# Patient Record
Sex: Female | Born: 1950
Health system: Southern US, Community
[De-identification: ages and names within clinical notes are randomized; demographics above are authoritative.]

## PROBLEM LIST (undated history)

## (undated) DIAGNOSIS — E785 Hyperlipidemia, unspecified: Secondary | ICD-10-CM

## (undated) DIAGNOSIS — Z860100 Personal history of colon polyps, unspecified: Secondary | ICD-10-CM

## (undated) DIAGNOSIS — K56609 Unspecified intestinal obstruction, unspecified as to partial versus complete obstruction: Secondary | ICD-10-CM

## (undated) DIAGNOSIS — Z95810 Presence of automatic (implantable) cardiac defibrillator: Secondary | ICD-10-CM

## (undated) DIAGNOSIS — R0602 Shortness of breath: Secondary | ICD-10-CM

## (undated) DIAGNOSIS — I480 Paroxysmal atrial fibrillation: Secondary | ICD-10-CM

## (undated) DIAGNOSIS — G709 Myoneural disorder, unspecified: Secondary | ICD-10-CM

## (undated) DIAGNOSIS — I10 Essential (primary) hypertension: Secondary | ICD-10-CM

## (undated) DIAGNOSIS — I2510 Atherosclerotic heart disease of native coronary artery without angina pectoris: Secondary | ICD-10-CM

## (undated) DIAGNOSIS — I447 Left bundle-branch block, unspecified: Secondary | ICD-10-CM

## (undated) DIAGNOSIS — I255 Ischemic cardiomyopathy: Secondary | ICD-10-CM

## (undated) DIAGNOSIS — C50912 Malignant neoplasm of unspecified site of left female breast: Secondary | ICD-10-CM

## (undated) DIAGNOSIS — E119 Type 2 diabetes mellitus without complications: Secondary | ICD-10-CM

## (undated) HISTORY — DX: Personal history of colon polyps, unspecified: Z86.0100

## (undated) HISTORY — PX: HERNIA REPAIR: SHX51

## (undated) HISTORY — DX: Type 2 diabetes mellitus without complications: E11.9

## (undated) HISTORY — DX: Personal history of colonic polyps: Z86.010

## (undated) HISTORY — DX: Hyperlipidemia, unspecified: E78.5

## (undated) HISTORY — DX: Myoneural disorder, unspecified: G70.9

## (undated) HISTORY — PX: TENDON REPAIR: SHX5111

## (undated) HISTORY — DX: Essential (primary) hypertension: I10

## (undated) HISTORY — PX: CARDIAC CATHETERIZATION: SHX172

---

## 1972-03-11 HISTORY — PX: CHOLECYSTECTOMY OPEN: SUR202

## 1972-03-11 HISTORY — PX: APPENDECTOMY: SHX54

## 1981-03-11 HISTORY — PX: DILATION AND CURETTAGE OF UTERUS: SHX78

## 1985-03-11 HISTORY — PX: TUBAL LIGATION: SHX77

## 1998-03-11 HISTORY — PX: ABDOMINAL HYSTERECTOMY: SHX81

## 1998-03-11 HISTORY — PX: INCONTINENCE SURGERY: SHX676

## 2000-03-11 DIAGNOSIS — C50912 Malignant neoplasm of unspecified site of left female breast: Secondary | ICD-10-CM

## 2000-03-11 HISTORY — PX: MASTECTOMY: SHX3

## 2000-03-11 HISTORY — DX: Malignant neoplasm of unspecified site of left female breast: C50.912

## 2000-03-11 HISTORY — PX: BREAST BIOPSY: SHX20

## 2000-03-11 HISTORY — PX: MASTECTOMY MODIFIED RADICAL W/ AXILLARY LYMPH NODES W/ OR W/O PECTORALIS MINOR: SUR850

## 2008-08-19 LAB — HM DIABETES FOOT EXAM: HM Diabetic Foot Exam: NORMAL

## 2009-09-19 ENCOUNTER — Ambulatory Visit: Payer: Self-pay | Admitting: Family Medicine

## 2009-09-19 DIAGNOSIS — I10 Essential (primary) hypertension: Secondary | ICD-10-CM | POA: Insufficient documentation

## 2009-09-19 DIAGNOSIS — E785 Hyperlipidemia, unspecified: Secondary | ICD-10-CM | POA: Insufficient documentation

## 2009-09-19 DIAGNOSIS — R51 Headache: Secondary | ICD-10-CM | POA: Insufficient documentation

## 2009-09-19 DIAGNOSIS — Z853 Personal history of malignant neoplasm of breast: Secondary | ICD-10-CM

## 2009-09-19 DIAGNOSIS — I252 Old myocardial infarction: Secondary | ICD-10-CM | POA: Insufficient documentation

## 2009-09-19 DIAGNOSIS — E119 Type 2 diabetes mellitus without complications: Secondary | ICD-10-CM | POA: Insufficient documentation

## 2009-09-19 DIAGNOSIS — R519 Headache, unspecified: Secondary | ICD-10-CM | POA: Insufficient documentation

## 2009-09-19 DIAGNOSIS — J309 Allergic rhinitis, unspecified: Secondary | ICD-10-CM | POA: Insufficient documentation

## 2009-09-19 HISTORY — DX: Personal history of malignant neoplasm of breast: Z85.3

## 2009-09-19 HISTORY — DX: Old myocardial infarction: I25.2

## 2009-09-19 LAB — CONVERTED CEMR LAB
Albumin: 4.1 g/dL (ref 3.5–5.2)
BUN: 10 mg/dL (ref 6–23)
CO2: 32 meq/L (ref 19–32)
Calcium: 10.2 mg/dL (ref 8.4–10.5)
Cholesterol: 231 mg/dL — ABNORMAL HIGH (ref 0–200)
Creatinine, Ser: 0.3 mg/dL — ABNORMAL LOW (ref 0.4–1.2)
Creatinine,U: 27.1 mg/dL
Eosinophils Relative: 0.6 % (ref 0.0–5.0)
GFR calc non Af Amer: 210.52 mL/min (ref >60)
Glucose, Bld: 233 mg/dL — ABNORMAL HIGH (ref 70–99)
HCT: 39.5 % (ref 36.0–46.0)
Hemoglobin: 13.7 g/dL (ref 12.0–15.0)
Hgb A1c MFr Bld: 11.2 % — ABNORMAL HIGH (ref 4.6–6.5)
Lymphs Abs: 2.4 10*3/uL (ref 0.7–4.0)
MCV: 91.2 fL (ref 78.0–100.0)
Microalb, Ur: 1.2 mg/dL (ref 0.0–1.9)
Monocytes Absolute: 0.4 10*3/uL (ref 0.1–1.0)
Monocytes Relative: 4.4 % (ref 3.0–12.0)
Neutro Abs: 6.1 10*3/uL (ref 1.4–7.7)
Platelets: 204 10*3/uL (ref 150.0–400.0)
TSH: 0.67 microintl units/mL (ref 0.35–5.50)
Total CHOL/HDL Ratio: 5
Total Protein: 7.1 g/dL (ref 6.0–8.3)
Triglycerides: 372 mg/dL — ABNORMAL HIGH (ref 0.0–149.0)
WBC: 8.9 10*3/uL (ref 4.5–10.5)

## 2009-09-22 ENCOUNTER — Ambulatory Visit: Payer: Self-pay | Admitting: Family Medicine

## 2009-09-26 ENCOUNTER — Telehealth: Payer: Self-pay | Admitting: Family Medicine

## 2009-10-11 ENCOUNTER — Ambulatory Visit: Payer: Self-pay | Admitting: Family Medicine

## 2009-10-23 ENCOUNTER — Telehealth: Payer: Self-pay | Admitting: Family Medicine

## 2010-04-10 NOTE — Progress Notes (Signed)
Summary: test strips  Phone Note Call from Patient   Summary of Call: patient is calling for a refill of her glucometer test strips. Initial call taken by: Kern Reap CMA (AAMA),  October 23, 2009 11:45 AM    New/Updated Medications: ONETOUCH ULTRA TEST  STRP (GLUCOSE BLOOD) use once daily for glucose control Prescriptions: ONETOUCH ULTRA TEST  STRP (GLUCOSE BLOOD) use once daily for glucose control  #100 x 3   Entered by:   Kern Reap CMA (AAMA)   Authorized by:   Roderick Pee MD   Signed by:   Kern Reap CMA (AAMA) on 10/23/2009   Method used:   [facility]        [facility] [address] [phone]* (retail)       [address]       [place], [place]  [address]       Ph: [phone] or [phone]       Fax: [phone]   RxID:   [id number]

## 2010-04-10 NOTE — Assessment & Plan Note (Signed)
Summary: NEW TO EST//CCM   Vital Signs:  Patient profile:   60 year old female Menstrual status:  hysterectomy Height:      63 inches Weight:      150 pounds BMI:     26.67 Temp:     99.4 degrees F oral BP sitting:   140 / 90 Cuff size:   large  Vitals Entered By: Kern Reap CMA Duncan Dull) (September 19, 2009 2:09 PM) CC: new to establish Is Patient Diabetic? Yes Pain Assessment Patient in pain? no      Comments blood pressure should be taken in leg because of bilateral mascetomy     Menstrual Status hysterectomy   CC:  new to establish.  History of Present Illness: [name] is a 60 year old, married female, smoker, who has underlying coronary disease, MI, 2009 hypertension, diabetes, hyperlipidemia, who comes in today as a new patient for evaluation.  She had a heart attack in 2009 was hospitalized in [place].  She does not recall the details of her treatment, nor her anatomy.  She continues to smoke 10 cigarettes a day, and explained to her she won't reach 61 as she doesn't quit.  We will start her on Entex program today.  Diabetes is treated with glyburide 4 mg b.i.d., metformin 1000 mg b.i.d.  She also takes isosorbide 30 mg q.a.m., will start him 50 mg b.i.d. by systolic 5 mg daily 81-mg baby aspirin, simvastatin, unknown dose, nitroglycerin as needed.  She, states she's not had to use any night.  Her glycerin pain.  She's also had bilateral mastectomies for breast cancer 2002, tubal ligation, ligament surgery in her right leg, TAH and BSO, and a bladder tuck, and appendectomy.  She also has trouble with severe vaginal dryness.  Her blood sugars.  She states averages about 160 to 170 is never been normal.  Her A1c 7.5, plus  Preventive Screening-Counseling & Management  Alcohol-Tobacco     Smoking Status: current     Packs/Day: 0.5  Caffeine-Diet-Exercise     Does Patient Exercise: yes  Hep-HIV-STD-Contraception     Dental Visit-last 6 months no      Drug Use:  no.     Allergies (verified): No Known Drug Allergies  Past History:  Past medical, surgical, family and social histories (including risk factors) reviewed, and no changes noted (except as noted below).  Past Medical History: Allergic rhinitis Breast cancer, hx of Diabetes mellitus, type II Headache Hyperlipidemia Hypertension Myocardial infarction, hx of  Past Surgical History: Mastectomy - bilateral Tubal ligation bladder surgery right leg surgery Appendectomy Cholecystectomy Hysterectomy  Family History: Reviewed history and no changes required. Father: unkown Mother: deceased - kidney failure, heart disease Siblings: hx of - arthritis, colon cancer, ovarian cancer, high cholesterol, HTN, kidney disease, DM  Social History: Reviewed history and no changes required. Occupation: Dispensing optician. Married Current Smoker Alcohol use-no Drug use-no Regular exercise-yes Smoking Status:  current Does Patient Exercise:  yes Drug Use:  no Dental Care w/in 6 mos.:  no Packs/Day:  0.5  Review of Systems      See HPI  Physical Exam  General:  Well-developed,well-nourished,in no acute distress; alert,appropriate and cooperative throughout examination Head:  Normocephalic and atraumatic without obvious abnormalities. No apparent alopecia or balding. Eyes:  No corneal or conjunctival inflammation noted. EOMI. Perrla. Funduscopic exam benign, without hemorrhages, exudates or papilledema. Vision grossly normal. Ears:  External ear exam shows no significant lesions or deformities.  Otoscopic examination reveals clear canals, tympanic membranes are  intact bilaterally without bulging, retraction, inflammation or discharge. Hearing is grossly normal bilaterally. Nose:  External nasal examination shows no deformity or inflammation. Nasal mucosa are pink and moist without lesions or exudates. Mouth:  Oral mucosa and oropharynx without lesions or exudates.  Teeth in good  repair. Neck:  No deformities, masses, or tenderness noted. Chest Wall:  No deformities, masses, or tenderness noted. Breasts:   reconstruction with implants and a tram flap also Lungs:  Normal respiratory effort, chest expands symmetrically. Lungs are clear to auscultation, no crackles or wheezes. Heart:  Normal rate and regular rhythm. S1 and S2 normal without gallop, murmur, click, rub or other extra sounds. Abdomen:  Bowel sounds positive,abdomen soft and non-tender without masses, organomegaly or hernias noted. Rectal:  No external abnormalities noted. Normal sphincter tone. No rectal masses or tenderness. Genitalia:  Pelvic Exam:        External: normal female genitalia without lesions or masses        Vagina: normal without lesions or masses        Cervix: normal without lesions or masses        Adnexa: normal bimanual exam without masses or fullness.........Marland Kitchenextreme vaginal dryness        Uterus: normal by palpation        Pap smear: not performed Msk:  No deformity or scoliosis noted of thoracic or lumbar spine.   Pulses:  R and L carotid,radial,femoral,dorsalis pedis and posterior tibial pulses are full and equal bilaterally Extremities:  No clubbing, cyanosis, edema, or deformity noted with normal full range of motion of all joints.   Neurologic:  No cranial nerve deficits noted. Station and gait are normal. Plantar reflexes are down-going bilaterally. DTRs are symmetrical throughout. Sensory, motor and coordinative functions appear intact. Skin:  Intact without suspicious lesions or rashes Cervical Nodes:  No lymphadenopathy noted Axillary Nodes:  No palpable lymphadenopathy Inguinal Nodes:  No significant adenopathy Psych:  Cognition and judgment appear intact. Alert and cooperative with normal attention span and concentration. No apparent delusions, illusions, hallucinations  Diabetes Management Exam:    Foot Exam (with socks and/or shoes not present):        Sensory-Pinprick/Light touch:          Left medial foot (L-4): normal          Left dorsal foot (L-5): normal          Left lateral foot (S-1): normal          Right medial foot (L-4): normal          Right dorsal foot (L-5): normal          Right lateral foot (S-1): normal       Sensory-Monofilament:          Left foot: normal          Right foot: normal       Inspection:          Left foot: normal          Right foot: normal       Nails:          Left foot: normal          Right foot: normal    Eye Exam:       Eye Exam done elsewhere          Date: 08/19/2008          Results: normal          Done by:  opth   Problems:  Medical Problems Added: 1)  Dx of Tobacco Use  (ICD-305.1) 2)  Dx of Myocardial Infarction, Hx of  (ICD-412) 3)  Dx of Hypertension  (ICD-401.9) 4)  Dx of Hyperlipidemia  (ICD-272.4) 5)  Dx of Headache  (ICD-784.0) 6)  Dx of Diabetes Mellitus, Type II  (ICD-250.00) 7)  Dx of Breast Cancer, Hx of  (ICD-V10.3) 8)  Dx of Allergic Rhinitis  (ICD-477.9)  Impression & Recommendations:  Problem # 1:  TOBACCO USE (ICD-305.1) Assessment New  Orders: Venipuncture (20254) TLB-Lipid Panel (80061-LIPID) TLB-CBC Platelet - w/Differential (85025-CBCD) TLB-BMP (Basic Metabolic Panel-BMET) (80048-METABOL) TLB-Hepatic/Liver Function Pnl (80076-HEPATIC) TLB-TSH (Thyroid Stimulating Hormone) (84443-TSH) TLB-A1C / Hgb A1C (Glycohemoglobin) (83036-A1C) TLB-Microalbumin/Creat Ratio, Urine (82043-MALB) Prescription Created Electronically ([phone])  Her updated medication list for this problem includes:    Chantix Continuing Month Pak 1 Mg Tabs (Varenicline tartrate) ..... Uad  Problem # 2:  MYOCARDIAL INFARCTION, HX OF (ICD-412) Assessment: Unchanged  The following medications were removed from the medication list:    Bystolic 5 Mg Tabs (Nebivolol hcl) .Marland Kitchen... Take one tab by mouth once daily Her updated medication list for this problem includes:    Isosorbide  Mononitrate Cr 30 Mg Xr24h-tab (Isosorbide mononitrate) .Marland Kitchen... Take one tab by mouth in the morning    Losartan Potassium 50 Mg Tabs (Losartan potassium) .Marland Kitchen... Take one tab by mouth two times a day    Aspirin 81 Mg Tbec (Aspirin) .Marland Kitchen... Take one tab by mouth once daily    Nitroglycerin Cr 2.5 Mg Cr-caps (Nitroglycerin) .Marland Kitchen... As needed    Atenolol 25 Mg Tabs (Atenolol) .Marland Kitchen... Take 1 tablet by mouth every morning  Orders: Venipuncture (37628) TLB-Lipid Panel (80061-LIPID) TLB-CBC Platelet - w/Differential (85025-CBCD) TLB-BMP (Basic Metabolic Panel-BMET) (80048-METABOL) TLB-Hepatic/Liver Function Pnl (80076-HEPATIC) TLB-TSH (Thyroid Stimulating Hormone) (84443-TSH) TLB-A1C / Hgb A1C (Glycohemoglobin) (83036-A1C) TLB-Microalbumin/Creat Ratio, Urine (82043-MALB) Prescription Created Electronically (B1517) EKG w/ Interpretation (93000)  Problem # 3:  HYPERTENSION (ICD-401.9) Assessment: Improved  The following medications were removed from the medication list:    Bystolic 5 Mg Tabs (Nebivolol hcl) .Marland Kitchen... Take one tab by mouth once daily Her updated medication list for this problem includes:    Losartan Potassium 50 Mg Tabs (Losartan potassium) .Marland Kitchen... Take one tab by mouth two times a day    Atenolol 25 Mg Tabs (Atenolol) .Marland Kitchen... Take 1 tablet by mouth every morning  Orders: Venipuncture (61607) TLB-Lipid Panel (80061-LIPID) TLB-CBC Platelet - w/Differential (85025-CBCD) TLB-BMP (Basic Metabolic Panel-BMET) (80048-METABOL) TLB-Hepatic/Liver Function Pnl (80076-HEPATIC) TLB-TSH (Thyroid Stimulating Hormone) (84443-TSH) TLB-A1C / Hgb A1C (Glycohemoglobin) (83036-A1C) TLB-Microalbumin/Creat Ratio, Urine (82043-MALB) Prescription Created Electronically (P7106) EKG w/ Interpretation (93000)  Problem # 4:  HYPERLIPIDEMIA (ICD-272.4) Assessment: Improved  The following medications were removed from the medication list:    Simvastatin 40 Mg Tabs (Simvastatin) .Marland Kitchen... Take one tab by mouth once  daily Her updated medication list for this problem includes:    Simvastatin 20 Mg Tabs (Simvastatin) .Marland Kitchen... Take one tab by mouth at bedtime  Orders: Venipuncture (26948) TLB-Lipid Panel (80061-LIPID) TLB-CBC Platelet - w/Differential (85025-CBCD) TLB-BMP (Basic Metabolic Panel-BMET) (80048-METABOL) TLB-Hepatic/Liver Function Pnl (80076-HEPATIC) TLB-TSH (Thyroid Stimulating Hormone) (84443-TSH) TLB-A1C / Hgb A1C (Glycohemoglobin) (83036-A1C) TLB-Microalbumin/Creat Ratio, Urine (82043-MALB) Prescription Created Electronically [phone]) EKG w/ Interpretation (93000)  Problem # 5:  DIABETES MELLITUS, TYPE II (ICD-250.00) Assessment: New  Her updated medication list for this problem includes:    Glimepiride 4 Mg Tabs (Glimepiride) .Marland Kitchen... Take one tab by mouth two times a day    Glucophage 1000  Mg Tabs (Metformin hcl) .Marland Kitchen... Take one tab by mouth two times a day    Losartan Potassium 50 Mg Tabs (Losartan potassium) .Marland Kitchen... Take one tab by mouth two times a day    Aspirin 81 Mg Tbec (Aspirin) .Marland Kitchen... Take one tab by mouth once daily  Orders: Venipuncture (45409) TLB-Lipid Panel (80061-LIPID) TLB-CBC Platelet - w/Differential (85025-CBCD) TLB-BMP (Basic Metabolic Panel-BMET) (80048-METABOL) TLB-Hepatic/Liver Function Pnl (80076-HEPATIC) TLB-TSH (Thyroid Stimulating Hormone) (84443-TSH) TLB-A1C / Hgb A1C (Glycohemoglobin) (83036-A1C) TLB-Microalbumin/Creat Ratio, Urine (82043-MALB) Prescription Created Electronically ([phone])  Complete Medication List: 1)  Glimepiride 4 Mg Tabs (Glimepiride) .... Take one tab by mouth two times a day 2)  Glucophage 1000 Mg Tabs (Metformin hcl) .... Take one tab by mouth two times a day 3)  Isosorbide Mononitrate Cr 30 Mg Xr24h-tab (Isosorbide mononitrate) .... Take one tab by mouth in the morning 4)  Losartan Potassium 50 Mg Tabs (Losartan potassium) .... Take one tab by mouth two times a day 5)  Aspirin 81 Mg Tbec (Aspirin) .... Take one tab by mouth once  daily 6)  Nitroglycerin Cr 2.5 Mg Cr-caps (Nitroglycerin) .... As needed 7)  Simvastatin 20 Mg Tabs (Simvastatin) .... Take one tab by mouth at bedtime 8)  Atenolol 25 Mg Tabs (Atenolol) .... Take 1 tablet by mouth every morning 9)  Chantix Continuing Month Pak 1 Mg Tabs (Varenicline tartrate) .... Uad 10)  Premarin 0.625 Mg/gm Crea (Estrogens, conjugated) .... Apply 2 x week w/o applicator  Other Orders: Tdap => 15yrs IM (47829) Admin 1st Vaccine (56213)  Patient Instructions: 1)  began the chantix  by taking a half a tablet daily, and tapering the cigarettes by two per week.  Follow-up in 4 weeks 2)  Schedule your mammogram. 3)  Schedule a colonoscopy/sigmoidoscopy to help detect colon cancer. 4)  Take calcium +Vitamin D daily. 5)  Take an Aspirin every day. 6)  Check your blood sugars q.a.m.. If your readings are usually above : or below 70 you should contact our office. 7)  It is important that your Diabetic A1c level is checked every 3 months. 8)  See your eye doctor yearly to check for diabetic eye damage. 9)  Check your feet each night for sore areas, calluses or signs of infection. 10)  Check your Blood Pressure regularly. If it is above: you should make an appointment. 11)  and Dr. Vonna Kotyk is an excellent ophthalmologist, Dr. Alvester Morin is an excellent dentist Prescriptions: PREMARIN 0.625 MG/GM CREA (ESTROGENS, CONJUGATED) apply 2 x week w/o applicator  #3 tubes x 5   Entered and Authorized by:   Roderick Pee MD   Signed by:   Roderick Pee MD on 09/19/2009   Method used:   [facility]        [facility] [address] [phone]* (retail)       [address]       [place], [place]  [address]       Ph: [phone] or [phone]       Fax: [phone]   RxID:   [id number] CHANTIX CONTINUING MONTH PAK 1 MG TABS (VARENICLINE TARTRATE) UAD  #1 x 2   Entered and Authorized by:   Roderick Pee MD   Signed by:   Roderick Pee MD on 09/19/2009   Method  used:   Electronically to        [facility] [address] [phone]* (retail)  [address]       [place], [place]  [address]       Ph: [phone] or [phone]       Fax: [phone]   RxID:   [id number] ATENOLOL 25 MG TABS (ATENOLOL) Take 1 tablet by mouth every morning  #100 x 3   Entered and Authorized by:   Roderick Pee MD   Signed by:   Roderick Pee MD on 09/19/2009   Method used:   Electronically to        [facility] [address] [phone]* (retail)       [address]       [place], [place]  [address]       Ph: [phone] or [phone]       Fax: [phone]   RxID:   [id number] SIMVASTATIN 20 MG TABS (SIMVASTATIN) take one tab by mouth at bedtime  #100 x 3   Entered and Authorized by:   Roderick Pee MD   Signed by:   Roderick Pee MD on 09/19/2009   Method used:   Electronically to        [facility] [address] [phone]* (retail)       [address]       [place], [place]  [address]       Ph: [phone] or [phone]       Fax: [phone]   RxID:   [id number] NITROGLYCERIN CR 2.5 MG CR-CAPS (NITROGLYCERIN) as needed  #25 x 1   Entered and Authorized by:   Roderick Pee MD   Signed by:   Roderick Pee MD on 09/19/2009   Method used:   Electronically to        [facility] [address] [phone]* (retail)       [address]       [place], [place]  [address]       Ph: [phone] or [phone]       Fax: [phone]   RxID:   [id number] WPYKDXIP POTASSIUM 50 MG TABS (LOSARTAN POTASSIUM) take one tab by mouth two times a day  #200 x 3   Entered and Authorized by:   Roderick Pee MD   Signed by:   Roderick Pee MD on 09/19/2009   Method used:   Electronically to        [facility] [address] [phone]* (retail)       [address]       [place], [place]  [address]       Ph: [phone] or [phone]       Fax: [phone]    RxID:   [id number] ISOSORBIDE MONONITRATE CR 30 MG XR24H-TAB (ISOSORBIDE MONONITRATE) take one tab by mouth in the morning  #100 x 3   Entered and Authorized by:   Roderick Pee MD   Signed by:   Roderick Pee MD on 09/19/2009   Method used:   Electronically to        [facility] [address] [phone]* (retail)       [address]       [place], [place]  [address]       Ph: [phone] or  [phone]       Fax: [phone]   RxID:   [id number] GLUCOPHAGE 1000 MG TABS (METFORMIN HCL) take one tab by mouth two times a day  #200 x 3   Entered and Authorized by:   Roderick Pee MD   Signed by:   Roderick Pee MD on 09/19/2009   Method used:   Electronically to        [facility] [address] [phone]* (retail)       [address]       [place], [place]  [address]       Ph: [phone] or [phone]       Fax: [phone]   RxID:   [id number] GLIMEPIRIDE 4 MG TABS (GLIMEPIRIDE) take one tab by mouth two times a day  #200 x 3   Entered and Authorized by:   Roderick Pee MD   Signed by:   Roderick Pee MD on 09/19/2009   Method used:   Electronically to        [facility] [address] [phone]* (retail)       [address]       [place], [place]  [address]       Ph: [phone] or [phone]       Fax: [phone]   RxID:   [id number]    Immunizations Administered:  Tetanus Vaccine:    Vaccine Type: Tdap    Site: right deltoid    Mfr: GlaxoSmithKline    Dose: 0.5 ml    Route: IM    Given by: Kern Reap CMA (AAMA)    Exp. Date: 03/30/2011    Lot #: [id number]    Physician counseled: yes

## 2010-04-10 NOTE — Progress Notes (Signed)
Summary: rx concern  Phone Note Call from Patient Call back at Home Phone [phone]   Caller: Patient Summary of Call: patient is calling to find out if she is supposed to continue the metformin as well as the new insuline?  Follow-up for Phone Call        yes............they were in different ways that they work together to get your blood sugar down Follow-up by: Roderick Pee MD,  September 26, 2009 2:17 PM  Additional Follow-up for Phone Call Additional follow up Details #1::        left message on machine for patient  Additional Follow-up by: Kern Reap CMA Duncan Dull),  September 26, 2009 4:46 PM

## 2010-04-10 NOTE — Assessment & Plan Note (Signed)
Summary: FUP PER DR TODD//CCM   Vital Signs:  Patient profile:   60 year old female Menstrual status:  hysterectomy BP sitting:   140 / 90  (left arm) Cuff size:   regular  Vitals Entered By: Kern Reap CMA Duncan Dull) (September 22, 2009 9:02 AM) CC: follow-up visit   CC:  follow-up visit.  History of Present Illness: [name] is a 60 year old, married female, diabetic, on max oral medications, and his hemoglobin A1c is now 11.2, who comes in today for induction of insulin therapy.  Also since her last office visit she stop smoking completely!!!!!!!!!!!!!!!!!.  We reviewed the methodology of injecting insulin.  she gave  herself  10 units of Novolin 70/30 subcutaneous  Preventive Screening-Counseling & Management  Alcohol-Tobacco     Smoking Status: quit     Year Quit: 2011  Allergies: No Known Drug Allergies  Social History: Reviewed history from 09/19/2009 and no changes required. Occupation: Dispensing optician. Married Alcohol use-no Drug use-no Regular exercise-yes Former Smoker Smoking Status:  quit  Review of Systems      See HPI  Physical Exam  General:  Well-developed,well-nourished,in no acute distress; alert,appropriate and cooperative throughout examination   Problems:  Medical Problems Added: 1)  Dx of Diabetes Mellitus Type 1-uncontrolled  (ICD-250.03)  Impression & Recommendations:  Problem # 1:  TOBACCO USE (ICD-305.1) Assessment Improved  Her updated medication list for this problem includes:    Chantix Continuing Month Pak 1 Mg Tabs (Varenicline tartrate) ..... Uad  Problem # 2:  DIABETES MELLITUS TYPE 1-UNCONTROLLED (ICD-250.03) Assessment: New  Her updated medication list for this problem includes:    Glimepiride 4 Mg Tabs (Glimepiride) .Marland Kitchen... Take one tab by mouth two times a day    Glucophage 1000 Mg Tabs (Metformin hcl) .Marland Kitchen... Take one tab by mouth two times a day    Losartan Potassium 50 Mg Tabs (Losartan potassium) .Marland Kitchen... Take one  tab by mouth two times a day    Aspirin 81 Mg Tbec (Aspirin) .Marland Kitchen... Take one tab by mouth once daily  Complete Medication List: 1)  Glimepiride 4 Mg Tabs (Glimepiride) .... Take one tab by mouth two times a day 2)  Glucophage 1000 Mg Tabs (Metformin hcl) .... Take one tab by mouth two times a day 3)  Isosorbide Mononitrate Cr 30 Mg Xr24h-tab (Isosorbide mononitrate) .... Take one tab by mouth in the morning 4)  Losartan Potassium 50 Mg Tabs (Losartan potassium) .... Take one tab by mouth two times a day 5)  Aspirin 81 Mg Tbec (Aspirin) .... Take one tab by mouth once daily 6)  Nitroglycerin Cr 2.5 Mg Cr-caps (Nitroglycerin) .... As needed 7)  Simvastatin 20 Mg Tabs (Simvastatin) .... Take one tab by mouth at bedtime 8)  Atenolol 25 Mg Tabs (Atenolol) .... Take 1 tablet by mouth every morning 9)  Chantix Continuing Month Pak 1 Mg Tabs (Varenicline tartrate) .... Uad 10)  Premarin 0.625 Mg/gm Crea (Estrogens, conjugated) .... Apply 2 x week w/o applicator 11)  Onetouch Ultra System W/device Kit (Blood glucose monitoring suppl)  Patient Instructions: 1)  begin 10 units of NovoLog 70 -- 30 at bedtime.  Check a fasting blood sugar every morning.  Return the first week in August for follow-up.  When u  returned to bring a record of all your blood sugar readings

## 2010-04-10 NOTE — Assessment & Plan Note (Signed)
Summary: 1 month rov.njr/PT RESCD //CCM   Vital Signs:  Patient profile:   60 year old female Menstrual status:  hysterectomy Weight:      150 pounds Temp:     98.7 degrees F oral BP sitting:   120 / 80 Cuff size:   regular  Vitals Entered By: Kern Reap CMA Duncan Dull) (October 11, 2009 8:21 AM) CC: follow-up visit Is Patient Diabetic? Yes   CC:  follow-up visit.  History of Present Illness: [name] is a 60 year old, married female, ex-smoker x 1 month, who comes in today for follow-up of diabetes, type I.  She is evolved into a type I diabetic.  She is currently taking NovoLog 70 -- 30 dose 10 units nightly.  We started this 3 weeks ago.  Her blood sugar has come down, but not normalize.  Lowest blood sugar 126.  She is monitoring her diet carefully.  She is walking on a daily basis.  BP 120/80.  Again, she never got the chantix filled...  She  quit smoking cold [place]  Allergies: No Known Drug Allergies  Social History: Reviewed history from 09/22/2009 and no changes required. Occupation: Dispensing optician. Married Alcohol use-no Drug use-no Regular exercise-yes Former Smoker  Review of Systems      See HPI  Physical Exam  General:  Well-developed,well-nourished,in no acute distress; alert,appropriate and cooperative throughout examination   Impression & Recommendations:  Problem # 1:  DIABETES MELLITUS TYPE 1-UNCONTROLLED (ICD-250.03) Assessment Improved  Her updated medication list for this problem includes:    Glimepiride 4 Mg Tabs (Glimepiride) .Marland Kitchen... Take one tab by mouth two times a day    Glucophage 1000 Mg Tabs (Metformin hcl) .Marland Kitchen... Take one tab by mouth two times a day    Losartan Potassium 50 Mg Tabs (Losartan potassium) .Marland Kitchen... Take one tab by mouth two times a day    Aspirin 81 Mg Tbec (Aspirin) .Marland Kitchen... Take one tab by mouth once daily    Novolog Mix 70/30 Flexpen 70-30 % Susp (Insulin aspart prot & aspart) .Marland KitchenMarland KitchenMarland KitchenMarland Kitchen 15 u at  bedtime  Orders: Prescription Created Electronically [phone])  Complete Medication List: 1)  Glimepiride 4 Mg Tabs (Glimepiride) .... Take one tab by mouth two times a day 2)  Glucophage 1000 Mg Tabs (Metformin hcl) .... Take one tab by mouth two times a day 3)  Isosorbide Mononitrate Cr 30 Mg Xr24h-tab (Isosorbide mononitrate) .... Take one tab by mouth in the morning 4)  Losartan Potassium 50 Mg Tabs (Losartan potassium) .... Take one tab by mouth two times a day 5)  Aspirin 81 Mg Tbec (Aspirin) .... Take one tab by mouth once daily 6)  Nitroglycerin Cr 2.5 Mg Cr-caps (Nitroglycerin) .... As needed 7)  Simvastatin 20 Mg Tabs (Simvastatin) .... Take one tab by mouth at bedtime 8)  Atenolol 25 Mg Tabs (Atenolol) .... Take 1 tablet by mouth every morning 9)  Chantix Continuing Month Pak 1 Mg Tabs (Varenicline tartrate) .... Uad 10)  Premarin 0.625 Mg/gm Crea (Estrogens, conjugated) .... Apply 2 x week w/o applicator 11)  Onetouch Ultra System W/device Kit (Blood glucose monitoring suppl) 12)  Novolog Mix 70/30 Flexpen 70-30 % Susp (Insulin aspart prot & aspart) .Marland Kitchen.. 15 u at bedtime  Patient Instructions: 1)  increase the insulin to 15 units nightly if after two weeks y blood sugar drops to 100 continue that dose.  If not increased to 20 units nightly.  In other words, increase by 5 units every two weeks until your  fasting blood sugar in the morning drops to 100.  Return in one month for follow up. 2)  Also monitor your blood pressure at home with a digital blood pressure cuff.  Check your blood pressure Monday, Wednesday, Friday Prescriptions: NOVOLOG MIX 70/30 FLEXPEN 70-30 % SUSP (INSULIN ASPART PROT & ASPART) 15 u at bedtime  #2 x 6   Entered and Authorized by:   Roderick Pee MD   Signed by:   Roderick Pee MD on 10/11/2009   Method used:   Electronically to        [facility] [address] [phone]* (retail)       [address]       [place], [place]  [address]        Ph: [phone] or [phone]       Fax: [phone]   RxID:   [id number]

## 2010-04-16 LAB — HM DIABETES EYE EXAM

## 2010-04-19 ENCOUNTER — Encounter: Payer: Self-pay | Admitting: Family Medicine

## 2010-07-16 ENCOUNTER — Ambulatory Visit: Payer: Self-pay | Admitting: Family Medicine

## 2010-10-23 ENCOUNTER — Other Ambulatory Visit: Payer: Self-pay | Admitting: Family Medicine

## 2010-11-23 ENCOUNTER — Other Ambulatory Visit: Payer: Self-pay | Admitting: Family Medicine

## 2010-11-29 ENCOUNTER — Encounter: Payer: Self-pay | Admitting: Family Medicine

## 2010-12-19 ENCOUNTER — Encounter: Payer: Self-pay | Admitting: Family Medicine

## 2010-12-19 ENCOUNTER — Ambulatory Visit (INDEPENDENT_AMBULATORY_CARE_PROVIDER_SITE_OTHER): Payer: PRIVATE HEALTH INSURANCE | Admitting: Family Medicine

## 2010-12-19 DIAGNOSIS — I259 Chronic ischemic heart disease, unspecified: Secondary | ICD-10-CM

## 2010-12-19 DIAGNOSIS — Z23 Encounter for immunization: Secondary | ICD-10-CM

## 2010-12-19 DIAGNOSIS — E785 Hyperlipidemia, unspecified: Secondary | ICD-10-CM

## 2010-12-19 DIAGNOSIS — Z0000 Encounter for general adult medical examination without abnormal findings: Secondary | ICD-10-CM

## 2010-12-19 DIAGNOSIS — Z853 Personal history of malignant neoplasm of breast: Secondary | ICD-10-CM

## 2010-12-19 DIAGNOSIS — I2510 Atherosclerotic heart disease of native coronary artery without angina pectoris: Secondary | ICD-10-CM

## 2010-12-19 DIAGNOSIS — E1065 Type 1 diabetes mellitus with hyperglycemia: Secondary | ICD-10-CM

## 2010-12-19 DIAGNOSIS — I10 Essential (primary) hypertension: Secondary | ICD-10-CM

## 2010-12-19 DIAGNOSIS — IMO0002 Reserved for concepts with insufficient information to code with codable children: Secondary | ICD-10-CM

## 2010-12-19 LAB — CBC WITH DIFFERENTIAL/PLATELET
Basophils Absolute: 0 10*3/uL (ref 0.0–0.1)
HCT: 41.3 % (ref 36.0–46.0)
Lymphs Abs: 2.2 10*3/uL (ref 0.7–4.0)
Monocytes Absolute: 0.4 10*3/uL (ref 0.1–1.0)
Monocytes Relative: 3.8 % (ref 3.0–12.0)
Neutrophils Relative %: 72.3 % (ref 43.0–77.0)
Platelets: 232 10*3/uL (ref 150.0–400.0)
RDW: 12.9 % (ref 11.5–14.6)

## 2010-12-19 LAB — LIPID PANEL
Cholesterol: 198 mg/dL (ref 0–200)
Triglycerides: 136 mg/dL (ref 0.0–149.0)
VLDL: 27.2 mg/dL (ref 0.0–40.0)

## 2010-12-19 LAB — HEPATIC FUNCTION PANEL
ALT: 28 U/L (ref 0–35)
AST: 22 U/L (ref 0–37)
Albumin: 4.4 g/dL (ref 3.5–5.2)
Alkaline Phosphatase: 66 U/L (ref 39–117)
Bilirubin, Direct: 0 mg/dL (ref 0.0–0.3)
Total Protein: 7.6 g/dL (ref 6.0–8.3)

## 2010-12-19 LAB — BASIC METABOLIC PANEL
BUN: 7 mg/dL (ref 6–23)
Creatinine, Ser: 0.5 mg/dL (ref 0.4–1.2)
GFR: 134.31 mL/min (ref >60.00)
Glucose, Bld: 208 mg/dL — ABNORMAL HIGH (ref 70–99)
Potassium: 4.8 mEq/L (ref 3.5–5.1)

## 2010-12-19 LAB — POCT URINALYSIS DIPSTICK
Bilirubin, UA: NEGATIVE
Blood, UA: NEGATIVE
Spec Grav, UA: NEGATIVE
pH, UA: 6

## 2010-12-19 MED ORDER — METFORMIN HCL 1000 MG PO TABS: 1000.0000 mg | ORAL_TABLET | Freq: Two times a day (BID) | ORAL | Status: DC

## 2010-12-19 MED ORDER — ATENOLOL 25 MG PO TABS: 25.0000 mg | ORAL_TABLET | Freq: Every day | ORAL | Status: DC

## 2010-12-19 MED ORDER — LOSARTAN POTASSIUM 50 MG PO TABS: ORAL_TABLET | ORAL | Status: DC

## 2010-12-19 MED ORDER — NITROGLYCERIN ER 2.5 MG PO CPCR: 2.5000 mg | ORAL_CAPSULE | ORAL | Status: DC | PRN

## 2010-12-19 MED ORDER — INSULIN ASPART PROT & ASPART (70-30 MIX) 100 UNIT/ML ~~LOC~~ SUSP: 20.0000 [IU] | Freq: Every day | SUBCUTANEOUS | Status: DC

## 2010-12-19 MED ORDER — ESTROGENS, CONJUGATED 0.625 MG/GM VA CREA: 0.5000 g | TOPICAL_CREAM | Freq: Every day | VAGINAL | Status: DC

## 2010-12-19 MED ORDER — SIMVASTATIN 20 MG PO TABS: 20.0000 mg | ORAL_TABLET | Freq: Every day | ORAL | Status: DC

## 2010-12-19 MED ORDER — ISOSORBIDE MONONITRATE ER 30 MG PO TB24: 30.0000 mg | ORAL_TABLET | Freq: Every day | ORAL | Status: DC

## 2010-12-19 NOTE — Patient Instructions (Signed)
Stop the Amaryl.  Increase your insulin to 20 units daily, and continue the metformin 1000 mg b.i.d.  Check a fasting blood sugar daily.  Return in two weeks for follow-up.  Continue your other medications.  Call Dr. Gweneth Dimitri to set up an eye exam.  Call to get set up for your mammogram.  Remember to do a thorough breast exam monthly.  Use small amounts of the Premarin vaginal cream twice weekly for 3 months, then once weekly.  We will also do to set up for screening colonoscopy.  Call and get her cardiac records from [place].

## 2010-12-19 NOTE — Progress Notes (Signed)
Subjective:    Patient ID: [name], female    DOB: 1951-02-23, 60 y.o.   MRN: [id number]  HPIconselo is a 60 year old, married female Nonsmoker........ She smells of smoke and states that her husband smokes....... Who comes in today for physical examination because of a history of hypertension, diabetes, type I uncontrolled, hyperlipidemia, history of coronary artery disease, history of bilateral breast cancer, and numerous other issues.  For hypertension.  She takes Tenormin 25 mg daily, losartan 50 mg daily, BP 122/80.  Her diabetes.  She takes metformin 1000 mg b.i.d., Amaryl 4 mg daily, however, she's noticed the past couple months.  Her blood sugars up to 190 range.  Labs pending. She also takes NovoLog 70 3010 units nightly  About 8 years ago in [place].  She had a heart attack.  She states she has an angiogram, but does not recall the details.  Cardiovascular-wise, she is asymptomatic.  I explained to her that she is at high risk to have recurrent coronary disease.  Because of her diabetes and exposure to secondary smoke.  I asked her to get her medical records from [place], ASAP.  For hyperlipidemia.  She takes Zocor 20 nightly along with an aspirin tablet.  Check lipid panel today.  She also takes Imdur 30 mg daily.  In 2002.  She had a bilateral mastectomy subsequent tram flap.  It get infected.  She had bilateral implants.  Last mammogram two years ago.  Encouraged in her mammography.  She's not had a recent eye exam referred to Dr. Vonna Kotyk.  She is regular dental care, does not check her breasts on a monthly basis.  Encouraged her to do so and again mammography.  Tetanus 2011, seasonal flu shot today, she's never had a colonoscopy.  Will set her up for a screening colonoscopy.  This winter.      Review of Systems  Constitutional: Negative.   HENT: Negative.   Eyes: Negative.   Respiratory: Negative.   Cardiovascular: Negative.   Gastrointestinal: Negative.     Genitourinary: Negative.   Musculoskeletal: Negative.   Neurological: Negative.   Hematological: Negative.   Psychiatric/Behavioral: Negative.        Objective:   Physical Exam  Constitutional: She appears well-developed and well-nourished.  HENT:  Head: Normocephalic and atraumatic.  Right Ear: External ear normal.  Left Ear: External ear normal.  Nose: Nose normal.  Mouth/Throat: Oropharynx is clear and moist.  Eyes: EOM are normal. Pupils are equal, round, and reactive to light.  Neck: Normal range of motion. Neck supple. No thyromegaly present.  Cardiovascular: Normal rate, regular rhythm, normal heart sounds and intact distal pulses.  Exam reveals no gallop and no friction rub.   No murmur heard. Pulmonary/Chest: Effort normal and breath sounds normal.  Abdominal: Soft. Bowel sounds are normal. She exhibits no distension and no mass. There is no tenderness. There is no rebound.       Scars on the abdomen from previous tram flap and right flank  Genitourinary: Vagina normal. Guaiac negative stool. No vaginal discharge found.       Bilateral breast exam shows the implants are stable.  No palpable masses.  The vagina is extremely dry  Musculoskeletal: Normal range of motion.  Lymphadenopathy:    She has no cervical adenopathy.  Neurological: She is alert. She has normal reflexes. No cranial nerve deficit. She exhibits normal muscle tone. Coordination normal.  Skin: Skin is warm and dry.  Psychiatric: She has a normal mood and affect.  Her behavior is normal. Judgment and thought content normal.          Assessment & Plan:  Healthy female.  Diabetes type I, not at goal.  Plan DC Amaryl,,,,,,,,, increase insulin by 5 units every two weeks until fasting sugar drops to 100.  Hypertension.  Continue Tenormin and Cozaar.  Hyperlipidemia.  Continue Zocor and aspirin.  History of coronary disease.  Continue Imdur 30 mg daily get records from previous MI.  Status post  breast cancer with implants.  Annual mammography.  Vaginal dryness, term and vaginal cream twice weekly.  Schedule colonoscopy and she is to call Dr. Earlene Plater began exam because of her diabetes

## 2011-01-03 ENCOUNTER — Ambulatory Visit (INDEPENDENT_AMBULATORY_CARE_PROVIDER_SITE_OTHER): Payer: PRIVATE HEALTH INSURANCE | Admitting: Family Medicine

## 2011-01-03 ENCOUNTER — Encounter: Payer: Self-pay | Admitting: Gastroenterology

## 2011-01-03 ENCOUNTER — Encounter: Payer: Self-pay | Admitting: Family Medicine

## 2011-01-03 DIAGNOSIS — E1065 Type 1 diabetes mellitus with hyperglycemia: Secondary | ICD-10-CM

## 2011-01-03 DIAGNOSIS — IMO0002 Reserved for concepts with insufficient information to code with codable children: Secondary | ICD-10-CM

## 2011-01-03 NOTE — Patient Instructions (Signed)
Increase your insulin by 5 units every week and to your blood sugar drops hundred.  Return in two weeks for follow-up with a record of all your daily.  Blood sugar checks.  Remember at to continue to follow her diet and to walk 30 minutes daily.  Consult with Judithe Modest

## 2011-01-03 NOTE — Progress Notes (Signed)
  Subjective:    Patient ID: [name], female    DOB: 12/01/1950, 60 y.o.   MRN: [id number]  HPI C. Is a delightful, 60 year old female, married, nonsmoker, who comes in today for follow-up of diabetes, type I.  She was seen two weeks ago for general checkup.  At that time.  She states her blood sugars were in the 150 range however.  A1c is 9.4%.  We talked about the importance of diet, exercise, and we will have to increase her insulin.  By 5 units every week until sugar drops, 200  She also has a number of symptoms related to anxiety and family problems.  Referred to Judithe Modest   Review of Systems    General and metabolic review of systems otherwise negative Objective:   Physical Exam  A well-developed, well-nourished, female, in no acute distress      Assessment & Plan:  Diabetes type 2, not at goal.  Plan increase insulin by 5 units every week until blood sugar dropped to 100 follow-up in two weeks

## 2011-01-17 ENCOUNTER — Ambulatory Visit: Payer: PRIVATE HEALTH INSURANCE | Admitting: Family Medicine

## 2011-01-21 ENCOUNTER — Ambulatory Visit (AMBULATORY_SURGERY_CENTER): Payer: PRIVATE HEALTH INSURANCE | Admitting: *Deleted

## 2011-01-21 VITALS — Ht 64.5 in | Wt 159.0 lb

## 2011-01-21 DIAGNOSIS — Z1211 Encounter for screening for malignant neoplasm of colon: Secondary | ICD-10-CM

## 2011-01-21 MED ORDER — PEG-KCL-NACL-NASULF-NA ASC-C 100 G PO SOLR: ORAL | Status: DC

## 2011-01-22 ENCOUNTER — Ambulatory Visit (INDEPENDENT_AMBULATORY_CARE_PROVIDER_SITE_OTHER): Payer: PRIVATE HEALTH INSURANCE | Admitting: Family Medicine

## 2011-01-22 ENCOUNTER — Encounter: Payer: Self-pay | Admitting: Family Medicine

## 2011-01-22 VITALS — BP 130/80 | Temp 99.7°F | Wt 154.0 lb

## 2011-01-22 DIAGNOSIS — A630 Anogenital (venereal) warts: Secondary | ICD-10-CM

## 2011-01-22 HISTORY — DX: Anogenital (venereal) warts: A63.0

## 2011-01-22 NOTE — Progress Notes (Signed)
  Subjective:    Patient ID: [name], female    DOB: 23-Apr-1950, 60 y.o.   MRN: [id number]  HPI C. Is a 60 year old female type I diabetic who comes in today for treatment of anogenital warts.  She noticed a couple weeks ago.  Some lumps around the rectum and looked with a mirror and thought she might have skin tags.  However, on physical examination, there warts.  They were treated with TCA.  She tolerated the procedure.  No complications  Review of Systems    General and dermatologic review of systems otherwise negative Objective:   Physical Exam  Well-developed well-nourished, female, in no acute distress.  Examination of the rectum shows multiple warts      Assessment & Plan:  Anogenital warts treated with TCA.  Return in two weeks p.r.n.

## 2011-01-22 NOTE — Patient Instructions (Signed)
Return in two weeks for follow-up, sooner if any problem

## 2011-01-24 ENCOUNTER — Telehealth: Payer: Self-pay | Admitting: Gastroenterology

## 2011-01-24 NOTE — Telephone Encounter (Signed)
I called [name] and verified that her Rx for Movi Prep had gone through electronically on Monday 01/21/11.  Message left on pts. Phone.  Wyona Almas

## 2011-02-04 ENCOUNTER — Ambulatory Visit (AMBULATORY_SURGERY_CENTER): Payer: PRIVATE HEALTH INSURANCE | Admitting: Gastroenterology

## 2011-02-04 ENCOUNTER — Other Ambulatory Visit: Payer: Self-pay | Admitting: Gastroenterology

## 2011-02-04 ENCOUNTER — Encounter: Payer: Self-pay | Admitting: Gastroenterology

## 2011-02-04 VITALS — BP 134/69 | HR 77 | Temp 97.0°F | Resp 20 | Ht 64.0 in | Wt 159.0 lb

## 2011-02-04 DIAGNOSIS — D126 Benign neoplasm of colon, unspecified: Secondary | ICD-10-CM

## 2011-02-04 DIAGNOSIS — K5730 Diverticulosis of large intestine without perforation or abscess without bleeding: Secondary | ICD-10-CM

## 2011-02-04 DIAGNOSIS — Z1211 Encounter for screening for malignant neoplasm of colon: Secondary | ICD-10-CM

## 2011-02-04 DIAGNOSIS — Z800 Family history of malignant neoplasm of digestive organs: Secondary | ICD-10-CM

## 2011-02-04 DIAGNOSIS — K648 Other hemorrhoids: Secondary | ICD-10-CM

## 2011-02-04 MED ORDER — SODIUM CHLORIDE 0.9 % IV SOLN: 500.0000 mL | INTRAVENOUS | Status: DC

## 2011-02-04 NOTE — Progress Notes (Signed)
Patient did not experience any of the following events: a burn prior to discharge; a fall within the facility; wrong site/side/patient/procedure/implant event; or a hospital transfer or hospital admission upon discharge from the facility. (G8907) Patient did not have preoperative order for IV antibiotic SSI prophylaxis. (G8918)  

## 2011-02-04 NOTE — Progress Notes (Signed)
BP's running 70's/50's on withdrawal of scope, all other vss, pt arousable with stimulation. Readjusted cuff that is on right ankle

## 2011-02-04 NOTE — Patient Instructions (Addendum)
Diverticulosis Diverticulosis is a common condition that develops when small pouches (diverticula) form in the wall of the colon. The risk of diverticulosis increases with age. It happens more often in people who eat a low-fiber diet. Most individuals with diverticulosis have no symptoms. Those individuals with symptoms usually experience abdominal pain, constipation, or loose stools (diarrhea). HOME CARE INSTRUCTIONS   Increase the amount of fiber in your diet as directed by your caregiver or dietician. This may reduce symptoms of diverticulosis.   Your caregiver may recommend taking a dietary fiber supplement.   Drink at least 6 to 8 glasses of water each day to prevent constipation.   Try not to strain when you have a bowel movement.   Your caregiver may recommend avoiding nuts and seeds to prevent complications, although this is still an uncertain benefit.   Only take over-the-counter or prescription medicines for pain, discomfort, or fever as directed by your caregiver.  FOODS WITH HIGH FIBER CONTENT INCLUDE:  Fruits. Apple, peach, pear, tangerine, raisins, prunes.   Vegetables. Brussels sprouts, asparagus, broccoli, cabbage, carrot, cauliflower, romaine lettuce, spinach, summer squash, tomato, winter squash, zucchini.   Starchy Vegetables. Baked beans, kidney beans, lima beans, split peas, lentils, potatoes (with skin).   Grains. Whole wheat bread, brown rice, bran flake cereal, plain oatmeal, white rice, shredded wheat, bran muffins.  SEEK IMMEDIATE MEDICAL CARE IF:   You develop increasing pain or severe bloating.   You have an oral temperature above 102 F (38.9 C), not controlled by medicine.   You develop vomiting or bowel movements that are bloody or black.  Document Released: 11/23/2003 Document Revised: 11/07/2010 Document Reviewed: 07/26/2009 [employer] Patient Information 2012 [employer]  .Colon Polyps A polyp is extra tissue that grows inside your body. Colon  polyps grow in the large intestine. The large intestine, also called the colon, is part of your digestive system. It is a long, hollow tube at the end of your digestive tract where your body makes and stores stool. Most polyps are not dangerous. They are benign. This means they are not cancerous. But over time, some types of polyps can turn into cancer. Polyps that are smaller than a pea are usually not harmful. But larger polyps could someday become or may already be cancerous. To be safe, doctors remove all polyps and test them.  WHO GETS POLYPS? Anyone can get polyps, but certain people are more likely than others. You may have a greater chance of getting polyps if:  You are over 50.   You have had polyps before.   Someone in your family has had polyps.   Someone in your family has had cancer of the large intestine.   Find out if someone in your family has had polyps. You may also be more likely to get polyps if you:   Eat a lot of fatty foods.   Smoke.   Drink alcohol.   Do not exercise.   Eat too much.  SYMPTOMS  Most small polyps do not cause symptoms. People often do not know they have one until their caregiver finds it during a regular checkup or while testing them for something else. Some people do have symptoms like these:  Bleeding from the anus. You might notice blood on your underwear or on toilet paper after you have had a bowel movement.   Constipation or diarrhea that lasts more than a week.   Blood in the stool. Blood can make stool look black or it can show up   as red streaks in the stool.  If you have any of these symptoms, see your caregiver. HOW DOES THE DOCTOR TEST FOR POLYPS? The doctor can use four tests to check for polyps:  Digital rectal exam. The caregiver wears gloves and checks your rectum (the last part of the large intestine) to see if it feels normal. This test would find polyps only in the rectum. Your caregiver may need to do one of the other  tests listed below to find polyps higher up in the intestine.   Barium enema. The caregiver puts a liquid called barium into your rectum before taking x-rays of your large intestine. Barium makes your intestine look white in the pictures. Polyps are dark, so they are easy to see.   Sigmoidoscopy. With this test, the caregiver can see inside your large intestine. A thin flexible tube is placed into your rectum. The device is called a sigmoidoscope, which has a light and a tiny video camera in it. The caregiver uses the sigmoidoscope to look at the last third of your large intestine.   Colonoscopy. This test is like sigmoidoscopy, but the caregiver looks at all of the large intestine. It usually requires sedation. This is the most common method for finding and removing polyps.  TREATMENT   The caregiver will remove the polyp during sigmoidoscopy or colonoscopy. The polyp is then tested for cancer.   If you have had polyps, your caregiver may want you to get tested regularly in the future.  PREVENTION  There is not one sure way to prevent polyps. You might be able to lower your risk of getting them if you:  Eat more fruits and vegetables and less fatty food.   Do not smoke.   Avoid alcohol.   Exercise every day.   Lose weight if you are overweight.   Eating more calcium and folate can also lower your risk of getting polyps. Some foods that are rich in calcium are milk, cheese, and broccoli. Some foods that are rich in folate are chickpeas, kidney beans, and spinach.   Aspirin might help prevent polyps. Studies are under way.  Document Released: 11/22/2003 Document Revised: 11/07/2010 Document Reviewed: 04/29/2007 [facility] Patient Information 2012 [place], [place].  SEE BLUE AND GREEN SHEETS FOR ADDITIONAL D/C INSTRUCTIONS

## 2011-02-05 ENCOUNTER — Telehealth: Payer: Self-pay | Admitting: *Deleted

## 2011-02-05 NOTE — Telephone Encounter (Signed)
No answer, message left

## 2011-02-12 ENCOUNTER — Telehealth: Payer: Self-pay | Admitting: Family Medicine

## 2011-02-12 NOTE — Telephone Encounter (Signed)
Pt has questions about insulin injections. Pt requesting you contact her

## 2011-02-13 NOTE — Telephone Encounter (Signed)
Left message on machine returning patient's call 

## 2011-02-14 ENCOUNTER — Telehealth: Payer: Self-pay | Admitting: Family Medicine

## 2011-02-14 NOTE — Telephone Encounter (Signed)
Pt called and is req to get samples of insulin aspart protamine-insulin aspart (NOVOLOG 70/30) (70-30) 100 UNIT/ML injection flexpen? Pt says that she has insurance is expensive. If there aren't any samples, that's ok. Pt would like to remain on this insulin, because it works great.

## 2011-02-15 NOTE — Telephone Encounter (Signed)
Left message on machine for patient

## 2011-02-21 ENCOUNTER — Encounter: Payer: Self-pay | Admitting: Family Medicine

## 2011-02-21 ENCOUNTER — Ambulatory Visit (INDEPENDENT_AMBULATORY_CARE_PROVIDER_SITE_OTHER): Payer: PRIVATE HEALTH INSURANCE | Admitting: Family Medicine

## 2011-02-21 DIAGNOSIS — A630 Anogenital (venereal) warts: Secondary | ICD-10-CM

## 2011-02-21 NOTE — Progress Notes (Signed)
  Subjective:    Patient ID: [name], female    DOB: 01-12-1951, 60 y.o.   MRN: [id number]  HPI C. Is a 60 year old female, who comes in today for treatment of anal warts.  We first treated here about 59 month ago she noticed about a 50% diminishing of the lesions.  She smells of smoke, although she states she does not smoke.  She states her husband.  Smokes two packs of cigarettes a day.  He has decided he would like to return in start the chantix program   Review of Systems    General and dermatologic review of systems otherwise negative Objective:   Physical Exam Well-developed well-nourished, female, in no acute distress.  Examination of rectum shows multiple small warts there about 50% small.  There were a month ago.  They were painted with 80% TCA.  Examination the vagina shows no other lesions in the vaginal area       Assessment & Plan:  In a warts.  Plan return p.r.n.

## 2011-02-21 NOTE — Patient Instructions (Signed)
Return in one month if the lesions have not completely resolved

## 2011-03-19 ENCOUNTER — Telehealth: Payer: Self-pay | Admitting: Family Medicine

## 2011-03-19 NOTE — Telephone Encounter (Signed)
Pt req to get samples of Insulin 70-30 for pick up tomorrow. Pls call when ready for pick up.

## 2011-03-20 NOTE — Telephone Encounter (Signed)
Spoke with patient.

## 2011-03-22 ENCOUNTER — Telehealth: Payer: Self-pay | Admitting: Family Medicine

## 2011-03-22 NOTE — Telephone Encounter (Signed)
Pt would like you to call her back about the Rx she picked up yesterday - her insulin.

## 2011-03-22 NOTE — Telephone Encounter (Signed)
Spoke with patient.

## 2011-04-10 ENCOUNTER — Other Ambulatory Visit: Payer: Self-pay | Admitting: Family Medicine

## 2011-04-10 NOTE — Telephone Encounter (Signed)
Samples ready for pick up

## 2011-04-10 NOTE — Telephone Encounter (Signed)
Pt needs samples of novolog 70/30 °

## 2011-04-26 ENCOUNTER — Other Ambulatory Visit: Payer: Self-pay | Admitting: Family Medicine

## 2011-04-26 NOTE — Telephone Encounter (Signed)
Pt would like samples of novolog 70/30

## 2011-05-01 NOTE — Telephone Encounter (Signed)
Left message on machine.

## 2011-06-05 ENCOUNTER — Telehealth: Payer: Self-pay | Admitting: Family Medicine

## 2011-06-05 NOTE — Telephone Encounter (Signed)
Patient called requesting samples of novolog 70/30 flex pen. Please assist.

## 2011-06-05 NOTE — Telephone Encounter (Signed)
Samples ready for pick up

## 2011-08-06 ENCOUNTER — Other Ambulatory Visit: Payer: Self-pay | Admitting: *Deleted

## 2011-08-06 DIAGNOSIS — I2510 Atherosclerotic heart disease of native coronary artery without angina pectoris: Secondary | ICD-10-CM

## 2011-08-06 MED ORDER — ISOSORBIDE MONONITRATE ER 30 MG PO TB24: 30.0000 mg | ORAL_TABLET | Freq: Every day | ORAL | Status: DC

## 2011-09-10 ENCOUNTER — Other Ambulatory Visit: Payer: Self-pay | Admitting: *Deleted

## 2011-09-10 MED ORDER — GLIMEPIRIDE 4 MG PO TABS: 4.0000 mg | ORAL_TABLET | Freq: Two times a day (BID) | ORAL | Status: DC

## 2011-10-18 ENCOUNTER — Other Ambulatory Visit: Payer: Self-pay | Admitting: Family Medicine

## 2011-10-24 ENCOUNTER — Ambulatory Visit: Payer: PRIVATE HEALTH INSURANCE | Admitting: Family Medicine

## 2011-12-17 ENCOUNTER — Other Ambulatory Visit: Payer: Self-pay | Admitting: Family Medicine

## 2012-02-10 ENCOUNTER — Other Ambulatory Visit: Payer: Self-pay | Admitting: Family Medicine

## 2012-04-06 ENCOUNTER — Other Ambulatory Visit: Payer: Self-pay | Admitting: Family Medicine

## 2012-04-10 ENCOUNTER — Other Ambulatory Visit: Payer: Self-pay | Admitting: Family Medicine

## 2012-05-17 ENCOUNTER — Other Ambulatory Visit: Payer: Self-pay | Admitting: Family Medicine

## 2012-05-27 ENCOUNTER — Encounter: Payer: PRIVATE HEALTH INSURANCE | Admitting: Family Medicine

## 2012-07-17 ENCOUNTER — Other Ambulatory Visit: Payer: Self-pay

## 2012-07-17 ENCOUNTER — Inpatient Hospital Stay (HOSPITAL_COMMUNITY)
Admission: EM | Admit: 2012-07-17 | Discharge: 2012-07-21 | DRG: 125 | Disposition: A | Discharge status: Home / Self Care | Payer: BC Managed Care – PPO | Attending: Cardiology | Admitting: Cardiology

## 2012-07-17 ENCOUNTER — Encounter (HOSPITAL_COMMUNITY): Payer: Self-pay | Admitting: Adult Health

## 2012-07-17 ENCOUNTER — Emergency Department (HOSPITAL_COMMUNITY): Payer: BC Managed Care – PPO

## 2012-07-17 DIAGNOSIS — I252 Old myocardial infarction: Secondary | ICD-10-CM

## 2012-07-17 DIAGNOSIS — Z87891 Personal history of nicotine dependence: Secondary | ICD-10-CM

## 2012-07-17 DIAGNOSIS — Z79899 Other long term (current) drug therapy: Secondary | ICD-10-CM

## 2012-07-17 DIAGNOSIS — I4891 Unspecified atrial fibrillation: Principal | ICD-10-CM | POA: Diagnosis present

## 2012-07-17 DIAGNOSIS — I10 Essential (primary) hypertension: Secondary | ICD-10-CM | POA: Diagnosis present

## 2012-07-17 DIAGNOSIS — I480 Paroxysmal atrial fibrillation: Secondary | ICD-10-CM

## 2012-07-17 DIAGNOSIS — Z794 Long term (current) use of insulin: Secondary | ICD-10-CM

## 2012-07-17 DIAGNOSIS — I255 Ischemic cardiomyopathy: Secondary | ICD-10-CM | POA: Diagnosis present

## 2012-07-17 DIAGNOSIS — E785 Hyperlipidemia, unspecified: Secondary | ICD-10-CM | POA: Diagnosis present

## 2012-07-17 DIAGNOSIS — J309 Allergic rhinitis, unspecified: Secondary | ICD-10-CM | POA: Diagnosis present

## 2012-07-17 DIAGNOSIS — I447 Left bundle-branch block, unspecified: Secondary | ICD-10-CM | POA: Diagnosis present

## 2012-07-17 DIAGNOSIS — Z859 Personal history of malignant neoplasm, unspecified: Secondary | ICD-10-CM

## 2012-07-17 DIAGNOSIS — R9431 Abnormal electrocardiogram [ECG] [EKG]: Secondary | ICD-10-CM | POA: Diagnosis present

## 2012-07-17 DIAGNOSIS — I2510 Atherosclerotic heart disease of native coronary artery without angina pectoris: Secondary | ICD-10-CM | POA: Diagnosis present

## 2012-07-17 DIAGNOSIS — Z7982 Long term (current) use of aspirin: Secondary | ICD-10-CM

## 2012-07-17 DIAGNOSIS — E119 Type 2 diabetes mellitus without complications: Secondary | ICD-10-CM | POA: Diagnosis present

## 2012-07-17 DIAGNOSIS — R079 Chest pain, unspecified: Secondary | ICD-10-CM | POA: Diagnosis present

## 2012-07-17 HISTORY — DX: Ischemic cardiomyopathy: I25.5

## 2012-07-17 HISTORY — DX: Paroxysmal atrial fibrillation: I48.0

## 2012-07-17 HISTORY — DX: Atherosclerotic heart disease of native coronary artery without angina pectoris: I25.10

## 2012-07-17 HISTORY — DX: Left bundle-branch block, unspecified: I44.7

## 2012-07-17 LAB — BASIC METABOLIC PANEL
BUN: 13 mg/dL (ref 6–23)
Calcium: 9.4 mg/dL (ref 8.4–10.5)
Creatinine, Ser: 0.58 mg/dL (ref 0.50–1.10)
GFR calc non Af Amer: >90 mL/min (ref >90)
Glucose, Bld: 390 mg/dL — ABNORMAL HIGH (ref 70–99)

## 2012-07-17 LAB — CBC
HCT: 40.5 % (ref 36.0–46.0)
Hemoglobin: 14.1 g/dL (ref 12.0–15.0)
MCH: 30.7 pg (ref 26.0–34.0)
MCHC: 34.8 g/dL (ref 30.0–36.0)

## 2012-07-17 LAB — POCT I-STAT TROPONIN I: Troponin i, poc: 0 ng/mL (ref 0.00–0.08)

## 2012-07-17 MED ORDER — NITROGLYCERIN 0.4 MG SL SUBL
0.4000 mg | SUBLINGUAL_TABLET | SUBLINGUAL | Status: DC | PRN
  Administered 2012-07-19: 0.4 mg via SUBLINGUAL

## 2012-07-17 MED ORDER — AMIODARONE IV BOLUS ONLY 150 MG/100ML
150.0000 mg | Freq: Once | INTRAVENOUS | Status: AC
  Administered 2012-07-17: 150 mg via INTRAVENOUS

## 2012-07-17 MED ORDER — ASPIRIN 81 MG PO CHEW
162.0000 mg | CHEWABLE_TABLET | Freq: Once | ORAL | Status: AC
  Administered 2012-07-17: 162 mg via ORAL
  Filled 2012-07-17: qty 2

## 2012-07-17 MED ORDER — SIMVASTATIN 20 MG PO TABS
20.0000 mg | ORAL_TABLET | Freq: Every day | ORAL | Status: DC
  Administered 2012-07-18 – 2012-07-20 (×3): 20 mg via ORAL
  Filled 2012-07-17 (×5): qty 1

## 2012-07-17 MED ORDER — ONDANSETRON HCL 4 MG/2ML IJ SOLN: 4.0000 mg | Freq: Four times a day (QID) | INTRAMUSCULAR | Status: DC | PRN

## 2012-07-17 MED ORDER — ATENOLOL 25 MG PO TABS
25.0000 mg | ORAL_TABLET | Freq: Every day | ORAL | Status: DC
  Administered 2012-07-18 – 2012-07-19 (×2): 25 mg via ORAL
  Filled 2012-07-17 (×3): qty 1

## 2012-07-17 MED ORDER — AMIODARONE HCL IN DEXTROSE 360-4.14 MG/200ML-% IV SOLN
INTRAVENOUS | Status: AC
  Filled 2012-07-17: qty 200

## 2012-07-17 MED ORDER — HEPARIN BOLUS VIA INFUSION
4000.0000 [IU] | Freq: Once | INTRAVENOUS | Status: AC
  Administered 2012-07-17: 4000 [IU] via INTRAVENOUS

## 2012-07-17 MED ORDER — HEPARIN (PORCINE) IN NACL 100-0.45 UNIT/ML-% IJ SOLN
1350.0000 [IU]/h | INTRAMUSCULAR | Status: DC
  Administered 2012-07-17 – 2012-07-18 (×2): 1000 [IU]/h via INTRAVENOUS
  Filled 2012-07-17 (×4): qty 250

## 2012-07-17 MED ORDER — GLIMEPIRIDE 4 MG PO TABS
4.0000 mg | ORAL_TABLET | Freq: Two times a day (BID) | ORAL | Status: DC
  Administered 2012-07-18 – 2012-07-21 (×7): 4 mg via ORAL
  Filled 2012-07-17 (×9): qty 1

## 2012-07-17 MED ORDER — ASPIRIN EC 81 MG PO TBEC
162.0000 mg | DELAYED_RELEASE_TABLET | Freq: Every day | ORAL | Status: DC
  Administered 2012-07-18 – 2012-07-19 (×2): 162 mg via ORAL
  Filled 2012-07-17 (×4): qty 2

## 2012-07-17 MED ORDER — SODIUM CHLORIDE 0.9 % IV BOLUS (SEPSIS)
1000.0000 mL | Freq: Once | INTRAVENOUS | Status: AC
  Administered 2012-07-17: 1000 mL via INTRAVENOUS

## 2012-07-17 MED ORDER — LOSARTAN POTASSIUM 50 MG PO TABS
50.0000 mg | ORAL_TABLET | Freq: Two times a day (BID) | ORAL | Status: DC
  Administered 2012-07-18 – 2012-07-21 (×7): 50 mg via ORAL
  Filled 2012-07-17 (×9): qty 1

## 2012-07-17 MED ORDER — SODIUM CHLORIDE 0.9 % IJ SOLN
3.0000 mL | Freq: Two times a day (BID) | INTRAMUSCULAR | Status: DC
  Administered 2012-07-19 – 2012-07-20 (×2): 3 mL via INTRAVENOUS

## 2012-07-17 MED ORDER — AMIODARONE HCL IN DEXTROSE 360-4.14 MG/200ML-% IV SOLN
INTRAVENOUS | Status: AC
  Administered 2012-07-18: 200 mL
  Filled 2012-07-17: qty 200

## 2012-07-17 MED ORDER — SODIUM CHLORIDE 0.9 % IV SOLN: 250.0000 mL | INTRAVENOUS | Status: DC | PRN

## 2012-07-17 MED ORDER — DEXTROSE 5 % IV SOLN
60.0000 mg/h | Freq: Once | INTRAVENOUS | Status: DC
  Filled 2012-07-17: qty 9

## 2012-07-17 MED ORDER — METFORMIN HCL 500 MG PO TABS
1000.0000 mg | ORAL_TABLET | Freq: Two times a day (BID) | ORAL | Status: DC
  Administered 2012-07-18: 1000 mg via ORAL
  Filled 2012-07-17 (×3): qty 2

## 2012-07-17 MED ORDER — ACETAMINOPHEN 325 MG PO TABS: 650.0000 mg | ORAL_TABLET | ORAL | Status: DC | PRN

## 2012-07-17 MED ORDER — ISOSORBIDE MONONITRATE ER 30 MG PO TB24
30.0000 mg | ORAL_TABLET | Freq: Every day | ORAL | Status: DC
  Administered 2012-07-18 – 2012-07-21 (×4): 30 mg via ORAL
  Filled 2012-07-17 (×4): qty 1

## 2012-07-17 MED ORDER — SODIUM CHLORIDE 0.9 % IJ SOLN: 3.0000 mL | INTRAMUSCULAR | Status: DC | PRN

## 2012-07-17 NOTE — ED Notes (Signed)
Pt st's she was just sitting at rest when she started feeling pressure in her chest, st's she thought it was the food she ate for dinner.  Pt st's pressure got worse then she felt her heart racing.  Pt denies any shortness of breath or diaphoresis.  Pt placed on defib pads for precaution.  Husband at bedside.

## 2012-07-17 NOTE — Progress Notes (Signed)
ANTICOAGULATION CONSULT NOTE - Initial Consult  Pharmacy Consult for heparin Indication: atrial fibrillation  No Known Allergies  Patient Measurements: Weight: 156 lb 8.4 oz (71 kg) Heparin Dosing Weight: 68kg  Vital Signs: BP: 102/51 mmHg (05/09 2050) Pulse Rate: 170 (05/09 2050)  Labs: No results found for this basename: HGB, HCT, PLT, APTT, LABPROT, INR, HEPARINUNFRC, CREATININE, CKTOTAL, CKMB, TROPONINI,  in the last 72 hours  The CrCl is unknown because both a height and weight (above a minimum accepted value) are required for this calculation.   Medical History: Past Medical History  Diagnosis Date  . Diabetes mellitus type II   . Hyperlipidemia   . Hypertension   . Allergic rhinitis   . Breast cancer 2002    left  . Cataract   . Myocardial infarction 2004    [place].  No PCI.  Medical management.     Medications:  See medication history  Assessment: 62 year old female presents to [facility] with chest pain found to be in afib. Orders to start IV heparin and amiodarone. Patient has no history of afib and was not taking any blood thinners except aspirin prior to admission. Baseline coags in process  Goal of Therapy:  Heparin level 0.3-0.7 units/ml Monitor platelets by anticoagulation protocol: Yes   Plan:  Give 4000 units bolus x 1 Start heparin infusion at 1000 units/hr Check anti-Xa level in 6 hours and daily while on heparin Continue to monitor H&H and platelets  Sheppard Coil PharmD., BCPS Clinical Pharmacist Pager [phone] 07/17/2012 9:32 PM

## 2012-07-17 NOTE — ED Notes (Signed)
Presents with chest pressure that began after eating chinese food radiating to both arms associated with SOB, lightheadedness. Denies nausea. HR 170s.

## 2012-07-17 NOTE — H&P (Signed)
CARDIOLOGY ADMISSION NOTE  Patient ID: [name] MRN: [id number] DOB/AGE: 1950/05/09 62 y.o.  Admit date: 07/17/2012 Primary Physician   Dr. Tawanna Cooler Primary Cardiologist   None Chief Complaint    Chest pressure  HPI: The patient presented with chest pressure.  This started at about 7 PM after she ate some [place] food. She noticed her heart racing. She noticed some heaviness in her chest. She said it felt like a an elephant. She took 2 sprays of old nitroglycerin without significant improvement. Her husband drove her to the emergency room. By the time she got here she was not having chest discomfort. However, she was noted to be in a rapid rate with wide complex tachycardia irregular. It looked like atrial fibrillation with an underlying bundle branch block versus a benign fashion. I was called urgently to the emergency room. He treated her with IV amiodarone. He subsequently converted to sinus rhythm. She was hemodynamically stable throughout. Labs are pending.  Followup EKG demonstrated sinus rhythm. She does have some diffuse ST segment depression with QT prolongation. She now has narrow complex. There is LVH by voltage criteria.  Of note she reports that prior to presentation she was doing well. She was not having any palpitations. She was able to work in the yard even today without bringing on any chest pressure, neck or arm discomfort. She hasn't been having any shortness of breath, PND or orthopnea. She's had no fevers cough or chills.   Past Medical History  Diagnosis Date  . Diabetes mellitus type II   . Hyperlipidemia   . Hypertension   . Allergic rhinitis   . Breast cancer 2002    left  . Cataract   . Myocardial infarction 2004    [place].  No PCI.  Medical management.     Past Surgical History  Procedure Laterality Date  . Mastectomy  2002    bilateral  . Tubal ligation  1987  . Bladder surgery  2000  . Right leg surgery  2001    torn ligaments and tendons x4  surgeries  . Appendectomy  1974  . Cholecystectomy  1974  . Abdominal hysterectomy  2000    No Known Allergies No current facility-administered medications on file prior to encounter.   Current Outpatient Prescriptions on File Prior to Encounter  Medication Sig Dispense Refill  . aspirin 81 MG tablet Take 81 mg by mouth daily.        Marland Kitchen atenolol (TENORMIN) 25 MG tablet TAKE ONE TABLET BY MOUTH DAILY  90 tablet  0  . Blood Glucose Monitoring Suppl (ONE TOUCH ULTRA SYSTEM KIT) W/DEVICE KIT 1 kit by Does not apply route once.        Marland Kitchen glimepiride (AMARYL) 4 MG tablet TAKE ONE TABLET BY MOUTH TWICE DAILY  60 tablet  1  . Glucose Blood (ONETOUCH ULTRA BLUE VI) by In Vitro route daily.        . isosorbide mononitrate (IMDUR) 30 MG 24 hr tablet Take 1 tablet (30 mg total) by mouth daily.  100 tablet  2  . losartan (COZAAR) 50 MG tablet TAKE ONE TABLET BY MOUTH TWICE DAILY  60 tablet  7  . metFORMIN (GLUCOPHAGE) 1000 MG tablet TAKE ONE TABLET BY MOUTH TWICE DAILY WITH MEALS - NEED OFFICE VISIT  60 tablet  3  . nitroGLYCERIN 2.5 MG CR capsule Take 1 capsule (2.5 mg total) by mouth as needed.  25 capsule  1  . simvastatin (ZOCOR) 20 MG tablet  TAKE ONE TABLET BY MOUTH AT BEDTIME  90 tablet  0   History   Social History  . Marital Status: Married    Spouse Name: N/A    Number of Children: 3  . Years of Education: N/A   Occupational History  . Not on file.   Social History Main Topics  . Smoking status: Former Smoker -- 1.00 packs/day for 10 years    Types: Cigarettes  . Smokeless tobacco: Never Used     Comment: Quit in January  . Alcohol Use: No  . Drug Use: No  . Sexually Active: Not on file   Other Topics Concern  . Not on file   Social History Narrative   Lives with husband and mother in law.      Family History  Problem Relation Age of Onset  . Kidney failure Mother   . Heart disease Mother     Died mid 65s complications of diabetes  . Arthritis Other   . Cancer Other      colon, ovarian  . Diabetes Other   . Hyperlipidemia Other   . Kidney failure Other   . Colon cancer Sister     ROS:  As stated in the HPI and negative for all other systems.  Physical Exam: Blood pressure 102/51, pulse 170, resp. rate 16, weight 150 lb (68.04 kg), SpO2 98.00%.  GENERAL:  Well appearing HEENT:  Pupils equal round and reactive, fundi not visualized, oral mucosa unremarkable NECK:  No jugular venous distention, waveform within normal limits, carotid upstroke brisk and symmetric, no bruits, no thyromegaly LYMPHATICS:  No cervical, inguinal adenopathy LUNGS:  Clear to auscultation bilaterally BACK:  No CVA tenderness CHEST:  Unremarkable, multiple surgical scars, bilateral mastectomy HEART:  PMI not displaced or sustained,S1 and S2 within normal limits, no S3,  no clicks, no rubs, no murmurs, irregular ABD:  Flat, positive bowel sounds normal in frequency in pitch, no bruits, no rebound, no guarding, no midline pulsatile mass, no hepatomegaly, no splenomegaly EXT:  2 plus pulses throughout, no edema, no cyanosis no clubbing SKIN:  No rashes no nodules NEURO:  Cranial nerves II through XII grossly intact, motor grossly intact throughout PSYCH:  Cognitively intact, oriented to person place and time  Labs:  Pending   Radiology:  Pending  EKG:  Atrial fibrillation with rapid ventricular rate, wide complex, axis WNL.  07/17/2012  ASSESSMENT AND PLAN:    ATRIAL FIBRILLATION:  I will continue IV amiodarone tonight. She'll be heparinized. I will continue low dose beta blocker.  I will check labs to include TSH.  CHEST PAIN: I will cycle cardiac enzymes. These are mildly abnormal given the absence of ongoing symptoms I would suggest noninvasive imaging. Any significant trend would require catheterization.   DIABETES:  Continue previous therapy. Checking hemoglobin A1c.  SignedRollene Rotunda 07/17/2012, 9:27 PM

## 2012-07-17 NOTE — ED Provider Notes (Addendum)
History     CSN: [id number]  Arrival date & time 07/17/12  2046   First MD Initiated Contact with Patient 07/17/12 2054      Chief Complaint  Patient presents with  . Chest Pain    (Consider location/radiation/quality/duration/timing/severity/associated sxs/prior treatment) HPI Comments: Pt with hx of DM, CAD, s/p MI (2005), HTN who comes in with cc of chest pain. Pt states that she was eating chinese food, and suddenly developed chest pressure, some dib and felt like her heart was beating fast. No n/v/f/c. No new cough, no infection, no PE, DVT hx.  Pt noted to be having a heart rate in the 180s, and irregular. No hx of afib history. No dizziness, lightheadedness.  Patient is a 62 y.o. female presenting with chest pain. The history is provided by the patient.  Chest Pain Associated symptoms: shortness of breath   Associated symptoms: no abdominal pain, no headache, no nausea and not vomiting     Past Medical History  Diagnosis Date  . Diabetes mellitus type II   . Headache   . Hyperlipidemia   . Hypertension   . Allergic rhinitis   . Breast cancer 2002    left  . Cataract   . Myocardial infarction 2004    Past Surgical History  Procedure Laterality Date  . Mastectomy  2002    bilateral  . Tubal ligation  1987  . Bladder surgery  2000  . Right leg surgery  2001    torn ligaments and tendons x4 surgeries  . Appendectomy  1974  . Cholecystectomy  1974  . Abdominal hysterectomy  2000    Family History  Problem Relation Age of Onset  . Kidney failure Mother   . Heart disease Mother   . Arthritis Other   . Cancer Other     colon, ovarian  . Diabetes Other   . Hyperlipidemia Other   . Kidney failure Other   . Colon cancer Sister     History  Substance Use Topics  . Smoking status: Current Some Day Smoker    Types: Cigarettes  . Smokeless tobacco: Never Used  . Alcohol Use: No    OB History   Grav Para Term Preterm Abortions TAB SAB Ect Mult Living                  Review of Systems  Constitutional: Negative for activity change.  HENT: Negative for facial swelling and neck pain.   Respiratory: Positive for shortness of breath. Negative for wheezing.   Cardiovascular: Positive for chest pain.  Gastrointestinal: Negative for nausea, vomiting, abdominal pain, diarrhea, constipation, blood in stool and abdominal distention.  Genitourinary: Negative for dysuria, hematuria and difficulty urinating.  Skin: Negative for color change.  Neurological: Negative for speech difficulty and headaches.  Hematological: Does not bruise/bleed easily.  Psychiatric/Behavioral: Negative for confusion.    Allergies  Review of patient's allergies indicates no known allergies.  Home Medications   Current Outpatient Rx  Name  Route  Sig  Dispense  Refill  . aspirin 81 MG tablet   Oral   Take 81 mg by mouth daily.           Marland Kitchen atenolol (TENORMIN) 25 MG tablet      TAKE ONE TABLET BY MOUTH DAILY   90 tablet   0     Office visit for more refills   . Blood Glucose Monitoring Suppl (ONE TOUCH ULTRA SYSTEM KIT) W/DEVICE KIT   Does not apply  1 kit by Does not apply route once.           Marland Kitchen glimepiride (AMARYL) 4 MG tablet      TAKE ONE TABLET BY MOUTH TWICE DAILY   60 tablet   1     CYCLE FILL MEDICATION. Authorization is required f ...   . Glucose Blood (ONETOUCH ULTRA BLUE VI)   In Vitro   by In Vitro route daily.           . insulin aspart protamine-insulin aspart (NOVOLOG 70/30) (70-30) 100 UNIT/ML injection   Subcutaneous   Inject 20 Units into the skin daily with supper. 15 units at bedtime.   10 mL   11   . isosorbide mononitrate (IMDUR) 30 MG 24 hr tablet   Oral   Take 1 tablet (30 mg total) by mouth daily.   100 tablet   2   . losartan (COZAAR) 50 MG tablet      TAKE ONE TABLET BY MOUTH TWICE DAILY   60 tablet   7     CYCLE FILL MEDICATION. Authorization is required f ...   . metFORMIN (GLUCOPHAGE) 1000 MG  tablet      TAKE ONE TABLET BY MOUTH TWICE DAILY WITH MEALS - NEED OFFICE VISIT   60 tablet   3     PT HAS APPOINTMENT IN MARCH, PLEASE SEND [place] REFILL .Marland Kitchen.   . nitroGLYCERIN 2.5 MG CR capsule   Oral   Take 1 capsule (2.5 mg total) by mouth as needed.   25 capsule   1   . simvastatin (ZOCOR) 20 MG tablet      TAKE ONE TABLET BY MOUTH AT BEDTIME   90 tablet   0     Office visit for more refills     BP 102/51  Pulse 170  Resp 16  SpO2 98%  Physical Exam  Nursing note and vitals reviewed. Constitutional: She is oriented to person, place, and time. She appears well-developed and well-nourished.  HENT:  Head: Normocephalic and atraumatic.  Eyes: EOM are normal. Pupils are equal, round, and reactive to light.  Neck: Neck supple. No JVD present.  Cardiovascular: Normal heart sounds.   No murmur heard. Irregularly irregular  Pulmonary/Chest: Effort normal. No respiratory distress.  Abdominal: Soft. She exhibits no distension. There is no tenderness. There is no rebound and no guarding.  Neurological: She is alert and oriented to person, place, and time.  Skin: Skin is warm and dry.    ED Course  Procedures (including critical care time)  Labs Reviewed  CBC  BASIC METABOLIC PANEL  URINALYSIS, ROUTINE W REFLEX MICROSCOPIC  MAGNESIUM   No results found.   No diagnosis found.    MDM   Date: 07/17/2012  Rate: 178  Rhythm: atrial fibrillation  QRS Axis: normal  Intervals: normal  ST/T Wave abnormalities: nonspecific ST/T changes  Conduction Disutrbances:none  Narrative Interpretation:   Old EKG Reviewed: none available  Pt comes in with cc of chest pressure. Hx of CAD, DM, HTN. Noted to be in afib with RVR.  Pt has a wide complex rhythm. There are some ST changes as well, elevation in the anterior leads with depression in the inferior leads. Pt is hemodynamically stable otherwise and there is no AMS, diaphoresis.  Because of the CAD hx, the chest pressure  and suttle EKG changes - we called Cardiology service immediately.  Amiodarone was started  - and patient went into sinus rhythm. ABCD2 score is >  2 - so heparin started as well.  Cardiology to admit the patient.  CRITICAL CARE Performed by: Derwood Kaplan   Total critical care time: 30 minutes  Critical care time was exclusive of separately billable procedures and treating other patients.  Critical care was necessary to treat or prevent imminent or life-threatening deterioration.  Critical care was time spent personally by me on the following activities: development of treatment plan with patient and/or surrogate as well as nursing, discussions with consultants, evaluation of patient's response to treatment, examination of patient, obtaining history from patient or surrogate, ordering and performing treatments and interventions, ordering and review of laboratory studies, ordering and review of radiographic studies, pulse oximetry and re-evaluation of patient's condition.     Derwood Kaplan, MD 07/17/12 2136  11:15 PM Pt has converted to sinus rhythm. Amiodarone gtt initiated per Cards.  Derwood Kaplan, MD 07/17/12 2316

## 2012-07-18 ENCOUNTER — Other Ambulatory Visit: Payer: Self-pay

## 2012-07-18 LAB — CBC WITH DIFFERENTIAL/PLATELET
Basophils Absolute: 0 10*3/uL (ref 0.0–0.1)
Basophils Relative: 0 % (ref 0–1)
Eosinophils Absolute: 0.1 10*3/uL (ref 0.0–0.7)
Eosinophils Relative: 1 % (ref 0–5)
Lymphocytes Relative: 43 % (ref 12–46)
MCH: 31.3 pg (ref 26.0–34.0)
MCHC: 35.7 g/dL (ref 30.0–36.0)
MCV: 87.7 fL (ref 78.0–100.0)
Platelets: 192 10*3/uL (ref 150–400)
RDW: 12.9 % (ref 11.5–15.5)
WBC: 7.8 10*3/uL (ref 4.0–10.5)

## 2012-07-18 LAB — TROPONIN I
Troponin I: <0.3 ng/mL (ref <0.30)
Troponin I: <0.3 ng/mL (ref <0.30)

## 2012-07-18 LAB — COMPREHENSIVE METABOLIC PANEL
Alkaline Phosphatase: 51 U/L (ref 39–117)
BUN: 13 mg/dL (ref 6–23)
GFR calc Af Amer: >90 mL/min (ref >90)
GFR calc non Af Amer: >90 mL/min (ref >90)
Glucose, Bld: 328 mg/dL — ABNORMAL HIGH (ref 70–99)
Potassium: 3.2 mEq/L — ABNORMAL LOW (ref 3.5–5.1)
Total Protein: 6.1 g/dL (ref 6.0–8.3)

## 2012-07-18 LAB — BASIC METABOLIC PANEL
BUN: 14 mg/dL (ref 6–23)
CO2: 22 mEq/L (ref 19–32)
Chloride: 101 mEq/L (ref 96–112)
Glucose, Bld: 258 mg/dL — ABNORMAL HIGH (ref 70–99)
Potassium: 3.3 mEq/L — ABNORMAL LOW (ref 3.5–5.1)
Sodium: 136 mEq/L (ref 135–145)

## 2012-07-18 LAB — GLUCOSE, CAPILLARY
Glucose-Capillary: 226 mg/dL — ABNORMAL HIGH (ref 70–99)
Glucose-Capillary: 204 mg/dL — ABNORMAL HIGH (ref 70–99)
Glucose-Capillary: 221 mg/dL — ABNORMAL HIGH (ref 70–99)

## 2012-07-18 LAB — CBC
HCT: 34.5 % — ABNORMAL LOW (ref 36.0–46.0)
Hemoglobin: 11.7 g/dL — ABNORMAL LOW (ref 12.0–15.0)
MCH: 30.2 pg (ref 26.0–34.0)
MCHC: 33.9 g/dL (ref 30.0–36.0)
RBC: 3.87 MIL/uL (ref 3.87–5.11)

## 2012-07-18 LAB — MAGNESIUM: Magnesium: 1.3 mg/dL — ABNORMAL LOW (ref 1.5–2.5)

## 2012-07-18 LAB — LIPID PANEL: Cholesterol: 144 mg/dL (ref 0–200)

## 2012-07-18 MED ORDER — ASPIRIN 81 MG PO CHEW
324.0000 mg | CHEWABLE_TABLET | ORAL | Status: DC
  Filled 2012-07-18: qty 4

## 2012-07-18 MED ORDER — AMIODARONE HCL 200 MG PO TABS
400.0000 mg | ORAL_TABLET | Freq: Two times a day (BID) | ORAL | Status: DC
  Administered 2012-07-18 – 2012-07-20 (×6): 400 mg via ORAL
  Filled 2012-07-18 (×8): qty 2

## 2012-07-18 MED ORDER — SODIUM CHLORIDE 0.9 % IV SOLN: 250.0000 mL | INTRAVENOUS | Status: DC | PRN

## 2012-07-18 MED ORDER — POTASSIUM CHLORIDE CRYS ER 20 MEQ PO TBCR
20.0000 meq | EXTENDED_RELEASE_TABLET | Freq: Two times a day (BID) | ORAL | Status: DC
  Administered 2012-07-18 – 2012-07-21 (×7): 20 meq via ORAL
  Filled 2012-07-18 (×8): qty 1

## 2012-07-18 MED ORDER — DIAZEPAM 5 MG PO TABS
5.0000 mg | ORAL_TABLET | ORAL | Status: AC
  Administered 2012-07-20: 5 mg via ORAL
  Filled 2012-07-18: qty 1

## 2012-07-18 MED ORDER — SODIUM CHLORIDE 0.9 % IJ SOLN
3.0000 mL | Freq: Two times a day (BID) | INTRAMUSCULAR | Status: DC
  Administered 2012-07-19: 3 mL via INTRAVENOUS

## 2012-07-18 MED ORDER — SODIUM CHLORIDE 0.9 % IJ SOLN: 3.0000 mL | INTRAMUSCULAR | Status: DC | PRN

## 2012-07-18 MED ORDER — AMIODARONE HCL IN DEXTROSE 360-4.14 MG/200ML-% IV SOLN
60.0000 mg/h | INTRAVENOUS | Status: AC
  Administered 2012-07-18: 60 mg/h via INTRAVENOUS
  Filled 2012-07-18 (×2): qty 200

## 2012-07-18 NOTE — Progress Notes (Signed)
Subjective:  No chest pain but feels weak. EKG shows anterior T wave inversions after she broke from her atrial fib with LBBB pattern. Cardiac enzymes are negative so far. States that when she had her previous MI in [place] she did not have any chest pain.  Objective:  Vital Signs in the last 24 hours: Temp:  [97.4 F (36.3 C)-98.8 F (37.1 C)] 98.8 F (37.1 C) (05/10 0608) Pulse Rate:  [79-171] 79 (05/10 0608) Resp:  [15-23] 20 (05/10 0608) BP: (102-136)/(51-79) 125/70 mmHg (05/10 0608) SpO2:  [98 %-100 %] 99 % (05/10 0608) Weight:  [150 lb (68.04 kg)-155 lb (70.308 kg)] 155 lb (70.308 kg) (05/09 2318)  Intake/Output from previous day:   Intake/Output from this shift: Total I/O In: 240 [P.O.:240] Out: -   . aspirin EC  162 mg Oral Daily  . atenolol  25 mg Oral Daily  . glimepiride  4 mg Oral BID WC  . isosorbide mononitrate  30 mg Oral Daily  . losartan  50 mg Oral BID  . metFORMIN  1,000 mg Oral BID WC  . potassium chloride  20 mEq Oral BID  . simvastatin  20 mg Oral QHS  . sodium chloride  3 mL Intravenous Q12H   . heparin 1,000 Units/hr (07/17/12 2130)    Physical Exam: The patient appears to be in no distress.  Head and neck exam reveals that the pupils are equal and reactive.  The extraocular movements are full.  There is no scleral icterus.  Mouth and pharynx are benign.  No lymphadenopathy.  No carotid bruits.  The jugular venous pressure is normal.  Thyroid is not enlarged or tender.  Chest is clear to percussion and auscultation.  No rales or rhonchi.  Expansion of the chest is symmetrical.  Heart reveals no abnormal lift or heave.  First and second heart sounds are normal.  There is no murmur gallop rub or click.  The abdomen is soft and nontender.  Bowel sounds are normoactive.  There is no hepatosplenomegaly or mass.  There are no abdominal bruits.  Extremities reveal no phlebitis or edema.  Pedal pulses are good.  There is no cyanosis or  clubbing.  Neurologic exam is normal strength and no lateralizing weakness.  No sensory deficits.  Integument reveals no rash  Lab Results:  Recent Labs  07/18/12 0114 07/18/12 0500  WBC 7.8 7.9  HGB 12.2 11.7*  PLT 192 198    Recent Labs  07/18/12 0114 07/18/12 0500  NA 137 136  K 3.2* 3.3*  CL 102 101  CO2 24 22  GLUCOSE 328* 258*  BUN 13 14  CREATININE 0.53 0.46*    Recent Labs  07/18/12 0107 07/18/12 0414  TROPONINI <0.30 <0.30   Hepatic Function Panel  Recent Labs  07/18/12 0114  PROT 6.1  ALBUMIN 3.4*  AST 13  ALT 19  ALKPHOS 51  BILITOT 0.3    Recent Labs  07/18/12 0500  CHOL 144   No results found for this basename: PROTIME,  in the last 72 hours  Imaging: Dg Chest Port 1 View  07/17/2012  *RADIOLOGY REPORT*  Clinical Data: Chest pain, atrial fibrillation, history hypertension, diabetes, breast cancer  PORTABLE CHEST - 1 VIEW  Comparison: Portable exam 2125 hours without priors for comparison.  Findings: Normal heart size and pulmonary vascularity. Tortuous aorta. External pacing lead and EKG leads noted. Lungs grossly clear. No pleural effusion or pneumothorax. Surgical clips in the axillae bilaterally. Bones demineralized.  IMPRESSION:  No acute abnormalities.   Original Report Authenticated By: Ulyses Southward, M.D.     Cardiac Studies: Telemetry shows NSR Assessment/Plan:  1.  Paroxysmal atrial fibrillation.      Plan: continue IV amiodarone loading.       2D echo. 2.  Ischemic heart disease.  Remote MI cathed but did not require intervention in 2004.  3.  Diabetes.       Hold metformin for cath Plan: I think she should have cardiac cath to evaluate further. Will add to board for Monday.  LOS: 1 day    Cassell Clement 07/18/2012, 10:27 AM

## 2012-07-18 NOTE — Progress Notes (Signed)
ANTICOAGULATION CONSULT NOTE - Follow Up Consult  Pharmacy Consult for heparin Indication: atrial fibrillation  No Known Allergies  Patient Measurements: Height: 5' 4.5" (163.8 cm) Weight: 155 lb (70.308 kg) IBW/kg (Calculated) : 55.85 Heparin Dosing Weight: 68 kg  Vital Signs: Temp: 98.8 F (37.1 C) (05/10 0608) Temp src: Oral (05/10 0608) BP: 125/70 mmHg (05/10 0608) Pulse Rate: 79 (05/10 0608)  Labs:  Recent Labs  07/17/12 2052 07/18/12 0107 07/18/12 0114 07/18/12 0500 07/18/12 0510  HGB 14.1  --  12.2 11.7*  --   HCT 40.5  --  34.2* 34.5*  --   PLT 289  --  192 198  --   HEPARINUNFRC  --   --   --   --  0.53  CREATININE 0.58  --  0.53 0.46*  --   TROPONINI  --  <0.30  --   --   --     Estimated Creatinine Clearance: 72.8 ml/min (by C-G formula based on Cr of 0.46).  Assessment: Patient is a 62 y.o F on heparin for afib.  CE enzymes negative.  CBC stable.  Heparin level is therapeutic at 0.53.  Goal of Therapy:  Heparin level 0.3-0.7 units/ml Monitor platelets by anticoagulation protocol: Yes   Plan:  1) continue current heparin rate  Dalary Hollar P 07/18/2012,7:17 AM

## 2012-07-18 NOTE — Progress Notes (Signed)
Utilization Review Completed.   Willena Jeancharles, RN, BSN Nurse Case Manager  [phone]  

## 2012-07-19 ENCOUNTER — Encounter (HOSPITAL_COMMUNITY): Payer: Self-pay | Admitting: *Deleted

## 2012-07-19 DIAGNOSIS — I517 Cardiomegaly: Secondary | ICD-10-CM

## 2012-07-19 LAB — BASIC METABOLIC PANEL
CO2: 21 mEq/L (ref 19–32)
Calcium: 9.3 mg/dL (ref 8.4–10.5)
Chloride: 101 mEq/L (ref 96–112)
Sodium: 136 mEq/L (ref 135–145)

## 2012-07-19 LAB — HEPARIN LEVEL (UNFRACTIONATED): Heparin Unfractionated: 0.17 IU/mL — ABNORMAL LOW (ref 0.30–0.70)

## 2012-07-19 LAB — URINALYSIS, ROUTINE W REFLEX MICROSCOPIC
Bilirubin Urine: NEGATIVE
Hgb urine dipstick: NEGATIVE
Protein, ur: NEGATIVE mg/dL
Urobilinogen, UA: 0.2 mg/dL (ref 0.0–1.0)

## 2012-07-19 LAB — URINE MICROSCOPIC-ADD ON

## 2012-07-19 LAB — CBC
Platelets: 210 10*3/uL (ref 150–400)
RBC: 4.05 MIL/uL (ref 3.87–5.11)
WBC: 6.9 10*3/uL (ref 4.0–10.5)

## 2012-07-19 LAB — PROTIME-INR: Prothrombin Time: 14.2 seconds (ref 11.6–15.2)

## 2012-07-19 MED ORDER — SODIUM CHLORIDE 0.9 % IV SOLN
INTRAVENOUS | Status: DC
  Administered 2012-07-20: 04:00:00 via INTRAVENOUS

## 2012-07-19 MED ORDER — SODIUM CHLORIDE 0.9 % IJ SOLN
3.0000 mL | Freq: Two times a day (BID) | INTRAMUSCULAR | Status: DC
  Administered 2012-07-19 (×2): 3 mL via INTRAVENOUS

## 2012-07-19 MED ORDER — NITROGLYCERIN 0.4 MG SL SUBL
SUBLINGUAL_TABLET | SUBLINGUAL | Status: AC
  Administered 2012-07-19: 15:00:00
  Filled 2012-07-19: qty 25

## 2012-07-19 MED ORDER — SODIUM CHLORIDE 0.9 % IJ SOLN: 3.0000 mL | INTRAMUSCULAR | Status: DC | PRN

## 2012-07-19 MED ORDER — SODIUM CHLORIDE 0.9 % IV SOLN: 250.0000 mL | INTRAVENOUS | Status: DC | PRN

## 2012-07-19 MED ORDER — ASPIRIN 81 MG PO CHEW
324.0000 mg | CHEWABLE_TABLET | ORAL | Status: AC
Start: 2012-07-20 — End: 2012-07-20
  Administered 2012-07-20: 324 mg via ORAL

## 2012-07-19 NOTE — Progress Notes (Signed)
ANTICOAGULATION CONSULT NOTE - Follow Up Consult  Pharmacy Consult for heparin Indication: atrial fibrillation  No Known Allergies  Patient Measurements: Height: 5' 4.5" (163.8 cm) Weight: 155 lb (70.308 kg) IBW/kg (Calculated) : 55.85 Heparin Dosing Weight: 68 kg  Vital Signs: Temp: 98.6 F (37 C) (05/11 0556) Temp src: Oral (05/11 0556) BP: 146/85 mmHg (05/11 0556) Pulse Rate: 72 (05/11 0556)  Labs:  Recent Labs  07/17/12 2052 07/18/12 0107 07/18/12 0114 07/18/12 0414 07/18/12 0500 07/18/12 0510 07/18/12 1137 07/19/12 0520  HGB 14.1  --  12.2  --  11.7*  --   --  12.0  HCT 40.5  --  34.2*  --  34.5*  --   --  35.4*  PLT 289  --  192  --  198  --   --  210  LABPROT  --   --   --   --   --   --   --  14.2  INR  --   --   --   --   --   --   --  1.11  HEPARINUNFRC  --   --   --   --   --  0.53  --  0.22*  CREATININE 0.58  --  0.53  --  0.46*  --   --   --   TROPONINI  --  <0.30  --  <0.30  --   --  <0.30  --     Estimated Creatinine Clearance: 72.8 ml/min (by C-G formula based on Cr of 0.46).  Assessment: Patient is a 62 y.o F on heparin for afib/CP with plan for cardiac cath procedure on 5/12.  Heparin level is subtherapeutic at 0.22.  No issues with IV line and no bleeding noted per RN.  Goal of Therapy:  Heparin level 0.3-0.7 units/ml Monitor platelets by anticoagulation protocol: Yes   Plan:  1) increase heparin drip to 1150 units/hr 2) check 6 hour heparin level  Rogene Meth P 07/19/2012,7:27 AM

## 2012-07-19 NOTE — Progress Notes (Signed)
Pt had called and c/o of chest pressure at 8/10. EKG done, Ntg SL given , BP 151/82, HR 93 nd O2 saturation at 93 %. Aditional Ntg SL given  On score of 5/10 chest pressure. Third Ntg SL given with Chest pressure at 3/10/ BP at 1221 72, HR = 71. Resulting pressure scale at 0/10. Will update [place] on episode. Thanks  Ancil Linsey RN

## 2012-07-19 NOTE — Progress Notes (Addendum)
 Patient: [name] Date of Encounter: 07/19/2012, 9:43 AM Admit date: 07/17/2012     Subjective  No further CP, since admission   Objective   Telemetry: NSR Physical Exam: Filed Vitals:   07/19/12 0928  BP: 139/70  Pulse: 94  Temp:   Resp:    General: Well developed, well nourished, in no acute distress. Head: Normocephalic, atraumatic, sclera non-icteric, no xanthomas, nares are without discharge.  Neck: Negative for carotid bruits. JVD not elevated. Lungs: Clear bilaterally to auscultation without wheezes, rales, or rhonchi. Breathing is unlabored. Heart: RRR S1 S2 without murmurs, rubs, or gallops.  Abdomen: Soft, non-tender, non-distended with normoactive bowel sounds. No hepatomegaly. No rebound/guarding. No obvious abdominal masses. Msk:  Strength and tone appear normal for age. Extremities: No peripheral edema.   Neuro: Alert and oriented X 3. Moves all extremities spontaneously. Psych:  Responds to questions appropriately with a normal affect.    Intake/Output Summary (Last 24 hours) at 07/19/12 0943 Last data filed at 07/18/12 1500  Gross per 24 hour  Intake    240 ml  Output      0 ml  Net    240 ml    Inpatient Medications:  . amiodarone  400 mg Oral BID  . [START ON 07/20/2012] aspirin  324 mg Oral Pre-Cath  . aspirin EC  162 mg Oral Daily  . atenolol  25 mg Oral Daily  . [START ON 07/20/2012] diazepam  5 mg Oral On Call  . glimepiride  4 mg Oral BID WC  . isosorbide mononitrate  30 mg Oral Daily  . losartan  50 mg Oral BID  . potassium chloride  20 mEq Oral BID  . simvastatin  20 mg Oral QHS  . sodium chloride  3 mL Intravenous Q12H  . sodium chloride  3 mL Intravenous Q12H    Labs:  Recent Labs  07/17/12 2105 07/18/12 0114 07/18/12 0500 07/19/12 0520  NA  --  137 136 136  K  --  3.2* 3.3* 3.9  CL  --  102 101 101  CO2  --  24 22 21  GLUCOSE  --  328* 258* 242*  BUN  --  13 14 8  CREATININE  --  0.53 0.46* 0.42*  CALCIUM  --  8.5 8.2*  9.3  MG 1.5 1.3*  --   --     Recent Labs  07/18/12 0114  AST 13  ALT 19  ALKPHOS 51  BILITOT 0.3  PROT 6.1  ALBUMIN 3.4*   No results found for this basename: LIPASE, AMYLASE,  in the last 72 hours  Recent Labs  07/18/12 0114 07/18/12 0500 07/19/12 0520  WBC 7.8 7.9 6.9  NEUTROABS 4.0  --   --   HGB 12.2 11.7* 12.0  HCT 34.2* 34.5* 35.4*  MCV 87.7 89.1 87.4  PLT 192 198 210    Recent Labs  07/18/12 0107 07/18/12 0414 07/18/12 1137  TROPONINI <0.30 <0.30 <0.30   No components found with this basename: POCBNP,  No results found for this basename: DDIMER,  in the last 72 hours  Recent Labs  07/18/12 0114  HGBA1C 9.2*    Recent Labs  07/18/12 0500  CHOL 144  HDL 39*  LDLCALC UNABLE TO CALCULATE IF TRIGLYCERIDE OVER 400 mg/dL  TRIG 530*  CHOLHDL 3.7    Recent Labs  07/18/12 0114  TSH 2.447   No results found for this basename: VITAMINB12, FOLATE, FERRITIN, TIBC, IRON, RETICCTPCT,  in the last   72 hours  Radiology/Studies:  Dg Chest Port 1 View  07/17/2012  *RADIOLOGY REPORT*  Clinical Data: Chest pain, atrial fibrillation, history hypertension, diabetes, breast cancer  PORTABLE CHEST - 1 VIEW  Comparison: Portable exam 2125 hours without priors for comparison.  Findings: Normal heart size and pulmonary vascularity. Tortuous aorta. External pacing lead and EKG leads noted. Lungs grossly clear. No pleural effusion or pneumothorax. Surgical clips in the axillae bilaterally. Bones demineralized.  IMPRESSION: No acute abnormalities.   Original Report Authenticated By: Mark Boles, M.D.      Assessment and Plan  1 UAP  - NL Tns 2 CAD  - h/o remote MI/cath in 2004, but no PCI 3 PAF  - maintaining NSR on Amio 3 DM 4 Tobacco  - DC'd 03/2012  PLAN: Proceed with cardiac cath in AM. F/u echo from this AM.   Signed, SERPE, EUGENE PA-C  Agree with above. Exam reveals very soft systolic murmur at aortic area. 2 D echo just completed, results pending. No  further chest discomfort. Get followup EKG today to follow abnormal anterior T wave changes. Plan cath in am.  

## 2012-07-19 NOTE — Progress Notes (Signed)
  Echocardiogram 2D Echocardiogram has been performed.  Tayte Mcwherter 07/19/2012, 10:10 AM

## 2012-07-19 NOTE — Progress Notes (Signed)
ANTICOAGULATION CONSULT NOTE - Follow Up Consult  Pharmacy Consult for heparin Indication: atrial fibrillation  No Known Allergies  Patient Measurements: Height: 5' 4.5" (163.8 cm) Weight: 155 lb (70.308 kg) IBW/kg (Calculated) : 55.85 Heparin Dosing Weight: 68 kg  Vital Signs: Temp: 98.2 F (36.8 C) (05/11 1316) Temp src: Oral (05/11 1316) BP: 137/81 mmHg (05/11 1316) Pulse Rate: 76 (05/11 1316)  Labs:  Recent Labs  07/18/12 0107 07/18/12 0114 07/18/12 0414 07/18/12 0500 07/18/12 0510 07/18/12 1137 07/19/12 0520 07/19/12 1500  HGB  --  12.2  --  11.7*  --   --  12.0  --   HCT  --  34.2*  --  34.5*  --   --  35.4*  --   PLT  --  192  --  198  --   --  210  --   LABPROT  --   --   --   --   --   --  14.2  --   INR  --   --   --   --   --   --  1.11  --   HEPARINUNFRC  --   --   --   --  0.53  --  0.22* 0.17*  CREATININE  --  0.53  --  0.46*  --   --  0.42*  --   TROPONINI <0.30  --  <0.30  --   --  <0.30  --   --     Estimated Creatinine Clearance: 72.8 ml/min (by C-G formula based on Cr of 0.42).  Assessment: Patient is a 62 y/o female patient on heparin for afib/CP with plan for cardiac cath procedure on 5/12.  Heparin level remains subtherapeutic, will increase rate further.  Goal of Therapy:  Heparin level 0.3-0.7 units/ml Monitor platelets by anticoagulation protocol: Yes   Plan:  Increase gtt to 1350 units/hr and check 6 hour heparin level.  Verlene Mayer, PharmD, BCPS Pager [phone] 07/19/2012,4:26 PM

## 2012-07-20 ENCOUNTER — Encounter (HOSPITAL_COMMUNITY)
Admission: EM | Disposition: A | Discharge status: Home / Self Care | Payer: Self-pay | Source: Home / Self Care | Attending: Cardiology

## 2012-07-20 DIAGNOSIS — I2510 Atherosclerotic heart disease of native coronary artery without angina pectoris: Secondary | ICD-10-CM

## 2012-07-20 HISTORY — PX: LEFT HEART CATHETERIZATION WITH CORONARY ANGIOGRAM: SHX5451

## 2012-07-20 LAB — HEPARIN LEVEL (UNFRACTIONATED): Heparin Unfractionated: 0.3 IU/mL (ref 0.30–0.70)

## 2012-07-20 LAB — CBC
HCT: 37.4 % (ref 36.0–46.0)
Hemoglobin: 12.9 g/dL (ref 12.0–15.0)
RBC: 4.27 MIL/uL (ref 3.87–5.11)
WBC: 9.8 10*3/uL (ref 4.0–10.5)

## 2012-07-20 LAB — BASIC METABOLIC PANEL
BUN: 10 mg/dL (ref 6–23)
CO2: 24 mEq/L (ref 19–32)
Chloride: 101 mEq/L (ref 96–112)
Glucose, Bld: 252 mg/dL — ABNORMAL HIGH (ref 70–99)
Potassium: 4.1 mEq/L (ref 3.5–5.1)

## 2012-07-20 LAB — GLUCOSE, CAPILLARY
Glucose-Capillary: 219 mg/dL — ABNORMAL HIGH (ref 70–99)
Glucose-Capillary: 253 mg/dL — ABNORMAL HIGH (ref 70–99)

## 2012-07-20 SURGERY — LEFT HEART CATHETERIZATION WITH CORONARY ANGIOGRAM
Anesthesia: LOCAL

## 2012-07-20 MED ORDER — CARVEDILOL 25 MG PO TABS
25.0000 mg | ORAL_TABLET | Freq: Two times a day (BID) | ORAL | Status: DC
  Administered 2012-07-20 – 2012-07-21 (×2): 25 mg via ORAL
  Filled 2012-07-20 (×4): qty 1

## 2012-07-20 MED ORDER — HEPARIN SODIUM (PORCINE) 1000 UNIT/ML IJ SOLN
INTRAMUSCULAR | Status: AC
  Filled 2012-07-20: qty 1

## 2012-07-20 MED ORDER — VERAPAMIL HCL 2.5 MG/ML IV SOLN
INTRAVENOUS | Status: AC
  Filled 2012-07-20: qty 2

## 2012-07-20 MED ORDER — MIDAZOLAM HCL 2 MG/2ML IJ SOLN
INTRAMUSCULAR | Status: AC
  Filled 2012-07-20: qty 2

## 2012-07-20 MED ORDER — FENTANYL CITRATE 0.05 MG/ML IJ SOLN
INTRAMUSCULAR | Status: AC
  Filled 2012-07-20: qty 2

## 2012-07-20 MED ORDER — LIDOCAINE HCL (PF) 1 % IJ SOLN
INTRAMUSCULAR | Status: AC
  Filled 2012-07-20: qty 30

## 2012-07-20 MED ORDER — HEPARIN (PORCINE) IN NACL 2-0.9 UNIT/ML-% IJ SOLN
INTRAMUSCULAR | Status: AC
  Filled 2012-07-20: qty 1000

## 2012-07-20 MED ORDER — SODIUM CHLORIDE 0.9 % IV SOLN: 1.0000 mL/kg/h | INTRAVENOUS | Status: AC

## 2012-07-20 NOTE — Interval H&P Note (Signed)
History and Physical Interval Note:  07/20/2012 7:31 AM  [name]  has presented today for surgery, with the diagnosis of cp  The various methods of treatment have been discussed with the patient and family. After consideration of risks, benefits and other options for treatment, the patient has consented to  Procedure(s): LEFT HEART CATHETERIZATION WITH CORONARY ANGIOGRAM (N/A) as a surgical intervention .  The patient's history has been reviewed, patient examined, no change in status, stable for surgery.  I have reviewed the patient's chart and labs.  Questions were answered to the patient's satisfaction.     Theron Arista [facility] 07/20/2012 7:32 AM

## 2012-07-20 NOTE — Progress Notes (Signed)
ANTICOAGULATION CONSULT NOTE - Follow Up Consult  Pharmacy Consult for heparin Indication: atrial fibrillation  No Known Allergies  Patient Measurements: Height: 5' 4.5" (163.8 cm) Weight: 155 lb (70.308 kg) IBW/kg (Calculated) : 55.85 Heparin Dosing Weight: 68 kg  Vital Signs: Temp: 99.4 F (37.4 C) (05/11 2115) Temp src: Oral (05/11 2115) BP: 144/82 mmHg (05/11 2115) Pulse Rate: 70 (05/11 2115)  Labs:  Recent Labs  07/18/12 0107 07/18/12 0114 07/18/12 0414 07/18/12 0500  07/18/12 1137 07/19/12 0520 07/19/12 1500 07/20/12 0045  HGB  --  12.2  --  11.7*  --   --  12.0  --   --   HCT  --  34.2*  --  34.5*  --   --  35.4*  --   --   PLT  --  192  --  198  --   --  210  --   --   LABPROT  --   --   --   --   --   --  14.2  --   --   INR  --   --   --   --   --   --  1.11  --   --   HEPARINUNFRC  --   --   --   --   < >  --  0.22* 0.17* 0.33  CREATININE  --  0.53  --  0.46*  --   --  0.42*  --   --   TROPONINI <0.30  --  <0.30  --   --  <0.30  --   --   --   < > = values in this interval not displayed.  Estimated Creatinine Clearance: 72.8 ml/min (by C-G formula based on Cr of 0.42).  Assessment: Patient is a 62 y/o female patient on heparin for afib/CP with plan for cardiac cath procedure on 5/12 AM.  Heparin level (0.33) therapeutic.  Goal of Therapy:  Heparin level 0.3-0.7 units/ml Monitor platelets by anticoagulation protocol: Yes   Plan:  Continue heparin at 1350 units/hr F/u plans after cath  Bayard Hugger, PharmD, BCPS  Clinical Pharmacist  Pager: [phone]   07/20/2012,2:20 AM

## 2012-07-20 NOTE — CV Procedure (Signed)
   Cardiac Catheterization Procedure Note  Name: [name] MRN: [id number] DOB: 07/03/1950  Procedure: Left Heart Cath, Selective Coronary Angiography, LV angiography  Indication: 62 year old and an and and and a female with history of coronary disease, diabetes, hyperlipidemia, and tobacco abuse presents with symptoms of acute chest pressure associated with atrial fibrillation with a rapid ventricular response. She had a new left bundle branch block.   Procedural Details: The right wrist was prepped, draped, and anesthetized with 1% lidocaine. Using the modified Seldinger technique, a 5 French sheath was introduced into the right radial artery. 3 mg of verapamil was administered through the sheath, weight-based unfractionated heparin was administered intravenously. Standard Judkins catheters were used for selective coronary angiography and left ventriculography. Catheter exchanges were performed over an exchange length guidewire. There were no immediate procedural complications. A TR band was used for radial hemostasis at the completion of the procedure.  The patient was transferred to the post catheterization recovery area for further monitoring.  Procedural Findings: Hemodynamics: AO 115/58 with a mean of 81 mmHg LV 117/18 mmHg  Coronary angiography: Coronary dominance: right  Left mainstem: There is diffuse narrowing of the left main to 30-40%.  Left anterior descending (LAD): The left anterior descending artery is moderately calcified throughout the proximal to mid vessel. There is diffuse disease throughout the proximal to mid segment. The proximal vessel there is a to 70% stenosis. In the mid vessel there is diffuse 70% disease with one focal area of 80%. The first diagonal is a moderate-sized vessel with an 80-90% stenosis proximally.  Left circumflex (LCx): The left circumflex gives rise to 2 marginal vessels before terminating in a third marginal branch. The first marginal  vessel is very small in caliber with diffuse disease up to 90%. The second marginal branch is a large branch which has an 80-90% stenosis proximally. This is followed by diffuse 50% disease in the mid vessel and a 70-80% stenosis distally after the bifurcation.  Right coronary artery (RCA): The right coronary is a dominant vessel. It is mildly calcified proximally. There are diffuse irregularities in the vessel up to 20-30%.  Left ventriculography: Left ventricular systolic function is abnormal, LVEF is estimated at 40%, there is global hypokinesis, there is no significant mitral regurgitation   Final Conclusions:   1. Diffuse 2 vessel obstructive coronary artery disease 2. Moderate left ventricular dysfunction.  Recommendations: We will optimize her medical therapy. Continue amiodarone for treatment of her atrial fibrillation. Given her high [place] score would recommend anticoagulation. Recommend ischemia/viability assessment as an outpatient. If this demonstrates significant ischemia/viability would recommend coronary bypass surgery.  Theron Arista [facility] 07/20/2012, 8:07 AM

## 2012-07-20 NOTE — H&P (View-Only) (Signed)
Patient: [name] Date of Encounter: 07/19/2012, 9:43 AM Admit date: 07/17/2012     Subjective  No further CP, since admission   Objective   Telemetry: NSR Physical Exam: Filed Vitals:   07/19/12 0928  BP: 139/70  Pulse: 94  Temp:   Resp:    General: Well developed, well nourished, in no acute distress. Head: Normocephalic, atraumatic, sclera non-icteric, no xanthomas, nares are without discharge.  Neck: Negative for carotid bruits. JVD not elevated. Lungs: Clear bilaterally to auscultation without wheezes, rales, or rhonchi. Breathing is unlabored. Heart: RRR S1 S2 without murmurs, rubs, or gallops.  Abdomen: Soft, non-tender, non-distended with normoactive bowel sounds. No hepatomegaly. No rebound/guarding. No obvious abdominal masses. Msk:  Strength and tone appear normal for age. Extremities: No peripheral edema.   Neuro: Alert and oriented X 3. Moves all extremities spontaneously. Psych:  Responds to questions appropriately with a normal affect.    Intake/Output Summary (Last 24 hours) at 07/19/12 0943 Last data filed at 07/18/12 1500  Gross per 24 hour  Intake    240 ml  Output      0 ml  Net    240 ml    Inpatient Medications:  . amiodarone  400 mg Oral BID  . [START ON 07/20/2012] aspirin  324 mg Oral Pre-Cath  . aspirin EC  162 mg Oral Daily  . atenolol  25 mg Oral Daily  . [START ON 07/20/2012] diazepam  5 mg Oral On Call  . glimepiride  4 mg Oral BID WC  . isosorbide mononitrate  30 mg Oral Daily  . losartan  50 mg Oral BID  . potassium chloride  20 mEq Oral BID  . simvastatin  20 mg Oral QHS  . sodium chloride  3 mL Intravenous Q12H  . sodium chloride  3 mL Intravenous Q12H    Labs:  Recent Labs  07/17/12 2105 07/18/12 0114 07/18/12 0500 07/19/12 0520  NA  --  137 136 136  K  --  3.2* 3.3* 3.9  CL  --  102 101 101  CO2  --  24 22 21   GLUCOSE  --  328* 258* 242*  BUN  --  13 14 8   CREATININE  --  0.53 0.46* 0.42*  CALCIUM  --  8.5 8.2*  9.3  MG 1.5 1.3*  --   --     Recent Labs  07/18/12 0114  AST 13  ALT 19  ALKPHOS 51  BILITOT 0.3  PROT 6.1  ALBUMIN 3.4*   No results found for this basename: LIPASE, AMYLASE,  in the last 72 hours  Recent Labs  07/18/12 0114 07/18/12 0500 07/19/12 0520  WBC 7.8 7.9 6.9  NEUTROABS 4.0  --   --   HGB 12.2 11.7* 12.0  HCT 34.2* 34.5* 35.4*  MCV 87.7 89.1 87.4  PLT 192 198 210    Recent Labs  07/18/12 0107 07/18/12 0414 07/18/12 1137  TROPONINI <0.30 <0.30 <0.30   No components found with this basename: POCBNP,  No results found for this basename: DDIMER,  in the last 72 hours  Recent Labs  07/18/12 0114  HGBA1C 9.2*    Recent Labs  07/18/12 0500  CHOL 144  HDL 39*  LDLCALC UNABLE TO CALCULATE IF TRIGLYCERIDE OVER 400 mg/dL  TRIG 161*  CHOLHDL 3.7    Recent Labs  07/18/12 0114  TSH 2.447   No results found for this basename: VITAMINB12, FOLATE, FERRITIN, TIBC, IRON, RETICCTPCT,  in the last  72 hours  Radiology/Studies:  Dg Chest Port 1 View  07/17/2012  *RADIOLOGY REPORT*  Clinical Data: Chest pain, atrial fibrillation, history hypertension, diabetes, breast cancer  PORTABLE CHEST - 1 VIEW  Comparison: Portable exam 2125 hours without priors for comparison.  Findings: Normal heart size and pulmonary vascularity. Tortuous aorta. External pacing lead and EKG leads noted. Lungs grossly clear. No pleural effusion or pneumothorax. Surgical clips in the axillae bilaterally. Bones demineralized.  IMPRESSION: No acute abnormalities.   Original Report Authenticated By: Ulyses Southward, M.D.      Assessment and Plan  1 UAP  - NL Tns 2 CAD  - h/o remote MI/cath in 2004, but no PCI 3 PAF  - maintaining NSR on Amio 3 DM 4 Tobacco  - DC'd 03/2012  PLAN: Proceed with cardiac cath in AM. F/u echo from this AM.   Signed, SERPE, EUGENE PA-C  Agree with above. Exam reveals very soft systolic murmur at aortic area. 2 D echo just completed, results pending. No  further chest discomfort. Get followup EKG today to follow abnormal anterior T wave changes. Plan cath in am.

## 2012-07-21 ENCOUNTER — Other Ambulatory Visit: Payer: Self-pay | Admitting: Nurse Practitioner

## 2012-07-21 ENCOUNTER — Encounter (HOSPITAL_COMMUNITY): Payer: Self-pay | Admitting: Nurse Practitioner

## 2012-07-21 DIAGNOSIS — I255 Ischemic cardiomyopathy: Secondary | ICD-10-CM | POA: Diagnosis present

## 2012-07-21 DIAGNOSIS — I2510 Atherosclerotic heart disease of native coronary artery without angina pectoris: Secondary | ICD-10-CM | POA: Diagnosis present

## 2012-07-21 DIAGNOSIS — I480 Paroxysmal atrial fibrillation: Secondary | ICD-10-CM

## 2012-07-21 LAB — GLUCOSE, CAPILLARY: Glucose-Capillary: 261 mg/dL — ABNORMAL HIGH (ref 70–99)

## 2012-07-21 LAB — CBC
Hemoglobin: 13 g/dL (ref 12.0–15.0)
MCV: 88.6 fL (ref 78.0–100.0)
Platelets: 192 10*3/uL (ref 150–400)
RBC: 4.3 MIL/uL (ref 3.87–5.11)
WBC: 6.1 10*3/uL (ref 4.0–10.5)

## 2012-07-21 LAB — BASIC METABOLIC PANEL
CO2: 28 mEq/L (ref 19–32)
Chloride: 99 mEq/L (ref 96–112)
Glucose, Bld: 210 mg/dL — ABNORMAL HIGH (ref 70–99)
Sodium: 136 mEq/L (ref 135–145)

## 2012-07-21 LAB — URINE CULTURE: Colony Count: >=100000

## 2012-07-21 MED ORDER — ASPIRIN 81 MG PO TABS: 81.0000 mg | ORAL_TABLET | Freq: Every day | ORAL | Status: DC

## 2012-07-21 MED ORDER — NITROGLYCERIN 0.4 MG SL SUBL: 0.4000 mg | SUBLINGUAL_TABLET | SUBLINGUAL | Status: DC | PRN

## 2012-07-21 MED ORDER — POTASSIUM CHLORIDE CRYS ER 20 MEQ PO TBCR: 20.0000 meq | EXTENDED_RELEASE_TABLET | Freq: Every day | ORAL | Status: DC

## 2012-07-21 MED ORDER — METFORMIN HCL 1000 MG PO TABS: 1000.0000 mg | ORAL_TABLET | Freq: Two times a day (BID) | ORAL | Status: DC

## 2012-07-21 MED ORDER — AMIODARONE HCL 200 MG PO TABS
200.0000 mg | ORAL_TABLET | Freq: Two times a day (BID) | ORAL | Status: DC
  Administered 2012-07-21: 200 mg via ORAL
  Filled 2012-07-21 (×2): qty 1

## 2012-07-21 MED ORDER — AMIODARONE HCL 200 MG PO TABS: 200.0000 mg | ORAL_TABLET | Freq: Two times a day (BID) | ORAL | Status: DC

## 2012-07-21 MED ORDER — RIVAROXABAN 20 MG PO TABS: 20.0000 mg | ORAL_TABLET | Freq: Every day | ORAL | Status: DC

## 2012-07-21 MED ORDER — RIVAROXABAN 20 MG PO TABS
20.0000 mg | ORAL_TABLET | ORAL | Status: AC
  Administered 2012-07-21: 20 mg via ORAL
  Filled 2012-07-21: qty 1

## 2012-07-21 MED ORDER — CARVEDILOL 25 MG PO TABS: 25.0000 mg | ORAL_TABLET | Freq: Two times a day (BID) | ORAL | Status: DC

## 2012-07-21 MED ORDER — ASPIRIN EC 81 MG PO TBEC
81.0000 mg | DELAYED_RELEASE_TABLET | Freq: Every day | ORAL | Status: DC
  Administered 2012-07-21: 81 mg via ORAL
  Filled 2012-07-21: qty 1

## 2012-07-21 MED ORDER — RIVAROXABAN 20 MG PO TABS
20.0000 mg | ORAL_TABLET | Freq: Every day | ORAL | Status: DC
  Filled 2012-07-21: qty 1

## 2012-07-21 NOTE — Progress Notes (Signed)
Dc instructions given to patient at this time re: f/u appts, diet, activity, meds, s/s of problems, radial site care, mychart, and community resources.  IV and tele dc'd.

## 2012-07-21 NOTE — Progress Notes (Signed)
Subjective:  Patient underwent cardiac catheterization yesterday.  She was found to have diffuse 2 vessel coronary artery disease.  She did not require percutaneous intervention.  We will plan to do outpatient Myoview stress test to evaluate degree of reversible ischemia/viability.  She has paroxysmal atrial fibrillation and will be on Xarelto long-term. Objective:  Vital Signs in the last 24 hours: Temp:  [98.5 F (36.9 C)-98.9 F (37.2 C)] 98.9 F (37.2 C) (05/12 2022) Pulse Rate:  [70-74] 70 (05/12 2022) Resp:  [16-20] 18 (05/12 2022) BP: (122-158)/(63-83) 122/63 mmHg (05/12 2022) SpO2:  [99 %-100 %] 99 % (05/12 2022)  Intake/Output from previous day: 05/12 0701 - 05/13 0700 In: -  Out: 600 [Urine:600] Intake/Output from this shift:    . amiodarone  400 mg Oral BID  . aspirin EC  162 mg Oral Daily  . carvedilol  25 mg Oral BID WC  . glimepiride  4 mg Oral BID WC  . isosorbide mononitrate  30 mg Oral Daily  . losartan  50 mg Oral BID  . potassium chloride  20 mEq Oral BID  . simvastatin  20 mg Oral QHS  . sodium chloride  3 mL Intravenous Q12H      Physical Exam: The patient appears to be in no distress.  Head and neck exam reveals that the pupils are equal and reactive.  The extraocular movements are full.  There is no scleral icterus.  Mouth and pharynx are benign.  No lymphadenopathy.  No carotid bruits.  The jugular venous pressure is normal.  Thyroid is not enlarged or tender.  Chest is clear to percussion and auscultation.  No rales or rhonchi.  Expansion of the chest is symmetrical.  Heart reveals no abnormal lift or heave.  First and second heart sounds are normal.  There is no murmur gallop rub or click.  The abdomen is soft and nontender.  Bowel sounds are normoactive.  There is no hepatosplenomegaly or mass.  There are no abdominal bruits.  Extremities reveal no phlebitis or edema.  Pedal pulses are good.  There is no cyanosis or clubbing.  Right radial  pulse is normal post catheter  Neurologic exam is normal strength and no lateralizing weakness.  No sensory deficits.  Integument reveals no rash  Lab Results:  Recent Labs  07/20/12 0455 07/21/12 0430  WBC 9.8 6.1  HGB 12.9 13.0  PLT 203 192    Recent Labs  07/20/12 0455 07/21/12 0430  NA 135 136  K 4.1 4.4  CL 101 99  CO2 24 28  GLUCOSE 252* 210*  BUN 10 10  CREATININE 0.49* 0.60    Recent Labs  07/18/12 1137  TROPONINI <0.30   Hepatic Function Panel No results found for this basename: PROT, ALBUMIN, AST, ALT, ALKPHOS, BILITOT, BILIDIR, IBILI,  in the last 72 hours No results found for this basename: CHOL,  in the last 72 hours No results found for this basename: PROTIME,  in the last 72 hours  Imaging: No results found.  Cardiac Studies: Telemetry shows NSR and left bundle branch block pattern. Assessment/Plan:  1.  Paroxysmal atrial fibrillation.      Plan: Continue oral amiodarone.  Begin Xarelto 2.  Ischemic heart disease.  Remote MI cathed but did not require intervention in 2004.  Now has diffuse three-vessel coronary disease.  May require eventual CABG depending on results an outpatient Myoview. 3.  Diabetes.       Hold metformin for cath.  Renal function  normal post catheter.  Plan to restart her metformin tomorrow Plan: Home today.  LOS: 4 days    Cassell Clement 07/21/2012, 7:37 AM

## 2012-07-21 NOTE — Progress Notes (Signed)
Inpatient Diabetes Program Recommendations  AACE/ADA: New Consensus Statement on Inpatient Glycemic Control (2013)  Target Ranges:  Prepandial:   less than 140 mg/dL      Peak postprandial:   less than 180 mg/dL (1-2 hours)      Critically ill patients:  140 - 180 mg/dL   Results for KARRAH, MANGINI (MRN [id number]) as of 07/21/2012 10:42  Ref. Range 07/18/2012 01:14 07/18/2012 05:00 07/19/2012 05:20 07/20/2012 04:55 07/21/2012 04:30  Hemoglobin A1C Latest Range: <5.7 % 9.2 (H)      Glucose Latest Range: 70-99 mg/dL 119 (H) 147 (H) 829 (H) 252 (H) 210 (H)     Note: Note that patient is to be discharged today.  Patient takes oral agents for diabetes management as an outpatient. A1C on 07/18/12 was 9.2% which is indicative that diabetes control is not optimal.  Patient needs to follow up with PCP to discuss plan to improve glycemic control.     Thanks, Orlando Penner, RN, BSN, CCRN Diabetes Coordinator Inpatient Diabetes Program [phone]

## 2012-07-21 NOTE — Discharge Summary (Signed)
Patient ID: [name],  MRN: [id number], DOB/AGE: 1950/12/14 62 y.o.  Admit date: 07/17/2012 Discharge date: 07/21/2012  Primary Care Provider: TODD,JEFFREY ALLEN Primary Cardiologist: J. Hochrein, MD  Discharge Diagnoses Principal Problem:   PAF (paroxysmal atrial fibrillation)  **Amiodarone and Xarelto initiated this admission.  Active Problems:   CAD (coronary artery disease)  **Mulivessel diffuse CAD.  **Outpatient Cardiolite pending to determine ischemic burden of CAD.   DIABETES MELLITUS TYPE 1-UNCONTROLLED   Ischemic cardiomyopathy  **EF 35% by echo this admission.   HYPERLIPIDEMIA/HYPERTRIGLYCERIDEMIA   HYPERTENSION   Intermittent LBBB (noted while in Afib)  Allergies No Known Allergies  Procedures  2D Echocardiogram 5.11.2014  Study Conclusions  - Left ventricle: The cavity size was normal. There was mild   concentric hypertrophy. Systolic function was moderately   reduced. The estimated ejection fraction was 35%. Severe   hypokinesis of the inferoseptal myocardium. - Ventricular septum: Abnormal but not frankly paradoxical   septal motion. - Aortic valve: Mildly calcified annulus. Trileaflet. - Left atrium: The atrium was mildly dilated. - Atrial septum: No defect or patent foramen ovale was   identified. - Pulmonary arteries: PA peak pressure: 59mm Hg (S). _____________  Cardiac Catheterization 5.12.2014  Procedural Findings: Hemodynamics: AO 115/58 with a mean of 81 mmHg LV 117/18 mmHg  Coronary angiography: Coronary dominance: right  Left mainstem: There is diffuse narrowing of the left main to 30-40%. Left anterior descending (LAD): The left anterior descending artery is moderately calcified throughout the proximal to mid vessel. There is diffuse disease throughout the proximal to mid segment. The proximal vessel there is a to 70% stenosis. In the mid vessel there is diffuse 70% disease with one focal area of 80%. The first diagonal is a  moderate-sized vessel with an 80-90% stenosis proximally. Left circumflex (LCx): The left circumflex gives rise to 2 marginal vessels before terminating in a third marginal branch. The first marginal vessel is very small in caliber with diffuse disease up to 90%. The second marginal branch is a large branch which has an 80-90% stenosis proximally. This is followed by diffuse 50% disease in the mid vessel and a 70-80% stenosis distally after the bifurcation. Right coronary artery (RCA): The right coronary is a dominant vessel. It is mildly calcified proximally. There are diffuse irregularities in the vessel up to 20-30%. Left ventriculography: Left ventricular systolic function is abnormal, LVEF is estimated at 40%, there is global hypokinesis, there is no significant mitral regurgitation   Final Conclusions:   1. Diffuse 2 vessel obstructive coronary artery disease 2. Moderate left ventricular dysfunction.  Recommendations: We will optimize her medical therapy. Continue amiodarone for treatment of her atrial fibrillation. Given her high [place] score would recommend anticoagulation. Recommend ischemia/viability assessment as an outpatient. If this demonstrates significant ischemia/viability would recommend coronary bypass surgery. _____________  History of Present Illness  62 y/o female with prior history of MI in [place] in 2004, which was apparently medically managed.  She was in her usual state of health until the evening of 07/17/2012, when after eating [place] food, she developed tachypalpitations and chest heaviness.  She used 2 sublingual nitroglycerin sprays without significant improvement and her husband subsequently drove her to the [facility] ED for further evaluation.  There, she was found to be in a wide complex, irregular tachycardia, consistent with Atrial Fibrillation and a LBBB.  Cardiology was called for consultation and she was loaded with IV Amiodarone with conversion to sinus rhythm and  resolution of LBBB and chest pain.  ECG  did show anterior TWI after conversion to sinus rhythm.  She was admitted for further evaluation.  Hospital Course  Patient ruled out for MI.  She had no further afib or chest pain.  Amiodarone was converted from IV to oral and decision was made to pursue diagnostic catheterization.  2D echocardiogram was carried out on 5/11, revealing an EF of 35% with severe inferoseptal hypokinesis.  On 5/12, she underwent diagnostic catheterization revealing moderate to severe diffuse multivessel coronary artery disease.  As above, medical therapy has been recommended and we have arranged for follow-up lexiscan Cardiolite next week, to determine the ischemic significance of her CAD, at which point she will be considered for percutaneous intervention vs. Thoracic Surgery evaluation.  In light of her CHA2DS2VASc score of 5, we have initiated Xarelto therapy upon discharge.  She is being discharged home today in good condition.   Discharge Vitals Blood pressure 122/63, pulse 70, temperature 98.9 F (37.2 C), temperature source Oral, resp. rate 18, height 5' 4.5" (1.638 m), weight 155 lb (70.308 kg), SpO2 99.00%.  Filed Weights   07/17/12 2100 07/17/12 2318  Weight: 150 lb (68.04 kg) 155 lb (70.308 kg)   Labs  CBC  Recent Labs  07/20/12 0455 07/21/12 0430  WBC 9.8 6.1  HGB 12.9 13.0  HCT 37.4 38.1  MCV 87.6 88.6  PLT 203 192   Basic Metabolic Panel  Recent Labs  07/20/12 0455 07/21/12 0430  NA 135 136  K 4.1 4.4  CL 101 99  CO2 24 28  GLUCOSE 252* 210*  BUN 10 10  CREATININE 0.49* 0.60  CALCIUM 9.6 9.9   Liver Function Tests Lab Results  Component Value Date   ALT 19 07/18/2012   AST 13 07/18/2012   ALKPHOS 51 07/18/2012   BILITOT 0.3 07/18/2012   Cardiac Enzymes  Recent Labs  07/18/12 1137  TROPONINI <0.30   Hemoglobin A1C Lab Results  Component Value Date   HGBA1C 9.2* 07/18/2012   Fasting Lipid Panel Lab Results  Component Value  Date   CHOL 144 07/18/2012   HDL 39* 07/18/2012   LDLCALC UNABLE TO CALCULATE IF TRIGLYCERIDE OVER 400 mg/dL 1/61/0960   TRIG 454* 0/98/1191   CHOLHDL 3.7 07/18/2012    Thyroid Function Tests Lab Results  Component Value Date   TSH 2.447 07/18/2012     Disposition  Pt is being discharged home today in good condition.  Follow-up Plans & Appointments  Follow-up Information   Follow up with Tereso Newcomer, PA-C On 08/18/2012. (2:00 PM - Dr. Jenene Slicker PA)    Contact information:   [address] [place] [address] [phone]       Follow up with Selena Batten On 07/27/2012. (9:30 AM - Pharmacologic Stress Test.  Nothing to eat or drink after midnight.  No [place]aff[place] on 5/18.)    Contact information:   [address] [place] [address] [phone]      Follow up with TODD,JEFFREY Freida Busman, MD. (as scheduled.)    Contact information:   [address] Christena Flake [facility] [place] [address] [phone]      Discharge Medications    Medication List    STOP taking these medications       atenolol 25 MG tablet  Commonly known as:  TENORMIN      TAKE these medications       amiodarone 200 MG tablet  Commonly known as:  PACERONE  Take 1 tablet (200 mg total) by mouth 2 (  two) times daily.     aspirin 81 MG tablet  Take 1 tablet (81 mg total) by mouth daily.     carvedilol 25 MG tablet  Commonly known as:  COREG  Take 1 tablet (25 mg total) by mouth 2 (two) times daily with a meal.     glimepiride 4 MG tablet  Commonly known as:  AMARYL  Take 4 mg by mouth 2 (two) times daily.     isosorbide mononitrate 30 MG 24 hr tablet  Commonly known as:  IMDUR  Take 1 tablet (30 mg total) by mouth daily.     losartan 50 MG tablet  Commonly known as:  COZAAR  Take 50 mg by mouth 2 (two) times daily.     metFORMIN 1000 MG tablet - To be resumed 5/14.  Commonly known as:  GLUCOPHAGE  Take 1 tablet (1,000 mg total) by mouth 2 (two) times daily  with a meal.     nitroGLYCERIN 0.4 MG SL tablet  Commonly known as:  NITROSTAT  Place 1 tablet (0.4 mg total) under the tongue every 5 (five) minutes as needed for chest pain.     ONE TOUCH ULTRA SYSTEM KIT W/DEVICE Kit  1 kit by Does not apply route once.     ONETOUCH ULTRA BLUE VI  by In Vitro route daily.     potassium chloride SA 20 MEQ tablet  Commonly known as:  K-DUR,KLOR-CON  Take 1 tablet (20 mEq total) by mouth daily.     Rivaroxaban 20 MG Tabs  Commonly known as:  XARELTO  Take 1 tablet (20 mg total) by mouth daily with supper.  Start taking on:  07/22/2012     simvastatin 20 MG tablet  Commonly known as:  ZOCOR  Take 20 mg by mouth at bedtime.       Outstanding Labs/Studies  [employer] pending next week.  Duration of Discharge Encounter   Greater than 30 minutes including physician time.  Signed, Nicolasa Ducking NP 07/21/2012, 9:37 AM

## 2012-07-22 ENCOUNTER — Encounter: Payer: PRIVATE HEALTH INSURANCE | Admitting: Family Medicine

## 2012-07-22 ENCOUNTER — Other Ambulatory Visit: Payer: Self-pay | Admitting: Family Medicine

## 2012-07-22 MED ORDER — SULFAMETHOXAZOLE-TRIMETHOPRIM 800-160 MG PO TABS
1.0000 | ORAL_TABLET | Freq: Two times a day (BID) | ORAL | Status: DC
Start: 2012-07-22 — End: 2012-08-31

## 2012-07-23 ENCOUNTER — Telehealth: Payer: Self-pay | Admitting: *Deleted

## 2012-07-23 NOTE — Telephone Encounter (Signed)
TCM. Pt Left a message to call back.

## 2012-07-24 NOTE — Telephone Encounter (Signed)
LMTCB

## 2012-07-24 NOTE — Telephone Encounter (Signed)
Patient contacted regarding discharge from hospital on 07/21/12.  Patient understands to follow up with provider Tereso Newcomer, PA-C on 6/10  Patient understands discharge instructions? yes  Patient understands medications and regiment? yes Patient understands to bring all medications to this visit? yes  Patient states she is not sleeping well.  I advised patient that she may still be recovering from altered sleep cycle while in the hospital.  Patient informed to call the provider on call over the weekend if she is not feeling better over the weekend.  Caryn Bee from [facility] will reschedule patient's f/u appointment. Patient denies any additional complaints at this time.

## 2012-07-27 ENCOUNTER — Ambulatory Visit (HOSPITAL_COMMUNITY): Payer: BC Managed Care – PPO | Attending: Cardiology | Admitting: Radiology

## 2012-07-27 ENCOUNTER — Encounter: Payer: Self-pay | Admitting: Physician Assistant

## 2012-07-27 ENCOUNTER — Ambulatory Visit (INDEPENDENT_AMBULATORY_CARE_PROVIDER_SITE_OTHER): Payer: BC Managed Care – PPO | Admitting: Physician Assistant

## 2012-07-27 VITALS — BP 122/68 | HR 69 | Ht 64.5 in | Wt 155.8 lb

## 2012-07-27 VITALS — BP 124/69 | HR 67 | Ht 64.5 in | Wt 154.0 lb

## 2012-07-27 DIAGNOSIS — R110 Nausea: Secondary | ICD-10-CM | POA: Insufficient documentation

## 2012-07-27 DIAGNOSIS — E109 Type 1 diabetes mellitus without complications: Secondary | ICD-10-CM | POA: Insufficient documentation

## 2012-07-27 DIAGNOSIS — Z794 Long term (current) use of insulin: Secondary | ICD-10-CM | POA: Insufficient documentation

## 2012-07-27 DIAGNOSIS — R000 Tachycardia, unspecified: Secondary | ICD-10-CM | POA: Insufficient documentation

## 2012-07-27 DIAGNOSIS — I10 Essential (primary) hypertension: Secondary | ICD-10-CM

## 2012-07-27 DIAGNOSIS — E785 Hyperlipidemia, unspecified: Secondary | ICD-10-CM

## 2012-07-27 DIAGNOSIS — I252 Old myocardial infarction: Secondary | ICD-10-CM | POA: Insufficient documentation

## 2012-07-27 DIAGNOSIS — R0602 Shortness of breath: Secondary | ICD-10-CM

## 2012-07-27 DIAGNOSIS — E1065 Type 1 diabetes mellitus with hyperglycemia: Secondary | ICD-10-CM

## 2012-07-27 DIAGNOSIS — R079 Chest pain, unspecified: Secondary | ICD-10-CM

## 2012-07-27 DIAGNOSIS — I2510 Atherosclerotic heart disease of native coronary artery without angina pectoris: Secondary | ICD-10-CM

## 2012-07-27 DIAGNOSIS — I4891 Unspecified atrial fibrillation: Secondary | ICD-10-CM

## 2012-07-27 DIAGNOSIS — IMO0002 Reserved for concepts with insufficient information to code with codable children: Secondary | ICD-10-CM

## 2012-07-27 DIAGNOSIS — I480 Paroxysmal atrial fibrillation: Secondary | ICD-10-CM

## 2012-07-27 DIAGNOSIS — R002 Palpitations: Secondary | ICD-10-CM | POA: Insufficient documentation

## 2012-07-27 DIAGNOSIS — I2589 Other forms of chronic ischemic heart disease: Secondary | ICD-10-CM

## 2012-07-27 DIAGNOSIS — R0609 Other forms of dyspnea: Secondary | ICD-10-CM | POA: Insufficient documentation

## 2012-07-27 DIAGNOSIS — I259 Chronic ischemic heart disease, unspecified: Secondary | ICD-10-CM

## 2012-07-27 DIAGNOSIS — R0789 Other chest pain: Secondary | ICD-10-CM | POA: Insufficient documentation

## 2012-07-27 DIAGNOSIS — R0989 Other specified symptoms and signs involving the circulatory and respiratory systems: Secondary | ICD-10-CM | POA: Insufficient documentation

## 2012-07-27 DIAGNOSIS — Z8249 Family history of ischemic heart disease and other diseases of the circulatory system: Secondary | ICD-10-CM | POA: Insufficient documentation

## 2012-07-27 DIAGNOSIS — Z87891 Personal history of nicotine dependence: Secondary | ICD-10-CM | POA: Insufficient documentation

## 2012-07-27 DIAGNOSIS — I447 Left bundle-branch block, unspecified: Secondary | ICD-10-CM | POA: Insufficient documentation

## 2012-07-27 MED ORDER — TECHNETIUM TC 99M SESTAMIBI GENERIC - CARDIOLITE
11.0000 | Freq: Once | INTRAVENOUS | Status: AC | PRN
  Administered 2012-07-27: 11 via INTRAVENOUS

## 2012-07-27 MED ORDER — ADENOSINE (DIAGNOSTIC) 3 MG/ML IV SOLN
0.5600 mg/kg | Freq: Once | INTRAVENOUS | Status: AC
  Administered 2012-07-27: 39 mg via INTRAVENOUS

## 2012-07-27 MED ORDER — ISOSORBIDE MONONITRATE ER 60 MG PO TB24: 60.0000 mg | ORAL_TABLET | Freq: Every day | ORAL | Status: DC

## 2012-07-27 MED ORDER — TECHNETIUM TC 99M SESTAMIBI GENERIC - CARDIOLITE
33.0000 | Freq: Once | INTRAVENOUS | Status: AC | PRN
  Administered 2012-07-27: 33 via INTRAVENOUS

## 2012-07-27 MED ORDER — ATORVASTATIN CALCIUM 80 MG PO TABS: 80.0000 mg | ORAL_TABLET | Freq: Every day | ORAL | Status: DC

## 2012-07-27 MED ORDER — AMIODARONE HCL 200 MG PO TABS: 200.0000 mg | ORAL_TABLET | Freq: Every day | ORAL | Status: DC

## 2012-07-27 NOTE — Progress Notes (Signed)
[facility] SITE 3 NUCLEAR MED [address] [place], [place] [address] [phone]    Cardiology Nuclear Med Study  [name] is a 62 y.o. female     MRN : [id number]     DOB: [birth year]  Procedure Date: 07/27/2012  Nuclear Med Background Indication for Stress Test:  Evaluation for Ischemic Burden of LAD and LCX;  Post Hospital on 07/17/12 with CP and New Atrial Fibrillation/RVR and Intermittent LBBB History:  '04 MI; 07/19/12 Echo:EF=35%, severe infero-septal HK; 07/20/12 Cath:multivessel CAD, EF=40%; h/o Chemo Cardiac Risk Factors: Family History - CAD, History of Smoking, Hypertension, IDDM Type 1 and Lipids  Symptoms:  Chest Pressure.  (last episode of chest discomfort was the Friday after discharge), DOE, Fatigue, Nausea, Palpitations and Rapid HR   Nuclear Pre-Procedure Caffeine/Decaff Intake:  None NPO After: 8:30pm   Lungs:  Clear. O2 Sat: 100% on room air. IV 0.9% NS with Angio Cath:  22g  IV Site: R Antecubital  IV Started by:  Milana Na, EMT-P  Chest Size (in):  40 Cup Size: B  Height: 5' 4.5" (1.638 m)  Weight:  154 lb (69.854 kg)  BMI:  Body mass index is 26.04 kg/(m^2). Tech Comments:  No Diabetic Rx this am    Nuclear Med Study 1 or 2 day study: 1 day  Stress Test Type:  Adenosine  Reading MD: Willa Rough, MD  Order Authorizing Provider:  Rollene Rotunda MD  Resting Radionuclide: Technetium 49m Sestamibi  Resting Radionuclide Dose: 11.0 mCi   Stress Radionuclide:  Technetium 24m Sestamibi  Stress Radionuclide Dose: 33.0 mCi           Stress Protocol Rest HR: 67 Stress HR: 76  Rest BP: 124/69 Stress BP: 130/63  Exercise Time (min): n/a METS: n/a   Predicted Max HR: 160 bpm % Max HR: 47.5 bpm Rate Pressure Product: 9880   Dose of Adenosine (mg):  39.2 Dose of Lexiscan: n/a mg  Dose of Atropine (mg): n/a Dose of Dobutamine: n/a mcg/kg/min (at max HR)  Stress Test Technologist: Smiley Houseman, CMA-N  Nuclear Technologist:  Domenic Polite, CNMT     Rest Procedure:  Myocardial perfusion imaging was performed at rest 45 minutes following the intravenous administration of Technetium 37m Sestamibi.  Rest ECG: normal sinus rhythm with left bundle branch block  Stress Procedure:  The patient received IV adenosine at 140 mcg/kg/min for 4 minutes.  She did c/o chest pressure with infusion.  Technetium 58m Sestamibi was injected at the 2 minute mark and quantitative spect images were obtained after a 45 minute delay.  Stress ECG: No significant change from baseline ECG  QPS Raw Data Images:  Patient motion noted; appropriate software correction applied. Stress Images:  There is a small area of mild decreased uptake affecting the apical anterior segment and the apical cap Rest Images:  There is very slight decreased activity in a very small area affecting the apical anterior segment and the apical cap Subtraction (SDS):  There is very slight reversibility in the areas of the apical anterior segment and the apical cap Transient Ischemic Dilatation (Normal <1.22):  1.05 Lung/Heart Ratio (Normal <0.45):  0.25  Quantitative Gated Spect Images QGS EDV:  109 ml QGS ESV:  52 ml  Impression Exercise Capacity:  Adenosine study with no exercise. BP Response:  Normal blood pressure response. Clinical Symptoms:  chest pressure ECG Impression:  No significant ST segment change suggestive of ischemia. Comparison with Prior Nuclear Study: No images to compare  Overall Impression:  It is known that this patient has an intermittent left bundle branch block. Today the patient had sinus rhythm and left bundle branch block. The images show a small area of decreased uptake in the apical anterior segment and apical cap. There is question that there could be slight ischemia in this area. There are wall motion abnormalities that would go along with the patient's left bundle branch block. It is possible that all of the findings are related to the  left bundle-branch block. I cannot absolutely rule out the possibility of slight ischemia in the apical anterior segment.  LV Ejection Fraction: 52%.  LV Wall Motion:  There is dyssynergy of the septum it extends to the apex.  Willa Rough, MD

## 2012-07-27 NOTE — Patient Instructions (Addendum)
Your physician has recommended you make the following change in your medication:  1) Increase Imdur to ( 60 mg ) daily 2) Discontinue Zocor 3) Start Lipitor ( 80 mg ) daily, sent in to your pharmacy 4) Decrease Amiodarone to one tablet ( 200 mg ) daily starting Saturday 5/24   Your physician recommends that you return for a FASTING lipid profile/lfts on June 27 walk in from 8 to 5  Your physician recommends that you keep your  follow-up appointment with Tereso Newcomer, PA-C and Dr. Antoine Poche will be in the office on Tuesday, 6/10 at 3:20

## 2012-07-27 NOTE — Progress Notes (Signed)
[address] [place], [place]  [address] Phone: [phone] Fax:  [phone]  Date:  07/27/2012   ID:  Alexei Ey, DOB 1950/06/29, MRN [id number]  PCP:  Evette Georges, MD  Primary Cardiologist:  Dr. Rollene Rotunda     History of Present Illness: [name] is a 62 y.o. female who returns for f/u after a recent admission to the hospital 5/9-5/13 with AFib with RVR.  She has a prior hx of CAD, s/p MI in [place] in 2004 apparently tx medically, DM1, HTN, HL.  She presented to the ED with palps and CP.  She was noted to have a WCT c/w AFib in setting of LBBB.  She converted to NSR on IV amiodarone.  MI was r/o.  Echo 07/19/12: Mild LVH, EF 35%, inferoseptal HK, mild LAE, PASP 59.  LHC 07/20/12: LM 30-40%, pLAD 70%, mLAD 70%/80%, pD1 80-90%, small OM1 90%, large OM2 proximal 80-90%, mid OM2 50%, distal OM2 70-80%, dominant RCA 20-30%, EF 40%.  Medical therapy was recommended. She was placed on anticoagulation with Xarelto (CHADS2-VASc=5).  She was to have an outpatient nuclear study to assess for viability/ischemic burden to determine whether or not she would need bypass surgery. Her Myoview is scheduled for later today.  Since d/c, she has had one episode of CP described as heaviness.  She notes some palpitations associated.  She took NTG x 2 with relief.  She notes hx of exertional CP since prior to admission.  She likely describes CCS Class III angina.  She notes assoc dyspnea with her CP.  No orthopnea, PND, edema.  No syncope.    Labs (5/14):  K 4.4, Cr. 0.60, ALT 19, TC 144, TG 530, HDL 39, LDL n/c, Hgb 13, TSH 2.447  Wt Readings from Last 3 Encounters:  07/27/12 155 lb 12.8 oz (70.67 kg)  07/17/12 155 lb (70.308 kg)  07/17/12 155 lb (70.308 kg)     Past Medical History  Diagnosis Date  . Diabetes mellitus type II   . Hyperlipidemia   . Hypertension   . Allergic rhinitis   . Breast cancer 2002    left  . Cataract   . CAD (coronary artery disease)    a. 2004: s/p MI in [place]. No PCI->Medical RX;  b. 07/2012 Cath: LM 30-40, LAD 70p, 70/39m, D1 80-90p, OM1 small 90p, OM2 large 80-90p, 83m, 70-80d, RCA 20-30 diff, EF 40%, glob HK.  . Ischemic cardiomyopathy     a. 07/2012 Echo: EF 35%, Sev inferoseptal HK, mildly dil LA, Peak PASP .  Marland Kitchen PAF (paroxysmal atrial fibrillation)     a. 07/2012: Amio and xarelto initiated.  Marland Kitchen LBBB (left bundle branch block)     a. intermittent - present during rapid afib 07/2012.    Current Outpatient Prescriptions  Medication Sig Dispense Refill  . amiodarone (PACERONE) 200 MG tablet Take 1 tablet (200 mg total) by mouth 2 (two) times daily.  60 tablet  3  . aspirin 81 MG tablet Take 1 tablet (81 mg total) by mouth daily.  30 tablet    . Blood Glucose Monitoring Suppl (ONE TOUCH ULTRA SYSTEM KIT) W/DEVICE KIT 1 kit by Does not apply route once.        . carvedilol (COREG) 25 MG tablet Take 1 tablet (25 mg total) by mouth 2 (two) times daily with a meal.  60 tablet  6  . glimepiride (AMARYL) 4 MG tablet Take 4 mg by mouth 2 (two) times daily.      Marland Kitchen  Glucose Blood (ONETOUCH ULTRA BLUE VI) by In Vitro route daily.        . isosorbide mononitrate (IMDUR) 30 MG 24 hr tablet Take 1 tablet (30 mg total) by mouth daily.  100 tablet  2  . losartan (COZAAR) 50 MG tablet Take 50 mg by mouth 2 (two) times daily.      . metFORMIN (GLUCOPHAGE) 1000 MG tablet Take 1 tablet (1,000 mg total) by mouth 2 (two) times daily with a meal.      . nitroGLYCERIN (NITROSTAT) 0.4 MG SL tablet Place 1 tablet (0.4 mg total) under the tongue every 5 (five) minutes as needed for chest pain.  25 tablet  3  . potassium chloride SA (K-DUR,KLOR-CON) 20 MEQ tablet Take 1 tablet (20 mEq total) by mouth daily.  30 tablet  6  . Rivaroxaban (XARELTO) 20 MG TABS Take 1 tablet (20 mg total) by mouth daily with supper.  30 tablet  6  . simvastatin (ZOCOR) 20 MG tablet Take 20 mg by mouth at bedtime.      . sulfamethoxazole-trimethoprim (BACTRIM DS,SEPTRA  DS) 800-160 MG per tablet Take 1 tablet by mouth 2 (two) times daily.  20 tablet  0   No current facility-administered medications for this visit.    Allergies:   No Known Allergies  Social History:  The patient  reports that she quit smoking about 4 months ago. Her smoking use included Cigarettes. She has a 10 pack-year smoking history. She has never used smokeless tobacco. She reports that she does not drink alcohol or use illicit drugs.   ROS:  Please see the history of present illness.   She notes occasional nausea especially in the AM.   All other systems reviewed and negative.   PHYSICAL EXAM: VS:  BP 122/68  Pulse 69  Ht 5' 4.5" (1.638 m)  Wt 155 lb 12.8 oz (70.67 kg)  BMI 26.34 kg/m2 Well nourished, well developed, in no acute distress HEENT: normal Neck: no JVD Cardiac:  normal S1, S2; RRR; no murmur Lungs:  clear to auscultation bilaterally, no wheezing, rhonchi or rales Abd: soft, nontender, no hepatomegaly Ext: no edema; right wrist without hematoma or bruit  Skin: warm and dry Neuro:  CNs 2-12 intact, no focal abnormalities noted  EKG:  NSR, HR 69, LBBB    ASSESSMENT AND PLAN:  1. CAD:  She is scheduled for nuclear stress test today to assess for viability and ischemic burden.  She has had symptoms before hospitalization and after that sound c/w CCS Class III angina.  I will adjust her Imdur to 60 mg QD.  Continue ASA, statin.  Will await stress test results.  She may need referral to [facility] for possible CABG.  I reviewed this with the patient and her husband. 2. Ischemic Cardiomyopathy:  Volume stable.  Continue ARB, beta blocker.   3. Atrial Fibrillation:  Maintaining NSR.  Suspect her nausea is related to amiodarone.  TSH and LFTs ok in the hospital.  She can reduce her Amiodarone to 200 mg QD after total of 2 weeks on this dose.  She should remain on anticoagulation with Xarelto.  4. Hypertension:  Controlled.  5. Hyperlipidemia:  With need to take amiodarone, she  should only be on 10 mg of simvastatin.  She would benefit from high dose statin.  D/c simvastatin.  Start Lipitor 80 mg QD.  6. Disposition:  F/u with me or Dr. Rollene Rotunda in 2 weeks.   Signed, Tereso Newcomer, PA-C  8:33 AM 07/27/2012

## 2012-07-28 ENCOUNTER — Telehealth: Payer: Self-pay | Admitting: Physician Assistant

## 2012-07-28 NOTE — Telephone Encounter (Signed)
New problem    Calling for results from yesterday

## 2012-07-28 NOTE — Telephone Encounter (Signed)
Spoke with patient regarding stress test results.  I informed patient that the study has not been read yet and that we will call her as soon as the results are available.

## 2012-07-29 NOTE — Telephone Encounter (Signed)
Follow up   Calling about previous message. Pt want to know if her results has been read.

## 2012-07-30 NOTE — Telephone Encounter (Signed)
Dr Antoine Poche has reviewed the stress test and pt will be notified that he wants to have her seen in the office and for now continue medical management.

## 2012-07-30 NOTE — Telephone Encounter (Signed)
Pt is upset because she has not been able to get the stress test results. Pt is aware that the stress test results were not routed to the appropriate MD for interpretation. Nuclear medicine is aware and will sent stress test results  to Dr. Antoine Poche . MD's nurse will call pt with results as soon as possible.

## 2012-07-30 NOTE — Telephone Encounter (Signed)
New Problem:    Patient called in wanting to know what the status of her test results were.  Please call back either before 10:30am or after 12:00pm.

## 2012-08-11 ENCOUNTER — Ambulatory Visit (INDEPENDENT_AMBULATORY_CARE_PROVIDER_SITE_OTHER): Payer: BC Managed Care – PPO | Admitting: Cardiovascular Disease

## 2012-08-11 ENCOUNTER — Encounter: Payer: Self-pay | Admitting: Cardiovascular Disease

## 2012-08-11 VITALS — BP 138/86 | HR 70 | Resp 16 | Ht 64.5 in | Wt 159.2 lb

## 2012-08-11 DIAGNOSIS — E1065 Type 1 diabetes mellitus with hyperglycemia: Secondary | ICD-10-CM

## 2012-08-11 DIAGNOSIS — R0989 Other specified symptoms and signs involving the circulatory and respiratory systems: Secondary | ICD-10-CM

## 2012-08-11 DIAGNOSIS — I259 Chronic ischemic heart disease, unspecified: Secondary | ICD-10-CM

## 2012-08-11 DIAGNOSIS — I480 Paroxysmal atrial fibrillation: Secondary | ICD-10-CM

## 2012-08-11 DIAGNOSIS — I4891 Unspecified atrial fibrillation: Secondary | ICD-10-CM

## 2012-08-11 DIAGNOSIS — E785 Hyperlipidemia, unspecified: Secondary | ICD-10-CM

## 2012-08-11 DIAGNOSIS — IMO0002 Reserved for concepts with insufficient information to code with codable children: Secondary | ICD-10-CM

## 2012-08-11 DIAGNOSIS — I2510 Atherosclerotic heart disease of native coronary artery without angina pectoris: Secondary | ICD-10-CM

## 2012-08-11 HISTORY — DX: Other specified symptoms and signs involving the circulatory and respiratory systems: R09.89

## 2012-08-11 MED ORDER — ISOSORBIDE MONONITRATE ER 60 MG PO TB24: 60.0000 mg | ORAL_TABLET | Freq: Two times a day (BID) | ORAL | Status: DC

## 2012-08-11 NOTE — Assessment & Plan Note (Signed)
Schedule duplex ultrasonography prior to her CV surgery evaluation appointment

## 2012-08-11 NOTE — Progress Notes (Signed)
Patient ID: [name], female   DOB: 09/30/1950, 62 y.o.   MRN: [id number]  Reason for office visit Establish new cardiology followup Coronary artery disease  As is my first encounter with Mrs. Callanan is a patient, although I haven't met her before; she is a total of one of our other patients and has accompanied her mother-in-law to office visits.  Mrs. Woolston has recently undergone extensive evaluation for coronary problems outside our practice. She felt that her care was not delivered in a prompt fashion so she is seeking to reestablish care in our office.  Mrs. Cosner has diabetes mellitus type 2, hyperlipidemia and hypertension and had a myocardial infarction in [place] roughly 10 years ago. This was apparently treated with conservative means.   She was recently admitted to [facility] with atrial fibrillation and rapid ventricular response. She has a new onset left bundle branch block and there was initial concern about ventricular tachycardia. She converted to normal sinus rhythm with treatment with amiodarone. There was no evidence of an acute ischemic injury by biochemical markers.  She subsequently undergone 3 different cardiac imaging studies. Her echocardiogram described a left ventricular ejection fraction of 35% with inferoseptal hypokinesia and moderate pulmonary hypertension with an estimated PA pressure of 59 mm Hg. The left atrium was described as mildly enlarged and there was mild left ventricular hypertrophy. She subsequently underwent cardiac catheterization. Her left ventricular ejection fraction was estimated to be around 40%. She had multiple coronary lesions most significantly sequential 70-80% stenoses in the proximal LAD artery, 80-90% stenosis in the proximal first diagonal, a 90% stenosis in the major (second) oblique marginal, but only a 20% stenosis in the dominant right coronary artery. I think there was uncertainty about the viability of the areas of dysfunctional  myocardium so she was scheduled for a nuclear study. This was performed as an outpatient. They showed only a very small area of reduced perfusion (mostly fixed, possibly mild ischemia) in the apex. The ejection fraction was estimated to be much better at 52%.  She has not had any clinical recurrence of atrial fibrillation. She is currently receiving treatment with amiodarone but at a lower dose (the initial loading dose caused nausea). She is on anticoagulation therapy with rock cement. She has not had any bleeding problems. She has no history of stroke or tremors ischemic attack or other embolic events.  She has NYHA functional class II. She only expresses symptoms of shortness of breath and very mild chest tightness when she hurries. This most commonly occurs early in the morning when she is trying to get her whole household ready for the day. She has taken nitroglycerin sublingually on 2 occasions since her hospital discharge. It is hard to describe her angina. At times it seems that she has retrosternal mild chest tightness (chest tightness as the dominant complaint at the time of her infarction 2004), but other times she describes a tight sensation in her lower jaw bilaterally.  She continues to work full-time and does not feel in any way restricted by her symptoms.  Does note that she has undergone bilateral mastectomy and left axillary dissection but has never received chest radiation therapy. She did receive chemotherapy for breast cancer about 10 years ago.  No Known Allergies  Current Outpatient Prescriptions  Medication Sig Dispense Refill  . amiodarone (PACERONE) 200 MG tablet Take 1 tablet (200 mg total) by mouth daily.  60 tablet  3  . aspirin 81 MG tablet Take 1 tablet (81 mg  total) by mouth daily.  30 tablet    . atorvastatin (LIPITOR) 80 MG tablet Take 1 tablet (80 mg total) by mouth daily.  90 tablet  3  . Blood Glucose Monitoring Suppl (ONE TOUCH ULTRA SYSTEM KIT) W/DEVICE KIT 1  kit by Does not apply route once.        . carvedilol (COREG) 25 MG tablet Take 1 tablet (25 mg total) by mouth 2 (two) times daily with a meal.  60 tablet  6  . glimepiride (AMARYL) 4 MG tablet Take 4 mg by mouth 2 (two) times daily.      . Glucose Blood (ONETOUCH ULTRA BLUE VI) by In Vitro route daily.        . isosorbide mononitrate (IMDUR) 60 MG 24 hr tablet Take 1 tablet (60 mg total) by mouth 2 (two) times daily.  100 tablet  2  . losartan (COZAAR) 50 MG tablet Take 50 mg by mouth 2 (two) times daily.      . metFORMIN (GLUCOPHAGE) 1000 MG tablet Take 1 tablet (1,000 mg total) by mouth 2 (two) times daily with a meal.      . nitroGLYCERIN (NITROSTAT) 0.4 MG SL tablet Place 1 tablet (0.4 mg total) under the tongue every 5 (five) minutes as needed for chest pain.  25 tablet  3  . Rivaroxaban (XARELTO) 20 MG TABS Take 1 tablet (20 mg total) by mouth daily with supper.  30 tablet  6  . sulfamethoxazole-trimethoprim (BACTRIM DS,SEPTRA DS) 800-160 MG per tablet Take 1 tablet by mouth 2 (two) times daily.  20 tablet  0   No current facility-administered medications for this visit.    Past Medical History  Diagnosis Date  . Diabetes mellitus type II   . Hyperlipidemia   . Hypertension   . Allergic rhinitis   . Breast cancer 2002    left  . Cataract   . CAD (coronary artery disease)     a. 2004: s/p MI in [place]. No PCI->Medical RX;  b. 07/2012 Cath: LM 30-40, LAD 70p, 70/46m, D1 80-90p, OM1 small 90p, OM2 large 80-90p, 36m, 70-80d, RCA 20-30 diff, EF 40%, glob HK.  . Ischemic cardiomyopathy     a. 07/2012 Echo: EF 35%, Sev inferoseptal HK, mildly dil LA, Peak PASP .  Marland Kitchen PAF (paroxysmal atrial fibrillation)     a. 07/2012: Amio and xarelto initiated.  Marland Kitchen LBBB (left bundle branch block)     a. intermittent - present during rapid afib 07/2012.    Past Surgical History  Procedure Laterality Date  . Mastectomy  2002    bilateral  . Tubal ligation  1987  . Bladder surgery  2000  .  Right leg surgery  2001    torn ligaments and tendons x4 surgeries  . Appendectomy  1974  . Cholecystectomy  1974  . Abdominal hysterectomy  2000    Family History  Problem Relation Age of Onset  . Kidney failure Mother   . Heart disease Mother     Died mid 61s complications of diabetes  . Arthritis Other   . Cancer Other     colon, ovarian  . Diabetes Other   . Hyperlipidemia Other   . Kidney failure Other   . Colon cancer Sister     History   Social History  . Marital Status: Married    Spouse Name: N/A    Number of Children: 3  . Years of Education: N/A   Occupational History  . Not  on file.   Social History Main Topics  . Smoking status: Former Smoker -- 1.00 packs/day for 10 years    Types: Cigarettes    Quit date: 03/11/2012  . Smokeless tobacco: Never Used     Comment: Quit in January  . Alcohol Use: No  . Drug Use: No  . Sexually Active: Not on file   Other Topics Concern  . Not on file   Social History Narrative   Lives with husband and mother in law.      Review of systems: The patient specifically denies any chest pain at rest, dyspnea at rest, orthopnea, paroxysmal nocturnal dyspnea, syncope, palpitations, focal neurological deficits, intermittent claudication, lower extremity edema, unexplained weight gain, cough, hemoptysis or wheezing. Review of Systems  Constitutional: Negative for fever, chills, weight loss and diaphoresis.  HENT: Negative for nosebleeds, congestion and sore throat.   Eyes: Negative for blurred vision.  Respiratory: Negative for cough, hemoptysis and wheezing.   Gastrointestinal: Negative for heartburn, nausea, vomiting, abdominal pain, diarrhea, constipation, blood in stool and melena.  Genitourinary: Negative for dysuria, urgency, frequency and hematuria.  Musculoskeletal: Positive for joint pain.  Skin: Negative for itching and rash.  Neurological: Positive for headaches. Negative for dizziness, sensory change, speech  change, focal weakness, seizures and loss of consciousness.  Endo/Heme/Allergies: Negative for environmental allergies and polydipsia. Does not bruise/bleed easily.  Psychiatric/Behavioral: Negative for depression and memory loss. The patient is not nervous/anxious and does not have insomnia.     PHYSICAL EXAM BP 138/86  Pulse 70  Resp 16  Ht 5' 4.5" (1.638 m)  Wt 159 lb 3.2 oz (72.213 kg)  BMI 26.91 kg/m2  General: Alert, oriented x3, no distress Head: no evidence of trauma, PERRL, EOMI, no exophtalmos or lid lag, no myxedema, no xanthelasma; normal ears, nose and oropharynx Neck: normal jugular venous pulsations and no hepatojugular reflux; brisk carotid pulses without delay but there is a fairly loud right carotid bruit Chest: clear to auscultation, no signs of consolidation by percussion or palpation, normal fremitus, symmetrical and full respiratory excursions Cardiovascular: normal position and quality of the apical impulse, regular rhythm, normal first set and endoscopy split second heart sound, grade 1-2/6 early peaking systolic ejection murmur, no rubs or gallops Abdomen: no tenderness or distention, no masses by palpation, no abnormal pulsatility or arterial bruits, normal bowel sounds, no hepatosplenomegaly Extremities: no clubbing, cyanosis or edema; 2+ radial, ulnar and brachial pulses bilaterally; 2+ right femoral, posterior tibial and dorsalis pedis pulses; 2+ left femoral, posterior tibial and dorsalis pedis pulses; no subclavian or femoral bruits Neurological: grossly nonfocal Normal mood and affect  EKG: NSR, LBBB  Lipid Panel     Component Value Date/Time   CHOL 144 07/18/2012 0500   TRIG 530* 07/18/2012 0500   HDL 39* 07/18/2012 0500   CHOLHDL 3.7 07/18/2012 0500   VLDL UNABLE TO CALCULATE IF TRIGLYCERIDE OVER 400 mg/dL 9/56/2130 8657   LDLCALC UNABLE TO CALCULATE IF TRIGLYCERIDE OVER 400 mg/dL 8/46/9629 5284    BMET    Component Value Date/Time   NA 136  07/21/2012 0430   K 4.4 07/21/2012 0430   CL 99 07/21/2012 0430   CO2 28 07/21/2012 0430   GLUCOSE 210* 07/21/2012 0430   BUN 10 07/21/2012 0430   CREATININE 0.60 07/21/2012 0430   CALCIUM 9.9 07/21/2012 0430   GFRNONAA >90 07/21/2012 0430   GFRAA >90 07/21/2012 0430     ASSESSMENT AND PLAN  CAD (coronary artery disease) I think there is  substantial evidence that Mrs. Maclaren would benefit from multivessel coronary bypass surgery (at least LIMA to LAD, SVG to diagonal, SVG to OM 2). She has diabetes mellitus, multivessel coronary disease and depressed left ventricle systolic function. There is wide variation estimation of LVEF by various studies, but this could simply mean that there is a lot of ischemic myocardium in jeopardy especially in the territory of the LAD artery. I do not think she will be well served by percutaneous means do to the multifocal nature of her disease and the presence of diabetes mellitus. I also think that she would not do as well with medical therapy alone.  I referred her to cardiovascular surgery for evaluation. Until then she should undergo carotid duplex ultrasonography to evaluate the right carotid bruit.  Consideration should be given to performing a surgical/radiofrequency ablation for atrial fibrillation at the time of bypass surgery.  PAF (paroxysmal atrial fibrillation) I would recommend that she remain on the amiodarone for the time being, at least until full recovery from coronary bypass surgery. There is likely to be substantial risk of A. fib recurrence with open heart surgery. On the other hand, I am not sure that this is a appropriate drug for a young woman for long-term prevention of atrial fibrillation. Depending on the frequency of AF recurrence might consider no antiarrhythmic therapy at all versus a lower risk drug such as sotalol or Tikosyn. Multaq is relatively contra indicated.  HYPERLIPIDEMIA The major abnormalities are moderate to severe  hypertriglyceridemia and low HDL cholesterol, both of which should improve with better glycemic control. Weight loss and physical exercise also be recommended, especially after we resolve the problems of coronary insufficiency.  DIABETES MELLITUS TYPE 1-UNCONTROLLED Target a globin A1c of less than 7%.  Right carotid bruit Schedule duplex ultrasonography prior to her CV surgery evaluation appointment     Avneet Ashmore  Thurmon Fair, MD, [facility] [phone] office [phone] pager

## 2012-08-11 NOTE — Assessment & Plan Note (Signed)
Target a globin A1c of less than 7%.

## 2012-08-11 NOTE — Assessment & Plan Note (Signed)
I think there is substantial evidence that Mrs. Mikelson would benefit from multivessel coronary bypass surgery (at least LIMA to LAD, SVG to diagonal, SVG to OM 2). She has diabetes mellitus, multivessel coronary disease and depressed left ventricle systolic function. There is wide variation estimation of LVEF by various studies, but this could simply mean that there is a lot of ischemic myocardium in jeopardy especially in the territory of the LAD artery. I do not think she will be well served by percutaneous means do to the multifocal nature of her disease and the presence of diabetes mellitus. I also think that she would not do as well with medical therapy alone.  I referred her to cardiovascular surgery for evaluation. Until then she should undergo carotid duplex ultrasonography to evaluate the right carotid bruit.  Consideration should be given to performing a surgical/radiofrequency ablation for atrial fibrillation at the time of bypass surgery.

## 2012-08-11 NOTE — Patient Instructions (Addendum)
NEW MED INSTRUCTIONS:Take Isosorbide 60 mg twice daily, and stop potassium. Your physician has requested that you have a carotid duplex. This test is an ultrasound of the carotid arteries in your neck. It looks at blood flow through these arteries that supply the brain with blood. Allow one hour for this exam. There are no restrictions or special instructions. You have been referred to: [facility]. Your physician has requested that you regularly monitor and record your blood pressure readings at home. Please use the same machine at the same time of day to check your readings and record them to bring to your follow-up visit.Your physician recommends that you schedule a follow-up appointment in: 6 weeks.

## 2012-08-11 NOTE — Assessment & Plan Note (Signed)
The major abnormalities are moderate to severe hypertriglyceridemia and low HDL cholesterol, both of which should improve with better glycemic control. Weight loss and physical exercise also be recommended, especially after we resolve the problems of coronary insufficiency.

## 2012-08-11 NOTE — Assessment & Plan Note (Signed)
I would recommend that she remain on the amiodarone for the time being, at least until full recovery from coronary bypass surgery. There is likely to be substantial risk of A. fib recurrence with open heart surgery. On the other hand, I am not sure that this is a appropriate drug for a young woman for long-term prevention of atrial fibrillation. Depending on the frequency of AF recurrence might consider no antiarrhythmic therapy at all versus a lower risk drug such as sotalol or Tikosyn. Multaq is relatively contra indicated.

## 2012-08-12 ENCOUNTER — Ambulatory Visit: Payer: BC Managed Care – PPO | Admitting: Physician Assistant

## 2012-08-18 ENCOUNTER — Encounter: Payer: PRIVATE HEALTH INSURANCE | Admitting: Physician Assistant

## 2012-08-18 ENCOUNTER — Ambulatory Visit: Payer: BC Managed Care – PPO | Admitting: Physician Assistant

## 2012-08-27 ENCOUNTER — Ambulatory Visit (HOSPITAL_COMMUNITY)
Admission: RE | Admit: 2012-08-27 | Discharge: 2012-08-27 | Disposition: A | Discharge status: Home / Self Care | Payer: BC Managed Care – PPO | Source: Ambulatory Visit | Attending: Cardiovascular Disease | Admitting: Cardiovascular Disease

## 2012-08-27 DIAGNOSIS — R0989 Other specified symptoms and signs involving the circulatory and respiratory systems: Secondary | ICD-10-CM | POA: Insufficient documentation

## 2012-08-27 NOTE — Progress Notes (Signed)
Carotid Duplex Completed. Aigner Horseman, RDMS, RVT  

## 2012-08-31 ENCOUNTER — Ambulatory Visit (INDEPENDENT_AMBULATORY_CARE_PROVIDER_SITE_OTHER): Payer: BC Managed Care – PPO | Admitting: Physician Assistant

## 2012-08-31 ENCOUNTER — Encounter: Payer: Self-pay | Admitting: Physician Assistant

## 2012-08-31 VITALS — BP 158/100 | HR 72 | Ht 64.5 in | Wt 158.0 lb

## 2012-08-31 DIAGNOSIS — I2510 Atherosclerotic heart disease of native coronary artery without angina pectoris: Secondary | ICD-10-CM

## 2012-08-31 DIAGNOSIS — I200 Unstable angina: Secondary | ICD-10-CM | POA: Insufficient documentation

## 2012-08-31 DIAGNOSIS — R079 Chest pain, unspecified: Secondary | ICD-10-CM

## 2012-08-31 DIAGNOSIS — I259 Chronic ischemic heart disease, unspecified: Secondary | ICD-10-CM

## 2012-08-31 MED ORDER — ISOSORBIDE MONONITRATE ER 60 MG PO TB24: 90.0000 mg | ORAL_TABLET | Freq: Two times a day (BID) | ORAL | Status: DC

## 2012-08-31 NOTE — Assessment & Plan Note (Signed)
Patient is reporting a worsening fatigue and chest pressure which is cleaning up her cats litter box. Speech she been using sublingual nitroglycerin to help the pain.  She does have a carotid Dopplers completed and are waiting on the cardiologist who read the study.  We will go head and pushed to have her seen by CTS for possible coronary bypass grafting. Also increase her Imdur to 90 mg twice daily.

## 2012-08-31 NOTE — Patient Instructions (Addendum)
Will call and schedule consult with cardiothoracic surgeon.

## 2012-08-31 NOTE — Progress Notes (Signed)
Date:  08/31/2012   ID:  [name], DOB [birth year], MRN [id number]  PCP:  Evette Georges, MD  Primary Cardiologist:  Croitor[facility]    History of Present Illness: [name] is a 62 y.o. female with a history of type 2 diabetes mellitus, hyperlipidemia, hypertension, coronary artery disease with myocardial infarction in [place] approximately 10 years ago, breast cancer, left bundle branch block, paroxysmal atrial fibrillation-on amiodarone, ischemic cardiomyopathy ejection fraction 35% severe inferior septal hypokinesis mildly dilated left atrium peak PA pressure 59 mmHg.  She had cardiac catheterization 07/17/2012 by Dr. Peter [place]. This revealed diffuse two-vessel coronary disease(She had multiple coronary lesions most significantly sequential 70-80% stenoses in the proximal LAD artery, 80-90% stenosis in the proximal first diagonal, a 90% stenosis in the major (second) oblique marginal, but only a 20% stenosis in the dominant right coronary artery).  She then underwent perfusion imaging on 07/27/2012 which revealed a possibility of slight ischemia in the apical anterior segment.  Patient recently underwent carotid Dopplers, which still need to be read by the cardiologist, in anticipation of consult with Cardiothoracic Surgery.   She is noting worsening anginal symptoms and mild orthopnea.  She states she feels a fatigue and chest pressure when just cleaning her cat's litter box. The patient currently denies nausea, vomiting, fever, shortness of breath, dizziness, PND, cough, congestion, abdominal pain, hematochezia, melena, lower extremity edema.   Wt Readings from Last 3 Encounters:  08/31/12 158 lb (71.668 kg)  08/11/12 159 lb 3.2 oz (72.213 kg)  07/27/12 154 lb (69.854 kg)     Past Medical History  Diagnosis Date  . Diabetes mellitus type II   . Hyperlipidemia   . Hypertension   . Allergic rhinitis   . Breast cancer 2002    left  . Cataract   . CAD (coronary artery  disease)     a. 2004: s/p MI in [place]. No PCI->Medical RX;  b. 07/2012 Cath: LM 30-40, LAD 70p, 70/85m, D1 80-90p, OM1 small 90p, OM2 large 80-90p, 87m, 70-80d, RCA 20-30 diff, EF 40%, glob HK.  . Ischemic cardiomyopathy     a. 07/2012 Echo: EF 35%, Sev inferoseptal HK, mildly dil LA, Peak PASP .  Marland Kitchen PAF (paroxysmal atrial fibrillation)     a. 07/2012: Amio and xarelto initiated.  Marland Kitchen LBBB (left bundle branch block)     a. intermittent - present during rapid afib 07/2012.    Current Outpatient Prescriptions  Medication Sig Dispense Refill  . amiodarone (PACERONE) 200 MG tablet Take 1 tablet (200 mg total) by mouth daily.  60 tablet  3  . aspirin 81 MG tablet Take 1 tablet (81 mg total) by mouth daily.  30 tablet    . atorvastatin (LIPITOR) 80 MG tablet Take 1 tablet (80 mg total) by mouth daily.  90 tablet  3  . Blood Glucose Monitoring Suppl (ONE TOUCH ULTRA SYSTEM KIT) W/DEVICE KIT 1 kit by Does not apply route once.        . carvedilol (COREG) 25 MG tablet Take 1 tablet (25 mg total) by mouth 2 (two) times daily with a meal.  60 tablet  6  . glimepiride (AMARYL) 4 MG tablet Take 4 mg by mouth 2 (two) times daily.      . Glucose Blood (ONETOUCH ULTRA BLUE VI) by In Vitro route daily.        . isosorbide mononitrate (IMDUR) 60 MG 24 hr tablet Take 1.5 tablets (90 mg total) by mouth 2 (two) times daily.  100 tablet  2  . losartan (COZAAR) 50 MG tablet Take 50 mg by mouth 2 (two) times daily.      . metFORMIN (GLUCOPHAGE) 1000 MG tablet Take 1 tablet (1,000 mg total) by mouth 2 (two) times daily with a meal.      . nitroGLYCERIN (NITROSTAT) 0.4 MG SL tablet Place 1 tablet (0.4 mg total) under the tongue every 5 (five) minutes as needed for chest pain.  25 tablet  3  . Rivaroxaban (XARELTO) 20 MG TABS Take 1 tablet (20 mg total) by mouth daily with supper.  30 tablet  6   No current facility-administered medications for this visit.    Allergies:   No Known Allergies  Social History:  The  patient  reports that she quit smoking about 5 months ago. Her smoking use included Cigarettes. She has a 10 pack-year smoking history. She has never used smokeless tobacco. She reports that she does not drink alcohol or use illicit drugs.   Family History  Problem Relation Age of Onset  . Kidney failure Mother   . Heart disease Mother     Died mid 69s complications of diabetes  . Arthritis Other   . Cancer Other     colon, ovarian  . Diabetes Other   . Hyperlipidemia Other   . Kidney failure Other   . Colon cancer Sister      ROS:  Please see the history of present illness.  All other systems reviewed and negative.   PHYSICAL EXAM: VS:  BP 158/100  Pulse 72  Ht 5' 4.5" (1.638 m)  Wt 158 lb (71.668 kg)  BMI 26.71 kg/m2 Well nourished, well developed, in no acute distress HEENT: Pupils are equal round react to light accommodation extraocular movements are intact.  Neck: no JVDNo cervical lymphadenopathy. Cardiac: Regular rate and rhythm without murmurs rubs or gallops. Lungs:  Mild basilar rales. No wheeze or rhonchi Abd: soft, nontender, positive bowel sounds all quadrants, no hepatosplenomegaly Ext: no lower extremity edema.  2+ radial and dorsalis pedis pulses. Skin: warm and dry Neuro:  Grossly normal, strength 5 out of 5 equal in upper and lower extremity  EKG:  A left bundle branch block 72 beats per minute, QTC 503 ms   ASSESSMENT AND PLAN:  Problem List Items Addressed This Visit   Unstable angina     Patient is reporting a worsening fatigue and chest pressure which is cleaning up her cats litter box. Speech she been using sublingual nitroglycerin to help the pain.  She does have a carotid Dopplers completed and are waiting on the cardiologist who read the study.  We will go head and pushed to have her seen by CTS for possible coronary bypass grafting. Also increase her Imdur to 90 mg twice daily.    Relevant Medications      isosorbide mononitrate (IMDUR) 24 hr  tablet    Other Visit Diagnoses   Chest pain    -  Primary    Relevant Orders       EKG 12-Lead       Ambulatory referral to Cardiothoracic Surgery    Coronary disease        Relevant Medications       isosorbide mononitrate (IMDUR) 24 hr tablet

## 2012-09-03 ENCOUNTER — Institutional Professional Consult (permissible substitution) (INDEPENDENT_AMBULATORY_CARE_PROVIDER_SITE_OTHER): Payer: BC Managed Care – PPO | Admitting: Cardiothoracic Surgery

## 2012-09-03 ENCOUNTER — Encounter: Payer: Self-pay | Admitting: Cardiothoracic Surgery

## 2012-09-03 VITALS — BP 126/72 | HR 73 | Resp 16 | Ht 64.0 in | Wt 159.0 lb

## 2012-09-03 DIAGNOSIS — I2510 Atherosclerotic heart disease of native coronary artery without angina pectoris: Secondary | ICD-10-CM

## 2012-09-04 ENCOUNTER — Other Ambulatory Visit: Payer: BC Managed Care – PPO

## 2012-09-08 ENCOUNTER — Encounter: Payer: Self-pay | Admitting: Cardiothoracic Surgery

## 2012-09-08 NOTE — Progress Notes (Signed)
[address]       [place] [address]             [phone]                    Karuna Balducci [facility] Medical Record #[id number] Date of Birth: [birth year]  Referring: Thurmon Fair, MD Primary Care: Evette Georges, MD  Chief Complaint:    Chief Complaint  Patient presents with  . Chest Pain    eval for possible CABG...CARDIAC CATH 07/22/12.Marland KitchenMarland KitchenNUCLEAR STRESS TEST 07/27/17  . Coronary Artery Disease    History of Present Illness:      Mrs. Sensabaugh has recently undergone extensive evaluation for coronary problems by 2 different cardiology practices. Mrs. Mothershead has diabetes mellitus type 2, hyperlipidemia and hypertension and had evaluation in [place] roughly 10 years ago, it is unclear if this was a myocardial infarction or arrhythmia. This was apparently treated with conservative means. She was recently admitted to [facility] with atrial fibrillation and rapid ventricular response. She has a new onset left bundle branch block and there was initial concern about ventricular tachycardia. She converted to normal sinus rhythm with treatment with amiodarone. There was no evidence of an acute ischemic injury by biochemical markers.  She subsequently undergone 3 different cardiac imaging studies. Her echocardiogram described a left ventricular ejection fraction of 35% with inferoseptal hypokinesia and moderate pulmonary hypertension with an estimated PA pressure of 59 mm Hg. The left atrium was described as mildly enlarged and there was mild left ventricular hypertrophy. She subsequently underwent cardiac catheterization by Dr. Peter [place], maximizing her medical treatment and stress testing was recommended.  They showed only a very small area of reduced perfusion (mostly fixed, possibly mild ischemia) in the apex. The ejection fraction was estimated to be much better at 52%.  She has not had any clinical recurrence of atrial fibrillation. She is currently receiving  treatment with amiodarone but at a lower dose (the initial loading dose caused nausea). She has not had any bleeding problems. She has no history of stroke or tremors ischemic attack or other embolic events. She has NYHA functional class II. She only expresses symptoms of shortness of breath and very mild chest tightness when she hurries.  She has taken nitroglycerin sublingually since her hospital discharge.  Current Activity/ Functional Status:  Patient is independent with mobility/ambulation, transfers, ADL's, IADL's.  Zubrod Score: At the time of surgery this patient's most appropriate activity status/level should be described as: []  Normal activity, no symptoms [x]  Symptoms, fully ambulatory []  Symptoms, in bed less than or equal to 50% of the time []  Symptoms, in bed greater than 50% of the time but less than 100% []  Bedridden []  Moribund   Past Medical History  Diagnosis Date  . Diabetes mellitus type II   . Hyperlipidemia   . Hypertension   . Allergic rhinitis   . Cataract   . CAD (coronary artery disease)     a. 2004: s/p MI in [place]. No PCI->Medical RX;  b. 07/2012 Cath: LM 30-40, LAD 70p, 70/49m, D1 80-90p, OM1 small 90p, OM2 large 80-90p, 55m, 70-80d, RCA 20-30 diff, EF 40%, glob HK.  . Ischemic cardiomyopathy     a. 07/2012 Echo: EF 35%, Sev inferoseptal HK, mildly dil LA, Peak PASP .  Marland Kitchen PAF (paroxysmal atrial fibrillation)     a. 07/2012: Amio and xarelto initiated.  Marland Kitchen LBBB (left bundle branch block)     a.  intermittent - present during rapid afib 07/2012.  . Breast cancer 2002    Patient reports left breast cancer diagnosis in 2002 treated with bilateral mastectomy positive lymph nodes with left axillary dissection followed by chemotherapy of unknown type  . Neuromuscular disorder     Patient reports chronic numbness in the right foot related to previous surgery on the right leg and "nerve damage"    Past Surgical History  Procedure Laterality Date  .  Mastectomy  2002    bilateral  . Tubal ligation  1987  . Bladder surgery  2000  . Right leg surgery  2001    torn ligaments and tendons x4 surgeries  . Appendectomy  1974  . Cholecystectomy  1974  . Abdominal hysterectomy  2000  . Breast surgery      Bilateral mastectomy for left breast cancer unknown stage but patient reports positive nodes was treated with chemotherapy  . Abdominal hysterectomy      Family History  Problem Relation Age of Onset  . Kidney failure Mother   . Heart disease Mother     Died 61 complications of diabetes  . Arthritis Other   . Cancer Other     colon, ovarian  . Diabetes Other   . Hyperlipidemia Other   . Kidney failure Other   . Colon cancer Sister     History   Social History  . Marital Status: Married    Spouse Name: N/A    Number of Children: 3  . Years of Education: N/A   Occupational History  . Office work    Social History Main Topics  . Smoking status: Former Smoker -- 1.00 packs/day for 10 years    Types: Cigarettes    Quit date: 03/11/2012  . Smokeless tobacco: Never Used     Comment: Quit in January  . Alcohol Use: No  . Drug Use: No  . Sexually Active: Not on file   Other Topics Concern  . Not on file   Social History Narrative   Lives with husband and mother in law.      History  Smoking status  . Former Smoker -- 1.00 packs/day for 10 years  . Types: Cigarettes  . Quit date: 03/11/2012  Smokeless tobacco  . Never Used    Comment: Quit in January    History  Alcohol Use No     No Known Allergies  Current Outpatient Prescriptions  Medication Sig Dispense Refill  . amiodarone (PACERONE) 200 MG tablet Take 1 tablet (200 mg total) by mouth daily.  60 tablet  3  . aspirin 81 MG tablet Take 1 tablet (81 mg total) by mouth daily.  30 tablet    . atorvastatin (LIPITOR) 80 MG tablet Take 1 tablet (80 mg total) by mouth daily.  90 tablet  3  . Blood Glucose Monitoring Suppl (ONE TOUCH ULTRA SYSTEM KIT)  W/DEVICE KIT 1 kit by Does not apply route once.        . carvedilol (COREG) 25 MG tablet Take 1 tablet (25 mg total) by mouth 2 (two) times daily with a meal.  60 tablet  6  . glimepiride (AMARYL) 4 MG tablet Take 4 mg by mouth 2 (two) times daily.      . Glucose Blood (ONETOUCH ULTRA BLUE VI) by In Vitro route daily.        . isosorbide mononitrate (IMDUR) 60 MG 24 hr tablet Take 1.5 tablets (90 mg total) by mouth 2 (two) times  daily.  100 tablet  2  . losartan (COZAAR) 50 MG tablet Take 50 mg by mouth 2 (two) times daily.      . metFORMIN (GLUCOPHAGE) 1000 MG tablet Take 1 tablet (1,000 mg total) by mouth 2 (two) times daily with a meal.      . nitroGLYCERIN (NITROSTAT) 0.4 MG SL tablet Place 1 tablet (0.4 mg total) under the tongue every 5 (five) minutes as needed for chest pain.  25 tablet  3  . Rivaroxaban (XARELTO) 20 MG TABS Take 1 tablet (20 mg total) by mouth daily with supper.  30 tablet  6   No current facility-administered medications for this visit.       Review of Systems:     Cardiac Review of Systems: Y or N  Chest Pain Cove.Etienne    ]  Resting SOB [  y ] Exertional SOB  [  y]  Orthopnea Cove.Etienne  ]   Pedal Edema Cove.Etienne   ]    Palpitations [ y ] Syncope  [ n ]   Presyncope [ y  ]  General Review of Systems: [Y] = yes [  ]=no Constitional: recent weight change [  ]; anorexia [  ]; fatigue [ y ]; nausea [  ]; night sweats [  ]; fever [n  ]; or chills [ n ];                                                                                                                                          Dental: poor dentition[  ]; Last Dentist visit:   Eye : blurred vision [  ]; diplopia [   ]; vision changes [  ];  Amaurosis fugax[  ]; Resp: cough [ n ];  wheezing[n  ];  hemoptysis[ n ]; shortness of breath[y  ]; paroxysmal nocturnal dyspnea[ y ]; dyspnea on exertion[ y ]; or orthopnea[  ];  GI:  gallstones[  ], vomiting[  ];  dysphagia[  ]; melena[  ];  hematochezia [  ]; heartburn[  ];   Hx of   Colonoscopy[  ]; GU: kidney stones [  ]; hematuria[n  ];   dysuria [  ];  nocturia[  ];  history of     obstruction [  ]; urinary frequency [  ]             Skin: rash, swelling[  ];, hair loss[  ];  peripheral edema[  ];  or itching[  ]; Musculosketetal: myalgias[  ];  joint swelling[  ];  joint erythema[  ];  joint pain[  ];  back pain[  ];  Heme/Lymph: bruising[  ];  bleeding[  ];  anemia[  ];  Neuro: TIA[ n ];  headaches[n  ];  stroke[  ];  vertigo[  ];  seizures[  ];   paresthesias[  ];  difficulty walking[ chronic rt  leg numbness ];  Psych:depression[  ]; anxiety[  ];  Endocrine: diabetes[y  ];  thyroid dysfunction[ n ];  Immunizations: Flu [ y ]; Pneumococcal[ y ];  Other:  Physical Exam: BP 126/72  Pulse 73  Resp 16  Ht 5\' 4"  (1.626 m)  Wt 159 lb (72.122 kg)  BMI 27.28 kg/m2  SpO2 97%  General appearance: alert, cooperative, appears older than stated age and no distress Neurologic: intact Heart: regular rate and rhythm, S1, S2 normal, no murmur, click, rub or gallop and normal apical impulse Lungs: clear to auscultation bilaterally Abdomen: soft, non-tender; bowel sounds normal; no masses,  no organomegaly Extremities: extremities normal, atraumatic, no cyanosis or edema and Homans sign is negative, no sign of DVT Patient has no cervical or supraclavicular adenopathy There is a faint right carotid bruit   Diagnostic Studies & Laboratory data:     Recent Radiology Findings:  07/27/2012: nuclear stress test: Impression  Exercise Capacity: Adenosine study with no exercise.  BP Response: Normal blood pressure response.  Clinical Symptoms: chest pressure  ECG Impression: No significant ST segment change suggestive of ischemia.  Comparison with Prior Nuclear Study: No images to compare  Overall Impression: It is known that this patient has an intermittent left bundle branch block. Today the patient had sinus rhythm and left bundle branch block. The images show a small area  of decreased uptake in the apical anterior segment and apical cap. There is question that there could be slight ischemia in this area. There are wall motion abnormalities that would go along with the patient's left bundle branch block. It is possible that all of the findings are related to the left bundle-branch block. I cannot absolutely rule out the possibility of slight ischemia in the apical anterior segment.  LV Ejection Fraction: 52%. LV Wall Motion: There is dyssynergy of the septum it extends to the apex.  Willa Rough, MD     Recent Lab Findings: Lab Results  Component Value Date   WBC 6.1 07/21/2012   HGB 13.0 07/21/2012   HCT 38.1 07/21/2012   PLT 192 07/21/2012   GLUCOSE 210* 07/21/2012   CHOL 144 07/18/2012   TRIG 530* 07/18/2012   HDL 39* 07/18/2012   LDLDIRECT 134.3 09/19/2009   LDLCALC UNABLE TO CALCULATE IF TRIGLYCERIDE OVER 400 mg/dL 4/54/0981   ALT 19 1/91/4782   AST 13 07/18/2012   NA 136 07/21/2012   K 4.4 07/21/2012   CL 99 07/21/2012   CREATININE 0.60 07/21/2012   BUN 10 07/21/2012   CO2 28 07/21/2012   TSH 2.447 07/18/2012   INR 1.11 07/19/2012   HGBA1C 9.2* 07/18/2012   Cardiac Cath: Cardiac Catheterization Procedure Note  Name: Alyx Mcguirk  MRN: [id number]  DOB: Jul 09, 1950  Procedure: Left Heart Cath, Selective Coronary Angiography, LV angiography  Indication: 62 year old and an and and and a female with history of coronary disease, diabetes, hyperlipidemia, and tobacco abuse presents with symptoms of acute chest pressure associated with atrial fibrillation with a rapid ventricular response. She had a new left bundle branch block.  Procedural Details: The right wrist was prepped, draped, and anesthetized with 1% lidocaine. Using the modified Seldinger technique, a 5 French sheath was introduced into the right radial artery. 3 mg of verapamil was administered through the sheath, weight-based unfractionated heparin was administered intravenously. Standard Judkins catheters  were used for selective coronary angiography and left ventriculography. Catheter exchanges were performed over an exchange length guidewire. There were no immediate procedural complications. A TR  band was used for radial hemostasis at the completion of the procedure. The patient was transferred to the post catheterization recovery area for further monitoring.  Procedural Findings:  Hemodynamics:  AO 115/58 with a mean of 81 mmHg  LV 117/18 mmHg  Coronary angiography:  Coronary dominance: right  Left mainstem: There is diffuse narrowing of the left main to 30-40%.  Left anterior descending (LAD): The left anterior descending artery is moderately calcified throughout the proximal to mid vessel. There is diffuse disease throughout the proximal to mid segment. The proximal vessel there is a to 70% stenosis. In the mid vessel there is diffuse 70% disease with one focal area of 80%. The first diagonal is a moderate-sized vessel with an 80-90% stenosis proximally.  Left circumflex (LCx): The left circumflex gives rise to 2 marginal vessels before terminating in a third marginal branch. The first marginal vessel is very small in caliber with diffuse disease up to 90%. The second marginal branch is a large branch which has an 80-90% stenosis proximally. This is followed by diffuse 50% disease in the mid vessel and a 70-80% stenosis distally after the bifurcation.  Right coronary artery (RCA): The right coronary is a dominant vessel. It is mildly calcified proximally. There are diffuse irregularities in the vessel up to 20-30%.  Left ventriculography: Left ventricular systolic function is abnormal, LVEF is estimated at 40%, there is global hypokinesis, there is no significant mitral regurgitation  Final Conclusions:  1. Diffuse 2 vessel obstructive coronary artery disease  2. Moderate left ventricular dysfunction.  Recommendations: We will optimize her medical therapy. Continue amiodarone for treatment of her  atrial fibrillation. Given her high [place] score would recommend anticoagulation. Recommend ischemia/viability assessment as an outpatient. If this demonstrates significant ischemia/viability would recommend coronary bypass surgery.  Theron Arista [facility]  07/20/2012, 8:07 AM          Assessment / Plan:   Diffuse coronary occlusive disease with LV dysfunction History of previous breast cancer  node positive treated with chemotherapy of unknown type possibly Adriamycin, no radiation Poorly controlled DM Paroxysmal atrial fibrillationwith LBBB  The patient was seen in the office today her symptoms reviewed and findings discussed with. She will obtain her medical records from [place] in regard to her previous exposure to possible cardiac toxic medications. Since her stress test, symptoms, and degree of coronary occlusive disease are somewhat distant discongruent, as are the recommendations from the various cardiologist she is seen. I will have the cardiologist involved again review her films, and see her back in the office early next week.     Delight Ovens MD      [address] [place] [place] [address] Office [phone]   Beeper [phone]  09/08/2012 10:44 AM

## 2012-09-10 ENCOUNTER — Encounter: Payer: BC Managed Care – PPO | Admitting: Cardiothoracic Surgery

## 2012-09-11 ENCOUNTER — Other Ambulatory Visit: Payer: Self-pay | Admitting: Family Medicine

## 2012-09-21 ENCOUNTER — Telehealth: Payer: Self-pay | Admitting: *Deleted

## 2012-09-21 NOTE — Telephone Encounter (Signed)
Message copied by Tobin Chad on Mon Sep 21, 2012  8:53 AM ------      Message from: Thurmon Fair      Created: Sun Sep 20, 2012  6:53 PM       No serious carotid obstruction, no need for reevaluation ------

## 2012-09-21 NOTE — Telephone Encounter (Signed)
Spoke to patient . Result given. Voiced understanding 

## 2012-09-21 NOTE — Telephone Encounter (Signed)
Message copied by Tobin Chad on Mon Sep 21, 2012  8:56 AM ------      Message from: Thurmon Fair      Created: Sun Sep 20, 2012  6:53 PM       No serious carotid obstruction, no need for reevaluation ------

## 2012-09-22 ENCOUNTER — Encounter: Payer: Self-pay | Admitting: Cardiothoracic Surgery

## 2012-09-22 ENCOUNTER — Ambulatory Visit (INDEPENDENT_AMBULATORY_CARE_PROVIDER_SITE_OTHER): Payer: BC Managed Care – PPO | Admitting: Cardiothoracic Surgery

## 2012-09-22 VITALS — BP 128/73 | HR 72 | Resp 16 | Ht 64.0 in | Wt 159.0 lb

## 2012-09-22 DIAGNOSIS — I2510 Atherosclerotic heart disease of native coronary artery without angina pectoris: Secondary | ICD-10-CM

## 2012-09-22 DIAGNOSIS — I2589 Other forms of chronic ischemic heart disease: Secondary | ICD-10-CM

## 2012-09-22 DIAGNOSIS — I219 Acute myocardial infarction, unspecified: Secondary | ICD-10-CM

## 2012-09-22 DIAGNOSIS — I255 Ischemic cardiomyopathy: Secondary | ICD-10-CM

## 2012-09-22 NOTE — Patient Instructions (Signed)
Stop Xarelto Thursday 7/17 Stop Glucophage Sunday 7/20 Stop Cozaar Saturday 7/19         [address]       [place] [address]             [phone]       Coronary Artery Bypass Grafting  Care After  Refer to this sheet in the next few weeks. These instructions provide you with information on caring for yourself after your procedure. Your caregiver may also give you more specific instructions. Your treatment has been planned according to current medical practices, but problems sometimes occur. Call your caregiver if you have any problems or questions after your procedure.  Recovery from open heart surgery will be different for everyone. Some people feel well after 3 or 4 weeks, while for others it takes longer. After heart surgery, it may be normal to:  Not have an appetite, feel nauseated by the smell of food, or only want to eat a small amount.   Be constipated because of changes in your diet, activity, and medicines. Eat foods high in fiber. Add fresh fruits and vegetables to your diet. Stool softeners may be helpful.   Feel sad or unhappy. You may be frustrated or cranky. You may have good days and bad days. Do not give up. Talk to your caregiver if you do not feel better.   Feel weakness and fatigue. You many need physical therapy or cardiac rehabilitation to get your strength back.   Develop an irregular heartbeat called atrial fibrillation. Symptoms of atrial fibrillation are a fast, irregular heartbeat or feelings of fluttery heartbeats, shortness of breath, low blood pressure, and dizziness. If these symptoms develop, see your caregiver right away.  MEDICATION  Have a list of all the medicines you will be taking when you leave the hospital. For every medicine, know the following:   Name.   Exact dose.   Time of day to be taken.   How often it should be taken.   Why you are taking it.   Ask which medicines should or should not be taken  together. If you take more than one heart medicine, ask if it is okay to take them together. Some heart medicines should not be taken at the same time because they may lower your blood pressure too much.   Narcotic pain medicine can cause constipation. Eat fresh fruits and vegetables. Add fiber to your diet. Stool softener medicine may help relieve constipation.   Keep a copy of your medicines with you at all times.   Do not add or stop taking any medicine until you check with your caregiver.   Medicines can have side effects. Call your caregiver who prescribed the medicine if you:   Start throwing up, have diarrhea, or have stomach pain.   Feel dizzy or lightheaded when you stand up.   Feel your heart is skipping beats or is beating too fast or too slow.   Develop a rash.   Notice unusual bruising or bleeding.  HOME CARE INSTRUCTIONS  After heart surgery, it is important to learn how to take your pulse. Have your caregiver show you how to take your pulse.   Use your incentive spirometer. Ask your caregiver how long after surgery you need to use it.  Care of your chest incision  Tell your caregiver right away if you notice clicking in your chest (sternum).   Support your chest with a pillow or your arms when you take  deep breaths and cough.   Follow your caregiver's instructions about when you can bathe or swim.   Protect your incision from sunlight during the first year to keep the scar from getting dark.   Tell your caregiver if you notice:   Increased tenderness of your incision.   Increased redness or swelling around your incision.   Drainage or pus from your incision.  Care of your leg incision(s)  Avoid crossing your legs.   Avoid sitting for long periods of time. Change positions every half hour.   Elevate your leg(s) when you are sitting.   Check your leg(s) daily for swelling. Check the incisions for redness or drainage.   Diet is very important to heart  health.   Eat plenty of fresh fruits and vegetables. Meats should be lean cut. Avoid canned, processed, and fried foods.   Talk to a dietician. They can teach you how to make healthy food and drink choices.  Weight  Weigh yourself every day. This is important because it helps to know if you are retaining fluid that may make your heart and lungs work harder.   Use the same scale each time.   Weigh yourself every morning at the same time. You should do this after you go to the bathroom, but before you eat breakfast.   Your weight will be more accurate if you do not wear any clothes.   Record your weight.   Tell your caregiver if you have gained 2 pounds or more overnight.  Activity Stop any activity at once if you have chest pain, shortness of breath, irregular heartbeats, or dizziness. Get help right away if you have any of these symptoms.  Bathing.  Avoid soaking in a bath or hot tub until your incisions are healed.   Rest. You need a balance of rest and activity.   Exercise. Exercise per your caregiver's advice. You may need physical therapy or cardiac rehabilitation to help strengthen your muscles and build your endurance.   Climbing stairs. Unless your caregiver tells you not to climb stairs, go up stairs slowly and rest if you tire. Do not pull yourself up by the handrail.   Driving a car. Follow your caregiver's advice on when you may drive. You may ride as a passenger at any time. When traveling for long periods of time in a car, get out of the car and walk around for a few minutes every 2 hours.   Lifting. Avoid lifting, pushing, or pulling anything heavier than 10 pounds for 6 weeks after surgery or as told by your caregiver.   Returning to work. Check with your caregiver. People heal at different rates. Most people will be able to go back to work 6 to 12 weeks after surgery.   Sexual activity. You may resume sexual relations as told by your caregiver.  SEEK MEDICAL CARE  IF:  Any of your incisions are red, painful, or have any type of drainage coming from them.   You have an oral temperature above 101.5 F .   You have ankle or leg swelling.   You have pain in your legs.   You have weight gain of 2 or more pounds a day.   You feel dizzy or lightheaded when you stand up.  SEEK IMMEDIATE MEDICAL CARE IF:  You have angina or chest pain that goes to your jaw or arms. Call your local emergency services right away.   You have shortness of breath at rest or with  activity.   You have a fast or irregular heartbeat (arrhythmia).   There is a "clicking" in your sternum when you move.   You have numbness or weakness in your arms or legs.  MAKE SURE YOU:  Understand these instructions.   Will watch your condition.   Will get help right away if you are not doing well or get worse.

## 2012-09-22 NOTE — Progress Notes (Addendum)
[address]       [place] [address]             [phone]                    Joella Saefong [facility] Medical Record #[id number] Date of Birth: 11/10/1950  Referring: Thurmon Fair, MD Primary Care: Evette Georges, MD  Chief Complaint:    Chief Complaint  Patient presents with  . Follow-up    to further discuss CAD    History of Present Illness:      Mrs. Henkes has recently undergone extensive evaluation for coronary problems by 2 different cardiology practices. Mrs. Savary has diabetes mellitus type 2, hyperlipidemia and hypertension and had evaluation in [place] roughly 10 years ago, it is unclear if this was a myocardial infarction or arrhythmia. This was apparently treated with conservative means. She was recently admitted to [facility] with atrial fibrillation and rapid ventricular response. She has a new onset left bundle branch block and there was initial concern about ventricular tachycardia. She converted to normal sinus rhythm with treatment with amiodarone. There was no evidence of an acute ischemic injury by biochemical markers.  She subsequently undergone 3 different cardiac imaging studies. Her echocardiogram described a left ventricular ejection fraction of 35% with inferoseptal hypokinesia and moderate pulmonary hypertension with an estimated PA pressure of 59 mm Hg. The left atrium was described as mildly enlarged and there was mild left ventricular hypertrophy. She subsequently underwent cardiac catheterization by Dr. Peter Swaziland, maximizing her medical treatment and stress testing was recommended.  They showed only a very small area of reduced perfusion (mostly fixed, possibly mild ischemia) in the apex. The ejection fraction was estimated to be much better at 52%.  She has not had any clinical recurrence of atrial fibrillation. She is currently receiving treatment with amiodarone but at a lower dose (the initial loading dose caused  nausea). She has not had any bleeding problems. She has no history of stroke or tremors ischemic attack or other embolic events. She has NYHA functional class II. She only expresses symptoms of shortness of breath and very mild chest tightness when she hurries.  She has taken nitroglycerin sublingually since her hospital discharge.  Since last seen the patient has had at least one episode of classic anginal symptoms with relief after 3 nitroglycerin.    Current Activity/ Functional Status:  Patient is independent with mobility/ambulation, transfers, ADL's, IADL's.  Zubrod Score: At the time of surgery this patient's most appropriate activity status/level should be described as: []  Normal activity, no symptoms [x]  Symptoms, fully ambulatory []  Symptoms, in bed less than or equal to 50% of the time []  Symptoms, in bed greater than 50% of the time but less than 100% []  Bedridden []  Moribund   Past Medical History  Diagnosis Date  . Diabetes mellitus type II   . Hyperlipidemia   . Hypertension   . Allergic rhinitis   . Cataract   . CAD (coronary artery disease)     a. 2004: s/p MI in [place]. No PCI->Medical RX;  b. 07/2012 Cath: LM 30-40, LAD 70p, 70/67m, D1 80-90p, OM1 small 90p, OM2 large 80-90p, 1m, 70-80d, RCA 20-30 diff, EF 40%, glob HK.  . Ischemic cardiomyopathy     a. 07/2012 Echo: EF 35%, Sev inferoseptal HK, mildly dil LA, Peak PASP .  Marland Kitchen PAF (paroxysmal atrial fibrillation)     a. 07/2012: Amio and xarelto initiated.  Marland Kitchen  LBBB (left bundle branch block)     a. intermittent - present during rapid afib 07/2012.  . Breast cancer 2002    Patient reports left breast cancer diagnosis in 2002 treated with bilateral mastectomy positive lymph nodes with left axillary dissection followed by chemotherapy with Taxotere and Adriamycin  . Neuromuscular disorder     Patient reports chronic numbness in the right foot related to previous surgery on the right leg and "nerve damage"     Past Surgical History  Procedure Laterality Date  . Mastectomy  2002    bilateral  . Tubal ligation  1987  . Bladder surgery  2000  . Right leg surgery  2001    torn ligaments and tendons x4 surgeries  . Appendectomy  1974  . Cholecystectomy  1974  . Abdominal hysterectomy  2000  . Breast surgery      Bilateral mastectomy for left breast cancer unknown stage but patient reports positive nodes was treated with chemotherapy  . Abdominal hysterectomy      Family History  Problem Relation Age of Onset  . Kidney failure Mother   . Heart disease Mother     Died mid 56s complications of diabetes  . Arthritis Other   . Cancer Other     colon, ovarian  . Diabetes Other   . Hyperlipidemia Other   . Kidney failure Other   . Colon cancer Sister     History   Social History  . Marital Status: Married    Spouse Name: N/A    Number of Children: 3  . Years of Education: N/A   Occupational History  . Office work    Social History Main Topics  . Smoking status: Former Smoker -- 1.00 packs/day for 10 years    Types: Cigarettes    Quit date: 03/11/2012  . Smokeless tobacco: Never Used     Comment: Quit in January  . Alcohol Use: No  . Drug Use: No  . Sexually Active: Not on file   Other Topics Concern  . Not on file   Social History Narrative   Lives with husband and mother in law.      History  Smoking status  . Former Smoker -- 1.00 packs/day for 10 years  . Types: Cigarettes  . Quit date: 03/11/2012  Smokeless tobacco  . Never Used    Comment: Quit in January    History  Alcohol Use No     No Known Allergies  Current Outpatient Prescriptions  Medication Sig Dispense Refill  . amiodarone (PACERONE) 200 MG tablet Take 1 tablet (200 mg total) by mouth daily.  60 tablet  3  . aspirin 81 MG tablet Take 1 tablet (81 mg total) by mouth daily.  30 tablet    . atorvastatin (LIPITOR) 80 MG tablet Take 1 tablet (80 mg total) by mouth daily.  90 tablet  3  .  carvedilol (COREG) 25 MG tablet Take 1 tablet (25 mg total) by mouth 2 (two) times daily with a meal.  60 tablet  6  . isosorbide mononitrate (IMDUR) 60 MG 24 hr tablet Take 1.5 tablets (90 mg total) by mouth 2 (two) times daily.  100 tablet  2  . losartan (COZAAR) 50 MG tablet Take 50 mg by mouth 2 (two) times daily.      . metFORMIN (GLUCOPHAGE) 1000 MG tablet TAKE ONE TABLET BY MOUTH TWICE DAILY WITH MEALS  60 tablet  0  . nitroGLYCERIN (NITROSTAT) 0.4 MG SL tablet  Place 1 tablet (0.4 mg total) under the tongue every 5 (five) minutes as needed for chest pain.  25 tablet  3  . Rivaroxaban (XARELTO) 20 MG TABS Take 1 tablet (20 mg total) by mouth daily with supper.  30 tablet  6  . Blood Glucose Monitoring Suppl (ONE TOUCH ULTRA SYSTEM KIT) W/DEVICE KIT 1 kit by Does not apply route once.        Marland Kitchen glimepiride (AMARYL) 4 MG tablet TAKE 1 TABLET (4 MG TOTAL) BY MOUTH 2 (TWO) TIMES DAILY.  60 tablet  0  . Glucose Blood (ONETOUCH ULTRA BLUE VI) by In Vitro route daily.         No current facility-administered medications for this visit.       Review of Systems:     Cardiac Review of Systems: Y or N  Chest Pain Cove.Etienne    ]  Resting SOB [  y ] Exertional SOB  [  y]  Orthopnea Cove.Etienne  ]   Pedal Edema Cove.Etienne   ]    Palpitations [ y ] Syncope  [ n ]   Presyncope [ y  ]  General Review of Systems: [Y] = yes [  ]=no Constitional: recent weight change [  ]; anorexia [  ]; fatigue [ y ]; nausea [  ]; night sweats [  ]; fever [n  ]; or chills [ n ];                                                                                                                                          Dental: poor dentition[  ]; Last Dentist visit:   Eye : blurred vision [  ]; diplopia [   ]; vision changes [  ];  Amaurosis fugax[  ]; Resp: cough [ n ];  wheezing[n  ];  hemoptysis[ n ]; shortness of breath[y  ]; paroxysmal nocturnal dyspnea[ y ]; dyspnea on exertion[ y ]; or orthopnea[  ];  GI:  gallstones[  ], vomiting[  ];   dysphagia[  ]; melena[  ];  hematochezia [  ]; heartburn[  ];   Hx of  Colonoscopy[  ]; GU: kidney stones [  ]; hematuria[n  ];   dysuria [  ];  nocturia[  ];  history of     obstruction [  ]; urinary frequency [  ]             Skin: rash, swelling[  ];, hair loss[  ];  peripheral edema[  ];  or itching[  ]; Musculosketetal: myalgias[  ];  joint swelling[  ];  joint erythema[  ];  joint pain[  ];  back pain[  ];  Heme/Lymph: bruising[  ];  bleeding[  ];  anemia[  ];  Neuro: TIA[ n ];  headaches[n  ];  stroke[  ];  vertigo[  ];  seizures[  ];  paresthesias[  ];  difficulty walking[ chronic rt leg numbness ];  Psych:depression[  ]; anxiety[  ];  Endocrine: diabetes[y  ];  thyroid dysfunction[ n ];  Immunizations: Flu [ y ]; Pneumococcal[ y ];  Other:  Physical Exam: BP 128/73  Pulse 72  Resp 16  Ht 5\' 4"  (1.626 m)  Wt 159 lb (72.122 kg)  BMI 27.28 kg/m2  SpO2 98%  General appearance: alert, cooperative, appears older than stated age and no distress Neurologic: intact Heart: regular rate and rhythm, S1, S2 normal, no murmur, click, rub or gallop and normal apical impulse Lungs: clear to auscultation bilaterally/bilaterial breast implants Abdomen: soft, non-tender; bowel sounds normal; no masses,  no organomegaly Extremities: extremities normal, atraumatic, no cyanosis or edema and Homans sign is negative, no sign of DVT Patient has no cervical or supraclavicular adenopathy There is a faint right carotid bruit   Diagnostic Studies & Laboratory data:     Recent Radiology Findings:  07/27/2012: nuclear stress test: Impression  Exercise Capacity: Adenosine study with no exercise.  BP Response: Normal blood pressure response.  Clinical Symptoms: chest pressure  ECG Impression: No significant ST segment change suggestive of ischemia.  Comparison with Prior Nuclear Study: No images to compare  Overall Impression: It is known that this patient has an intermittent left bundle branch  block. Today the patient had sinus rhythm and left bundle branch block. The images show a small area of decreased uptake in the apical anterior segment and apical cap. There is question that there could be slight ischemia in this area. There are wall motion abnormalities that would go along with the patient's left bundle branch block. It is possible that all of the findings are related to the left bundle-branch block. I cannot absolutely rule out the possibility of slight ischemia in the apical anterior segment.  LV Ejection Fraction: 52%. LV Wall Motion: There is dyssynergy of the septum it extends to the apex.  Willa Rough, MD     Recent Lab Findings: Lab Results  Component Value Date   WBC 6.1 07/21/2012   HGB 13.0 07/21/2012   HCT 38.1 07/21/2012   PLT 192 07/21/2012   GLUCOSE 210* 07/21/2012   CHOL 144 07/18/2012   TRIG 530* 07/18/2012   HDL 39* 07/18/2012   LDLDIRECT 134.3 09/19/2009   LDLCALC UNABLE TO CALCULATE IF TRIGLYCERIDE OVER 400 mg/dL 4/40/1027   ALT 19 2/53/6644   AST 13 07/18/2012   NA 136 07/21/2012   K 4.4 07/21/2012   CL 99 07/21/2012   CREATININE 0.60 07/21/2012   BUN 10 07/21/2012   CO2 28 07/21/2012   TSH 2.447 07/18/2012   INR 1.11 07/19/2012   HGBA1C 9.2* 07/18/2012   Cardiac Cath: Cardiac Catheterization Procedure Note  Name: Akiah Bauch  MRN: [id number]  DOB: [birth year]  Procedure: Left Heart Cath, Selective Coronary Angiography, LV angiography  Indication: 62 year old and an and and and a female with history of coronary disease, diabetes, hyperlipidemia, and tobacco abuse presents with symptoms of acute chest pressure associated with atrial fibrillation with a rapid ventricular response. She had a new left bundle branch block.  Procedural Details: The right wrist was prepped, draped, and anesthetized with 1% lidocaine. Using the modified Seldinger technique, a 5 French sheath was introduced into the right radial artery. 3 mg of verapamil was administered through the  sheath, weight-based unfractionated heparin was administered intravenously. Standard Judkins catheters were used for selective coronary angiography and left ventriculography. Catheter exchanges were performed over an exchange  length guidewire. There were no immediate procedural complications. A TR band was used for radial hemostasis at the completion of the procedure. The patient was transferred to the post catheterization recovery area for further monitoring.  Procedural Findings:  Hemodynamics:  AO 115/58 with a mean of 81 mmHg  LV 117/18 mmHg  Coronary angiography:  Coronary dominance: right  Left mainstem: There is diffuse narrowing of the left main to 30-40%.  Left anterior descending (LAD): The left anterior descending artery is moderately calcified throughout the proximal to mid vessel. There is diffuse disease throughout the proximal to mid segment. The proximal vessel there is a to 70% stenosis. In the mid vessel there is diffuse 70% disease with one focal area of 80%. The first diagonal is a moderate-sized vessel with an 80-90% stenosis proximally.  Left circumflex (LCx): The left circumflex gives rise to 2 marginal vessels before terminating in a third marginal branch. The first marginal vessel is very small in caliber with diffuse disease up to 90%. The second marginal branch is a large branch which has an 80-90% stenosis proximally. This is followed by diffuse 50% disease in the mid vessel and a 70-80% stenosis distally after the bifurcation.  Right coronary artery (RCA): The right coronary is a dominant vessel. It is mildly calcified proximally. There are diffuse irregularities in the vessel up to 20-30%.  Left ventriculography: Left ventricular systolic function is abnormal, LVEF is estimated at 40%, there is global hypokinesis, there is no significant mitral regurgitation  Final Conclusions:  1. Diffuse 2 vessel obstructive coronary artery disease  2. Moderate left ventricular  dysfunction.  Recommendations: We will optimize her medical therapy. Continue amiodarone for treatment of her atrial fibrillation. Given her high [place] score would recommend anticoagulation. Recommend ischemia/viability assessment as an outpatient. If this demonstrates significant ischemia/viability would recommend coronary bypass surgery.  Theron Arista [facility]  07/20/2012, 8:07 AM          Assessment / Plan:   Diffuse coronary occlusive disease with LV dysfunction with ongoing angina History of previous breast cancer  node positive treated with  Adriamycin, no radiation, poss complicating factor in cardiomyopathy Poorly controlled DM Paroxysmal atrial fibrillationwith LBBB Bilateral Breast implants Chronic nerve damage to rt leg, will try to get vein from left leg  The patient was seen in the office today her symptoms reviewed and findings discussed with. She  obtained her medical records from [place] in regard to her previous exposure to possible cardiac toxic medications. Since she was last seen she is continued to have episodic angina. I've reviewed her films with Dr. Swaziland and Dr. Rennis Golden, in addition obtained the previous cath films from 2009 which shows a steady progression of diffuse three-vessel coronary artery disease. With her ongoing angina I recommended proceeding with coronary artery bypass grafting and MAZE . The risks and options are discussed with her in detail. She will stop her anticoagulation preoperatively and hold the Glucophage and Cozaar immediately preop. We tentatively plan to proceed with surgery July 21.   Delight Ovens MD      [address] [place] [place] [address] Office [phone]   Beeper [phone]  09/22/2012 6:00 PM

## 2012-09-23 ENCOUNTER — Encounter: Payer: Self-pay | Admitting: Cardiovascular Disease

## 2012-09-23 ENCOUNTER — Other Ambulatory Visit: Payer: Self-pay | Admitting: *Deleted

## 2012-09-23 ENCOUNTER — Telehealth: Payer: Self-pay | Admitting: Cardiovascular Disease

## 2012-09-23 ENCOUNTER — Ambulatory Visit (INDEPENDENT_AMBULATORY_CARE_PROVIDER_SITE_OTHER): Payer: BC Managed Care – PPO | Admitting: Cardiovascular Disease

## 2012-09-23 ENCOUNTER — Encounter (HOSPITAL_COMMUNITY): Payer: Self-pay | Admitting: Pharmacy Technician

## 2012-09-23 VITALS — BP 132/80 | HR 71 | Resp 16 | Ht 64.5 in | Wt 159.2 lb

## 2012-09-23 DIAGNOSIS — I2510 Atherosclerotic heart disease of native coronary artery without angina pectoris: Secondary | ICD-10-CM

## 2012-09-23 DIAGNOSIS — I255 Ischemic cardiomyopathy: Secondary | ICD-10-CM

## 2012-09-23 DIAGNOSIS — I4891 Unspecified atrial fibrillation: Secondary | ICD-10-CM

## 2012-09-23 DIAGNOSIS — I480 Paroxysmal atrial fibrillation: Secondary | ICD-10-CM

## 2012-09-23 DIAGNOSIS — I2589 Other forms of chronic ischemic heart disease: Secondary | ICD-10-CM

## 2012-09-23 DIAGNOSIS — R079 Chest pain, unspecified: Secondary | ICD-10-CM

## 2012-09-23 NOTE — Progress Notes (Signed)
Patient ID: [name], female   DOB: [birth year], 62 y.o.   MRN: [id number]     Reason for office visit CAD, PAF, LV dysfunction   [name] returns in followup, having just seen Dr. Tyrone Sage to discuss CABG surgery. Initially minimally symptomatic last weekend she developed some shortness of breath and chest discomfort. She has not had any palpitations to suggest recurrence of atrial fibrillation. After a discussion with Dr. Tyrone Sage she has decided to proceed with bypass surgery. Today she feels well. She continues to have mild exertional dyspnea NYHA class II.    No Known Allergies  Current Outpatient Prescriptions  Medication Sig Dispense Refill  . amiodarone (PACERONE) 200 MG tablet Take 1 tablet (200 mg total) by mouth daily.  60 tablet  3  . aspirin 81 MG tablet Take 1 tablet (81 mg total) by mouth daily.  30 tablet    . atorvastatin (LIPITOR) 80 MG tablet Take 1 tablet (80 mg total) by mouth daily.  90 tablet  3  . Blood Glucose Monitoring Suppl (ONE TOUCH ULTRA SYSTEM KIT) W/DEVICE KIT 1 kit by Does not apply route once.        . carvedilol (COREG) 25 MG tablet Take 1 tablet (25 mg total) by mouth 2 (two) times daily with a meal.  60 tablet  6  . Glucose Blood (ONETOUCH ULTRA BLUE VI) by In Vitro route daily.        . isosorbide mononitrate (IMDUR) 60 MG 24 hr tablet Take 60 mg by mouth 2 (two) times daily.      Marland Kitchen losartan (COZAAR) 50 MG tablet Take 50 mg by mouth 2 (two) times daily.      . nitroGLYCERIN (NITROSTAT) 0.4 MG SL tablet Place 1 tablet (0.4 mg total) under the tongue every 5 (five) minutes as needed for chest pain.  25 tablet  3  . Rivaroxaban (XARELTO) 20 MG TABS Take 1 tablet (20 mg total) by mouth daily with supper.  30 tablet  6  . glimepiride (AMARYL) 4 MG tablet Take 4 mg by mouth 2 (two) times daily.      . metFORMIN (GLUCOPHAGE) 1000 MG tablet Take 1,000 mg by mouth 2 (two) times daily with a meal.       No current facility-administered medications for  this visit.    Past Medical History  Diagnosis Date  . Diabetes mellitus type II   . Hyperlipidemia   . Hypertension   . Allergic rhinitis   . Cataract   . CAD (coronary artery disease)     a. 2004: s/p MI in [place]. No PCI->Medical RX;  b. 07/2012 Cath: LM 30-40, LAD 70p, 70/59m, D1 80-90p, OM1 small 90p, OM2 large 80-90p, 72m, 70-80d, RCA 20-30 diff, EF 40%, glob HK.  . Ischemic cardiomyopathy     a. 07/2012 Echo: EF 35%, Sev inferoseptal HK, mildly dil LA, Peak PASP .  Marland Kitchen PAF (paroxysmal atrial fibrillation)     a. 07/2012: Amio and xarelto initiated.  Marland Kitchen LBBB (left bundle branch block)     a. intermittent - present during rapid afib 07/2012.  . Breast cancer 2002    Patient reports left breast cancer diagnosis in 2002 treated with bilateral mastectomy positive lymph nodes with left axillary dissection followed by chemotherapy of unknown type  . Neuromuscular disorder     Patient reports chronic numbness in the right foot related to previous surgery on the right leg and "nerve damage"    Past Surgical History  Procedure  Laterality Date  . Mastectomy  2002    bilateral  . Tubal ligation  1987  . Bladder surgery  2000  . Right leg surgery  2001    torn ligaments and tendons x4 surgeries  . Appendectomy  1974  . Cholecystectomy  1974  . Abdominal hysterectomy  2000  . Breast surgery      Bilateral mastectomy for left breast cancer unknown stage but patient reports positive nodes was treated with chemotherapy  . Abdominal hysterectomy      Family History  Problem Relation Age of Onset  . Kidney failure Mother   . Heart disease Mother     Died mid 92s complications of diabetes  . Arthritis Other   . Cancer Other     colon, ovarian  . Diabetes Other   . Hyperlipidemia Other   . Kidney failure Other   . Colon cancer Sister     History   Social History  . Marital Status: Married    Spouse Name: N/A    Number of Children: 3  . Years of Education: N/A    Occupational History  . Office work    Social History Main Topics  . Smoking status: Former Smoker -- 1.00 packs/day for 10 years    Types: Cigarettes    Quit date: 03/11/2012  . Smokeless tobacco: Never Used     Comment: Quit in January  . Alcohol Use: No  . Drug Use: No  . Sexually Active: Not on file   Other Topics Concern  . Not on file   Social History Narrative   Lives with husband and mother in law.      Review of systems: The patient specifically denies any  orthopnea, paroxysmal nocturnal dyspnea, syncope, palpitations, focal neurological deficits, intermittent claudication, lower extremity edema, unexplained weight gain, cough, hemoptysis or wheezing.  The patient also denies abdominal pain, nausea, vomiting, dysphagia, diarrhea, constipation, polyuria, polydipsia, dysuria, hematuria, frequency, urgency, abnormal bleeding or bruising, fever, chills, unexpected weight changes, mood swings, change in skin or hair texture, change in voice quality, auditory or visual problems, allergic reactions or rashes, new musculoskeletal complaints other than usual "aches and pains".   PHYSICAL EXAM BP 132/80  Pulse 71  Resp 16  Ht 5' 4.5" (1.638 m)  Wt 159 lb 3.2 oz (72.213 kg)  BMI 26.91 kg/m2  General: Alert, oriented x3, no distress  Head: no evidence of trauma, PERRL, EOMI, no exophtalmos or lid lag, no myxedema, no xanthelasma; normal ears, nose and oropharynx  Neck: normal jugular venous pulsations and no hepatojugular reflux; brisk carotid pulses without delay but there is a fairly loud right carotid bruit  Chest: clear to auscultation, no signs of consolidation by percussion or palpation, normal fremitus, symmetrical and full respiratory excursions  Cardiovascular: normal position and quality of the apical impulse, regular rhythm, normal first set and endoscopy split second heart sound, grade 1-2/6 early peaking systolic ejection murmur, no rubs or gallops  Abdomen: no  tenderness or distention, no masses by palpation, no abnormal pulsatility or arterial bruits, normal bowel sounds, no hepatosplenomegaly  Extremities: no clubbing, cyanosis or edema; 2+ radial, ulnar and brachial pulses bilaterally; 2+ right femoral, posterior tibial and dorsalis pedis pulses; 2+ left femoral, posterior tibial and dorsalis pedis pulses; no subclavian or femoral bruits  Neurological: grossly nonfocal  Normal mood and affect   EKG: NSR, LBBB   Lipid Panel     Component Value Date/Time   CHOL 144 07/18/2012 0500  TRIG 530* 07/18/2012 0500   HDL 39* 07/18/2012 0500   CHOLHDL 3.7 07/18/2012 0500   VLDL UNABLE TO CALCULATE IF TRIGLYCERIDE OVER 400 mg/dL 06/17/8117 1478   LDLCALC UNABLE TO CALCULATE IF TRIGLYCERIDE OVER 400 mg/dL 2/95/6213 0865    BMET    Component Value Date/Time   NA 136 07/21/2012 0430   K 4.4 07/21/2012 0430   CL 99 07/21/2012 0430   CO2 28 07/21/2012 0430   GLUCOSE 210* 07/21/2012 0430   BUN 10 07/21/2012 0430   CREATININE 0.60 07/21/2012 0430   CALCIUM 9.9 07/21/2012 0430   GFRNONAA >90 07/21/2012 0430   GFRAA >90 07/21/2012 0430     ASSESSMENT AND PLAN CAD (coronary artery disease) I agree with Dr. Dennie Maizes assessment that should be best served by surgical revascularization in the setting of multivessel CAD, diabetes mellitus and depressed left ventricular ejection fraction. Currently she does not have severe congestive heart failure but she does have depressed left ventricular systolic function and left bundle branch block. I think it might be worthwhile implanting epicardial left ventricular lead at the time of bypass surgery in anticipation of potential future need for cardiac resynchronization therapy.  PAF (paroxysmal atrial fibrillation) Consider modified Maze procedure at the time of her bypass. We'll discuss with Dr. Tyrone Sage. Should continue on amiodarone throughout the entire perioperative period.  Ischemic cardiomyopathy Today appears  clinically euvolemic< NYHA class II.   Orders Placed This Encounter  Procedures  . EKG 12-Lead   Meds ordered this encounter  Medications  . isosorbide mononitrate (IMDUR) 60 MG 24 hr tablet    Sig: Take 60 mg by mouth 2 (two) times daily.    Junious Silk, MD, [facility] [phone] office [phone] pager

## 2012-09-23 NOTE — Patient Instructions (Addendum)
Your physician recommends that you schedule a follow-up appointment in: 5 weeks.  Continue current medications.

## 2012-09-23 NOTE — Telephone Encounter (Signed)
Please call-concerning test she is suppose to have!

## 2012-09-23 NOTE — Assessment & Plan Note (Signed)
Consider modified Maze procedure at the time of her bypass. We'll discuss with Dr. Tyrone Sage. Should continue on amiodarone throughout the entire perioperative period.

## 2012-09-23 NOTE — Assessment & Plan Note (Signed)
Today appears clinically euvolemic< NYHA class II.

## 2012-09-23 NOTE — Assessment & Plan Note (Signed)
I agree with Dr. Dennie Maizes assessment that should be best served by surgical revascularization in the setting of multivessel CAD, diabetes mellitus and depressed left ventricular ejection fraction. Currently she does not have severe congestive heart failure but she does have depressed left ventricular systolic function and left bundle branch block. I think it might be worthwhile implanting epicardial left ventricular lead at the time of bypass surgery in anticipation of potential future need for cardiac resynchronization therapy.

## 2012-09-25 ENCOUNTER — Other Ambulatory Visit (HOSPITAL_COMMUNITY): Payer: BC Managed Care – PPO

## 2012-09-25 ENCOUNTER — Ambulatory Visit (HOSPITAL_COMMUNITY)
Admission: RE | Admit: 2012-09-25 | Discharge: 2012-09-25 | Disposition: A | Discharge status: Home / Self Care | Payer: BC Managed Care – PPO | Source: Ambulatory Visit | Attending: Cardiothoracic Surgery | Admitting: Cardiothoracic Surgery

## 2012-09-25 ENCOUNTER — Encounter (HOSPITAL_COMMUNITY)
Admission: RE | Admit: 2012-09-25 | Discharge: 2012-09-25 | Disposition: A | Discharge status: Home / Self Care | Payer: BC Managed Care – PPO | Source: Ambulatory Visit | Attending: Cardiothoracic Surgery | Admitting: Cardiothoracic Surgery

## 2012-09-25 ENCOUNTER — Encounter (HOSPITAL_COMMUNITY): Payer: Self-pay

## 2012-09-25 VITALS — BP 152/76 | HR 74 | Temp 98.0°F | Resp 18 | Ht 64.5 in | Wt 159.4 lb

## 2012-09-25 DIAGNOSIS — E119 Type 2 diabetes mellitus without complications: Secondary | ICD-10-CM | POA: Insufficient documentation

## 2012-09-25 DIAGNOSIS — Z01818 Encounter for other preprocedural examination: Secondary | ICD-10-CM | POA: Insufficient documentation

## 2012-09-25 DIAGNOSIS — I10 Essential (primary) hypertension: Secondary | ICD-10-CM | POA: Insufficient documentation

## 2012-09-25 DIAGNOSIS — I2510 Atherosclerotic heart disease of native coronary artery without angina pectoris: Secondary | ICD-10-CM

## 2012-09-25 DIAGNOSIS — Z01812 Encounter for preprocedural laboratory examination: Secondary | ICD-10-CM | POA: Insufficient documentation

## 2012-09-25 LAB — URINE MICROSCOPIC-ADD ON

## 2012-09-25 LAB — CBC
HCT: 33.9 % — ABNORMAL LOW (ref 36.0–46.0)
Hemoglobin: 11.8 g/dL — ABNORMAL LOW (ref 12.0–15.0)
MCH: 31.2 pg (ref 26.0–34.0)
MCHC: 34.8 g/dL (ref 30.0–36.0)
MCV: 89.7 fL (ref 78.0–100.0)
Platelets: 230 10*3/uL (ref 150–400)
RBC: 3.78 MIL/uL — ABNORMAL LOW (ref 3.87–5.11)
RDW: 12.6 % (ref 11.5–15.5)
WBC: 7.7 10*3/uL (ref 4.0–10.5)

## 2012-09-25 LAB — COMPREHENSIVE METABOLIC PANEL
ALT: 22 U/L (ref 0–35)
AST: 14 U/L (ref 0–37)
Albumin: 3.7 g/dL (ref 3.5–5.2)
Alkaline Phosphatase: 60 U/L (ref 39–117)
BUN: 15 mg/dL (ref 6–23)
CO2: 23 mEq/L (ref 19–32)
Calcium: 9.5 mg/dL (ref 8.4–10.5)
Chloride: 100 mEq/L (ref 96–112)
Creatinine, Ser: 0.76 mg/dL (ref 0.50–1.10)
GFR calc Af Amer: >90 mL/min (ref >90)
GFR calc non Af Amer: 90 mL/min — ABNORMAL LOW (ref >90)
Glucose, Bld: 294 mg/dL — ABNORMAL HIGH (ref 70–99)
Potassium: 4.2 mEq/L (ref 3.5–5.1)
Sodium: 135 mEq/L (ref 135–145)
Total Bilirubin: 0.6 mg/dL (ref 0.3–1.2)
Total Protein: 7.1 g/dL (ref 6.0–8.3)

## 2012-09-25 LAB — URINALYSIS, ROUTINE W REFLEX MICROSCOPIC
Bilirubin Urine: NEGATIVE
Glucose, UA: >1000 mg/dL — AB
Hgb urine dipstick: NEGATIVE
Ketones, ur: NEGATIVE mg/dL
Leukocytes, UA: NEGATIVE
Nitrite: NEGATIVE
Protein, ur: NEGATIVE mg/dL
Specific Gravity, Urine: 1.013 (ref 1.005–1.030)
Urobilinogen, UA: 0.2 mg/dL (ref 0.0–1.0)
pH: 6.5 (ref 5.0–8.0)

## 2012-09-25 LAB — APTT: aPTT: 31 seconds (ref 24–37)

## 2012-09-25 LAB — BLOOD GAS, ARTERIAL
Acid-Base Excess: 0.9 mmol/L (ref 0.0–2.0)
Bicarbonate: 24.7 mEq/L — ABNORMAL HIGH (ref 20.0–24.0)
Drawn by: 344381
O2 Saturation: 97.7 %
Patient temperature: 98.6
TCO2: 25.8 mmol/L (ref 0–100)
pCO2 arterial: 37.2 mmHg (ref 35.0–45.0)
pH, Arterial: 7.437 (ref 7.350–7.450)
pO2, Arterial: 94.9 mmHg (ref 80.0–100.0)

## 2012-09-25 LAB — SURGICAL PCR SCREEN
MRSA, PCR: NEGATIVE
Staphylococcus aureus: NEGATIVE

## 2012-09-25 LAB — ABO/RH: ABO/RH(D): A POS

## 2012-09-25 LAB — PROTIME-INR
INR: 1.13 (ref 0.00–1.49)
Prothrombin Time: 14.3 seconds (ref 11.6–15.2)

## 2012-09-25 LAB — HEMOGLOBIN A1C
Hgb A1c MFr Bld: 9.1 % — ABNORMAL HIGH (ref <5.7)
Mean Plasma Glucose: 214 mg/dL — ABNORMAL HIGH (ref <117)

## 2012-09-25 MED ORDER — ALBUTEROL SULFATE (5 MG/ML) 0.5% IN NEBU
2.5000 mg | INHALATION_SOLUTION | Freq: Once | RESPIRATORY_TRACT | Status: AC
  Administered 2012-09-25: 2.5 mg via RESPIRATORY_TRACT

## 2012-09-25 NOTE — Pre-Procedure Instructions (Signed)
[name]  09/25/2012   Your procedure is scheduled on:  September 28, 2012 at 7:30 AM  Report to [facility] at 5:30 AM.  Call this number if you have problems the morning of surgery: [phone]   Remember: Stop ALL NSAIDS (Ibuprofen, Motrin, Advil, Naprosyn, Naproxen, Aleve)   Do not eat food or drink liquids after midnight.   Take these medicines the morning of surgery with A SIP OF WATER: carvedilol (COREG), amiodarone (PACERONE), isosorbide                        mononitrate (IMDUR)              Do not wear jewelry, make-up or nail polish.  Do not wear lotions, powders, or perfumes. You may wear deodorant.  Do not shave 48 hours prior to surgery.   Do not bring valuables to the hospital.  [facility] is not responsible for any belongings or valuables.  Contacts, dentures or bridgework may not be worn into surgery.  Leave suitcase in the car. After surgery it may be brought to your room.  For patients admitted to the hospital, checkout time is 11:00 AM the day of  discharge.    Special Instructions: Shower using CHG 2 nights before surgery and the night before surgery.  If you shower the day of surgery use CHG.  Use special wash - you have one bottle of CHG for all showers.  You should use approximately 1/3 of the bottle for each shower.   Please read over the following fact sheets that you were given: Pain Booklet, Coughing and Deep Breathing, Blood Transfusion Information, Open Heart Packet, MRSA Information and Surgical Site Infection Prevention

## 2012-09-25 NOTE — Progress Notes (Signed)
Pt glucose 294 and urine glucose >1000 , Dr Tyrone Sage office made aware.

## 2012-09-25 NOTE — Progress Notes (Addendum)
VASCULAR LAB PRELIMINARY  PRELIMINARY  PRELIMINARY  PRELIMINARY  Pre-op Cardiac Surgery  Carotid Findings:  Bilateral:  Less than 39% ICA stenosis.  Vertebral artery flow is antegrade.  Performed on 08-27-12 at [facility].    Upper Extremity Right Left  Brachial Pressures 147 triphasic triphasic  Radial Waveforms triphasic triphasic  Ulnar Waveforms triphasic triphasic  Palmar Arch (Allen's Test) WNL WNL   Findings:  Doppler waveforms remain normal with ulnar and radial compressions bilaterally.    Lower  Extremity Right Left  Dorsalis Pedis    Anterior Tibial 145 triphasic 167 triphasic  Posterior Tibial 164 monophasic 159 triphasic  Ankle/Brachial Indices >1.0 >1.0    Findings:  ABI is within normal limits bilaterally.   Morrison Mcbryar, RVT 09/25/2012, 3:51 PM

## 2012-09-27 MED ORDER — DOPAMINE-DEXTROSE 3.2-5 MG/ML-% IV SOLN
2.0000 ug/kg/min | INTRAVENOUS | Status: AC
  Administered 2012-09-28: 3 ug/kg/min via INTRAVENOUS
  Filled 2012-09-27: qty 250

## 2012-09-27 MED ORDER — SODIUM CHLORIDE 0.9 % IV SOLN
INTRAVENOUS | Status: AC
  Administered 2012-09-28: 7.8 [IU]/h via INTRAVENOUS
  Filled 2012-09-27: qty 1

## 2012-09-27 MED ORDER — DEXMEDETOMIDINE HCL IN NACL 400 MCG/100ML IV SOLN
0.1000 ug/kg/h | INTRAVENOUS | Status: AC
  Administered 2012-09-28: 0.2 ug/kg/h via INTRAVENOUS
  Filled 2012-09-27: qty 100

## 2012-09-27 MED ORDER — NITROGLYCERIN IN D5W 200-5 MCG/ML-% IV SOLN
2.0000 ug/min | INTRAVENOUS | Status: DC
  Filled 2012-09-27: qty 250

## 2012-09-27 MED ORDER — EPINEPHRINE HCL 1 MG/ML IJ SOLN
0.5000 ug/min | INTRAMUSCULAR | Status: DC
  Filled 2012-09-27: qty 4

## 2012-09-27 MED ORDER — DEXTROSE 5 % IV SOLN
750.0000 mg | INTRAVENOUS | Status: DC
  Filled 2012-09-27: qty 750

## 2012-09-27 MED ORDER — DEXTROSE 5 % IV SOLN
1.5000 g | INTRAVENOUS | Status: AC
  Administered 2012-09-28: 1.5 g via INTRAVENOUS
  Administered 2012-09-28: .75 g via INTRAVENOUS
  Filled 2012-09-27: qty 1.5

## 2012-09-27 MED ORDER — PHENYLEPHRINE HCL 10 MG/ML IJ SOLN
30.0000 ug/min | INTRAVENOUS | Status: AC
  Administered 2012-09-28: 15 ug/min via INTRAVENOUS
  Filled 2012-09-27: qty 2

## 2012-09-27 MED ORDER — VANCOMYCIN HCL 10 G IV SOLR
1250.0000 mg | INTRAVENOUS | Status: AC
  Administered 2012-09-28: 1250 mg via INTRAVENOUS
  Filled 2012-09-27: qty 1250

## 2012-09-27 MED ORDER — MAGNESIUM SULFATE 50 % IJ SOLN
40.0000 meq | INTRAMUSCULAR | Status: DC
  Filled 2012-09-27: qty 10

## 2012-09-27 MED ORDER — HEPARIN SODIUM (PORCINE) 1000 UNIT/ML IJ SOLN
INTRAMUSCULAR | Status: DC
  Filled 2012-09-27: qty 30

## 2012-09-27 MED ORDER — POTASSIUM CHLORIDE 2 MEQ/ML IV SOLN
80.0000 meq | INTRAVENOUS | Status: DC
  Filled 2012-09-27: qty 40

## 2012-09-27 MED ORDER — PLASMA-LYTE 148 IV SOLN
INTRAVENOUS | Status: AC
  Administered 2012-09-28: 10:00:00
  Filled 2012-09-27: qty 2.5

## 2012-09-27 MED ORDER — SODIUM CHLORIDE 0.9 % IV SOLN
INTRAVENOUS | Status: AC
  Administered 2012-09-28: 69.8 mL/h via INTRAVENOUS
  Filled 2012-09-27: qty 40

## 2012-09-28 ENCOUNTER — Inpatient Hospital Stay (HOSPITAL_COMMUNITY)
Admission: RE | Admit: 2012-09-28 | Discharge: 2012-10-03 | DRG: 108 | Disposition: A | Discharge status: Home / Self Care | Payer: BC Managed Care – PPO | Source: Ambulatory Visit | Attending: Cardiothoracic Surgery | Admitting: Cardiothoracic Surgery

## 2012-09-28 ENCOUNTER — Encounter (HOSPITAL_COMMUNITY)
Admission: RE | Disposition: A | Discharge status: Home / Self Care | Payer: Self-pay | Source: Ambulatory Visit | Attending: Cardiothoracic Surgery

## 2012-09-28 ENCOUNTER — Inpatient Hospital Stay (HOSPITAL_COMMUNITY): Payer: BC Managed Care – PPO | Admitting: Certified Registered Nurse Anesthetist

## 2012-09-28 ENCOUNTER — Encounter (HOSPITAL_COMMUNITY): Payer: Self-pay | Admitting: *Deleted

## 2012-09-28 ENCOUNTER — Inpatient Hospital Stay (HOSPITAL_COMMUNITY): Payer: BC Managed Care – PPO

## 2012-09-28 ENCOUNTER — Encounter (HOSPITAL_COMMUNITY): Payer: Self-pay | Admitting: Certified Registered Nurse Anesthetist

## 2012-09-28 DIAGNOSIS — Z794 Long term (current) use of insulin: Secondary | ICD-10-CM

## 2012-09-28 DIAGNOSIS — I255 Ischemic cardiomyopathy: Secondary | ICD-10-CM | POA: Diagnosis present

## 2012-09-28 DIAGNOSIS — Z7901 Long term (current) use of anticoagulants: Secondary | ICD-10-CM

## 2012-09-28 DIAGNOSIS — I959 Hypotension, unspecified: Secondary | ICD-10-CM | POA: Diagnosis not present

## 2012-09-28 DIAGNOSIS — Z853 Personal history of malignant neoplasm of breast: Secondary | ICD-10-CM

## 2012-09-28 DIAGNOSIS — I2589 Other forms of chronic ischemic heart disease: Secondary | ICD-10-CM | POA: Diagnosis present

## 2012-09-28 DIAGNOSIS — I252 Old myocardial infarction: Secondary | ICD-10-CM

## 2012-09-28 DIAGNOSIS — I2510 Atherosclerotic heart disease of native coronary artery without angina pectoris: Principal | ICD-10-CM | POA: Diagnosis present

## 2012-09-28 DIAGNOSIS — I519 Heart disease, unspecified: Secondary | ICD-10-CM | POA: Diagnosis present

## 2012-09-28 DIAGNOSIS — I4891 Unspecified atrial fibrillation: Secondary | ICD-10-CM

## 2012-09-28 DIAGNOSIS — Z951 Presence of aortocoronary bypass graft: Secondary | ICD-10-CM

## 2012-09-28 DIAGNOSIS — Z79899 Other long term (current) drug therapy: Secondary | ICD-10-CM

## 2012-09-28 DIAGNOSIS — I447 Left bundle-branch block, unspecified: Secondary | ICD-10-CM | POA: Diagnosis present

## 2012-09-28 DIAGNOSIS — Z87891 Personal history of nicotine dependence: Secondary | ICD-10-CM

## 2012-09-28 DIAGNOSIS — E785 Hyperlipidemia, unspecified: Secondary | ICD-10-CM | POA: Diagnosis present

## 2012-09-28 DIAGNOSIS — IMO0001 Reserved for inherently not codable concepts without codable children: Secondary | ICD-10-CM | POA: Diagnosis not present

## 2012-09-28 DIAGNOSIS — I200 Unstable angina: Secondary | ICD-10-CM

## 2012-09-28 DIAGNOSIS — I10 Essential (primary) hypertension: Secondary | ICD-10-CM | POA: Diagnosis present

## 2012-09-28 DIAGNOSIS — E8779 Other fluid overload: Secondary | ICD-10-CM | POA: Diagnosis not present

## 2012-09-28 DIAGNOSIS — I480 Paroxysmal atrial fibrillation: Secondary | ICD-10-CM

## 2012-09-28 DIAGNOSIS — Z9221 Personal history of antineoplastic chemotherapy: Secondary | ICD-10-CM

## 2012-09-28 HISTORY — PX: EPICARDIAL PACING LEAD PLACEMENT: SHX6274

## 2012-09-28 HISTORY — PX: MAZE: SHX5063

## 2012-09-28 HISTORY — PX: INTRAOPERATIVE TRANSESOPHAGEAL ECHOCARDIOGRAM: SHX5062

## 2012-09-28 HISTORY — PX: CORONARY ARTERY BYPASS GRAFT: SHX141

## 2012-09-28 LAB — POCT I-STAT 3, ART BLOOD GAS (G3+)
Acid-base deficit: 3 mmol/L — ABNORMAL HIGH (ref 0.0–2.0)
Acid-base deficit: 1 mmol/L (ref 0.0–2.0)
Acid-base deficit: 2 mmol/L (ref 0.0–2.0)
Acid-base deficit: 2 mmol/L (ref 0.0–2.0)
Acid-base deficit: 2 mmol/L (ref 0.0–2.0)
Acid-base deficit: 2 mmol/L (ref 0.0–2.0)
Bicarbonate: 23.8 mEq/L (ref 20.0–24.0)
Bicarbonate: 24.5 mEq/L — ABNORMAL HIGH (ref 20.0–24.0)
Bicarbonate: 25.2 mEq/L — ABNORMAL HIGH (ref 20.0–24.0)
Bicarbonate: 22 mEq/L (ref 20.0–24.0)
Bicarbonate: 23.4 mEq/L (ref 20.0–24.0)
Bicarbonate: 23.7 mEq/L (ref 20.0–24.0)
O2 Saturation: 100 %
O2 Saturation: 100 %
O2 Saturation: 91 %
O2 Saturation: 97 %
O2 Saturation: 96 %
O2 Saturation: 98 %
Patient temperature: 36.4
Patient temperature: 36.6
Patient temperature: 37.1
Patient temperature: 37.3
TCO2: 25 mmol/L (ref 0–100)
TCO2: 26 mmol/L (ref 0–100)
TCO2: 27 mmol/L (ref 0–100)
TCO2: 23 mmol/L (ref 0–100)
TCO2: 25 mmol/L (ref 0–100)
TCO2: 25 mmol/L (ref 0–100)
pCO2 arterial: 47.9 mmHg — ABNORMAL HIGH (ref 35.0–45.0)
pCO2 arterial: 44.5 mmHg (ref 35.0–45.0)
pCO2 arterial: 50.8 mmHg — ABNORMAL HIGH (ref 35.0–45.0)
pCO2 arterial: 34.2 mmHg — ABNORMAL LOW (ref 35.0–45.0)
pCO2 arterial: 43.4 mmHg (ref 35.0–45.0)
pCO2 arterial: 43.1 mmHg (ref 35.0–45.0)
pH, Arterial: 7.303 — ABNORMAL LOW (ref 7.350–7.450)
pH, Arterial: 7.349 — ABNORMAL LOW (ref 7.350–7.450)
pH, Arterial: 7.301 — ABNORMAL LOW (ref 7.350–7.450)
pH, Arterial: 7.415 (ref 7.350–7.450)
pH, Arterial: 7.341 — ABNORMAL LOW (ref 7.350–7.450)
pH, Arterial: 7.351 (ref 7.350–7.450)
pO2, Arterial: 338 mmHg — ABNORMAL HIGH (ref 80.0–100.0)
pO2, Arterial: 227 mmHg — ABNORMAL HIGH (ref 80.0–100.0)
pO2, Arterial: 65 mmHg — ABNORMAL LOW (ref 80.0–100.0)
pO2, Arterial: 86 mmHg (ref 80.0–100.0)
pO2, Arterial: 89 mmHg (ref 80.0–100.0)
pO2, Arterial: 106 mmHg — ABNORMAL HIGH (ref 80.0–100.0)

## 2012-09-28 LAB — CREATININE, SERUM
Creatinine, Ser: 0.42 mg/dL — ABNORMAL LOW (ref 0.50–1.10)
GFR calc Af Amer: >90 mL/min (ref >90)
GFR calc non Af Amer: >90 mL/min (ref >90)

## 2012-09-28 LAB — POCT I-STAT 4, (NA,K, GLUC, HGB,HCT)
Glucose, Bld: 314 mg/dL — ABNORMAL HIGH (ref 70–99)
Glucose, Bld: 231 mg/dL — ABNORMAL HIGH (ref 70–99)
Glucose, Bld: 248 mg/dL — ABNORMAL HIGH (ref 70–99)
Glucose, Bld: 211 mg/dL — ABNORMAL HIGH (ref 70–99)
Glucose, Bld: 227 mg/dL — ABNORMAL HIGH (ref 70–99)
Glucose, Bld: 238 mg/dL — ABNORMAL HIGH (ref 70–99)
Glucose, Bld: 183 mg/dL — ABNORMAL HIGH (ref 70–99)
HCT: 30 % — ABNORMAL LOW (ref 36.0–46.0)
HCT: 25 % — ABNORMAL LOW (ref 36.0–46.0)
HCT: 26 % — ABNORMAL LOW (ref 36.0–46.0)
HCT: 28 % — ABNORMAL LOW (ref 36.0–46.0)
HCT: 28 % — ABNORMAL LOW (ref 36.0–46.0)
HCT: 32 % — ABNORMAL LOW (ref 36.0–46.0)
HCT: 36 % (ref 36.0–46.0)
Hemoglobin: 10.2 g/dL — ABNORMAL LOW (ref 12.0–15.0)
Hemoglobin: 8.5 g/dL — ABNORMAL LOW (ref 12.0–15.0)
Hemoglobin: 8.8 g/dL — ABNORMAL LOW (ref 12.0–15.0)
Hemoglobin: 9.5 g/dL — ABNORMAL LOW (ref 12.0–15.0)
Hemoglobin: 9.5 g/dL — ABNORMAL LOW (ref 12.0–15.0)
Hemoglobin: 10.9 g/dL — ABNORMAL LOW (ref 12.0–15.0)
Hemoglobin: 12.2 g/dL (ref 12.0–15.0)
Potassium: 3.9 mEq/L (ref 3.5–5.1)
Potassium: 3.3 mEq/L — ABNORMAL LOW (ref 3.5–5.1)
Potassium: 3.9 mEq/L (ref 3.5–5.1)
Potassium: 3.7 mEq/L (ref 3.5–5.1)
Potassium: 3.9 mEq/L (ref 3.5–5.1)
Potassium: 3.3 mEq/L — ABNORMAL LOW (ref 3.5–5.1)
Potassium: 3 mEq/L — ABNORMAL LOW (ref 3.5–5.1)
Sodium: 139 mEq/L (ref 135–145)
Sodium: 134 mEq/L — ABNORMAL LOW (ref 135–145)
Sodium: 140 mEq/L (ref 135–145)
Sodium: 139 mEq/L (ref 135–145)
Sodium: 140 mEq/L (ref 135–145)
Sodium: 140 mEq/L (ref 135–145)
Sodium: 142 mEq/L (ref 135–145)

## 2012-09-28 LAB — CBC
HCT: 35.7 % — ABNORMAL LOW (ref 36.0–46.0)
HCT: 34.9 % — ABNORMAL LOW (ref 36.0–46.0)
Hemoglobin: 12.3 g/dL (ref 12.0–15.0)
Hemoglobin: 12.3 g/dL (ref 12.0–15.0)
MCH: 31.1 pg (ref 26.0–34.0)
MCHC: 34.5 g/dL (ref 30.0–36.0)
MCHC: 35.2 g/dL (ref 30.0–36.0)
MCV: 88.1 fL (ref 78.0–100.0)
Platelets: 211 10*3/uL (ref 150–400)
RBC: 4.02 MIL/uL (ref 3.87–5.11)
RBC: 3.96 MIL/uL (ref 3.87–5.11)
RDW: 12.8 % (ref 11.5–15.5)
WBC: 12.7 10*3/uL — ABNORMAL HIGH (ref 4.0–10.5)
WBC: 15.8 10*3/uL — ABNORMAL HIGH (ref 4.0–10.5)

## 2012-09-28 LAB — POCT I-STAT, CHEM 8
BUN: 5 mg/dL — ABNORMAL LOW (ref 6–23)
Calcium, Ion: 1.13 mmol/L (ref 1.13–1.30)
Chloride: 103 mEq/L (ref 96–112)
Creatinine, Ser: 0.6 mg/dL (ref 0.50–1.10)
Glucose, Bld: 128 mg/dL — ABNORMAL HIGH (ref 70–99)
HCT: 36 % (ref 36.0–46.0)
Hemoglobin: 12.2 g/dL (ref 12.0–15.0)
Potassium: 3.6 mEq/L (ref 3.5–5.1)
Sodium: 140 mEq/L (ref 135–145)
TCO2: 23 mmol/L (ref 0–100)

## 2012-09-28 LAB — PROTIME-INR
INR: 1.55 — ABNORMAL HIGH (ref 0.00–1.49)
Prothrombin Time: 18.2 seconds — ABNORMAL HIGH (ref 11.6–15.2)

## 2012-09-28 LAB — GLUCOSE, CAPILLARY
Glucose-Capillary: 279 mg/dL — ABNORMAL HIGH (ref 70–99)
Glucose-Capillary: 131 mg/dL — ABNORMAL HIGH (ref 70–99)
Glucose-Capillary: 111 mg/dL — ABNORMAL HIGH (ref 70–99)
Glucose-Capillary: 99 mg/dL (ref 70–99)
Glucose-Capillary: 118 mg/dL — ABNORMAL HIGH (ref 70–99)

## 2012-09-28 LAB — MAGNESIUM: Magnesium: 2.6 mg/dL — ABNORMAL HIGH (ref 1.5–2.5)

## 2012-09-28 LAB — HEMOGLOBIN AND HEMATOCRIT, BLOOD
HCT: 27 % — ABNORMAL LOW (ref 36.0–46.0)
Hemoglobin: 9.6 g/dL — ABNORMAL LOW (ref 12.0–15.0)

## 2012-09-28 LAB — APTT: aPTT: 32 seconds (ref 24–37)

## 2012-09-28 LAB — PLATELET COUNT: Platelets: 163 10*3/uL (ref 150–400)

## 2012-09-28 SURGERY — CORONARY ARTERY BYPASS GRAFTING (CABG)
Anesthesia: General | Site: Chest | Wound class: Clean

## 2012-09-28 MED ORDER — VANCOMYCIN HCL IN DEXTROSE 1-5 GM/200ML-% IV SOLN
1000.0000 mg | Freq: Once | INTRAVENOUS | Status: AC
  Administered 2012-09-28: 1000 mg via INTRAVENOUS
  Filled 2012-09-28: qty 200

## 2012-09-28 MED ORDER — LACTATED RINGERS IV SOLN: 500.0000 mL | Freq: Once | INTRAVENOUS | Status: AC | PRN

## 2012-09-28 MED ORDER — ACETAMINOPHEN 500 MG PO TABS
1000.0000 mg | ORAL_TABLET | Freq: Four times a day (QID) | ORAL | Status: DC
  Administered 2012-09-29 – 2012-09-30 (×3): 1000 mg via ORAL
  Filled 2012-09-28 (×9): qty 2

## 2012-09-28 MED ORDER — HEMOSTATIC AGENTS (NO CHARGE) OPTIME
TOPICAL | Status: DC | PRN
  Administered 2012-09-28: 1 via TOPICAL

## 2012-09-28 MED ORDER — DOPAMINE-DEXTROSE 3.2-5 MG/ML-% IV SOLN: 3.0000 ug/kg/min | INTRAVENOUS | Status: DC

## 2012-09-28 MED ORDER — OXYCODONE HCL 5 MG PO TABS
5.0000 mg | ORAL_TABLET | ORAL | Status: DC | PRN
  Administered 2012-09-29: 5 mg via ORAL
  Administered 2012-09-29: 10 mg via ORAL
  Filled 2012-09-28: qty 1
  Filled 2012-09-28: qty 2

## 2012-09-28 MED ORDER — SODIUM CHLORIDE 0.9 % IV SOLN: INTRAVENOUS | Status: DC

## 2012-09-28 MED ORDER — ACETAMINOPHEN 10 MG/ML IV SOLN
1000.0000 mg | Freq: Once | INTRAVENOUS | Status: AC
  Administered 2012-09-28: 1000 mg via INTRAVENOUS
  Filled 2012-09-28: qty 100

## 2012-09-28 MED ORDER — ATORVASTATIN CALCIUM 80 MG PO TABS
80.0000 mg | ORAL_TABLET | Freq: Every day | ORAL | Status: DC
  Administered 2012-09-28 – 2012-10-02 (×5): 80 mg via ORAL
  Filled 2012-09-28 (×6): qty 1

## 2012-09-28 MED ORDER — PROTAMINE SULFATE 10 MG/ML IV SOLN
INTRAVENOUS | Status: DC | PRN
  Administered 2012-09-28: 50 mg via INTRAVENOUS
  Administered 2012-09-28: 30 mg via INTRAVENOUS
  Administered 2012-09-28: 20 mg via INTRAVENOUS
  Administered 2012-09-28 (×4): 50 mg via INTRAVENOUS

## 2012-09-28 MED ORDER — SODIUM CHLORIDE 0.9 % IV SOLN
INTRAVENOUS | Status: DC
  Filled 2012-09-28: qty 1

## 2012-09-28 MED ORDER — SODIUM CHLORIDE 0.45 % IV SOLN: INTRAVENOUS | Status: DC

## 2012-09-28 MED ORDER — SODIUM CHLORIDE 0.9 % IJ SOLN
OROMUCOSAL | Status: DC | PRN
  Administered 2012-09-28 (×3): via TOPICAL

## 2012-09-28 MED ORDER — ACETAMINOPHEN 160 MG/5ML PO SOLN: 975.0000 mg | Freq: Four times a day (QID) | ORAL | Status: DC

## 2012-09-28 MED ORDER — SODIUM CHLORIDE 0.9 % IV SOLN: 250.0000 mL | INTRAVENOUS | Status: DC

## 2012-09-28 MED ORDER — HEPARIN SODIUM (PORCINE) 1000 UNIT/ML IJ SOLN
INTRAMUSCULAR | Status: DC | PRN
  Administered 2012-09-28: 5000 [IU] via INTRAVENOUS
  Administered 2012-09-28: 25000 [IU] via INTRAVENOUS

## 2012-09-28 MED ORDER — MIDAZOLAM HCL 2 MG/2ML IJ SOLN: 2.0000 mg | INTRAMUSCULAR | Status: DC | PRN

## 2012-09-28 MED ORDER — SODIUM CHLORIDE 0.9 % IV SOLN
5.0000 g | INTRAVENOUS | Status: AC
  Filled 2012-09-28: qty 20

## 2012-09-28 MED ORDER — LACTATED RINGERS IV SOLN
INTRAVENOUS | Status: DC | PRN
  Administered 2012-09-28: 07:00:00 via INTRAVENOUS

## 2012-09-28 MED ORDER — MORPHINE SULFATE 2 MG/ML IJ SOLN
2.0000 mg | INTRAMUSCULAR | Status: DC | PRN
  Administered 2012-09-28 – 2012-09-29 (×2): 2 mg via INTRAVENOUS
  Administered 2012-09-29: 4 mg via INTRAVENOUS
  Administered 2012-09-29: 2 mg via INTRAVENOUS
  Filled 2012-09-28 (×4): qty 1

## 2012-09-28 MED ORDER — ONDANSETRON HCL 4 MG/2ML IJ SOLN
4.0000 mg | Freq: Four times a day (QID) | INTRAMUSCULAR | Status: DC | PRN
  Administered 2012-09-28 – 2012-09-29 (×3): 4 mg via INTRAVENOUS
  Filled 2012-09-28 (×3): qty 2

## 2012-09-28 MED ORDER — LACTATED RINGERS IV SOLN
INTRAVENOUS | Status: DC | PRN
  Administered 2012-09-28 (×2): via INTRAVENOUS

## 2012-09-28 MED ORDER — PHENYLEPHRINE HCL 10 MG/ML IJ SOLN
0.0000 ug/min | INTRAVENOUS | Status: DC
  Filled 2012-09-28 (×2): qty 2

## 2012-09-28 MED ORDER — VECURONIUM BROMIDE 10 MG IV SOLR
INTRAVENOUS | Status: DC | PRN
  Administered 2012-09-28 (×4): 5 mg via INTRAVENOUS

## 2012-09-28 MED ORDER — PANTOPRAZOLE SODIUM 40 MG PO TBEC: 40.0000 mg | DELAYED_RELEASE_TABLET | Freq: Every day | ORAL | Status: DC

## 2012-09-28 MED ORDER — MORPHINE SULFATE 2 MG/ML IJ SOLN
1.0000 mg | INTRAMUSCULAR | Status: AC | PRN
  Administered 2012-09-28: 1 mg via INTRAVENOUS

## 2012-09-28 MED ORDER — DEXTROSE 5 % IV SOLN
1.5000 g | Freq: Two times a day (BID) | INTRAVENOUS | Status: DC
  Administered 2012-09-29 – 2012-09-30 (×3): 1.5 g via INTRAVENOUS
  Filled 2012-09-28 (×4): qty 1.5

## 2012-09-28 MED ORDER — FENTANYL CITRATE 0.05 MG/ML IJ SOLN
INTRAMUSCULAR | Status: DC | PRN
  Administered 2012-09-28 (×2): 250 ug via INTRAVENOUS
  Administered 2012-09-28: 100 ug via INTRAVENOUS
  Administered 2012-09-28: 250 ug via INTRAVENOUS
  Administered 2012-09-28: 100 ug via INTRAVENOUS
  Administered 2012-09-28: 250 ug via INTRAVENOUS
  Administered 2012-09-28: 50 ug via INTRAVENOUS

## 2012-09-28 MED ORDER — DEXMEDETOMIDINE HCL IN NACL 200 MCG/50ML IV SOLN: 0.1000 ug/kg/h | INTRAVENOUS | Status: DC

## 2012-09-28 MED ORDER — LACTATED RINGERS IV SOLN
INTRAVENOUS | Status: DC
  Administered 2012-09-29: 09:00:00 via INTRAVENOUS

## 2012-09-28 MED ORDER — POTASSIUM CHLORIDE 10 MEQ/50ML IV SOLN
10.0000 meq | INTRAVENOUS | Status: AC
  Administered 2012-09-28 – 2012-09-29 (×3): 10 meq via INTRAVENOUS
  Filled 2012-09-28: qty 150

## 2012-09-28 MED ORDER — METOPROLOL TARTRATE 12.5 MG HALF TABLET
12.5000 mg | ORAL_TABLET | Freq: Two times a day (BID) | ORAL | Status: DC
  Administered 2012-09-28 – 2012-09-30 (×3): 12.5 mg via ORAL
  Filled 2012-09-28 (×5): qty 1

## 2012-09-28 MED ORDER — ASPIRIN 81 MG PO CHEW: 324.0000 mg | CHEWABLE_TABLET | Freq: Every day | ORAL | Status: DC

## 2012-09-28 MED ORDER — MAGNESIUM SULFATE 40 MG/ML IJ SOLN
4.0000 g | Freq: Once | INTRAMUSCULAR | Status: AC
  Administered 2012-09-28: 4 g via INTRAVENOUS
  Filled 2012-09-28: qty 100

## 2012-09-28 MED ORDER — ALBUMIN HUMAN 5 % IV SOLN: 250.0000 mL | INTRAVENOUS | Status: AC | PRN

## 2012-09-28 MED ORDER — LIDOCAINE HCL (CARDIAC) 20 MG/ML IV SOLN
INTRAVENOUS | Status: DC | PRN
  Administered 2012-09-28 (×2): 50 mg via INTRAVENOUS

## 2012-09-28 MED ORDER — MILRINONE IN DEXTROSE 20 MG/100ML IV SOLN
INTRAVENOUS | Status: DC | PRN
  Administered 2012-09-28: .3 ug/kg/min via INTRAVENOUS

## 2012-09-28 MED ORDER — FAMOTIDINE IN NACL 20-0.9 MG/50ML-% IV SOLN
20.0000 mg | Freq: Two times a day (BID) | INTRAVENOUS | Status: AC
  Administered 2012-09-28: 20 mg via INTRAVENOUS

## 2012-09-28 MED ORDER — 0.9 % SODIUM CHLORIDE (POUR BTL) OPTIME
TOPICAL | Status: DC | PRN
  Administered 2012-09-28: 6000 mL

## 2012-09-28 MED ORDER — BISACODYL 5 MG PO TBEC
10.0000 mg | DELAYED_RELEASE_TABLET | Freq: Every day | ORAL | Status: DC
  Administered 2012-09-29: 10 mg via ORAL
  Filled 2012-09-28: qty 2

## 2012-09-28 MED ORDER — MIDAZOLAM HCL 5 MG/5ML IJ SOLN
INTRAMUSCULAR | Status: DC | PRN
  Administered 2012-09-28 (×2): 2 mg via INTRAVENOUS
  Administered 2012-09-28 (×3): 3 mg via INTRAVENOUS

## 2012-09-28 MED ORDER — LACTATED RINGERS IV SOLN
INTRAVENOUS | Status: DC | PRN
  Administered 2012-09-28 (×3): via INTRAVENOUS

## 2012-09-28 MED ORDER — SODIUM CHLORIDE 0.9 % IV SOLN
INTRAVENOUS | Status: DC | PRN
  Administered 2012-09-28 (×2): via INTRAVENOUS

## 2012-09-28 MED ORDER — CHLORHEXIDINE GLUCONATE 4 % EX LIQD: 30.0000 mL | CUTANEOUS | Status: DC

## 2012-09-28 MED ORDER — MILRINONE IN DEXTROSE 20 MG/100ML IV SOLN
0.3750 ug/kg/min | INTRAVENOUS | Status: DC
  Filled 2012-09-28: qty 100

## 2012-09-28 MED ORDER — NITROGLYCERIN IN D5W 200-5 MCG/ML-% IV SOLN
0.0000 ug/min | INTRAVENOUS | Status: DC
  Administered 2012-09-29: 80 ug/min via INTRAVENOUS
  Filled 2012-09-28: qty 250

## 2012-09-28 MED ORDER — POTASSIUM CHLORIDE 10 MEQ/50ML IV SOLN
10.0000 meq | INTRAVENOUS | Status: AC
  Administered 2012-09-28 (×3): 10 meq via INTRAVENOUS

## 2012-09-28 MED ORDER — ROCURONIUM BROMIDE 100 MG/10ML IV SOLN
INTRAVENOUS | Status: DC | PRN
  Administered 2012-09-28 (×2): 50 mg via INTRAVENOUS

## 2012-09-28 MED ORDER — METOCLOPRAMIDE HCL 5 MG/ML IJ SOLN
10.0000 mg | Freq: Four times a day (QID) | INTRAMUSCULAR | Status: AC
  Administered 2012-09-28 – 2012-09-29 (×4): 10 mg via INTRAVENOUS
  Filled 2012-09-28 (×4): qty 2

## 2012-09-28 MED ORDER — DOCUSATE SODIUM 100 MG PO CAPS
200.0000 mg | ORAL_CAPSULE | Freq: Every day | ORAL | Status: DC
  Administered 2012-09-29: 200 mg via ORAL
  Filled 2012-09-28: qty 2

## 2012-09-28 MED ORDER — INSULIN REGULAR BOLUS VIA INFUSION
0.0000 [IU] | Freq: Three times a day (TID) | INTRAVENOUS | Status: DC
  Filled 2012-09-28: qty 10

## 2012-09-28 MED ORDER — METOPROLOL TARTRATE 1 MG/ML IV SOLN
2.5000 mg | INTRAVENOUS | Status: DC | PRN
  Administered 2012-09-28: 2.5 mg via INTRAVENOUS
  Administered 2012-09-29 (×2): 5 mg via INTRAVENOUS
  Filled 2012-09-28: qty 5

## 2012-09-28 MED ORDER — SODIUM CHLORIDE 0.9 % IJ SOLN: 3.0000 mL | Freq: Two times a day (BID) | INTRAMUSCULAR | Status: DC

## 2012-09-28 MED ORDER — METOPROLOL TARTRATE 12.5 MG HALF TABLET: 12.5000 mg | ORAL_TABLET | Freq: Once | ORAL | Status: DC

## 2012-09-28 MED ORDER — ASPIRIN EC 325 MG PO TBEC
325.0000 mg | DELAYED_RELEASE_TABLET | Freq: Every day | ORAL | Status: DC
  Administered 2012-09-29: 325 mg via ORAL
  Filled 2012-09-28 (×3): qty 1

## 2012-09-28 MED ORDER — MILRINONE IN DEXTROSE 20 MG/100ML IV SOLN
0.3000 ug/kg/min | INTRAVENOUS | Status: DC
  Administered 2012-09-28: 0.3 ug/kg/min via INTRAVENOUS
  Filled 2012-09-28: qty 100

## 2012-09-28 MED ORDER — SODIUM CHLORIDE 0.9 % IJ SOLN: 3.0000 mL | INTRAMUSCULAR | Status: DC | PRN

## 2012-09-28 MED ORDER — METOPROLOL TARTRATE 25 MG/10 ML ORAL SUSPENSION
12.5000 mg | Freq: Two times a day (BID) | ORAL | Status: DC
  Filled 2012-09-28 (×5): qty 5

## 2012-09-28 MED ORDER — BISACODYL 10 MG RE SUPP: 10.0000 mg | Freq: Every day | RECTAL | Status: DC

## 2012-09-28 SURGICAL SUPPLY — 121 items
ATRICLIP EXCLUSION 35 STD HAND (Clip) ×3 IMPLANT
ATTRACTOMAT 16X20 MAGNETIC DRP (DRAPES) ×3 IMPLANT
BAG DECANTER FOR FLEXI CONT (MISCELLANEOUS) ×3 IMPLANT
BANDAGE ELASTIC 4 VELCRO ST LF (GAUZE/BANDAGES/DRESSINGS) ×3 IMPLANT
BANDAGE ELASTIC 6 VELCRO ST LF (GAUZE/BANDAGES/DRESSINGS) ×3 IMPLANT
BANDAGE GAUZE ELAST BULKY 4 IN (GAUZE/BANDAGES/DRESSINGS) ×3 IMPLANT
BENZOIN TINCTURE PRP APPL 2/3 (GAUZE/BANDAGES/DRESSINGS) ×3 IMPLANT
BLADE STERNUM SYSTEM 6 (BLADE) ×3 IMPLANT
BLADE SURG 11 STRL SS (BLADE) ×3 IMPLANT
BLADE SURG ROTATE 9660 (MISCELLANEOUS) IMPLANT
CANISTER SUCTION 2500CC (MISCELLANEOUS) ×3 IMPLANT
CANN PRFSN .5XCNCT 15X34-48 (MISCELLANEOUS) ×2
CANNULA AORTIC HI-FLOW 6.5M20F (CANNULA) ×3 IMPLANT
CANNULA PRFSN .5XCNCT 15X34-48 (MISCELLANEOUS) ×2 IMPLANT
CANNULA VEN 2 STAGE (MISCELLANEOUS) ×4 IMPLANT
CARDIOBLATE CARDIAC ABLATION (MISCELLANEOUS)
CATH CPB KIT GERHARDT (MISCELLANEOUS) ×3 IMPLANT
CATH ROBINSON RED A/P 18FR (CATHETERS) ×6 IMPLANT
CATH THORACIC 28FR (CATHETERS) ×3 IMPLANT
CATH THORACIC 36FR (CATHETERS) IMPLANT
CATH THORACIC 36FR RT ANG (CATHETERS) IMPLANT
CLAMP ISOLATOR SYNERGY LG (MISCELLANEOUS) ×3 IMPLANT
CLIP TI MEDIUM 24 (CLIP) IMPLANT
CLIP TI WIDE RED SMALL 24 (CLIP) IMPLANT
CLOTH BEACON ORANGE TIMEOUT ST (SAFETY) ×3 IMPLANT
COVER SURGICAL LIGHT HANDLE (MISCELLANEOUS) ×3 IMPLANT
CRADLE DONUT ADULT HEAD (MISCELLANEOUS) ×3 IMPLANT
DEVICE CARDIOBLATE CARDIAC ABL (MISCELLANEOUS) IMPLANT
DRAIN CHANNEL 28F RND 3/8 FF (WOUND CARE) ×3 IMPLANT
DRAPE CARDIOVASCULAR INCISE (DRAPES) ×1
DRAPE SLUSH/WARMER DISC (DRAPES) ×3 IMPLANT
DRAPE SRG 135X102X78XABS (DRAPES) ×2 IMPLANT
DRSG COVADERM 4X14 (GAUZE/BANDAGES/DRESSINGS) ×3 IMPLANT
ELECT BLADE 4.0 EZ CLEAN MEGAD (MISCELLANEOUS) ×3
ELECT CAUTERY BLADE 6.4 (BLADE) ×3 IMPLANT
ELECT REM PT RETURN 9FT ADLT (ELECTROSURGICAL) ×6
ELECTRODE BLDE 4.0 EZ CLN MEGD (MISCELLANEOUS) ×2 IMPLANT
ELECTRODE REM PT RTRN 9FT ADLT (ELECTROSURGICAL) ×4 IMPLANT
GLOVE BIO SURGEON STRL SZ 6 (GLOVE) ×18 IMPLANT
GLOVE BIO SURGEON STRL SZ 6.5 (GLOVE) ×18 IMPLANT
GLOVE BIO SURGEON STRL SZ7 (GLOVE) IMPLANT
GLOVE BIO SURGEON STRL SZ7.5 (GLOVE) IMPLANT
GLOVE BIOGEL PI IND STRL 6 (GLOVE) ×2 IMPLANT
GLOVE BIOGEL PI IND STRL 6.5 (GLOVE) ×4 IMPLANT
GLOVE BIOGEL PI IND STRL 7.0 (GLOVE) IMPLANT
GLOVE BIOGEL PI INDICATOR 6 (GLOVE) ×1
GLOVE BIOGEL PI INDICATOR 6.5 (GLOVE) ×2
GLOVE BIOGEL PI INDICATOR 7.0 (GLOVE)
GOWN STRL NON-REIN LRG LVL3 (GOWN DISPOSABLE) ×24 IMPLANT
HEMOSTAT POWDER SURGIFOAM 1G (HEMOSTASIS) ×9 IMPLANT
HEMOSTAT SURGICEL 2X14 (HEMOSTASIS) ×3 IMPLANT
INSERT FOGARTY 61MM (MISCELLANEOUS) IMPLANT
INSERT FOGARTY XLG (MISCELLANEOUS) IMPLANT
KIT BASIN OR (CUSTOM PROCEDURE TRAY) ×3 IMPLANT
KIT ROOM TURNOVER OR (KITS) ×3 IMPLANT
KIT SUCTION CATH 14FR (SUCTIONS) ×6 IMPLANT
KIT VASOVIEW W/TROCAR VH 2000 (KITS) ×3 IMPLANT
LEAD PACING EPI (Prosthesis & Implant Heart) ×3 IMPLANT
LEAD PACING MYOCARDI (MISCELLANEOUS) ×3 IMPLANT
LINE VENT (MISCELLANEOUS) ×3 IMPLANT
MARKER GRAFT CORONARY BYPASS (MISCELLANEOUS) ×9 IMPLANT
NS IRRIG 1000ML POUR BTL (IV SOLUTION) ×18 IMPLANT
PACK OPEN HEART (CUSTOM PROCEDURE TRAY) ×3 IMPLANT
PAD ARMBOARD 7.5X6 YLW CONV (MISCELLANEOUS) ×6 IMPLANT
PAD ELECT DEFIB RADIOL ZOLL (MISCELLANEOUS) ×3 IMPLANT
PENCIL BUTTON HOLSTER BLD 10FT (ELECTRODE) ×3 IMPLANT
PROBE CRYO2-ABLATION MALLABLE (MISCELLANEOUS) IMPLANT
PUNCH AORTIC ROTATE 4.0MM (MISCELLANEOUS) ×3 IMPLANT
PUNCH AORTIC ROTATE 4.5MM 8IN (MISCELLANEOUS) IMPLANT
PUNCH AORTIC ROTATE 5MM 8IN (MISCELLANEOUS) IMPLANT
SET CARDIOPLEGIA MPS 5001102 (MISCELLANEOUS) ×3 IMPLANT
SOLUTION ANTI FOG 6CC (MISCELLANEOUS) IMPLANT
SPONGE GAUZE 4X4 12PLY (GAUZE/BANDAGES/DRESSINGS) ×3 IMPLANT
SPONGE LAP 18X18 X RAY DECT (DISPOSABLE) ×6 IMPLANT
SPONGE LAP 4X18 X RAY DECT (DISPOSABLE) IMPLANT
STRIP CLOSURE SKIN 1/4X4 (GAUZE/BANDAGES/DRESSINGS) ×3 IMPLANT
SUT BONE WAX W31G (SUTURE) ×3 IMPLANT
SUT MNCRL AB 4-0 PS2 18 (SUTURE) IMPLANT
SUT PROLENE 3 0 SH DA (SUTURE) IMPLANT
SUT PROLENE 3 0 SH1 36 (SUTURE) ×3 IMPLANT
SUT PROLENE 4 0 RB 1 (SUTURE)
SUT PROLENE 4 0 SH DA (SUTURE) IMPLANT
SUT PROLENE 4 0 TF (SUTURE) ×6 IMPLANT
SUT PROLENE 4-0 RB1 .5 CRCL 36 (SUTURE) IMPLANT
SUT PROLENE 5 0 C 1 36 (SUTURE) ×3 IMPLANT
SUT PROLENE 6 0 C 1 30 (SUTURE) IMPLANT
SUT PROLENE 6 0 CC (SUTURE) ×9 IMPLANT
SUT PROLENE 7 0 BV 1 (SUTURE) IMPLANT
SUT PROLENE 7 0 BV1 MDA (SUTURE) ×3 IMPLANT
SUT PROLENE 7.0 RB 3 (SUTURE) IMPLANT
SUT PROLENE 8 0 BV175 6 (SUTURE) ×18 IMPLANT
SUT SILK  1 MH (SUTURE)
SUT SILK 1 MH (SUTURE) IMPLANT
SUT SILK 2 0 SH CR/8 (SUTURE) IMPLANT
SUT SILK 3 0 SH CR/8 (SUTURE) IMPLANT
SUT STEEL 6MS V (SUTURE) ×3 IMPLANT
SUT STEEL STERNAL CCS#1 18IN (SUTURE) IMPLANT
SUT STEEL SZ 6 DBL 3X14 BALL (SUTURE) ×3 IMPLANT
SUT VIC AB 1 CTX 18 (SUTURE) ×6 IMPLANT
SUT VIC AB 1 CTX 36 (SUTURE)
SUT VIC AB 1 CTX36XBRD ANBCTR (SUTURE) IMPLANT
SUT VIC AB 2-0 CT1 27 (SUTURE) ×1
SUT VIC AB 2-0 CT1 TAPERPNT 27 (SUTURE) ×2 IMPLANT
SUT VIC AB 2-0 CTX 27 (SUTURE) ×3 IMPLANT
SUT VIC AB 3-0 SH 27 (SUTURE) ×1
SUT VIC AB 3-0 SH 27X BRD (SUTURE) ×2 IMPLANT
SUT VIC AB 3-0 X1 27 (SUTURE) IMPLANT
SUT VICRYL 4-0 PS2 18IN ABS (SUTURE) ×3 IMPLANT
SUTURE E-PAK OPEN HEART (SUTURE) ×3 IMPLANT
SYS ATRICLIP LAA EXCLUSION 45 (CLIP) IMPLANT
SYSTEM SAHARA CHEST DRAIN ATS (WOUND CARE) ×3 IMPLANT
TAPE CLOTH SURG 4X10 WHT LF (GAUZE/BANDAGES/DRESSINGS) ×3 IMPLANT
TAPE PAPER 2X10 WHT MICROPORE (GAUZE/BANDAGES/DRESSINGS) ×3 IMPLANT
TOWEL OR 17X24 6PK STRL BLUE (TOWEL DISPOSABLE) ×6 IMPLANT
TOWEL OR 17X26 10 PK STRL BLUE (TOWEL DISPOSABLE) ×6 IMPLANT
TRAY FOLEY IC TEMP SENS 14FR (CATHETERS) ×3 IMPLANT
TUBE FEEDING 8FR 16IN STR KANG (MISCELLANEOUS) ×3 IMPLANT
TUBE SUCT INTRACARD DLP 20F (MISCELLANEOUS) ×3 IMPLANT
TUBING INSUFFLATION 10FT LAP (TUBING) ×3 IMPLANT
UNDERPAD 30X30 INCONTINENT (UNDERPADS AND DIAPERS) ×3 IMPLANT
WATER STERILE IRR 1000ML POUR (IV SOLUTION) ×6 IMPLANT

## 2012-09-28 NOTE — Anesthesia Procedure Notes (Signed)
Procedure Name: Intubation Date/Time: 09/28/2012 8:00 AM Performed by: Margaree Mackintosh Pre-anesthesia Checklist: Patient identified, Timeout performed, Emergency Drugs available, Suction available and Patient being monitored Patient Re-evaluated:Patient Re-evaluated prior to inductionOxygen Delivery Method: Circle system utilized Preoxygenation: Pre-oxygenation with 100% oxygen Intubation Type: IV induction Ventilation: Mask ventilation without difficulty and Oral airway inserted - appropriate to patient size Laryngoscope Size: Mac and 3 (Glidescope) Grade View: Grade III Tube type: Oral Tube size: 8.0 mm Number of attempts: 2 Airway Equipment and Method: Stylet and Video-laryngoscopy Placement Confirmation: ETT inserted through vocal cords under direct vision,  positive ETCO2 and breath sounds checked- equal and bilateral Secured at: 24 cm Tube secured with: Tape Dental Injury: Teeth and Oropharynx as per pre-operative assessment  Comments: DLx1 grade III view, anterior larynx esophageal intubation, tube removed mask ventilation resumed. DLx2 with glidescope Grade II view ETT passed +EtCO2 bilateral breath sounds equal

## 2012-09-28 NOTE — H&P (Signed)
[address]       [place] [address]             [phone]                    Sherre Wooton [facility] Medical Record #[id number] Date of Birth: 06-27-1950  Referring: Dr Royann Shivers Primary Care: Evette Georges, MD  Chief Complaint:    Chest pain    History of Present Illness:      [name] has recently undergone extensive evaluation for coronary problems by 2 different cardiology practices. [name] has diabetes mellitus type 2, hyperlipidemia and hypertension and had evaluation in [place] roughly 10 years ago, it is unclear if this was a myocardial infarction or arrhythmia. This was apparently treated with conservative means. She was recently admitted to [facility] with atrial fibrillation and rapid ventricular response. She has a new onset left bundle branch block and there was initial concern about ventricular tachycardia. She converted to normal sinus rhythm with treatment with amiodarone. There was no evidence of an acute ischemic injury by biochemical markers.  She subsequently undergone 3 different cardiac imaging studies. Her echocardiogram described a left ventricular ejection fraction of 35% with inferoseptal hypokinesia and moderate pulmonary hypertension with an estimated PA pressure of 59 mm Hg. The left atrium was described as mildly enlarged and there was mild left ventricular hypertrophy. She subsequently underwent cardiac catheterization by Dr. Peter Swaziland, maximizing her medical treatment and stress testing was recommended.  They showed only a very small area of reduced perfusion (mostly fixed, possibly mild ischemia) in the apex. The ejection fraction was estimated to be much better at 52%.  She has not had any clinical recurrence of atrial fibrillation. She is currently receiving treatment with amiodarone but at a lower dose (the initial loading dose caused nausea). She has not had any bleeding problems. She has no history of stroke or  tremors ischemic attack or other embolic events. She has NYHA functional class II. She only expresses symptoms of shortness of breath and very mild chest tightness when she hurries.  She has taken nitroglycerin sublingually since her hospital discharge.  Since last seen the patient has had at least one episode of classic anginal symptoms with relief after 3 nitroglycerin.    Current Activity/ Functional Status:  Patient is independent with mobility/ambulation, transfers, ADL's, IADL's.  Zubrod Score: At the time of surgery this patient's most appropriate activity status/level should be described as: []  Normal activity, no symptoms [x]  Symptoms, fully ambulatory []  Symptoms, in bed less than or equal to 50% of the time []  Symptoms, in bed greater than 50% of the time but less than 100% []  Bedridden []  Moribund   Past Medical History  Diagnosis Date  . Diabetes mellitus type II   . Hyperlipidemia   . Hypertension   . Allergic rhinitis   . Cataract   . CAD (coronary artery disease)     a. 2004: s/p MI in [place]. No PCI->Medical RX;  b. 07/2012 Cath: LM 30-40, LAD 70p, 70/34m, D1 80-90p, OM1 small 90p, OM2 large 80-90p, 28m, 70-80d, RCA 20-30 diff, EF 40%, glob HK.  . Ischemic cardiomyopathy     a. 07/2012 Echo: EF 35%, Sev inferoseptal HK, mildly dil LA, Peak PASP .  Marland Kitchen PAF (paroxysmal atrial fibrillation)     a. 07/2012: Amio and xarelto initiated.  Marland Kitchen LBBB (left bundle branch block)     a. intermittent - present  during rapid afib 07/2012.  . Breast cancer 2002    Patient reports left breast cancer diagnosis in 2002 treated with bilateral mastectomy positive lymph nodes with left axillary dissection followed by chemotherapy with Taxotere and Adriamycin  . Neuromuscular disorder     Patient reports chronic numbness in the right foot related to previous surgery on the right leg and "nerve damage"    Past Surgical History  Procedure Laterality Date  . Mastectomy  2002     bilateral  . Tubal ligation  1987  . Bladder surgery  2000  . Right leg surgery  2001    torn ligaments and tendons x4 surgeries  . Appendectomy  1974  . Cholecystectomy  1974  . Abdominal hysterectomy  2000  . Breast surgery      Bilateral mastectomy for left breast cancer unknown stage but patient reports positive nodes was treated with chemotherapy  . Abdominal hysterectomy      Family History  Problem Relation Age of Onset  . Kidney failure Mother   . Heart disease Mother     Died mid 68s complications of diabetes  . Arthritis Other   . Cancer Other     colon, ovarian  . Diabetes Other   . Hyperlipidemia Other   . Kidney failure Other   . Colon cancer Sister     History   Social History  . Marital Status: Married    Spouse Name: N/A    Number of Children: 3  . Years of Education: N/A   Occupational History  . Office work    Social History Main Topics  . Smoking status: Former Smoker -- 1.00 packs/day for 10 years    Types: Cigarettes    Quit date: 03/11/2012  . Smokeless tobacco: Never Used     Comment: Quit in January  . Alcohol Use: No  . Drug Use: No  . Sexually Active: Not on file   Other Topics Concern  . Not on file   Social History Narrative   Lives with husband and mother in law.      History  Smoking status  . Former Smoker -- 1.00 packs/day for 10 years  . Types: Cigarettes  . Quit date: 03/11/2012  Smokeless tobacco  . Never Used    Comment: Quit in January    History  Alcohol Use No     No Known Allergies  Current Facility-Administered Medications  Medication Dose Route Frequency Provider Last Rate Last Dose  . aminocaproic acid (AMICAR) 10 g in sodium chloride 0.9 % 100 mL infusion   Intravenous To OR Anabel Bene, RPH      . cefUROXime (ZINACEF) 1.5 g in dextrose 5 % 50 mL IVPB  1.5 g Intravenous To OR Anabel Bene, RPH      . cefUROXime (ZINACEF) 750 mg in dextrose 5 % 50 mL IVPB  750 mg Intravenous To OR Anabel Bene, RPH       . chlorhexidine (HIBICLENS) 4 % liquid 2 application  30 mL Topical UD Delight Ovens, MD      . dexmedetomidine (PRECEDEX) 400 MCG/100ML infusion  0.1-0.7 mcg/kg/hr Intravenous To OR Anabel Bene, RPH      . DOPamine (INTROPIN) 800 mg in dextrose 5 % 250 mL infusion  2-20 mcg/kg/min Intravenous To OR Anabel Bene, RPH      . EPINEPHrine (ADRENALIN) 4,000 mcg in dextrose 5 % 250 mL infusion  0.5-20 mcg/min Intravenous To OR Anabel Bene, RPH      .  heparin 2,500 Units, papaverine 30 mg in electrolyte-148 (PLASMALYTE-148) 500 mL irrigation   Irrigation To OR Anabel Bene, RPH      . heparin 30,000 units/NS 1000 mL solution for CELLSAVER   Other To OR Anabel Bene, [facility]      . insulin regular (NOVOLIN R,HUMULIN R) 1 Units/mL in sodium chloride 0.9 % 100 mL infusion   Intravenous To OR Anabel Bene, RPH      . magnesium sulfate (IV Push/IM) injection 40 mEq  40 mEq Other To OR Anabel Bene, RPH      . metoprolol tartrate (LOPRESSOR) tablet 12.5 mg  12.5 mg Oral Once Delight Ovens, MD      . nitroGLYCERIN 0.2 mg/mL in dextrose 5 % infusion  2-200 mcg/min Intravenous To OR Anabel Bene, RPH      . phenylephrine (NEO-SYNEPHRINE) 20,000 mcg in dextrose 5 % 250 mL infusion  30-200 mcg/min Intravenous To OR Anabel Bene, RPH      . potassium chloride injection 80 mEq  80 mEq Other To OR Anabel Bene, RPH      . vancomycin (VANCOCIN) 1,250 mg in sodium chloride 0.9 % 250 mL IVPB  1,250 mg Intravenous To OR Anabel Bene, RPH           Review of Systems:     Cardiac Review of Systems: Y or N  Chest Pain Cove.Etienne    ]  Resting SOB [  y ] Exertional SOB  [  y]  Orthopnea Cove.Etienne  ]   Pedal Edema Cove.Etienne   ]    Palpitations [ y ] Syncope  [ n ]   Presyncope [ y  ]  General Review of Systems: [Y] = yes [  ]=no Constitional: recent weight change [  ]; anorexia [  ]; fatigue [ y ]; nausea [  ]; night sweats [  ]; fever [n  ]; or chills [ n ];                                                                                                                                           Dental: poor dentition[  ]; Last Dentist visit:   Eye : blurred vision [  ]; diplopia [   ]; vision changes [  ];  Amaurosis fugax[  ]; Resp: cough [ n ];  wheezing[n  ];  hemoptysis[ n ]; shortness of breath[y  ]; paroxysmal nocturnal dyspnea[ y ]; dyspnea on exertion[ y ]; or orthopnea[  ];  GI:  gallstones[  ], vomiting[  ];  dysphagia[  ]; melena[  ];  hematochezia [  ]; heartburn[  ];   Hx of  Colonoscopy[  ]; GU: kidney stones [  ]; hematuria[n  ];   dysuria [  ];  nocturia[  ];  history of     obstruction [  ]; urinary frequency [  ]  Skin: rash, swelling[  ];, hair loss[  ];  peripheral edema[  ];  or itching[  ]; Musculosketetal: myalgias[  ];  joint swelling[  ];  joint erythema[  ];  joint pain[  ];  back pain[  ];  Heme/Lymph: bruising[  ];  bleeding[  ];  anemia[  ];  Neuro: TIA[ n ];  headaches[n  ];  stroke[  ];  vertigo[  ];  seizures[  ];   paresthesias[  ];  difficulty walking[ chronic rt leg numbness ];  Psych:depression[  ]; anxiety[  ];  Endocrine: diabetes[y  ];  thyroid dysfunction[ n ];  Immunizations: Flu [ y ]; Pneumococcal[ y ];  Other:  Physical Exam: BP 127/72  Pulse 68  Temp(Src) 98 F (36.7 C) (Oral)  Resp 18  Wt 159 lb (72.122 kg)  BMI 26.88 kg/m2  SpO2 99%  General appearance: alert, cooperative, appears older than stated age and no distress Neurologic: intact Heart: regular rate and rhythm, S1, S2 normal, no murmur, click, rub or gallop and normal apical impulse Lungs: clear to auscultation bilaterally/bilaterial breast implants Abdomen: soft, non-tender; bowel sounds normal; no masses,  no organomegaly Extremities: extremities normal, atraumatic, no cyanosis or edema and Homans sign is negative, no sign of DVT Patient has no cervical or supraclavicular adenopathy There is a faint right carotid bruit   Diagnostic Studies & Laboratory data:     Recent Radiology Findings:  07/27/2012:  nuclear stress test: Impression  Exercise Capacity: Adenosine study with no exercise.  BP Response: Normal blood pressure response.  Clinical Symptoms: chest pressure  ECG Impression: No significant ST segment change suggestive of ischemia.  Comparison with Prior Nuclear Study: No images to compare  Overall Impression: It is known that this patient has an intermittent left bundle branch block. Today the patient had sinus rhythm and left bundle branch block. The images show a small area of decreased uptake in the apical anterior segment and apical cap. There is question that there could be slight ischemia in this area. There are wall motion abnormalities that would go along with the patient's left bundle branch block. It is possible that all of the findings are related to the left bundle-branch block. I cannot absolutely rule out the possibility of slight ischemia in the apical anterior segment.  LV Ejection Fraction: 52%. LV Wall Motion: There is dyssynergy of the septum it extends to the apex.  Michelle Rough, MD     Recent Lab Findings: Lab Results  Component Value Date   WBC 7.7 09/25/2012   HGB 11.8* 09/25/2012   HCT 33.9* 09/25/2012   PLT 230 09/25/2012   GLUCOSE 294* 09/25/2012   CHOL 144 07/18/2012   TRIG 530* 07/18/2012   HDL 39* 07/18/2012   LDLDIRECT 134.3 09/19/2009   LDLCALC UNABLE TO CALCULATE IF TRIGLYCERIDE OVER 400 mg/dL 6/96/2952   ALT 22 8/41/3244   AST 14 09/25/2012   NA 135 09/25/2012   K 4.2 09/25/2012   CL 100 09/25/2012   CREATININE 0.76 09/25/2012   BUN 15 09/25/2012   CO2 23 09/25/2012   TSH 2.447 07/18/2012   INR 1.13 09/25/2012   HGBA1C 9.1* 09/25/2012   Cardiac Cath: Cardiac Catheterization Procedure Note  Name: [name]  MRN: [id number]  DOB: 11/04/1950  Procedure: Left Heart Cath, Selective Coronary Angiography, LV angiography  Indication: 62 year old and an and and and a female with history of coronary disease, diabetes, hyperlipidemia, and tobacco abuse  presents with symptoms of acute chest pressure associated  with atrial fibrillation with a rapid ventricular response. She had a new left bundle branch block.  Procedural Details: The right wrist was prepped, draped, and anesthetized with 1% lidocaine. Using the modified Seldinger technique, a 5 French sheath was introduced into the right radial artery. 3 mg of verapamil was administered through the sheath, weight-based unfractionated heparin was administered intravenously. Standard Judkins catheters were used for selective coronary angiography and left ventriculography. Catheter exchanges were performed over an exchange length guidewire. There were no immediate procedural complications. A TR band was used for radial hemostasis at the completion of the procedure. The patient was transferred to the post catheterization recovery area for further monitoring.  Procedural Findings:  Hemodynamics:  AO 115/58 with a mean of 81 mmHg  LV 117/18 mmHg  Coronary angiography:  Coronary dominance: right  Left mainstem: There is diffuse narrowing of the left main to 30-40%.  Left anterior descending (LAD): The left anterior descending artery is moderately calcified throughout the proximal to mid vessel. There is diffuse disease throughout the proximal to mid segment. The proximal vessel there is a to 70% stenosis. In the mid vessel there is diffuse 70% disease with one focal area of 80%. The first diagonal is a moderate-sized vessel with an 80-90% stenosis proximally.  Left circumflex (LCx): The left circumflex gives rise to 2 marginal vessels before terminating in a third marginal branch. The first marginal vessel is very small in caliber with diffuse disease up to 90%. The second marginal branch is a large branch which has an 80-90% stenosis proximally. This is followed by diffuse 50% disease in the mid vessel and a 70-80% stenosis distally after the bifurcation.  Right coronary artery (RCA): The right coronary is a  dominant vessel. It is mildly calcified proximally. There are diffuse irregularities in the vessel up to 20-30%.  Left ventriculography: Left ventricular systolic function is abnormal, LVEF is estimated at 40%, there is global hypokinesis, there is no significant mitral regurgitation  Final Conclusions:  1. Diffuse 2 vessel obstructive coronary artery disease  2. Moderate left ventricular dysfunction.  Recommendations: We will optimize her medical therapy. Continue amiodarone for treatment of her atrial fibrillation. Given her high [place] score would recommend anticoagulation. Recommend ischemia/viability assessment as an outpatient. If this demonstrates significant ischemia/viability would recommend coronary bypass surgery.  [name] [facility]  07/20/2012, 8:07 AM          Assessment / Plan:   Diffuse coronary occlusive disease with LV dysfunction with ongoing angina History of previous breast cancer  node positive treated with  Adriamycin, no radiation, poss complicating factor in cardiomyopathy Poorly controlled DM Paroxysmal atrial fibrillationwith LBBB Bilateral Breast implants Chronic nerve damage to rt leg, will try to get vein from left leg  The patient was seen in the office today her symptoms reviewed and findings discussed with her. She  obtained her medical records from [place] in regard to her previous exposure to possible cardiac toxic medications. Since she was last seen she is continued to have episodic angina. I've reviewed her films with Dr. Swaziland and Dr. Rennis Golden, in addition obtained the previous cath films from 2009 which shows a steady progression of diffuse three-vessel coronary artery disease. With her ongoing angina I recommended proceeding with coronary artery bypass grafting and MAZE . The risks and options are discussed with her in detail. She will stop her anticoagulation preoperatively and hold the Glucophage and Cozaar immediately preop. We tentatively plan to  proceed with surgery July 21. Case discussed with  Dr Izetta Dakin, who recommend placement of epicardial lv lead, with LBBB and deceased lv function may need biv pacing in futuer  The goals risks and alternatives of the planned surgical procedure CABG, MAZE, epicardial lead placement have been discussed with the patient in detail. The risks of the procedure including death, infection, stroke, myocardial infarction, bleeding, blood transfusion have all been discussed specifically.  I have quoted [name] a 3 % of perioperative mortality and a complication rate as high as  25 %. The patient's questions have been answered.[name] is willing  to proceed with the planned procedure.   Delight Ovens MD      [address] [place] [place] [address] Office [phone]   Beeper [phone]  09/28/2012 7:13 AM

## 2012-09-28 NOTE — Transfer of Care (Signed)
Immediate Anesthesia Transfer of Care Note  Patient: Victor Granados  Procedure(s) Performed: Procedure(s) with comments: CORONARY ARTERY BYPASS GRAFTING (CABG) (N/A) - CABG x four, using left internal mammary artery and left leg greater saphenous vein harvested endoscopically MAZE (N/A) INTRAOPERATIVE TRANSESOPHAGEAL ECHOCARDIOGRAM (N/A) EPICARDIAL PACING LEAD PLACEMENT (N/A) - LV LEAD PLACEMENT  Patient Location: ICU 2313  Anesthesia Type:General  Level of Consciousness: sedated and Patient remains intubated per anesthesia plan  Airway & Oxygen Therapy: Patient remains intubated per anesthesia plan and Patient placed on Ventilator (see vital sign flow sheet for setting)  Post-op Assessment: Post -op Vital signs reviewed and stable and report to [place], RN  Post vital signs: Reviewed and stable  Complications: No apparent anesthesia complications

## 2012-09-28 NOTE — Anesthesia Preprocedure Evaluation (Addendum)
Anesthesia Evaluation  Patient identified by MRN, date of birth, ID band Patient awake    Reviewed: Allergy & Precautions, H&P , NPO status , Patient's Chart, lab work & pertinent test results  History of Anesthesia Complications Negative for: history of anesthetic complications  Airway Mallampati: II TM Distance: >3 FB Neck ROM: Full    Dental  (+) Teeth Intact and Dental Advisory Given   Pulmonary former smoker,  breath sounds clear to auscultation        Cardiovascular hypertension, Pt. on medications + angina with exertion + CAD and + Past MI + dysrhythmias Atrial Fibrillation Rhythm:Regular     Neuro/Psych    GI/Hepatic Neg liver ROS, GERD-  Controlled,  Endo/Other  diabetes, Poorly Controlled, Type 2, Oral Hypoglycemic Agents  Renal/GU negative Renal ROS     Musculoskeletal negative musculoskeletal ROS (+)   Abdominal   Peds  Hematology   Anesthesia Other Findings   Reproductive/Obstetrics                          Anesthesia Physical Anesthesia Plan  ASA: IV  Anesthesia Plan: General   Post-op Pain Management:    Induction: Intravenous  Airway Management Planned: Oral ETT  Additional Equipment:   Intra-op Plan:   Post-operative Plan: Post-operative intubation/ventilation  Informed Consent: I have reviewed the patients History and Physical, chart, labs and discussed the procedure including the risks, benefits and alternatives for the proposed anesthesia with the patient or authorized representative who has indicated his/her understanding and acceptance.   Dental advisory given  Plan Discussed with: CRNA, Anesthesiologist and Surgeon  Anesthesia Plan Comments:        Anesthesia Quick Evaluation

## 2012-09-28 NOTE — Progress Notes (Signed)
Dr. Tyrone Sage notified via telephone of pt. ABG results and Potassium 3.0. Orders received for volumes to be set at 550. RT notified and changes made. Additional order received for ABG 1 hour after changes made - placed. No further orders received. Will continue to follow protocol, monitor, educate, assess closely. Gildardo Pounds

## 2012-09-28 NOTE — OR Nursing (Signed)
12:50pm - called vol. Desk to inform family off pump, 1st call to SICU.

## 2012-09-28 NOTE — Procedures (Signed)
**Note De-Identified [place] Obfuscation** Extubation Procedure Note  Patient Details:   Name: [name] DOB: [birth year] MRN: [id number]   Airway Documentation:  Airway 8 mm (Active)  Secured at (cm) 22 cm 09/28/2012  4:15 PM  Measured From Lips 09/28/2012  4:15 PM  Secured Location Right 09/28/2012  4:15 PM  Secured By Pink Tape 09/28/2012  4:15 PM  Site Condition Dry 09/28/2012  4:15 PM    Evaluation  O2 sats: stable throughout Complications: No apparent complications Patient did tolerate procedure well. Bilateral Breath Sounds: Clear;Diminished   Yes + leak, no stridor noted, SAT 98% on 6 LNC, VC 1.8L, NIF-40 Kemari Narez, Megan Salon 09/28/2012, 5:41 PM

## 2012-09-28 NOTE — Brief Op Note (Addendum)
09/28/2012  11:27 AM  PATIENT:  [name]  62 y.o. female  PRE-OPERATIVE DIAGNOSIS:  CAD  POST-OPERATIVE DIAGNOSIS:  CAD  PROCEDURE:    CORONARY ARTERY BYPASS GRAFTING x 4 (LIMA-LAD, SVG-OM1-OM2, SVG-Intermediate)  ENDOSCOPIC VEIN HARVEST LEFT LEG  LEFT SIDED MAZE PROCEDURE  LEFT VENTRICULAR EPICARDIAL PACING LEAD PLACEMENT  PLACEMENT OF LEFT ATRIAL APPENDAGE CLIP  SURGEON:  Delight Ovens, MD  ASSISTANT: Coral Ceo, PA-C  ANESTHESIA:   general  PATIENT CONDITION:  ICU - intubated and hemodynamically stable.  PRE-OPERATIVE WEIGHT: 72 kg

## 2012-09-28 NOTE — Telephone Encounter (Signed)
CABG done today.  Calling about CXR Dr. Tyrone Sage was requesting.

## 2012-09-28 NOTE — Progress Notes (Signed)
TCTS PM Rounds  CABG MAZE Apacing Hemodynamics stable Not bleeding Labs reviewed

## 2012-09-28 NOTE — Progress Notes (Signed)
  Echocardiogram Echocardiogram Transesophageal has been performed.  Margreta Journey 09/28/2012, 11:38 AM

## 2012-09-28 NOTE — Anesthesia Postprocedure Evaluation (Signed)
  Anesthesia Post-op Note  Patient: Yasuko Lapage  Procedure(s) Performed: Procedure(s) with comments: CORONARY ARTERY BYPASS GRAFTING (CABG) (N/A) - CABG x four, using left internal mammary artery and left leg greater saphenous vein harvested endoscopically MAZE (N/A) INTRAOPERATIVE TRANSESOPHAGEAL ECHOCARDIOGRAM (N/A) EPICARDIAL PACING LEAD PLACEMENT (N/A) - LV LEAD PLACEMENT  Patient Location: SICU  Anesthesia Type:General  Level of Consciousness: sedated and Patient remains intubated per anesthesia plan  Airway and Oxygen Therapy: Patient remains intubated per anesthesia plan and Patient placed on Ventilator (see vital sign flow sheet for setting)  Post-op Pain: none  Post-op Assessment: Post-op Vital signs reviewed and Patient's Cardiovascular Status Stable  Post-op Vital Signs: Reviewed  Complications: No apparent anesthesia complications

## 2012-09-28 NOTE — OR Nursing (Signed)
13:15pm - 2nd call to SICU - Judeth Cornfield, charge nurse

## 2012-09-28 NOTE — Preoperative (Signed)
Beta Blockers   Reason not to administer Beta Blockers:Not Applicable 

## 2012-09-29 ENCOUNTER — Encounter (HOSPITAL_COMMUNITY): Payer: Self-pay | Admitting: Cardiothoracic Surgery

## 2012-09-29 ENCOUNTER — Inpatient Hospital Stay (HOSPITAL_COMMUNITY): Payer: BC Managed Care – PPO

## 2012-09-29 DIAGNOSIS — I2589 Other forms of chronic ischemic heart disease: Secondary | ICD-10-CM

## 2012-09-29 DIAGNOSIS — I200 Unstable angina: Secondary | ICD-10-CM

## 2012-09-29 DIAGNOSIS — Z951 Presence of aortocoronary bypass graft: Secondary | ICD-10-CM

## 2012-09-29 DIAGNOSIS — I4891 Unspecified atrial fibrillation: Secondary | ICD-10-CM

## 2012-09-29 DIAGNOSIS — I2510 Atherosclerotic heart disease of native coronary artery without angina pectoris: Principal | ICD-10-CM

## 2012-09-29 LAB — CBC
HCT: 33.8 % — ABNORMAL LOW (ref 36.0–46.0)
HCT: 36.5 % (ref 36.0–46.0)
MCH: 30.7 pg (ref 26.0–34.0)
MCV: 88 fL (ref 78.0–100.0)
RBC: 3.84 MIL/uL — ABNORMAL LOW (ref 3.87–5.11)
RDW: 13.4 % (ref 11.5–15.5)
WBC: 14.9 10*3/uL — ABNORMAL HIGH (ref 4.0–10.5)
WBC: 16.4 10*3/uL — ABNORMAL HIGH (ref 4.0–10.5)

## 2012-09-29 LAB — GLUCOSE, CAPILLARY
Glucose-Capillary: 101 mg/dL — ABNORMAL HIGH (ref 70–99)
Glucose-Capillary: 102 mg/dL — ABNORMAL HIGH (ref 70–99)
Glucose-Capillary: 108 mg/dL — ABNORMAL HIGH (ref 70–99)
Glucose-Capillary: 110 mg/dL — ABNORMAL HIGH (ref 70–99)
Glucose-Capillary: 111 mg/dL — ABNORMAL HIGH (ref 70–99)
Glucose-Capillary: 111 mg/dL — ABNORMAL HIGH (ref 70–99)
Glucose-Capillary: 117 mg/dL — ABNORMAL HIGH (ref 70–99)
Glucose-Capillary: 118 mg/dL — ABNORMAL HIGH (ref 70–99)
Glucose-Capillary: 126 mg/dL — ABNORMAL HIGH (ref 70–99)
Glucose-Capillary: 129 mg/dL — ABNORMAL HIGH (ref 70–99)
Glucose-Capillary: 146 mg/dL — ABNORMAL HIGH (ref 70–99)
Glucose-Capillary: 146 mg/dL — ABNORMAL HIGH (ref 70–99)
Glucose-Capillary: 164 mg/dL — ABNORMAL HIGH (ref 70–99)
Glucose-Capillary: 89 mg/dL (ref 70–99)
Glucose-Capillary: 95 mg/dL (ref 70–99)
Glucose-Capillary: 97 mg/dL (ref 70–99)
Glucose-Capillary: 98 mg/dL (ref 70–99)
Glucose-Capillary: 98 mg/dL (ref 70–99)
Glucose-Capillary: 99 mg/dL (ref 70–99)

## 2012-09-29 LAB — POCT I-STAT, CHEM 8
BUN: 7 mg/dL (ref 6–23)
Calcium, Ion: 1.1 mmol/L — ABNORMAL LOW (ref 1.13–1.30)
Chloride: 99 mEq/L (ref 96–112)
Creatinine, Ser: 0.7 mg/dL (ref 0.50–1.10)
Glucose, Bld: 120 mg/dL — ABNORMAL HIGH (ref 70–99)
HCT: 37 % (ref 36.0–46.0)
Hemoglobin: 12.6 g/dL (ref 12.0–15.0)
Potassium: 4.3 mEq/L (ref 3.5–5.1)
Sodium: 136 mEq/L (ref 135–145)
TCO2: 24 mmol/L (ref 0–100)

## 2012-09-29 LAB — BASIC METABOLIC PANEL
BUN: 6 mg/dL (ref 6–23)
CO2: 24 mEq/L (ref 19–32)
Chloride: 104 mEq/L (ref 96–112)
Creatinine, Ser: 0.38 mg/dL — ABNORMAL LOW (ref 0.50–1.10)
Glucose, Bld: 109 mg/dL — ABNORMAL HIGH (ref 70–99)

## 2012-09-29 LAB — CREATININE, SERUM
Creatinine, Ser: 0.41 mg/dL — ABNORMAL LOW (ref 0.50–1.10)
GFR calc Af Amer: >90 mL/min (ref >90)
GFR calc non Af Amer: >90 mL/min (ref >90)

## 2012-09-29 LAB — MAGNESIUM: Magnesium: 1.9 mg/dL (ref 1.5–2.5)

## 2012-09-29 MED ORDER — INSULIN DETEMIR 100 UNIT/ML ~~LOC~~ SOLN
20.0000 [IU] | Freq: Every day | SUBCUTANEOUS | Status: DC
  Administered 2012-09-29: 20 [IU] via SUBCUTANEOUS
  Filled 2012-09-29 (×2): qty 0.2

## 2012-09-29 MED ORDER — MILRINONE IN DEXTROSE 20 MG/100ML IV SOLN
INTRAVENOUS | Status: AC
  Administered 2012-09-29: 20000 ug
  Filled 2012-09-29: qty 100

## 2012-09-29 MED ORDER — LOSARTAN POTASSIUM 50 MG PO TABS
50.0000 mg | ORAL_TABLET | Freq: Two times a day (BID) | ORAL | Status: DC
  Administered 2012-09-29 – 2012-10-01 (×5): 50 mg via ORAL
  Filled 2012-09-29 (×6): qty 1

## 2012-09-29 MED ORDER — AMIODARONE HCL 200 MG PO TABS
200.0000 mg | ORAL_TABLET | Freq: Every day | ORAL | Status: DC
  Administered 2012-09-29 – 2012-10-02 (×4): 200 mg via ORAL
  Filled 2012-09-29 (×5): qty 1

## 2012-09-29 MED ORDER — FUROSEMIDE 10 MG/ML IJ SOLN
40.0000 mg | Freq: Once | INTRAMUSCULAR | Status: AC
  Administered 2012-09-29: 40 mg via INTRAVENOUS

## 2012-09-29 MED ORDER — INSULIN ASPART 100 UNIT/ML ~~LOC~~ SOLN: 0.0000 [IU] | SUBCUTANEOUS | Status: DC

## 2012-09-29 MED FILL — Lidocaine HCl IV Inj 20 MG/ML: INTRAVENOUS | Qty: 5 | Status: AC

## 2012-09-29 MED FILL — Heparin Sodium (Porcine) Inj 1000 Unit/ML: INTRAMUSCULAR | Qty: 30 | Status: AC

## 2012-09-29 MED FILL — Magnesium Sulfate Inj 50%: INTRAMUSCULAR | Qty: 10 | Status: AC

## 2012-09-29 MED FILL — Sodium Bicarbonate IV Soln 8.4%: INTRAVENOUS | Qty: 50 | Status: AC

## 2012-09-29 MED FILL — Heparin Sodium (Porcine) Inj 1000 Unit/ML: INTRAMUSCULAR | Qty: 20 | Status: AC

## 2012-09-29 MED FILL — Potassium Chloride Inj 2 mEq/ML: INTRAVENOUS | Qty: 40 | Status: AC

## 2012-09-29 MED FILL — Electrolyte-R (PH 7.4) Solution: INTRAVENOUS | Qty: 3000 | Status: AC

## 2012-09-29 MED FILL — Mannitol IV Soln 20%: INTRAVENOUS | Qty: 500 | Status: AC

## 2012-09-29 MED FILL — Sodium Chloride IV Soln 0.9%: INTRAVENOUS | Qty: 1000 | Status: AC

## 2012-09-29 MED FILL — Sodium Chloride Irrigation Soln 0.9%: Qty: 3000 | Status: AC

## 2012-09-29 NOTE — Progress Notes (Signed)
Subjective: Some pain which improved with morphine.  Also some SOB which resolved.  Objective: Vital signs in last 24 hours: Temp:  [97.5 F (36.4 C)-99.3 F (37.4 C)] 99.1 F (37.3 C) (07/22 0700) Pulse Rate:  [72-81] 77 (07/22 0700) Resp:  [11-34] 14 (07/22 0700) BP: (90-138)/(54-82) 120/63 mmHg (07/22 0700) SpO2:  [92 %-100 %] 98 % (07/22 0700) Arterial Line BP: (107-173)/(54-81) 136/62 mmHg (07/22 0700) FiO2 (%):  [40 %-50 %] 40 % (07/21 1640)    Intake/Output from previous day: 07/21 0701 - 07/22 0700 In: 7273.5 [I.V.:5938.5; Blood:505; NG/GT:30; IV Piggyback:800] Out: 4401 [UUVOZ:3664; Emesis/NG output:1; Blood:900; Chest Tube:460] Intake/Output this shift:    Medications Current Facility-Administered Medications  Medication Dose Route Frequency Provider Last Rate Last Dose  . 0.45 % sodium chloride infusion   Intravenous Continuous Wilmon Pali, PA-C 20 mL/hr at 09/28/12 1345 20 mL/hr at 09/28/12 1345  . 0.9 %  sodium chloride infusion   Intravenous Continuous Wilmon Pali, PA-C 20 mL/hr at 09/28/12 1900    . 0.9 %  sodium chloride infusion  250 mL Intravenous Continuous Wilmon Pali, PA-C      . acetaminophen (TYLENOL) tablet 1,000 mg  1,000 mg Oral Q6H Wilmon Pali, PA-C   1,000 mg at 09/29/12 4034   Or  . acetaminophen (TYLENOL) solution 975 mg  975 mg Per Tube Q6H Gina L Collins, PA-C      . albumin human 5 % solution 250 mL  250 mL Intravenous Q15 min PRN Wilmon Pali, PA-C      . amiodarone (PACERONE) tablet 200 mg  200 mg Oral Daily Delight Ovens, MD      . aspirin EC tablet 325 mg  325 mg Oral Daily Wilmon Pali, PA-C       Or  . aspirin chewable tablet 324 mg  324 mg Per Tube Daily Gina L Collins, PA-C      . atorvastatin (LIPITOR) tablet 80 mg  80 mg Oral Daily Gina L Collins, PA-C   80 mg at 09/28/12 1800  . bisacodyl (DULCOLAX) EC tablet 10 mg  10 mg Oral Daily Wilmon Pali, PA-C       Or  . bisacodyl (DULCOLAX) suppository 10 mg  10  mg Rectal Daily Gina L Collins, PA-C      . cefUROXime (ZINACEF) 1.5 g in dextrose 5 % 50 mL IVPB  1.5 g Intravenous Q12H Gina L Collins, PA-C   1.5 g at 09/29/12 0002  . dexmedetomidine (PRECEDEX) 200 MCG/50ML infusion  0.1-0.7 mcg/kg/hr Intravenous Continuous Wilmon Pali, PA-C   0.1 mcg/kg/hr at 09/28/12 1530  . docusate sodium (COLACE) capsule 200 mg  200 mg Oral Daily Wilmon Pali, PA-C      . DOPamine (INTROPIN) 800 mg in dextrose 5 % 250 mL infusion  3 mcg/kg/min Intravenous Titrated Delight Ovens, MD   3 mcg/kg/min at 09/28/12 1615  . famotidine (PEPCID) IVPB 20 mg  20 mg Intravenous Q12H Gina L Collins, PA-C   20 mg at 09/28/12 1408  . insulin aspart (novoLOG) injection 0-24 Units  0-24 Units Subcutaneous Q4H Delight Ovens, MD      . insulin detemir (LEVEMIR) injection 20 Units  20 Units Subcutaneous Q1200 Delight Ovens, MD      . insulin regular (NOVOLIN R,HUMULIN R) 1 Units/mL in sodium chloride 0.9 % 100 mL infusion   Intravenous Continuous Wilmon Pali, PA-C 3.6 mL/hr at 09/29/12 0600    .  insulin regular bolus via infusion 0-10 Units  0-10 Units Intravenous TID WC Gina L Collins, PA-C      . lactated ringers infusion   Intravenous Continuous Wilmon Pali, PA-C 20 mL/hr at 09/28/12 1345 20 mL/hr at 09/28/12 1345  . losartan (COZAAR) tablet 50 mg  50 mg Oral BID Delight Ovens, MD      . metoCLOPramide [facility]) injection 10 mg  10 mg Intravenous Q6H Wilmon Pali, PA-C   10 mg at 09/29/12 0335  . metoprolol (LOPRESSOR) injection 2.5-5 mg  2.5-5 mg Intravenous Q2H PRN Wilmon Pali, PA-C   5 mg at 09/29/12 0516  . metoprolol tartrate (LOPRESSOR) tablet 12.5 mg  12.5 mg Oral BID Wilmon Pali, PA-C   12.5 mg at 09/28/12 1824   Or  . metoprolol tartrate (LOPRESSOR) 25 mg/10 mL oral suspension 12.5 mg  12.5 mg Per Tube BID Wilmon Pali, PA-C      . midazolam (VERSED) injection 2 mg  2 mg Intravenous Q1H PRN Wilmon Pali, PA-C      . morphine 2 MG/ML injection  2-5 mg  2-5 mg Intravenous Q1H PRN Wilmon Pali, PA-C   4 mg at 09/29/12 0738  . nitroGLYCERIN 0.2 mg/mL in dextrose 5 % infusion  0-100 mcg/min Intravenous Continuous Wilmon Pali, PA-C 18 mL/hr at 09/29/12 0245 60 mcg/min at 09/29/12 0245  . ondansetron (ZOFRAN) injection 4 mg  4 mg Intravenous Q6H PRN Wilmon Pali, PA-C   4 mg at 09/28/12 1837  . oxyCODONE (Oxy IR/ROXICODONE) immediate release tablet 5-10 mg  5-10 mg Oral Q3H PRN Wilmon Pali, PA-C      . [START ON 09/30/2012] pantoprazole (PROTONIX) EC tablet 40 mg  40 mg Oral Daily Gina L Collins, PA-C      . phenylephrine (NEO-SYNEPHRINE) 20,000 mcg in dextrose 5 % 250 mL infusion  0-100 mcg/min Intravenous Continuous Wilmon Pali, PA-C   5 mcg/min at 09/28/12 1530  . sodium chloride 0.9 % injection 3 mL  3 mL Intravenous Q12H Gina L Collins, PA-C      . sodium chloride 0.9 % injection 3 mL  3 mL Intravenous PRN Wilmon Pali, PA-C        PE: General appearance: alert, cooperative and no distress Lungs: clear to auscultation bilaterally Heart: regular rate and rhythm, S1, S2 normal, no murmur, click, rub or gallop Extremities: Trace LEE Pulses: 2+ and symmetric Neurologic: Grossly normal  Lab Results:   Recent Labs  09/28/12 1350  09/28/12 2000 09/28/12 2002 09/29/12 0435  WBC 12.7*  --  15.8*  --  14.9*  HGB 12.3  < > 12.3 12.2 11.8*  HCT 35.7*  < > 34.9* 36.0 33.8*  PLT 143*  --  211  --  180  < > = values in this interval not displayed. BMET  Recent Labs  09/28/12 1353 09/28/12 2000 09/28/12 2002 09/29/12 0435  NA 142  --  140 135  K 3.0*  --  3.6 4.3  CL  --   --  103 104  CO2  --   --   --  24  GLUCOSE 183*  --  128* 109*  BUN  --   --  5* 6  CREATININE  --  0.42* 0.60 0.38*  CALCIUM  --   --   --  7.5*   PT/INR  Recent Labs  09/28/12 1350  LABPROT 18.2*  INR 1.55*  Assessment/Plan   Principal Problem:   S/P CABG x 4: 09/28/12 (LIMA-LAD, SVG-OM1-OM2, SVG-Intermediate) Active  Problems:   DIABETES MELLITUS TYPE 1-UNCONTROLLED   Ischemic cardiomyopathy   CAD (coronary artery disease)   Hx of PAF  Plan:  POD#1.  CABG x 4-LIMA-LAD, SVG-OM1-OM2, SVG-Intermediate,  MAZE procedure.  Doing well.   BP stable.  K+ and Mag WNL.   EF 35% by echo in May.  Watch for volume overload. ASA, lipitor, cozaar, lopressor.     LOS: 1 day    HAGER, BRYAN 09/29/2012 8:03 AM  Agree with note written by Jones Skene PAC  POD # 1 CABG X 4, LA MAZE and LAA clipping. H/O PAF and Mod LV dysfunction. Looks great. In NSR. VSS. Hemodynamically stable. Labs OK. Nl progression per TCTS.   Runell Gess 09/29/2012 8:31 AM

## 2012-09-29 NOTE — Care Management Note (Unsigned)
    Page 1 of 1   10/01/2012     3:57:52 PM   CARE MANAGEMENT NOTE 10/01/2012  Patient:  [name], [name]   Account Number:  [id number]  Date Initiated:  09/29/2012  Documentation initiated by:  Avie Arenas  Subjective/Objective Assessment:   Post op CABG x4 and Maze.  Lives with husband     Action/Plan:   Anticipated DC Date:  10/05/2012   Anticipated DC Plan:  HOME W HOME HEALTH SERVICES      DC Planning Services  CM consult      Choice offered to / List presented to:             Status of service:  In process, will continue to follow Medicare Important Message given?   (If response is "NO", the following Medicare IM given date fields will be blank) Date Medicare IM given:   Date Additional Medicare IM given:    Discharge Disposition:    Per UR Regulation:  Reviewed for med. necessity/level of care/duration of stay  If discussed at Long Length of Stay Meetings, dates discussed:    Comments:  Contact:  Armon,David Spouse [phone]  10/01/12 Ellianah Cordy,RN,BSN [phone] PT STATES SHE CAN BORROW RW AT DC, IF NEEDED.  09-29-12 2pm Avie Arenas, RNBSN [phone] Patient laying in bed. Awakes easily - very pleasant.  Plan to go home with husband who will be able to be with her 24/7 on discharge and is able to care for her.  CM will continue to follow for further needs.

## 2012-09-29 NOTE — Op Note (Signed)
[name], STILLE NO.:  [id number]  MEDICAL RECORD NO.:  [id number]  LOCATION:  2313                         FACILITY:  [facility]  PHYSICIAN:  Sheliah Plane, MD    DATE OF BIRTH:  1951/01/22  DATE OF PROCEDURE:  09/28/2012 DATE OF DISCHARGE:                              OPERATIVE REPORT   PREOPERATIVE DIAGNOSES:  Coronary occlusive disease and paroxysmal atrial fibrillation.  POSTOPERATIVE DIAGNOSES:  Coronary occlusive disease and paroxysmal atrial fibrillation.  SURGICAL PROCEDURE: 1. Coronary artery bypass grafting x4 with the left internal mammary     to the left anterior descending coronary artery sequential reverse     saphenous vein graft to the first and second obtuse marginal,     reverse saphenous vein graft to the intermediate coronary artery. 2. Left-sided maze procedure, placement of 35-mm left atrial clip and     placement of permanent left ventricular pacing lead. 3. Left leg endo vein harvesting.  SURGEON:  Sheliah Plane, MD  FIRST ASSIST:  Coral Ceo, P.A.  BRIEF HISTORY:  The patient is a 62 year old female who has known coronary occlusive disease having presented with anginal pain in 2009 and underwent cardiac catheterization in [place].  At that time, she was found to have circumflex and LAD disease but was elected to be treated medically.  She presented to [facility] and rapid atrial fibrillation with chest discomfort.  Enzymes were negative.  Echocardiogram performed demonstrated 30-35% ejection fraction.  She underwent cardiac catheterization by Dr. Peter [place] and initially, the recommendation was continued medical therapy with a Cardiolite stress test.  This was performed and showed some evidence of anterior apical ischemia.  The patient was then referred for consideration of surgery by Dr. Hillary Bow because the patient continued to have episodes of angina at minimal exertion in spite of medical therapy.  Risks and options  of surgery were discussed with the patient in detail and she was willing to proceed.  DESCRIPTION OF THE PROCEDURE:  With Swan-Ganz and arterial line monitors in place, patient underwent general endotracheal anesthesia, TEE probe was placed and the findings are dictated under separate note by Dr. Sondra Come.  She did have depressed LV function, approximately 40%.  Trace mitral regurgitation.  She was in sinus rhythm.  There was no clot in the atrial appendage.  Because of chronic nerve pain in her right leg, we elected to harvest vein from the left leg endoscopically, this from the left thigh and upper calf.  The vein was of good quality.  Median sternotomy was performed.  The left internal mammary artery was dissected down as a pedicle graft.  The distal artery was divided, had good free flow.  The pericardium was opened.  The patient was systemically heparinized.  The ascending aorta was cannulated.  The right atrium was cannulated.  She was placed on cardiopulmonary bypass 2.4 liters/minute/meter squared.  The right and left pulmonary veins were encircled.  Aortic cardioplegia needle was introduced into the ascending aorta.  Aortic crossclamp was applied, 600 mL of cold blood potassium cardioplegia was administered into the aortic root with diastolic arrest of the heart.  Myocardial septal temperatures were monitored throughout the cross-clamp period.  We proceeded first with a left-sided maze procedure with the AtriCure device.  The lesions were created across the 3 lesions, at each site were created across the left atrial appendage, and the right and left pulmonary veins.  We then proceeded with coronary bypass.  The heart was elevated.  The first and second obtuse marginal were parallel to each other and were relatively small and thin walled.  The first vessel was opened and used in a diamond-type side-to-side anastomosis and a running 8-0 Prolene anastomosis was performed.  Distal  extent of the same vein was then carried to short distance to the second branch of obtuse marginal vessel which was slightly smaller, admitted a 1-mm probe distally.  Using a running 8-0 Prolene distal anastomosis was performed.  Attention was then turned to the intermediate coronary artery, which was relatively small vessel but traversed almost to the apex.  The vessel was opened and a 1-mm probe passed distally.  Using running 8-0 Prolene, distal anastomosis was performed.  Attention was then turned to the left anterior descending coronary artery, which was opened between the mid and distal third of the vessel.  There was diffuse disease throughout the LAD.  Using a running 8-0 Prolene, left internal mammary artery was anastomosed to the left anterior descending coronary artery.  With release of the bulldog on the mammary artery, there was rise in myocardial septal temperature.  Bulldog was placed back on the mammary artery.  Additional cold blood cardioplegia was administered down the vein grafts.  The heart was then elevated.  The left atrial appendage was relatively small, based a 35-mm AtriCure.  Left atrial clip was selected and placed on the base of the atrial appendage.  Two punch aortotomies were performed and each of the two vein grafts were anastomosed to the ascending aorta.  Air was evacuated from the grafts and aortic cross-clamp was removed with total cross-clamp time of 90 minutes.  The patient spontaneously converted to a sinus rhythm with a slow rate.  Atrial and ventricular pacing wires were placed, and she was atrially paced to increase rate.  A screw and lead was then placed in the left atrial and the left ventricle, serial number [id number].  The patient was then ventilated and weaned from cardiopulmonary bypass on low-dose dopamine and milrinone.  She remained hemodynamically stable, was decannulated in usual fashion.  Protamine sulfate was administered. The left  ventricular permanent pacing lead which had been placed was tested and had 13 mV R-waves, impedance was 525 ohms and voltage threshold 1.3 V.  Cap was secured on the lead and the lead was tunneled through the pericardium and into a subcutaneous pocket in the left infraclavicular area.  With the patient hemodynamically stable and the operative field hemostatic, pericardium was loosely reapproximated, left chest tube and a Blake mediastinal drain were left in place.  Sternum was closed #6 stainless steel wire.  Fascia was closed with interrupted 0 Vicryl, running 3-0 Vicryl subcutaneous tissue, 4-0 subcuticular stitch in skin edges.  Dry dressings were applied.  Sponge and needle count was reported as correct at completion of procedure.  Because of low preoperative hematocrit, the patient was given 2 units of packed red blood cells while on pump.  Crossclamp time was 90 minutes.  Total pump time 131 minutes.     Sheliah Plane, MD     EG/MEDQ  D:  09/29/2012  T:  09/29/2012  Job:  [id number]  cc:   Dr. Hillary Bow

## 2012-09-29 NOTE — Progress Notes (Signed)
Patient ID: [name], female   DOB: [birth year], 62 y.o.   MRN: [id number] EVENING ROUNDS NOTE :     [address]       [place] [address]             [phone]                 1 Day Post-Op Procedure(s) (LRB): CORONARY ARTERY BYPASS GRAFTING (CABG) (N/A) MAZE (N/A) INTRAOPERATIVE TRANSESOPHAGEAL ECHOCARDIOGRAM (N/A) EPICARDIAL PACING LEAD PLACEMENT (N/A)  Total Length of Stay:  LOS: 1 day  BP 115/63  Pulse 71  Temp(Src) 97.6 F (36.4 C) (Oral)  Resp 13  Wt 159 lb (72.122 kg)  BMI 26.88 kg/m2  SpO2 99%  .Intake/Output     07/21 0701 - 07/22 0700 07/22 0701 - 07/23 0700   I.V. (mL/kg) 6006.2 (83.3) 379.7 (5.3)   Blood 505    NG/GT 30    IV Piggyback 800 50   Total Intake(mL/kg) 7341.2 (101.8) 429.7 (6)   Urine (mL/kg/hr) 3692 (2.1) 210 (0.3)   Emesis/NG output 1 (0)    Blood 900 (0.5)    Chest Tube 460 (0.3) 20 (0)   Total Output 5053 230   Net +2288.2 +199.7        Emesis Occurrence 1 x      . sodium chloride 20 mL/hr (09/28/12 1345)  . sodium chloride 20 mL/hr at 09/28/12 1900  . sodium chloride    . dexmedetomidine Stopped (09/28/12 1600)  . DOPamine Stopped (09/28/12 1700)  . insulin (NOVOLIN-R) infusion Stopped (09/29/12 1400)  . lactated ringers 20 mL/hr at 09/29/12 0845  . nitroGLYCERIN Stopped (09/29/12 1200)  . phenylephrine (NEO-SYNEPHRINE) Adult infusion Stopped (09/28/12 1600)     Lab Results  Component Value Date   WBC 16.4* 09/29/2012   HGB 12.6 09/29/2012   HCT 37.0 09/29/2012   PLT 173 09/29/2012   GLUCOSE 120* 09/29/2012   CHOL 144 07/18/2012   TRIG 530* 07/18/2012   HDL 39* 07/18/2012   LDLDIRECT 134.3 09/19/2009   LDLCALC UNABLE TO CALCULATE IF TRIGLYCERIDE OVER 400 mg/dL 04/24/863   ALT 22 7/84/6962   AST 14 09/25/2012   NA 136 09/29/2012   K 4.3 09/29/2012   CL 99 09/29/2012   CREATININE 0.70 09/29/2012   BUN 7 09/29/2012   CO2 24 09/29/2012   TSH 2.447 07/18/2012   INR 1.55* 09/28/2012   HGBA1C 9.1* 09/25/2012   MICROALBUR 0.2 12/19/2010   Off drips Glucose now 94  In sinus Give lasix   Delight Ovens MD  Beeper [phone] Office [phone] 09/29/2012 6:19 PM

## 2012-09-29 NOTE — Progress Notes (Signed)
Patient ID: [name], female   DOB: Oct 21, 1950, 62 y.o.   MRN: [id number] [facility] DAILY ICU PROGRESS NOTE                   [address]            [name] [address]          [phone]   1 Day Post-Op Procedure(s) (LRB): CORONARY ARTERY BYPASS GRAFTING (CABG) (N/A) MAZE (N/A) INTRAOPERATIVE TRANSESOPHAGEAL ECHOCARDIOGRAM (N/A) EPICARDIAL PACING LEAD PLACEMENT (N/A)  Total Length of Stay:  LOS: 1 day   Subjective: Awake and neuro intact some nausea during night  Objective: Vital signs in last 24 hours: Temp:  [97.5 F (36.4 C)-99.3 F (37.4 C)] 99.1 F (37.3 C) (07/22 0700) Pulse Rate:  [72-81] 77 (07/22 0700) Cardiac Rhythm:  [-] Normal sinus rhythm (07/22 0400) Resp:  [11-34] 14 (07/22 0700) BP: (90-138)/(54-82) 120/63 mmHg (07/22 0700) SpO2:  [92 %-100 %] 98 % (07/22 0700) Arterial Line BP: (107-173)/(54-81) 136/62 mmHg (07/22 0700) FiO2 (%):  [40 %-50 %] 40 % (07/21 1640)  Filed Weights   09/27/12 1300  Weight: 159 lb (72.122 kg)    Weight change:    Hemodynamic parameters for last 24 hours: PAP: (28-42)/(13-28) 32/14 mmHg CO:  [3.5 L/min-4.7 L/min] 3.9 L/min CI:  [2 L/min/m2-2.6 L/min/m2] 2.2 L/min/m2  Intake/Output from previous day: 07/21 0701 - 07/22 0700 In: 7273.5 [I.V.:5938.5; Blood:505; NG/GT:30; IV Piggyback:800] Out: 9518 [ACZYS:0630; Emesis/NG output:1; Blood:900; Chest Tube:460]  Intake/Output this shift:    Current Meds: Scheduled Meds: . acetaminophen  1,000 mg Oral Q6H   Or  . acetaminophen (TYLENOL) oral liquid 160 mg/5 mL  975 mg Per Tube Q6H  . aspirin EC  325 mg Oral Daily   Or  . aspirin  324 mg Per Tube Daily  . atorvastatin  80 mg Oral Daily  . bisacodyl  10 mg Oral Daily   Or  . bisacodyl  10 mg Rectal Daily  . cefUROXime (ZINACEF)  IV  1.5 g Intravenous Q12H  . docusate sodium  200 mg Oral Daily  . famotidine (PEPCID) IV  20 mg Intravenous Q12H  . insulin regular  0-10 Units Intravenous TID WC  .  metoCLOPramide (REGLAN) injection  10 mg Intravenous Q6H  . metoprolol tartrate  12.5 mg Oral BID   Or  . metoprolol tartrate  12.5 mg Per Tube BID  . [START ON 09/30/2012] pantoprazole  40 mg Oral Daily  . sodium chloride  3 mL Intravenous Q12H   Continuous Infusions: . sodium chloride 20 mL/hr (09/28/12 1345)  . sodium chloride 20 mL/hr at 09/28/12 1900  . sodium chloride    . dexmedetomidine Stopped (09/28/12 1600)  . DOPamine Stopped (09/28/12 1700)  . insulin (NOVOLIN-R) infusion 3.6 mL/hr at 09/29/12 0600  . lactated ringers 20 mL/hr (09/28/12 1345)  . milrinone 0.3 mcg/kg/min (09/28/12 1800)  . nitroGLYCERIN 60 mcg/min (09/29/12 0245)  . phenylephrine (NEO-SYNEPHRINE) Adult infusion Stopped (09/28/12 1600)   PRN Meds:.albumin human, metoprolol, midazolam, morphine injection, ondansetron (ZOFRAN) IV, oxyCODONE, sodium chloride  General appearance: alert, cooperative and mild distress Neurologic: intact Heart: regular rate and rhythm, S1, S2 normal, no murmur, click, rub or gallop Lungs: diminished breath sounds bibasilar Abdomen: soft, non-tender; bowel sounds normal; no masses,  no organomegaly Extremities: extremities normal, atraumatic, no cyanosis or edema and Homans sign is negative, no sign of DVT Wound: sternum stable  Lab Results: CBC: Recent Labs  09/28/12 2000 09/28/12 2002  09/29/12 0435  WBC 15.8*  --  14.9*  HGB 12.3 12.2 11.8*  HCT 34.9* 36.0 33.8*  PLT 211  --  180   BMET:  Recent Labs  09/28/12 2002 09/29/12 0435  NA 140 135  K 3.6 4.3  CL 103 104  CO2  --  24  GLUCOSE 128* 109*  BUN 5* 6  CREATININE 0.60 0.38*  CALCIUM  --  7.5*    PT/INR:  Recent Labs  09/28/12 1350  LABPROT 18.2*  INR 1.55*   Radiology: Dg Chest Portable 1 View  09/28/2012   *RADIOLOGY REPORT*  Clinical Data: Status post CABG.  PORTABLE CHEST - 1 VIEW  Comparison: Chest x-ray 09/25/2012.  Findings: An endotracheal tube is in place with tip 1.6 cm above the carina.  Right internal jugular central venous Cordis through which a Swan Ganz catheter has been passed into the distal pulmonic trunk.  Mediastinal drain and left-sided chest tube in position. Status post median sternotomy for CABG and left atrial appendage ligation (an atrial appendage ligation clip is noted).  There is what appears to be an epicardial pacer lead in place, however, no pacemaker device is noted at this time.  Surgical clips project over the axillary regions bilaterally.  Lung volumes are low.  There are bibasilar opacities (left greater than right), favored to predominately reflects postoperative subsegmental atelectasis.  Cephalization of the pulmonary vasculature with slight indistinctness of interstitial markings, suggesting mild interstitial pulmonary edema.  Linear opacity in the right apex likely represents subsegmental atelectasis.  Mild enlargement of the cardiopericardial silhouette which is within normal limits in a postoperative patient. The patient is rotated to the right on today's exam, resulting in distortion of the mediastinal contours and reduced diagnostic sensitivity and specificity for mediastinal pathology.  IMPRESSION: 1.  Postoperative changes and support apparatus, as above. 2.  Low lung volumes with areas of postoperative atelectasis in the lungs bilaterally, as above. 3.  Mild interstitial pulmonary edema.   Original Report Authenticated By: [name], M.D.     Assessment/Plan: S/P Procedure(s) (LRB): CORONARY ARTERY BYPASS GRAFTING (CABG) (N/A) MAZE (N/A) INTRAOPERATIVE TRANSESOPHAGEAL ECHOCARDIOGRAM (N/A) EPICARDIAL PACING LEAD PLACEMENT (N/A) Mobilize Diuresis Diabetes control d/c tubes/lines See progression orders Expected Acute  Blood - loss Anemia    [name] 09/29/2012 7:43 AM

## 2012-09-30 ENCOUNTER — Inpatient Hospital Stay (HOSPITAL_COMMUNITY): Payer: BC Managed Care – PPO

## 2012-09-30 LAB — TYPE AND SCREEN
ABO/RH(D): A POS
Antibody Screen: NEGATIVE
Unit division: 0
Unit division: 0
Unit division: 0
Unit division: 0
Unit division: 0
Unit division: 0

## 2012-09-30 LAB — GLUCOSE, CAPILLARY
Glucose-Capillary: 106 mg/dL — ABNORMAL HIGH (ref 70–99)
Glucose-Capillary: 112 mg/dL — ABNORMAL HIGH (ref 70–99)
Glucose-Capillary: 92 mg/dL (ref 70–99)
Glucose-Capillary: 94 mg/dL (ref 70–99)
Glucose-Capillary: 93 mg/dL (ref 70–99)
Glucose-Capillary: 104 mg/dL — ABNORMAL HIGH (ref 70–99)
Glucose-Capillary: 172 mg/dL — ABNORMAL HIGH (ref 70–99)
Glucose-Capillary: 172 mg/dL — ABNORMAL HIGH (ref 70–99)
Glucose-Capillary: 166 mg/dL — ABNORMAL HIGH (ref 70–99)

## 2012-09-30 LAB — BASIC METABOLIC PANEL
BUN: 9 mg/dL (ref 6–23)
CO2: 27 mEq/L (ref 19–32)
Calcium: 7.7 mg/dL — ABNORMAL LOW (ref 8.4–10.5)
Chloride: 100 mEq/L (ref 96–112)
Creatinine, Ser: 0.44 mg/dL — ABNORMAL LOW (ref 0.50–1.10)
GFR calc Af Amer: >90 mL/min (ref >90)
GFR calc non Af Amer: >90 mL/min (ref >90)
Glucose, Bld: 98 mg/dL (ref 70–99)
Potassium: 3.9 mEq/L (ref 3.5–5.1)
Sodium: 136 mEq/L (ref 135–145)

## 2012-09-30 LAB — CBC
HCT: 36.6 % (ref 36.0–46.0)
Hemoglobin: 12.7 g/dL (ref 12.0–15.0)
MCH: 31.6 pg (ref 26.0–34.0)
MCHC: 34.7 g/dL (ref 30.0–36.0)
MCV: 91 fL (ref 78.0–100.0)
Platelets: 171 10*3/uL (ref 150–400)
RBC: 4.02 MIL/uL (ref 3.87–5.11)
RDW: 13.4 % (ref 11.5–15.5)
WBC: 16.7 10*3/uL — ABNORMAL HIGH (ref 4.0–10.5)

## 2012-09-30 MED ORDER — POTASSIUM CHLORIDE CRYS ER 20 MEQ PO TBCR
20.0000 meq | EXTENDED_RELEASE_TABLET | Freq: Every day | ORAL | Status: AC
  Administered 2012-09-30 – 2012-10-02 (×3): 20 meq via ORAL
  Filled 2012-09-30 (×3): qty 1

## 2012-09-30 MED ORDER — METFORMIN HCL 500 MG PO TABS
1000.0000 mg | ORAL_TABLET | Freq: Two times a day (BID) | ORAL | Status: DC
  Administered 2012-09-30 – 2012-10-03 (×6): 1000 mg via ORAL
  Filled 2012-09-30 (×9): qty 2

## 2012-09-30 MED ORDER — FUROSEMIDE 40 MG PO TABS
40.0000 mg | ORAL_TABLET | Freq: Every day | ORAL | Status: AC
  Administered 2012-09-30 – 2012-10-02 (×3): 40 mg via ORAL
  Filled 2012-09-30 (×4): qty 1

## 2012-09-30 MED ORDER — INSULIN DETEMIR 100 UNIT/ML ~~LOC~~ SOLN
10.0000 [IU] | Freq: Every day | SUBCUTANEOUS | Status: DC
  Administered 2012-09-30: 10 [IU] via SUBCUTANEOUS
  Filled 2012-09-30 (×2): qty 0.1

## 2012-09-30 MED ORDER — TRAMADOL HCL 50 MG PO TABS: 50.0000 mg | ORAL_TABLET | ORAL | Status: DC | PRN

## 2012-09-30 MED ORDER — BISACODYL 10 MG RE SUPP: 10.0000 mg | Freq: Every day | RECTAL | Status: DC | PRN

## 2012-09-30 MED ORDER — DOCUSATE SODIUM 100 MG PO CAPS
200.0000 mg | ORAL_CAPSULE | Freq: Every day | ORAL | Status: DC
  Administered 2012-09-30: 200 mg via ORAL
  Filled 2012-09-30 (×4): qty 2

## 2012-09-30 MED ORDER — SODIUM CHLORIDE 0.9 % IJ SOLN: 3.0000 mL | INTRAMUSCULAR | Status: DC | PRN

## 2012-09-30 MED ORDER — GLIMEPIRIDE 4 MG PO TABS
4.0000 mg | ORAL_TABLET | Freq: Two times a day (BID) | ORAL | Status: DC
  Administered 2012-09-30 – 2012-10-03 (×6): 4 mg via ORAL
  Filled 2012-09-30 (×8): qty 1

## 2012-09-30 MED ORDER — BISACODYL 5 MG PO TBEC: 10.0000 mg | DELAYED_RELEASE_TABLET | Freq: Every day | ORAL | Status: DC | PRN

## 2012-09-30 MED ORDER — ONDANSETRON HCL 4 MG/2ML IJ SOLN
INTRAMUSCULAR | Status: AC
  Administered 2012-09-30: 4 mg
  Filled 2012-09-30: qty 2

## 2012-09-30 MED ORDER — INSULIN ASPART 100 UNIT/ML ~~LOC~~ SOLN
0.0000 [IU] | Freq: Three times a day (TID) | SUBCUTANEOUS | Status: DC
  Administered 2012-09-30 (×3): 4 [IU] via SUBCUTANEOUS
  Administered 2012-10-01: 12 [IU] via SUBCUTANEOUS
  Administered 2012-10-01: 2 [IU] via SUBCUTANEOUS
  Administered 2012-10-01 – 2012-10-02 (×3): 4 [IU] via SUBCUTANEOUS
  Administered 2012-10-02: 8 [IU] via SUBCUTANEOUS
  Administered 2012-10-02 – 2012-10-03 (×3): 4 [IU] via SUBCUTANEOUS

## 2012-09-30 MED ORDER — SODIUM CHLORIDE 0.9 % IV SOLN: 250.0000 mL | INTRAVENOUS | Status: DC | PRN

## 2012-09-30 MED ORDER — MOVING RIGHT ALONG BOOK
Freq: Once | Status: AC
  Administered 2012-09-30: 11:00:00
  Filled 2012-09-30: qty 1

## 2012-09-30 MED ORDER — RIVAROXABAN 20 MG PO TABS
20.0000 mg | ORAL_TABLET | Freq: Every day | ORAL | Status: DC
  Filled 2012-09-30: qty 1

## 2012-09-30 MED ORDER — SODIUM CHLORIDE 0.9 % IJ SOLN
3.0000 mL | Freq: Two times a day (BID) | INTRAMUSCULAR | Status: DC
  Administered 2012-09-30 – 2012-10-02 (×4): 3 mL via INTRAVENOUS

## 2012-09-30 MED ORDER — ENOXAPARIN SODIUM 30 MG/0.3ML ~~LOC~~ SOLN: 30.0000 mg | SUBCUTANEOUS | Status: DC

## 2012-09-30 MED ORDER — ASPIRIN EC 81 MG PO TBEC
81.0000 mg | DELAYED_RELEASE_TABLET | Freq: Every day | ORAL | Status: DC
  Administered 2012-09-30 – 2012-10-02 (×3): 81 mg via ORAL
  Filled 2012-09-30 (×4): qty 1

## 2012-09-30 MED ORDER — PANTOPRAZOLE SODIUM 40 MG PO TBEC
40.0000 mg | DELAYED_RELEASE_TABLET | Freq: Every day | ORAL | Status: DC
  Administered 2012-10-01 – 2012-10-03 (×3): 40 mg via ORAL
  Filled 2012-09-30 (×3): qty 1

## 2012-09-30 MED ORDER — OXYCODONE HCL 5 MG PO TABS
5.0000 mg | ORAL_TABLET | ORAL | Status: DC | PRN
  Administered 2012-09-30: 10 mg via ORAL
  Administered 2012-10-01 – 2012-10-02 (×2): 5 mg via ORAL
  Filled 2012-09-30 (×2): qty 1
  Filled 2012-09-30: qty 2

## 2012-09-30 MED ORDER — CARVEDILOL 25 MG PO TABS
25.0000 mg | ORAL_TABLET | Freq: Two times a day (BID) | ORAL | Status: DC
  Administered 2012-09-30 – 2012-10-01 (×3): 25 mg via ORAL
  Filled 2012-09-30 (×7): qty 1

## 2012-09-30 NOTE — Progress Notes (Signed)
Patient ID: [name], female   DOB: 1950-04-27, 62 y.o.   MRN: [id number] [facility] DAILY ICU PROGRESS NOTE                   [address]            [name] [address]          [phone]   2 Days Post-Op Procedure(s) (LRB): CORONARY ARTERY BYPASS GRAFTING (CABG) (N/A) MAZE (N/A) INTRAOPERATIVE TRANSESOPHAGEAL ECHOCARDIOGRAM (N/A) EPICARDIAL PACING LEAD PLACEMENT (N/A)  Total Length of Stay:  LOS: 2 days   Subjective: Feels better this am, nausea improved  Objective: Vital signs in last 24 hours: Temp:  [97.6 F (36.4 C)-99.8 F (37.7 C)] 99.6 F (37.6 C) (07/23 0450) Pulse Rate:  [70-79] 79 (07/23 0700) Cardiac Rhythm:  [-] Normal sinus rhythm;Bundle branch block (07/22 2000) Resp:  [11-28] 18 (07/23 0700) BP: (95-141)/(49-71) 121/54 mmHg (07/23 0700) SpO2:  [95 %-100 %] 98 % (07/23 0700) Arterial Line BP: (95-144)/(49-72) 95/49 mmHg (07/22 1315) Weight:  [159 lb (72.122 kg)-170 lb 6.7 oz (77.3 kg)] 170 lb 6.7 oz (77.3 kg) (07/23 0500)  Filed Weights   09/27/12 1300 09/30/12 0200 09/30/12 0500  Weight: 159 lb (72.122 kg) 159 lb (72.122 kg) 170 lb 6.7 oz (77.3 kg)    Weight change:    Hemodynamic parameters for last 24 hours: PAP: (31-36)/(13-18) 32/13 mmHg CO:  [3.1 L/min-3.3 L/min] 3.1 L/min CI:  [1.8 L/min/m2-1.9 L/min/m2] 1.8 L/min/m2  Intake/Output from previous day: 07/22 0701 - 07/23 0700 In: 734.7 [I.V.:634.7; IV Piggyback:100] Out: 1585 [Urine:1475; Chest Tube:110]  Intake/Output this shift:    Current Meds: Scheduled Meds: . acetaminophen  1,000 mg Oral Q6H   Or  . acetaminophen (TYLENOL) oral liquid 160 mg/5 mL  975 mg Per Tube Q6H  . amiodarone  200 mg Oral Daily  . aspirin EC  325 mg Oral Daily   Or  . aspirin  324 mg Per Tube Daily  . atorvastatin  80 mg Oral Daily  . bisacodyl  10 mg Oral Daily   Or  . bisacodyl  10 mg Rectal Daily  . cefUROXime (ZINACEF)  IV  1.5 g Intravenous Q12H  . docusate sodium  200 mg Oral  Daily  . insulin aspart  0-24 Units Subcutaneous Q4H  . insulin detemir  20 Units Subcutaneous Q1200  . insulin regular  0-10 Units Intravenous TID WC  . losartan  50 mg Oral BID  . metoprolol tartrate  12.5 mg Oral BID   Or  . metoprolol tartrate  12.5 mg Per Tube BID  . pantoprazole  40 mg Oral Daily  . sodium chloride  3 mL Intravenous Q12H   Continuous Infusions: . sodium chloride 20 mL/hr (09/28/12 1345)  . sodium chloride 20 mL/hr at 09/28/12 1900  . sodium chloride    . dexmedetomidine Stopped (09/28/12 1600)  . DOPamine Stopped (09/28/12 1700)  . insulin (NOVOLIN-R) infusion Stopped (09/29/12 1400)  . lactated ringers 20 mL/hr at 09/29/12 0845  . nitroGLYCERIN Stopped (09/29/12 1200)  . phenylephrine (NEO-SYNEPHRINE) Adult infusion Stopped (09/28/12 1600)   PRN Meds:.metoprolol, midazolam, morphine injection, ondansetron (ZOFRAN) IV, oxyCODONE, sodium chloride  General appearance: alert, cooperative and no distress Neurologic: intact Heart: regular rate and rhythm, S1, S2 normal, no murmur, click, rub or gallop Lungs: diminished breath sounds bibasilar Abdomen: soft, non-tender; bowel sounds normal; no masses,  no organomegaly Extremities: extremities normal, atraumatic, no cyanosis or edema and Homans sign is negative,  no sign of DVT Wound: sternum stable  Lab Results: CBC: Recent Labs  09/29/12 1700 09/29/12 1706 09/30/12 0315  WBC 16.4*  --  16.7*  HGB 12.6 12.6 12.7  HCT 36.5 37.0 36.6  PLT 173  --  171   BMET:  Recent Labs  09/29/12 0435  09/29/12 1706 09/30/12 0315  NA 135  --  136 136  K 4.3  --  4.3 3.9  CL 104  --  99 100  CO2 24  --   --  27  GLUCOSE 109*  --  120* 98  BUN 6  --  7 9  CREATININE 0.38*  < > 0.70 0.44*  CALCIUM 7.5*  --   --  7.7*  < > = values in this interval not displayed.  PT/INR:  Recent Labs  09/28/12 1350  LABPROT 18.2*  INR 1.55*   Radiology: Dg Chest Port 1 View  09/30/2012   *RADIOLOGY REPORT*  Clinical  Data: Coronary artery disease.  Postop from CABG.  PORTABLE CHEST - 1 VIEW  Comparison: 09/29/2012  Findings: The Swan-Ganz catheter and left chest tube have been removed.  No pneumothorax identified.  Small bilateral pleural effusions and bibasilar atelectasis show mild worsening since prior study.  Cardiomegaly remains stable.  IMPRESSION: Mild worsening of bibasilar atelectasis and small bilateral pleural effusions.  No pneumothorax identified.   Original Report Authenticated By: Myles Rosenthal, M.D.   Dg Chest Portable 1 View In Am  09/29/2012   *RADIOLOGY REPORT*  Clinical Data: Post CABG.  PORTABLE CHEST - 1 VIEW  Comparison: 09/28/2012  Findings: Swan-Ganz catheter in the distal pulmonary artery region. Chest tubes are still present.  No evidence for a pneumothorax. Streaky densities in the lower lungs are suggestive for atelectasis.  Cannot exclude mild edema.  The heart size is stable. The endotracheal tube and nasogastric tube have been removed. Lucency in the left upper abdomen is consistent with stomach gas.  IMPRESSION: Removal of endotracheal tube and nasogastric tube.  Streaky densities in the lower lungs are most compatible with atelectasis but cannot exclude mild edema.  Negative for pneumothorax.   Original Report Authenticated By: Richarda Overlie, M.D.   Dg Chest Portable 1 View  09/28/2012   *RADIOLOGY REPORT*  Clinical Data: Status post CABG.  PORTABLE CHEST - 1 VIEW  Comparison: Chest x-ray 09/25/2012.  Findings: An endotracheal tube is in place with tip 1.6 cm above the carina. Right internal jugular central venous Cordis through which a Swan Ganz catheter has been passed into the distal pulmonic trunk.  Mediastinal drain and left-sided chest tube in position. Status post median sternotomy for CABG and left atrial appendage ligation (an atrial appendage ligation clip is noted).  There is what appears to be an epicardial pacer lead in place, however, no pacemaker device is noted at this time.   Surgical clips project over the axillary regions bilaterally.  Lung volumes are low.  There are bibasilar opacities (left greater than right), favored to predominately reflects postoperative subsegmental atelectasis.  Cephalization of the pulmonary vasculature with slight indistinctness of interstitial markings, suggesting mild interstitial pulmonary edema.  Linear opacity in the right apex likely represents subsegmental atelectasis.  Mild enlargement of the cardiopericardial silhouette which is within normal limits in a postoperative patient. The patient is rotated to the right on today's exam, resulting in distortion of the mediastinal contours and reduced diagnostic sensitivity and specificity for mediastinal pathology.  IMPRESSION: 1.  Postoperative changes and support apparatus, as above. 2.  Low lung volumes with areas of postoperative atelectasis in the lungs bilaterally, as above. 3.  Mild interstitial pulmonary edema.   Original Report Authenticated By: Trudie Reed, M.D.     Assessment/Plan: S/P Procedure(s) (LRB): CORONARY ARTERY BYPASS GRAFTING (CABG) (N/A) MAZE (N/A) INTRAOPERATIVE TRANSESOPHAGEAL ECHOCARDIOGRAM (N/A) EPICARDIAL PACING LEAD PLACEMENT (N/A) Mobilize Diuresis Diabetes control d/c tubes/lines Plan for transfer to step-down: see transfer orders     Boe Deans B 09/30/2012 8:05 AM

## 2012-09-30 NOTE — Progress Notes (Signed)
DAILY PROGRESS NOTE  Subjective:  Nauseated. Maintaining sinus rhythm after MAZE. No chest pain.  Objective:  Temp:  [97.6 F (36.4 C)-99.8 F (37.7 C)] 98.5 F (36.9 C) (07/23 0809) Pulse Rate:  [70-79] 76 (07/23 0800) Resp:  [11-28] 18 (07/23 0800) BP: (95-141)/(49-71) 122/62 mmHg (07/23 0800) SpO2:  [96 %-100 %] 99 % (07/23 0800) Arterial Line BP: (95-143)/(49-72) 95/49 mmHg (07/22 1315) Weight:  [159 lb (72.122 kg)-170 lb 6.7 oz (77.3 kg)] 170 lb 6.7 oz (77.3 kg) (07/23 0500) Weight change:   Intake/Output from previous day: 07/22 0701 - 07/23 0700 In: 754.7 [I.V.:654.7; IV Piggyback:100] Out: 1615 [Urine:1505; Chest Tube:110]  Intake/Output from this shift: Total I/O In: 20 [I.V.:20] Out: 50 [Urine:30; Chest Tube:20]  Medications: Current Facility-Administered Medications  Medication Dose Route Frequency Provider Last Rate Last Dose  . 0.9 %  sodium chloride infusion  250 mL Intravenous PRN Delight Ovens, MD      . amiodarone (PACERONE) tablet 200 mg  200 mg Oral Daily Delight Ovens, MD   200 mg at 09/29/12 1042  . aspirin EC tablet 325 mg  325 mg Oral Daily Wilmon Pali, PA-C   325 mg at 09/29/12 1043  . aspirin EC tablet 81 mg  81 mg Oral Daily Delight Ovens, MD      . atorvastatin (LIPITOR) tablet 80 mg  80 mg Oral Daily Wilmon Pali, PA-C   80 mg at 09/29/12 1043  . bisacodyl (DULCOLAX) EC tablet 10 mg  10 mg Oral Daily PRN Delight Ovens, MD       Or  . bisacodyl (DULCOLAX) suppository 10 mg  10 mg Rectal Daily PRN Delight Ovens, MD      . carvedilol (COREG) tablet 25 mg  25 mg Oral BID WC Delight Ovens, MD      . docusate sodium (COLACE) capsule 200 mg  200 mg Oral Daily Delight Ovens, MD      . furosemide (LASIX) tablet 40 mg  40 mg Oral Q breakfast Delight Ovens, MD      . glimepiride (AMARYL) tablet 4 mg  4 mg Oral BID AC Delight Ovens, MD      . insulin aspart (novoLOG) injection 0-24 Units  0-24 Units Subcutaneous  TID AC & HS Delight Ovens, MD      . insulin detemir (LEVEMIR) injection 10 Units  10 Units Subcutaneous Q1200 Delight Ovens, MD      . losartan (COZAAR) tablet 50 mg  50 mg Oral BID Delight Ovens, MD   50 mg at 09/30/12 0045  . metFORMIN (GLUCOPHAGE) tablet 1,000 mg  1,000 mg Oral BID WC Delight Ovens, MD      . moving right along book   Does not apply Once Delight Ovens, MD      . oxyCODONE (Oxy IR/ROXICODONE) immediate release tablet 5-10 mg  5-10 mg Oral Q3H PRN Delight Ovens, MD      . Melene Muller ON 10/01/2012] pantoprazole (PROTONIX) EC tablet 40 mg  40 mg Oral QAC breakfast Delight Ovens, MD      . potassium chloride SA (K-DUR,KLOR-CON) CR tablet 20 mEq  20 mEq Oral Daily Delight Ovens, MD      . Melene Muller ON 10/01/2012] Rivaroxaban (XARELTO) tablet 20 mg  20 mg Oral Q supper Delight Ovens, MD      . sodium chloride 0.9 % injection 3 mL  3 mL Intravenous  Q12H Delight Ovens, MD      . sodium chloride 0.9 % injection 3 mL  3 mL Intravenous PRN Delight Ovens, MD      . traMADol Janean Sark) tablet 50-100 mg  50-100 mg Oral Q4H PRN Delight Ovens, MD        Physical Exam: General appearance: alert and no distress Neck: no adenopathy, no carotid bruit, supple, symmetrical, trachea midline, thyroid not enlarged, symmetric, no tenderness/mass/nodules and right IJ TLCF Lungs: clear to auscultation bilaterally Heart: regular rate and rhythm, S1, S2 normal, no murmur, click, rub or gallop Abdomen: soft, non-tender; bowel sounds normal; no masses,  no organomegaly Extremities: edema trace UE/LE edmea Pulses: 2+ and symmetric Skin: pale, cool extremities Neurologic: Grossly normal  Lab Results: Results for orders placed during the hospital encounter of 09/28/12 (from the past 48 hour(s))  POCT I-STAT 4, (NA,K, GLUC, HGB,HCT)     Status: Abnormal   Collection Time    09/28/12  9:46 AM      Result Value Range   Sodium 140  135 - 145 mEq/L   Potassium 3.3 (*)  3.5 - 5.1 mEq/L   Glucose, Bld 248 (*) 70 - 99 mg/dL   HCT 09.8 (*) 11.9 - 14.7 %   Hemoglobin 8.8 (*) 12.0 - 15.0 g/dL  POCT I-STAT 4, (NA,K, GLUC, HGB,HCT)     Status: Abnormal   Collection Time    09/28/12 10:05 AM      Result Value Range   Sodium 134 (*) 135 - 145 mEq/L   Potassium 3.9  3.5 - 5.1 mEq/L   Glucose, Bld 231 (*) 70 - 99 mg/dL   HCT 82.9 (*) 56.2 - 13.0 %   Hemoglobin 8.5 (*) 12.0 - 15.0 g/dL  POCT I-STAT 3, BLOOD GAS (G3+)     Status: Abnormal   Collection Time    09/28/12 10:09 AM      Result Value Range   pH, Arterial 7.303 (*) 7.350 - 7.450   pCO2 arterial 47.9 (*) 35.0 - 45.0 mmHg   pO2, Arterial 338.0 (*) 80.0 - 100.0 mmHg   Bicarbonate 23.8  20.0 - 24.0 mEq/L   TCO2 25  0 - 100 mmol/L   O2 Saturation 100.0     Acid-base deficit 3.0 (*) 0.0 - 2.0 mmol/L   Sample type ARTERIAL    POCT I-STAT 4, (NA,K, GLUC, HGB,HCT)     Status: Abnormal   Collection Time    09/28/12 11:01 AM      Result Value Range   Sodium 139  135 - 145 mEq/L   Potassium 3.7  3.5 - 5.1 mEq/L   Glucose, Bld 211 (*) 70 - 99 mg/dL   HCT 86.5 (*) 78.4 - 69.6 %   Hemoglobin 9.5 (*) 12.0 - 15.0 g/dL  HEMOGLOBIN AND HEMATOCRIT, BLOOD     Status: Abnormal   Collection Time    09/28/12 11:16 AM      Result Value Range   Hemoglobin 9.6 (*) 12.0 - 15.0 g/dL   HCT 29.5 (*) 28.4 - 13.2 %  PLATELET COUNT     Status: None   Collection Time    09/28/12 11:16 AM      Result Value Range   Platelets 163  150 - 400 K/uL  POCT I-STAT 4, (NA,K, GLUC, HGB,HCT)     Status: Abnormal   Collection Time    09/28/12 12:01 PM      Result Value Range   Sodium 140  135 - 145 mEq/L   Potassium 3.9  3.5 - 5.1 mEq/L   Glucose, Bld 227 (*) 70 - 99 mg/dL   HCT 16.1 (*) 09.6 - 04.5 %   Hemoglobin 9.5 (*) 12.0 - 15.0 g/dL  POCT I-STAT 4, (NA,K, GLUC, HGB,HCT)     Status: Abnormal   Collection Time    09/28/12 12:36 PM      Result Value Range   Sodium 140  135 - 145 mEq/L   Potassium 3.3 (*) 3.5 - 5.1 mEq/L     Glucose, Bld 238 (*) 70 - 99 mg/dL   HCT 40.9 (*) 81.1 - 91.4 %   Hemoglobin 10.9 (*) 12.0 - 15.0 g/dL  POCT I-STAT 3, BLOOD GAS (G3+)     Status: Abnormal   Collection Time    09/28/12 12:40 PM      Result Value Range   pH, Arterial 7.349 (*) 7.350 - 7.450   pCO2 arterial 44.5  35.0 - 45.0 mmHg   pO2, Arterial 227.0 (*) 80.0 - 100.0 mmHg   Bicarbonate 24.5 (*) 20.0 - 24.0 mEq/L   TCO2 26  0 - 100 mmol/L   O2 Saturation 100.0     Acid-base deficit 1.0  0.0 - 2.0 mmol/L   Sample type ARTERIAL    CBC     Status: Abnormal   Collection Time    09/28/12  1:50 PM      Result Value Range   WBC 12.7 (*) 4.0 - 10.5 K/uL   RBC 4.02  3.87 - 5.11 MIL/uL   Hemoglobin 12.3  12.0 - 15.0 g/dL   HCT 78.2 (*) 95.6 - 21.3 %   MCV 88.8  78.0 - 100.0 fL   MCH 30.6  26.0 - 34.0 pg   MCHC 34.5  30.0 - 36.0 g/dL   RDW 08.6  57.8 - 46.9 %   Platelets 143 (*) 150 - 400 K/uL  PROTIME-INR     Status: Abnormal   Collection Time    09/28/12  1:50 PM      Result Value Range   Prothrombin Time 18.2 (*) 11.6 - 15.2 seconds   INR 1.55 (*) 0.00 - 1.49  APTT     Status: None   Collection Time    09/28/12  1:50 PM      Result Value Range   aPTT 32  24 - 37 seconds  POCT I-STAT 4, (NA,K, GLUC, HGB,HCT)     Status: Abnormal   Collection Time    09/28/12  1:53 PM      Result Value Range   Sodium 142  135 - 145 mEq/L   Potassium 3.0 (*) 3.5 - 5.1 mEq/L   Glucose, Bld 183 (*) 70 - 99 mg/dL   HCT 62.9  52.8 - 41.3 %   Hemoglobin 12.2  12.0 - 15.0 g/dL  POCT I-STAT 3, BLOOD GAS (G3+)     Status: Abnormal   Collection Time    09/28/12  1:57 PM      Result Value Range   pH, Arterial 7.301 (*) 7.350 - 7.450   pCO2 arterial 50.8 (*) 35.0 - 45.0 mmHg   pO2, Arterial 65.0 (*) 80.0 - 100.0 mmHg   Bicarbonate 25.2 (*) 20.0 - 24.0 mEq/L   TCO2 27  0 - 100 mmol/L   O2 Saturation 91.0     Acid-base deficit 2.0  0.0 - 2.0 mmol/L   Patient temperature 36.4 C     Sample type ARTERIAL  GLUCOSE, CAPILLARY      Status: Abnormal   Collection Time    09/28/12  2:57 PM      Result Value Range   Glucose-Capillary 131 (*) 70 - 99 mg/dL  POCT I-STAT 3, BLOOD GAS (G3+)     Status: Abnormal   Collection Time    09/28/12  3:46 PM      Result Value Range   pH, Arterial 7.415  7.350 - 7.450   pCO2 arterial 34.2 (*) 35.0 - 45.0 mmHg   pO2, Arterial 86.0  80.0 - 100.0 mmHg   Bicarbonate 22.0  20.0 - 24.0 mEq/L   TCO2 23  0 - 100 mmol/L   O2 Saturation 97.0     Acid-base deficit 2.0  0.0 - 2.0 mmol/L   Patient temperature 36.6 C     Sample type ARTERIAL    GLUCOSE, CAPILLARY     Status: Abnormal   Collection Time    09/28/12  3:57 PM      Result Value Range   Glucose-Capillary 111 (*) 70 - 99 mg/dL  GLUCOSE, CAPILLARY     Status: None   Collection Time    09/28/12  5:01 PM      Result Value Range   Glucose-Capillary 99  70 - 99 mg/dL  POCT I-STAT 3, BLOOD GAS (G3+)     Status: Abnormal   Collection Time    09/28/12  5:02 PM      Result Value Range   pH, Arterial 7.341 (*) 7.350 - 7.450   pCO2 arterial 43.4  35.0 - 45.0 mmHg   pO2, Arterial 89.0  80.0 - 100.0 mmHg   Bicarbonate 23.4  20.0 - 24.0 mEq/L   TCO2 25  0 - 100 mmol/L   O2 Saturation 96.0     Acid-base deficit 2.0  0.0 - 2.0 mmol/L   Patient temperature 37.1 C     Sample type ARTERIAL    GLUCOSE, CAPILLARY     Status: Abnormal   Collection Time    09/28/12  6:06 PM      Result Value Range   Glucose-Capillary 118 (*) 70 - 99 mg/dL  POCT I-STAT 3, BLOOD GAS (G3+)     Status: Abnormal   Collection Time    09/28/12  6:09 PM      Result Value Range   pH, Arterial 7.351  7.350 - 7.450   pCO2 arterial 43.1  35.0 - 45.0 mmHg   pO2, Arterial 106.0 (*) 80.0 - 100.0 mmHg   Bicarbonate 23.7  20.0 - 24.0 mEq/L   TCO2 25  0 - 100 mmol/L   O2 Saturation 98.0     Acid-base deficit 2.0  0.0 - 2.0 mmol/L   Patient temperature 37.3 C     Sample type ARTERIAL    GLUCOSE, CAPILLARY     Status: Abnormal   Collection Time    09/28/12  7:10  PM      Result Value Range   Glucose-Capillary 117 (*) 70 - 99 mg/dL  MAGNESIUM     Status: Abnormal   Collection Time    09/28/12  8:00 PM      Result Value Range   Magnesium 2.6 (*) 1.5 - 2.5 mg/dL  CREATININE, SERUM     Status: Abnormal   Collection Time    09/28/12  8:00 PM      Result Value Range   Creatinine, Ser 0.42 (*) 0.50 - 1.10 mg/dL   GFR calc non Af Amer >  90  >90 mL/min   GFR calc Af Amer >90  >90 mL/min   Comment:            The eGFR has been calculated     using the CKD EPI equation.     This calculation has not been     validated in all clinical     situations.     eGFR's persistently     <90 mL/min signify     possible Chronic Kidney Disease.  CBC     Status: Abnormal   Collection Time    09/28/12  8:00 PM      Result Value Range   WBC 15.8 (*) 4.0 - 10.5 K/uL   RBC 3.96  3.87 - 5.11 MIL/uL   Hemoglobin 12.3  12.0 - 15.0 g/dL   HCT 47.8 (*) 29.5 - 62.1 %   MCV 88.1  78.0 - 100.0 fL   MCH 31.1  26.0 - 34.0 pg   MCHC 35.2  30.0 - 36.0 g/dL   RDW 30.8  65.7 - 84.6 %   Platelets 211  150 - 400 K/uL  POCT I-STAT, CHEM 8     Status: Abnormal   Collection Time    09/28/12  8:02 PM      Result Value Range   Sodium 140  135 - 145 mEq/L   Potassium 3.6  3.5 - 5.1 mEq/L   Chloride 103  96 - 112 mEq/L   BUN 5 (*) 6 - 23 mg/dL   Creatinine, Ser 9.62  0.50 - 1.10 mg/dL   Glucose, Bld 952 (*) 70 - 99 mg/dL   Calcium, Ion 8.41  3.24 - 1.30 mmol/L   TCO2 23  0 - 100 mmol/L   Hemoglobin 12.2  12.0 - 15.0 g/dL   HCT 40.1  02.7 - 25.3 %  GLUCOSE, CAPILLARY     Status: None   Collection Time    09/28/12  8:05 PM      Result Value Range   Glucose-Capillary 89  70 - 99 mg/dL  GLUCOSE, CAPILLARY     Status: Abnormal   Collection Time    09/28/12  9:06 PM      Result Value Range   Glucose-Capillary 164 (*) 70 - 99 mg/dL  GLUCOSE, CAPILLARY     Status: Abnormal   Collection Time    09/28/12 10:05 PM      Result Value Range   Glucose-Capillary 146 (*) 70 - 99  mg/dL  GLUCOSE, CAPILLARY     Status: Abnormal   Collection Time    09/28/12 11:04 PM      Result Value Range   Glucose-Capillary 146 (*) 70 - 99 mg/dL  GLUCOSE, CAPILLARY     Status: Abnormal   Collection Time    09/28/12 11:57 PM      Result Value Range   Glucose-Capillary 129 (*) 70 - 99 mg/dL  GLUCOSE, CAPILLARY     Status: Abnormal   Collection Time    09/29/12 12:52 AM      Result Value Range   Glucose-Capillary 126 (*) 70 - 99 mg/dL  GLUCOSE, CAPILLARY     Status: Abnormal   Collection Time    09/29/12  2:03 AM      Result Value Range   Glucose-Capillary 102 (*) 70 - 99 mg/dL  GLUCOSE, CAPILLARY     Status: Abnormal   Collection Time    09/29/12  3:03 AM      Result Value Range  Glucose-Capillary 108 (*) 70 - 99 mg/dL  GLUCOSE, CAPILLARY     Status: None   Collection Time    09/29/12  4:07 AM      Result Value Range   Glucose-Capillary 99  70 - 99 mg/dL  CBC     Status: Abnormal   Collection Time    09/29/12  4:35 AM      Result Value Range   WBC 14.9 (*) 4.0 - 10.5 K/uL   RBC 3.84 (*) 3.87 - 5.11 MIL/uL   Hemoglobin 11.8 (*) 12.0 - 15.0 g/dL   HCT 40.9 (*) 81.1 - 91.4 %   MCV 88.0  78.0 - 100.0 fL   MCH 30.7  26.0 - 34.0 pg   MCHC 34.9  30.0 - 36.0 g/dL   RDW 78.2  95.6 - 21.3 %   Platelets 180  150 - 400 K/uL  BASIC METABOLIC PANEL     Status: Abnormal   Collection Time    09/29/12  4:35 AM      Result Value Range   Sodium 135  135 - 145 mEq/L   Potassium 4.3  3.5 - 5.1 mEq/L   Chloride 104  96 - 112 mEq/L   CO2 24  19 - 32 mEq/L   Glucose, Bld 109 (*) 70 - 99 mg/dL   BUN 6  6 - 23 mg/dL   Creatinine, Ser 0.86 (*) 0.50 - 1.10 mg/dL   Comment: DELTA CHECK NOTED   Calcium 7.5 (*) 8.4 - 10.5 mg/dL   GFR calc non Af Amer >90  >90 mL/min   GFR calc Af Amer >90  >90 mL/min   Comment:            The eGFR has been calculated     using the CKD EPI equation.     This calculation has not been     validated in all clinical     situations.     eGFR's  persistently     <90 mL/min signify     possible Chronic Kidney Disease.  MAGNESIUM     Status: None   Collection Time    09/29/12  4:35 AM      Result Value Range   Magnesium 1.9  1.5 - 2.5 mg/dL  GLUCOSE, CAPILLARY     Status: Abnormal   Collection Time    09/29/12  5:11 AM      Result Value Range   Glucose-Capillary 110 (*) 70 - 99 mg/dL  GLUCOSE, CAPILLARY     Status: Abnormal   Collection Time    09/29/12  6:03 AM      Result Value Range   Glucose-Capillary 111 (*) 70 - 99 mg/dL  GLUCOSE, CAPILLARY     Status: Abnormal   Collection Time    09/29/12  7:08 AM      Result Value Range   Glucose-Capillary 111 (*) 70 - 99 mg/dL  GLUCOSE, CAPILLARY     Status: Abnormal   Collection Time    09/29/12  8:23 AM      Result Value Range   Glucose-Capillary 101 (*) 70 - 99 mg/dL  GLUCOSE, CAPILLARY     Status: None   Collection Time    09/29/12  8:53 AM      Result Value Range   Glucose-Capillary 95  70 - 99 mg/dL  GLUCOSE, CAPILLARY     Status: None   Collection Time    09/29/12 10:04 AM      Result  Value Range   Glucose-Capillary 98  70 - 99 mg/dL  GLUCOSE, CAPILLARY     Status: None   Collection Time    09/29/12 11:09 AM      Result Value Range   Glucose-Capillary 97  70 - 99 mg/dL  GLUCOSE, CAPILLARY     Status: Abnormal   Collection Time    09/29/12 11:58 AM      Result Value Range   Glucose-Capillary 118 (*) 70 - 99 mg/dL  GLUCOSE, CAPILLARY     Status: Abnormal   Collection Time    09/29/12  1:09 PM      Result Value Range   Glucose-Capillary 106 (*) 70 - 99 mg/dL  GLUCOSE, CAPILLARY     Status: Abnormal   Collection Time    09/29/12  1:55 PM      Result Value Range   Glucose-Capillary 112 (*) 70 - 99 mg/dL  GLUCOSE, CAPILLARY     Status: None   Collection Time    09/29/12  2:23 PM      Result Value Range   Glucose-Capillary 92  70 - 99 mg/dL  GLUCOSE, CAPILLARY     Status: None   Collection Time    09/29/12  3:12 PM      Result Value Range    Glucose-Capillary 94  70 - 99 mg/dL  MAGNESIUM     Status: None   Collection Time    09/29/12  5:00 PM      Result Value Range   Magnesium 1.9  1.5 - 2.5 mg/dL  CBC     Status: Abnormal   Collection Time    09/29/12  5:00 PM      Result Value Range   WBC 16.4 (*) 4.0 - 10.5 K/uL   RBC 4.06  3.87 - 5.11 MIL/uL   Hemoglobin 12.6  12.0 - 15.0 g/dL   HCT 45.4  09.8 - 11.9 %   MCV 89.9  78.0 - 100.0 fL   MCH 31.0  26.0 - 34.0 pg   MCHC 34.5  30.0 - 36.0 g/dL   RDW 14.7  82.9 - 56.2 %   Platelets 173  150 - 400 K/uL  CREATININE, SERUM     Status: Abnormal   Collection Time    09/29/12  5:00 PM      Result Value Range   Creatinine, Ser 0.41 (*) 0.50 - 1.10 mg/dL   GFR calc non Af Amer >90  >90 mL/min   GFR calc Af Amer >90  >90 mL/min   Comment:            The eGFR has been calculated     using the CKD EPI equation.     This calculation has not been     validated in all clinical     situations.     eGFR's persistently     <90 mL/min signify     possible Chronic Kidney Disease.  POCT I-STAT, CHEM 8     Status: Abnormal   Collection Time    09/29/12  5:06 PM      Result Value Range   Sodium 136  135 - 145 mEq/L   Potassium 4.3  3.5 - 5.1 mEq/L   Chloride 99  96 - 112 mEq/L   BUN 7  6 - 23 mg/dL   Creatinine, Ser 1.30  0.50 - 1.10 mg/dL   Glucose, Bld 865 (*) 70 - 99 mg/dL   Calcium, Ion 7.84 (*) 1.13 -  1.30 mmol/L   TCO2 24  0 - 100 mmol/L   Hemoglobin 12.6  12.0 - 15.0 g/dL   HCT 16.1  09.6 - 04.5 %  GLUCOSE, CAPILLARY     Status: None   Collection Time    09/29/12 11:49 PM      Result Value Range   Glucose-Capillary 98  70 - 99 mg/dL   Comment 1 Documented in Chart     Comment 2 Notify RN    BASIC METABOLIC PANEL     Status: Abnormal   Collection Time    09/30/12  3:15 AM      Result Value Range   Sodium 136  135 - 145 mEq/L   Potassium 3.9  3.5 - 5.1 mEq/L   Chloride 100  96 - 112 mEq/L   CO2 27  19 - 32 mEq/L   Glucose, Bld 98  70 - 99 mg/dL   BUN 9  6 - 23  mg/dL   Creatinine, Ser 4.09 (*) 0.50 - 1.10 mg/dL   Comment: DELTA CHECK NOTED   Calcium 7.7 (*) 8.4 - 10.5 mg/dL   GFR calc non Af Amer >90  >90 mL/min   GFR calc Af Amer >90  >90 mL/min   Comment:            The eGFR has been calculated     using the CKD EPI equation.     This calculation has not been     validated in all clinical     situations.     eGFR's persistently     <90 mL/min signify     possible Chronic Kidney Disease.  CBC     Status: Abnormal   Collection Time    09/30/12  3:15 AM      Result Value Range   WBC 16.7 (*) 4.0 - 10.5 K/uL   RBC 4.02  3.87 - 5.11 MIL/uL   Hemoglobin 12.7  12.0 - 15.0 g/dL   HCT 81.1  91.4 - 78.2 %   MCV 91.0  78.0 - 100.0 fL   MCH 31.6  26.0 - 34.0 pg   MCHC 34.7  30.0 - 36.0 g/dL   RDW 95.6  21.3 - 08.6 %   Platelets 171  150 - 400 K/uL  GLUCOSE, CAPILLARY     Status: None   Collection Time    09/30/12  4:46 AM      Result Value Range   Glucose-Capillary 93  70 - 99 mg/dL   Comment 1 Documented in Chart     Comment 2 Notify RN      Imaging: Dg Chest Port 1 View  09/30/2012   *RADIOLOGY REPORT*  Clinical Data: Coronary artery disease.  Postop from CABG.  PORTABLE CHEST - 1 VIEW  Comparison: 09/29/2012  Findings: The Swan-Ganz catheter and left chest tube have been removed.  No pneumothorax identified.  Small bilateral pleural effusions and bibasilar atelectasis show mild worsening since prior study.  Cardiomegaly remains stable.  IMPRESSION: Mild worsening of bibasilar atelectasis and small bilateral pleural effusions.  No pneumothorax identified.   Original Report Authenticated By: Myles Rosenthal, M.D.   Dg Chest Portable 1 View In Am  09/29/2012   *RADIOLOGY REPORT*  Clinical Data: Post CABG.  PORTABLE CHEST - 1 VIEW  Comparison: 09/28/2012  Findings: Swan-Ganz catheter in the distal pulmonary artery region. Chest tubes are still present.  No evidence for a pneumothorax. Streaky densities in the lower lungs are suggestive for  atelectasis.  Cannot exclude mild edema.  The heart size is stable. The endotracheal tube and nasogastric tube have been removed. Lucency in the left upper abdomen is consistent with stomach gas.  IMPRESSION: Removal of endotracheal tube and nasogastric tube.  Streaky densities in the lower lungs are most compatible with atelectasis but cannot exclude mild edema.  Negative for pneumothorax.   Original Report Authenticated By: Richarda Overlie, M.D.   Dg Chest Portable 1 View  09/28/2012   *RADIOLOGY REPORT*  Clinical Data: Status post CABG.  PORTABLE CHEST - 1 VIEW  Comparison: Chest x-ray 09/25/2012.  Findings: An endotracheal tube is in place with tip 1.6 cm above the carina. Right internal jugular central venous Cordis through which a Swan Ganz catheter has been passed into the distal pulmonic trunk.  Mediastinal drain and left-sided chest tube in position. Status post median sternotomy for CABG and left atrial appendage ligation (an atrial appendage ligation clip is noted).  There is what appears to be an epicardial pacer lead in place, however, no pacemaker device is noted at this time.  Surgical clips project over the axillary regions bilaterally.  Lung volumes are low.  There are bibasilar opacities (left greater than right), favored to predominately reflects postoperative subsegmental atelectasis.  Cephalization of the pulmonary vasculature with slight indistinctness of interstitial markings, suggesting mild interstitial pulmonary edema.  Linear opacity in the right apex likely represents subsegmental atelectasis.  Mild enlargement of the cardiopericardial silhouette which is within normal limits in a postoperative patient. The patient is rotated to the right on today's exam, resulting in distortion of the mediastinal contours and reduced diagnostic sensitivity and specificity for mediastinal pathology.  IMPRESSION: 1.  Postoperative changes and support apparatus, as above. 2.  Low lung volumes with areas of  postoperative atelectasis in the lungs bilaterally, as above. 3.  Mild interstitial pulmonary edema.   Original Report Authenticated By: Trudie Reed, M.D.    Assessment:  1. Principal Problem: 2.   S/P CABG x 4: 09/28/12 (LIMA-LAD, SVG-OM1-OM2, SVG-Intermediate) 3. Active Problems: 4.   DIABETES MELLITUS TYPE 1-UNCONTROLLED 5.   Ischemic cardiomyopathy 6.   CAD (coronary artery disease) 7. Atrial fibrillation - maintaining sinus  Plan:  1. Doing well post-op. Maintaining sinus. Still with some upper and lower extremity edema. Agree with additional lasix. Looks like she is ready for floor transfer today. Nausea is improved.  Time Spent Directly with Patient:  15 minutes  Length of Stay:  LOS: 2 days   Chrystie Nose, MD, [facility] Attending Cardiologist [facility]  Gurkirat Basher C 09/30/2012, 9:18 AM

## 2012-09-30 NOTE — Progress Notes (Signed)
Report called to 2000.  Pt's VS WNL on time of transfer.  Pt ambulated part of the way to her new room and then was taken the remainder in a wheelchair.  Nurse met pt at bedside.  Family was advised of transfer and new room #.

## 2012-10-01 ENCOUNTER — Inpatient Hospital Stay (HOSPITAL_COMMUNITY): Payer: BC Managed Care – PPO

## 2012-10-01 LAB — BASIC METABOLIC PANEL
BUN: 14 mg/dL (ref 6–23)
CO2: 27 mEq/L (ref 19–32)
Calcium: 8.1 mg/dL — ABNORMAL LOW (ref 8.4–10.5)
Chloride: 97 mEq/L (ref 96–112)
Creatinine, Ser: 0.56 mg/dL (ref 0.50–1.10)
GFR calc Af Amer: >90 mL/min (ref >90)
GFR calc non Af Amer: >90 mL/min (ref >90)
Glucose, Bld: 186 mg/dL — ABNORMAL HIGH (ref 70–99)
Potassium: 4.7 mEq/L (ref 3.5–5.1)
Sodium: 131 mEq/L — ABNORMAL LOW (ref 135–145)

## 2012-10-01 LAB — CBC
HCT: 36.8 % (ref 36.0–46.0)
Hemoglobin: 12.7 g/dL (ref 12.0–15.0)
MCH: 31.4 pg (ref 26.0–34.0)
MCHC: 34.5 g/dL (ref 30.0–36.0)
MCV: 91.1 fL (ref 78.0–100.0)
Platelets: 182 10*3/uL (ref 150–400)
RBC: 4.04 MIL/uL (ref 3.87–5.11)
RDW: 13.3 % (ref 11.5–15.5)
WBC: 14.7 10*3/uL — ABNORMAL HIGH (ref 4.0–10.5)

## 2012-10-01 LAB — GLUCOSE, CAPILLARY
Glucose-Capillary: 291 mg/dL — ABNORMAL HIGH (ref 70–99)
Glucose-Capillary: 172 mg/dL — ABNORMAL HIGH (ref 70–99)
Glucose-Capillary: 187 mg/dL — ABNORMAL HIGH (ref 70–99)

## 2012-10-01 MED ORDER — RIVAROXABAN 20 MG PO TABS
20.0000 mg | ORAL_TABLET | Freq: Every day | ORAL | Status: DC
  Administered 2012-10-02: 20 mg via ORAL
  Filled 2012-10-01 (×2): qty 1

## 2012-10-01 MED ORDER — LOSARTAN POTASSIUM 25 MG PO TABS
25.0000 mg | ORAL_TABLET | Freq: Two times a day (BID) | ORAL | Status: DC
  Administered 2012-10-01 – 2012-10-02 (×3): 25 mg via ORAL
  Filled 2012-10-01 (×5): qty 1

## 2012-10-01 MED ORDER — CARVEDILOL 12.5 MG PO TABS
12.5000 mg | ORAL_TABLET | Freq: Two times a day (BID) | ORAL | Status: DC
  Administered 2012-10-01 – 2012-10-03 (×4): 12.5 mg via ORAL
  Filled 2012-10-01 (×6): qty 1

## 2012-10-01 NOTE — Progress Notes (Addendum)
[address]       [name] [address]             [phone]          3 Days Post-Op Procedure(s) (LRB): CORONARY ARTERY BYPASS GRAFTING (CABG) (N/A) MAZE (N/A) INTRAOPERATIVE TRANSESOPHAGEAL ECHOCARDIOGRAM (N/A) EPICARDIAL PACING LEAD PLACEMENT (N/A)  Subjective: Feels much better today.  Nausea significantly improved and appetite better.  No new complaints.   Objective: Vital signs in last 24 hours: Patient Vitals for the past 24 hrs:  BP Temp Temp src Pulse Resp SpO2 Weight  10/01/12 0422 121/77 mmHg 98.6 F (37 C) Oral 73 18 94 % 164 lb 4.8 oz (74.526 kg)  09/30/12 2200 114/63 mmHg - - - - - -  09/30/12 1939 96/57 mmHg 98.8 F (37.1 C) Oral 74 18 95 % -  09/30/12 1732 121/64 mmHg - - 74 - - -  09/30/12 1608 118/71 mmHg 98.6 F (37 C) Oral 72 19 97 % -  09/30/12 1400 102/57 mmHg - - 73 21 93 % -  09/30/12 1300 96/66 mmHg - - 74 14 89 % -  09/30/12 1210 - 98 F (36.7 C) Oral - - - -  09/30/12 1200 93/51 mmHg - - 77 21 91 % -  09/30/12 1100 122/62 mmHg - - 75 14 93 % -  09/30/12 1000 117/55 mmHg - - 74 14 95 % -  09/30/12 0900 127/68 mmHg - - 74 15 96 % -   Current Weight  10/01/12 164 lb 4.8 oz (74.526 kg)  PRE-OPERATIVE WEIGHT: 72 kg    Intake/Output from previous day: 07/23 0701 - 07/24 0700 In: 500 [P.O.:480; I.V.:20] Out: 1260 [Urine:1240; Chest Tube:20]  CBGs 172-166-   PHYSICAL EXAM:  Heart: RRR Lungs: Clear Wound: Clean and dry Extremities: No significant LE edema    Lab Results: CBC: Recent Labs  09/30/12 0315 10/01/12 0650  WBC 16.7* 14.7*  HGB 12.7 12.7  HCT 36.6 36.8  PLT 171 182   BMET:  Recent Labs  09/29/12 0435  09/29/12 1706 09/30/12 0315  NA 135  --  136 136  K 4.3  --  4.3 3.9  CL 104  --  99 100  CO2 24  --   --  27  GLUCOSE 109*  --  120* 98  BUN 6  --  7 9  CREATININE 0.38*  < > 0.70 0.44*  CALCIUM 7.5*  --   --  7.7*  < > = values in this interval not displayed.  PT/INR:    Recent Labs  09/28/12 1350  LABPROT 18.2*  INR 1.55*   CXR: Findings: The previously noted right IJ catheter has been removed. Epicardial pacing wire with lead projecting over the left ventricular apex, attached to any pacemaker device at this time. Status post median sternotomy for CABG. Lung volumes are normal. Bibasilar opacities favored to represent some postoperative atelectasis with overlying small bilateral pleural effusions (left greater than right), slightly improved. Mild cephalization of the pulmonary vasculature, without frank pulmonary edema. Mild enlargement of the cardiopericardial silhouette is unchanged. The patient is rotated to the right on today's exam, resulting in distortion of the mediastinal contours and reduced diagnostic sensitivity and specificity for mediastinal pathology. The surgical clips in the lateral aspects of the breast bilaterally in the lower left axilla.  IMPRESSION:  1. Support apparatus and postoperative changes, as above.  2. Slight improvement in lung volumes with increasing bibasilar  aeration, likely to reflect resolving areas of postoperative atelectasis and slight decrease in small bilateral pleural effusions.  3. Pulmonary venous congestion, without frank pulmonary edema.    Assessment/Plan: S/P Procedure(s) (LRB): CORONARY ARTERY BYPASS GRAFTING (CABG) (N/A) MAZE (N/A) INTRAOPERATIVE TRANSESOPHAGEAL ECHOCARDIOGRAM (N/A) EPICARDIAL PACING LEAD PLACEMENT (N/A) CV- maintaining SR, BPs generally stable.  Continue Amio, Coreg, Cozaar, Xarelto. DM- sugars stable on home meds.  Will d/c Levemir. Vol overload- diurese. Continue pulm toilet/IS, wean O2. CRPI, ambulation.   LOS: 3 days    COLLINS,GINA H 10/01/2012  Starting xarelto today D/c pacing wires this am I have seen and examined [name] and agree with the above assessment  and plan.  Delight Ovens MD Beeper [phone] Office [phone] 10/01/2012 8:58 AM

## 2012-10-01 NOTE — Progress Notes (Signed)
CARDIAC REHAB PHASE I   PRE:  Rate/Rhythm: 78 SR    BP: sitting 104/53    SaO2: 93-96 2L, 92 RA  MODE:  Ambulation: 350 ft   POST:  Rate/Rhythm: 78 SR    BP: sitting 80/38, 82/39, 10 min later 83/39     SaO2: 97 2L  Pt used RW and 2L O2 with supervision assist. Thankful to be out of bed. Latter part of walk pt c/o weak and tired legs and feeling fatigued in general. Rest x1. To BR for BM after walk. When pt got in recliner after walk and BR, BP low (see above). Denied dizziness with walk or presently but did st her legs were weak walking. Sts she feels good in recliner. Notified RN. Also noted HR did not seem to increase walking although did not observe entire walk. [id number]   Elissa Lovett [place] CES, ACSM 10/01/2012 11:28 AM

## 2012-10-01 NOTE — Progress Notes (Signed)
    [address]       [place] [address]             [phone]      Discharge Summary    Name: [name]  DOB: 12/28/1950 61 y.o.  MRN: [id number]    Admission Date: 09/28/2012  Discharge Date: 10/03/2012     Admitting Diagnosis:  Coronary artery disease     Discharge Diagnosis:  Coronary artery disease  Left ventricular dysfunction  Expected postoperative blood loss anemia     Past Medical History   Diagnosis  Date   .  Diabetes mellitus type II    .  Hyperlipidemia    .  Hypertension    .  Allergic rhinitis    .  Cataract    .  CAD (coronary artery disease)      a. 2004: s/p MI in [place]. No PCI->Medical RX; b. 07/2012 Cath: LM 30-40, LAD 70p, 70/80m, D1 80-90p, OM1 small 90p, OM2 large 80-90p, 50m, 70-80d, RCA 20-30 diff, EF 40%, glob HK.   .  Ischemic cardiomyopathy      a. 07/2012 Echo: EF 35%, Sev inferoseptal HK, mildly dil LA, Peak PASP 59mmHg.   .  PAF (paroxysmal atrial fibrillation)      a. 07/2012: Amio and xarelto initiated.   .  LBBB (left bundle branch block)      a. intermittent - present during rapid afib 07/2012.   .  Breast cancer  2002     Patient reports left breast cancer diagnosis in 2002 treated with bilateral mastectomy positive lymph nodes with left axillary dissection followed by chemotherapy of unknown type   .  Neuromuscular disorder      Patient reports chronic numbness in the right foot related to previous surgery on the right leg and "nerve damage"       Procedures:  CORONARY ARTERY BYPASS GRAFTING x 4 (Left internal mammary artery to left anterior descending, sequential saphenous vein graft to first and second obtuse marginals, saphenous vein graft to intermediate) ENDOSCOPIC VEIN HARVEST LEFT THIGH  LEFT SIDED MAZE PROCEDURE  PLACEMENT OF LEFT ATRIAL CLIP  LEFT VENTRICULAR EPICARDIAL PACING LEAD PLACEMENT on 09/28/2012     HPI: The patient is a 61 y.o. female with a history of coronary artery  disease, previously evaluated in [place] around 10 years ago. She was recently admitted to [facility] with atrial fibrillation and rapid ventricular response. She has a new onset left bundle branch block and there was initial concern about ventricular tachycardia. She converted to normal sinus rhythm with treatment with amiodarone. There was no evidence of an acute ischemic injury by biochemical markers.  She subsequently has undergone 3 different cardiac imaging studies. Her echocardiogram described a left ventricular ejection fraction of 35% with inferoseptal hypokinesia and moderate pulmonary hypertension with an estimated PA pressure of 59 mm Hg. The left atrium was described as mildly enlarged and there was mild left ventricular hypertrophy. She underwent cardiac catheterization by Dr. Peter Jordan, which showed diffuse 2 vessel coronary artery disease with moderate left ventricular dysfunction. Maximizing her medical treatment and stress testing was recommended.  She has not had any clinical recurrence of atrial fibrillation. She is currently receiving treatment with amiodarone but at a lower dose (the initial loading dose caused nausea). She has not had any bleeding problems. She has no history of stroke or tremors, ischemic attack or other embolic events. She has NYHA functional class II. She only expresses   symptoms of shortness of breath and very mild chest tightness when she hurries. She has taken nitroglycerin sublingually since her hospital discharge. She has been seen by cardiology in outpatient follow up, and it was felt that she would not be well served by percutaneous intervention due to her multifocal disease. She was subsequently referred to Dr. Gerhardt for cardiac surgical evaluation. Because of her progressive coronary disease and ongoing angina symptoms, it was felt that she would benefit from surgical revascularization. It was also felt that she would benefit from placement of a left  ventricular epicardial lead, in light of her left bundle branch block and decreased LV function. All risks, benefits and alternatives of surgery were explained in detail, and the patient agreed to proceed.      Hospital Course: The patient was admitted to [facility] on 09/28/2012. The patient was taken to the operating room and underwent the above procedure.    The postoperative course has been uneventful. She has remained in sinus rhythm, and has been restarted on low dose Amiodarone and Xarelto. Cardiology anticipates discontinuing Amiodarone in 30 days and checking a 30 day event monitor to determine if anticoagulants may be stopped. She has been mildly volume overloaded, and was started on Lasix, to which she has responded well. She is back to her baseline weight at present and Lasix has been discontinued. Her blood sugars are generally stable on home meds, and she will need outpatient follow up for her hemoglobin A1C of 9.1. She is afebrile and all vital signs are stable. She is ambulating in the halls without difficulty, and is tolerating a regular diet. Incisions are all healing well. She is presently medically stable for discharge on today's date.       Recent vital signs:  Filed Vitals:    10/01/12 0422   BP:  121/77   Pulse:  73   Temp:  98.6 F (37 C)   Resp:  18    Recent laboratory studies:  CBC:  Recent Labs   09/30/12 0315  10/01/12 0650   WBC  16.7*  14.7*   HGB  12.7  12.7   HCT  36.6  36.8   PLT  171  182    BMET:  Recent Labs   09/30/12 0315  10/01/12 0650   NA  136  131*   K  3.9  4.7   CL  100  97   CO2  27  27   GLUCOSE  98  186*   BUN  9  14   CREATININE  0.44*  0.56   CALCIUM  7.7*  8.1*    PT/INR:  Recent Labs   09/28/12 1350   LABPROT  18.2*   INR  1.55*         Discharge Medications:    Medication List     STOP taking these medications       isosorbide mononitrate 60 MG 24 hr tablet    Commonly known as: IMDUR     TAKE  these medications       amiodarone 200 MG tablet    Commonly known as: PACERONE    Take 1 tablet (200 mg total) by mouth daily.    aspirin 81 MG tablet    Take 1 tablet (81 mg total) by mouth daily.    atorvastatin 80 MG tablet    Commonly known as: LIPITOR    Take 1 tablet (80 mg total) by mouth daily.    carvedilol   12.5 MG tablet    Commonly known as: COREG    Take 1 tablet (12.5 mg total) by mouth 2 (two) times daily with a meal.    glimepiride 4 MG tablet    Commonly known as: AMARYL    Take 4 mg by mouth 2 (two) times daily.    losartan 25 MG tablet    Commonly known as: COZAAR    Take 2 tablets (50 mg total) by mouth 2 (two) times daily.    metFORMIN 1000 MG tablet    Commonly known as: GLUCOPHAGE    Take 1,000 mg by mouth 2 (two) times daily with a meal.    nitroGLYCERIN 0.4 MG SL tablet    Commonly known as: NITROSTAT    Place 1 tablet (0.4 mg total) under the tongue every 5 (five) minutes as needed for chest pain.    ONE TOUCH ULTRA SYSTEM KIT W/DEVICE Kit    1 kit by Does not apply route once.    ONETOUCH ULTRA BLUE VI    by In Vitro route daily.    oxyCODONE 5 MG immediate release tablet    Commonly known as: Oxy IR/ROXICODONE    Take 1-2 tablets (5-10 mg total) by mouth every 3 (three) hours as needed for pain.    Rivaroxaban 20 MG Tabs    Commonly known as: XARELTO    Take 1 tablet (20 mg total) by mouth daily with supper.      The patient has been discharged on:  1.Beta Blocker: Yes [ x ]  No [ ]  If No, reason:  2.Ace Inhibitor/ARB: Yes [ x ]  No [ ]  If No, reason:  3.Statin: Yes [ x ]  No [ ]  If No, reason:  4.Ecasa: Yes [ x ]  No [ ]  If No, reason:      Discharge Instructions: The patient is to refrain from driving, heavy lifting or strenuous activity. May shower daily and clean incisions with soap and water. May resume regular diet.      Follow Up:       Future Appointments  Provider  Department  Dept Phone    10/28/2012 9:30 AM   Mihai Croitoru, MD  [facility] [place]  [phone]    11/25/2012 9:30 AM  Jeffrey A Todd, MD  [facility]  [phone]      Follow-up Information    Follow up with GERHARDT,EDWARD B, MD On 10/22/2012. (Have a chest x-ray at [facility] at 3:00, then see MD at 4:00)    Contact information:    [address]  [address]  [place] [address]  [phone]       Follow up with CROITORU,MIHAI, MD. Schedule an appointment as soon as possible for a visit in 2 weeks.    Contact information:    [address]  [address]  [place] [address]  [phone]      Ladaija Dimino H  10/01/2012, 10:08 AM  

## 2012-10-01 NOTE — Progress Notes (Signed)
D/C EPW per protocol and as ordered, all ends intacts, no bleeding/ectopy noted, pt tolerated well.  VSS, INR 1.55. Pt reminded to lie supine approximately one hour.  Will continue to monitor.

## 2012-10-01 NOTE — Progress Notes (Signed)
[facility]  DAILY PROGRESS NOTE   Subjective:  Nausea improved after cutting back on opiates. Pain still well controlled. Feels generally well, hungry now. No atrial fibrillation. Conscientiously using incentive spirometer, walking. Objective:  Temp:  [98 F (36.7 C)-98.8 F (37.1 C)] 98.6 F (37 C) (07/24 0422) Pulse Rate:  [72-77] 73 (07/24 0422) Resp:  [14-21] 18 (07/24 0422) BP: (93-127)/(51-77) 121/77 mmHg (07/24 0422) SpO2:  [89 %-99 %] 94 % (07/24 0422) Weight:  [164 lb 4.8 oz (74.526 kg)] 164 lb 4.8 oz (74.526 kg) (07/24 0422) Weight change: 5 lb 4.8 oz (2.404 kg)  Intake/Output from previous day: 07/23 0701 - 07/24 0700 In: 500 [P.O.:480; I.V.:20] Out: 1260 [Urine:1240; Chest Tube:20]  Intake/Output from this shift:    Medications: Current Facility-Administered Medications  Medication Dose Route Frequency Provider Last Rate Last Dose  . 0.9 %  sodium chloride infusion  250 mL Intravenous PRN Delight Ovens, MD      . amiodarone (PACERONE) tablet 200 mg  200 mg Oral Daily Delight Ovens, MD   200 mg at 09/30/12 1050  . aspirin EC tablet 325 mg  325 mg Oral Daily Wilmon Pali, PA-C   325 mg at 09/29/12 1043  . aspirin EC tablet 81 mg  81 mg Oral Daily Delight Ovens, MD   81 mg at 09/30/12 1049  . atorvastatin (LIPITOR) tablet 80 mg  80 mg Oral Daily Wilmon Pali, PA-C   80 mg at 09/30/12 1049  . bisacodyl (DULCOLAX) EC tablet 10 mg  10 mg Oral Daily PRN Delight Ovens, MD       Or  . bisacodyl (DULCOLAX) suppository 10 mg  10 mg Rectal Daily PRN Delight Ovens, MD      . carvedilol (COREG) tablet 25 mg  25 mg Oral BID WC Delight Ovens, MD   25 mg at 09/30/12 1732  . docusate sodium (COLACE) capsule 200 mg  200 mg Oral Daily Delight Ovens, MD   200 mg at 09/30/12 1256  . furosemide (LASIX) tablet 40 mg  40 mg Oral Q breakfast Delight Ovens, MD   40 mg at 10/01/12 0746  . glimepiride (AMARYL) tablet 4 mg  4  mg Oral BID AC Delight Ovens, MD   4 mg at 10/01/12 0746  . insulin aspart (novoLOG) injection 0-24 Units  0-24 Units Subcutaneous TID AC & HS Delight Ovens, MD   2 Units at 10/01/12 [phone]  . insulin detemir (LEVEMIR) injection 10 Units  10 Units Subcutaneous Q1200 Delight Ovens, MD   10 Units at 09/30/12 1256  . losartan (COZAAR) tablet 50 mg  50 mg Oral BID Delight Ovens, MD   50 mg at 09/30/12 2156  . metFORMIN (GLUCOPHAGE) tablet 1,000 mg  1,000 mg Oral BID WC Delight Ovens, MD   1,000 mg at 10/01/12 0746  . oxyCODONE (Oxy IR/ROXICODONE) immediate release tablet 5-10 mg  5-10 mg Oral Q3H PRN Delight Ovens, MD   10 mg at 09/30/12 1944  . pantoprazole (PROTONIX) EC tablet 40 mg  40 mg Oral QAC breakfast Delight Ovens, MD   40 mg at 10/01/12 0746  . potassium chloride SA (K-DUR,KLOR-CON) CR tablet 20 mEq  20 mEq Oral Daily Delight Ovens, MD   20 mEq at 09/30/12 1257  . Rivaroxaban (XARELTO) tablet 20 mg  20 mg Oral Q supper Delight Ovens, MD      .  sodium chloride 0.9 % injection 3 mL  3 mL Intravenous Q12H Delight Ovens, MD   3 mL at 09/30/12 1112  . sodium chloride 0.9 % injection 3 mL  3 mL Intravenous PRN Delight Ovens, MD      . traMADol Janean Sark) tablet 50-100 mg  50-100 mg Oral Q4H PRN Delight Ovens, MD        Physical Exam: General appearance: alert, cooperative and no distress Neck: no adenopathy, no carotid bruit, no JVD, supple, symmetrical, trachea midline and thyroid not enlarged, symmetric, no tenderness/mass/nodules Lungs: clear to auscultation bilaterally Heart: regular rate and rhythm, S1, S2 normal, no murmur, click, rub or gallop Abdomen: soft, non-tender; bowel sounds normal; no masses,  no organomegaly Extremities: extremities normal, atraumatic, no cyanosis or edema Pulses: 2+ and symmetric Skin: Skin color, texture, turgor normal. No rashes or lesions Neurologic: Alert and oriented X 3, normal strength and tone. Normal  symmetric reflexes. Normal coordination and gait  Lab Results: Results for orders placed during the hospital encounter of 09/28/12 (from the past 48 hour(s))  GLUCOSE, CAPILLARY     Status: Abnormal   Collection Time    09/29/12  8:23 AM      Result Value Range   Glucose-Capillary 101 (*) 70 - 99 mg/dL  GLUCOSE, CAPILLARY     Status: None   Collection Time    09/29/12  8:53 AM      Result Value Range   Glucose-Capillary 95  70 - 99 mg/dL  GLUCOSE, CAPILLARY     Status: None   Collection Time    09/29/12 10:04 AM      Result Value Range   Glucose-Capillary 98  70 - 99 mg/dL  GLUCOSE, CAPILLARY     Status: None   Collection Time    09/29/12 11:09 AM      Result Value Range   Glucose-Capillary 97  70 - 99 mg/dL  GLUCOSE, CAPILLARY     Status: Abnormal   Collection Time    09/29/12 11:58 AM      Result Value Range   Glucose-Capillary 118 (*) 70 - 99 mg/dL  GLUCOSE, CAPILLARY     Status: Abnormal   Collection Time    09/29/12  1:09 PM      Result Value Range   Glucose-Capillary 106 (*) 70 - 99 mg/dL  GLUCOSE, CAPILLARY     Status: Abnormal   Collection Time    09/29/12  1:55 PM      Result Value Range   Glucose-Capillary 112 (*) 70 - 99 mg/dL  GLUCOSE, CAPILLARY     Status: None   Collection Time    09/29/12  2:23 PM      Result Value Range   Glucose-Capillary 92  70 - 99 mg/dL  GLUCOSE, CAPILLARY     Status: None   Collection Time    09/29/12  3:12 PM      Result Value Range   Glucose-Capillary 94  70 - 99 mg/dL  MAGNESIUM     Status: None   Collection Time    09/29/12  5:00 PM      Result Value Range   Magnesium 1.9  1.5 - 2.5 mg/dL  CBC     Status: Abnormal   Collection Time    09/29/12  5:00 PM      Result Value Range   WBC 16.4 (*) 4.0 - 10.5 K/uL   RBC 4.06  3.87 - 5.11 MIL/uL   Hemoglobin 12.6  12.0 - 15.0 g/dL   HCT 54.0  98.1 - 19.1 %   MCV 89.9  78.0 - 100.0 fL   MCH 31.0  26.0 - 34.0 pg   MCHC 34.5  30.0 - 36.0 g/dL   RDW 47.8  29.5 - 62.1 %    Platelets 173  150 - 400 K/uL  CREATININE, SERUM     Status: Abnormal   Collection Time    09/29/12  5:00 PM      Result Value Range   Creatinine, Ser 0.41 (*) 0.50 - 1.10 mg/dL   GFR calc non Af Amer >90  >90 mL/min   GFR calc Af Amer >90  >90 mL/min   Comment:            The eGFR has been calculated     using the CKD EPI equation.     This calculation has not been     validated in all clinical     situations.     eGFR's persistently     <90 mL/min signify     possible Chronic Kidney Disease.  POCT I-STAT, CHEM 8     Status: Abnormal   Collection Time    09/29/12  5:06 PM      Result Value Range   Sodium 136  135 - 145 mEq/L   Potassium 4.3  3.5 - 5.1 mEq/L   Chloride 99  96 - 112 mEq/L   BUN 7  6 - 23 mg/dL   Creatinine, Ser 3.08  0.50 - 1.10 mg/dL   Glucose, Bld 657 (*) 70 - 99 mg/dL   Calcium, Ion 8.46 (*) 1.13 - 1.30 mmol/L   TCO2 24  0 - 100 mmol/L   Hemoglobin 12.6  12.0 - 15.0 g/dL   HCT 96.2  95.2 - 84.1 %  GLUCOSE, CAPILLARY     Status: None   Collection Time    09/29/12 11:49 PM      Result Value Range   Glucose-Capillary 98  70 - 99 mg/dL   Comment 1 Documented in Chart     Comment 2 Notify RN    BASIC METABOLIC PANEL     Status: Abnormal   Collection Time    09/30/12  3:15 AM      Result Value Range   Sodium 136  135 - 145 mEq/L   Potassium 3.9  3.5 - 5.1 mEq/L   Chloride 100  96 - 112 mEq/L   CO2 27  19 - 32 mEq/L   Glucose, Bld 98  70 - 99 mg/dL   BUN 9  6 - 23 mg/dL   Creatinine, Ser 3.24 (*) 0.50 - 1.10 mg/dL   Comment: DELTA CHECK NOTED   Calcium 7.7 (*) 8.4 - 10.5 mg/dL   GFR calc non Af Amer >90  >90 mL/min   GFR calc Af Amer >90  >90 mL/min   Comment:            The eGFR has been calculated     using the CKD EPI equation.     This calculation has not been     validated in all clinical     situations.     eGFR's persistently     <90 mL/min signify     possible Chronic Kidney Disease.  CBC     Status: Abnormal   Collection Time     09/30/12  3:15 AM      Result Value Range   WBC 16.7 (*) 4.0 -  10.5 K/uL   RBC 4.02  3.87 - 5.11 MIL/uL   Hemoglobin 12.7  12.0 - 15.0 g/dL   HCT 16.1  09.6 - 04.5 %   MCV 91.0  78.0 - 100.0 fL   MCH 31.6  26.0 - 34.0 pg   MCHC 34.7  30.0 - 36.0 g/dL   RDW 40.9  81.1 - 91.4 %   Platelets 171  150 - 400 K/uL  GLUCOSE, CAPILLARY     Status: None   Collection Time    09/30/12  4:46 AM      Result Value Range   Glucose-Capillary 93  70 - 99 mg/dL   Comment 1 Documented in Chart     Comment 2 Notify RN    GLUCOSE, CAPILLARY     Status: Abnormal   Collection Time    09/30/12  8:07 AM      Result Value Range   Glucose-Capillary 104 (*) 70 - 99 mg/dL   Comment 1 Documented in Chart     Comment 2 Notify RN    GLUCOSE, CAPILLARY     Status: Abnormal   Collection Time    09/30/12 12:08 PM      Result Value Range   Glucose-Capillary 172 (*) 70 - 99 mg/dL   Comment 1 Documented in Chart     Comment 2 Notify RN    GLUCOSE, CAPILLARY     Status: Abnormal   Collection Time    09/30/12  4:33 PM      Result Value Range   Glucose-Capillary 172 (*) 70 - 99 mg/dL   Comment 1 Notify RN     Comment 2 Documented in Chart    GLUCOSE, CAPILLARY     Status: Abnormal   Collection Time    09/30/12  9:55 PM      Result Value Range   Glucose-Capillary 166 (*) 70 - 99 mg/dL  CBC     Status: Abnormal   Collection Time    10/01/12  6:50 AM      Result Value Range   WBC 14.7 (*) 4.0 - 10.5 K/uL   RBC 4.04  3.87 - 5.11 MIL/uL   Hemoglobin 12.7  12.0 - 15.0 g/dL   HCT 78.2  95.6 - 21.3 %   MCV 91.1  78.0 - 100.0 fL   MCH 31.4  26.0 - 34.0 pg   MCHC 34.5  30.0 - 36.0 g/dL   RDW 08.6  57.8 - 46.9 %   Platelets 182  150 - 400 K/uL    Imaging: Imaging results have been reviewed and Dg Chest 2 View  10/01/2012   *RADIOLOGY REPORT*  Clinical Data: Shortness of breath.  CHEST - 2 VIEW  Comparison: Chest x-ray 09/30/2012.  Findings: The previously noted right IJ catheter has been removed. Epicardial  pacing wire with lead projecting over the left ventricular apex, attached to any pacemaker device at this time. Status post median sternotomy for CABG.  Lung volumes are normal. Bibasilar opacities favored to represent some postoperative atelectasis with overlying small bilateral pleural effusions (left greater than right), slightly improved.  Mild cephalization of the pulmonary vasculature, without frank pulmonary edema.  Mild enlargement of the cardiopericardial silhouette is unchanged. The patient is rotated to the right on today's exam, resulting in distortion of the mediastinal contours and reduced diagnostic sensitivity and specificity for mediastinal pathology.  The surgical clips in the lateral aspects of the breast bilaterally in the lower left axilla.  IMPRESSION: 1.  Support  apparatus and postoperative changes, as above. 2.  Slight improvement in lung volumes with increasing bibasilar aeration, likely to reflect resolving areas of postoperative atelectasis and slight decrease in small bilateral pleural effusions. 3.  Pulmonary venous congestion, without frank pulmonary edema.   Original Report Authenticated By: Trudie Reed, M.D.   Dg Chest Port 1 View  09/30/2012   *RADIOLOGY REPORT*  Clinical Data: Coronary artery disease.  Postop from CABG.  PORTABLE CHEST - 1 VIEW  Comparison: 09/29/2012  Findings: The Swan-Ganz catheter and left chest tube have been removed.  No pneumothorax identified.  Small bilateral pleural effusions and bibasilar atelectasis show mild worsening since prior study.  Cardiomegaly remains stable.  IMPRESSION: Mild worsening of bibasilar atelectasis and small bilateral pleural effusions.  No pneumothorax identified.   Original Report Authenticated By: Myles Rosenthal, M.D.    Assessment:  1. Principal Problem: 2.   S/P CABG x 4: 09/28/12 (LIMA-LAD, SVG-OM1-OM2, SVG-Intermediate) 3. Active Problems: 4.   DIABETES MELLITUS TYPE 1-UNCONTROLLED 5.   Ischemic cardiomyopathy 6.    CAD (coronary artery disease) 7.   Plan:  1. Good progress 2. No recurrence of AF post MAZE/amio. Would DC amio after 30 days and check a 30 day event monitor to see if long term anticoagulants can be stopped. Resume Xarelto at DC (or when Dr. Tyrone Sage feels it is safe to do so).  Time Spent Directly with Patient:  25 minutes  Length of Stay:  LOS: 3 days    Kesley Gaffey 10/01/2012, 7:51 AM

## 2012-10-02 LAB — CBC
HCT: 33.9 % — ABNORMAL LOW (ref 36.0–46.0)
Hemoglobin: 11.3 g/dL — ABNORMAL LOW (ref 12.0–15.0)
MCH: 30.6 pg (ref 26.0–34.0)
MCHC: 33.3 g/dL (ref 30.0–36.0)
MCV: 91.9 fL (ref 78.0–100.0)
Platelets: 188 10*3/uL (ref 150–400)
RBC: 3.69 MIL/uL — ABNORMAL LOW (ref 3.87–5.11)
RDW: 13.3 % (ref 11.5–15.5)
WBC: 11.4 10*3/uL — ABNORMAL HIGH (ref 4.0–10.5)

## 2012-10-02 LAB — BASIC METABOLIC PANEL
BUN: 11 mg/dL (ref 6–23)
CO2: 31 mEq/L (ref 19–32)
Calcium: 7.7 mg/dL — ABNORMAL LOW (ref 8.4–10.5)
Chloride: 98 mEq/L (ref 96–112)
Creatinine, Ser: 0.55 mg/dL (ref 0.50–1.10)
GFR calc Af Amer: >90 mL/min (ref >90)
GFR calc non Af Amer: >90 mL/min (ref >90)
Glucose, Bld: 177 mg/dL — ABNORMAL HIGH (ref 70–99)
Potassium: 4.1 mEq/L (ref 3.5–5.1)
Sodium: 135 mEq/L (ref 135–145)

## 2012-10-02 LAB — GLUCOSE, CAPILLARY
Glucose-Capillary: 149 mg/dL — ABNORMAL HIGH (ref 70–99)
Glucose-Capillary: 233 mg/dL — ABNORMAL HIGH (ref 70–99)
Glucose-Capillary: 199 mg/dL — ABNORMAL HIGH (ref 70–99)
Glucose-Capillary: 199 mg/dL — ABNORMAL HIGH (ref 70–99)
Glucose-Capillary: 176 mg/dL — ABNORMAL HIGH (ref 70–99)

## 2012-10-02 NOTE — Progress Notes (Signed)
CARDIAC REHAB PHASE I   PRE:  Rate/Rhythm: 78 SR  BP:  Sitting: 110/68      Walking: 109/62     SaO2: 94 RA, 98 2L  MODE:  Ambulation: 250 ft   POST:  Rate/Rhythm: 82 SR  BP:  Sitting: 106/57    SaO2: 97 RA  Pt walked 250 ft with c/o being tired, with walker and x1 assist.  Very slow, but steady gait and no rest periods.  Pt ambulated partial walk with 2L O2 and walked without O2, with stable O2 Sat from 94-99%.  Pt c/o of very mild SOB when walking without O2.  Reviewed education with pt.  Pt interested in CRP II in [place]. [id number]  Marvene Staff MS, ACSM RCEP 10:09 AM 10/02/2012

## 2012-10-02 NOTE — Progress Notes (Signed)
DAILY PROGRESS NOTE  Subjective:  Mild hypotension yesterday, may have been related to pacer wire removal. Feels well today. Ambulating without difficulty. Blood pressure is now normal.  Objective:  Temp:  [98.2 F (36.8 C)-99 F (37.2 C)] 99 F (37.2 C) (07/25 0419) Pulse Rate:  [73-75] 75 (07/25 0419) Resp:  [18] 18 (07/25 0419) BP: (79-125)/(35-75) 125/64 mmHg (07/25 0803) SpO2:  [94 %-97 %] 97 % (07/25 0419) Weight:  [166 lb 0.1 oz (75.3 kg)] 166 lb 0.1 oz (75.3 kg) (07/25 0419) Weight change: 1 lb 11.3 oz (0.774 kg)  Intake/Output from previous day: 07/24 0701 - 07/25 0700 In: 483 [P.O.:480; I.V.:3] Out: 502 [Urine:500; Stool:2]  Intake/Output from this shift:    Medications: Current Facility-Administered Medications  Medication Dose Route Frequency Provider Last Rate Last Dose  . 0.9 %  sodium chloride infusion  250 mL Intravenous PRN Delight Ovens, MD      . amiodarone (PACERONE) tablet 200 mg  200 mg Oral Daily Delight Ovens, MD   200 mg at 10/01/12 1011  . aspirin EC tablet 81 mg  81 mg Oral Daily Delight Ovens, MD   81 mg at 10/01/12 1011  . atorvastatin (LIPITOR) tablet 80 mg  80 mg Oral Daily Wilmon Pali, PA-C   80 mg at 10/01/12 1011  . bisacodyl (DULCOLAX) EC tablet 10 mg  10 mg Oral Daily PRN Delight Ovens, MD       Or  . bisacodyl (DULCOLAX) suppository 10 mg  10 mg Rectal Daily PRN Delight Ovens, MD      . carvedilol (COREG) tablet 12.5 mg  12.5 mg Oral BID WC Donielle Margaretann Loveless, PA-C   12.5 mg at 10/02/12 0801  . docusate sodium (COLACE) capsule 200 mg  200 mg Oral Daily Delight Ovens, MD   200 mg at 09/30/12 1256  . glimepiride (AMARYL) tablet 4 mg  4 mg Oral BID AC Delight Ovens, MD   4 mg at 10/02/12 0801  . insulin aspart (novoLOG) injection 0-24 Units  0-24 Units Subcutaneous TID AC & HS Delight Ovens, MD   8 Units at 10/02/12 [phone]  . losartan (COZAAR) tablet 25 mg  25 mg Oral BID Ardelle Balls, PA-C   25 mg  at 10/01/12 2113  . metFORMIN (GLUCOPHAGE) tablet 1,000 mg  1,000 mg Oral BID WC Delight Ovens, MD   1,000 mg at 10/02/12 0801  . oxyCODONE (Oxy IR/ROXICODONE) immediate release tablet 5-10 mg  5-10 mg Oral Q3H PRN Delight Ovens, MD   5 mg at 10/01/12 2113  . pantoprazole (PROTONIX) EC tablet 40 mg  40 mg Oral QAC breakfast Delight Ovens, MD   40 mg at 10/02/12 0801  . potassium chloride SA (K-DUR,KLOR-CON) CR tablet 20 mEq  20 mEq Oral Daily Delight Ovens, MD   20 mEq at 10/01/12 1011  . Rivaroxaban (XARELTO) tablet 20 mg  20 mg Oral Q supper Donielle M Zimmerman, PA-C      . sodium chloride 0.9 % injection 3 mL  3 mL Intravenous Q12H Delight Ovens, MD   3 mL at 10/01/12 2117  . sodium chloride 0.9 % injection 3 mL  3 mL Intravenous PRN Delight Ovens, MD      . traMADol Janean Sark) tablet 50-100 mg  50-100 mg Oral Q4H PRN Delight Ovens, MD        Physical Exam: deferred  Lab Results: Results for orders  placed during the hospital encounter of 09/28/12 (from the past 48 hour(s))  GLUCOSE, CAPILLARY     Status: Abnormal   Collection Time    09/30/12 12:08 PM      Result Value Range   Glucose-Capillary 172 (*) 70 - 99 mg/dL   Comment 1 Documented in Chart     Comment 2 Notify RN    GLUCOSE, CAPILLARY     Status: Abnormal   Collection Time    09/30/12  4:33 PM      Result Value Range   Glucose-Capillary 172 (*) 70 - 99 mg/dL   Comment 1 Notify RN     Comment 2 Documented in Chart    GLUCOSE, CAPILLARY     Status: Abnormal   Collection Time    09/30/12  9:55 PM      Result Value Range   Glucose-Capillary 166 (*) 70 - 99 mg/dL  GLUCOSE, CAPILLARY     Status: Abnormal   Collection Time    10/01/12  6:18 AM      Result Value Range   Glucose-Capillary 149 (*) 70 - 99 mg/dL  CBC     Status: Abnormal   Collection Time    10/01/12  6:50 AM      Result Value Range   WBC 14.7 (*) 4.0 - 10.5 K/uL   RBC 4.04  3.87 - 5.11 MIL/uL   Hemoglobin 12.7  12.0 - 15.0  g/dL   HCT 16.1  09.6 - 04.5 %   MCV 91.1  78.0 - 100.0 fL   MCH 31.4  26.0 - 34.0 pg   MCHC 34.5  30.0 - 36.0 g/dL   RDW 40.9  81.1 - 91.4 %   Platelets 182  150 - 400 K/uL  BASIC METABOLIC PANEL     Status: Abnormal   Collection Time    10/01/12  6:50 AM      Result Value Range   Sodium 131 (*) 135 - 145 mEq/L   Potassium 4.7  3.5 - 5.1 mEq/L   Chloride 97  96 - 112 mEq/L   CO2 27  19 - 32 mEq/L   Glucose, Bld 186 (*) 70 - 99 mg/dL   BUN 14  6 - 23 mg/dL   Creatinine, Ser 7.82  0.50 - 1.10 mg/dL   Calcium 8.1 (*) 8.4 - 10.5 mg/dL   GFR calc non Af Amer >90  >90 mL/min   GFR calc Af Amer >90  >90 mL/min   Comment:            The eGFR has been calculated     using the CKD EPI equation.     This calculation has not been     validated in all clinical     situations.     eGFR's persistently     <90 mL/min signify     possible Chronic Kidney Disease.  GLUCOSE, CAPILLARY     Status: Abnormal   Collection Time    10/01/12 11:23 AM      Result Value Range   Glucose-Capillary 291 (*) 70 - 99 mg/dL   Comment 1 Documented in Chart     Comment 2 Notify RN    GLUCOSE, CAPILLARY     Status: Abnormal   Collection Time    10/01/12  4:19 PM      Result Value Range   Glucose-Capillary 172 (*) 70 - 99 mg/dL  GLUCOSE, CAPILLARY     Status: Abnormal   Collection Time  10/01/12  9:14 PM      Result Value Range   Glucose-Capillary 187 (*) 70 - 99 mg/dL  BASIC METABOLIC PANEL     Status: Abnormal   Collection Time    10/02/12  4:10 AM      Result Value Range   Sodium 135  135 - 145 mEq/L   Potassium 4.1  3.5 - 5.1 mEq/L   Chloride 98  96 - 112 mEq/L   CO2 31  19 - 32 mEq/L   Glucose, Bld 177 (*) 70 - 99 mg/dL   BUN 11  6 - 23 mg/dL   Creatinine, Ser 1.61  0.50 - 1.10 mg/dL   Calcium 7.7 (*) 8.4 - 10.5 mg/dL   GFR calc non Af Amer >90  >90 mL/min   GFR calc Af Amer >90  >90 mL/min   Comment:            The eGFR has been calculated     using the CKD EPI equation.     This  calculation has not been     validated in all clinical     situations.     eGFR's persistently     <90 mL/min signify     possible Chronic Kidney Disease.  CBC     Status: Abnormal   Collection Time    10/02/12  4:10 AM      Result Value Range   WBC 11.4 (*) 4.0 - 10.5 K/uL   RBC 3.69 (*) 3.87 - 5.11 MIL/uL   Hemoglobin 11.3 (*) 12.0 - 15.0 g/dL   HCT 09.6 (*) 04.5 - 40.9 %   MCV 91.9  78.0 - 100.0 fL   MCH 30.6  26.0 - 34.0 pg   MCHC 33.3  30.0 - 36.0 g/dL   RDW 81.1  91.4 - 78.2 %   Platelets 188  150 - 400 K/uL  GLUCOSE, CAPILLARY     Status: Abnormal   Collection Time    10/02/12  6:12 AM      Result Value Range   Glucose-Capillary 233 (*) 70 - 99 mg/dL    Imaging: Dg Chest 2 View  10/01/2012   *RADIOLOGY REPORT*  Clinical Data: Shortness of breath.  CHEST - 2 VIEW  Comparison: Chest x-ray 09/30/2012.  Findings: The previously noted right IJ catheter has been removed. Epicardial pacing wire with lead projecting over the left ventricular apex, attached to any pacemaker device at this time. Status post median sternotomy for CABG.  Lung volumes are normal. Bibasilar opacities favored to represent some postoperative atelectasis with overlying small bilateral pleural effusions (left greater than right), slightly improved.  Mild cephalization of the pulmonary vasculature, without frank pulmonary edema.  Mild enlargement of the cardiopericardial silhouette is unchanged. The patient is rotated to the right on today's exam, resulting in distortion of the mediastinal contours and reduced diagnostic sensitivity and specificity for mediastinal pathology.  The surgical clips in the lateral aspects of the breast bilaterally in the lower left axilla.  IMPRESSION: 1.  Support apparatus and postoperative changes, as above. 2.  Slight improvement in lung volumes with increasing bibasilar aeration, likely to reflect resolving areas of postoperative atelectasis and slight decrease in small bilateral  pleural effusions. 3.  Pulmonary venous congestion, without frank pulmonary edema.   Original Report Authenticated By: Trudie Reed, M.D.    Assessment:  1. Principal Problem: 2.   S/P CABG x 4: 09/28/12 (LIMA-LAD, SVG-OM1-OM2, SVG-Intermediate) 3. Active Problems: 4.   DIABETES MELLITUS  TYPE 1-UNCONTROLLED 5.   Ischemic cardiomyopathy 6.   CAD (coronary artery disease) 7.   Plan:  1. Agree with restart xarelto. Maintaining sinus. Plan to wean down amiodarone to off, hopefully as an outpatient. On low dose cozaar - could d/c if bp remains soft. Follow-up with Dr. Royann Shivers.  Time Spent Directly with Patient:  15 minutes  Length of Stay:  LOS: 4 days   Chrystie Nose, MD, [facility] Attending Cardiologist [facility]  HILTY,Kenneth C 10/02/2012, 9:56 AM

## 2012-10-02 NOTE — Progress Notes (Addendum)
       [address]       [place] [address]             [phone]          4 Days Post-Op Procedure(s) (LRB): CORONARY ARTERY BYPASS GRAFTING (CABG) (N/A) MAZE (N/A) INTRAOPERATIVE TRANSESOPHAGEAL ECHOCARDIOGRAM (N/A) EPICARDIAL PACING LEAD PLACEMENT (N/A)  Subjective: BPs were low after meds given yesterday, pt felt weak.  Doses decreased, and currently she is feeling much better.  No new complaints.   Objective: Vital signs in last 24 hours: Patient Vitals for the past 24 hrs:  BP Temp Temp src Pulse Resp SpO2 Weight  10/02/12 0803 125/64 mmHg - - - - - -  10/02/12 0419 120/74 mmHg 99 F (37.2 C) Oral 75 18 97 % 166 lb 0.1 oz (75.3 kg)  10/01/12 2019 121/75 mmHg 98.4 F (36.9 C) Oral 73 18 94 % -  10/01/12 1743 117/59 mmHg - - - - - -  10/01/12 1351 86/44 mmHg 98.2 F (36.8 C) Oral 74 18 95 % -  10/01/12 1231 93/46 mmHg - - - - - -  10/01/12 1220 87/53 mmHg - - - - - -  10/01/12 1208 79/41 mmHg - - - - - -  10/01/12 1145 85/35 mmHg - - - - - -  10/01/12 1124 83/39 mmHg - - - - - -   Current Weight  10/02/12 166 lb 0.1 oz (75.3 kg)  PRE-OPERATIVE WEIGHT: 72 kg    Intake/Output from previous day: 07/24 0701 - 07/25 0700 In: 483 [P.O.:480; I.V.:3] Out: 502 [Urine:500; Stool:2]  CBGs [id number]   PHYSICAL EXAM:  Heart: RRR Lungs: Clear Wound: Clean and dry Extremities: Trace LLE edema   Lab Results: CBC: Recent Labs  10/01/12 0650 10/02/12 0410  WBC 14.7* 11.4*  HGB 12.7 11.3*  HCT 36.8 33.9*  PLT 182 188   BMET:  Recent Labs  10/01/12 0650 10/02/12 0410  NA 131* 135  K 4.7 4.1  CL 97 98  CO2 27 31  GLUCOSE 186* 177*  BUN 14 11  CREATININE 0.56 0.55  CALCIUM 8.1* 7.7*    PT/INR: No results found for this basename: LABPROT, INR,  in the last 72 hours    Assessment/Plan: S/P Procedure(s) (LRB): CORONARY ARTERY BYPASS GRAFTING (CABG) (N/A) MAZE (N/A) INTRAOPERATIVE TRANSESOPHAGEAL ECHOCARDIOGRAM  (N/A) EPICARDIAL PACING LEAD PLACEMENT (N/A) CV- hypotension, improved with decreased beta blocker, ARB doses. Xarelto restarted. Continue to monitor. DM- sugars generally stable on home meds. Vol overload- diurese. Home  in am if BPs stable.    LOS: 4 days    COLLINS,GINA H 10/02/2012  Holding sinus rhythm I have seen and examined [name] and agree with the above assessment  and plan.  Delight Ovens MD Beeper [phone] Office [phone] 10/02/2012 11:37 AM

## 2012-10-02 NOTE — Progress Notes (Signed)
Inpatient Diabetes Program Recommendations  AACE/ADA: New Consensus Statement on Inpatient Glycemic Control (2013)  Target Ranges:  Prepandial:   less than 140 mg/dL      Peak postprandial:   less than 180 mg/dL (1-2 hours)      Critically ill patients:  140 - 180 mg/dL   Reason for Visit: Diabetes Coordinator spoke with patient concerning A1C=9.1  Patient is aware of elevated A1C and said she has an appointment to follow up with her primary MD soon after discharge. Pt. states that she has been taking her DM meds as prescribed. Patient does not have any questions/concerns at this time. Thank you  Piedad Climes BSN, RN,CDE Inpatient Diabetes Coordinator [phone] (team pager)

## 2012-10-02 NOTE — Progress Notes (Signed)
Inpatient Diabetes Program Recommendations  AACE/ADA: New Consensus Statement on Inpatient Glycemic Control (2013)  Target Ranges:  Prepandial:   less than 140 mg/dL      Peak postprandial:   less than 180 mg/dL (1-2 hours)      Critically ill patients:  140 - 180 mg/dL  Results for SHIRLEAN, BERMAN (MRN [id number]) as of 10/02/2012 12:34  Ref. Range 10/01/2012 11:23 10/01/2012 16:19 10/01/2012 21:14 10/02/2012 06:12 10/02/2012 11:48  Glucose-Capillary Latest Range: 70-99 mg/dL 409 (H) 811 (H) 914 (H) 233 (H) 199 (H)   Inpatient Diabetes Program Recommendations Insulin - Basal: consider adding low dose basal insulin Lantus or Levemir 10 units  Thank you  Piedad Climes BSN, RN,CDE Inpatient Diabetes Coordinator [phone] (team pager)

## 2012-10-03 LAB — GLUCOSE, CAPILLARY: Glucose-Capillary: 169 mg/dL — ABNORMAL HIGH (ref 70–99)

## 2012-10-03 MED ORDER — CARVEDILOL 12.5 MG PO TABS: 12.5000 mg | ORAL_TABLET | Freq: Two times a day (BID) | ORAL | Status: DC

## 2012-10-03 MED ORDER — LOSARTAN POTASSIUM 25 MG PO TABS: 50.0000 mg | ORAL_TABLET | Freq: Two times a day (BID) | ORAL | Status: DC

## 2012-10-03 MED ORDER — OXYCODONE HCL 5 MG PO TABS: 5.0000 mg | ORAL_TABLET | ORAL | Status: DC | PRN

## 2012-10-03 NOTE — Progress Notes (Signed)
       [address]       [place] [address]             [phone]          5 Days Post-Op Procedure(s) (LRB): CORONARY ARTERY BYPASS GRAFTING (CABG) (N/A) MAZE (N/A) INTRAOPERATIVE TRANSESOPHAGEAL ECHOCARDIOGRAM (N/A) EPICARDIAL PACING LEAD PLACEMENT (N/A)  Subjective: Feeling much better today.  Ready to go home.   Objective: Vital signs in last 24 hours: Patient Vitals for the past 24 hrs:  BP Temp Temp src Pulse Resp SpO2 Weight  10/03/12 0458 155/77 mmHg 97.3 F (36.3 C) Oral 79 18 96 % 158 lb 15.2 oz (72.1 kg)  10/02/12 2200 109/56 mmHg 98.7 F (37.1 C) Oral 74 18 97 % -  10/02/12 1717 119/59 mmHg - - 78 - - -  10/02/12 1500 107/64 mmHg 99.1 F (37.3 C) Oral 77 20 95 % -  10/02/12 1049 107/54 mmHg - - - - - -  10/02/12 0803 125/64 mmHg - - - - - -   Current Weight  10/03/12 158 lb 15.2 oz (72.1 kg)  PRE-OPERATIVE WEIGHT: 72 kg    Intake/Output from previous day: 07/25 0701 - 07/26 0700 In: 1083 [P.O.:1080; I.V.:3] Out: -   CBGs [phone]   PHYSICAL EXAM:  Heart: RRR Lungs: Clear Wound: Clean and dry Extremities: No significant LE edema    Lab Results: CBC: Recent Labs  10/01/12 0650 10/02/12 0410  WBC 14.7* 11.4*  HGB 12.7 11.3*  HCT 36.8 33.9*  PLT 182 188   BMET:  Recent Labs  10/01/12 0650 10/02/12 0410  NA 131* 135  K 4.7 4.1  CL 97 98  CO2 27 31  GLUCOSE 186* 177*  BUN 14 11  CREATININE 0.56 0.55  CALCIUM 8.1* 7.7*    PT/INR: No results found for this basename: LABPROT, INR,  in the last 72 hours    Assessment/Plan: S/P Procedure(s) (LRB): CORONARY ARTERY BYPASS GRAFTING (CABG) (N/A) MAZE (N/A) INTRAOPERATIVE TRANSESOPHAGEAL ECHOCARDIOGRAM (N/A) EPICARDIAL PACING LEAD PLACEMENT (N/A) Discharge home today- instructions reviewed with patient.   LOS: 5 days    Chrishonda Hesch H 10/03/2012

## 2012-10-04 NOTE — Discharge Summary (Signed)
[address]       [name] [address]             [phone]      Discharge Summary    Name: [name]  DOB: [birth year] 62 y.o.  MRN: [id number]    Admission Date: 09/28/2012  Discharge Date: 10/03/2012     Admitting Diagnosis:  Coronary artery disease     Discharge Diagnosis:  Coronary artery disease  Left ventricular dysfunction  Expected postoperative blood loss anemia     Past Medical History   Diagnosis  Date   .  Diabetes mellitus type II    .  Hyperlipidemia    .  Hypertension    .  Allergic rhinitis    .  Cataract    .  CAD (coronary artery disease)      a. 2004: s/p MI in [place]. No PCI->Medical RX; b. 07/2012 Cath: LM 30-40, LAD 70p, 70/65m, D1 80-90p, OM1 small 90p, OM2 large 80-90p, 65m, 70-80d, RCA 20-30 diff, EF 40%, glob HK.   .  Ischemic cardiomyopathy      a. 07/2012 Echo: EF 35%, Sev inferoseptal HK, mildly dil LA, Peak PASP .   Marland Kitchen  PAF (paroxysmal atrial fibrillation)      a. 07/2012: Amio and xarelto initiated.   Marland Kitchen  LBBB (left bundle branch block)      a. intermittent - present during rapid afib 07/2012.   .  Breast cancer  2002     Patient reports left breast cancer diagnosis in 2002 treated with bilateral mastectomy positive lymph nodes with left axillary dissection followed by chemotherapy of unknown type   .  Neuromuscular disorder      Patient reports chronic numbness in the right foot related to previous surgery on the right leg and "nerve damage"       Procedures:  CORONARY ARTERY BYPASS GRAFTING x 4 (Left internal mammary artery to left anterior descending, sequential saphenous vein graft to first and second obtuse marginals, saphenous vein graft to intermediate) ENDOSCOPIC VEIN HARVEST LEFT THIGH  LEFT SIDED MAZE PROCEDURE  PLACEMENT OF LEFT ATRIAL CLIP  LEFT VENTRICULAR EPICARDIAL PACING LEAD PLACEMENT on 09/28/2012     HPI: The patient is a 62 y.o. female with a history of coronary artery  disease, previously evaluated in [place] around 10 years ago. She was recently admitted to [facility] with atrial fibrillation and rapid ventricular response. She has a new onset left bundle branch block and there was initial concern about ventricular tachycardia. She converted to normal sinus rhythm with treatment with amiodarone. There was no evidence of an acute ischemic injury by biochemical markers.  She subsequently has undergone 3 different cardiac imaging studies. Her echocardiogram described a left ventricular ejection fraction of 35% with inferoseptal hypokinesia and moderate pulmonary hypertension with an estimated PA pressure of 59 mm Hg. The left atrium was described as mildly enlarged and there was mild left ventricular hypertrophy. She underwent cardiac catheterization by Dr. Peter Swaziland, which showed diffuse 2 vessel coronary artery disease with moderate left ventricular dysfunction. Maximizing her medical treatment and stress testing was recommended.  She has not had any clinical recurrence of atrial fibrillation. She is currently receiving treatment with amiodarone but at a lower dose (the initial loading dose caused nausea). She has not had any bleeding problems. She has no history of stroke or tremors, ischemic attack or other embolic events. She has NYHA functional class II. She only expresses  symptoms of shortness of breath and very mild chest tightness when she hurries. She has taken nitroglycerin sublingually since her hospital discharge. She has been seen by cardiology in outpatient follow up, and it was felt that she would not be well served by percutaneous intervention due to her multifocal disease. She was subsequently referred to Dr. Tyrone Sage for cardiac surgical evaluation. Because of her progressive coronary disease and ongoing angina symptoms, it was felt that she would benefit from surgical revascularization. It was also felt that she would benefit from placement of a left  ventricular epicardial lead, in light of her left bundle branch block and decreased LV function. All risks, benefits and alternatives of surgery were explained in detail, and the patient agreed to proceed.      Hospital Course: The patient was admitted to [facility] on 09/28/2012. The patient was taken to the operating room and underwent the above procedure.    The postoperative course has been uneventful. She has remained in sinus rhythm, and has been restarted on low dose Amiodarone and Xarelto. Cardiology anticipates discontinuing Amiodarone in 30 days and checking a 30 day event monitor to determine if anticoagulants may be stopped. She has been mildly volume overloaded, and was started on Lasix, to which she has responded well. She is back to her baseline weight at present and Lasix has been discontinued. Her blood sugars are generally stable on home meds, and she will need outpatient follow up for her hemoglobin A1C of 9.1. She is afebrile and all vital signs are stable. She is ambulating in the halls without difficulty, and is tolerating a regular diet. Incisions are all healing well. She is presently medically stable for discharge on today's date.       Recent vital signs:  Filed Vitals:    10/01/12 0422   BP:  121/77   Pulse:  73   Temp:  98.6 F (37 C)   Resp:  18    Recent laboratory studies:  CBC:  Recent Labs   09/30/12 0315  10/01/12 0650   WBC  16.7*  14.7*   HGB  12.7  12.7   HCT  36.6  36.8   PLT  171  182    BMET:  Recent Labs   09/30/12 0315  10/01/12 0650   NA  136  131*   K  3.9  4.7   CL  100  97   CO2  27  27   GLUCOSE  98  186*   BUN  9  14   CREATININE  0.44*  0.56   CALCIUM  7.7*  8.1*    PT/INR:  Recent Labs   09/28/12 1350   LABPROT  18.2*   INR  1.55*         Discharge Medications:    Medication List     STOP taking these medications       isosorbide mononitrate 60 MG 24 hr tablet    Commonly known as: IMDUR     TAKE  these medications       amiodarone 200 MG tablet    Commonly known as: PACERONE    Take 1 tablet (200 mg total) by mouth daily.    aspirin 81 MG tablet    Take 1 tablet (81 mg total) by mouth daily.    atorvastatin 80 MG tablet    Commonly known as: LIPITOR    Take 1 tablet (80 mg total) by mouth daily.    carvedilol  12.5 MG tablet    Commonly known as: COREG    Take 1 tablet (12.5 mg total) by mouth 2 (two) times daily with a meal.    glimepiride 4 MG tablet    Commonly known as: AMARYL    Take 4 mg by mouth 2 (two) times daily.    losartan 25 MG tablet    Commonly known as: COZAAR    Take 2 tablets (50 mg total) by mouth 2 (two) times daily.    metFORMIN 1000 MG tablet    Commonly known as: GLUCOPHAGE    Take 1,000 mg by mouth 2 (two) times daily with a meal.    nitroGLYCERIN 0.4 MG SL tablet    Commonly known as: NITROSTAT    Place 1 tablet (0.4 mg total) under the tongue every 5 (five) minutes as needed for chest pain.    ONE TOUCH ULTRA SYSTEM KIT W/DEVICE Kit    1 kit by Does not apply route once.    ONETOUCH ULTRA BLUE VI    by In Vitro route daily.    oxyCODONE 5 MG immediate release tablet    Commonly known as: Oxy IR/ROXICODONE    Take 1-2 tablets (5-10 mg total) by mouth every 3 (three) hours as needed for pain.    Rivaroxaban 20 MG Tabs    Commonly known as: XARELTO    Take 1 tablet (20 mg total) by mouth daily with supper.      The patient has been discharged on:  1.Beta Blocker: Yes [ x ]  No [ ]   If No, reason:  2.Ace Inhibitor/ARB: Yes [ x ]  No [ ]   If No, reason:  3.Statin: Yes [ x ]  No [ ]   If No, reason:  4.Ecasa: Yes [ x ]  No [ ]   If No, reason:      Discharge Instructions: The patient is to refrain from driving, heavy lifting or strenuous activity. May shower daily and clean incisions with soap and water. May resume regular diet.      Follow Up:       Future Appointments  Provider  Department  Dept Phone    10/28/2012 9:30 AM   Thurmon Fair, MD  [facility] [place]  [phone]    11/25/2012 9:30 AM  Roderick Pee, MD  [facility]  [phone]      Follow-up Information    Follow up with Delight Ovens, MD On 10/22/2012. (Have a chest x-ray at [facility] at 3:00, then see MD at 4:00)    Contact information:    [address]  [address]  [place] [address]  [phone]       Follow up with Thurmon Fair, MD. Schedule an appointment as soon as possible for a visit in 2 weeks.    Contact information:    [address]  [address]  [place] [address]  [phone]      Meara Wiechman H  10/01/2012, 10:08 AM

## 2012-10-05 ENCOUNTER — Other Ambulatory Visit: Payer: Self-pay | Admitting: Physician Assistant

## 2012-10-05 MED ORDER — FUROSEMIDE 40 MG PO TABS: 40.0000 mg | ORAL_TABLET | Freq: Two times a day (BID) | ORAL | Status: DC

## 2012-10-05 MED ORDER — POTASSIUM CHLORIDE ER 20 MEQ PO TBCR: 20.0000 meq | EXTENDED_RELEASE_TABLET | Freq: Every day | ORAL | Status: DC

## 2012-10-17 ENCOUNTER — Telehealth: Payer: Self-pay | Admitting: Cardiology

## 2012-10-17 NOTE — Telephone Encounter (Signed)
Called by patient who had onset of palpitations this AM. Similar to sensation pre-operatively (MAZE and CABG done 3 weeks ago). Pulse feels fast. Feels

## 2012-10-17 NOTE — Telephone Encounter (Signed)
Continuation of earlier note: feels mildly short of breath.  Based upon history and symptoms, it appears that she is in an atrial tachyarrhythmia. On amiodarone and carvedilol and rivoxaban Suggested she could take carvedilol and amiodarone now and if arrhythmia does not terminate within 30 mins, she should come to ER ASAP. Other indications to call 911 detailed (syncope, chest pain, severe SOB). Patient verbalized understanding and agreeable to plan.  Will send to primary cardiologist as FYI.

## 2012-10-17 NOTE — Telephone Encounter (Signed)
Thank you for letting me know. Agree with your instructions

## 2012-10-19 ENCOUNTER — Telehealth: Payer: Self-pay | Admitting: Family Medicine

## 2012-10-19 ENCOUNTER — Other Ambulatory Visit: Payer: Self-pay | Admitting: *Deleted

## 2012-10-19 DIAGNOSIS — I2510 Atherosclerotic heart disease of native coronary artery without angina pectoris: Secondary | ICD-10-CM

## 2012-10-19 MED ORDER — GLUCOSE BLOOD VI STRP: 1.0000 | ORAL_STRIP | Freq: Every day | Status: DC

## 2012-10-19 NOTE — Telephone Encounter (Signed)
Rx sent to pharmacy   

## 2012-10-19 NOTE — Telephone Encounter (Signed)
PT states that she bought a new meter; one-touch ultra 2. She needs an RX for test strips called into [facility] on [place], [place]. Please assist.

## 2012-10-20 ENCOUNTER — Encounter: Payer: Self-pay | Admitting: Cardiothoracic Surgery

## 2012-10-20 ENCOUNTER — Ambulatory Visit
Admission: RE | Admit: 2012-10-20 | Discharge: 2012-10-20 | Disposition: A | Discharge status: Home / Self Care | Payer: BC Managed Care – PPO | Source: Ambulatory Visit | Attending: Cardiothoracic Surgery | Admitting: Cardiothoracic Surgery

## 2012-10-20 ENCOUNTER — Ambulatory Visit (INDEPENDENT_AMBULATORY_CARE_PROVIDER_SITE_OTHER): Payer: Self-pay | Admitting: Cardiothoracic Surgery

## 2012-10-20 VITALS — BP 115/87 | HR 77 | Resp 20 | Ht 64.0 in | Wt 158.0 lb

## 2012-10-20 DIAGNOSIS — I2510 Atherosclerotic heart disease of native coronary artery without angina pectoris: Secondary | ICD-10-CM

## 2012-10-20 DIAGNOSIS — Z9889 Other specified postprocedural states: Secondary | ICD-10-CM

## 2012-10-20 DIAGNOSIS — Z951 Presence of aortocoronary bypass graft: Secondary | ICD-10-CM

## 2012-10-20 DIAGNOSIS — Z8679 Personal history of other diseases of the circulatory system: Secondary | ICD-10-CM

## 2012-10-20 MED ORDER — POTASSIUM CHLORIDE ER 10 MEQ PO TBCR
10.0000 meq | EXTENDED_RELEASE_TABLET | Freq: Every day | ORAL | Status: DC
Start: 2012-10-20 — End: 2012-11-02

## 2012-10-20 MED ORDER — FUROSEMIDE 20 MG PO TABS: 20.0000 mg | ORAL_TABLET | Freq: Every day | ORAL | Status: DC

## 2012-10-20 MED ORDER — OXYCODONE HCL 5 MG PO TABS: 5.0000 mg | ORAL_TABLET | ORAL | Status: DC | PRN

## 2012-10-20 NOTE — Progress Notes (Signed)
[address]       [place] [address]             [phone]                  [name] [facility] Medical Record #[id number] Date of Birth: Aug 25, 1950  Thurmon Fair, MD Evette Georges, MD  Chief Complaint:   PostOp Follow Up Visit 09/28/2012  PREOPERATIVE DIAGNOSES: Coronary occlusive disease and paroxysmal  atrial fibrillation.  POSTOPERATIVE DIAGNOSES: Coronary occlusive disease and paroxysmal  atrial fibrillation.  SURGICAL PROCEDURE:  1. Coronary artery bypass grafting x4 with the left internal mammary  to the left anterior descending coronary artery sequential reverse  saphenous vein graft to the first and second obtuse marginal,  reverse saphenous vein graft to the intermediate coronary artery.  2. Left-sided maze procedure, placement of 35-mm left atrial clip and  placement of permanent left ventricular pacing lead.  3. Left leg endo vein harvesting.  SURGEON: Sheliah Plane, MD   History of Present Illness:     Patient returns to the office today for followup visit after recent cardiac surgery. She is slowly gaining strength. Has had several short episodes of palpitations that have spontaneously resolved. She still fatigues quickly.      History  Smoking status  . Former Smoker -- 1.00 packs/day for 10 years  . Types: Cigarettes  . Quit date: 03/11/2012  Smokeless tobacco  . Never Used    Comment: Quit in January       No Known Allergies  Current Outpatient Prescriptions  Medication Sig Dispense Refill  . amiodarone (PACERONE) 200 MG tablet Take 1 tablet (200 mg total) by mouth daily.  60 tablet  3  . aspirin 81 MG tablet Take 1 tablet (81 mg total) by mouth daily.  30 tablet    . atorvastatin (LIPITOR) 80 MG tablet Take 1 tablet (80 mg total) by mouth daily.  90 tablet  3  . Blood Glucose Monitoring Suppl (ONE TOUCH ULTRA SYSTEM KIT) W/DEVICE KIT 1 kit by Does not apply route once.        . carvedilol (COREG)  12.5 MG tablet Take 1 tablet (12.5 mg total) by mouth 2 (two) times daily with a meal.  60 tablet  1  . glimepiride (AMARYL) 4 MG tablet Take 4 mg by mouth 2 (two) times daily.      Marland Kitchen glucose blood test strip 1 each by Other route daily. One touch ultra 2 - dx 250.00  100 each  12  . losartan (COZAAR) 25 MG tablet Take 25 mg by mouth daily.      . metFORMIN (GLUCOPHAGE) 1000 MG tablet Take 1,000 mg by mouth 2 (two) times daily with a meal.      . nitroGLYCERIN (NITROSTAT) 0.4 MG SL tablet Place 1 tablet (0.4 mg total) under the tongue every 5 (five) minutes as needed for chest pain.  25 tablet  3  . oxyCODONE (OXY IR/ROXICODONE) 5 MG immediate release tablet Take 1-2 tablets (5-10 mg total) by mouth every 3 (three) hours as needed for pain.  30 tablet  0  . Rivaroxaban (XARELTO) 20 MG TABS Take 1 tablet (20 mg total) by mouth daily with supper.  30 tablet  6   No current facility-administered medications for this visit.       Physical Exam: BP 115/87  Pulse 77  Resp 20  Ht 5\' 4"  (1.626 m)  Wt 158 lb (71.668  kg)  BMI 27.11 kg/m2  SpO2 96%  General appearance: alert and cooperative Neurologic: intact Heart: regular rate and rhythm, S1, S2 normal, no murmur, click, rub or gallop Lungs: diminished breath sounds LLL Abdomen: soft, non-tender; bowel sounds normal; no masses,  no organomegaly Extremities: extremities normal, atraumatic, no cyanosis or edema and Homans sign is negative, no sign of DVT Wound: Patient sternal incision and leg incisions are healing well   Diagnostic Studies & Laboratory data:         Recent Radiology Findings: Dg Chest 2 View  10/20/2012   *RADIOLOGY REPORT*  Clinical Data: Cardiac surgery.  Prior tobacco use.  CHEST - 2 VIEW  Comparison: 10/01/2012  Findings: Moderate left pleural effusion with passive atelectasis. Disconnected epicardial pacer leads.  Atrial clip noted.  Prior CABG noted.  Heart size currently appears within normal limits.  Trace right  pleural effusion.  No edema.  IMPRESSION:  1.  Moderate left and trace right pleural effusions with passive atelectasis.  No edema or significant cardiomegaly.   Original Report Authenticated By: Gaylyn Rong, M.D.      Recent Labs: Lab Results  Component Value Date   WBC 11.4* 10/02/2012   HGB 11.3* 10/02/2012   HCT 33.9* 10/02/2012   PLT 188 10/02/2012   GLUCOSE 177* 10/02/2012   CHOL 144 07/18/2012   TRIG 530* 07/18/2012   HDL 39* 07/18/2012   LDLDIRECT 134.3 09/19/2009   LDLCALC UNABLE TO CALCULATE IF TRIGLYCERIDE OVER 400 mg/dL 8/46/9629   ALT 22 08/06/4130   AST 14 09/25/2012   NA 135 10/02/2012   K 4.1 10/02/2012   CL 98 10/02/2012   CREATININE 0.55 10/02/2012   BUN 11 10/02/2012   CO2 31 10/02/2012   TSH 2.447 07/18/2012   INR 1.55* 09/28/2012   HGBA1C 9.1* 09/25/2012   Rhythm strip in the office confirms sinus rhythm   Assessment / Plan:     Patient is progressing nicely following surgery, she has had brief episodes of palpitations that spontaneously resolved possibly atrial fibrillation, she continues on amiodarone and anticoagulation. Chest x-ray shows moderate left pleural effusion, I have started her on Lasix 20 mg a day and potassium 10 mEq a day for the next 10 days. We'll see her back in the office in 10 days with a followup chest x-ray, if there is no improvement in the effusion with diuresis we will hold her anticoagulation and do a left thoracentesis. Following her next appointment she should be ready to start in cardiac rehabilitation. She has not been back to see cardiology yet but has an appointment on August 20.       Samael Blades B 10/20/2012 4:24 PM

## 2012-10-21 ENCOUNTER — Other Ambulatory Visit: Payer: Self-pay | Admitting: Family Medicine

## 2012-10-22 ENCOUNTER — Ambulatory Visit: Payer: BC Managed Care – PPO | Admitting: Cardiothoracic Surgery

## 2012-10-27 ENCOUNTER — Other Ambulatory Visit: Payer: Self-pay | Admitting: *Deleted

## 2012-10-27 DIAGNOSIS — I2510 Atherosclerotic heart disease of native coronary artery without angina pectoris: Secondary | ICD-10-CM

## 2012-10-28 ENCOUNTER — Ambulatory Visit (INDEPENDENT_AMBULATORY_CARE_PROVIDER_SITE_OTHER): Payer: BC Managed Care – PPO | Admitting: Cardiovascular Disease

## 2012-10-28 ENCOUNTER — Encounter: Payer: Self-pay | Admitting: Cardiovascular Disease

## 2012-10-28 VITALS — BP 138/62 | HR 84 | Resp 16 | Ht 64.5 in | Wt 152.3 lb

## 2012-10-28 DIAGNOSIS — I480 Paroxysmal atrial fibrillation: Secondary | ICD-10-CM

## 2012-10-28 DIAGNOSIS — I10 Essential (primary) hypertension: Secondary | ICD-10-CM

## 2012-10-28 DIAGNOSIS — I2510 Atherosclerotic heart disease of native coronary artery without angina pectoris: Secondary | ICD-10-CM

## 2012-10-28 DIAGNOSIS — I4891 Unspecified atrial fibrillation: Secondary | ICD-10-CM

## 2012-10-28 DIAGNOSIS — Z951 Presence of aortocoronary bypass graft: Secondary | ICD-10-CM

## 2012-10-28 NOTE — Patient Instructions (Addendum)
Your physician has recommended you make the following change in your medication: reduce amiodarone to 200 mg once daily. Your physician recommends that you schedule a follow-up appointment in: 4 weeks.

## 2012-10-29 ENCOUNTER — Ambulatory Visit: Payer: Self-pay | Admitting: Cardiothoracic Surgery

## 2012-10-30 ENCOUNTER — Other Ambulatory Visit: Payer: Self-pay | Admitting: Cardiothoracic Surgery

## 2012-10-30 ENCOUNTER — Encounter: Payer: Self-pay | Admitting: Cardiothoracic Surgery

## 2012-10-30 ENCOUNTER — Ambulatory Visit (INDEPENDENT_AMBULATORY_CARE_PROVIDER_SITE_OTHER): Payer: Self-pay | Admitting: Cardiothoracic Surgery

## 2012-10-30 ENCOUNTER — Ambulatory Visit
Admission: RE | Admit: 2012-10-30 | Discharge: 2012-10-30 | Disposition: A | Discharge status: Home / Self Care | Payer: BC Managed Care – PPO | Source: Ambulatory Visit | Attending: Cardiothoracic Surgery | Admitting: Cardiothoracic Surgery

## 2012-10-30 VITALS — BP 108/64 | HR 90 | Temp 97.9°F | Resp 16 | Ht 64.0 in | Wt 158.0 lb

## 2012-10-30 DIAGNOSIS — Z8679 Personal history of other diseases of the circulatory system: Secondary | ICD-10-CM

## 2012-10-30 DIAGNOSIS — I2510 Atherosclerotic heart disease of native coronary artery without angina pectoris: Secondary | ICD-10-CM

## 2012-10-30 DIAGNOSIS — Z951 Presence of aortocoronary bypass graft: Secondary | ICD-10-CM

## 2012-10-30 DIAGNOSIS — Z9889 Other specified postprocedural states: Secondary | ICD-10-CM

## 2012-10-30 MED ORDER — CEPHALEXIN 500 MG PO CAPS: 500.0000 mg | ORAL_CAPSULE | Freq: Three times a day (TID) | ORAL | Status: DC

## 2012-10-30 NOTE — Progress Notes (Signed)
[address]       [place] [address]             [phone]                      Shalisha Clausing [facility] Medical Record #[id number] Date of Birth: [birth year]  Thurmon Fair, MD Evette Georges, MD  Chief Complaint:   PostOp Follow Up Visit 09/28/2012  PREOPERATIVE DIAGNOSES: Coronary occlusive disease and paroxysmal  atrial fibrillation.  POSTOPERATIVE DIAGNOSES: Coronary occlusive disease and paroxysmal  atrial fibrillation.  SURGICAL PROCEDURE:  1. Coronary artery bypass grafting x4 with the left internal mammary  to the left anterior descending coronary artery sequential reverse  saphenous vein graft to the first and second obtuse marginal,  reverse saphenous vein graft to the intermediate coronary artery.  2. Left-sided maze procedure, placement of 35-mm left atrial clip and  placement of permanent left ventricular pacing lead.  3. Left leg endo vein harvesting.  SURGEON: Sheliah Plane, MD   History of Present Illness:     Patient returns to the office today for followup visit after recent cardiac surgery. She was seen last week with a small pleural effusion and was started on Lasix. She comes in today because of redness noted in the midportion of her sternal incision extending to the left that started yesterday. She's had no fever chills.    History  Smoking status  . Former Smoker -- 1.00 packs/day for 10 years  . Types: Cigarettes  . Quit date: 03/11/2012  Smokeless tobacco  . Never Used    Comment: Quit in January       No Known Allergies  Current Outpatient Prescriptions  Medication Sig Dispense Refill  . amiodarone (PACERONE) 200 MG tablet Take 200 mg by mouth 2 (two) times daily.      Marland Kitchen aspirin 81 MG tablet Take 1 tablet (81 mg total) by mouth daily.  30 tablet    . atorvastatin (LIPITOR) 80 MG tablet Take 1 tablet (80 mg total) by mouth daily.  90 tablet  3  . Blood Glucose Monitoring Suppl (ONE TOUCH  ULTRA SYSTEM KIT) W/DEVICE KIT 1 kit by Does not apply route once.        . carvedilol (COREG) 12.5 MG tablet Take 1 tablet (12.5 mg total) by mouth 2 (two) times daily with a meal.  60 tablet  1  . furosemide (LASIX) 20 MG tablet Take 1 tablet (20 mg total) by mouth daily.  30 tablet  1  . glimepiride (AMARYL) 4 MG tablet Take 4 mg by mouth 2 (two) times daily.      Marland Kitchen glucose blood test strip 1 each by Other route daily. One touch ultra 2 - dx 250.00  100 each  12  . losartan (COZAAR) 25 MG tablet Take 25 mg by mouth 2 (two) times daily.       . metFORMIN (GLUCOPHAGE) 1000 MG tablet Take 1,000 mg by mouth 2 (two) times daily with a meal.      . metFORMIN (GLUCOPHAGE) 1000 MG tablet TAKE ONE TABLET BY MOUTH TWICE DAILY WITH MEALS - NEED TO SCHEDULE OFFICE VISIT  180 tablet  1  . nitroGLYCERIN (NITROSTAT) 0.4 MG SL tablet Place 1 tablet (0.4 mg total) under the tongue every 5 (five) minutes as needed for chest pain.  25 tablet  3  . potassium chloride (K-DUR) 10 MEQ tablet Take 1  tablet (10 mEq total) by mouth daily.  30 tablet  2  . Rivaroxaban (XARELTO) 20 MG TABS Take 1 tablet (20 mg total) by mouth daily with supper.  30 tablet  6  . cephALEXin (KEFLEX) 500 MG capsule Take 1 capsule (500 mg total) by mouth 3 (three) times daily.  21 capsule  0   No current facility-administered medications for this visit.       Physical Exam: BP 108/64  Pulse 90  Temp(Src) 97.9 F (36.6 C) (Oral)  Resp 16  Ht 5\' 4"  (1.626 m)  Wt 158 lb (71.668 kg)  BMI 27.11 kg/m2  SpO2 98%  General appearance: alert and cooperative Neurologic: intact Heart: regular rate and rhythm, S1, S2 normal, no murmur, click, rub or gallop Lungs: diminished breath sounds LLL Abdomen: soft, non-tender; bowel sounds normal; no masses,  no organomegaly Extremities: extremities normal, atraumatic, no cyanosis or edema and Homans sign is negative, no sign of DVT Wound: The patient has area of erythema at the midportion of her  sternal incision extending toward the left breast, she has bilateral breast implants these were not compromised during her surgery. There is a some tannish drainage from the incision.   Diagnostic Studies & Laboratory data:         Recent Radiology Findings: Dg Chest 2 View  10/30/2012   CLINICAL DATA:  Shortness of Breath, CABG.  EXAM: CHEST  2 VIEW  COMPARISON:  10/20/2012  FINDINGS: Prior CABG. Devitalized pacer wires are noted, unchanged. There is a small to moderate left pleural effusion, decreased since prior study. Decreasing left base atelectasis no effusion on the right. Heart is normal size. Small hiatal hernia. No acute bony abnormality.  IMPRESSION: Decreasing effusions and left base atelectasis.   Electronically Signed   By: Charlett Nose   On: 10/30/2012 13:00      Recent Labs: Lab Results  Component Value Date   WBC 11.4* 10/02/2012   HGB 11.3* 10/02/2012   HCT 33.9* 10/02/2012   PLT 188 10/02/2012   GLUCOSE 177* 10/02/2012   CHOL 144 07/18/2012   TRIG 530* 07/18/2012   HDL 39* 07/18/2012   LDLDIRECT 134.3 09/19/2009   LDLCALC UNABLE TO CALCULATE IF TRIGLYCERIDE OVER 400 mg/dL 9/81/1914   ALT 22 7/82/9562   AST 14 09/25/2012   NA 135 10/02/2012   K 4.1 10/02/2012   CL 98 10/02/2012   CREATININE 0.55 10/02/2012   BUN 11 10/02/2012   CO2 31 10/02/2012   TSH 2.447 07/18/2012   INR 1.55* 09/28/2012   HGBA1C 9.1* 09/25/2012   Rhythm strip in the office confirms sinus rhythm  Office procedure: The midportion of the patient's incision was prepped with Betadine and locally anesthetized and a half centimeter area was opened and probed with Q-tip. A small pocket extended toward the left breast but not seem to fall the sternal tissue. Cultures were obtained. The wound was packed NU gauze.   Assessment / Plan:   Superficial sternal wound infection cultures pending, the patient has been started on Keflex 500 mg by mouth Q8 hours pending the final cultures. Home health will be consulted to  start daily dressing changes Saturday and Sunday Patient will return to the office for wound check on Monday and to followup on the culture sent today. She is aware to call immediately should she have fever chills over the next 48 hours.        Temiloluwa Laredo B 10/30/2012 2:40 PM

## 2012-10-31 ENCOUNTER — Other Ambulatory Visit: Payer: Self-pay | Admitting: Family Medicine

## 2012-11-01 ENCOUNTER — Other Ambulatory Visit: Payer: Self-pay | Admitting: Family Medicine

## 2012-11-02 ENCOUNTER — Ambulatory Visit (INDEPENDENT_AMBULATORY_CARE_PROVIDER_SITE_OTHER): Payer: Self-pay | Admitting: Surgical

## 2012-11-02 VITALS — BP 110/70 | HR 82 | Resp 20 | Ht 64.0 in | Wt 158.0 lb

## 2012-11-02 DIAGNOSIS — Z5189 Encounter for other specified aftercare: Secondary | ICD-10-CM

## 2012-11-02 DIAGNOSIS — Z9889 Other specified postprocedural states: Secondary | ICD-10-CM

## 2012-11-02 DIAGNOSIS — Z951 Presence of aortocoronary bypass graft: Secondary | ICD-10-CM

## 2012-11-02 DIAGNOSIS — Z8679 Personal history of other diseases of the circulatory system: Secondary | ICD-10-CM

## 2012-11-02 DIAGNOSIS — I2510 Atherosclerotic heart disease of native coronary artery without angina pectoris: Secondary | ICD-10-CM

## 2012-11-02 LAB — CULTURE, ROUTINE-ABSCESS
Gram Stain: NONE SEEN
Gram Stain: NONE SEEN

## 2012-11-02 NOTE — Patient Instructions (Signed)
Continue to come to the office daily for dressing changes. The patient agrees to this.

## 2012-11-02 NOTE — Progress Notes (Signed)
[address]       [place] [address]             [phone]                  Belicia Difatta [facility] Medical Record #[id number] Date of Birth: 04-30-1950  Thurmon Fair, MD Evette Georges, MD  Chief Complaint:   PostOp Follow Up Visit   History of Present Illness:    The patient is seen today in office followup for her sternal wound. She has been getting daily packing with iodoform. She is on Keflex. The culture has grown staph aureus which is sensitive to cephalosporins. She is feeling well without fevers, chills or other constitutional symptoms.           History  Smoking status  . Former Smoker -- 1.00 packs/day for 10 years  . Types: Cigarettes  . Quit date: 03/11/2012  Smokeless tobacco  . Never Used    Comment: Quit in January       No Known Allergies  Current Outpatient Prescriptions  Medication Sig Dispense Refill  . amiodarone (PACERONE) 200 MG tablet Take 200 mg by mouth daily.       Marland Kitchen aspirin 81 MG tablet Take 1 tablet (81 mg total) by mouth daily.  30 tablet    . atorvastatin (LIPITOR) 80 MG tablet Take 1 tablet (80 mg total) by mouth daily.  90 tablet  3  . Blood Glucose Monitoring Suppl (ONE TOUCH ULTRA SYSTEM KIT) W/DEVICE KIT 1 kit by Does not apply route once.        . carvedilol (COREG) 12.5 MG tablet Take 1 tablet (12.5 mg total) by mouth 2 (two) times daily with a meal.  60 tablet  1  . cephALEXin (KEFLEX) 500 MG capsule Take 1 capsule (500 mg total) by mouth 3 (three) times daily.  21 capsule  0  . glimepiride (AMARYL) 4 MG tablet Take 4 mg by mouth 2 (two) times daily.      Marland Kitchen glucose blood test strip 1 each by Other route daily. One touch ultra 2 - dx 250.00  100 each  12  . losartan (COZAAR) 25 MG tablet Take 25 mg by mouth 2 (two) times daily.       . metFORMIN (GLUCOPHAGE) 1000 MG tablet TAKE ONE TABLET BY MOUTH TWICE DAILY WITH MEALS - NEED TO SCHEDULE OFFICE VISIT  180 tablet  1  . nitroGLYCERIN  (NITROSTAT) 0.4 MG SL tablet Place 1 tablet (0.4 mg total) under the tongue every 5 (five) minutes as needed for chest pain.  25 tablet  3  . Rivaroxaban (XARELTO) 20 MG TABS Take 1 tablet (20 mg total) by mouth daily with supper.  30 tablet  6   No current facility-administered medications for this visit.       Physical Exam: BP 110/70  Pulse 82  Resp 20  Ht 5\' 4"  (1.626 m)  Wt 158 lb (71.668 kg)  BMI 27.11 kg/m2  SpO2 98%  General appearance: alert, cooperative and no distress Wound: Moderate amount of pertinence on the dressing. The wound was repacked with iodoform. Wounds:  Diagnostic Studies & Laboratory data:         Recent Radiology Findings: No results found.    Recent Labs: Lab Results  Component Value Date   WBC 11.4* 10/02/2012   HGB 11.3* 10/02/2012   HCT 33.9* 10/02/2012   PLT 188 10/02/2012   GLUCOSE 177*  10/02/2012   CHOL 144 07/18/2012   TRIG 530* 07/18/2012   HDL 39* 07/18/2012   LDLDIRECT 134.3 09/19/2009   LDLCALC UNABLE TO CALCULATE IF TRIGLYCERIDE OVER 400 mg/dL 06/17/8117   ALT 22 1/47/8295   AST 14 09/25/2012   NA 135 10/02/2012   K 4.1 10/02/2012   CL 98 10/02/2012   CREATININE 0.55 10/02/2012   BUN 11 10/02/2012   CO2 31 10/02/2012   TSH 2.447 07/18/2012   INR 1.55* 09/28/2012   HGBA1C 9.1* 09/25/2012      Assessment / Plan:  Continue Keflex. She will continue daily dressing changes here at the office with close observation.          Gwyn Mehring E 11/02/2012 2:57 PM

## 2012-11-03 ENCOUNTER — Ambulatory Visit (INDEPENDENT_AMBULATORY_CARE_PROVIDER_SITE_OTHER): Payer: Self-pay | Admitting: *Deleted

## 2012-11-03 DIAGNOSIS — Z951 Presence of aortocoronary bypass graft: Secondary | ICD-10-CM

## 2012-11-03 DIAGNOSIS — Z5189 Encounter for other specified aftercare: Secondary | ICD-10-CM

## 2012-11-03 DIAGNOSIS — I2510 Atherosclerotic heart disease of native coronary artery without angina pectoris: Secondary | ICD-10-CM

## 2012-11-03 DIAGNOSIS — Z4800 Encounter for change or removal of nonsurgical wound dressing: Secondary | ICD-10-CM

## 2012-11-03 NOTE — Progress Notes (Signed)
Mrs. Khiev returns for wound care of her sternal wound.  The packing was removed and the area was cleansed with normal saline.  The wound bed is clean and there is a healthy bed of red tissue. A normal saline dampened 2x2 was packed inside the wound and then covered with a sterile dry dressing.  She will return tomorrow for repeat wound care.

## 2012-11-04 ENCOUNTER — Ambulatory Visit (INDEPENDENT_AMBULATORY_CARE_PROVIDER_SITE_OTHER): Payer: BC Managed Care – PPO | Admitting: Family Medicine

## 2012-11-04 ENCOUNTER — Encounter: Payer: Self-pay | Admitting: Cardiology

## 2012-11-04 ENCOUNTER — Ambulatory Visit (INDEPENDENT_AMBULATORY_CARE_PROVIDER_SITE_OTHER): Payer: BC Managed Care – PPO | Admitting: Cardiology

## 2012-11-04 ENCOUNTER — Ambulatory Visit (INDEPENDENT_AMBULATORY_CARE_PROVIDER_SITE_OTHER): Payer: Self-pay

## 2012-11-04 ENCOUNTER — Encounter: Payer: Self-pay | Admitting: Family Medicine

## 2012-11-04 VITALS — BP 130/70 | HR 84 | Ht 64.0 in | Wt 155.0 lb

## 2012-11-04 VITALS — BP 110/70 | Temp 98.7°F | Ht 64.0 in | Wt 156.0 lb

## 2012-11-04 DIAGNOSIS — I480 Paroxysmal atrial fibrillation: Secondary | ICD-10-CM

## 2012-11-04 DIAGNOSIS — Z0000 Encounter for general adult medical examination without abnormal findings: Secondary | ICD-10-CM

## 2012-11-04 DIAGNOSIS — I10 Essential (primary) hypertension: Secondary | ICD-10-CM

## 2012-11-04 DIAGNOSIS — Z951 Presence of aortocoronary bypass graft: Secondary | ICD-10-CM

## 2012-11-04 DIAGNOSIS — E1065 Type 1 diabetes mellitus with hyperglycemia: Secondary | ICD-10-CM

## 2012-11-04 DIAGNOSIS — I252 Old myocardial infarction: Secondary | ICD-10-CM

## 2012-11-04 DIAGNOSIS — I4891 Unspecified atrial fibrillation: Secondary | ICD-10-CM

## 2012-11-04 DIAGNOSIS — G8918 Other acute postprocedural pain: Secondary | ICD-10-CM

## 2012-11-04 DIAGNOSIS — Z4800 Encounter for change or removal of nonsurgical wound dressing: Secondary | ICD-10-CM

## 2012-11-04 DIAGNOSIS — E785 Hyperlipidemia, unspecified: Secondary | ICD-10-CM

## 2012-11-04 DIAGNOSIS — Z853 Personal history of malignant neoplasm of breast: Secondary | ICD-10-CM

## 2012-11-04 DIAGNOSIS — IMO0002 Reserved for concepts with insufficient information to code with codable children: Secondary | ICD-10-CM

## 2012-11-04 DIAGNOSIS — I2510 Atherosclerotic heart disease of native coronary artery without angina pectoris: Secondary | ICD-10-CM

## 2012-11-04 LAB — HM DIABETES FOOT EXAM: HM Diabetic Foot Exam: NORMAL

## 2012-11-04 MED ORDER — HYDROCODONE-ACETAMINOPHEN 5-325 MG PO TABS: 1.0000 | ORAL_TABLET | Freq: Four times a day (QID) | ORAL | Status: DC | PRN

## 2012-11-04 MED ORDER — AMIODARONE HCL 200 MG PO TABS: 200.0000 mg | ORAL_TABLET | Freq: Two times a day (BID) | ORAL | Status: DC

## 2012-11-04 NOTE — Assessment & Plan Note (Signed)
Controlled.  

## 2012-11-04 NOTE — Patient Instructions (Addendum)
Increase Amiodarone to 200 mg twice a day. We will leave at this dose for 3 months.  Then Dr. Royann Shivers will decrease.   Call if continuing episodes of rapid heart rate.  From our standpoint cardiac rehab.  Follow up with Dr. Royann Shivers at previous appt.

## 2012-11-04 NOTE — Progress Notes (Signed)
Chief Complaint  Patient presents with  . Annual Exam    HPI: [name] is a 62 yo patient of Dr. Tawanna Cooler with [facility] here for annual exam while PCP is out of the office.  Here for CPE:  -Concerns today:   1)DM: -takes metformin 1000mg  bid, amaryl 4mg  bid - not taking any insulin, on in past but she reports sheis not very good with taking care of her diabetes -home BS: running high chronically per her report 100s-300s, up and own -last eye exam: has not seen eye doctor in 3 years -last foot exam: today -denies: Lab Results  Component Value Date   HGBA1C 9.1* 09/25/2012  -uncontrolled DM for many years per [facility]; she is working on diet and getting ready to start exercise with cardiac rehab  2)CAD, HLD, PAF, Cardiomyopathy: -followed closely by cards, s/p CABG with MAZE recently -meds: amiodarone, asa, lipitor, coreg, losartan, xaralto -reports: recovering well, reports her cardiologist follows HLD, HTN, heart issues - reports cardiologist increased statin recently and she is doing well on this  Lab Results  Component Value Date   CHOL 144 07/18/2012   HDL 39* 07/18/2012   LDLCALC UNABLE TO CALCULATE IF TRIGLYCERIDE OVER 400 mg/dL 4/54/0981   LDLDIRECT 191.4 09/19/2009   TRIG 530* 07/18/2012   CHOLHDL 3.7 07/18/2012    -Diet: variety of foods, balance and well rounded - cardiac rehab  -Does not take vitamin D or calcium  -Exercise: no regular exercise - she has been referred to cardiology rehab  -Diabetes and Dyslipidemia Screening: see above  -Hx of HTN: followed by cardiology  -Vaccines: she refuses flu and shingles vaccine  -pap history: s/p complete hysterectomy  -FDLMP: N/A postmenopausal  -sexual activity: yes, female partner, no new partners  -wants STI testing: no  -Colon cancer screening: done 01/2011  -mammogram: last mammo - many years ago, reports total double mastectomy in the past and no mammo since  -Alcohol, Tobacco, drug use: see social history -  stopped smoking 6 months ago, no alcohol use  Review of Systems -   Past Medical History  Diagnosis Date  . Diabetes mellitus type II   . Hyperlipidemia   . Hypertension   . Allergic rhinitis   . Cataract   . CAD (coronary artery disease)     a. 2004: s/p MI in [place]. No PCI->Medical RX;  b. 07/2012 Cath: LM 30-40, LAD 70p, 70/51m, D1 80-90p, OM1 small 90p, OM2 large 80-90p, 70m, 70-80d, RCA 20-30 diff, EF 40%, glob HK.  . Ischemic cardiomyopathy     a. 07/2012 Echo: EF 35%, Sev inferoseptal HK, mildly dil LA, Peak PASP .  Marland Kitchen PAF (paroxysmal atrial fibrillation)     a. 07/2012: Amio and xarelto initiated.  Marland Kitchen LBBB (left bundle branch block)     a. intermittent - present during rapid afib 07/2012.  . Breast cancer 2002    Patient reports left breast cancer diagnosis in 2002 treated with bilateral mastectomy positive lymph nodes with left axillary dissection followed by chemotherapy of unknown type  . Neuromuscular disorder     Patient reports chronic numbness in the right foot related to previous surgery on the right leg and "nerve damage"    Past Surgical History  Procedure Laterality Date  . Mastectomy  2002    bilateral  . Tubal ligation  1987  . Bladder surgery  2000  . Right leg surgery  2001    torn ligaments and tendons x4 surgeries  . Appendectomy  1974  .  Cholecystectomy  1974  . Abdominal hysterectomy  2000  . Breast surgery      Bilateral mastectomy for left breast cancer unknown stage but patient reports positive nodes was treated with chemotherapy  . Abdominal hysterectomy    . Coronary artery bypass graft N/A 09/28/2012    Procedure: CORONARY ARTERY BYPASS GRAFTING (CABG);  Surgeon: Delight Ovens, MD;  Location: [facility] OR;  Service: Open Heart Surgery;  Laterality: N/A;  CABG x four, using left internal mammary artery and left leg greater saphenous vein harvested endoscopically  . Maze N/A 09/28/2012    Procedure: MAZE;  Surgeon: Delight Ovens, MD;   Location: [facility] OR;  Service: Open Heart Surgery;  Laterality: N/A;  . Intraoperative transesophageal echocardiogram N/A 09/28/2012    Procedure: INTRAOPERATIVE TRANSESOPHAGEAL ECHOCARDIOGRAM;  Surgeon: Delight Ovens, MD;  Location: [facility] OR;  Service: Open Heart Surgery;  Laterality: N/A;  . Epicardial pacing lead placement N/A 09/28/2012    Procedure: EPICARDIAL PACING LEAD PLACEMENT;  Surgeon: Delight Ovens, MD;  Location: [facility] OR;  Service: Thoracic;  Laterality: N/A;  LV LEAD PLACEMENT    Family History  Problem Relation Age of Onset  . Kidney failure Mother   . Heart disease Mother     Died mid 11s complications of diabetes  . Arthritis Other   . Cancer Other     colon, ovarian  . Diabetes Other   . Hyperlipidemia Other   . Kidney failure Other   . Colon cancer Sister     History   Social History  . Marital Status: Married    Spouse Name: N/A    Number of Children: 3  . Years of Education: N/A   Occupational History  . Office work    Social History Main Topics  . Smoking status: Former Smoker -- 1.00 packs/day for 10 years    Types: Cigarettes    Quit date: 03/11/2012  . Smokeless tobacco: Never Used     Comment: Quit in January  . Alcohol Use: No  . Drug Use: No  . Sexual Activity: None   Other Topics Concern  . None   Social History Narrative   Lives with husband and mother in [place].      Current outpatient prescriptions:amiodarone (PACERONE) 200 MG tablet, Take 200 mg by mouth daily. , Disp: , Rfl: ;  aspirin 81 MG tablet, Take 1 tablet (81 mg total) by mouth daily., Disp: 30 tablet, Rfl: ;  atorvastatin (LIPITOR) 80 MG tablet, Take 1 tablet (80 mg total) by mouth daily., Disp: 90 tablet, Rfl: 3;  Blood Glucose Monitoring Suppl (ONE TOUCH ULTRA SYSTEM KIT) W/DEVICE KIT, 1 kit by Does not apply route once.  , Disp: , Rfl:  carvedilol (COREG) 12.5 MG tablet, Take 1 tablet (12.5 mg total) by mouth 2 (two) times daily with a meal., Disp: 60 tablet, Rfl: 1;   cephALEXin (KEFLEX) 500 MG capsule, Take 1 capsule (500 mg total) by mouth 3 (three) times daily., Disp: 21 capsule, Rfl: 0;  glimepiride (AMARYL) 4 MG tablet, Take 4 mg by mouth 2 (two) times daily., Disp: , Rfl:  glucose blood test strip, 1 each by Other route daily. One touch ultra 2 - dx 250.00, Disp: 100 each, Rfl: 12;  losartan (COZAAR) 25 MG tablet, Take 25 mg by mouth 2 (two) times daily. , Disp: , Rfl: ;  metFORMIN (GLUCOPHAGE) 1000 MG tablet, TAKE ONE TABLET BY MOUTH TWICE DAILY WITH MEALS - NEED TO SCHEDULE OFFICE VISIT, Disp:  180 tablet, Rfl: 1 nitroGLYCERIN (NITROSTAT) 0.4 MG SL tablet, Place 1 tablet (0.4 mg total) under the tongue every 5 (five) minutes as needed for chest pain., Disp: 25 tablet, Rfl: 3;  Rivaroxaban (XARELTO) 20 MG TABS, Take 1 tablet (20 mg total) by mouth daily with supper., Disp: 30 tablet, Rfl: 6  EXAM:  Filed Vitals:   11/04/12 0911  BP: 110/70  Temp: 98.7 F (37.1 C)    GENERAL: vitals reviewed and listed below, alert, oriented, appears well hydrated and in no acute distress  HEENT: head atraumatic, PERRLA, normal appearance of eyes, ears, nose and mouth. moist mucus membranes.  NECK: supple, no masses or lymphadenopathy  LUNGS: clear to auscultation bilaterally, no rales, rhonchi or wheeze  CV: HRRR, no peripheral edema or cyanosis, normal pedal pulses  BREAST: refused  ABDOMEN: bowel sounds normal, soft, non tender to palpation, no masses, no rebound or guarding  GU: refused  RECTAL: refused  SKIN: no rash or abnormal - healing surgical scars   FOOT EXAM: doing well  MS: normal gait, moves all extremities normally  NEURO: CN II-XII grossly intact, normal muscle strength and sensation to light touch on extremities  PSYCH: normal affect, pleasant and cooperative  ASSESSMENT AND PLAN:  Discussed the following assessment and plan:  DIABETES MELLITUS TYPE 1-UNCONTROLLED - Plan: Ambulatory referral to  Endocrinology  HYPERLIPIDEMIA  HYPERTENSION  MYOCARDIAL INFARCTION, HX OF  BREAST CANCER, HX OF  CAD (coronary artery disease)  -Discussed and advised all [place] preventive services health task force level A and B recommendations for age, sex and risks.  -Advised at least 150 minutes of exercise per week and a healthy diet low in saturated fats and sweets and consisting of fresh fruits and vegetables, lean meats such as fish and white chicken and whole grains.  -labs, studies and vaccines per orders this encounter  Orders Placed This Encounter  Procedures  . Ambulatory referral to Endocrinology    Referral Priority:  Routine    Referral Type:  Consultation    Referral Reason:  Specialty Services Required    Number of Visits Requested:  1    Patient Instructions  We recommend the following healthy lifestyle measures: - eat a healthy diet consisting of lots of vegetables, fruits, beans, nuts, seeds, healthy meats such as white chicken and fish and whole grains.  - avoid fried foods, fast food, processed foods, sodas, red meet and other fattening foods.  - get a least 150 minutes of aerobic exercise per week if approved by cardiologist.   -We placed a referral for you as discussed to the endocrinologist. It usually takes about 1-2 weeks to process and schedule this referral. If you have not heard from [place] regarding this appointment in 2 weeks please contact our office.  -schedule eye exam - need to do once yearly  -call us for nurse visit if you change your mind about the flu or shingles vaccine  -follow up with your cardiologist as scheduled for your heart disease, cholesterol and blood pressure  -check your feet on a regular basis  -follow up with your doctor in 3 months      Patient advised to return to clinic immediately if symptoms worsen or persist or new concerns.   No Follow-up on file.  Kriste Basque R.

## 2012-11-04 NOTE — Progress Notes (Signed)
[name] returned today for sternal wound dressing change.  The 4x4 gauze covering had serosanguinous    Drainage present, then removed packing and cleaned area with NS. RE-packed with  dampened 2x2 gauze, I was only able to pack wound with about 3/4 of the 2x2 gauze. Covered with a sterile dry 4x4 gauze and tape. No signs of infection and pt tolerated well. She did request RX for pain medication prn. She will returned tomorrow for sternal wound dressing change.

## 2012-11-04 NOTE — Assessment & Plan Note (Signed)
Pt s/p CABG and Maze procdure. She did have PAF post op.  She is on Xarelto and Amiodarone, with Amiodarone decreased 10/28/12 to 200 mg daily.  Now with break through atrial fib.  Total of 5 episodes since decreasing dose.  All have been before bed, though this am was while she was dressing. + Chest pressure and SOB with each episode.  Discussed with Dr. Royann Shivers  To decide if we should increase amiodarone vs. Coreg.  We will increase amiodarone to 200 mg BID.  We will leave for 3 full months post CABG and Maze.  She will call if continued episodes of a. Fib.  She will follow up with Dr. Royann Shivers as scheduled.

## 2012-11-04 NOTE — Assessment & Plan Note (Signed)
Post infection improving.  Still on Keflex.  From our standpoint OK to proceed with Cardiac rehab.

## 2012-11-04 NOTE — Patient Instructions (Signed)
We recommend the following healthy lifestyle measures: - eat a healthy diet consisting of lots of vegetables, fruits, beans, nuts, seeds, healthy meats such as white chicken and fish and whole grains.  - avoid fried foods, fast food, processed foods, sodas, red meet and other fattening foods.  - get a least 150 minutes of aerobic exercise per week if approved by cardiologist.   -We placed a referral for you as discussed to the endocrinologist. It usually takes about 1-2 weeks to process and schedule this referral. If you have not heard from [place] regarding this appointment in 2 weeks please contact our office.  -schedule eye exam - need to do once yearly  -call us for nurse visit if you change your mind about the flu or shingles vaccine  -follow up with your cardiologist as scheduled for your heart disease, cholesterol and blood pressure  -check your feet on a regular basis  -follow up with your doctor in 3 months

## 2012-11-04 NOTE — Progress Notes (Signed)
Patient ID: [name], female   DOB: [birth year], 61 y.o.   MRN: [id number]       11/04/2012   PCP: Evette Georges, MD   Chief Complaint  Patient presents with  . Atrial Flutter    states had futtering this mornung for 30 min and made her sob    Primary Cardiologist: Dr. Royann Shivers  HPI: 62 y.o. female with a history of coronary artery disease, previously evaluated in [place] around 10 years ago. She was recently admitted to [facility] with atrial fibrillation and rapid ventricular response. She had a new onset left bundle branch block and there was initial concern about ventricular tachycardia. She converted to normal sinus rhythm with treatment with amiodarone.  She underwent cardiac catheterization, found to have significant coronary artery disease and underwent bypass grafting with LIMA to the LAD, vein graft-spine 1-on 2, and graft-intermediate. She also had a left-sided maze procedure. They also placed a left atrial appendage clip.  She has postop atrial fib and was placed on amiodarone and Xarelto. Since then she's done well with occasional episode of atrial fib, short-lived and she does have a sternal infection that is improving.  Surgeons have not yet cleared her for cardiac rehabilitation.  She presents today secondary to PAF. On her last visit August 2003 amiodarone was decreased from 200 twice a day to once daily. Since that time she's had 5 episodes of paroxysmal A. fib they have all occurred in the evening except for today was early in the morning when she was getting ready for the day.  All episodes occurred with fast heart rate chest pressure and shortness of breath. The episode lasted about 30 minutes.  She called and asked to be seen. Currently she has no complaints no chest pain or shortness of breath. EKG was done with chronic left bundle branch block sinus rhythm.   No Known Allergies  Current Outpatient Prescriptions  Medication Sig Dispense Refill  .  amiodarone (PACERONE) 200 MG tablet Take 200 mg by mouth daily.       Marland Kitchen aspirin 81 MG tablet Take 1 tablet (81 mg total) by mouth daily.  30 tablet    . atorvastatin (LIPITOR) 80 MG tablet Take 1 tablet (80 mg total) by mouth daily.  90 tablet  3  . Blood Glucose Monitoring Suppl (ONE TOUCH ULTRA SYSTEM KIT) W/DEVICE KIT 1 kit by Does not apply route once.        . carvedilol (COREG) 12.5 MG tablet Take 1 tablet (12.5 mg total) by mouth 2 (two) times daily with a meal.  60 tablet  1  . cephALEXin (KEFLEX) 500 MG capsule Take 1 capsule (500 mg total) by mouth 3 (three) times daily.  21 capsule  0  . glimepiride (AMARYL) 4 MG tablet Take 4 mg by mouth 2 (two) times daily.      Marland Kitchen glucose blood test strip 1 each by Other route daily. One touch ultra 2 - dx 250.00  100 each  12  . HYDROcodone-acetaminophen (NORCO/VICODIN) 5-325 MG per tablet Take 1 tablet by mouth every 6 (six) hours as needed for pain.  40 tablet  0  . losartan (COZAAR) 25 MG tablet Take 25 mg by mouth 2 (two) times daily.       . metFORMIN (GLUCOPHAGE) 1000 MG tablet TAKE ONE TABLET BY MOUTH TWICE DAILY WITH MEALS - NEED TO SCHEDULE OFFICE VISIT  180 tablet  1  . nitroGLYCERIN (NITROSTAT) 0.4 MG SL tablet Place 1 tablet (  0.4 mg total) under the tongue every 5 (five) minutes as needed for chest pain.  25 tablet  3  . Rivaroxaban (XARELTO) 20 MG TABS Take 1 tablet (20 mg total) by mouth daily with supper.  30 tablet  6   No current facility-administered medications for this visit.    Past Medical History  Diagnosis Date  . Diabetes mellitus type II   . Hyperlipidemia   . Hypertension   . Allergic rhinitis   . Cataract   . CAD (coronary artery disease)     a. 2004: s/p MI in [place]. No PCI->Medical RX;  b. 07/2012 Cath: LM 30-40, LAD 70p, 70/37m, D1 80-90p, OM1 small 90p, OM2 large 80-90p, 16m, 70-80d, RCA 20-30 diff, EF 40%, glob HK.s/p CABG  . Ischemic cardiomyopathy     a. 07/2012 Echo: EF 35%, Sev inferoseptal HK, mildly dil  LA, Peak PASP .  Marland Kitchen PAF (paroxysmal atrial fibrillation)     a. 07/2012: Amio and xarelto initiated.  Marland Kitchen LBBB (left bundle branch block)     a. intermittent - present during rapid afib 07/2012.  . Breast cancer 2002    Patient reports left breast cancer diagnosis in 2002 treated with bilateral mastectomy positive lymph nodes with left axillary dissection followed by chemotherapy of unknown type  . Neuromuscular disorder     Patient reports chronic numbness in the right foot related to previous surgery on the right leg and "nerve damage"    Past Surgical History  Procedure Laterality Date  . Mastectomy  2002    bilateral  . Tubal ligation  1987  . Bladder surgery  2000  . Right leg surgery  2001    torn ligaments and tendons x4 surgeries  . Appendectomy  1974  . Cholecystectomy  1974  . Abdominal hysterectomy  2000  . Breast surgery      Bilateral mastectomy for left breast cancer unknown stage but patient reports positive nodes was treated with chemotherapy  . Abdominal hysterectomy    . Coronary artery bypass graft N/A 09/28/2012    Procedure: CORONARY ARTERY BYPASS GRAFTING (CABG);  Surgeon: Delight Ovens, MD;  Location: [facility] OR;  Service: Open Heart Surgery;  Laterality: N/A;  CABG x four, using left internal mammary artery and left leg greater saphenous vein harvested endoscopically  . Maze N/A 09/28/2012    Procedure: MAZE;  Surgeon: Delight Ovens, MD;  Location: [facility] OR;  Service: Open Heart Surgery;  Laterality: N/A;  . Intraoperative transesophageal echocardiogram N/A 09/28/2012    Procedure: INTRAOPERATIVE TRANSESOPHAGEAL ECHOCARDIOGRAM;  Surgeon: Delight Ovens, MD;  Location: [facility] OR;  Service: Open Heart Surgery;  Laterality: N/A;  . Epicardial pacing lead placement N/A 09/28/2012    Procedure: EPICARDIAL PACING LEAD PLACEMENT;  Surgeon: Delight Ovens, MD;  Location: [facility] OR;  Service: Thoracic;  Laterality: N/A;  LV LEAD PLACEMENT    XLK:GMWNUUV:OZ colds or  fevers, no weight changes Skin:no rashes or ulcers HEENT:no blurred vision, no congestion CV:see HPI PUL:see HPI GI:no diarrhea constipation or melena, no indigestion GU:no hematuria, no dysuria MS:no joint pain, no claudication Neuro:no syncope, no lightheadedness Endo:+ diabetes, no thyroid disease  PHYSICAL EXAM BP 130/70  Pulse 84  Ht 5\' 4"  (1.626 m)  Wt 70.308 kg (155 lb)  BMI 26.59 kg/m2 General:Pleasant affect, NAD Skin:Warm and dry, brisk capillary refill HEENT:normocephalic, sclera clear, mucus membranes moist Neck:supple, no JVD, no bruits  Heart:S1S2 RRR without murmur, gallup, rub or click Chest wall with dressing in place.  Lungs:clear without rales, rhonchi, or wheezes ZOX:WRUE, non tender, + BS, do not palpate liver spleen or masses Ext:no lower ext edema, 2+ pedal pulses, 2+ radial pulses Neuro:alert and oriented, MAE, follows commands, + facial symmetry  EKG:SR LBBB rate 84.  ASSESSMENT AND PLAN PAF (paroxysmal atrial fibrillation) Pt s/p CABG and Maze procdure. She did have PAF post op.  She is on Xarelto and Amiodarone, with Amiodarone decreased 10/28/12 to 200 mg daily.  Now with break through atrial fib.  Total of 5 episodes since decreasing dose.  All have been before bed, though this am was while she was dressing. + Chest pressure and SOB with each episode.  Discussed with Dr. Royann Shivers  To decide if we should increase amiodarone vs. Coreg.  We will increase amiodarone to 200 mg BID.  We will leave for 3 full months post CABG and Maze.  She will call if continued episodes of a. Fib.  She will follow up with Dr. Royann Shivers as scheduled.     S/P CABG x 4: 09/28/12 (LIMA-LAD, SVG-OM1-OM2, SVG-Intermediate) Post infection improving.  Still on Keflex.  From our standpoint OK to proceed with Cardiac rehab.   HYPERTENSION Controlled.

## 2012-11-05 ENCOUNTER — Encounter (INDEPENDENT_AMBULATORY_CARE_PROVIDER_SITE_OTHER): Payer: Self-pay

## 2012-11-05 DIAGNOSIS — I2510 Atherosclerotic heart disease of native coronary artery without angina pectoris: Secondary | ICD-10-CM

## 2012-11-06 ENCOUNTER — Encounter: Payer: Self-pay | Admitting: Cardiovascular Disease

## 2012-11-06 ENCOUNTER — Ambulatory Visit (INDEPENDENT_AMBULATORY_CARE_PROVIDER_SITE_OTHER): Payer: BC Managed Care – PPO | Admitting: *Deleted

## 2012-11-06 DIAGNOSIS — Z4800 Encounter for change or removal of nonsurgical wound dressing: Secondary | ICD-10-CM

## 2012-11-06 DIAGNOSIS — Z951 Presence of aortocoronary bypass graft: Secondary | ICD-10-CM

## 2012-11-06 DIAGNOSIS — I2510 Atherosclerotic heart disease of native coronary artery without angina pectoris: Secondary | ICD-10-CM

## 2012-11-06 NOTE — Progress Notes (Deleted)
She returns for dressing change to her mid sternal wound.  It continues to heal slowly with a good red wound base.  There is no sign of infection.  Hydrogel was applied today followed by a n/s dampened portion of a 2x2, then a dry dressing.

## 2012-11-09 ENCOUNTER — Encounter: Payer: Self-pay | Admitting: Cardiovascular Disease

## 2012-11-09 NOTE — Assessment & Plan Note (Signed)
She admits that this surgery "kicked her butt" and finds that it is harder to recover from than she had anticipated. Nevertheless she is making good progress. She has not had any problems with congestive heart failure. It sounds like she is having occasional bursts of arrhythmia, but this is to be expected for the next several weeks. Cardiac rehabilitation should help.

## 2012-11-09 NOTE — Progress Notes (Signed)
Patient ID: [name], female   DOB: 1950-08-18, 62 y.o.   MRN: [id number]     Reason for office visit Followup after CABG, surgical maze  [name] is recovering slowly from four-vessel bypass surgery, left atrial appendage clipping, surgical Maze and elective placement of a left ventricular epicardial lead. She has had rare and brief episodes of rapid palpitations. She denies shortness of breath. Her small pleural effusion seems to have resolved with diuretic therapy. Her major complaint is fatigue . She is slowly recovering and readily admits that recovery from the surgery was harder than she anticipated. She has a small area of superficial surgical wound MSSA infection She is beginning to start cardiac rehabilitation.    No Known Allergies  Current Outpatient Prescriptions  Medication Sig Dispense Refill  . aspirin 81 MG tablet Take 1 tablet (81 mg total) by mouth daily.  30 tablet    . atorvastatin (LIPITOR) 80 MG tablet Take 1 tablet (80 mg total) by mouth daily.  90 tablet  3  . carvedilol (COREG) 12.5 MG tablet Take 1 tablet (12.5 mg total) by mouth 2 (two) times daily with a meal.  60 tablet  1  . glimepiride (AMARYL) 4 MG tablet Take 4 mg by mouth 2 (two) times daily.      Marland Kitchen glucose blood test strip 1 each by Other route daily. One touch ultra 2 - dx 250.00  100 each  12  . losartan (COZAAR) 25 MG tablet Take 25 mg by mouth 2 (two) times daily.       . metFORMIN (GLUCOPHAGE) 1000 MG tablet TAKE ONE TABLET BY MOUTH TWICE DAILY WITH MEALS - NEED TO SCHEDULE OFFICE VISIT  180 tablet  1  . nitroGLYCERIN (NITROSTAT) 0.4 MG SL tablet Place 1 tablet (0.4 mg total) under the tongue every 5 (five) minutes as needed for chest pain.  25 tablet  3  . Rivaroxaban (XARELTO) 20 MG TABS Take 1 tablet (20 mg total) by mouth daily with supper.  30 tablet  6  . amiodarone (PACERONE) 200 MG tablet Take 1 tablet (200 mg total) by mouth 2 (two) times daily.  60 tablet  3  . Blood Glucose Monitoring  Suppl (ONE TOUCH ULTRA SYSTEM KIT) W/DEVICE KIT 1 kit by Does not apply route once.        . cephALEXin (KEFLEX) 500 MG capsule Take 1 capsule (500 mg total) by mouth 3 (three) times daily.  21 capsule  0  . HYDROcodone-acetaminophen (NORCO/VICODIN) 5-325 MG per tablet Take 1 tablet by mouth every 6 (six) hours as needed for pain.  40 tablet  0   No current facility-administered medications for this visit.    Past Medical History  Diagnosis Date  . Diabetes mellitus type II   . Hyperlipidemia   . Hypertension   . Allergic rhinitis   . Cataract   . CAD (coronary artery disease)     a. 2004: s/p MI in [place]. No PCI->Medical RX;  b. 07/2012 Cath: LM 30-40, LAD 70p, 70/30m, D1 80-90p, OM1 small 90p, OM2 large 80-90p, 23m, 70-80d, RCA 20-30 diff, EF 40%, glob HK.s/p CABG  . Ischemic cardiomyopathy     a. 07/2012 Echo: EF 35%, Sev inferoseptal HK, mildly dil LA, Peak PASP .  Marland Kitchen PAF (paroxysmal atrial fibrillation)     a. 07/2012: Amio and xarelto initiated.  Marland Kitchen LBBB (left bundle branch block)     a. intermittent - present during rapid afib 07/2012.  . Breast cancer  2002    Patient reports left breast cancer diagnosis in 2002 treated with bilateral mastectomy positive lymph nodes with left axillary dissection followed by chemotherapy of unknown type  . Neuromuscular disorder     Patient reports chronic numbness in the right foot related to previous surgery on the right leg and "nerve damage"    Past Surgical History  Procedure Laterality Date  . Mastectomy  2002    bilateral  . Tubal ligation  1987  . Bladder surgery  2000  . Right leg surgery  2001    torn ligaments and tendons x4 surgeries  . Appendectomy  1974  . Cholecystectomy  1974  . Abdominal hysterectomy  2000  . Breast surgery      Bilateral mastectomy for left breast cancer unknown stage but patient reports positive nodes was treated with chemotherapy  . Abdominal hysterectomy    . Coronary artery bypass graft N/A  09/28/2012    Procedure: CORONARY ARTERY BYPASS GRAFTING (CABG);  Surgeon: Delight Ovens, MD;  Location: [facility] OR;  Service: Open Heart Surgery;  Laterality: N/A;  CABG x four, using left internal mammary artery and left leg greater saphenous vein harvested endoscopically  . Maze N/A 09/28/2012    Procedure: MAZE;  Surgeon: Delight Ovens, MD;  Location: [facility] OR;  Service: Open Heart Surgery;  Laterality: N/A;  . Intraoperative transesophageal echocardiogram N/A 09/28/2012    Procedure: INTRAOPERATIVE TRANSESOPHAGEAL ECHOCARDIOGRAM;  Surgeon: Delight Ovens, MD;  Location: [facility] OR;  Service: Open Heart Surgery;  Laterality: N/A;  . Epicardial pacing lead placement N/A 09/28/2012    Procedure: EPICARDIAL PACING LEAD PLACEMENT;  Surgeon: Delight Ovens, MD;  Location: [facility] OR;  Service: Thoracic;  Laterality: N/A;  LV LEAD PLACEMENT    Family History  Problem Relation Age of Onset  . Kidney failure Mother   . Heart disease Mother     Died mid 63s complications of diabetes  . Arthritis Other   . Cancer Other     colon, ovarian  . Diabetes Other   . Hyperlipidemia Other   . Kidney failure Other   . Colon cancer Sister     History   Social History  . Marital Status: Married    Spouse Name: N/A    Number of Children: 3  . Years of Education: N/A   Occupational History  . Office work    Social History Main Topics  . Smoking status: Former Smoker -- 1.00 packs/day for 10 years    Types: Cigarettes    Quit date: 03/11/2012  . Smokeless tobacco: Never Used     Comment: Quit in January  . Alcohol Use: No  . Drug Use: No  . Sexual Activity: Not on file   Other Topics Concern  . Not on file   Social History Narrative   Lives with husband and mother in law.      Review of systems: As some surgical wound soreness, mild shortness of breath, occasional episodes of rapid palpitations without other complaints The patient specifically denies any angina at rest or with exertion,  dyspnea at rest, orthopnea, paroxysmal nocturnal dyspnea, syncope, focal neurological deficits, intermittent claudication, lower extremity edema, unexplained weight gain, cough, hemoptysis or wheezing.  The patient also denies abdominal pain, nausea, vomiting, dysphagia, diarrhea, constipation, polyuria, polydipsia, dysuria, hematuria, frequency, urgency, abnormal bleeding or bruising, fever, chills, unexpected weight changes, mood swings, change in skin or hair texture, change in voice quality, auditory or visual problems, allergic reactions or  rashes, new musculoskeletal complaints other than usual "aches and pains".   PHYSICAL EXAM BP 138/62  Pulse 84  Resp 16  Ht 5' 4.5" (1.638 m)  Wt 152 lb 4.8 oz (69.083 kg)  BMI 25.75 kg/m2  General: Alert, oriented x3, no distress Head: no evidence of trauma, PERRL, EOMI, no exophtalmos or lid lag, no myxedema, no xanthelasma; normal ears, nose and oropharynx Neck: normal jugular venous pulsations and no hepatojugular reflux; brisk carotid pulses without delay and no carotid bruits Chest: clear to auscultation, no signs of consolidation by percussion or palpation, normal fremitus, symmetrical and full respiratory excursions, most of her sternotomy scar is well-healed but there is a 2-3 cm segment of redness and mild swelling Cardiovascular: normal position and quality of the apical impulse, regular rhythm, normal first and second heart sounds, no murmurs, rubs or gallops Abdomen: no tenderness or distention, no masses by palpation, no abnormal pulsatility or arterial bruits, normal bowel sounds, no hepatosplenomegaly Extremities: no clubbing, cyanosis or edema; 2+ radial, ulnar and brachial pulses bilaterally; 2+ right femoral, posterior tibial and dorsalis pedis pulses; 2+ left femoral, posterior tibial and dorsalis pedis pulses; no subclavian or femoral bruits Neurological: grossly nonfocal   EKG: Normal sinus rhythm, left bundle branch  block  Lipid Panel     Component Value Date/Time   CHOL 144 07/18/2012 0500   TRIG 530* 07/18/2012 0500   HDL 39* 07/18/2012 0500   CHOLHDL 3.7 07/18/2012 0500   VLDL UNABLE TO CALCULATE IF TRIGLYCERIDE OVER 400 mg/dL 1/61/0960 4540   LDLCALC UNABLE TO CALCULATE IF TRIGLYCERIDE OVER 400 mg/dL 9/81/1914 7829    BMET    Component Value Date/Time   NA 135 10/02/2012 0410   K 4.1 10/02/2012 0410   CL 98 10/02/2012 0410   CO2 31 10/02/2012 0410   GLUCOSE 177* 10/02/2012 0410   BUN 11 10/02/2012 0410   CREATININE 0.55 10/02/2012 0410   CALCIUM 7.7* 10/02/2012 0410   GFRNONAA >90 10/02/2012 0410   GFRAA >90 10/02/2012 0410     ASSESSMENT AND PLAN S/P CABG x 4: 09/28/12 (LIMA-LAD, SVG-OM1-OM2, SVG-Intermediate) She admits that this surgery "kicked her butt" and finds that it is harder to recover from than she had anticipated. Nevertheless she is making good progress. She has not had any problems with congestive heart failure. It sounds like she is having occasional bursts of arrhythmia, but this is to be expected for the next several weeks. Cardiac rehabilitation should help.  PAF (paroxysmal atrial fibrillation) Also she is having some occasional bursts of arrhythmia but today is normal sinus rhythm. Reassess need for chronic anticoagulation with a minimum of a 30 day event monitor no sooner than 3 months after the Maze procedure. Today we will reduce the amiodarone to 200 mg once daily  HYPERTENSION Moderate to severe hypertriglyceridemia with low HDL cholesterol and relatively normal total and LDL cholesterol suggests a pattern of insulin resistance/small dense LDL cholesterol. Reevaluate on statin therapy at least 6 weeks following bypass surgery. May need to add fenofibrate, but would prefer to do so after she stops amiodarone to reduce her risk of liver toxicity  No orders of the defined types were placed in this encounter.   Meds ordered this encounter  Medications  . DISCONTD: amiodarone  (PACERONE) 200 MG tablet    Sig: Take 200 mg by mouth daily.     Junious Silk, MD, [facility] [phone] office [phone] pager

## 2012-11-09 NOTE — Assessment & Plan Note (Signed)
Moderate to severe hypertriglyceridemia with low HDL cholesterol and relatively normal total and LDL cholesterol suggests a pattern of insulin resistance/small dense LDL cholesterol. Reevaluate on statin therapy at least 6 weeks following bypass surgery. May need to add fenofibrate, but would prefer to do so after she stops amiodarone to reduce her risk of liver toxicity

## 2012-11-09 NOTE — Assessment & Plan Note (Addendum)
Also she is having some occasional bursts of arrhythmia but today is normal sinus rhythm. Reassess need for chronic anticoagulation with a minimum of a 30 day event monitor no sooner than 3 months after the Maze procedure. Today we will reduce the amiodarone to 200 mg once daily

## 2012-11-10 ENCOUNTER — Ambulatory Visit (INDEPENDENT_AMBULATORY_CARE_PROVIDER_SITE_OTHER): Payer: BC Managed Care – PPO | Admitting: *Deleted

## 2012-11-10 ENCOUNTER — Other Ambulatory Visit: Payer: Self-pay | Admitting: *Deleted

## 2012-11-10 DIAGNOSIS — I2510 Atherosclerotic heart disease of native coronary artery without angina pectoris: Secondary | ICD-10-CM

## 2012-11-10 DIAGNOSIS — Z951 Presence of aortocoronary bypass graft: Secondary | ICD-10-CM

## 2012-11-10 DIAGNOSIS — Z4800 Encounter for change or removal of nonsurgical wound dressing: Secondary | ICD-10-CM

## 2012-11-10 NOTE — Progress Notes (Signed)
She returns for care of her mid sternal open wound.  It is very slow to heal, but continues to have a good granulation base with no signs of infection.  The are was cleansed with normal saline, packed with dampened 4/4 and covered with a dry dressing. She will return tomorrow for same.

## 2012-11-11 ENCOUNTER — Encounter (INDEPENDENT_AMBULATORY_CARE_PROVIDER_SITE_OTHER): Payer: BC Managed Care – PPO

## 2012-11-11 DIAGNOSIS — I2510 Atherosclerotic heart disease of native coronary artery without angina pectoris: Secondary | ICD-10-CM

## 2012-11-12 ENCOUNTER — Encounter (INDEPENDENT_AMBULATORY_CARE_PROVIDER_SITE_OTHER): Payer: Self-pay | Admitting: Cardiothoracic Surgery

## 2012-11-12 DIAGNOSIS — I2510 Atherosclerotic heart disease of native coronary artery without angina pectoris: Secondary | ICD-10-CM

## 2012-11-13 ENCOUNTER — Ambulatory Visit (INDEPENDENT_AMBULATORY_CARE_PROVIDER_SITE_OTHER): Payer: BC Managed Care – PPO | Admitting: *Deleted

## 2012-11-13 DIAGNOSIS — Z951 Presence of aortocoronary bypass graft: Secondary | ICD-10-CM

## 2012-11-13 DIAGNOSIS — I2510 Atherosclerotic heart disease of native coronary artery without angina pectoris: Secondary | ICD-10-CM

## 2012-11-13 DIAGNOSIS — Z4800 Encounter for change or removal of nonsurgical wound dressing: Secondary | ICD-10-CM

## 2012-11-13 NOTE — Progress Notes (Signed)
She returns for wound care to her mid sternal incision.  The open wound has made significant progress in the last two days and only requires a portion of a 2x2 for packing.  The area was cleansed with normal saline and packed with a dampened normal saline portion of a 2x2, then covered with a dry dressing.

## 2012-11-16 ENCOUNTER — Ambulatory Visit (INDEPENDENT_AMBULATORY_CARE_PROVIDER_SITE_OTHER): Payer: BC Managed Care – PPO | Admitting: *Deleted

## 2012-11-16 DIAGNOSIS — I2510 Atherosclerotic heart disease of native coronary artery without angina pectoris: Secondary | ICD-10-CM

## 2012-11-16 DIAGNOSIS — Z951 Presence of aortocoronary bypass graft: Secondary | ICD-10-CM

## 2012-11-16 DIAGNOSIS — Z4800 Encounter for change or removal of nonsurgical wound dressing: Secondary | ICD-10-CM

## 2012-11-17 ENCOUNTER — Ambulatory Visit (INDEPENDENT_AMBULATORY_CARE_PROVIDER_SITE_OTHER): Payer: BC Managed Care – PPO | Admitting: *Deleted

## 2012-11-17 DIAGNOSIS — I2510 Atherosclerotic heart disease of native coronary artery without angina pectoris: Secondary | ICD-10-CM

## 2012-11-17 DIAGNOSIS — Z4800 Encounter for change or removal of nonsurgical wound dressing: Secondary | ICD-10-CM

## 2012-11-17 DIAGNOSIS — Z951 Presence of aortocoronary bypass graft: Secondary | ICD-10-CM

## 2012-11-18 ENCOUNTER — Ambulatory Visit (INDEPENDENT_AMBULATORY_CARE_PROVIDER_SITE_OTHER): Payer: BC Managed Care – PPO | Admitting: *Deleted

## 2012-11-18 DIAGNOSIS — Z4800 Encounter for change or removal of nonsurgical wound dressing: Secondary | ICD-10-CM

## 2012-11-18 DIAGNOSIS — Z951 Presence of aortocoronary bypass graft: Secondary | ICD-10-CM

## 2012-11-18 DIAGNOSIS — I2510 Atherosclerotic heart disease of native coronary artery without angina pectoris: Secondary | ICD-10-CM

## 2012-11-19 ENCOUNTER — Telehealth: Payer: Self-pay | Admitting: Cardiovascular Disease

## 2012-11-19 ENCOUNTER — Encounter (INDEPENDENT_AMBULATORY_CARE_PROVIDER_SITE_OTHER): Payer: BC Managed Care – PPO

## 2012-11-19 ENCOUNTER — Encounter (HOSPITAL_COMMUNITY)
Admission: RE | Admit: 2012-11-19 | Discharge: 2012-11-19 | Disposition: A | Discharge status: Home / Self Care | Payer: BC Managed Care – PPO | Source: Ambulatory Visit | Attending: Cardiovascular Disease | Admitting: Cardiovascular Disease

## 2012-11-19 DIAGNOSIS — I2510 Atherosclerotic heart disease of native coronary artery without angina pectoris: Secondary | ICD-10-CM

## 2012-11-19 MED ORDER — RIVAROXABAN 20 MG PO TABS: 20.0000 mg | ORAL_TABLET | Freq: Every day | ORAL | Status: DC

## 2012-11-19 NOTE — Progress Notes (Signed)
She is here for her daily wound care to her sternal wound.  The area looks good with a healthy wound base and no signs of infection. The area was cleansed with normal saline and packed with a dry portion of a 2x2, followed by a dry dressing. Will see her tomorrow for same.

## 2012-11-19 NOTE — Telephone Encounter (Signed)
Returned call and pt informed samples left at front desk.  Pt verbalized understanding and agreed w/ plan.    

## 2012-11-19 NOTE — Progress Notes (Signed)
Returns for wound care of her midsternal incisional open wound.  The area is continuing to get smaller in depth and width.  It does seem somewhat wet between changes.  Will use dry 2x2 for packing.  Will return daily for same.

## 2012-11-19 NOTE — Progress Notes (Signed)
Returns for sternal wound care. Significant improvement using dry packing into already moist wound bed. The wound in decreasing in size more rapidly than last week. Will retutn tomorrow for same.  She has started cardiac rehab.

## 2012-11-19 NOTE — Telephone Encounter (Signed)
Would like some samples of Xarelto 20 mg please.. °

## 2012-11-19 NOTE — Progress Notes (Signed)
Cardiac Rehab Medication Review by a Pharmacist  Does the patient  feel that his/her medications are working for him/her?  yes  Has the patient been experiencing any side effects to the medications prescribed?  no  Does the patient measure his/her own blood pressure or blood glucose at home?  yes   Does the patient have any problems obtaining medications due to transportation or finances?   no  Understanding of regimen: excellent Understanding of indications: excellent Potential of compliance: excellent    Pharmacist comments: Patient mentions difficulty paying for xarelto, will be talking to her doctor for samples. Patient states just got back to work, waiting on first paycheck. But no issues with compliance, and has enough xarelto right now.    [name] 11/19/2012 8:17 AM

## 2012-11-20 ENCOUNTER — Encounter: Payer: Self-pay | Admitting: Cardiothoracic Surgery

## 2012-11-20 ENCOUNTER — Ambulatory Visit
Admission: RE | Admit: 2012-11-20 | Discharge: 2012-11-20 | Disposition: A | Discharge status: Home / Self Care | Payer: BC Managed Care – PPO | Source: Ambulatory Visit | Attending: Cardiothoracic Surgery | Admitting: Cardiothoracic Surgery

## 2012-11-20 ENCOUNTER — Ambulatory Visit (INDEPENDENT_AMBULATORY_CARE_PROVIDER_SITE_OTHER): Payer: BC Managed Care – PPO | Admitting: Cardiothoracic Surgery

## 2012-11-20 DIAGNOSIS — I255 Ischemic cardiomyopathy: Secondary | ICD-10-CM

## 2012-11-20 DIAGNOSIS — I2589 Other forms of chronic ischemic heart disease: Secondary | ICD-10-CM

## 2012-11-20 DIAGNOSIS — Z951 Presence of aortocoronary bypass graft: Secondary | ICD-10-CM

## 2012-11-20 NOTE — Progress Notes (Signed)
[address]       [place] [address]             [phone]                    Janei Scheff [facility] Medical Record #[id number] Date of Birth: 09/29/1950  Dr Royann Shivers  Evette Georges, MD  Chief Complaint:   PostOp Follow Up Visit 09/28/2012  PREOPERATIVE DIAGNOSES: Coronary occlusive disease and paroxysmal  atrial fibrillation.  POSTOPERATIVE DIAGNOSES: Coronary occlusive disease and paroxysmal  atrial fibrillation.  SURGICAL PROCEDURE:  1. Coronary artery bypass grafting x4 with the left internal mammary  to the left anterior descending coronary artery sequential reverse  saphenous vein graft to the first and second obtuse marginal,  reverse saphenous vein graft to the intermediate coronary artery.  2. Left-sided maze procedure, placement of 35-mm left atrial clip and  placement of permanent left ventricular pacing lead.  3. Left leg endo vein harvesting.  SURGEON: Sheliah Plane, MD   History of Present Illness:     Patient returns to the office today for followup visit after  cardiac surgery. She's been coming to the office on a daily basis for wound dressing changes for a superficial sternal wound cellulitis. She is completed her course of antibiotics the area of the sternum this granulating in and is much improved. She noted some shortness of breath with exertion, but is back working full-time and remains active.  History  Smoking status  . Former Smoker -- 1.00 packs/day for 10 years  . Types: Cigarettes  . Quit date: 03/11/2012  Smokeless tobacco  . Never Used    Comment: Quit in January       No Known Allergies  Current Outpatient Prescriptions  Medication Sig Dispense Refill  . amiodarone (PACERONE) 200 MG tablet Take 1 tablet (200 mg total) by mouth 2 (two) times daily.  60 tablet  3  . aspirin 81 MG tablet Take 1 tablet (81 mg total) by mouth daily.  30 tablet    . atorvastatin (LIPITOR) 80 MG tablet Take 1 tablet (80  mg total) by mouth daily.  90 tablet  3  . Blood Glucose Monitoring Suppl (ONE TOUCH ULTRA SYSTEM KIT) W/DEVICE KIT 1 kit by Does not apply route once.        . carvedilol (COREG) 12.5 MG tablet Take 1 tablet (12.5 mg total) by mouth 2 (two) times daily with a meal.  60 tablet  1  . glimepiride (AMARYL) 4 MG tablet Take 4 mg by mouth 2 (two) times daily.      Marland Kitchen glucose blood test strip 1 each by Other route daily. One touch ultra 2 - dx 250.00  100 each  12  . HYDROcodone-acetaminophen (NORCO/VICODIN) 5-325 MG per tablet Take 1 tablet by mouth every 6 (six) hours as needed for pain.  40 tablet  0  . losartan (COZAAR) 25 MG tablet Take 25 mg by mouth 2 (two) times daily.       . metFORMIN (GLUCOPHAGE) 1000 MG tablet TAKE ONE TABLET BY MOUTH TWICE DAILY WITH MEALS - NEED TO SCHEDULE OFFICE VISIT  180 tablet  1  . nitroGLYCERIN (NITROSTAT) 0.4 MG SL tablet Place 1 tablet (0.4 mg total) under the tongue every 5 (five) minutes as needed for chest pain.  25 tablet  3  . Rivaroxaban (XARELTO) 20 MG TABS tablet Take 1 tablet (20 mg total) by mouth daily with supper.  30 tablet  0   No current facility-administered medications for this visit.       Physical Exam:  General appearance: alert and cooperative Neurologic: intact Heart: regular rate and rhythm, S1, S2 normal, no murmur, click, rub or gallop Lungs: diminished breath sounds LLL Abdomen: soft, non-tender; bowel sounds normal; no masses,  no organomegaly Extremities: extremities normal, atraumatic, no cyanosis or edema and Homans sign is negative, no sign of DVT Wound:  The wound is now almost completely healed with no evidence of infection there was less than a dime size area is granulating in with good granulation tissue.  Diagnostic Studies & Laboratory data:         Recent Radiology Findings: Dg Chest 2 View  11/20/2012   *RADIOLOGY REPORT*  Clinical Data: CABG 10/01/2012.  Ischemic cardiomyopathy  CHEST - 2 VIEW  Comparison:  10/30/2012  Findings: Small left effusion is unchanged.  Left lower lobe atelectasis unchanged.  Negative for heart failure or edema.  Right lung is clear.  Prior CABG noted.  IMPRESSION: Left effusion and left lower lobe atelectasis are unchanged.  No new findings.   Original Report Authenticated By: Janeece Riggers, M.D.      Recent Labs: Lab Results  Component Value Date   WBC 11.4* 10/02/2012   HGB 11.3* 10/02/2012   HCT 33.9* 10/02/2012   PLT 188 10/02/2012   GLUCOSE 177* 10/02/2012   CHOL 144 07/18/2012   TRIG 530* 07/18/2012   HDL 39* 07/18/2012   LDLDIRECT 134.3 09/19/2009   LDLCALC UNABLE TO CALCULATE IF TRIGLYCERIDE OVER 400 mg/dL 1/61/0960   ALT 22 4/54/0981   AST 14 09/25/2012   NA 135 10/02/2012   K 4.1 10/02/2012   CL 98 10/02/2012   CREATININE 0.55 10/02/2012   BUN 11 10/02/2012   CO2 31 10/02/2012   TSH 2.447 07/18/2012   INR 1.55* 09/28/2012   HGBA1C 9.1* 09/25/2012   Rhythm strip in the office confirms sinus rhythm  Assessment / Plan:   Superficial sternal wound infection now almost completely healed Chest x-ray continues to show a small left effusion        Hero Mccathern B 11/20/2012 3:16 PM

## 2012-11-23 ENCOUNTER — Encounter (HOSPITAL_COMMUNITY): Payer: BC Managed Care – PPO

## 2012-11-23 ENCOUNTER — Encounter (INDEPENDENT_AMBULATORY_CARE_PROVIDER_SITE_OTHER): Payer: BC Managed Care – PPO

## 2012-11-23 DIAGNOSIS — I2510 Atherosclerotic heart disease of native coronary artery without angina pectoris: Secondary | ICD-10-CM

## 2012-11-24 ENCOUNTER — Ambulatory Visit (INDEPENDENT_AMBULATORY_CARE_PROVIDER_SITE_OTHER): Payer: Self-pay

## 2012-11-24 ENCOUNTER — Other Ambulatory Visit: Payer: Self-pay | Admitting: Cardiothoracic Surgery

## 2012-11-24 DIAGNOSIS — I2510 Atherosclerotic heart disease of native coronary artery without angina pectoris: Secondary | ICD-10-CM

## 2012-11-25 ENCOUNTER — Ambulatory Visit: Payer: BC Managed Care – PPO | Admitting: Cardiovascular Disease

## 2012-11-25 ENCOUNTER — Ambulatory Visit (INDEPENDENT_AMBULATORY_CARE_PROVIDER_SITE_OTHER): Payer: Self-pay | Admitting: *Deleted

## 2012-11-25 ENCOUNTER — Ambulatory Visit (INDEPENDENT_AMBULATORY_CARE_PROVIDER_SITE_OTHER): Payer: BC Managed Care – PPO | Admitting: Cardiovascular Disease

## 2012-11-25 ENCOUNTER — Other Ambulatory Visit: Payer: Self-pay | Admitting: *Deleted

## 2012-11-25 ENCOUNTER — Encounter: Payer: Self-pay | Admitting: Cardiovascular Disease

## 2012-11-25 ENCOUNTER — Telehealth: Payer: Self-pay | Admitting: Cardiovascular Disease

## 2012-11-25 ENCOUNTER — Encounter (HOSPITAL_COMMUNITY): Payer: BC Managed Care – PPO

## 2012-11-25 ENCOUNTER — Encounter: Payer: BC Managed Care – PPO | Admitting: Family Medicine

## 2012-11-25 ENCOUNTER — Other Ambulatory Visit: Payer: Self-pay | Admitting: Cardiothoracic Surgery

## 2012-11-25 VITALS — BP 100/62 | HR 71 | Ht 64.5 in | Wt 154.7 lb

## 2012-11-25 DIAGNOSIS — R079 Chest pain, unspecified: Secondary | ICD-10-CM

## 2012-11-25 DIAGNOSIS — I2510 Atherosclerotic heart disease of native coronary artery without angina pectoris: Secondary | ICD-10-CM

## 2012-11-25 DIAGNOSIS — I10 Essential (primary) hypertension: Secondary | ICD-10-CM

## 2012-11-25 DIAGNOSIS — Z951 Presence of aortocoronary bypass graft: Secondary | ICD-10-CM

## 2012-11-25 DIAGNOSIS — I5043 Acute on chronic combined systolic (congestive) and diastolic (congestive) heart failure: Secondary | ICD-10-CM

## 2012-11-25 DIAGNOSIS — L0390 Cellulitis, unspecified: Secondary | ICD-10-CM

## 2012-11-25 DIAGNOSIS — T8189XD Other complications of procedures, not elsewhere classified, subsequent encounter: Secondary | ICD-10-CM

## 2012-11-25 DIAGNOSIS — I480 Paroxysmal atrial fibrillation: Secondary | ICD-10-CM

## 2012-11-25 DIAGNOSIS — I255 Ischemic cardiomyopathy: Secondary | ICD-10-CM

## 2012-11-25 DIAGNOSIS — I509 Heart failure, unspecified: Secondary | ICD-10-CM

## 2012-11-25 DIAGNOSIS — I2589 Other forms of chronic ischemic heart disease: Secondary | ICD-10-CM

## 2012-11-25 DIAGNOSIS — I4891 Unspecified atrial fibrillation: Secondary | ICD-10-CM

## 2012-11-25 LAB — BUN: BUN: 12 mg/dL (ref 6–23)

## 2012-11-25 LAB — CREATININE, SERUM: Creat: 0.72 mg/dL (ref 0.50–1.10)

## 2012-11-25 MED ORDER — AMIODARONE HCL 200 MG PO TABS: 200.0000 mg | ORAL_TABLET | Freq: Every day | ORAL | Status: DC

## 2012-11-25 MED ORDER — CARVEDILOL 12.5 MG PO TABS: 6.2500 mg | ORAL_TABLET | Freq: Two times a day (BID) | ORAL | Status: DC

## 2012-11-25 NOTE — Progress Notes (Signed)
[name] returns for her daily dressing change of her sternal wound dressing.  The drainage is dark due to being treated with silver nitrate sticks by Dr. Donata Clay at her visit yesterday.  She has had 3 doses of the Keflex.  There is still some superficial redness to her left breast and the skin is somewhat hard to the touch in this area....the same appearance when she initially appeared several weeks ago.  I discussed getting a CT CHEST with contrast that Dr.Van Trigt had proposed at yesterday's visit. He felt this should be done and it will be arranged before her visit tomorrow so Dr. Tyrone Sage can review and see her also.

## 2012-11-25 NOTE — Telephone Encounter (Signed)
Pharmacy has question about Amiodarone that was just sent.. Has questions about dosage.

## 2012-11-25 NOTE — Telephone Encounter (Signed)
Returned call to [place].  Wanted to confirm dose of amioderone decreased to 200 mg once daily.  Reviewed chart and informed pt in office today and it was decreased to 200 mg daily.  Verbalized understanding.

## 2012-11-25 NOTE — Patient Instructions (Addendum)
Your physician has recommended you make the following change in your medication:  Reduce amiodarone to 200 mg once daily only Reduce carvedilol to 6.25 mg (half of the 12.5 mg tablets) twice daily Your physician recommends that you schedule a follow-up appointment in: 3 months. Your physician has requested that you have an echocardiogram just before that followup appointment. Echocardiography is a painless test that uses sound waves to create images of your heart. It provides your doctor with information about the size and shape of your heart and how well your heart's chambers and valves are working. This procedure takes approximately one hour. There are no restrictions for this procedure. Your physician discussed the importance of regular exercise and recommended that you start or continue a regular exercise program for good health. Your goal should be to walk 2 miles daily by the end of October.

## 2012-11-26 ENCOUNTER — Encounter: Payer: Self-pay | Admitting: Cardiothoracic Surgery

## 2012-11-26 ENCOUNTER — Encounter: Payer: BC Managed Care – PPO | Admitting: Internal Medicine

## 2012-11-26 ENCOUNTER — Ambulatory Visit (INDEPENDENT_AMBULATORY_CARE_PROVIDER_SITE_OTHER): Payer: BC Managed Care – PPO | Admitting: Cardiothoracic Surgery

## 2012-11-26 ENCOUNTER — Ambulatory Visit
Admission: RE | Admit: 2012-11-26 | Discharge: 2012-11-26 | Disposition: A | Discharge status: Home / Self Care | Payer: BC Managed Care – PPO | Source: Ambulatory Visit | Attending: Cardiothoracic Surgery | Admitting: Cardiothoracic Surgery

## 2012-11-26 VITALS — BP 146/93 | HR 97 | Temp 97.1°F | Resp 16 | Ht 64.0 in | Wt 155.0 lb

## 2012-11-26 DIAGNOSIS — Z8679 Personal history of other diseases of the circulatory system: Secondary | ICD-10-CM

## 2012-11-26 DIAGNOSIS — L0390 Cellulitis, unspecified: Secondary | ICD-10-CM

## 2012-11-26 DIAGNOSIS — T8189XD Other complications of procedures, not elsewhere classified, subsequent encounter: Secondary | ICD-10-CM

## 2012-11-26 DIAGNOSIS — L0291 Cutaneous abscess, unspecified: Secondary | ICD-10-CM

## 2012-11-26 DIAGNOSIS — Z951 Presence of aortocoronary bypass graft: Secondary | ICD-10-CM

## 2012-11-26 DIAGNOSIS — Z5189 Encounter for other specified aftercare: Secondary | ICD-10-CM

## 2012-11-26 DIAGNOSIS — Z9889 Other specified postprocedural states: Secondary | ICD-10-CM

## 2012-11-26 MED ORDER — IOHEXOL 300 MG/ML  SOLN
75.0000 mL | Freq: Once | INTRAMUSCULAR | Status: AC | PRN
  Administered 2012-11-26: 75 mL via INTRAVENOUS

## 2012-11-26 MED ORDER — FUROSEMIDE 20 MG PO TABS: 20.0000 mg | ORAL_TABLET | Freq: Every day | ORAL | Status: DC

## 2012-11-26 NOTE — Progress Notes (Signed)
[address]       [place] [address]             [phone]                    [name] Medical Record #[id number] Date of Birth: 1950-11-23  Dr Royann Shivers  Evette Georges, MD  Chief Complaint:   PostOp Follow Up Visit 09/28/2012  PREOPERATIVE DIAGNOSES: Coronary occlusive disease and paroxysmal  atrial fibrillation.  POSTOPERATIVE DIAGNOSES: Coronary occlusive disease and paroxysmal  atrial fibrillation.  SURGICAL PROCEDURE:  1. Coronary artery bypass grafting x4 with the left internal mammary  to the left anterior descending coronary artery sequential reverse  saphenous vein graft to the first and second obtuse marginal,  reverse saphenous vein graft to the intermediate coronary artery.  2. Left-sided maze procedure, placement of 35-mm left atrial clip and  placement of permanent left ventricular pacing lead.  3. Left leg endo vein harvesting.  SURGEON: Sheliah Plane, MD   History of Present Illness:     Patient returns to the office today for followup visit after  cardiac surgery. She's been coming to the office on a daily basis for wound dressing changes for a superficial sternal wound cellulitis. She is completed her course of antibiotics the area of the sternum had been improving. Yesterday she saw Dr. Donata Clay, because of increasing erythema toward the left lower incision. She was restarted on Keflex and a CT scan of the chest was ordered. The patient returns to the office today to examine the wound and reviewed the CT findings.  History  Smoking status  . Former Smoker -- 1.00 packs/day for 10 years  . Types: Cigarettes  . Quit date: 03/11/2012  Smokeless tobacco  . Never Used    Comment: Quit in January       No Known Allergies  Current Outpatient Prescriptions  Medication Sig Dispense Refill  . amiodarone (PACERONE) 200 MG tablet Take 1 tablet (200 mg total) by mouth daily.  60 tablet  3  . aspirin 81 MG  tablet Take 1 tablet (81 mg total) by mouth daily.  30 tablet    . atorvastatin (LIPITOR) 80 MG tablet Take 1 tablet (80 mg total) by mouth daily.  90 tablet  3  . Blood Glucose Monitoring Suppl (ONE TOUCH ULTRA SYSTEM KIT) W/DEVICE KIT 1 kit by Does not apply route once.        . carvedilol (COREG) 12.5 MG tablet Take 0.5 tablets (6.25 mg total) by mouth 2 (two) times daily with a meal.  60 tablet  1  . cephALEXin (KEFLEX) 500 MG capsule Take 500 mg by mouth 3 (three) times daily.       Marland Kitchen glimepiride (AMARYL) 4 MG tablet Take 4 mg by mouth 2 (two) times daily.      Marland Kitchen glucose blood test strip 1 each by Other route daily. One touch ultra 2 - dx 250.00  100 each  12  . HYDROcodone-acetaminophen (NORCO/VICODIN) 5-325 MG per tablet Take 1 tablet by mouth every 6 (six) hours as needed for pain.  40 tablet  0  . losartan (COZAAR) 25 MG tablet Take 25 mg by mouth 2 (two) times daily.       . metFORMIN (GLUCOPHAGE) 1000 MG tablet TAKE ONE TABLET BY MOUTH TWICE DAILY WITH MEALS - NEED TO SCHEDULE OFFICE VISIT  180 tablet  1  . nitroGLYCERIN (NITROSTAT) 0.4 MG SL tablet  Place 1 tablet (0.4 mg total) under the tongue every 5 (five) minutes as needed for chest pain.  25 tablet  3  . Rivaroxaban (XARELTO) 20 MG TABS tablet Take 1 tablet (20 mg total) by mouth daily with supper.  30 tablet  0  . furosemide (LASIX) 20 MG tablet Take 1 tablet (20 mg total) by mouth daily.  30 tablet  1   No current facility-administered medications for this visit.       Physical Exam:  General appearance: alert and cooperative Neurologic: intact Heart: regular rate and rhythm, S1, S2 normal, no murmur, click, rub or gallop Lungs: diminished breath sounds LLL Abdomen: soft, non-tender; bowel sounds normal; no masses,  no organomegaly Extremities: extremities normal, atraumatic, no cyanosis or edema and Homans sign is negative, no sign of DVT Wound:  The wound is granulating there is some erythema toward the left but not  definitely involving the left breast implant. It is somewhat improved from yesterday. The wound does not track deeply.  Diagnostic Studies & Laboratory data:         Recent Radiology Findings: Ct Chest W Contrast  11/26/2012   CLINICAL DATA:  Sternal drainage post CABG in July of 2014, also history of left breast carcinoma and urine carcinoma, former smoking history.  EXAM: CT CHEST WITH CONTRAST  TECHNIQUE: Multidetector CT imaging of the chest was performed during intravenous contrast administration.  CONTRAST:  75mL OMNIPAQUE IOHEXOL 300 MG/ML  SOLN  COMPARISON:  Chest x-ray of 11/20/2012  FINDINGS: On soft tissue window images, there is mild linear stranding this within the anterior retrosternal fat planes postoperatively. However no mediastinal abscess or hematoma is seen. There is also some strandiness within the superficial soft tissues at the operative site consistent with postoperative change or cellulitis. No abscess is noted. No mediastinal or hilar adenopathy is seen. The thoracic aorta and pulmonary arteries opacify with no significant abnormality noted. There is a moderate-sized left pleural effusion present with partial left lower lobe atelectasis. What is seen of the upper abdomen is unremarkable.  On lung window images, there is airspace disease within the right upper lobe and to a lesser degree in the superior segment of the right lower lobe. This may represent pneumonia, possibly aspiration, with asymmetrical edema less likely. No right pleural effusion is seen. On the lateral view no bony abnormality is seen. Sternal sutures appear normally positioned with no abnormality evident.  IMPRESSION: 1. No mediastinal abscess or hematoma is seen. 2. Strandiness in the superficial soft tissues over the median sternotomy site may represent postoperative change or cellulitis. No abscess is noted. 3. Moderate-sized left pleural effusion with partial left lower lobe atelectasis. 4. Airspace disease in  the right upper lobe and superior segment of the right lower lobe. Consider pneumonia as with aspiration or less likely asymmetric or edema.   Electronically Signed   By: Dwyane Dee M.D.   On: 11/26/2012 11:07      Recent Labs: Lab Results  Component Value Date   WBC 11.4* 10/02/2012   HGB 11.3* 10/02/2012   HCT 33.9* 10/02/2012   PLT 188 10/02/2012   GLUCOSE 177* 10/02/2012   CHOL 144 07/18/2012   TRIG 530* 07/18/2012   HDL 39* 07/18/2012   LDLDIRECT 134.3 09/19/2009   LDLCALC UNABLE TO CALCULATE IF TRIGLYCERIDE OVER 400 mg/dL 06/17/8117   ALT 22 1/47/8295   AST 14 09/25/2012   NA 135 10/02/2012   K 4.1 10/02/2012   CL 98 10/02/2012  CREATININE 0.72 11/25/2012   BUN 12 11/25/2012   CO2 31 10/02/2012   TSH 2.447 07/18/2012   INR 1.55* 09/28/2012   HGBA1C 9.1* 09/25/2012   Rhythm strip in the office confirms sinus rhythm  Assessment / Plan:   Superficial sternal wound infection  without evidence on CT scan of the sternal infection mediastinal infection or fluid collection around bilateral implants  She does have some left pleural effusion We'll continue the ten-day course of Keflex, daily dressing change in the office I started on course of by mouth Lasix 20 mg a day. We'll plan to see her back in one week.        Elye Harmsen B 11/26/2012 11:41 AM

## 2012-11-26 NOTE — Patient Instructions (Signed)
Continue Keflex Continue dressing changes as doing

## 2012-11-27 ENCOUNTER — Encounter: Payer: Self-pay | Admitting: Cardiothoracic Surgery

## 2012-11-27 ENCOUNTER — Encounter (HOSPITAL_COMMUNITY): Payer: BC Managed Care – PPO

## 2012-11-27 ENCOUNTER — Ambulatory Visit (INDEPENDENT_AMBULATORY_CARE_PROVIDER_SITE_OTHER): Payer: BC Managed Care – PPO | Admitting: Cardiothoracic Surgery

## 2012-11-27 DIAGNOSIS — Z951 Presence of aortocoronary bypass graft: Secondary | ICD-10-CM

## 2012-11-27 LAB — WOUND CULTURE
Gram Stain: NONE SEEN
Gram Stain: NONE SEEN

## 2012-11-27 MED ORDER — CIPROFLOXACIN HCL 500 MG PO TABS: 500.0000 mg | ORAL_TABLET | Freq: Two times a day (BID) | ORAL | Status: DC

## 2012-11-27 NOTE — Progress Notes (Signed)
[address]       [place] [address]             [phone]                Jacelyn Cuen [facility] Medical Record #[id number] Date of Birth: 11-22-1950  Dr Royann Shivers  Evette Georges, MD  Chief Complaint:   PostOp Follow Up Visit 09/28/2012  PREOPERATIVE DIAGNOSES: Coronary occlusive disease and paroxysmal  atrial fibrillation.  POSTOPERATIVE DIAGNOSES: Coronary occlusive disease and paroxysmal  atrial fibrillation.  SURGICAL PROCEDURE:  1. Coronary artery bypass grafting x4 with the left internal mammary  to the left anterior descending coronary artery sequential reverse  saphenous vein graft to the first and second obtuse marginal,  reverse saphenous vein graft to the intermediate coronary artery.  2. Left-sided maze procedure, placement of 35-mm left atrial clip and  placement of permanent left ventricular pacing lead.  3. Left leg endo vein harvesting.  SURGEON: Sheliah Plane, MD   History of Present Illness:     Patient returns to the office today for followup visit after  cardiac surgery. She's been coming to the office on a daily basis for wound dressing changes for a superficial sternal wound cellulitis. She is completed her course of antibiotics the area of the sternum had been improving. Yesterday she saw Dr. Donata Clay, because of increasing erythema toward the left lower incision. She was restarted on Keflex and a CT scan of the chest was ordered. The patient returns to the office yesterday to examine the wound and reviewed the CT findings.  Today she returns with improvement at chest incision, but erythremia without drainage at the left knee endo vein harvest site. She has had no fever or chills  History  Smoking status  . Former Smoker -- 1.00 packs/day for 10 years  . Types: Cigarettes  . Quit date: 03/11/2012  Smokeless tobacco  . Never Used    Comment: Quit in January       No Known Allergies  Current  Outpatient Prescriptions  Medication Sig Dispense Refill  . amiodarone (PACERONE) 200 MG tablet Take 1 tablet (200 mg total) by mouth daily.  60 tablet  3  . aspirin 81 MG tablet Take 1 tablet (81 mg total) by mouth daily.  30 tablet    . atorvastatin (LIPITOR) 80 MG tablet Take 1 tablet (80 mg total) by mouth daily.  90 tablet  3  . Blood Glucose Monitoring Suppl (ONE TOUCH ULTRA SYSTEM KIT) W/DEVICE KIT 1 kit by Does not apply route once.        . carvedilol (COREG) 12.5 MG tablet Take 0.5 tablets (6.25 mg total) by mouth 2 (two) times daily with a meal.  60 tablet  1  . ciprofloxacin (CIPRO) 500 MG tablet Take 1 tablet (500 mg total) by mouth 2 (two) times daily.  25 tablet  1  . furosemide (LASIX) 20 MG tablet Take 1 tablet (20 mg total) by mouth daily.  30 tablet  1  . glimepiride (AMARYL) 4 MG tablet Take 4 mg by mouth 2 (two) times daily.      Marland Kitchen glucose blood test strip 1 each by Other route daily. One touch ultra 2 - dx 250.00  100 each  12  . HYDROcodone-acetaminophen (NORCO/VICODIN) 5-325 MG per tablet Take 1 tablet by mouth every 6 (six) hours as needed for pain.  40 tablet  0  . losartan (  COZAAR) 25 MG tablet Take 25 mg by mouth 2 (two) times daily.       . metFORMIN (GLUCOPHAGE) 1000 MG tablet TAKE ONE TABLET BY MOUTH TWICE DAILY WITH MEALS - NEED TO SCHEDULE OFFICE VISIT  180 tablet  1  . nitroGLYCERIN (NITROSTAT) 0.4 MG SL tablet Place 1 tablet (0.4 mg total) under the tongue every 5 (five) minutes as needed for chest pain.  25 tablet  3  . Rivaroxaban (XARELTO) 20 MG TABS tablet Take 1 tablet (20 mg total) by mouth daily with supper.  30 tablet  0   No current facility-administered medications for this visit.       Physical Exam:  General appearance: alert and cooperative Neurologic: intact Heart: regular rate and rhythm, S1, S2 normal, no murmur, click, rub or gallop Lungs: diminished breath sounds LLL Abdomen: soft, non-tender; bowel sounds normal; no masses,  no  organomegaly Extremities: extremities normal, atraumatic, no cyanosis or edema and Homans sign is negative, no sign of DVT Wound:  The wound is granulating there is some erythema toward the left but not definitely involving the left breast implant. It is somewhat improved from yesterday. The wound does not track deeply. erythmia now at left knee incision site without drainage   Diagnostic Studies & Laboratory data:         Recent Radiology Findings: Ct Chest W Contrast  11/26/2012   CLINICAL DATA:  Sternal drainage post CABG in July of 2014, also history of left breast carcinoma and urine carcinoma, former smoking history.  EXAM: CT CHEST WITH CONTRAST  TECHNIQUE: Multidetector CT imaging of the chest was performed during intravenous contrast administration.  CONTRAST:  75mL OMNIPAQUE IOHEXOL 300 MG/ML  SOLN  COMPARISON:  Chest x-ray of 11/20/2012  FINDINGS: On soft tissue window images, there is mild linear stranding this within the anterior retrosternal fat planes postoperatively. However no mediastinal abscess or hematoma is seen. There is also some strandiness within the superficial soft tissues at the operative site consistent with postoperative change or cellulitis. No abscess is noted. No mediastinal or hilar adenopathy is seen. The thoracic aorta and pulmonary arteries opacify with no significant abnormality noted. There is a moderate-sized left pleural effusion present with partial left lower lobe atelectasis. What is seen of the upper abdomen is unremarkable.  On lung window images, there is airspace disease within the right upper lobe and to a lesser degree in the superior segment of the right lower lobe. This may represent pneumonia, possibly aspiration, with asymmetrical edema less likely. No right pleural effusion is seen. On the lateral view no bony abnormality is seen. Sternal sutures appear normally positioned with no abnormality evident.  IMPRESSION: 1. No mediastinal abscess or hematoma  is seen. 2. Strandiness in the superficial soft tissues over the median sternotomy site may represent postoperative change or cellulitis. No abscess is noted. 3. Moderate-sized left pleural effusion with partial left lower lobe atelectasis. 4. Airspace disease in the right upper lobe and superior segment of the right lower lobe. Consider pneumonia as with aspiration or less likely asymmetric or edema.   Electronically Signed   By: Dwyane Dee M.D.   On: 11/26/2012 11:07      Recent Labs: Lab Results  Component Value Date   WBC 11.4* 10/02/2012   HGB 11.3* 10/02/2012   HCT 33.9* 10/02/2012   PLT 188 10/02/2012   GLUCOSE 177* 10/02/2012   CHOL 144 07/18/2012   TRIG 530* 07/18/2012   HDL 39* 07/18/2012  LDLDIRECT 134.3 09/19/2009   LDLCALC UNABLE TO CALCULATE IF TRIGLYCERIDE OVER 400 mg/dL 1/61/0960   ALT 22 4/54/0981   AST 14 09/25/2012   NA 135 10/02/2012   K 4.1 10/02/2012   CL 98 10/02/2012   CREATININE 0.72 11/25/2012   BUN 12 11/25/2012   CO2 31 10/02/2012   TSH 2.447 07/18/2012   INR 1.55* 09/28/2012   HGBA1C 9.1* 09/25/2012   Rhythm strip in the office confirms sinus rhythm  Assessment / Plan:   Superficial sternal wound infection  without evidence on CT scan of the sternal infection mediastinal infection or fluid collection around bilateral implants  She does have some left pleural effusion We will change antibiotics to Cipro( which previous culture of sternum was sensitive) the ten-day course , daily dressing change in the office We'll plan to see her back in one week.        Nocholas Damaso B 11/27/2012 3:43 PM

## 2012-11-27 NOTE — Progress Notes (Unsigned)
[name] returns for wound care of her sternal incision.  The area was cleansed with normal saline then packed with a NS  dampened piece of gauze and covered with a dry dressing.  She will return tomorrow for another dressing change.

## 2012-11-30 ENCOUNTER — Encounter (INDEPENDENT_AMBULATORY_CARE_PROVIDER_SITE_OTHER): Payer: BC Managed Care – PPO

## 2012-11-30 ENCOUNTER — Encounter (HOSPITAL_COMMUNITY): Payer: BC Managed Care – PPO

## 2012-11-30 DIAGNOSIS — I2510 Atherosclerotic heart disease of native coronary artery without angina pectoris: Secondary | ICD-10-CM

## 2012-12-01 ENCOUNTER — Telehealth: Payer: Self-pay | Admitting: Family Medicine

## 2012-12-01 ENCOUNTER — Encounter (INDEPENDENT_AMBULATORY_CARE_PROVIDER_SITE_OTHER): Payer: BC Managed Care – PPO

## 2012-12-01 DIAGNOSIS — I2510 Atherosclerotic heart disease of native coronary artery without angina pectoris: Secondary | ICD-10-CM

## 2012-12-01 NOTE — Telephone Encounter (Signed)
Julieta Gutting called for verbal approval on testing strips, easy touch prep pads for pt. pls call.

## 2012-12-02 ENCOUNTER — Encounter (INDEPENDENT_AMBULATORY_CARE_PROVIDER_SITE_OTHER): Payer: BC Managed Care – PPO

## 2012-12-02 ENCOUNTER — Encounter (HOSPITAL_COMMUNITY): Payer: BC Managed Care – PPO

## 2012-12-02 DIAGNOSIS — I2510 Atherosclerotic heart disease of native coronary artery without angina pectoris: Secondary | ICD-10-CM

## 2012-12-02 NOTE — Telephone Encounter (Signed)
Spoke with patient and she is not receiving test strips from this company.

## 2012-12-03 ENCOUNTER — Ambulatory Visit (INDEPENDENT_AMBULATORY_CARE_PROVIDER_SITE_OTHER): Payer: BC Managed Care – PPO | Admitting: Cardiothoracic Surgery

## 2012-12-03 ENCOUNTER — Encounter: Payer: Self-pay | Admitting: Cardiothoracic Surgery

## 2012-12-03 VITALS — BP 109/69 | HR 79 | Resp 20 | Ht 64.0 in | Wt 155.0 lb

## 2012-12-03 DIAGNOSIS — I2510 Atherosclerotic heart disease of native coronary artery without angina pectoris: Secondary | ICD-10-CM

## 2012-12-03 DIAGNOSIS — L0291 Cutaneous abscess, unspecified: Secondary | ICD-10-CM

## 2012-12-03 DIAGNOSIS — L0390 Cellulitis, unspecified: Secondary | ICD-10-CM

## 2012-12-03 DIAGNOSIS — Z951 Presence of aortocoronary bypass graft: Secondary | ICD-10-CM

## 2012-12-03 NOTE — Progress Notes (Signed)
[address]       [place] [address]             [phone]                [name] [facility] Medical Record #[id number] Date of Birth: 1950-06-12  Dr Royann Shivers  Evette Georges, MD  Chief Complaint:   PostOp Follow Up Visit 09/28/2012  PREOPERATIVE DIAGNOSES: Coronary occlusive disease and paroxysmal  atrial fibrillation.  POSTOPERATIVE DIAGNOSES: Coronary occlusive disease and paroxysmal  atrial fibrillation.  SURGICAL PROCEDURE:  1. Coronary artery bypass grafting x4 with the left internal mammary  to the left anterior descending coronary artery sequential reverse  saphenous vein graft to the first and second obtuse marginal,  reverse saphenous vein graft to the intermediate coronary artery.  2. Left-sided maze procedure, placement of 35-mm left atrial clip and  placement of permanent left ventricular pacing lead.  3. Left leg endo vein harvesting.  SURGEON: Sheliah Plane, MD   History of Present Illness:     Patient returns to the office today for followup visit after  cardiac surgery. She's been coming to the office on a daily basis for wound dressing changes for a superficial sternal wound cellulitis. She is completed her course of antibiotics the area of the sternum had been improving. Last week  she saw Dr. Donata Clay, because of increasing erythema toward the left lower incision. She was restarted on Keflex and a CT scan of the chest was ordered. She returned the following day with erythema at the left knee end of vein harvest site. The antibiotic coverage was changed from Keflex to Cipro, she is now been on Cipro x7 days  Today she returns with improvement at chest incision, the erythremia  at the left knee endo vein harvest site has now resolved. She has had no fever or chills  History  Smoking status  . Former Smoker -- 1.00 packs/day for 10 years  . Types: Cigarettes  . Quit date: 03/11/2012  Smokeless tobacco  .  Never Used    Comment: Quit in January       No Known Allergies  Current Outpatient Prescriptions  Medication Sig Dispense Refill  . amiodarone (PACERONE) 200 MG tablet Take 1 tablet (200 mg total) by mouth daily.  60 tablet  3  . aspirin 81 MG tablet Take 1 tablet (81 mg total) by mouth daily.  30 tablet    . atorvastatin (LIPITOR) 80 MG tablet Take 1 tablet (80 mg total) by mouth daily.  90 tablet  3  . Blood Glucose Monitoring Suppl (ONE TOUCH ULTRA SYSTEM KIT) W/DEVICE KIT 1 kit by Does not apply route once.        . carvedilol (COREG) 12.5 MG tablet Take 0.5 tablets (6.25 mg total) by mouth 2 (two) times daily with a meal.  60 tablet  1  . ciprofloxacin (CIPRO) 500 MG tablet Take 1 tablet (500 mg total) by mouth 2 (two) times daily.  25 tablet  1  . furosemide (LASIX) 20 MG tablet Take 1 tablet (20 mg total) by mouth daily.  30 tablet  1  . glimepiride (AMARYL) 4 MG tablet Take 4 mg by mouth 2 (two) times daily.      Marland Kitchen glucose blood test strip 1 each by Other route daily. One touch ultra 2 - dx 250.00  100 each  12  . HYDROcodone-acetaminophen (NORCO/VICODIN) 5-325 MG per tablet  Take 1 tablet by mouth every 6 (six) hours as needed for pain.  40 tablet  0  . losartan (COZAAR) 25 MG tablet Take 25 mg by mouth 2 (two) times daily.       . metFORMIN (GLUCOPHAGE) 1000 MG tablet TAKE ONE TABLET BY MOUTH TWICE DAILY WITH MEALS - NEED TO SCHEDULE OFFICE VISIT  180 tablet  1  . nitroGLYCERIN (NITROSTAT) 0.4 MG SL tablet Place 1 tablet (0.4 mg total) under the tongue every 5 (five) minutes as needed for chest pain.  25 tablet  3  . Rivaroxaban (XARELTO) 20 MG TABS tablet Take 1 tablet (20 mg total) by mouth daily with supper.  30 tablet  0   No current facility-administered medications for this visit.       Physical Exam:  General appearance: alert and cooperative Neurologic: intact Heart: regular rate and rhythm, S1, S2 normal, no murmur, click, rub or gallop Lungs: diminished breath  sounds LLL Abdomen: soft, non-tender; bowel sounds normal; no masses,  no organomegaly Extremities: extremities normal, atraumatic, no cyanosis or edema and Homans sign is negative, no sign of DVT Wound:  The wound is granulating there is some erythema toward the left but not definitely involving the left breast implant. It is much improved from last week. The wound does not track deeply. erythmia now resolved at left knee incision site   Diagnostic Studies & Laboratory data:         Recent Radiology Findings: No results found.    Recent Labs: Lab Results  Component Value Date   WBC 11.4* 10/02/2012   HGB 11.3* 10/02/2012   HCT 33.9* 10/02/2012   PLT 188 10/02/2012   GLUCOSE 177* 10/02/2012   CHOL 144 07/18/2012   TRIG 530* 07/18/2012   HDL 39* 07/18/2012   LDLDIRECT 134.3 09/19/2009   LDLCALC UNABLE TO CALCULATE IF TRIGLYCERIDE OVER 400 mg/dL 1/61/0960   ALT 22 4/54/0981   AST 14 09/25/2012   NA 135 10/02/2012   K 4.1 10/02/2012   CL 98 10/02/2012   CREATININE 0.72 11/25/2012   BUN 12 11/25/2012   CO2 31 10/02/2012   TSH 2.447 07/18/2012   INR 1.55* 09/28/2012   HGBA1C 9.1* 09/25/2012   Rhythm strip in the office confirms sinus rhythm  Assessment / Plan:   Superficial sternal wound infection  without evidence on CT scan of the sternal infection mediastinal infection or fluid collection around bilateral implants  She does have some left pleural effusion We will complete course of Cipro( which previous culture of sternum was sensitive) the ten-day course , daily dressing change in the office We'll plan to see her back in 2 weeks.        Donna Snooks B 12/03/2012 2:56 PM

## 2012-12-03 NOTE — Patient Instructions (Signed)
Continue antibiotics for 4 more days

## 2012-12-04 ENCOUNTER — Encounter (HOSPITAL_COMMUNITY): Payer: BC Managed Care – PPO

## 2012-12-04 ENCOUNTER — Encounter (INDEPENDENT_AMBULATORY_CARE_PROVIDER_SITE_OTHER): Payer: BC Managed Care – PPO

## 2012-12-04 DIAGNOSIS — I2510 Atherosclerotic heart disease of native coronary artery without angina pectoris: Secondary | ICD-10-CM

## 2012-12-05 NOTE — Progress Notes (Signed)
Patient ID: [name], female   DOB: Jun 02, 1950, 62 y.o.   MRN: [id number]    Reason for office visit CAD s/p CABG, periop AF  [name] is recovering rather slowly from her bypass procedure. Under portion she has developed what appears to be soft tissue infection of the lower half of her sternotomy. She is undergoing workup to make sure that this is not a deep sternal infection. She has a followup scheduled with Dr. Tyrone Sage soon. She has been having dressing changes and is taking antibiotics with reduction in the amount of erythema, she is worried about potential spread of infection to her breast implants  She is a little teary-eyed today and does not put on the brave face that she usually has. She complains of feeling weak and dizzy and becoming easily short of breath. Preoperatively she had a left ventricular ejection fraction of about 35%, but there was hope that there would be substantial improvement in ejection fraction following revascularization. She has not had  Orthopnea or PND, her BP is low. She has not had palpitations. Her ECG shows sinus rhythm and a pre-existing left bundle branch block. A epicardial left ventricular lead was placed at the time of bypass surgery in anticipation of possible need for CRT.    No Known Allergies  Current Outpatient Prescriptions  Medication Sig Dispense Refill  . amiodarone (PACERONE) 200 MG tablet Take 1 tablet (200 mg total) by mouth daily.  60 tablet  3  . aspirin 81 MG tablet Take 1 tablet (81 mg total) by mouth daily.  30 tablet    . atorvastatin (LIPITOR) 80 MG tablet Take 1 tablet (80 mg total) by mouth daily.  90 tablet  3  . Blood Glucose Monitoring Suppl (ONE TOUCH ULTRA SYSTEM KIT) W/DEVICE KIT 1 kit by Does not apply route once.        . carvedilol (COREG) 12.5 MG tablet Take 0.5 tablets (6.25 mg total) by mouth 2 (two) times daily with a meal.  60 tablet  1  . glimepiride (AMARYL) 4 MG tablet Take 4 mg by mouth 2 (two) times daily.       Marland Kitchen glucose blood test strip 1 each by Other route daily. One touch ultra 2 - dx 250.00  100 each  12  . HYDROcodone-acetaminophen (NORCO/VICODIN) 5-325 MG per tablet Take 1 tablet by mouth every 6 (six) hours as needed for pain.  40 tablet  0  . losartan (COZAAR) 25 MG tablet Take 25 mg by mouth 2 (two) times daily.       . metFORMIN (GLUCOPHAGE) 1000 MG tablet TAKE ONE TABLET BY MOUTH TWICE DAILY WITH MEALS - NEED TO SCHEDULE OFFICE VISIT  180 tablet  1  . nitroGLYCERIN (NITROSTAT) 0.4 MG SL tablet Place 1 tablet (0.4 mg total) under the tongue every 5 (five) minutes as needed for chest pain.  25 tablet  3  . Rivaroxaban (XARELTO) 20 MG TABS tablet Take 1 tablet (20 mg total) by mouth daily with supper.  30 tablet  0  . ciprofloxacin (CIPRO) 500 MG tablet Take 1 tablet (500 mg total) by mouth 2 (two) times daily.  25 tablet  1  . furosemide (LASIX) 20 MG tablet Take 1 tablet (20 mg total) by mouth daily.  30 tablet  1   No current facility-administered medications for this visit.    Past Medical History  Diagnosis Date  . Diabetes mellitus type II   . Hyperlipidemia   . Hypertension   .  Allergic rhinitis   . Cataract   . CAD (coronary artery disease)     a. 2004: s/p MI in [place]. No PCI->Medical RX;  b. 07/2012 Cath: LM 30-40, LAD 70p, 70/83m, D1 80-90p, OM1 small 90p, OM2 large 80-90p, 97m, 70-80d, RCA 20-30 diff, EF 40%, glob HK.s/p CABG  . Ischemic cardiomyopathy     a. 07/2012 Echo: EF 35%, Sev inferoseptal HK, mildly dil LA, Peak PASP .  Marland Kitchen PAF (paroxysmal atrial fibrillation)     a. 07/2012: Amio and xarelto initiated.  Marland Kitchen LBBB (left bundle branch block)     a. intermittent - present during rapid afib 07/2012.  . Breast cancer 2002    Patient reports left breast cancer diagnosis in 2002 treated with bilateral mastectomy positive lymph nodes with left axillary dissection followed by chemotherapy of unknown type  . Neuromuscular disorder     Patient reports chronic numbness  in the right foot related to previous surgery on the right leg and "nerve damage"    Past Surgical History  Procedure Laterality Date  . Mastectomy  2002    bilateral  . Tubal ligation  1987  . Bladder surgery  2000  . Right leg surgery  2001    torn ligaments and tendons x4 surgeries  . Appendectomy  1974  . Cholecystectomy  1974  . Abdominal hysterectomy  2000  . Breast surgery      Bilateral mastectomy for left breast cancer unknown stage but patient reports positive nodes was treated with chemotherapy  . Abdominal hysterectomy    . Coronary artery bypass graft N/A 09/28/2012    Procedure: CORONARY ARTERY BYPASS GRAFTING (CABG);  Surgeon: Delight Ovens, MD;  Location: [facility] OR;  Service: Open Heart Surgery;  Laterality: N/A;  CABG x four, using left internal mammary artery and left leg greater saphenous vein harvested endoscopically  . Maze N/A 09/28/2012    Procedure: MAZE;  Surgeon: Delight Ovens, MD;  Location: [facility] [place] [facility] OR;  Service: Open Heart Surgery;  Laterality: N/A;  . Intraoperative transesophageal echocardiogram N/A 09/28/2012    Procedure: INTRAOPERATIVE TRANSESOPHAGEAL ECHOCARDIOGRAM;  Surgeon: Delight Ovens, MD;  Location: [facility] OR;  Service: Open Heart Surgery;  Laterality: N/A;  . Epicardial pacing lead placement N/A 09/28/2012    Procedure: EPICARDIAL PACING LEAD PLACEMENT;  Surgeon: Delight Ovens, MD;  Location: [facility] OR;  Service: Thoracic;  Laterality: N/A;  LV LEAD PLACEMENT    Family History  Problem Relation Age of Onset  . Kidney failure Mother   . Heart disease Mother     Died mid 60s complications of diabetes  . Arthritis Other   . Cancer Other     colon, ovarian  . Diabetes Other   . Hyperlipidemia Other   . Kidney failure Other   . Colon cancer Sister     History   Social History  . Marital Status: Married    Spouse Name: N/A    Number of Children: 3  . Years of Education: N/A   Occupational History  . Office work    Social History Main  Topics  . Smoking status: Former Smoker -- 1.00 packs/day for 10 years    Types: Cigarettes    Quit date: 03/11/2012  . Smokeless tobacco: Never Used     Comment: Quit in January  . Alcohol Use: No  . Drug Use: No  . Sexual Activity: Not on file   Other Topics Concern  . Not on file   Social History Narrative  Lives with husband and mother in law.      Review of systems: The patient specifically denies any chest pain at rest, dyspnea at rest, orthopnea, paroxysmal nocturnal dyspnea, syncope, palpitations, focal neurological deficits, intermittent claudication, lower extremity edema, unexplained weight gain, cough, hemoptysis or wheezing. Has dizziness and occasional chest pressure  The patient also denies abdominal pain, nausea, vomiting, dysphagia, diarrhea, constipation, polyuria, polydipsia, dysuria, hematuria, frequency, urgency, abnormal bleeding or bruising, fever, chills, unexpected weight changes, mood swings, change in skin or hair texture, change in voice quality, auditory or visual problems, allergic reactions or rashes, new musculoskeletal complaints other than usual "aches and pains".   PHYSICAL EXAM BP 100/62  Pulse 71  Ht 5' 4.5" (1.638 m)  Wt 154 lb 11.2 oz (70.171 kg)  BMI 26.15 kg/m2 General: Alert, oriented x3, no distress  Head: no evidence of trauma, PERRL, EOMI, no exophtalmos or lid lag, no myxedema, no xanthelasma; normal ears, nose and oropharynx  Neck: normal jugular venous pulsations and no hepatojugular reflux; brisk carotid pulses without delay and no carotid bruits  Chest: clear to auscultation, small area of reduced breath sounds and dullness to percussion in the left lower lobe, normal fremitus, symmetrical and full respiratory excursions, most of her sternotomy scar is well-healed but there is a 2-3 cm segment of redness and mild swelling  Cardiovascular: normal position and quality of the apical impulse, regular rhythm, normal first and second heart  sounds, no murmurs, rubs or gallops  Abdomen: no tenderness or distention, no masses by palpation, no abnormal pulsatility or arterial bruits, normal bowel sounds, no hepatosplenomegaly  Extremities: no clubbing, cyanosis or edema; 2+ radial, ulnar and brachial pulses bilaterally; 2+ right femoral, posterior tibial and dorsalis pedis pulses; 2+ left femoral, posterior tibial and dorsalis pedis pulses; no subclavian or femoral bruits  Neurological: grossly nonfocal   EKG: NSR, LBBB  Lipid Panel     Component Value Date/Time   CHOL 144 07/18/2012 0500   TRIG 530* 07/18/2012 0500   HDL 39* 07/18/2012 0500   CHOLHDL 3.7 07/18/2012 0500   VLDL UNABLE TO CALCULATE IF TRIGLYCERIDE OVER 400 mg/dL 1/61/0960 4540   LDLCALC UNABLE TO CALCULATE IF TRIGLYCERIDE OVER 400 mg/dL 9/81/1914 7829    BMET    Component Value Date/Time   NA 135 10/02/2012 0410   K 4.1 10/02/2012 0410   CL 98 10/02/2012 0410   CO2 31 10/02/2012 0410   GLUCOSE 177* 10/02/2012 0410   BUN 12 11/25/2012 1628   CREATININE 0.72 11/25/2012 1628   CREATININE 0.55 10/02/2012 0410   CALCIUM 7.7* 10/02/2012 0410   GFRNONAA >90 10/02/2012 0410   GFRAA >90 10/02/2012 0410     ASSESSMENT AND PLAN   I think some of her symptoms may be related to relative hypotension and side effects of amiodarone. No overt evidence of CHF. The soft tissue infection is probably contributing to her complaints. Recommended  the following change in\ medication:  Reduce amiodarone to 200 mg once daily only Reduce carvedilol to 6.25 mg (half of the 12.5 mg tablets) twice daily Schedule a follow-up appointment in: 3 months. Echocardiogram just before that followup appointment.  Regular exercise. Goal should be to walk 2 miles daily by the end of October.   Orders Placed This Encounter  Procedures  . EKG 12-Lead  . 2D Echocardiogram with contrast   Meds ordered this encounter  Medications  . DISCONTD: cephALEXin (KEFLEX) 500 MG capsule    Sig: Take 500 mg  by mouth  3 (three) times daily.   Marland Kitchen amiodarone (PACERONE) 200 MG tablet    Sig: Take 1 tablet (200 mg total) by mouth daily.    Dispense:  60 tablet    Refill:  3    Order Specific Question:  Supervising Provider    Answer:  Runell Gess [[id number]]  . carvedilol (COREG) 12.5 MG tablet    Sig: Take 0.5 tablets (6.25 mg total) by mouth 2 (two) times daily with a meal.    Dispense:  60 tablet    Refill:  1    Order Specific Question:  Supervising Provider    Answer:  Sheliah Plane B [[id number]]    Junious Silk, MD, [facility] [phone] office [phone] pager

## 2012-12-07 ENCOUNTER — Encounter (HOSPITAL_COMMUNITY): Payer: BC Managed Care – PPO

## 2012-12-07 ENCOUNTER — Other Ambulatory Visit: Payer: Self-pay | Admitting: *Deleted

## 2012-12-07 ENCOUNTER — Encounter (INDEPENDENT_AMBULATORY_CARE_PROVIDER_SITE_OTHER): Payer: BC Managed Care – PPO

## 2012-12-07 DIAGNOSIS — I2510 Atherosclerotic heart disease of native coronary artery without angina pectoris: Secondary | ICD-10-CM

## 2012-12-08 ENCOUNTER — Encounter (INDEPENDENT_AMBULATORY_CARE_PROVIDER_SITE_OTHER): Payer: BC Managed Care – PPO

## 2012-12-08 DIAGNOSIS — I2510 Atherosclerotic heart disease of native coronary artery without angina pectoris: Secondary | ICD-10-CM

## 2012-12-09 ENCOUNTER — Encounter (INDEPENDENT_AMBULATORY_CARE_PROVIDER_SITE_OTHER): Payer: BC Managed Care – PPO

## 2012-12-09 ENCOUNTER — Encounter (HOSPITAL_COMMUNITY): Payer: BC Managed Care – PPO

## 2012-12-09 DIAGNOSIS — I2510 Atherosclerotic heart disease of native coronary artery without angina pectoris: Secondary | ICD-10-CM

## 2012-12-10 ENCOUNTER — Ambulatory Visit: Payer: BC Managed Care – PPO | Admitting: Internal Medicine

## 2012-12-10 ENCOUNTER — Encounter (INDEPENDENT_AMBULATORY_CARE_PROVIDER_SITE_OTHER): Payer: BC Managed Care – PPO

## 2012-12-10 DIAGNOSIS — I2510 Atherosclerotic heart disease of native coronary artery without angina pectoris: Secondary | ICD-10-CM

## 2012-12-11 ENCOUNTER — Encounter: Payer: Self-pay | Admitting: Internal Medicine

## 2012-12-11 ENCOUNTER — Ambulatory Visit (INDEPENDENT_AMBULATORY_CARE_PROVIDER_SITE_OTHER): Payer: BC Managed Care – PPO | Admitting: *Deleted

## 2012-12-11 ENCOUNTER — Ambulatory Visit (INDEPENDENT_AMBULATORY_CARE_PROVIDER_SITE_OTHER): Payer: BC Managed Care – PPO | Admitting: Internal Medicine

## 2012-12-11 ENCOUNTER — Encounter (HOSPITAL_COMMUNITY): Payer: BC Managed Care – PPO

## 2012-12-11 VITALS — BP 104/58 | HR 79 | Temp 98.9°F | Resp 10 | Ht 64.0 in | Wt 156.0 lb

## 2012-12-11 DIAGNOSIS — E1065 Type 1 diabetes mellitus with hyperglycemia: Secondary | ICD-10-CM

## 2012-12-11 DIAGNOSIS — Z4800 Encounter for change or removal of nonsurgical wound dressing: Secondary | ICD-10-CM

## 2012-12-11 DIAGNOSIS — IMO0002 Reserved for concepts with insufficient information to code with codable children: Secondary | ICD-10-CM

## 2012-12-11 DIAGNOSIS — Z951 Presence of aortocoronary bypass graft: Secondary | ICD-10-CM

## 2012-12-11 DIAGNOSIS — I2510 Atherosclerotic heart disease of native coronary artery without angina pectoris: Secondary | ICD-10-CM

## 2012-12-11 MED ORDER — SITAGLIPTIN PHOSPHATE 100 MG PO TABS: 100.0000 mg | ORAL_TABLET | Freq: Every day | ORAL | Status: DC

## 2012-12-11 NOTE — Progress Notes (Signed)
Here again for her daily wound care of her sternal wound.  It continues to heal without signs of infection.  It is slowly becoming smaller. She has her first appointment with her endocrinologist today.  She will return as scheduled.

## 2012-12-11 NOTE — Patient Instructions (Addendum)
Please return in 1 month with your sugar log.  Stop Amaryl and start Januvia 100 mg daily in am before breakfast.   PATIENT INSTRUCTIONS FOR TYPE 2 DIABETES:  **Please join MyChart!** - see attached instructions about how to join   DIET AND EXERCISE Diet and exercise is an important part of diabetic treatment.  We recommended aerobic exercise in the form of brisk walking (working between 40-60% of maximal aerobic capacity, similar to brisk walking) for 150 minutes per week (such as 30 minutes five days per week) along with 3 times per week performing 'resistance' training (using various gauge rubber tubes with handles) 5-10 exercises involving the major muscle groups (upper body, lower body and core) performing 10-15 repetitions (or near fatigue) each exercise. Start at half the above goal but build slowly to reach the above goals. If limited by weight, joint pain, or disability, we recommend daily walking in a swimming pool with water up to waist to reduce pressure from joints while allow for adequate exercise.    BLOOD GLUCOSES Monitoring your blood glucoses is important for continued management of your diabetes. Please check your blood glucoses 2-4 times a day: fasting, before meals and at bedtime (you can rotate these measurements - e.g. one day check before the 3 meals, the next day check before 2 of the meals and before bedtime, etc.   HYPOGLYCEMIA (low blood sugar) Hypoglycemia is usually a reaction to not eating, exercising, or taking too much insulin/ other diabetes drugs.  Symptoms include tremors, sweating, hunger, confusion, headache, etc. Treat IMMEDIATELY with 15 grams of Carbs:   4 glucose tablets    cup regular juice/soda   2 tablespoons raisins   4 teaspoons sugar   1 tablespoon honey Recheck blood glucose in 15 mins and repeat above if still symptomatic/blood glucose <100. Please contact our office at [phone] if you have questions about how to next handle your  insulin.  RECOMMENDATIONS TO REDUCE YOUR RISK OF DIABETIC COMPLICATIONS: * Take your prescribed MEDICATION(S). * Follow a DIABETIC diet: Complex carbs, fiber rich foods, heart healthy fish twice weekly, (monounsaturated and polyunsaturated) fats * AVOID saturated/trans fats, high fat foods, >2,300 mg salt per day. * EXERCISE at least 5 times a week for 30 minutes or preferably daily.  * DO NOT SMOKE OR DRINK more than 1 drink a day. * Check your FEET every day. Do not wear tightfitting shoes. Contact us if you develop an ulcer * See your EYE doctor once a year or more if needed * Get a FLU shot once a year * Get a PNEUMONIA vaccine once before and once after age 4 years  GOALS:  * Your Hemoglobin A1c of <7%  * fasting sugars need to be <130 * after meals sugars need to be <180 (2h after you start eating) * Your Systolic BP should be 140 or lower  * Your Diastolic BP should be 80 or lower  * Your HDL (Good Cholesterol) should be 40 or higher  * Your LDL (Bad Cholesterol) should be 100 or lower  * Your Triglycerides should be 150 or lower  * Your Urine microalbumin (kidney function) should be <30 * Your Body Mass Index should be 25 or lower   We will be glad to help you achieve these goals. Our telephone number is: [phone].

## 2012-12-11 NOTE — Progress Notes (Signed)
Patient ID: [name], female   DOB: [birth year], 62 y.o.   MRN: [id number]  HPI: [name] is a 62 y.o.-year-old female, referred by Dr. Selena Batten, for management of DM2, insulin-dependent, uncontrolled, without complications.  Patient has been diagnosed with diabetes in 2002; she has been on insulin before >> hypolycemia. Last hemoglobin A1c was: Lab Results  Component Value Date   HGBA1C 9.1* 09/25/2012  Prev. 9.2%, prev. 9.4%.  Pt is on a regimen of: - Metformin 1000 mg po bid - Amaryl 4 mg bid  Pt checks her sugars 4x a day and they are: - am: 56-140 - before lunch: 80-90 - before dinner: 56-60s but sometimes 102 - bedtime: 80-110 No lows. Lowest sugar was 56; she has hypoglycemia awareness at 60. Highest sugar was 364 in last 6 months. In lats 2 weeks after icecream >> 218.  Pt started to change her diet after her last A1C returned high >> eats fresh vegetables mostly. Pt's meals are: - Breakfast: oatmeal + strawberries, dry cereals - Lunch: fruit salad - Dinner: salad + meat No regular exercise, but she has been referred to cardiac rehabilitation.  - no CKD, last BUN/creatinine:  Lab Results  Component Value Date   BUN 12 11/25/2012   CREATININE 0.72 11/25/2012  she is on losartan. - last set of lipids: Lab Results  Component Value Date   CHOL 144 07/18/2012   HDL 39* 07/18/2012   LDLCALC UNABLE TO CALCULATE IF TRIGLYCERIDE OVER 400 mg/dL 7/82/9562   LDLDIRECT 130.8 09/19/2009   TRIG 530* 07/18/2012   CHOLHDL 3.7 07/18/2012  she is on atorvastatin. - last eye exam was 3 years ago (cannot afford to have another one). No DR.  - no numbness and tingling in her feet. Last foot exam 11/04/2012 by Dr. Selena Batten. She is on aspirin 81.  Pt has FH of DM in sister.   ROS: Constitutional: no weight gain/loss, + fatigue, no subjective hyperthermia/hypothermia; + poor sleep Eyes: + blurry vision - needs new glasses, no xerophthalmia ENT: no sore throat, no nodules palpated in throat,  no dysphagia/odynophagia, no hoarseness Cardiovascular: +CP/+SOB/+palpitations/leg swelling Respiratory: no cough/+SOB Gastrointestinal: +N/+V/no D/C Musculoskeletal: no muscle/joint aches Skin: no rashes Neurological: no tremors/numbness/tingling/dizziness; + HA Psychiatric: no depression/anxiety  Past Medical History  Diagnosis Date  . Diabetes mellitus type II   . Hyperlipidemia   . Hypertension   . Allergic rhinitis   . Cataract   . CAD (coronary artery disease)     a. 2004: s/p MI in [place]. No PCI->Medical RX;  b. 07/2012 Cath: LM 30-40, LAD 70p, 70/72m, D1 80-90p, OM1 small 90p, OM2 large 80-90p, 91m, 70-80d, RCA 20-30 diff, EF 40%, glob HK.s/p CABG  . Ischemic cardiomyopathy     a. 07/2012 Echo: EF 35%, Sev inferoseptal HK, mildly dil LA, Peak PASP .  Marland Kitchen PAF (paroxysmal atrial fibrillation)     a. 07/2012: Amio and xarelto initiated.  Marland Kitchen LBBB (left bundle branch block)     a. intermittent - present during rapid afib 07/2012.  . Breast cancer 2002    Patient reports left breast cancer diagnosis in 2002 treated with bilateral mastectomy positive lymph nodes with left axillary dissection followed by chemotherapy of unknown type  . Neuromuscular disorder     Patient reports chronic numbness in the right foot related to previous surgery on the right leg and "nerve damage"   Past Surgical History  Procedure Laterality Date  . Mastectomy  2002    bilateral  . Tubal ligation  1987  . Bladder surgery  2000  . Right leg surgery  2001    torn ligaments and tendons x4 surgeries  . Appendectomy  1974  . Cholecystectomy  1974  . Abdominal hysterectomy  2000  . Breast surgery      Bilateral mastectomy for left breast cancer unknown stage but patient reports positive nodes was treated with chemotherapy  . Abdominal hysterectomy    . Coronary artery bypass graft N/A 09/28/2012    Procedure: CORONARY ARTERY BYPASS GRAFTING (CABG);  Surgeon: Delight Ovens, MD;  Location: [facility] OR;   Service: Open Heart Surgery;  Laterality: N/A;  CABG x four, using left internal mammary artery and left leg greater saphenous vein harvested endoscopically  . Maze N/A 09/28/2012    Procedure: MAZE;  Surgeon: Delight Ovens, MD;  Location: [facility] OR;  Service: Open Heart Surgery;  Laterality: N/A;  . Intraoperative transesophageal echocardiogram N/A 09/28/2012    Procedure: INTRAOPERATIVE TRANSESOPHAGEAL ECHOCARDIOGRAM;  Surgeon: Delight Ovens, MD;  Location: [facility] OR;  Service: Open Heart Surgery;  Laterality: N/A;  . Epicardial pacing lead placement N/A 09/28/2012    Procedure: EPICARDIAL PACING LEAD PLACEMENT;  Surgeon: Delight Ovens, MD;  Location: [facility] OR;  Service: Thoracic;  Laterality: N/A;  LV LEAD PLACEMENT   History   Social History  . Marital Status: Married    Spouse Name: N/A    Number of Children: 3   Occupational History  . Office work -Corporate treasurer. assistant    Social History Main Topics  . Smoking status: Former Smoker -- 1.00 packs/day for 10 years    Types: Cigarettes    Quit date: 03/11/2012  . Smokeless tobacco: Never Used     Comment: Quit in January  . Alcohol Use: No  . Drug Use: No  . Sexual Activity: Yes    Partners: Female   Social History Narrative   Lives with husband and mother in [place].     Regular exercise: walking   Caffeine use: 2 cups of coffee in the morning   Current Outpatient Prescriptions on File Prior to Visit  Medication Sig Dispense Refill  . amiodarone (PACERONE) 200 MG tablet Take 1 tablet (200 mg total) by mouth daily.  60 tablet  3  . aspirin 81 MG tablet Take 1 tablet (81 mg total) by mouth daily.  30 tablet    . atorvastatin (LIPITOR) 80 MG tablet Take 1 tablet (80 mg total) by mouth daily.  90 tablet  3  . Blood Glucose Monitoring Suppl (ONE TOUCH ULTRA SYSTEM KIT) W/DEVICE KIT 1 kit by Does not apply route once.        . carvedilol (COREG) 12.5 MG tablet Take 0.5 tablets (6.25 mg total) by mouth 2 (two) times daily with a meal.  60  tablet  1  . glucose blood test strip 1 each by Other route daily. One touch ultra 2 - dx 250.00  100 each  12  . losartan (COZAAR) 25 MG tablet Take 25 mg by mouth 2 (two) times daily.       . metFORMIN (GLUCOPHAGE) 1000 MG tablet TAKE ONE TABLET BY MOUTH TWICE DAILY WITH MEALS - NEED TO SCHEDULE OFFICE VISIT  180 tablet  1  . nitroGLYCERIN (NITROSTAT) 0.4 MG SL tablet Place 1 tablet (0.4 mg total) under the tongue every 5 (five) minutes as needed for chest pain.  25 tablet  3  . Rivaroxaban (XARELTO) 20 MG TABS tablet Take 1 tablet (20 mg total)  by mouth daily with supper.  30 tablet  0  . ciprofloxacin (CIPRO) 500 MG tablet Take 1 tablet (500 mg total) by mouth 2 (two) times daily.  25 tablet  1  . furosemide (LASIX) 20 MG tablet Take 1 tablet (20 mg total) by mouth daily.  30 tablet  1  . HYDROcodone-acetaminophen (NORCO/VICODIN) 5-325 MG per tablet Take 1 tablet by mouth every 6 (six) hours as needed for pain.  40 tablet  0   No current facility-administered medications on file prior to visit.   No Known Allergies  Family History  Problem Relation Age of Onset  . Kidney failure Mother   . Heart disease Mother     Died mid 54s complications of diabetes  . Arthritis Other   . Cancer Other     colon, ovarian  . Diabetes Other   . Hyperlipidemia Other   . Kidney failure Other   . Colon cancer Sister    PE: BP 104/58  Pulse 79  Temp(Src) 98.9 F (37.2 C) (Oral)  Resp 10  Ht 5\' 4"  (1.626 m)  Wt 156 lb (70.761 kg)  BMI 26.76 kg/m2  SpO2 98% Wt Readings from Last 3 Encounters:  12/11/12 156 lb (70.761 kg)  12/03/12 155 lb (70.308 kg)  11/26/12 155 lb (70.308 kg)   Constitutional: normal weight, in NAD Eyes: PERRLA, EOMI, no exophthalmos ENT: moist mucous membranes, no thyromegaly, no cervical lymphadenopathy Cardiovascular: RRR, 2/6 SEM, no RG Respiratory: CTA B Gastrointestinal: abdomen soft, NT, ND, BS+ Musculoskeletal: no deformities, strength intact in all 4 Skin:  moist, warm, no rashes Neurological: no tremor with outstretched hands, DTR normal in all 4  ASSESSMENT: 1. 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 Patient with long-standing, recently more uncontrolled diabetes, on oral antidiabetic regimen, with frequent low CBGs.  - We discussed about options for treatment, and I suggested to:  Stop Amaryl Start Januvia 100 mg Continue Metformin 1000 mg bid - advised her to start checking her sugars at different times of the day - check 2 times a day, rotating checks - given sugar log and advised how to fill it and to bring it at next appt  - given foot care handout and explained the principles  - given instructions for hypoglycemia management "15-15 rule"  - advised for yearly eye exams - refuses flu vaccine - Return to clinic in one month with sugar log

## 2012-12-13 ENCOUNTER — Encounter: Payer: Self-pay | Admitting: Internal Medicine

## 2012-12-13 ENCOUNTER — Encounter: Payer: Self-pay | Admitting: Cardiothoracic Surgery

## 2012-12-14 ENCOUNTER — Encounter (INDEPENDENT_AMBULATORY_CARE_PROVIDER_SITE_OTHER): Payer: BC Managed Care – PPO

## 2012-12-14 ENCOUNTER — Telehealth: Payer: Self-pay | Admitting: Family Medicine

## 2012-12-14 ENCOUNTER — Encounter (HOSPITAL_COMMUNITY): Payer: BC Managed Care – PPO

## 2012-12-14 DIAGNOSIS — I2510 Atherosclerotic heart disease of native coronary artery without angina pectoris: Secondary | ICD-10-CM

## 2012-12-14 MED ORDER — GLUCOSE BLOOD VI STRP: 1.0000 | ORAL_STRIP | Freq: Every day | Status: DC

## 2012-12-14 NOTE — Telephone Encounter (Signed)
New Rx sent to pharmacy

## 2012-12-14 NOTE — Telephone Encounter (Signed)
Pt called and stated that after speaking with you about her glucose blood test strip RX, she called [facility] to obtain a refill. Unfortunately, they had already transferred her RX to the [facility] that you had received a letter from. [facility] will need a new RX from [place], before filling any further prescriptions. Please assist.

## 2012-12-15 ENCOUNTER — Encounter (INDEPENDENT_AMBULATORY_CARE_PROVIDER_SITE_OTHER): Payer: BC Managed Care – PPO

## 2012-12-15 ENCOUNTER — Encounter: Payer: Self-pay | Admitting: Family Medicine

## 2012-12-15 DIAGNOSIS — I2510 Atherosclerotic heart disease of native coronary artery without angina pectoris: Secondary | ICD-10-CM

## 2012-12-16 ENCOUNTER — Ambulatory Visit (INDEPENDENT_AMBULATORY_CARE_PROVIDER_SITE_OTHER): Payer: BC Managed Care – PPO | Admitting: *Deleted

## 2012-12-16 ENCOUNTER — Encounter (HOSPITAL_COMMUNITY): Payer: BC Managed Care – PPO

## 2012-12-16 DIAGNOSIS — T8189XD Other complications of procedures, not elsewhere classified, subsequent encounter: Secondary | ICD-10-CM

## 2012-12-16 DIAGNOSIS — Z4800 Encounter for change or removal of nonsurgical wound dressing: Secondary | ICD-10-CM

## 2012-12-16 DIAGNOSIS — I2510 Atherosclerotic heart disease of native coronary artery without angina pectoris: Secondary | ICD-10-CM

## 2012-12-16 DIAGNOSIS — Z951 Presence of aortocoronary bypass graft: Secondary | ICD-10-CM

## 2012-12-16 MED ORDER — GLUCOSE BLOOD VI STRP: ORAL_STRIP | Status: DC

## 2012-12-17 ENCOUNTER — Other Ambulatory Visit: Payer: Self-pay | Admitting: *Deleted

## 2012-12-17 ENCOUNTER — Ambulatory Visit (INDEPENDENT_AMBULATORY_CARE_PROVIDER_SITE_OTHER): Payer: BC Managed Care – PPO | Admitting: Cardiothoracic Surgery

## 2012-12-17 ENCOUNTER — Encounter: Payer: Self-pay | Admitting: Cardiothoracic Surgery

## 2012-12-17 ENCOUNTER — Encounter: Payer: Self-pay | Admitting: Family Medicine

## 2012-12-17 ENCOUNTER — Ambulatory Visit
Admission: RE | Admit: 2012-12-17 | Discharge: 2012-12-17 | Disposition: A | Discharge status: Home / Self Care | Payer: BC Managed Care – PPO | Source: Ambulatory Visit | Attending: Cardiothoracic Surgery | Admitting: Cardiothoracic Surgery

## 2012-12-17 VITALS — BP 112/69 | HR 79 | Resp 20 | Ht 64.0 in | Wt 156.0 lb

## 2012-12-17 DIAGNOSIS — I2510 Atherosclerotic heart disease of native coronary artery without angina pectoris: Secondary | ICD-10-CM

## 2012-12-17 DIAGNOSIS — R0609 Other forms of dyspnea: Secondary | ICD-10-CM

## 2012-12-17 DIAGNOSIS — R0600 Dyspnea, unspecified: Secondary | ICD-10-CM

## 2012-12-17 DIAGNOSIS — Z951 Presence of aortocoronary bypass graft: Secondary | ICD-10-CM

## 2012-12-17 DIAGNOSIS — Z5189 Encounter for other specified aftercare: Secondary | ICD-10-CM

## 2012-12-17 DIAGNOSIS — J90 Pleural effusion, not elsewhere classified: Secondary | ICD-10-CM

## 2012-12-17 NOTE — Progress Notes (Signed)
[address]       [place] [address]             [phone]                  [name] [facility] Medical Record #[id number] Date of Birth: December 05, 1950  Dr Royann Shivers  Evette Georges, MD  Chief Complaint:   PostOp Follow Up Visit 09/28/2012  PREOPERATIVE DIAGNOSES: Coronary occlusive disease and paroxysmal  atrial fibrillation.  POSTOPERATIVE DIAGNOSES: Coronary occlusive disease and paroxysmal  atrial fibrillation.  SURGICAL PROCEDURE:  1. Coronary artery bypass grafting x4 with the left internal mammary  to the left anterior descending coronary artery sequential reverse  saphenous vein graft to the first and second obtuse marginal,  reverse saphenous vein graft to the intermediate coronary artery.  2. Left-sided maze procedure, placement of 35-mm left atrial clip and  placement of permanent left ventricular pacing lead.  3. Left leg endo vein harvesting.  SURGEON: Sheliah Plane, MD   History of Present Illness:     Patient returns to the office today for followup visit after  cardiac surgery. She's been coming to the office on a daily basis for wound dressing changes for a superficial sternal wound cellulitis. She is completed her course of antibiotics the area of the sternum had been improving., Now almost completely healed  The patient is returned to part-time work. She still notes fatigue in easily, and sometimes with activity gets mildly short of breath. She continues on Lasix on a daily basis. Preoperatively she was noted to have significant LV dysfunction in the 30% range which may be contributing to her mild heart failure symptoms.   History  Smoking status  . Former Smoker -- 1.00 packs/day for 10 years  . Types: Cigarettes  . Quit date: 03/11/2012  Smokeless tobacco  . Never Used    Comment: Quit in January       No Known Allergies  Current Outpatient Prescriptions  Medication Sig Dispense Refill  .  amiodarone (PACERONE) 200 MG tablet Take 1 tablet (200 mg total) by mouth daily.  60 tablet  3  . aspirin 81 MG tablet Take 1 tablet (81 mg total) by mouth daily.  30 tablet    . atorvastatin (LIPITOR) 80 MG tablet Take 1 tablet (80 mg total) by mouth daily.  90 tablet  3  . Blood Glucose Monitoring Suppl (ONE TOUCH ULTRA SYSTEM KIT) W/DEVICE KIT 1 kit by Does not apply route once.        . carvedilol (COREG) 12.5 MG tablet Take 0.5 tablets (6.25 mg total) by mouth 2 (two) times daily with a meal.  60 tablet  1  . furosemide (LASIX) 20 MG tablet Take 1 tablet (20 mg total) by mouth daily.  30 tablet  1  . glimepiride (AMARYL) 4 MG tablet Take 4 mg by mouth daily before breakfast.      . glucose blood test strip One touch ultra 2 - dx 250.00 twice daily  200 each  12  . HYDROcodone-acetaminophen (NORCO/VICODIN) 5-325 MG per tablet Take 1 tablet by mouth every 6 (six) hours as needed for pain.  40 tablet  0  . losartan (COZAAR) 25 MG tablet Take 25 mg by mouth 2 (two) times daily.       . metFORMIN (GLUCOPHAGE) 1000 MG tablet TAKE ONE TABLET BY MOUTH TWICE DAILY WITH MEALS - NEED  TO SCHEDULE OFFICE VISIT  180 tablet  1  . nitroGLYCERIN (NITROSTAT) 0.4 MG SL tablet Place 1 tablet (0.4 mg total) under the tongue every 5 (five) minutes as needed for chest pain.  25 tablet  3  . Rivaroxaban (XARELTO) 20 MG TABS tablet Take 1 tablet (20 mg total) by mouth daily with supper.  30 tablet  0   No current facility-administered medications for this visit.       Physical Exam: BP 112/69  Pulse 79  Resp 20  Ht 5\' 4"  (1.626 m)  Wt 156 lb (70.761 kg)  BMI 26.76 kg/m2  SpO2 99%  General appearance: alert and cooperative Neurologic: intact Heart: regular rate and rhythm, S1, S2 normal, no murmur, click, rub or gallop Lungs: diminished breath sounds LLL Abdomen: soft, non-tender; bowel sounds normal; no masses,  no organomegaly Extremities: extremities normal, atraumatic, no cyanosis or edema and  Homans sign is negative, no sign of DVT Wound:  The wound on the chest his almost healed, just the tip of cotton swab will now go in the wound, there is no surrounding erythema The left knee incision is completely healed without erythema.  Diagnostic Studies & Laboratory data:         Recent Radiology Findings: Dg Chest 2 View  12/17/2012   CLINICAL DATA:  FOLLOWUP CABG IN JULY OF 2014, SHORTNESS OF BREATH  EXAM: CHEST  2 VIEW  COMPARISON:  THE CHEST X-RAY OF 11/20/2012 AND CT CHEST OF 11/26/2012  FINDINGS: THE TINY RIGHT PLEURAL EFFUSION HAS RESOLVED. THE SMALL LEFT PLEURAL EFFUSION HAS DIMINISHED IN SIZE WITH IMPROVED AERATION OF THE LEFT LUNG BASE. CARDIOMEGALY IS STABLE. MEDIAN STERNOTOMY SUTURES ARE NOTED FROM PRIOR CABG. A LEFT ATRIAL APPENDAGE EXCLUSION DEVICE AGAIN IS NOTED. SURGICAL CLIPS OVERLIE THE RIGHT AXILLA.  IMPRESSION: RESOLUTION OF SMALL RIGHT EFFUSION WITH DECREASE IN SIZE OF SMALL LEFT EFFUSION.   Electronically Signed   By: Dwyane Dee M.D.   On: 12/17/2012 13:58      Recent Labs: Lab Results  Component Value Date   WBC 11.4* 10/02/2012   HGB 11.3* 10/02/2012   HCT 33.9* 10/02/2012   PLT 188 10/02/2012   GLUCOSE 177* 10/02/2012   CHOL 144 07/18/2012   TRIG 530* 07/18/2012   HDL 39* 07/18/2012   LDLDIRECT 134.3 09/19/2009   LDLCALC UNABLE TO CALCULATE IF TRIGLYCERIDE OVER 400 mg/dL 04/10/8655   ALT 22 8/46/9629   AST 14 09/25/2012   NA 135 10/02/2012   K 4.1 10/02/2012   CL 98 10/02/2012   CREATININE 0.72 11/25/2012   BUN 12 11/25/2012   CO2 31 10/02/2012   TSH 2.447 07/18/2012   INR 1.55* 09/28/2012   HGBA1C 9.1* 09/25/2012   Rhythm strip in the office confirms sinus rhythm  Assessment / Plan:   Superficial sternal wound infection  now almost completely healed Chest x-ray is clear without significant pleural effusion With her preoperative significant LV dysfunction and now mild symptoms of heart failure, it may be prudent to proceed with followup echocardiogram for  evaluation of LV dysfunction earlier than December. I do plan to see her back in one month.    [name] 12/17/2012 2:28 PM

## 2012-12-17 NOTE — Progress Notes (Signed)
She returns for wound care to her sternal wound.  At this point I am only able to probe 1/2 of the of a Q-tip into the site.  Standard wet -dry NS wound care was performed .  She will return tomorrow and see Dr. Tyrone Sage with a cxr to discuss her continued shortness of breath.

## 2012-12-17 NOTE — Progress Notes (Signed)
I agree, will schedule.

## 2012-12-18 ENCOUNTER — Ambulatory Visit (INDEPENDENT_AMBULATORY_CARE_PROVIDER_SITE_OTHER): Payer: BC Managed Care – PPO | Admitting: *Deleted

## 2012-12-18 ENCOUNTER — Encounter (HOSPITAL_COMMUNITY): Payer: BC Managed Care – PPO

## 2012-12-18 DIAGNOSIS — I2510 Atherosclerotic heart disease of native coronary artery without angina pectoris: Secondary | ICD-10-CM

## 2012-12-18 DIAGNOSIS — T8189XD Other complications of procedures, not elsewhere classified, subsequent encounter: Secondary | ICD-10-CM

## 2012-12-18 NOTE — Progress Notes (Signed)
Echo order entered per Dr. Salena Saner. For dyspnea.

## 2012-12-18 NOTE — Progress Notes (Signed)
Here again for w-d n/s dressings to sternal wound. Almost completely healed and only able to pack with a very small amount of gauze. She will return as scheduled.

## 2012-12-21 ENCOUNTER — Encounter (INDEPENDENT_AMBULATORY_CARE_PROVIDER_SITE_OTHER): Payer: BC Managed Care – PPO

## 2012-12-21 ENCOUNTER — Encounter (HOSPITAL_COMMUNITY): Payer: BC Managed Care – PPO

## 2012-12-21 DIAGNOSIS — I2510 Atherosclerotic heart disease of native coronary artery without angina pectoris: Secondary | ICD-10-CM

## 2012-12-22 ENCOUNTER — Encounter (INDEPENDENT_AMBULATORY_CARE_PROVIDER_SITE_OTHER): Payer: BC Managed Care – PPO

## 2012-12-22 ENCOUNTER — Encounter: Payer: Self-pay | Admitting: Family Medicine

## 2012-12-22 DIAGNOSIS — I2510 Atherosclerotic heart disease of native coronary artery without angina pectoris: Secondary | ICD-10-CM

## 2012-12-23 ENCOUNTER — Encounter (HOSPITAL_COMMUNITY): Payer: BC Managed Care – PPO

## 2012-12-23 ENCOUNTER — Encounter (INDEPENDENT_AMBULATORY_CARE_PROVIDER_SITE_OTHER): Payer: BC Managed Care – PPO

## 2012-12-23 DIAGNOSIS — I2510 Atherosclerotic heart disease of native coronary artery without angina pectoris: Secondary | ICD-10-CM

## 2012-12-24 ENCOUNTER — Encounter (INDEPENDENT_AMBULATORY_CARE_PROVIDER_SITE_OTHER): Payer: BC Managed Care – PPO

## 2012-12-24 DIAGNOSIS — I2510 Atherosclerotic heart disease of native coronary artery without angina pectoris: Secondary | ICD-10-CM

## 2012-12-25 ENCOUNTER — Encounter (INDEPENDENT_AMBULATORY_CARE_PROVIDER_SITE_OTHER): Payer: BC Managed Care – PPO

## 2012-12-25 ENCOUNTER — Encounter (HOSPITAL_COMMUNITY): Payer: BC Managed Care – PPO

## 2012-12-25 DIAGNOSIS — I2510 Atherosclerotic heart disease of native coronary artery without angina pectoris: Secondary | ICD-10-CM

## 2012-12-28 ENCOUNTER — Encounter (HOSPITAL_COMMUNITY): Payer: BC Managed Care – PPO

## 2012-12-28 ENCOUNTER — Ambulatory Visit (INDEPENDENT_AMBULATORY_CARE_PROVIDER_SITE_OTHER): Payer: BC Managed Care – PPO | Admitting: *Deleted

## 2012-12-28 DIAGNOSIS — I2510 Atherosclerotic heart disease of native coronary artery without angina pectoris: Secondary | ICD-10-CM

## 2012-12-28 DIAGNOSIS — T8189XD Other complications of procedures, not elsewhere classified, subsequent encounter: Secondary | ICD-10-CM

## 2012-12-28 DIAGNOSIS — Z951 Presence of aortocoronary bypass graft: Secondary | ICD-10-CM

## 2012-12-28 NOTE — Progress Notes (Signed)
She returns for wound care of her sternal incision. It is nearly closed...only able get 1/2 of the head of a Q tip into the wound, which is clean. Wound care will be continued until the end of the week.  If it is not completely healed at this point, she will be instructed to begin self care for the remainder of the healing precess.

## 2012-12-29 ENCOUNTER — Encounter: Payer: Self-pay | Admitting: Cardiovascular Disease

## 2012-12-29 ENCOUNTER — Encounter (INDEPENDENT_AMBULATORY_CARE_PROVIDER_SITE_OTHER): Payer: BC Managed Care – PPO

## 2012-12-29 ENCOUNTER — Encounter: Payer: Self-pay | Admitting: Cardiothoracic Surgery

## 2012-12-29 DIAGNOSIS — I2510 Atherosclerotic heart disease of native coronary artery without angina pectoris: Secondary | ICD-10-CM

## 2012-12-30 ENCOUNTER — Encounter (INDEPENDENT_AMBULATORY_CARE_PROVIDER_SITE_OTHER): Payer: BC Managed Care – PPO

## 2012-12-30 ENCOUNTER — Encounter (HOSPITAL_COMMUNITY): Payer: BC Managed Care – PPO

## 2012-12-30 DIAGNOSIS — I2510 Atherosclerotic heart disease of native coronary artery without angina pectoris: Secondary | ICD-10-CM

## 2012-12-30 NOTE — Telephone Encounter (Signed)
Call to pt and verified x 2.  Pt informed MyChart message received and required a call vs e-mail communication.  Pt c/o having SOB when not taking lasix 20 mg.  Stated Dr. Tyrone Sage orders it and she takes it, but then the SOB comes back.  Pt also c/o feeling pressure in her head when she bends over.  Pt informed she may have some orthostatic hypotension and advised to change positions slowly.  Pt also informed Dr. Royann Shivers will be notified for further instructions as chart indicates she was supposed to have an echo ordered sooner than Dec. 2014.  Pt verbalized understanding and agreed w/ plan.  Dr. Royann Shivers notified and advised pt increase lasix to 40 mg daily and echo ASAP.  Pt informed, verbalized understanding and agreed w/ plan.  Echo scheduled for tomorrow (10.23.14) at 1pm.

## 2012-12-31 ENCOUNTER — Ambulatory Visit (HOSPITAL_COMMUNITY)
Admission: RE | Admit: 2012-12-31 | Discharge: 2012-12-31 | Disposition: A | Discharge status: Home / Self Care | Payer: BC Managed Care – PPO | Source: Ambulatory Visit | Attending: Cardiology | Admitting: Cardiology

## 2012-12-31 ENCOUNTER — Encounter: Payer: Self-pay | Admitting: Cardiovascular Disease

## 2012-12-31 DIAGNOSIS — I079 Rheumatic tricuspid valve disease, unspecified: Secondary | ICD-10-CM | POA: Insufficient documentation

## 2012-12-31 DIAGNOSIS — I059 Rheumatic mitral valve disease, unspecified: Secondary | ICD-10-CM | POA: Insufficient documentation

## 2012-12-31 DIAGNOSIS — R079 Chest pain, unspecified: Secondary | ICD-10-CM | POA: Insufficient documentation

## 2012-12-31 DIAGNOSIS — I5043 Acute on chronic combined systolic (congestive) and diastolic (congestive) heart failure: Secondary | ICD-10-CM

## 2012-12-31 DIAGNOSIS — I509 Heart failure, unspecified: Secondary | ICD-10-CM

## 2012-12-31 DIAGNOSIS — I2510 Atherosclerotic heart disease of native coronary artery without angina pectoris: Secondary | ICD-10-CM

## 2012-12-31 DIAGNOSIS — I4891 Unspecified atrial fibrillation: Secondary | ICD-10-CM | POA: Insufficient documentation

## 2012-12-31 NOTE — Progress Notes (Signed)
2D Echo Performed 12/31/2012    Khaleel Beckom, RCS  

## 2013-01-01 ENCOUNTER — Encounter (HOSPITAL_COMMUNITY): Payer: BC Managed Care – PPO

## 2013-01-01 ENCOUNTER — Telehealth: Payer: Self-pay | Admitting: *Deleted

## 2013-01-01 MED ORDER — FUROSEMIDE 40 MG PO TABS: 40.0000 mg | ORAL_TABLET | Freq: Every day | ORAL | Status: DC

## 2013-01-01 NOTE — Telephone Encounter (Signed)
Pt needs a refill on her Lasix. She stated that she doesn't have a Rx for her 40mg  pills.

## 2013-01-01 NOTE — Telephone Encounter (Signed)
Returned call and pt verified.  Pt informed refill will be sent.  Need pharmacy.  Pt uses [facility] [address] and informed Refill(s) sent to pharmacy.  Pt verbalized understanding and agreed w/ plan.  Pt also asked if she needed to be seen sooner b/c of echo.  Reviewed chart for results and pt informed per MD (see result note).  Appt scheduled for 10.31.14 at 4:15pm w/ Dr. Royann Shivers.

## 2013-01-04 ENCOUNTER — Encounter (HOSPITAL_COMMUNITY): Payer: BC Managed Care – PPO

## 2013-01-06 ENCOUNTER — Encounter (HOSPITAL_COMMUNITY): Payer: BC Managed Care – PPO

## 2013-01-06 ENCOUNTER — Encounter: Payer: Self-pay | Admitting: Internal Medicine

## 2013-01-08 ENCOUNTER — Ambulatory Visit (INDEPENDENT_AMBULATORY_CARE_PROVIDER_SITE_OTHER): Payer: BC Managed Care – PPO | Admitting: Cardiovascular Disease

## 2013-01-08 ENCOUNTER — Encounter: Payer: Self-pay | Admitting: Cardiovascular Disease

## 2013-01-08 ENCOUNTER — Encounter (HOSPITAL_COMMUNITY): Payer: BC Managed Care – PPO

## 2013-01-08 VITALS — BP 118/82 | HR 80 | Ht 64.5 in | Wt 150.3 lb

## 2013-01-08 DIAGNOSIS — IMO0002 Reserved for concepts with insufficient information to code with codable children: Secondary | ICD-10-CM

## 2013-01-08 DIAGNOSIS — I5022 Chronic systolic (congestive) heart failure: Secondary | ICD-10-CM

## 2013-01-08 DIAGNOSIS — I509 Heart failure, unspecified: Secondary | ICD-10-CM

## 2013-01-08 DIAGNOSIS — I5042 Chronic combined systolic (congestive) and diastolic (congestive) heart failure: Secondary | ICD-10-CM

## 2013-01-08 DIAGNOSIS — I4891 Unspecified atrial fibrillation: Secondary | ICD-10-CM

## 2013-01-08 DIAGNOSIS — E785 Hyperlipidemia, unspecified: Secondary | ICD-10-CM

## 2013-01-08 DIAGNOSIS — I2589 Other forms of chronic ischemic heart disease: Secondary | ICD-10-CM

## 2013-01-08 DIAGNOSIS — E1065 Type 1 diabetes mellitus with hyperglycemia: Secondary | ICD-10-CM

## 2013-01-08 DIAGNOSIS — I2510 Atherosclerotic heart disease of native coronary artery without angina pectoris: Secondary | ICD-10-CM

## 2013-01-08 HISTORY — DX: Chronic combined systolic (congestive) and diastolic (congestive) heart failure: I50.42

## 2013-01-08 MED ORDER — RIVAROXABAN 20 MG PO TABS: 20.0000 mg | ORAL_TABLET | Freq: Every day | ORAL | Status: DC

## 2013-01-08 NOTE — Progress Notes (Signed)
Patient ID: [name], female   DOB: 09/30/1950, 62 y.o.   MRN: [id number]      Reason for office visit Followup CAD, CHF, atrial fibrillation  3 months after bypass surgery Mrs. Sutphin continues to have symptoms of congestive heart failure. Sometimes simply washing dishes makes her short of breath. A typical day her functional status will be in one HA class 2-3. Attempts to stop diuretic therapy have been associated with worsening dyspnea. Followup echocardiography shows that the left ventricular ejection fraction remains moderately depressed around 35-40%. She has prominent systolic asynchrony, secondary to a very broad left bundle branch block with a QRS duration of 184 ms. She does not have angina pectoris. She had a superficial sternotomy wound infection that is almost completely healed. There is no evidence of deep tissue infection. She had postoperative atrial fibrillation and has been receiving amiodarone and Xarelto. Has not had any palpitations or other signs of atrial fibrillation and at least 8 weeks   No Known Allergies  Current Outpatient Prescriptions  Medication Sig Dispense Refill  . aspirin 81 MG tablet Take 1 tablet (81 mg total) by mouth daily.  30 tablet    . Blood Glucose Monitoring Suppl (ONE TOUCH ULTRA SYSTEM KIT) W/DEVICE KIT 1 kit by Does not apply route once.        . carvedilol (COREG) 12.5 MG tablet Take 0.5 tablets (6.25 mg total) by mouth 2 (two) times daily with a meal.  60 tablet  1  . furosemide (LASIX) 40 MG tablet Take 1 tablet (40 mg total) by mouth daily.  30 tablet  1  . glimepiride (AMARYL) 4 MG tablet Take 4 mg by mouth daily before breakfast.      . glucose blood test strip One touch ultra 2 - dx 250.00 twice daily  200 each  12  . losartan (COZAAR) 25 MG tablet Take 25 mg by mouth 2 (two) times daily.       . metFORMIN (GLUCOPHAGE) 1000 MG tablet TAKE ONE TABLET BY MOUTH TWICE DAILY WITH MEALS - NEED TO SCHEDULE OFFICE VISIT  180 tablet  1  .  nitroGLYCERIN (NITROSTAT) 0.4 MG SL tablet Place 1 tablet (0.4 mg total) under the tongue every 5 (five) minutes as needed for chest pain.  25 tablet  3  . Rivaroxaban (XARELTO) 20 MG TABS tablet Take 1 tablet (20 mg total) by mouth daily with supper.  30 tablet  0  . atorvastatin (LIPITOR) 80 MG tablet Take 1 tablet (80 mg total) by mouth daily.  90 tablet  3   No current facility-administered medications for this visit.    Past Medical History  Diagnosis Date  . Diabetes mellitus type II   . Hyperlipidemia   . Hypertension   . Allergic rhinitis   . Cataract   . CAD (coronary artery disease)     a. 2004: s/p MI in [place]. No PCI->Medical RX;  b. 07/2012 Cath: LM 30-40, LAD 70p, 70/69m, D1 80-90p, OM1 small 90p, OM2 large 80-90p, 62m, 70-80d, RCA 20-30 diff, EF 40%, glob HK.s/p CABG  . Ischemic cardiomyopathy     a. 07/2012 Echo: EF 35%, Sev inferoseptal HK, mildly dil LA, Peak PASP .  Marland Kitchen PAF (paroxysmal atrial fibrillation)     a. 07/2012: Amio and xarelto initiated.  Marland Kitchen LBBB (left bundle branch block)     a. intermittent - present during rapid afib 07/2012.  . Breast cancer 2002    Patient reports left breast cancer diagnosis in  2002 treated with bilateral mastectomy positive lymph nodes with left axillary dissection followed by chemotherapy of unknown type  . Neuromuscular disorder     Patient reports chronic numbness in the right foot related to previous surgery on the right leg and "nerve damage"    Past Surgical History  Procedure Laterality Date  . Mastectomy  2002    bilateral  . Tubal ligation  1987  . Bladder surgery  2000  . Right leg surgery  2001    torn ligaments and tendons x4 surgeries  . Appendectomy  1974  . Cholecystectomy  1974  . Abdominal hysterectomy  2000  . Breast surgery      Bilateral mastectomy for left breast cancer unknown stage but patient reports positive nodes was treated with chemotherapy  . Abdominal hysterectomy    . Coronary artery  bypass graft N/A 09/28/2012    Procedure: CORONARY ARTERY BYPASS GRAFTING (CABG);  Surgeon: Delight Ovens, MD;  Location: [facility] OR;  Service: Open Heart Surgery;  Laterality: N/A;  CABG x four, using left internal mammary artery and left leg greater saphenous vein harvested endoscopically  . Maze N/A 09/28/2012    Procedure: MAZE;  Surgeon: Delight Ovens, MD;  Location: [facility] OR;  Service: Open Heart Surgery;  Laterality: N/A;  . Intraoperative transesophageal echocardiogram N/A 09/28/2012    Procedure: INTRAOPERATIVE TRANSESOPHAGEAL ECHOCARDIOGRAM;  Surgeon: Delight Ovens, MD;  Location[facility] OR;  Service: Open Heart Surgery;  Laterality: N/A;  . Epicardial pacing lead placement N/A 09/28/2012    Procedure: EPICARDIAL PACING LEAD PLACEMENT;  Surgeon: Delight Ovens, MD;  Location: [facility] OR;  Service: Thoracic;  Laterality: N/A;  LV LEAD PLACEMENT    Family History  Problem Relation Age of Onset  . Kidney failure Mother   . Heart disease Mother     Died mid 12s complications of diabetes  . Arthritis Other   . Cancer Other     colon, ovarian  . Diabetes Other   . Hyperlipidemia Other   . Kidney failure Other   . Colon cancer Sister     History   Social History  . Marital Status: Married    Spouse Name: N/A    Number of Children: 3  . Years of Education: N/A   Occupational History  . Office work    Social History Main Topics  . Smoking status: Former Smoker -- 1.00 packs/day for 10 years    Types: Cigarettes    Quit date: 03/11/2012  . Smokeless tobacco: Never Used     Comment: Quit in January  . Alcohol Use: No  . Drug Use: No  . Sexual Activity: Yes    Partners: Female   Other Topics Concern  . Not on file   Social History Narrative   Lives with husband and mother in law.     Regular exercise: walking   Caffeine use: 2 cups of coffee in the morning    Review of systems: Some days with class III dyspnea on exertion, others class II. Edema has resolved. No angina  either at rest or with exertion The patient specifically denies orthopnea, paroxysmal nocturnal dyspnea, syncope, palpitations, focal neurological deficits, intermittent claudication, lower extremity edema, unexplained weight gain, cough, hemoptysis or wheezing.  The patient also denies abdominal pain, nausea, vomiting, dysphagia, diarrhea, constipation, polyuria, polydipsia, dysuria, hematuria, frequency, urgency, abnormal bleeding or bruising, fever, chills, unexpected weight changes, mood swings, change in skin or hair texture, change in voice quality, auditory or visual  problems, allergic reactions or rashes, new musculoskeletal complaints other than usual "aches and pains".    PHYSICAL EXAM BP 118/82  Pulse 80  Ht 5' 4.5" (1.638 m)  Wt 150 lb 4.8 oz (68.176 kg)  BMI 25.41 kg/m2  General: Alert, oriented x3, no distress Head: no evidence of trauma, PERRL, EOMI, no exophtalmos or lid lag, no myxedema, no xanthelasma; normal ears, nose and oropharynx Neck: normal jugular venous pulsations and no hepatojugular reflux; brisk carotid pulses without delay and no carotid bruits Chest: clear to auscultation, no signs of consolidation by percussion or palpation, normal fremitus, symmetrical and full respiratory excursions,  The sternotomy scar is almost completely healed. There is one area of crusty scab without drainage there is no more than 1-2 cm in line towards the inferior end of the scar. Cardiovascular: normal position and quality of the apical impulse, regular rhythm, normal first and paradoxically split second heart sounds, no murmurs, rubs or gallops Abdomen: no tenderness or distention, no masses by palpation, no abnormal pulsatility or arterial bruits, normal bowel sounds, no hepatosplenomegaly Extremities: no clubbing, cyanosis or edema; 2+ radial, ulnar and brachial pulses bilaterally; 2+ right femoral, posterior tibial and dorsalis pedis pulses; 2+ left femoral, posterior tibial and  dorsalis pedis pulses; no subclavian or femoral bruits Neurological: grossly nonfocal   EKG: NSR rhythm, LBBB with QRS duration 184 ms  Lipid Panel     Component Value Date/Time   CHOL 144 07/18/2012 0500   TRIG 530* 07/18/2012 0500   HDL 39* 07/18/2012 0500   CHOLHDL 3.7 07/18/2012 0500   VLDL UNABLE TO CALCULATE IF TRIGLYCERIDE OVER 400 mg/dL 1/61/0960 4540   LDLCALC UNABLE TO CALCULATE IF TRIGLYCERIDE OVER 400 mg/dL 9/81/1914 7829    BMET    Component Value Date/Time   NA 135 10/02/2012 0410   K 4.1 10/02/2012 0410   CL 98 10/02/2012 0410   CO2 31 10/02/2012 0410   GLUCOSE 177* 10/02/2012 0410   BUN 12 11/25/2012 1628   CREATININE 0.72 11/25/2012 1628   CREATININE 0.55 10/02/2012 0410   CALCIUM 7.7* 10/02/2012 0410   GFRNONAA >90 10/02/2012 0410   GFRAA >90 10/02/2012 0410     ASSESSMENT AND PLAN CAD (coronary artery disease) Now roughly 3 months status post bypass surgery on maximum tolerated dose of ARB and beta blocker she continues to have moderately depressed left ventricular systolic function with an ejection fraction of 35-40%. It is unlikely we'll see further improvement in LVEF. She does not have angina pectoris.  PAF (paroxysmal atrial fibrillation) No clinically detected recurrence of arrhythmia since the immediate postoperative period. Will stop amiodarone today. Recommend 2 week event monitor. If no atrial fibrillation is detected we'll stop anticoagulation.  Chronic combined systolic and diastolic CHF, NYHA class 3 Despite appropriate medical therapy she continues to require diuretics and has class 2-3 heart failure. Sometimes just washing dishes makes her short of breath. She has a left bundle branch block with a broad QRS at over 180 ms. She will benefit from cardiac resynchronization therapy. Her ejection fraction is right around 35% and the indication for ICD therapy is marginal. She already has a left ventricular epicardial lead that was placed at the time of bypass  surgery. Will refer to discuss CRT-P versus CRT-D with Dr. Sharrell Ku.   DIABETES MELLITUS TYPE 1-UNCONTROLLED Seeing Dr. Enrique Sack Will plan to reassess lipid profile once we have her glycemic control in the desirable range. Suspect her hypertriglyceridemia will be much better than.  Orders Placed This Encounter  Procedures  . Ambulatory referral to Cardiac Electrophysiology  . EKG 12-Lead  . Cardiac event monitor   No orders of the defined types were placed in this encounter.    Junious Silk, MD, [facility] [phone] office [phone] pager

## 2013-01-08 NOTE — Patient Instructions (Signed)
Your physician has recommended that you wear an event monitor. Event monitors are medical devices that record the heart's electrical activity. Doctors most often [place] these monitors to diagnose arrhythmias. Arrhythmias are problems with the speed or rhythm of the heartbeat. The monitor is a small, portable device. You can wear one while you do your normal daily activities. This is usually used to diagnose what is causing palpitations/syncope (passing out)  Your physician has recommended you make the following change in your medication: DISCONTINUE AMIODARONE  You have been referred to Dr. Ladona Ridgel for BiV pacemaker.  Your physician recommends that you schedule a follow-up appointment in: 1 MONTH

## 2013-01-08 NOTE — Assessment & Plan Note (Signed)
Now roughly 3 months status post bypass surgery on maximum tolerated dose of ARB and beta blocker she continues to have moderately depressed left ventricular systolic function with an ejection fraction of 35-40%. It is unlikely we'll see further improvement in LVEF. She does not have angina pectoris.

## 2013-01-08 NOTE — Assessment & Plan Note (Signed)
No clinically detected recurrence of arrhythmia since the immediate postoperative period. Will stop amiodarone today. Recommend 2 week event monitor. If no atrial fibrillation is detected we'll stop anticoagulation.

## 2013-01-08 NOTE — Assessment & Plan Note (Signed)
Seeing Dr. Elvera Lennox

## 2013-01-08 NOTE — Assessment & Plan Note (Signed)
Will plan to reassess lipid profile once we have her glycemic control in the desirable range. Suspect her hypertriglyceridemia will be much better than.

## 2013-01-08 NOTE — Assessment & Plan Note (Signed)
Despite appropriate medical therapy she continues to require diuretics and has class 2-3 heart failure. Sometimes just washing dishes makes her short of breath. She has a left bundle branch block with a broad QRS at over 180 ms. She will benefit from cardiac resynchronization therapy. Her ejection fraction is right around 35% and the indication for ICD therapy is marginal. She already has a left ventricular epicardial lead that was placed at the time of bypass surgery. Will refer to discuss CRT-P versus CRT-D with Dr. Sharrell Ku.

## 2013-01-09 DIAGNOSIS — Z95810 Presence of automatic (implantable) cardiac defibrillator: Secondary | ICD-10-CM

## 2013-01-09 HISTORY — DX: Presence of automatic (implantable) cardiac defibrillator: Z95.810

## 2013-01-11 ENCOUNTER — Encounter (HOSPITAL_COMMUNITY): Payer: BC Managed Care – PPO

## 2013-01-13 ENCOUNTER — Encounter (HOSPITAL_COMMUNITY): Payer: BC Managed Care – PPO

## 2013-01-15 ENCOUNTER — Encounter: Payer: Self-pay | Admitting: Internal Medicine

## 2013-01-15 ENCOUNTER — Ambulatory Visit (INDEPENDENT_AMBULATORY_CARE_PROVIDER_SITE_OTHER): Payer: BC Managed Care – PPO | Admitting: Internal Medicine

## 2013-01-15 ENCOUNTER — Telehealth: Payer: Self-pay | Admitting: Cardiovascular Disease

## 2013-01-15 ENCOUNTER — Encounter (HOSPITAL_COMMUNITY): Payer: BC Managed Care – PPO

## 2013-01-15 VITALS — BP 104/64 | HR 84 | Temp 97.9°F | Resp 12 | Wt 153.0 lb

## 2013-01-15 DIAGNOSIS — E1065 Type 1 diabetes mellitus with hyperglycemia: Secondary | ICD-10-CM

## 2013-01-15 DIAGNOSIS — IMO0002 Reserved for concepts with insufficient information to code with codable children: Secondary | ICD-10-CM

## 2013-01-15 LAB — HEMOGLOBIN A1C: Hgb A1c MFr Bld: 7.5 % — ABNORMAL HIGH (ref 4.6–6.5)

## 2013-01-15 MED ORDER — GLIPIZIDE 5 MG PO TABS: 5.0000 mg | ORAL_TABLET | Freq: Two times a day (BID) | ORAL | Status: DC

## 2013-01-15 MED ORDER — LOSARTAN POTASSIUM 25 MG PO TABS: 12.5000 mg | ORAL_TABLET | Freq: Two times a day (BID) | ORAL | Status: DC

## 2013-01-15 MED ORDER — METFORMIN HCL 1000 MG PO TABS: 1000.0000 mg | ORAL_TABLET | Freq: Two times a day (BID) | ORAL | Status: DC

## 2013-01-15 NOTE — Addendum Note (Signed)
Addended by: Beecher Mcardle R on: 01/15/2013 05:07 PM   Modules accepted: Orders

## 2013-01-15 NOTE — Telephone Encounter (Signed)
Patient states that the last time she was here that she was told her blood pressure was too low.  She had it taken today and it is 106/70.  She wants to know how low is too low.

## 2013-01-15 NOTE — Patient Instructions (Signed)
Stop Amaryl and start Glipizide 5 mg 2x a day. Continue Metformin. Please come back for a follow-up appointment in 2 months.

## 2013-01-15 NOTE — Telephone Encounter (Signed)
Returned call and pt verified x 2.  Pt stated BP was 104/64 at endocrinologist's office today.  Wanted to know what is too low.  Informed BP < 100/60 is considered low.  RN asked pt if she has had any dizziness or lightheadedness and pt stated she did have some lightheadedness this morning.  Pt stated she doesn't think she is going to take her night dose of losartan.  Pt informed Dr. Salena Saner is out of the office today and PA will be notified for further instructions.  Pt verbalized understanding and agreed w/ plan.  Message forwarded to B. Leron Croak, PA-C for further instructions.

## 2013-01-15 NOTE — Telephone Encounter (Signed)
Returned call and informed pt per instructions by MD/PA.  Pt verbalized understanding and agreed w/ plan.  Pt stated she has 50 mg tabs and wanted to cut in 1/4.  Advised not to do that and Rx will be sent in.  Pt verbalized understanding and agreed w/ plan.  Rx sent to pharmacy.

## 2013-01-15 NOTE — Telephone Encounter (Signed)
Ask patient to cut losartan in half and take tablet twice daily.  If she continues to have dizziness then stop losartan.  Continue to monitor BP.  Ranette Luckadoo 4:05 PM

## 2013-01-15 NOTE — Progress Notes (Signed)
Patient ID: [name], female   DOB: 1950-09-15, 62 y.o.   MRN: [id number]  HPI: [name] is a 62 y.o.-year-old female, returning for f/u for DM2, dx 2002, 62 y.o. insulin-independent, uncontrolled, without complications. Last visit 1 mo ago.  Last hemoglobin A1c was: Lab Results  Component Value Date   HGBA1C 9.1* 09/25/2012   HGBA1C 9.2* 07/18/2012   HGBA1C 9.4* 12/19/2010   Patient has been on insulin before >> hypolycemia. Now on oral meds (see below). Pt is on a regimen of: - Metformin 1000 mg po bid Could not afford Januvia 100 mg daily >> 100$ reduced from 348%. - back on Amaryl 2 mg bid  Pt checks her sugars 2-4x a day and they are: - am: 56-140 >> 107-186, most 120-140 - before lunch: 80-90 >> 70-191 - before dinner: 56-60s but sometimes 102 >> 49-156 - bedtime: 80-110 >> 60-112 Still has lows. Lowest sugar was 49, but 60s, too; she has hypoglycemia awareness at 60. Highest sugar was 191.  Pt started to change her diet after her last A1C returned high >> eats fresh vegetables mostly. No regular exercise, but she has been referred to cardiac rehabilitation.  - no CKD, last BUN/creatinine:  Lab Results  Component Value Date   BUN 12 11/25/2012   CREATININE 0.72 11/25/2012  she is on losartan. - last set of lipids: Lab Results  Component Value Date   CHOL 144 07/18/2012   HDL 39* 07/18/2012   LDLCALC UNABLE TO CALCULATE IF TRIGLYCERIDE OVER 400 mg/dL 06/17/8117   LDLDIRECT 147.8 09/19/2009   TRIG 530* 07/18/2012   CHOLHDL 3.7 07/18/2012  she is on atorvastatin. - last eye exam was 3 years ago (cannot afford to have another one). No DR.  - no numbness and tingling in her feet. Last foot exam 11/04/2012 by Dr. Selena Batten. She is on aspirin 81.  ROS: Constitutional: no weight gain/loss, + fatigue, no subjective hyperthermia/hypothermia; + poor sleep Eyes: no blurry vision - needs new glasses, no xerophthalmia ENT: no sore throat, no nodules palpated in throat, no  dysphagia/odynophagia, no hoarseness Cardiovascular: no CP/+SOB/no palpitations/+ leg swelling Respiratory: no cough/+SOB Gastrointestinal: +N/no V/+D/+C Musculoskeletal: no muscle/joint aches Skin: no rashes Neurological: no tremors/numbness/tingling/dizziness  I reviewed pt's medications, allergies, PMH, social hx, family hx and no changes required, except as mentioned above. She was taken off Amiodarone 1 week ago.  PE: BP 104/64  Pulse 84  Temp(Src) 97.9 F (36.6 C) (Oral)  Resp 12  Wt 153 lb (69.4 kg)  SpO2 95% Wt Readings from Last 3 Encounters:  01/15/13 153 lb (69.4 kg)  01/08/13 150 lb 4.8 oz (68.176 kg)  12/17/12 156 lb (70.761 kg)   Constitutional: normal weight, in NAD Eyes: PERRLA, EOMI, no exophthalmos ENT: moist mucous membranes, no thyromegaly, no cervical lymphadenopathy Cardiovascular: RRR, 2/6 SEM, no RG, + bilateral periankle edema Respiratory: CTA B Gastrointestinal: abdomen soft, NT, ND, BS+ Musculoskeletal: no deformities, strength intact in all 4 Skin: moist, warm, no rashes Neurological: no tremor with outstretched hands, DTR normal in all 4  ASSESSMENT: 1. 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 Patient with long-standing, recently more controlled diabetes, on oral antidiabetic regimen, with frequent low CBGs. I suggested that she tried Januvia at last visit but she cannot afford it. In this case, I suggested she switches to Glipizide to hopefully have less lows. -  We discussed about options for treatment, and I suggested to:  Start Glipizide 5 mg bid Stop Amaryl Continue Metformin 1000 mg bid - continue checking her sugars at different times of the day - check 2 times a day, rotating checks - up to date with eye exams - again refuses flu vaccine - will check HbA1C today. - Return to clinic in 2 mo with  sugar log   Office Visit on 01/15/2013  Component Date Value Range Status  . Hemoglobin A1C 01/15/2013 7.5* 4.6 - 6.5 % Final   Glycemic Control Guidelines for People with Diabetes:Non Diabetic:  <6%Goal of Therapy: <7%Additional Action Suggested:  >8%    Msg sent: Dear [name], [name] HbA1C! Lowest in at least 3 years! Sincerely, Carlus Pavlov MD

## 2013-01-18 ENCOUNTER — Encounter (HOSPITAL_COMMUNITY): Payer: BC Managed Care – PPO

## 2013-01-19 ENCOUNTER — Ambulatory Visit (INDEPENDENT_AMBULATORY_CARE_PROVIDER_SITE_OTHER): Payer: BC Managed Care – PPO | Admitting: Internal Medicine

## 2013-01-19 ENCOUNTER — Encounter: Payer: Self-pay | Admitting: Cardiology

## 2013-01-19 ENCOUNTER — Encounter (HOSPITAL_COMMUNITY): Payer: Self-pay | Admitting: Pharmacy Technician

## 2013-01-19 ENCOUNTER — Encounter: Payer: Self-pay | Admitting: Internal Medicine

## 2013-01-19 VITALS — BP 114/68 | HR 85 | Ht 64.5 in | Wt 151.4 lb

## 2013-01-19 DIAGNOSIS — I5042 Chronic combined systolic (congestive) and diastolic (congestive) heart failure: Secondary | ICD-10-CM

## 2013-01-19 DIAGNOSIS — I255 Ischemic cardiomyopathy: Secondary | ICD-10-CM

## 2013-01-19 DIAGNOSIS — I2589 Other forms of chronic ischemic heart disease: Secondary | ICD-10-CM

## 2013-01-19 DIAGNOSIS — I509 Heart failure, unspecified: Secondary | ICD-10-CM

## 2013-01-19 DIAGNOSIS — Z01812 Encounter for preprocedural laboratory examination: Secondary | ICD-10-CM

## 2013-01-19 LAB — CBC WITH DIFFERENTIAL/PLATELET
Basophils Absolute: 0 10*3/uL (ref 0.0–0.1)
Basophils Relative: 0.4 % (ref 0.0–3.0)
Eosinophils Relative: 1.3 % (ref 0.0–5.0)
HCT: 34.1 % — ABNORMAL LOW (ref 36.0–46.0)
Hemoglobin: 11.6 g/dL — ABNORMAL LOW (ref 12.0–15.0)
Lymphocytes Relative: 28.9 % (ref 12.0–46.0)
Monocytes Relative: 7.6 % (ref 3.0–12.0)
Neutro Abs: 5 10*3/uL (ref 1.4–7.7)
RBC: 3.87 Mil/uL (ref 3.87–5.11)
WBC: 8.1 10*3/uL (ref 4.5–10.5)

## 2013-01-19 LAB — BASIC METABOLIC PANEL
CO2: 29 mEq/L (ref 19–32)
Chloride: 97 mEq/L (ref 96–112)
GFR: 93.53 mL/min (ref >60.00)
Glucose, Bld: 150 mg/dL — ABNORMAL HIGH (ref 70–99)
Potassium: 3.4 mEq/L — ABNORMAL LOW (ref 3.5–5.1)
Sodium: 136 mEq/L (ref 135–145)

## 2013-01-19 NOTE — Progress Notes (Signed)
    HPI [name] is referred today by Dr. Croitoru for ongoing evaluation and treatment of her ischemic CM.  She sustained a myocardial infarction approximately 12 years ago. She initially did well but then developed worsening anginal symptoms and underwent bypass surgery 6 months ago. While she does not have much in the way of angina, she notes increasing shortness of breath, weakness, and fatigue.  Her ejection fraction has been 35-40% by echo, and did not improve after bypass surgery. The patient has had no syncope. She denies peripheral edema. She denies cough or hemoptysis. She is a long history of tobacco abuse but denies any diagnosis of COPD. No Known Allergies   Current Outpatient Prescriptions  Medication Sig Dispense Refill  . aspirin 81 MG tablet Take 1 tablet (81 mg total) by mouth daily.  30 tablet    . atorvastatin (LIPITOR) 80 MG tablet Take 1 tablet (80 mg total) by mouth daily.  90 tablet  3  . Blood Glucose Monitoring Suppl (ONE TOUCH ULTRA SYSTEM KIT) W/DEVICE KIT 1 kit by Does not apply route once.        . carvedilol (COREG) 12.5 MG tablet Take 0.5 tablets (6.25 mg total) by mouth 2 (two) times daily with a meal.  60 tablet  1  . furosemide (LASIX) 40 MG tablet Take 1 tablet (40 mg total) by mouth daily.  30 tablet  1  . glipiZIDE (GLUCOTROL) 5 MG tablet Take 1 tablet (5 mg total) by mouth 2 (two) times daily before a meal.  60 tablet  3  . glucose blood test strip One touch ultra 2 - dx 250.00 twice daily  200 each  12  . losartan (COZAAR) 25 MG tablet Take 0.5 tablets (12.5 mg total) by mouth 2 (two) times daily.  30 tablet  1  . metFORMIN (GLUCOPHAGE) 1000 MG tablet Take 1 tablet (1,000 mg total) by mouth 2 (two) times daily with a meal.  60 tablet  11  . nitroGLYCERIN (NITROSTAT) 0.4 MG SL tablet Place 1 tablet (0.4 mg total) under the tongue every 5 (five) minutes as needed for chest pain.  25 tablet  3  . Rivaroxaban (XARELTO) 20 MG TABS tablet Take 1 tablet (20 mg  total) by mouth daily with supper.  20 tablet  0   No current facility-administered medications for this visit.     Past Medical History  Diagnosis Date  . Diabetes mellitus type II   . Hyperlipidemia   . Hypertension   . Allergic rhinitis   . Cataract   . CAD (coronary artery disease)     a. 2004: s/p MI in [place]. No PCI->Medical RX;  b. 07/2012 Cath: LM 30-40, LAD 70p, 70/80m, D1 80-90p, OM1 small 90p, OM2 large 80-90p, 50m, 70-80d, RCA 20-30 diff, EF 40%, glob HK.s/p CABG  . Ischemic cardiomyopathy     a. 07/2012 Echo: EF 35%, Sev inferoseptal HK, mildly dil LA, Peak PASP 59mmHg.  . PAF (paroxysmal atrial fibrillation)     a. 07/2012: Amio and xarelto initiated.  . LBBB (left bundle branch block)     a. intermittent - present during rapid afib 07/2012.  . Breast cancer 2002    Patient reports left breast cancer diagnosis in 2002 treated with bilateral mastectomy positive lymph nodes with left axillary dissection followed by chemotherapy of unknown type  . Neuromuscular disorder     Patient reports chronic numbness in the right foot related to previous surgery on the right leg   and "nerve damage"    ROS:   All systems reviewed and negative except as noted in the HPI.   Past Surgical History  Procedure Laterality Date  . Mastectomy  2002    bilateral  . Tubal ligation  1987  . Bladder surgery  2000  . Right leg surgery  2001    torn ligaments and tendons x4 surgeries  . Appendectomy  1974  . Cholecystectomy  1974  . Abdominal hysterectomy  2000  . Breast surgery      Bilateral mastectomy for left breast cancer unknown stage but patient reports positive nodes was treated with chemotherapy  . Abdominal hysterectomy    . Coronary artery bypass graft N/A 09/28/2012    Procedure: CORONARY ARTERY BYPASS GRAFTING (CABG);  Surgeon: Edward B Gerhardt, MD;  Location: [facility] OR;  Service: Open Heart Surgery;  Laterality: N/A;  CABG x four, using left internal mammary artery and left leg  greater saphenous vein harvested endoscopically  . Maze N/A 09/28/2012    Procedure: MAZE;  Surgeon: Edward B Gerhardt, MD;  Location: [facility] OR;  Service: Open Heart Surgery;  Laterality: N/A;  . Intraoperative transesophageal echocardiogram N/A 09/28/2012    Procedure: INTRAOPERATIVE TRANSESOPHAGEAL ECHOCARDIOGRAM;  Surgeon: Edward B Gerhardt, MD;  Location: [facility] OR;  Service: Open Heart Surgery;  Laterality: N/A;  . Epicardial pacing lead placement N/A 09/28/2012    Procedure: EPICARDIAL PACING LEAD PLACEMENT;  Surgeon: Edward B Gerhardt, MD;  Location: [facility] OR;  Service: Thoracic;  Laterality: N/A;  LV LEAD PLACEMENT     Family History  Problem Relation Age of Onset  . Kidney failure Mother   . Heart disease Mother     Died mid 60s complications of diabetes  . Arthritis Other   . Cancer Other     colon, ovarian  . Diabetes Other   . Hyperlipidemia Other   . Kidney failure Other   . Colon cancer Sister      History   Social History  . Marital Status: Married    Spouse Name: N/A    Number of Children: 3  . Years of Education: N/A   Occupational History  . Office work    Social History Main Topics  . Smoking status: Former Smoker -- 1.00 packs/day for 10 years    Types: Cigarettes    Quit date: 03/11/2012  . Smokeless tobacco: Never Used     Comment: Quit in January  . Alcohol Use: No  . Drug Use: No  . Sexual Activity: Yes    Partners: Female   Other Topics Concern  . Not on file   Social History Narrative   Lives with husband and mother in law.     Regular exercise: walking   Caffeine use: 2 cups of coffee in the morning     BP 114/68  Pulse 85  Ht 5' 4.5" (1.638 m)  Wt 151 lb 6.4 oz (68.675 kg)  BMI 25.60 kg/m2  Physical Exam:  Well appearing NAD HEENT: Unremarkable Neck:  No JVD, no thyromegally Lymphatics:  No adenopathy Back:  No CVA tenderness Lungs:  Clear HEART:  Regular rate rhythm, no murmurs, no rubs, no clicks Abd:  soft, positive bowel  sounds, no organomegally, no rebound, no guarding Ext:  2 plus pulses, no edema, no cyanosis, no clubbing Skin:  No rashes no nodules Neuro:  CN II through XII intact, motor grossly intact  EKG -  Normal sinus rhythm with left bundle branch block , QRS duraion   150 ms   Assess/Plan: 

## 2013-01-19 NOTE — Assessment & Plan Note (Signed)
She denies anginal symptoms. She will continue her current medical therapy. Her blood pressure is well controlled.

## 2013-01-19 NOTE — Assessment & Plan Note (Signed)
The patient's heart failure symptoms are class III. Despite maximal medical therapy, her ejection fraction is reduced and she has left bundle branch block. I've recommended insertion of a biventricular pacemaker.

## 2013-01-19 NOTE — Patient Instructions (Signed)
.  Your physician recommends that you continue on your current medications as directed. Please refer to the Current Medication list given to you today.  Your physician recommends that you have labs today: BMET, CBC, PTINR  Your physician has recommended that you have a BiV pacemaker inserted. A pacemaker is a small device that is placed under the skin of your chest or abdomen to help control abnormal heart rhythms. This device uses electrical pulses to prompt the heart to beat at a normal rate. Pacemakers are used to treat heart rhythms that are too slow. Wire (leads) are attached to the pacemaker that goes into the chambers of you heart. This is done in the hospital and usually requires and overnight stay. Please see the instruction sheet given to you today for more information.

## 2013-01-20 ENCOUNTER — Encounter (HOSPITAL_COMMUNITY): Payer: BC Managed Care – PPO

## 2013-01-20 ENCOUNTER — Encounter: Payer: Self-pay | Admitting: Cardiovascular Disease

## 2013-01-20 NOTE — Telephone Encounter (Signed)
Message forwarded to Dr. Croitoru.  

## 2013-01-21 ENCOUNTER — Encounter: Payer: Self-pay | Admitting: Cardiothoracic Surgery

## 2013-01-21 ENCOUNTER — Ambulatory Visit (INDEPENDENT_AMBULATORY_CARE_PROVIDER_SITE_OTHER): Payer: BC Managed Care – PPO | Admitting: Cardiothoracic Surgery

## 2013-01-21 ENCOUNTER — Encounter: Payer: Self-pay | Admitting: Internal Medicine

## 2013-01-21 VITALS — BP 110/72 | HR 82 | Resp 20 | Ht 64.5 in | Wt 151.0 lb

## 2013-01-21 DIAGNOSIS — I2510 Atherosclerotic heart disease of native coronary artery without angina pectoris: Secondary | ICD-10-CM

## 2013-01-21 DIAGNOSIS — I4891 Unspecified atrial fibrillation: Secondary | ICD-10-CM

## 2013-01-21 NOTE — Telephone Encounter (Signed)
Call to [facility] and spoke w/ [name].  Informed pt will not put monitor on until later next week.  Stated she will make a note in the account.

## 2013-01-21 NOTE — Progress Notes (Signed)
[address]       [place] [address]             [phone]               [name] [facility] Medical Record #[id number] Date of Birth: 08-May-1950  Dr Royann Shivers  Evette Georges, MD  Chief Complaint:   PostOp Follow Up Visit 09/28/2012  PREOPERATIVE DIAGNOSES: Coronary occlusive disease and paroxysmal  atrial fibrillation.  POSTOPERATIVE DIAGNOSES: Coronary occlusive disease and paroxysmal  atrial fibrillation.  SURGICAL PROCEDURE:  1. Coronary artery bypass grafting x4 with the left internal mammary  to the left anterior descending coronary artery sequential reverse  saphenous vein graft to the first and second obtuse marginal,  reverse saphenous vein graft to the intermediate coronary artery.  2. Left-sided maze procedure, placement of 35-mm left atrial clip and  placement of permanent left ventricular pacing lead.  3. Left leg endo vein harvesting.  SURGEON: Sheliah Plane, MD   History of Present Illness:     Patient returns to the office today for followup visit after  cardiac surgery. She was treated for a superficial sternal wound cellulitis. The sternal incision and left vein harvest site is now completely healed without evidence of infection The patient is returned to part-time work. She still notes fatigue in easily, and sometimes with activity gets mildly short of breath. She continues on Lasix on a daily basis. Preoperatively she was noted to have significant LV dysfunction in the 30% range which may be contributing to her mild heart failure symptoms. A followup echo has been done and she has been referred to EP for placement of a biventricular pacemaker which he notes is planned the done on Monday.   History  Smoking status  . Former Smoker -- 1.00 packs/day for 10 years  . Types: Cigarettes  . Quit date: 03/11/2012  Smokeless tobacco  . Never Used    Comment: Quit in January       No Known Allergies  Current Outpatient  Prescriptions  Medication Sig Dispense Refill  . aspirin 81 MG tablet Take 1 tablet (81 mg total) by mouth daily.  30 tablet    . atorvastatin (LIPITOR) 80 MG tablet Take 1 tablet (80 mg total) by mouth daily.  90 tablet  3  . carvedilol (COREG) 12.5 MG tablet Take 12.5 mg by mouth 2 (two) times daily with a meal.      . furosemide (LASIX) 40 MG tablet Take 1 tablet (40 mg total) by mouth daily.  30 tablet  1  . glipiZIDE (GLUCOTROL) 5 MG tablet Take 1 tablet (5 mg total) by mouth 2 (two) times daily before a meal.  60 tablet  3  . losartan (COZAAR) 25 MG tablet Take 0.5 tablets (12.5 mg total) by mouth 2 (two) times daily.  30 tablet  1  . metFORMIN (GLUCOPHAGE) 1000 MG tablet Take 1 tablet (1,000 mg total) by mouth 2 (two) times daily with a meal.  60 tablet  11  . nitroGLYCERIN (NITROSTAT) 0.4 MG SL tablet Place 1 tablet (0.4 mg total) under the tongue every 5 (five) minutes as needed for chest pain.  25 tablet  3  . Rivaroxaban (XARELTO) 20 MG TABS tablet Take 1 tablet (20 mg total) by mouth daily with supper.  20 tablet  0   No current facility-administered medications for this visit.       Physical Exam: BP 110/72  Pulse 82  Resp 20  Ht 5' 4.5" (1.638 m)  Wt 151 lb (68.493 kg)  BMI 25.53 kg/m2  SpO2 96%  General appearance: alert and cooperative Neurologic: intact Heart: regular rate and rhythm, S1, S2 normal, no murmur, click, rub or gallop Lungs: diminished breath sounds LLL Abdomen: soft, non-tender; bowel sounds normal; no masses,  no organomegaly Extremities: extremities normal, atraumatic, no cyanosis or edema and Homans sign is negative, no sign of DVT Wound:  The wound on the chest  is completely  healed, there is no surrounding erythema The left knee incision is completely healed without erythema.  Diagnostic Studies & Laboratory data:         Recent Radiology Findings: No results found.    Recent Labs: Lab Results  Component Value Date   WBC 8.1 01/19/2013    HGB 11.6* 01/19/2013   HCT 34.1* 01/19/2013   PLT 269.0 01/19/2013   GLUCOSE 150* 01/19/2013   CHOL 144 07/18/2012   TRIG 530* 07/18/2012   HDL 39* 07/18/2012   LDLDIRECT 134.3 09/19/2009   LDLCALC UNABLE TO CALCULATE IF TRIGLYCERIDE OVER 400 mg/dL 06/17/8117   ALT 22 1/47/8295   AST 14 09/25/2012   NA 136 01/19/2013   K 3.4* 01/19/2013   CL 97 01/19/2013   CREATININE 0.7 01/19/2013   BUN 15 01/19/2013   CO2 29 01/19/2013   TSH 2.447 07/18/2012   INR 1.6* 01/19/2013   HGBA1C 7.5* 01/15/2013   Rhythm strip in the office confirms sinus rhythm  Assessment / Plan:   Superficial sternal wound infection  now  completely healed Chest x-ray is clear without significant pleural effusion I do plan to see her back  when necessary.    Lovell Roe B 01/21/2013 2:01 PM

## 2013-01-22 ENCOUNTER — Encounter (HOSPITAL_COMMUNITY): Payer: BC Managed Care – PPO

## 2013-01-24 MED ORDER — CEFAZOLIN SODIUM-DEXTROSE 2-3 GM-% IV SOLR: 2.0000 g | INTRAVENOUS | Status: DC

## 2013-01-25 ENCOUNTER — Encounter (HOSPITAL_COMMUNITY): Payer: BC Managed Care – PPO

## 2013-01-25 ENCOUNTER — Encounter (HOSPITAL_COMMUNITY)
Admission: RE | Disposition: A | Discharge status: Home / Self Care | Payer: Self-pay | Source: Ambulatory Visit | Attending: Internal Medicine

## 2013-01-25 ENCOUNTER — Encounter (HOSPITAL_COMMUNITY): Payer: Self-pay | Admitting: General Practice

## 2013-01-25 ENCOUNTER — Ambulatory Visit (HOSPITAL_COMMUNITY)
Admission: RE | Admit: 2013-01-25 | Discharge: 2013-01-26 | Disposition: A | Discharge status: Home / Self Care | Payer: BC Managed Care – PPO | Source: Ambulatory Visit | Attending: Internal Medicine | Admitting: Internal Medicine

## 2013-01-25 DIAGNOSIS — IMO0002 Reserved for concepts with insufficient information to code with codable children: Secondary | ICD-10-CM

## 2013-01-25 DIAGNOSIS — I480 Paroxysmal atrial fibrillation: Secondary | ICD-10-CM

## 2013-01-25 DIAGNOSIS — I5042 Chronic combined systolic (congestive) and diastolic (congestive) heart failure: Secondary | ICD-10-CM

## 2013-01-25 DIAGNOSIS — I5022 Chronic systolic (congestive) heart failure: Secondary | ICD-10-CM | POA: Insufficient documentation

## 2013-01-25 DIAGNOSIS — E1065 Type 1 diabetes mellitus with hyperglycemia: Secondary | ICD-10-CM

## 2013-01-25 DIAGNOSIS — Z9010 Acquired absence of unspecified breast and nipple: Secondary | ICD-10-CM | POA: Insufficient documentation

## 2013-01-25 DIAGNOSIS — Z7982 Long term (current) use of aspirin: Secondary | ICD-10-CM | POA: Insufficient documentation

## 2013-01-25 DIAGNOSIS — A630 Anogenital (venereal) warts: Secondary | ICD-10-CM

## 2013-01-25 DIAGNOSIS — I447 Left bundle-branch block, unspecified: Secondary | ICD-10-CM

## 2013-01-25 DIAGNOSIS — R0989 Other specified symptoms and signs involving the circulatory and respiratory systems: Secondary | ICD-10-CM

## 2013-01-25 DIAGNOSIS — I509 Heart failure, unspecified: Secondary | ICD-10-CM | POA: Insufficient documentation

## 2013-01-25 DIAGNOSIS — Z7901 Long term (current) use of anticoagulants: Secondary | ICD-10-CM | POA: Insufficient documentation

## 2013-01-25 DIAGNOSIS — I200 Unstable angina: Secondary | ICD-10-CM

## 2013-01-25 DIAGNOSIS — Z853 Personal history of malignant neoplasm of breast: Secondary | ICD-10-CM

## 2013-01-25 DIAGNOSIS — I10 Essential (primary) hypertension: Secondary | ICD-10-CM

## 2013-01-25 DIAGNOSIS — I2589 Other forms of chronic ischemic heart disease: Secondary | ICD-10-CM | POA: Insufficient documentation

## 2013-01-25 DIAGNOSIS — E119 Type 2 diabetes mellitus without complications: Secondary | ICD-10-CM | POA: Insufficient documentation

## 2013-01-25 DIAGNOSIS — I252 Old myocardial infarction: Secondary | ICD-10-CM

## 2013-01-25 DIAGNOSIS — Z951 Presence of aortocoronary bypass graft: Secondary | ICD-10-CM

## 2013-01-25 DIAGNOSIS — I4891 Unspecified atrial fibrillation: Secondary | ICD-10-CM | POA: Insufficient documentation

## 2013-01-25 DIAGNOSIS — E785 Hyperlipidemia, unspecified: Secondary | ICD-10-CM

## 2013-01-25 DIAGNOSIS — Z87891 Personal history of nicotine dependence: Secondary | ICD-10-CM | POA: Insufficient documentation

## 2013-01-25 DIAGNOSIS — I2510 Atherosclerotic heart disease of native coronary artery without angina pectoris: Secondary | ICD-10-CM

## 2013-01-25 DIAGNOSIS — J309 Allergic rhinitis, unspecified: Secondary | ICD-10-CM

## 2013-01-25 DIAGNOSIS — I255 Ischemic cardiomyopathy: Secondary | ICD-10-CM

## 2013-01-25 HISTORY — PX: BI-VENTRICULAR PACEMAKER INSERTION: SHX5462

## 2013-01-25 LAB — BASIC METABOLIC PANEL
BUN: 15 mg/dL (ref 6–23)
CO2: 27 mEq/L (ref 19–32)
Calcium: 9.5 mg/dL (ref 8.4–10.5)
Creatinine, Ser: 0.62 mg/dL (ref 0.50–1.10)
GFR calc Af Amer: >90 mL/min (ref >90)
GFR calc non Af Amer: >90 mL/min (ref >90)
Glucose, Bld: 236 mg/dL — ABNORMAL HIGH (ref 70–99)
Sodium: 138 mEq/L (ref 135–145)

## 2013-01-25 LAB — GLUCOSE, CAPILLARY: Glucose-Capillary: 229 mg/dL — ABNORMAL HIGH (ref 70–99)

## 2013-01-25 LAB — SURGICAL PCR SCREEN: Staphylococcus aureus: POSITIVE — AB

## 2013-01-25 SURGERY — BI-VENTRICULAR PACEMAKER INSERTION (CRT-P)
Anesthesia: LOCAL

## 2013-01-25 MED ORDER — SODIUM CHLORIDE 0.9 % IV SOLN
INTRAVENOUS | Status: DC
  Administered 2013-01-25: 1000 mL via INTRAVENOUS

## 2013-01-25 MED ORDER — CHLORHEXIDINE GLUCONATE CLOTH 2 % EX PADS: 6.0000 | MEDICATED_PAD | Freq: Every day | CUTANEOUS | Status: DC

## 2013-01-25 MED ORDER — MUPIROCIN 2 % EX OINT
TOPICAL_OINTMENT | CUTANEOUS | Status: AC
  Administered 2013-01-25: 1 via NASAL
  Filled 2013-01-25: qty 22

## 2013-01-25 MED ORDER — ATORVASTATIN CALCIUM 80 MG PO TABS
80.0000 mg | ORAL_TABLET | Freq: Every day | ORAL | Status: DC
  Filled 2013-01-25 (×2): qty 1

## 2013-01-25 MED ORDER — LOSARTAN POTASSIUM 25 MG PO TABS
12.5000 mg | ORAL_TABLET | Freq: Two times a day (BID) | ORAL | Status: DC
  Administered 2013-01-25 – 2013-01-26 (×2): 12.5 mg via ORAL
  Filled 2013-01-25 (×3): qty 0.5

## 2013-01-25 MED ORDER — FUROSEMIDE 40 MG PO TABS
40.0000 mg | ORAL_TABLET | Freq: Every day | ORAL | Status: DC
  Administered 2013-01-26: 40 mg via ORAL
  Filled 2013-01-25: qty 1

## 2013-01-25 MED ORDER — ONDANSETRON HCL 4 MG/2ML IJ SOLN: 4.0000 mg | Freq: Four times a day (QID) | INTRAMUSCULAR | Status: DC | PRN

## 2013-01-25 MED ORDER — NITROGLYCERIN 0.4 MG SL SUBL
0.4000 mg | SUBLINGUAL_TABLET | SUBLINGUAL | Status: DC | PRN
Start: 2013-01-25 — End: 2013-01-26

## 2013-01-25 MED ORDER — CHLORHEXIDINE GLUCONATE 4 % EX LIQD
60.0000 mL | Freq: Once | CUTANEOUS | Status: DC
  Filled 2013-01-25: qty 60

## 2013-01-25 MED ORDER — SODIUM CHLORIDE 0.9 % IR SOLN
80.0000 mg | Status: DC
  Filled 2013-01-25: qty 2

## 2013-01-25 MED ORDER — RIVAROXABAN 20 MG PO TABS
20.0000 mg | ORAL_TABLET | Freq: Every day | ORAL | Status: DC
  Administered 2013-01-25: 20 mg via ORAL
  Filled 2013-01-25 (×2): qty 1

## 2013-01-25 MED ORDER — MUPIROCIN 2 % EX OINT
TOPICAL_OINTMENT | Freq: Two times a day (BID) | CUTANEOUS | Status: DC
  Administered 2013-01-25: 1 via NASAL
  Filled 2013-01-25: qty 22

## 2013-01-25 MED ORDER — MIDAZOLAM HCL 5 MG/5ML IJ SOLN
INTRAMUSCULAR | Status: AC
  Filled 2013-01-25: qty 5

## 2013-01-25 MED ORDER — CARVEDILOL 12.5 MG PO TABS
12.5000 mg | ORAL_TABLET | Freq: Two times a day (BID) | ORAL | Status: DC
  Administered 2013-01-25 – 2013-01-26 (×2): 12.5 mg via ORAL
  Filled 2013-01-25 (×3): qty 1

## 2013-01-25 MED ORDER — MUPIROCIN 2 % EX OINT
1.0000 "application " | TOPICAL_OINTMENT | Freq: Two times a day (BID) | CUTANEOUS | Status: DC
  Administered 2013-01-25 – 2013-01-26 (×2): 1 via NASAL
  Filled 2013-01-25: qty 22

## 2013-01-25 MED ORDER — ACETAMINOPHEN 325 MG PO TABS
325.0000 mg | ORAL_TABLET | ORAL | Status: DC | PRN
  Administered 2013-01-25: 650 mg via ORAL
  Filled 2013-01-25: qty 2

## 2013-01-25 MED ORDER — CEFAZOLIN SODIUM 1-5 GM-% IV SOLN
1.0000 g | Freq: Four times a day (QID) | INTRAVENOUS | Status: AC
  Administered 2013-01-25 – 2013-01-26 (×3): 1 g via INTRAVENOUS
  Filled 2013-01-25 (×4): qty 50

## 2013-01-25 MED ORDER — FENTANYL CITRATE 0.05 MG/ML IJ SOLN
INTRAMUSCULAR | Status: AC
  Filled 2013-01-25: qty 2

## 2013-01-25 MED ORDER — LIDOCAINE HCL (PF) 1 % IJ SOLN
INTRAMUSCULAR | Status: AC
  Filled 2013-01-25: qty 60

## 2013-01-25 MED ORDER — GLIPIZIDE 5 MG PO TABS
5.0000 mg | ORAL_TABLET | Freq: Two times a day (BID) | ORAL | Status: DC
  Administered 2013-01-25 – 2013-01-26 (×2): 5 mg via ORAL
  Filled 2013-01-25 (×4): qty 1

## 2013-01-25 NOTE — Discharge Summary (Signed)
ELECTROPHYSIOLOGY PROCEDURE DISCHARGE SUMMARY    Patient ID: [name],  MRN: [id number], DOB/AGE: 10-28-1950 62 y.o.  Admit date: 01/25/2013 Discharge date: 01/26/2013  Primary Care Physician: Kelle Darting, MD Primary Cardiologist: Thurmon Fair, MD Electrophysiologist: Lewayne Bunting, MD  Primary Discharge Diagnosis:  Ischemic cardiomyopathy, congestive heart failure status post CRTP implantation this admission  Secondary Discharge Diagnosis:  1. Coronary artery disease - s/p CABG 09/2012 2.  Diabetes 3.  Hyperlipidemia 4.  Hypertension 5.  Allergic rhinitis 6.  Paroxysmal atrial fibrillation 7.  LBBB 8.  Breast cancer - s/p bilateral mastectomies with left axillary dissection followed by chemotherapy.   No Known Allergies   Procedures This Admission:  1.  Implantation of a cardiac resynchronization therapy pacemaker on 01-25-2013 by Dr Ladona Ridgel.  Please see his op note for details.  There were no early apparent complications. 2.  CXR on 01-26-2013 demonstrated no pneumothorax and stable leads.  Brief HPI: [name] is a 62 year old female with a past medical history as outlined above.  She has had persistent LV dysfunction and heart failure despite revascularization and guideline directed therapy.  She was referred to EP in the outpatient setting for consideration of CRT-P implantation.  Risks, benefits, and alternatives were reviewed with the patient who wished to proceed.  Hospital Course:  The patient was admitted and underwent implantation of a cardiac resynchronization therapy pacemaker with details as outlined in Dr Lubertha Basque op note.  Her previously implanted LV epicardial leads were used.   She was monitored on telemetry overnight which demonstrated sinus rhythm with ventricular pacing.  Left chest was without hematoma or ecchymosis.  The device was interrogated and found to be functioning normally.  CXR was obtained and demonstrated no pneumothorax status post  device implantation.  Wound care, arm mobility, and restrictions were reviewed with the patient.  Dr Johney Frame examined the patient and considered them stable for discharge to home.   Physical Exam: Filed Vitals:   01/25/13 1611 01/25/13 2028 01/26/13 0039 01/26/13 0611  BP: 123/63 142/76 127/55 164/72  Pulse: 86 87 77 80  Temp: 98.4 F (36.9 C) 98 F (36.7 C) 97.8 F (36.6 C) 97.7 F (36.5 C)  TempSrc: Oral Oral Oral Oral  Resp: 18 18 20 20   Height:      Weight:   151 lb 0.2 oz (68.5 kg)   SpO2: 97% 100% 96% 100%    GEN- The patient is well appearing, alert and oriented x 3 today.   Head- normocephalic, atraumatic Eyes-  Sclera clear, conjunctiva pink Ears- hearing intact Oropharynx- clear Neck- supple, no JVP Lymph- no cervical lymphadenopathy Lungs- Clear to ausculation bilaterally, normal work of breathing Chest- ICD pocket is without hematoma Heart- Regular rate and rhythm  GI- soft, NT, ND, + BS Extremities- no clubbing, cyanosis, or edema  ICD interrogation- reviewed in detail today,  See paper chart  Labs:   Lab Results  Component Value Date   WBC 8.1 01/19/2013   HGB 11.6* 01/19/2013   HCT 34.1* 01/19/2013   MCV 88.1 01/19/2013   PLT 269.0 01/19/2013     Recent Labs Lab 01/25/13 0934  NA 138  K 3.4*  CL 99  CO2 27  BUN 15  CREATININE 0.62  CALCIUM 9.5  GLUCOSE 236*    Discharge Medications:    Medication List    ASK your doctor about these medications       aspirin 81 MG tablet  Take 1 tablet (81 mg total)  by mouth daily.     atorvastatin 80 MG tablet  Commonly known as:  LIPITOR  Take 1 tablet (80 mg total) by mouth daily.     carvedilol 12.5 MG tablet  Commonly known as:  COREG  Take 12.5 mg by mouth 2 (two) times daily with a meal.     furosemide 40 MG tablet  Commonly known as:  LASIX  Take 1 tablet (40 mg total) by mouth daily.     glipiZIDE 5 MG tablet  Commonly known as:  GLUCOTROL  Take 1 tablet (5 mg total) by mouth 2  (two) times daily before a meal.     losartan 25 MG tablet  Commonly known as:  COZAAR  Take 0.5 tablets (12.5 mg total) by mouth 2 (two) times daily.     metFORMIN 1000 MG tablet  Commonly known as:  GLUCOPHAGE  Take 1 tablet (1,000 mg total) by mouth 2 (two) times daily with a meal.     nitroGLYCERIN 0.4 MG SL tablet  Commonly known as:  NITROSTAT  Place 1 tablet (0.4 mg total) under the tongue every 5 (five) minutes as needed for chest pain.     Rivaroxaban 20 MG Tabs tablet  Commonly known as:  XARELTO  Take 1 tablet (20 mg total) by mouth daily with supper.        Disposition:   Future Appointments Provider Department Dept Phone   02/11/2013 2:30 PM Roderick Pee, MD [facility] [phone]   03/02/2013 3:30 PM Thurmon Fair, MD [facility] [phone]   03/19/2013 3:15 PM Carlus Pavlov, MD [facility] [phone]     Follow-up Information   Follow up with [facility].   Specialty:  Cardiology   Contact information:   [address] [place] [address] [phone]      Follow up with Lewayne Bunting, MD.   Specialty:  Cardiology   Contact information:   [address] [place] [address] [phone]       Duration of Discharge Encounter: Greater than 30 minutes including physician time.  Signed,  Hillis Range MD

## 2013-01-25 NOTE — CV Procedure (Signed)
BiV PPM insertion via the left subclavian vein without immediate complication. A#[id number].

## 2013-01-25 NOTE — Progress Notes (Signed)
Dr Ladona Ridgel, currently in OR, spoke to Theodore Demark, PA regarding patient having Bilateral mastectomies and IV placement. Epic says bilateral mastectomies with lymph nodes with left axillary dissection. Bjorn Loser said put Saline lock in left arm 2nd to axillary dissection and running IV in Right arm

## 2013-01-25 NOTE — H&P (View-Only) (Signed)
HPI [name] is referred today by Dr. Royann Shivers for ongoing evaluation and treatment of her ischemic CM.  She sustained a myocardial infarction approximately 12 years ago. She initially did well but then developed worsening anginal symptoms and underwent bypass surgery 6 months ago. While she does not have much in the way of angina, she notes increasing shortness of breath, weakness, and fatigue.  Her ejection fraction has been 35-40% by echo, and did not improve after bypass surgery. The patient has had no syncope. She denies peripheral edema. She denies cough or hemoptysis. She is a long history of tobacco abuse but denies any diagnosis of COPD. No Known Allergies   Current Outpatient Prescriptions  Medication Sig Dispense Refill  . aspirin 81 MG tablet Take 1 tablet (81 mg total) by mouth daily.  30 tablet    . atorvastatin (LIPITOR) 80 MG tablet Take 1 tablet (80 mg total) by mouth daily.  90 tablet  3  . Blood Glucose Monitoring Suppl (ONE TOUCH ULTRA SYSTEM KIT) W/DEVICE KIT 1 kit by Does not apply route once.        . carvedilol (COREG) 12.5 MG tablet Take 0.5 tablets (6.25 mg total) by mouth 2 (two) times daily with a meal.  60 tablet  1  . furosemide (LASIX) 40 MG tablet Take 1 tablet (40 mg total) by mouth daily.  30 tablet  1  . glipiZIDE (GLUCOTROL) 5 MG tablet Take 1 tablet (5 mg total) by mouth 2 (two) times daily before a meal.  60 tablet  3  . glucose blood test strip One touch ultra 2 - dx 250.00 twice daily  200 each  12  . losartan (COZAAR) 25 MG tablet Take 0.5 tablets (12.5 mg total) by mouth 2 (two) times daily.  30 tablet  1  . metFORMIN (GLUCOPHAGE) 1000 MG tablet Take 1 tablet (1,000 mg total) by mouth 2 (two) times daily with a meal.  60 tablet  11  . nitroGLYCERIN (NITROSTAT) 0.4 MG SL tablet Place 1 tablet (0.4 mg total) under the tongue every 5 (five) minutes as needed for chest pain.  25 tablet  3  . Rivaroxaban (XARELTO) 20 MG TABS tablet Take 1 tablet (20 mg  total) by mouth daily with supper.  20 tablet  0   No current facility-administered medications for this visit.     Past Medical History  Diagnosis Date  . Diabetes mellitus type II   . Hyperlipidemia   . Hypertension   . Allergic rhinitis   . Cataract   . CAD (coronary artery disease)     a. 2004: s/p MI in [place]. No PCI->Medical RX;  b. 07/2012 Cath: LM 30-40, LAD 70p, 70/22m, D1 80-90p, OM1 small 90p, OM2 large 80-90p, 75m, 70-80d, RCA 20-30 diff, EF 40%, glob HK.s/p CABG  . Ischemic cardiomyopathy     a. 07/2012 Echo: EF 35%, Sev inferoseptal HK, mildly dil LA, Peak PASP .  Marland Kitchen PAF (paroxysmal atrial fibrillation)     a. 07/2012: Amio and xarelto initiated.  Marland Kitchen LBBB (left bundle branch block)     a. intermittent - present during rapid afib 07/2012.  . Breast cancer 2002    Patient reports left breast cancer diagnosis in 2002 treated with bilateral mastectomy positive lymph nodes with left axillary dissection followed by chemotherapy of unknown type  . Neuromuscular disorder     Patient reports chronic numbness in the right foot related to previous surgery on the right leg  and "nerve damage"    ROS:   All systems reviewed and negative except as noted in the HPI.   Past Surgical History  Procedure Laterality Date  . Mastectomy  2002    bilateral  . Tubal ligation  1987  . Bladder surgery  2000  . Right leg surgery  2001    torn ligaments and tendons x4 surgeries  . Appendectomy  1974  . Cholecystectomy  1974  . Abdominal hysterectomy  2000  . Breast surgery      Bilateral mastectomy for left breast cancer unknown stage but patient reports positive nodes was treated with chemotherapy  . Abdominal hysterectomy    . Coronary artery bypass graft N/A 09/28/2012    Procedure: CORONARY ARTERY BYPASS GRAFTING (CABG);  Surgeon: Delight Ovens, MD;  Location: [facility] OR;  Service: Open Heart Surgery;  Laterality: N/A;  CABG x four, using left internal mammary artery and left leg  greater saphenous vein harvested endoscopically  . Maze N/A 09/28/2012    Procedure: MAZE;  Surgeon: Delight Ovens, MD;  Location: [facility] OR;  Service: Open Heart Surgery;  Laterality: N/A;  . Intraoperative transesophageal echocardiogram N/A 09/28/2012    Procedure: INTRAOPERATIVE TRANSESOPHAGEAL ECHOCARDIOGRAM;  Surgeon: Delight Ovens, MD;  Location[facility] OR;  Service: Open Heart Surgery;  Laterality: N/A;  . Epicardial pacing lead placement N/A 09/28/2012    Procedure: EPICARDIAL PACING LEAD PLACEMENT;  Surgeon: Delight Ovens, MD;  Location: [facility] OR;  Service: Thoracic;  Laterality: N/A;  LV LEAD PLACEMENT     Family History  Problem Relation Age of Onset  . Kidney failure Mother   . Heart disease Mother     Died mid 56s complications of diabetes  . Arthritis Other   . Cancer Other     colon, ovarian  . Diabetes Other   . Hyperlipidemia Other   . Kidney failure Other   . Colon cancer Sister      History   Social History  . Marital Status: Married    Spouse Name: N/A    Number of Children: 3  . Years of Education: N/A   Occupational History  . Office work    Social History Main Topics  . Smoking status: Former Smoker -- 1.00 packs/day for 10 years    Types: Cigarettes    Quit date: 03/11/2012  . Smokeless tobacco: Never Used     Comment: Quit in January  . Alcohol Use: No  . Drug Use: No  . Sexual Activity: Yes    Partners: Female   Other Topics Concern  . Not on file   Social History Narrative   Lives with husband and mother in law.     Regular exercise: walking   Caffeine use: 2 cups of coffee in the morning     BP 114/68  Pulse 85  Ht 5' 4.5" (1.638 m)  Wt 151 lb 6.4 oz (68.675 kg)  BMI 25.60 kg/m2  Physical Exam:  Well appearing NAD HEENT: Unremarkable Neck:  No JVD, no thyromegally Lymphatics:  No adenopathy Back:  No CVA tenderness Lungs:  Clear HEART:  Regular rate rhythm, no murmurs, no rubs, no clicks Abd:  soft, positive bowel  sounds, no organomegally, no rebound, no guarding Ext:  2 plus pulses, no edema, no cyanosis, no clubbing Skin:  No rashes no nodules Neuro:  CN II through XII intact, motor grossly intact  EKG -  Normal sinus rhythm with left bundle branch block , QRS duraion  150 ms   Assess/Plan:

## 2013-01-25 NOTE — Progress Notes (Signed)
Cath lab informed that BMP pending, pt to be taken to cath lab per [facility].

## 2013-01-25 NOTE — Interval H&P Note (Signed)
History and Physical Interval Note:  01/25/2013 10:07 AM  [name]  has presented today for surgery, with the diagnosis of Heart block  The various methods of treatment have been discussed with the patient and family. After consideration of risks, benefits and other options for treatment, the patient has consented to  Procedure(s): BI-VENTRICULAR PACEMAKER INSERTION (CRT-P) (N/A) as a surgical intervention .  The patient's history has been reviewed, patient examined, no change in status, stable for surgery.  I have reviewed the patient's chart and labs.  Questions were answered to the patient's satisfaction.     Lewayne Bunting , M.D.

## 2013-01-26 ENCOUNTER — Ambulatory Visit (HOSPITAL_COMMUNITY): Payer: BC Managed Care – PPO

## 2013-01-26 DIAGNOSIS — I2589 Other forms of chronic ischemic heart disease: Secondary | ICD-10-CM

## 2013-01-26 LAB — GLUCOSE, CAPILLARY: Glucose-Capillary: 243 mg/dL — ABNORMAL HIGH (ref 70–99)

## 2013-01-26 MED ORDER — LIVING WELL WITH DIABETES BOOK
Freq: Once | Status: AC
  Administered 2013-01-26: 02:00:00
  Filled 2013-01-26: qty 1

## 2013-01-26 MED ORDER — YOU HAVE A PACEMAKER BOOK
Freq: Once | Status: AC
  Administered 2013-01-26: 02:00:00
  Filled 2013-01-26: qty 1

## 2013-01-26 NOTE — Op Note (Signed)
[name], [name] NO.:  [id number]  MEDICAL RECORD NO.:  [id number]  LOCATION:  6C06C                        FACILITY:  [facility]  PHYSICIAN:  Doylene Canning. Ladona Ridgel, MD    DATE OF BIRTH:  08/11/1950  DATE OF PROCEDURE:  01/25/2013 DATE OF DISCHARGE:                              OPERATIVE REPORT   PROCEDURE PERFORMED:  Insertion of a biventricular pacemaker.  INDICATION:  Symptomatic chronic class III systolic heart failure with ejection fraction of 35% by echo and left bundle-branch block, QRS duration 170 milliseconds.  INTRODUCTION:  The patient is a 62 year old woman with an ischemic cardiomyopathy status post bypass surgery, and persistent left bundle- branch block status post remote MI.  Her ejection fraction was 35% by echo 6 months after bypass surgery.  The patient has class III heart failure symptoms and left bundle-branch block despite maximal medical therapy and is now referred for implantation of her device.  PROCEDURE:  After informed consent was obtained, the patient was taken to the diagnostic EP lab in a fasting state.  After usual preparation and draping, intravenous fentanyl and midazolam were given for sedation. A 30 mL of lidocaine was infiltrated into the left infraclavicular region.  A 5-cm incision was carried out over this region, electrocautery was utilized to dissect down to the fascial plane.  The left subclavian vein was punctured x3 and the St. Jude model 2088T 52 cm active fixation pacing lead, serial #[id number] was advanced to the right ventricle.  The St. Jude model 2088T 46 cm active fixation pacing lead serial #[id number] was advanced to the right atrium.  Mapping was first carried out in the right ventricle.  At the final site, the R-wave was measured 11 mV and with lead actively fixed the pacing impedance was 0.4 V at 0.4 milliseconds and 10 V pacing did not stimulate the diaphragm. The impedance was 600 ohms.  A 10 V pacing did  not stimulate the diaphragm.  There was a satisfactory injury current.  With the ventricular lead in satisfactory position, attention was then turned to the atrial lead which was placed in the anterolateral portion of the right atrium.  Initial P-waves were low, but additional mapping was carried out and at the final site the P-waves were 0.5 mV, the pacing impedance with the lead actively fixed was 350 ohms, and a threshold 0.5 V at 0.4 milliseconds.  A 10 V pacing did not stimulate the diaphragm. At this point, the coronary sinus guiding catheter along with a 6-French hexapolar EP catheter was utilized to cannulate the coronary sinus with a modest amount of difficulty.  Venography of the coronary sinus was carried out.  This demonstrated a bifurcating coronary sinus. Interestingly enough, the lateral vein of the left ventricle came off with a very unusual takeoff.  Multiple attempts to advance a guidewire in this vein were unsuccessful.  A subselective catheter was also utilized, but the lateral vein of the left ventricle could not be cannulated.  At this point, the patient had a previously implanted epicardial unipolar LV lead and we decided to utilize this lead for LV pacing.  The guiding catheter was removed.  The RV and RA leads  were secured to the subpectoral fascia with a figure-of-eight silk suture, and the sewing sleeve was secured with silk suture.  Unfortunately, we could not initially locate the epicardial LV lead.  It was not subcutaneous.  After multiple attempts utilizing a combination of fluoroscopy and blunt dissection, the left ventricular lead was found underneath the pectoralis major.  It was dissected out and free and pulled up into the subcutaneous pocket.  The pocket was irrigated with antibiotic irrigation.  The St. Jude.  RF biventricular pacemaker serial [id number] was connected to the atrial and RV lead.  The LV lead was a Medtronic lead, serial [id number].  At  this point, the LV lead was hooked up to the biventricular pacemaker as well, and all placed back in the subcutaneous pocket.  The pacing threshold in the left ventricle was 2.25 V at 0.4 milliseconds and the pacing impedance was 340 ohms. Additional irrigation was utilized to irrigate the pocket.  The incision was closed with 2-0 and 3-0 Vicryl.  Benzoin and Steri-Strips were painted on the skin.  A pressure dressing was applied, and the patient was returned to her room in satisfactory condition.  COMPLICATIONS:  There were no immediate procedure complications.  RESULTS:  Demonstrate successful implantation of a St. Jude biventricular pacemaker in a patient with all the above problems as previously noted.     Doylene Canning. Ladona Ridgel, MD     GWT/MEDQ  D:  01/25/2013  T:  01/26/2013  Job:  [id number]  cc:   Thurmon Fair, MD

## 2013-01-27 ENCOUNTER — Encounter (HOSPITAL_COMMUNITY): Payer: BC Managed Care – PPO

## 2013-01-28 ENCOUNTER — Encounter: Payer: Self-pay | Admitting: Cardiovascular Disease

## 2013-01-29 ENCOUNTER — Encounter (HOSPITAL_COMMUNITY): Payer: Self-pay | Admitting: *Deleted

## 2013-01-29 ENCOUNTER — Encounter (HOSPITAL_COMMUNITY): Payer: BC Managed Care – PPO

## 2013-01-30 ENCOUNTER — Encounter: Payer: Self-pay | Admitting: Cardiovascular Disease

## 2013-02-01 ENCOUNTER — Encounter (HOSPITAL_COMMUNITY): Payer: BC Managed Care – PPO

## 2013-02-01 ENCOUNTER — Encounter: Payer: Self-pay | Admitting: Family Medicine

## 2013-02-01 MED ORDER — RIVAROXABAN 20 MG PO TABS: 20.0000 mg | ORAL_TABLET | Freq: Every day | ORAL | Status: DC

## 2013-02-02 ENCOUNTER — Telehealth: Payer: Self-pay | Admitting: *Deleted

## 2013-02-02 NOTE — Telephone Encounter (Signed)
Patient called requesting we help her put the Event Monitor on the first time.  Called and offered her to come to the office but she is going to her surgeons tomorrow and will ask them for assistance.  I don't know if they will be able to help but if not she will call us and come by the office.  Voiced understanding.

## 2013-02-03 ENCOUNTER — Ambulatory Visit (INDEPENDENT_AMBULATORY_CARE_PROVIDER_SITE_OTHER): Payer: BC Managed Care – PPO | Admitting: *Deleted

## 2013-02-03 ENCOUNTER — Encounter (HOSPITAL_COMMUNITY): Payer: BC Managed Care – PPO

## 2013-02-03 ENCOUNTER — Other Ambulatory Visit: Payer: Self-pay | Admitting: *Deleted

## 2013-02-03 DIAGNOSIS — I255 Ischemic cardiomyopathy: Secondary | ICD-10-CM

## 2013-02-03 DIAGNOSIS — I5042 Chronic combined systolic (congestive) and diastolic (congestive) heart failure: Secondary | ICD-10-CM

## 2013-02-03 DIAGNOSIS — I2589 Other forms of chronic ischemic heart disease: Secondary | ICD-10-CM

## 2013-02-03 DIAGNOSIS — I509 Heart failure, unspecified: Secondary | ICD-10-CM

## 2013-02-03 MED ORDER — CEPHALEXIN 500 MG PO CAPS: 500.0000 mg | ORAL_CAPSULE | Freq: Two times a day (BID) | ORAL | Status: DC

## 2013-02-03 NOTE — Progress Notes (Signed)
Pt seen in device clinic for follow up of recently implanted CRT-P.  Steri-strips removed today. Incision has partial slight separating---pt put on Keflex x7 days per JA.  Device interrogated and found to be functioning normally.  No changes made today. See PaceArt for full details.  Pt to follow up with device clinic 02/10/13 for wound re-check only.    [name] 02/03/2013 4:00 PM

## 2013-02-05 ENCOUNTER — Encounter (HOSPITAL_COMMUNITY): Payer: BC Managed Care – PPO

## 2013-02-05 ENCOUNTER — Encounter: Payer: Self-pay | Admitting: Internal Medicine

## 2013-02-05 LAB — MDC_IDC_ENUM_SESS_TYPE_INCLINIC
Battery Remaining Longevity: 42 mo
Battery Voltage: 2.99 V
Implantable Pulse Generator Model: 3222
Lead Channel Impedance Value: 340 Ohm
Lead Channel Impedance Value: 410 Ohm
Lead Channel Pacing Threshold Amplitude: 1.75 V
Lead Channel Pacing Threshold Pulse Width: 0.4 ms
Lead Channel Pacing Threshold Pulse Width: 0.5 ms
Lead Channel Sensing Intrinsic Amplitude: 2 mV
Lead Channel Setting Pacing Amplitude: 3.5 V
Lead Channel Setting Pacing Pulse Width: 0.4 ms
Lead Channel Setting Pacing Pulse Width: 0.5 ms

## 2013-02-08 ENCOUNTER — Encounter (HOSPITAL_COMMUNITY): Payer: BC Managed Care – PPO

## 2013-02-08 ENCOUNTER — Ambulatory Visit: Payer: BC Managed Care – PPO | Admitting: Family Medicine

## 2013-02-10 ENCOUNTER — Other Ambulatory Visit: Payer: Self-pay | Admitting: *Deleted

## 2013-02-10 ENCOUNTER — Encounter: Payer: Self-pay | Admitting: Internal Medicine

## 2013-02-10 ENCOUNTER — Ambulatory Visit (INDEPENDENT_AMBULATORY_CARE_PROVIDER_SITE_OTHER): Payer: BC Managed Care – PPO | Admitting: *Deleted

## 2013-02-10 ENCOUNTER — Encounter (HOSPITAL_COMMUNITY): Payer: BC Managed Care – PPO

## 2013-02-10 ENCOUNTER — Encounter: Payer: Self-pay | Admitting: Cardiovascular Disease

## 2013-02-10 DIAGNOSIS — I255 Ischemic cardiomyopathy: Secondary | ICD-10-CM

## 2013-02-10 DIAGNOSIS — I2589 Other forms of chronic ischemic heart disease: Secondary | ICD-10-CM

## 2013-02-10 DIAGNOSIS — I509 Heart failure, unspecified: Secondary | ICD-10-CM

## 2013-02-10 DIAGNOSIS — I5042 Chronic combined systolic (congestive) and diastolic (congestive) heart failure: Secondary | ICD-10-CM

## 2013-02-10 NOTE — Progress Notes (Signed)
Wound check follow up from 02/05/13 where the wound was not completely closed and the patient was started on Keflex x 7 days.  Incision well healed today.  We will follow up as scheduled.

## 2013-02-11 ENCOUNTER — Ambulatory Visit (INDEPENDENT_AMBULATORY_CARE_PROVIDER_SITE_OTHER): Payer: BC Managed Care – PPO | Admitting: Family Medicine

## 2013-02-11 ENCOUNTER — Other Ambulatory Visit: Payer: Self-pay

## 2013-02-11 ENCOUNTER — Encounter: Payer: Self-pay | Admitting: Family Medicine

## 2013-02-11 VITALS — BP 120/66 | Temp 97.8°F | Wt 150.0 lb

## 2013-02-11 DIAGNOSIS — E119 Type 2 diabetes mellitus without complications: Secondary | ICD-10-CM

## 2013-02-11 DIAGNOSIS — I10 Essential (primary) hypertension: Secondary | ICD-10-CM

## 2013-02-11 MED ORDER — GLUCOSE BLOOD VI STRP: ORAL_STRIP | Status: DC

## 2013-02-11 NOTE — Progress Notes (Signed)
   Subjective:    Patient ID: [name], female    DOB: 22-Sep-1950, 62 y.o.   MRN: [id number]  HPI [name] is a 34-year-old married female nonsmoker,,,,,,,,, x1 year,,,,,,,,, who comes in today for followup her for metabolic syndrome  Her weight is steady 150 pounds  Her A1c was markedly elevated but 3 weeks ago was 7.5. She's been very compliant with her diet and exercise. She had bypass surgery this fall and has done well. She also has a permanent pacemaker in.  She takes Lipitor 80 mg daily for lipid control and 1000 mg metformin twice a day and glipizide 5 mg twice a day for blood sugar control. Fasting blood sugar in the 120 range. Since she's had bypass surgery she is walking on a regular basis. No complaints of chest pain or shortness of breath   Review of Systems    review of systems negative except she has a bulge in the right lower quadrant she would like checked Objective:   Physical Exam  Well-developed well-nourished female no acute distress vital signs stable she's afebrile BP 120/66 pulse 70 and regular abdominal exam shows a large hernia in the incision that she had done 10 years ago when she had breast cancer in a TRAM flap      Assessment & Plan:  Diabetes type 2 at goal continue current therapy  Hyperlipidemia goal continue current therapy  Hypertension at goal continue current therapy  Coronary disease status post bypass surgery  Ventral hernia refer to [name] for consult

## 2013-02-11 NOTE — Patient Instructions (Signed)
Continue your current medications  Continue your good diet and daily walking  Return in 3 months for followup on your diabetes  In the meantime call Dr. Emelia Loron at [phone] for consult concerning the incisional hernia

## 2013-02-12 ENCOUNTER — Encounter (HOSPITAL_COMMUNITY): Payer: BC Managed Care – PPO

## 2013-02-12 ENCOUNTER — Encounter: Payer: Self-pay | Admitting: Cardiovascular Disease

## 2013-02-15 ENCOUNTER — Encounter (HOSPITAL_COMMUNITY): Payer: BC Managed Care – PPO

## 2013-02-17 ENCOUNTER — Encounter: Payer: Self-pay | Admitting: Cardiovascular Disease

## 2013-02-17 ENCOUNTER — Telehealth: Payer: Self-pay | Admitting: Cardiovascular Disease

## 2013-02-17 ENCOUNTER — Encounter (HOSPITAL_COMMUNITY): Payer: BC Managed Care – PPO

## 2013-02-17 NOTE — Telephone Encounter (Signed)
She have just received her third  Monitor.She wants to know if she can come come in to see if it is working.

## 2013-02-17 NOTE — Telephone Encounter (Signed)
Returned call.  Pt stated she just spoke w/ Britta Mccreedy and everything is fine.

## 2013-02-17 NOTE — Telephone Encounter (Signed)
Received her 3rd Event Monitor today.  Wants to make sure she is placing the electrodes in the proper place.  Told her she does not have to come in the can look in the book for instructions and press button to do a recording and then call [employer] a couple hours later to make sure they are getting her recordings.  She will do this and let us know if there is anything we can do.  Voiced understanding.

## 2013-02-19 ENCOUNTER — Encounter (HOSPITAL_COMMUNITY): Payer: BC Managed Care – PPO

## 2013-02-19 ENCOUNTER — Ambulatory Visit (INDEPENDENT_AMBULATORY_CARE_PROVIDER_SITE_OTHER): Payer: BC Managed Care – PPO | Admitting: Cardiovascular Disease

## 2013-02-19 VITALS — BP 122/70 | HR 80 | Ht 64.0 in | Wt 153.0 lb

## 2013-02-19 DIAGNOSIS — Z950 Presence of cardiac pacemaker: Secondary | ICD-10-CM

## 2013-02-19 DIAGNOSIS — Z951 Presence of aortocoronary bypass graft: Secondary | ICD-10-CM

## 2013-02-19 DIAGNOSIS — I5042 Chronic combined systolic (congestive) and diastolic (congestive) heart failure: Secondary | ICD-10-CM

## 2013-02-19 DIAGNOSIS — I509 Heart failure, unspecified: Secondary | ICD-10-CM

## 2013-02-19 DIAGNOSIS — I255 Ischemic cardiomyopathy: Secondary | ICD-10-CM

## 2013-02-19 DIAGNOSIS — I4891 Unspecified atrial fibrillation: Secondary | ICD-10-CM

## 2013-02-19 DIAGNOSIS — I480 Paroxysmal atrial fibrillation: Secondary | ICD-10-CM

## 2013-02-19 DIAGNOSIS — I2589 Other forms of chronic ischemic heart disease: Secondary | ICD-10-CM

## 2013-02-19 LAB — PACEMAKER DEVICE OBSERVATION

## 2013-02-19 LAB — MDC_IDC_ENUM_SESS_TYPE_INCLINIC
Battery Voltage: 2.98 V
Brady Statistic RV Percent Paced: 99 %
Implantable Pulse Generator Model: 3222
Implantable Pulse Generator Serial Number: 2981305
Lead Channel Impedance Value: 440 Ohm
Lead Channel Impedance Value: 450 Ohm
Lead Channel Pacing Threshold Pulse Width: 0.4 ms
Lead Channel Pacing Threshold Pulse Width: 0.4 ms
Lead Channel Sensing Intrinsic Amplitude: 1.6 mV
Lead Channel Sensing Intrinsic Amplitude: 12 mV
Lead Channel Setting Pacing Amplitude: 3.5 V
Lead Channel Setting Pacing Amplitude: 3.5 V
Lead Channel Setting Pacing Pulse Width: 0.4 ms
Lead Channel Setting Sensing Sensitivity: 2 mV

## 2013-02-19 NOTE — Patient Instructions (Addendum)
Your physician recommends that you schedule a follow-up appointment in February as scheduled.  Please set up your Merlin transmitter once the cellular adaptor has arrived. Please call if you do not receive your cellular adaptor within the next two weeks.

## 2013-02-22 ENCOUNTER — Encounter (HOSPITAL_COMMUNITY): Payer: BC Managed Care – PPO

## 2013-02-24 ENCOUNTER — Inpatient Hospital Stay (HOSPITAL_COMMUNITY): Admission: RE | Admit: 2013-02-24 | Payer: BC Managed Care – PPO | Source: Ambulatory Visit

## 2013-02-24 ENCOUNTER — Encounter (HOSPITAL_COMMUNITY): Payer: BC Managed Care – PPO

## 2013-02-26 ENCOUNTER — Encounter (HOSPITAL_COMMUNITY): Payer: BC Managed Care – PPO

## 2013-03-01 ENCOUNTER — Encounter: Payer: Self-pay | Admitting: Cardiovascular Disease

## 2013-03-02 ENCOUNTER — Ambulatory Visit: Payer: BC Managed Care – PPO | Admitting: Cardiovascular Disease

## 2013-03-02 MED ORDER — RIVAROXABAN 20 MG PO TABS: 20.0000 mg | ORAL_TABLET | Freq: Every day | ORAL | Status: DC

## 2013-03-02 NOTE — Telephone Encounter (Signed)
Message forwarded to S. Saunders, CMA r/t device/monitor concerns. 

## 2013-03-04 ENCOUNTER — Encounter: Payer: Self-pay | Admitting: Cardiovascular Disease

## 2013-03-04 DIAGNOSIS — Z950 Presence of cardiac pacemaker: Secondary | ICD-10-CM | POA: Insufficient documentation

## 2013-03-04 NOTE — Assessment & Plan Note (Signed)
She had a surgical Maze procedure and left atrial appendage clip. Since the pacemaker was placed there is no evidence of any atrial tachyarrhythmia. Will want to see several months of arrhythmia free pacemaker checks before we discuss discontinuing anticoagulants

## 2013-03-04 NOTE — Assessment & Plan Note (Signed)
So far appears to be a clinical CRT responder.

## 2013-03-04 NOTE — Progress Notes (Signed)
Patient ID: [name], female   DOB: [birth year], 62 y.o.   MRN: [id number]      Reason for office visit CAD status post CABG, paroxysmal atrial fibrillation, CHF  [name] has made a big turnaround since her last appointment. She feels substantially better both physically and emotionally. Activation of biventricular pacing seems to have made the biggest difference. She has much more energy and no longer has dyspnea.  Glycemic control has also improved and she'll needed to see an endocrinologist for one visit. She denies angina or dyspnea. Comprehensive pacemaker check shows normal function. There is greater than 99% ventricular pacing. There is virtually no atrial pacing. No ventricular or atrial tachyarrhythmias recorded.   No Known Allergies  Current Outpatient Prescriptions  Medication Sig Dispense Refill  . aspirin 81 MG tablet Take 1 tablet (81 mg total) by mouth daily.  30 tablet    . atorvastatin (LIPITOR) 80 MG tablet Take 1 tablet (80 mg total) by mouth daily.  90 tablet  3  . carvedilol (COREG) 12.5 MG tablet Take 12.5 mg by mouth 2 (two) times daily with a meal.      . furosemide (LASIX) 40 MG tablet Take 1 tablet (40 mg total) by mouth daily.  30 tablet  1  . glipiZIDE (GLUCOTROL) 5 MG tablet Take 1 tablet (5 mg total) by mouth 2 (two) times daily before a meal.  60 tablet  3  . glucose blood (ONETOUCH VERIO) test strip Check 2 times daily. Use as instructed  200 each  3  . losartan (COZAAR) 25 MG tablet Take 0.5 tablets (12.5 mg total) by mouth 2 (two) times daily.  30 tablet  1  . metFORMIN (GLUCOPHAGE) 1000 MG tablet Take 1 tablet (1,000 mg total) by mouth 2 (two) times daily with a meal.  60 tablet  11  . nitroGLYCERIN (NITROSTAT) 0.4 MG SL tablet Place 1 tablet (0.4 mg total) under the tongue every 5 (five) minutes as needed for chest pain.  25 tablet  3  . Rivaroxaban (XARELTO) 20 MG TABS tablet Take 1 tablet (20 mg total) by mouth daily with supper.  30 tablet  0    No current facility-administered medications for this visit.    Past Medical History  Diagnosis Date  . Diabetes mellitus type II   . Hyperlipidemia   . Hypertension   . Allergic rhinitis   . Cataract   . CAD (coronary artery disease)     a. 2004: s/p MI in [place]. No PCI->Medical RX;  b. 07/2012 Cath: LM 30-40, LAD 70p, 70/79m, D1 80-90p, OM1 small 90p, OM2 large 80-90p, 39m, 70-80d, RCA 20-30 diff, EF 40%, glob HK.s/p CABG  . Ischemic cardiomyopathy     a. 07/2012 Echo: EF 35%, Sev inferoseptal HK, mildly dil LA, Peak PASP .  Marland Kitchen PAF (paroxysmal atrial fibrillation)     a. 07/2012: Amio and xarelto initiated.  Marland Kitchen LBBB (left bundle branch block)     a. intermittent - present during rapid afib 07/2012.  . Breast cancer 2002    Patient reports left breast cancer diagnosis in 2002 treated with bilateral mastectomy positive lymph nodes with left axillary dissection followed by chemotherapy of unknown type  . Neuromuscular disorder     Patient reports chronic numbness in the right foot related to previous surgery on the right leg and "nerve damage"  . Shortness of breath     Past Surgical History  Procedure Laterality Date  . Mastectomy  2002  bilateral  . Tubal ligation  1987  . Bladder surgery  2000  . Right leg surgery  2001    torn ligaments and tendons x4 surgeries  . Appendectomy  1974  . Cholecystectomy  1974  . Abdominal hysterectomy  2000  . Breast surgery      Bilateral mastectomy for left breast cancer unknown stage but patient reports positive nodes was treated with chemotherapy  . Abdominal hysterectomy    . Coronary artery bypass graft N/A 09/28/2012    Procedure: CORONARY ARTERY BYPASS GRAFTING (CABG);  Surgeon: Delight Ovens, MD;  Location: [facility] OR;  Service: Open Heart Surgery;  Laterality: N/A;  CABG x four, using left internal mammary artery and left leg greater saphenous vein harvested endoscopically  . Maze N/A 09/28/2012    Procedure: MAZE;  Surgeon:  Delight Ovens, MD;  Location: [facility] OR;  Service: Open Heart Surgery;  Laterality: N/A;  . Intraoperative transesophageal echocardiogram N/A 09/28/2012    Procedure: INTRAOPERATIVE TRANSESOPHAGEAL ECHOCARDIOGRAM;  Surgeon: Delight Ovens, MD;  Location: [facility] OR;  Service: Open Heart Surgery;  Laterality: N/A;  . Epicardial pacing lead placement N/A 09/28/2012    Procedure: EPICARDIAL PACING LEAD PLACEMENT;  Surgeon: Delight Ovens, MD;  Location: [facility] OR;  Service: Thoracic;  Laterality: N/A;  LV LEAD PLACEMENT  . Bi-ventricular pacemaker insertion (crt-p)  01/25/2013    Dr Ladona Ridgel ([facility])    Family History  Problem Relation Age of Onset  . Kidney failure Mother   . Heart disease Mother     Died mid 82s complications of diabetes  . Arthritis Other   . Cancer Other     colon, ovarian  . Diabetes Other   . Hyperlipidemia Other   . Kidney failure Other   . Colon cancer Sister     History   Social History  . Marital Status: Married    Spouse Name: N/A    Number of Children: 3  . Years of Education: N/A   Occupational History  . Office work    Social History Main Topics  . Smoking status: Former Smoker -- 1.00 packs/day for 10 years    Types: Cigarettes    Quit date: 03/11/2012  . Smokeless tobacco: Never Used     Comment: Quit in January  . Alcohol Use: No  . Drug Use: No  . Sexual Activity: Yes    Partners: Female   Other Topics Concern  . Not on file   Social History Narrative   Lives with husband and mother in law.     Regular exercise: walking   Caffeine use: 2 cups of coffee in the morning    Review of systems: The patient specifically denies any chest pain at rest or with exertion, dyspnea at rest or with exertion, orthopnea, paroxysmal nocturnal dyspnea, syncope, palpitations, focal neurological deficits, intermittent claudication, lower extremity edema, unexplained weight gain, cough, hemoptysis or wheezing.  The patient also denies abdominal pain, nausea,  vomiting, dysphagia, diarrhea, constipation, polyuria, polydipsia, dysuria, hematuria, frequency, urgency, abnormal bleeding or bruising, fever, chills, unexpected weight changes, mood swings, change in skin or hair texture, change in voice quality, auditory or visual problems, allergic reactions or rashes, new musculoskeletal complaints other than usual "aches and pains".   PHYSICAL EXAM BP 122/70  Pulse 80  Ht 5\' 4"  (1.626 m)  Wt 153 lb (69.4 kg)  BMI 26.25 kg/m2  General: Alert, oriented x3, no distress Head: no evidence of trauma, PERRL, EOMI, no exophtalmos or lid  lag, no myxedema, no xanthelasma; normal ears, nose and oropharynx Neck: normal jugular venous pulsations and no hepatojugular reflux; brisk carotid pulses without delay and no carotid bruits Chest: clear to auscultation, no signs of consolidation by percussion or palpation, normal fremitus, symmetrical and full respiratory excursions Cardiovascular: normal position and quality of the apical impulse, regular rhythm, normal first and second heart sounds, no murmurs, rubs or gallops Abdomen: no tenderness or distention, no masses by palpation, no abnormal pulsatility or arterial bruits, normal bowel sounds, no hepatosplenomegaly Extremities: no clubbing, cyanosis or edema; 2+ radial, ulnar and brachial pulses bilaterally; 2+ right femoral, posterior tibial and dorsalis pedis pulses; 2+ left femoral, posterior tibial and dorsalis pedis pulses; no subclavian or femoral bruits Neurological: grossly nonfocal   EKG: ASensed BiVPaced  Lipid Panel     Component Value Date/Time   CHOL 144 07/18/2012 0500   TRIG 530* 07/18/2012 0500   HDL 39* 07/18/2012 0500   CHOLHDL 3.7 07/18/2012 0500   VLDL UNABLE TO CALCULATE IF TRIGLYCERIDE OVER 400 mg/dL 1/61/0960 4540   LDLCALC UNABLE TO CALCULATE IF TRIGLYCERIDE OVER 400 mg/dL 9/81/1914 7829    BMET    Component Value Date/Time   NA 138 01/25/2013 0934   K 3.4* 01/25/2013 0934   CL 99  01/25/2013 0934   CO2 27 01/25/2013 0934   GLUCOSE 236* 01/25/2013 0934   BUN 15 01/25/2013 0934   CREATININE 0.62 01/25/2013 0934   CREATININE 0.72 11/25/2012 1628   CALCIUM 9.5 01/25/2013 0934   GFRNONAA >90 01/25/2013 0934   GFRAA >90 01/25/2013 0934     ASSESSMENT AND PLAN S/P CABG x 4: 09/28/12 (LIMA-LAD, SVG-OM1-OM2, SVG-Intermediate) She has recovered finally from her superficial sternotomy infection. Is no evidence of deep infection. She does not have angina.  Chronic combined systolic and diastolic CHF, NYHA class 3 Substantial clinical improvement and now NYHA class 1-2. Clinically euvolemic.  PAF (paroxysmal atrial fibrillation) She had a surgical Maze procedure and left atrial appendage clip. Since the pacemaker was placed there is no evidence of any atrial tachyarrhythmia. Will want to see several months of arrhythmia free pacemaker checks before we discuss discontinuing anticoagulants  Biventricular cardiac pacemaker - [employer], Nov 2014 So far appears to be a clinical CRT responder.   Patient Instructions  Your physician recommends that you schedule a follow-up appointment in February as scheduled.  Please set up your Merlin transmitter once the cellular adaptor has arrived. Please call if you do not receive your cellular adaptor within the next two weeks.     Orders Placed This Encounter  Procedures  . Implantable device check   Chasin Findling  Thurmon Fair, MD, [facility] [phone] office [phone] pager

## 2013-03-04 NOTE — Assessment & Plan Note (Signed)
Substantial clinical improvement and now NYHA class 1-2. Clinically euvolemic.

## 2013-03-04 NOTE — Assessment & Plan Note (Signed)
She has recovered finally from her superficial sternotomy infection. Is no evidence of deep infection. She does not have angina.

## 2013-03-08 ENCOUNTER — Other Ambulatory Visit: Payer: Self-pay | Admitting: Cardiovascular Disease

## 2013-03-09 ENCOUNTER — Telehealth: Payer: Self-pay | Admitting: *Deleted

## 2013-03-09 NOTE — Telephone Encounter (Signed)
Rx was sent to pharmacy electronically. 

## 2013-03-09 NOTE — Telephone Encounter (Signed)
Patient informed that I requested that the cellular adaptor be sent to her home. I received notification on 03-02-13 that it was being sent. Patient encouraged to call back after the 1st in January if the adaptor has not been received. Patient voiced understanding.

## 2013-03-12 ENCOUNTER — Encounter: Payer: Self-pay | Admitting: Internal Medicine

## 2013-03-14 ENCOUNTER — Encounter (HOSPITAL_COMMUNITY): Payer: Self-pay | Admitting: Emergency Medicine

## 2013-03-14 ENCOUNTER — Emergency Department (HOSPITAL_COMMUNITY): Payer: BC Managed Care – PPO

## 2013-03-14 ENCOUNTER — Observation Stay (HOSPITAL_COMMUNITY)
Admission: EM | Admit: 2013-03-14 | Discharge: 2013-03-15 | Disposition: A | Discharge status: Home / Self Care | Payer: BC Managed Care – PPO | Attending: Internal Medicine | Admitting: Internal Medicine

## 2013-03-14 DIAGNOSIS — I447 Left bundle-branch block, unspecified: Secondary | ICD-10-CM | POA: Insufficient documentation

## 2013-03-14 DIAGNOSIS — I2589 Other forms of chronic ischemic heart disease: Secondary | ICD-10-CM | POA: Insufficient documentation

## 2013-03-14 DIAGNOSIS — H269 Unspecified cataract: Secondary | ICD-10-CM | POA: Insufficient documentation

## 2013-03-14 DIAGNOSIS — I10 Essential (primary) hypertension: Secondary | ICD-10-CM | POA: Diagnosis present

## 2013-03-14 DIAGNOSIS — E785 Hyperlipidemia, unspecified: Secondary | ICD-10-CM | POA: Insufficient documentation

## 2013-03-14 DIAGNOSIS — K56609 Unspecified intestinal obstruction, unspecified as to partial versus complete obstruction: Secondary | ICD-10-CM

## 2013-03-14 DIAGNOSIS — I4891 Unspecified atrial fibrillation: Secondary | ICD-10-CM

## 2013-03-14 DIAGNOSIS — I2510 Atherosclerotic heart disease of native coronary artery without angina pectoris: Secondary | ICD-10-CM | POA: Diagnosis present

## 2013-03-14 DIAGNOSIS — I200 Unstable angina: Secondary | ICD-10-CM

## 2013-03-14 DIAGNOSIS — R739 Hyperglycemia, unspecified: Secondary | ICD-10-CM

## 2013-03-14 DIAGNOSIS — I480 Paroxysmal atrial fibrillation: Secondary | ICD-10-CM

## 2013-03-14 DIAGNOSIS — Z87891 Personal history of nicotine dependence: Secondary | ICD-10-CM | POA: Insufficient documentation

## 2013-03-14 DIAGNOSIS — R0989 Other specified symptoms and signs involving the circulatory and respiratory systems: Secondary | ICD-10-CM

## 2013-03-14 DIAGNOSIS — E1010 Type 1 diabetes mellitus with ketoacidosis without coma: Secondary | ICD-10-CM | POA: Insufficient documentation

## 2013-03-14 DIAGNOSIS — E872 Acidosis: Secondary | ICD-10-CM | POA: Diagnosis present

## 2013-03-14 DIAGNOSIS — I5042 Chronic combined systolic (congestive) and diastolic (congestive) heart failure: Secondary | ICD-10-CM

## 2013-03-14 DIAGNOSIS — A630 Anogenital (venereal) warts: Secondary | ICD-10-CM

## 2013-03-14 DIAGNOSIS — J309 Allergic rhinitis, unspecified: Secondary | ICD-10-CM | POA: Insufficient documentation

## 2013-03-14 DIAGNOSIS — Z853 Personal history of malignant neoplasm of breast: Secondary | ICD-10-CM | POA: Insufficient documentation

## 2013-03-14 DIAGNOSIS — Z7901 Long term (current) use of anticoagulants: Secondary | ICD-10-CM | POA: Insufficient documentation

## 2013-03-14 DIAGNOSIS — E1065 Type 1 diabetes mellitus with hyperglycemia: Secondary | ICD-10-CM

## 2013-03-14 DIAGNOSIS — IMO0002 Reserved for concepts with insufficient information to code with codable children: Secondary | ICD-10-CM

## 2013-03-14 DIAGNOSIS — K439 Ventral hernia without obstruction or gangrene: Secondary | ICD-10-CM | POA: Insufficient documentation

## 2013-03-14 DIAGNOSIS — Z7982 Long term (current) use of aspirin: Secondary | ICD-10-CM | POA: Insufficient documentation

## 2013-03-14 DIAGNOSIS — K56600 Partial intestinal obstruction, unspecified as to cause: Secondary | ICD-10-CM

## 2013-03-14 DIAGNOSIS — Z950 Presence of cardiac pacemaker: Secondary | ICD-10-CM | POA: Insufficient documentation

## 2013-03-14 DIAGNOSIS — R112 Nausea with vomiting, unspecified: Secondary | ICD-10-CM | POA: Diagnosis present

## 2013-03-14 DIAGNOSIS — I252 Old myocardial infarction: Secondary | ICD-10-CM

## 2013-03-14 DIAGNOSIS — I255 Ischemic cardiomyopathy: Secondary | ICD-10-CM

## 2013-03-14 DIAGNOSIS — D72829 Elevated white blood cell count, unspecified: Secondary | ICD-10-CM | POA: Insufficient documentation

## 2013-03-14 DIAGNOSIS — E8729 Other acidosis: Secondary | ICD-10-CM | POA: Diagnosis present

## 2013-03-14 DIAGNOSIS — Z951 Presence of aortocoronary bypass graft: Secondary | ICD-10-CM

## 2013-03-14 LAB — URINALYSIS, ROUTINE W REFLEX MICROSCOPIC
Bilirubin Urine: NEGATIVE
Glucose, UA: >1000 mg/dL — AB
HGB URINE DIPSTICK: NEGATIVE
KETONES UR: 40 mg/dL — AB
Leukocytes, UA: NEGATIVE
Nitrite: NEGATIVE
Protein, ur: NEGATIVE mg/dL
SPECIFIC GRAVITY, URINE: 1.039 — AB (ref 1.005–1.030)
Urobilinogen, UA: 0.2 mg/dL (ref 0.0–1.0)
pH: 6.5 (ref 5.0–8.0)

## 2013-03-14 LAB — CBC WITH DIFFERENTIAL/PLATELET
Basophils Absolute: 0 10*3/uL (ref 0.0–0.1)
Basophils Relative: 0 % (ref 0–1)
EOS ABS: 0.1 10*3/uL (ref 0.0–0.7)
Eosinophils Relative: 1 % (ref 0–5)
HCT: 34.7 % — ABNORMAL LOW (ref 36.0–46.0)
HEMOGLOBIN: 11.8 g/dL — AB (ref 12.0–15.0)
LYMPHS ABS: 1.9 10*3/uL (ref 0.7–4.0)
Lymphocytes Relative: 12 % (ref 12–46)
MCH: 29.9 pg (ref 26.0–34.0)
MCHC: 34 g/dL (ref 30.0–36.0)
MCV: 87.8 fL (ref 78.0–100.0)
MONOS PCT: 4 % (ref 3–12)
Monocytes Absolute: 0.6 10*3/uL (ref 0.1–1.0)
NEUTROS ABS: 13.7 10*3/uL — AB (ref 1.7–7.7)
Neutrophils Relative %: 84 % — ABNORMAL HIGH (ref 43–77)
Platelets: 270 10*3/uL (ref 150–400)
RBC: 3.95 MIL/uL (ref 3.87–5.11)
RDW: 13 % (ref 11.5–15.5)
WBC: 16.2 10*3/uL — ABNORMAL HIGH (ref 4.0–10.5)

## 2013-03-14 LAB — COMPREHENSIVE METABOLIC PANEL
ALK PHOS: 57 U/L (ref 39–117)
ALT: 19 U/L (ref 0–35)
AST: 12 U/L (ref 0–37)
Albumin: 4.2 g/dL (ref 3.5–5.2)
BUN: 23 mg/dL (ref 6–23)
CHLORIDE: 96 meq/L (ref 96–112)
CO2: 25 meq/L (ref 19–32)
Calcium: 9.8 mg/dL (ref 8.4–10.5)
Creatinine, Ser: 0.7 mg/dL (ref 0.50–1.10)
GFR calc Af Amer: >90 mL/min (ref >90)
GLUCOSE: 317 mg/dL — AB (ref 70–99)
POTASSIUM: 3.8 meq/L (ref 3.7–5.3)
SODIUM: 138 meq/L (ref 137–147)
TOTAL PROTEIN: 7.6 g/dL (ref 6.0–8.3)
Total Bilirubin: 0.6 mg/dL (ref 0.3–1.2)

## 2013-03-14 LAB — POCT I-STAT TROPONIN I: Troponin i, poc: 0.01 ng/mL (ref 0.00–0.08)

## 2013-03-14 LAB — GLUCOSE, CAPILLARY
Glucose-Capillary: 144 mg/dL — ABNORMAL HIGH (ref 70–99)
Glucose-Capillary: 157 mg/dL — ABNORMAL HIGH (ref 70–99)
Glucose-Capillary: 224 mg/dL — ABNORMAL HIGH (ref 70–99)

## 2013-03-14 LAB — CG4 I-STAT (LACTIC ACID)
LACTIC ACID, VENOUS: 4.24 mmol/L — AB (ref 0.5–2.2)
Lactic Acid, Venous: 3.52 mmol/L — ABNORMAL HIGH (ref 0.5–2.2)

## 2013-03-14 LAB — LIPASE, BLOOD: Lipase: 43 U/L (ref 11–59)

## 2013-03-14 LAB — URINE MICROSCOPIC-ADD ON

## 2013-03-14 MED ORDER — SODIUM CHLORIDE 0.9 % IV BOLUS (SEPSIS)
1000.0000 mL | Freq: Once | INTRAVENOUS | Status: AC
  Administered 2013-03-14: 1000 mL via INTRAVENOUS

## 2013-03-14 MED ORDER — INSULIN ASPART 100 UNIT/ML ~~LOC~~ SOLN
0.0000 [IU] | SUBCUTANEOUS | Status: DC
  Administered 2013-03-14: 1 [IU] via SUBCUTANEOUS
  Administered 2013-03-14: 3 [IU] via SUBCUTANEOUS
  Administered 2013-03-15: 2 [IU] via SUBCUTANEOUS
  Administered 2013-03-15: 3 [IU] via SUBCUTANEOUS
  Administered 2013-03-15: 2 [IU] via SUBCUTANEOUS

## 2013-03-14 MED ORDER — NITROGLYCERIN 0.4 MG SL SUBL: 0.4000 mg | SUBLINGUAL_TABLET | SUBLINGUAL | Status: DC | PRN

## 2013-03-14 MED ORDER — ONDANSETRON HCL 4 MG/2ML IJ SOLN: 4.0000 mg | Freq: Four times a day (QID) | INTRAMUSCULAR | Status: DC | PRN

## 2013-03-14 MED ORDER — ONDANSETRON 4 MG PO TBDP
8.0000 mg | ORAL_TABLET | Freq: Once | ORAL | Status: AC
  Administered 2013-03-14: 8 mg via ORAL
  Filled 2013-03-14: qty 2

## 2013-03-14 MED ORDER — ACETAMINOPHEN 325 MG PO TABS: 650.0000 mg | ORAL_TABLET | Freq: Four times a day (QID) | ORAL | Status: DC | PRN

## 2013-03-14 MED ORDER — ATORVASTATIN CALCIUM 80 MG PO TABS
80.0000 mg | ORAL_TABLET | Freq: Every day | ORAL | Status: DC
  Administered 2013-03-14: 80 mg via ORAL
  Filled 2013-03-14 (×2): qty 1

## 2013-03-14 MED ORDER — MORPHINE SULFATE 2 MG/ML IJ SOLN: 2.0000 mg | INTRAMUSCULAR | Status: DC | PRN

## 2013-03-14 MED ORDER — HYDROMORPHONE HCL PF 1 MG/ML IJ SOLN
1.0000 mg | Freq: Once | INTRAMUSCULAR | Status: AC
Start: 2013-03-14 — End: 2013-03-14
  Administered 2013-03-14: 1 mg via INTRAVENOUS
  Filled 2013-03-14: qty 1

## 2013-03-14 MED ORDER — CARVEDILOL 12.5 MG PO TABS
12.5000 mg | ORAL_TABLET | Freq: Two times a day (BID) | ORAL | Status: DC
  Administered 2013-03-14 – 2013-03-15 (×2): 12.5 mg via ORAL
  Filled 2013-03-14 (×4): qty 1

## 2013-03-14 MED ORDER — SODIUM CHLORIDE 0.9 % IV SOLN
Freq: Once | INTRAVENOUS | Status: AC
  Administered 2013-03-14: 07:00:00 via INTRAVENOUS

## 2013-03-14 MED ORDER — ONDANSETRON HCL 4 MG/2ML IJ SOLN: 4.0000 mg | Freq: Three times a day (TID) | INTRAMUSCULAR | Status: AC | PRN

## 2013-03-14 MED ORDER — LOSARTAN POTASSIUM 25 MG PO TABS
12.5000 mg | ORAL_TABLET | Freq: Two times a day (BID) | ORAL | Status: DC
  Administered 2013-03-14: via ORAL
  Administered 2013-03-15: 12.5 mg via ORAL
  Filled 2013-03-14 (×3): qty 0.5

## 2013-03-14 MED ORDER — RIVAROXABAN 20 MG PO TABS
20.0000 mg | ORAL_TABLET | Freq: Every day | ORAL | Status: DC
  Administered 2013-03-14: 20 mg via ORAL
  Filled 2013-03-14 (×2): qty 1

## 2013-03-14 MED ORDER — ONDANSETRON HCL 4 MG/2ML IJ SOLN
4.0000 mg | Freq: Once | INTRAMUSCULAR | Status: AC
  Administered 2013-03-14: 4 mg via INTRAVENOUS
  Filled 2013-03-14: qty 2

## 2013-03-14 MED ORDER — FENTANYL CITRATE 0.05 MG/ML IJ SOLN
50.0000 ug | Freq: Once | INTRAMUSCULAR | Status: AC
  Administered 2013-03-14: 50 ug via INTRAVENOUS
  Filled 2013-03-14: qty 2

## 2013-03-14 MED ORDER — ONDANSETRON HCL 4 MG PO TABS: 4.0000 mg | ORAL_TABLET | Freq: Four times a day (QID) | ORAL | Status: DC | PRN

## 2013-03-14 MED ORDER — ACETAMINOPHEN 650 MG RE SUPP: 650.0000 mg | Freq: Four times a day (QID) | RECTAL | Status: DC | PRN

## 2013-03-14 MED ORDER — IOHEXOL 350 MG/ML SOLN
100.0000 mL | Freq: Once | INTRAVENOUS | Status: AC | PRN
  Administered 2013-03-14: 100 mL via INTRAVENOUS

## 2013-03-14 MED ORDER — SODIUM CHLORIDE 0.9 % IV SOLN
INTRAVENOUS | Status: AC
  Administered 2013-03-14: 15:00:00 via INTRAVENOUS

## 2013-03-14 NOTE — H&P (Addendum)
[facility] History and Physical  [name] [id number] DOB: 21-Feb-1951 DOA: 03/14/2013  Referring physician:  PCP: Joycelyn Man, MD  Specialists:   Chief Complaint: nausea, vomiting   HPI: [name] is a 63 y.o. female with PMH of HTN, HPL, CAD s/p CABG , h/o breast CA presented with nausea, vomiting, abdominal pain since yesterday; She went bowling last night and developed acute onset of severe central abdominal pain. This got worse and became diffuse. She started having emesis and had an episode of diarrhea. She came to ED. CTA was performed because of concern over dissection. This was concerning for p SBO. -patient was seen by surgery in ED, no surgical intervention needed at this time   Review of Systems: The patient denies anorexia, fever, weight loss,, vision loss, decreased hearing, hoarseness, chest pain, syncope, dyspnea on exertion, peripheral edema, balance deficits, hemoptysis,  hematochezia, severe indigestion/heartburn, hematuria, incontinence, genital sores, muscle weakness, suspicious skin lesions, transient blindness, difficulty walking, depression, unusual weight change, abnormal bleeding, enlarged lymph nodes, angioedema, and breast masses.    Past Medical History  Diagnosis Date  . Diabetes mellitus type II   . Hyperlipidemia   . Hypertension   . Allergic rhinitis   . Cataract   . CAD (coronary artery disease)     a. 2004: s/p MI in [place]. No PCI->Medical RX;  b. 07/2012 Cath: LM 30-40, LAD 70p, 70/71m, D1 80-90p, OM1 small 90p, OM2 large 80-90p, 20m, 70-80d, RCA 20-30 diff, EF 40%, glob HK.s/p CABG  . Ischemic cardiomyopathy     a. 07/2012 Echo: EF 35%, Sev inferoseptal HK, mildly dil LA, Peak PASP 74mmHg.  Marland Kitchen PAF (paroxysmal atrial fibrillation)     a. 07/2012: Amio and xarelto initiated.  Marland Kitchen LBBB (left bundle branch block)     a. intermittent - present during rapid afib 07/2012.  . Breast cancer 2002    Patient reports left breast cancer  diagnosis in 2002 treated with bilateral mastectomy positive lymph nodes with left axillary dissection followed by chemotherapy of unknown type  . Neuromuscular disorder     Patient reports chronic numbness in the right foot related to previous surgery on the right leg and "nerve damage"  . Shortness of breath   . Biventricular cardiac pacemaker - [employer] Jude, Nov 2014 03/04/2013   Past Surgical History  Procedure Laterality Date  . Mastectomy  2002    bilateral  . Tubal ligation  1987  . Bladder surgery  2000  . Right leg surgery  2001    torn ligaments and tendons x4 surgeries  . Appendectomy  1974  . Cholecystectomy  1974  . Abdominal hysterectomy  2000  . Breast surgery      Bilateral mastectomy for left breast cancer unknown stage but patient reports positive nodes was treated with chemotherapy  . Abdominal hysterectomy    . Coronary artery bypass graft N/A 09/28/2012    Procedure: CORONARY ARTERY BYPASS GRAFTING (CABG);  Surgeon: [name], MD;  Location: [facility];  Service: Open Heart Surgery;  Laterality: N/A;  CABG x four, using left internal mammary artery and left leg greater saphenous vein harvested endoscopically  . Maze N/A 09/28/2012    Procedure: MAZE;  Surgeon: [name], MD;  Location: [facility];  Service: Open Heart Surgery;  Laterality: N/A;  . Intraoperative transesophageal echocardiogram N/A 09/28/2012    Procedure: INTRAOPERATIVE TRANSESOPHAGEAL ECHOCARDIOGRAM;  Surgeon: [name], MD;  Location: [facility];  Service: Open Heart Surgery;  Laterality: N/A;  .  Epicardial pacing lead placement N/A 09/28/2012    Procedure: EPICARDIAL PACING LEAD PLACEMENT;  Surgeon: [name], MD;  Location: [facility];  Service: Thoracic;  Laterality: N/A;  LV LEAD PLACEMENT  . Bi-ventricular pacemaker insertion (crt-p)  01/25/2013    Dr Lovena Le ([facility])   Social History:  reports that she quit smoking about a year ago. Her smoking use included Cigarettes. She has a 10 pack-year  smoking history. She has never used smokeless tobacco. She reports that she does not drink alcohol or use illicit drugs. Yes;  where does patient live--home, ALF, SNF? and with whom if at home? No;  Can patient participate in ADLs?  No Known Allergies  Family History  Problem Relation Age of Onset  . Kidney failure Mother   . Heart disease Mother     Died mid 0000000 complications of diabetes  . Arthritis Other   . Cancer Other     colon, ovarian  . Diabetes Other   . Hyperlipidemia Other   . Kidney failure Other   . Colon cancer Sister     (be sure to complete)  Prior to Admission medications   Medication Sig Start Date End Date Taking? Authorizing Provider  aspirin 81 MG tablet Take 1 tablet (81 mg total) by mouth daily. 07/21/12  Yes Rogelia Mire, NP  atorvastatin (LIPITOR) 80 MG tablet Take 1 tablet (80 mg total) by mouth daily. 07/27/12  Yes Scott T Kathlen Mody, PA-C  carvedilol (COREG) 12.5 MG tablet Take 12.5 mg by mouth 2 (two) times daily with a meal.   Yes Historical Provider, MD  furosemide (LASIX) 40 MG tablet Take 40 mg by mouth daily.   Yes Historical Provider, MD  glipiZIDE (GLUCOTROL) 5 MG tablet Take 1 tablet (5 mg total) by mouth 2 (two) times daily before a meal. 01/15/13  Yes Philemon Kingdom, MD  losartan (COZAAR) 25 MG tablet Take 0.5 tablets (12.5 mg total) by mouth 2 (two) times daily. 01/15/13  Yes Tarri Fuller, PA-C  metFORMIN (GLUCOPHAGE) 1000 MG tablet Take 1 tablet (1,000 mg total) by mouth 2 (two) times daily with a meal. 01/15/13  Yes Philemon Kingdom, MD  Rivaroxaban (XARELTO) 20 MG TABS tablet Take 1 tablet (20 mg total) by mouth daily with supper. 03/02/13  Yes Mihai Croitoru, MD  glucose blood (ONETOUCH VERIO) test strip Check 2 times daily. Use as instructed 02/11/13   Dorena Cookey, MD  nitroGLYCERIN (NITROSTAT) 0.4 MG SL tablet Place 1 tablet (0.4 mg total) under the tongue every 5 (five) minutes as needed for chest pain. 07/21/12   Rogelia Mire, NP    Physical Exam: Filed Vitals:   03/14/13 1000  BP: 131/72  Pulse: 95  Temp:   Resp: 14     General:  alert  Eyes: eom-i, perrla   ENT: no oral ulcers   Neck: supple   Cardiovascular: s1,s2 rrr  Respiratory: CTA bl   Abdomen: soft, nt, nd   Skin: no rash  Musculoskeletal: no LE edema  Psychiatric: no hallucinations   Neurologic: CN 2-12 intact  Labs on Admission:  Basic Metabolic Panel:  Recent Labs Lab 03/14/13 0302  NA 138  K 3.8  CL 96  CO2 25  GLUCOSE 317*  BUN 23  CREATININE 0.70  CALCIUM 9.8   Liver Function Tests:  Recent Labs Lab 03/14/13 0302  AST 12  ALT 19  ALKPHOS 57  BILITOT 0.6  PROT 7.6  ALBUMIN 4.2    Recent Labs Lab  03/14/13 0302  LIPASE 43   No results found for this basename: AMMONIA,  in the last 168 hours CBC:  Recent Labs Lab 03/14/13 0302  WBC 16.2*  NEUTROABS 13.7*  HGB 11.8*  HCT 34.7*  MCV 87.8  PLT 270   Cardiac Enzymes: No results found for this basename: CKTOTAL, CKMB, CKMBINDEX, TROPONINI,  in the last 168 hours  BNP (last 3 results) No results found for this basename: PROBNP,  in the last 8760 hours CBG: No results found for this basename: GLUCAP,  in the last 168 hours  Radiological Exams on Admission: Ct Angio Chest Aorta W/cm &/or Wo/cm  03/14/2013   CLINICAL DATA:  Rule out dissecting aneurysm or mesenteric ischemia.  EXAM: CT ANGIOGRAPHY CHEST, ABDOMEN AND PELVIS  TECHNIQUE: Multidetector CT imaging through the chest, abdomen and pelvis was performed using the standard protocol during bolus administration of intravenous contrast. Multiplanar reconstructed images including MIPs were obtained and reviewed to evaluate the vascular anatomy.  CONTRAST:  19mL OMNIPAQUE IOHEXOL 350 MG/ML SOLN  COMPARISON:  None.  FINDINGS: CTA CHEST FINDINGS  THORACIC INLET/BODY WALL:  Bilateral breast implants.  Bilateral axillary dissection.  MEDIASTINUM:  CABG. No pericardial effusion. Probable resection of the  left atrial appendage. Unremarkable positioning of left subclavian approach dual-chamber pacer wires. No evidence of pulmonary embolism or aortic dissection. Small ductus bump. No thoracic aortic aneurysm. No lymphadenopathy. No cardiomegaly.  LUNG WINDOWS:  Mild reticulation at the bases is likely atelectasis. Scarring or atelectasis in the lower lingula.  OSSEOUS:  No acute fracture.  No suspicious lytic or blastic lesions.  Review of the MIP images confirms the above findings.  CTA ABDOMEN AND PELVIS FINDINGS  BODY WALL: Unremarkable. Fatty hernia in the left groin. Status post lower abdominal wall hernia repair, with small fatty herniation right of midline.  LOWER CHEST: Unremarkable.  ABDOMEN/PELVIS:  Liver: No focal abnormality.  Biliary: Cholecystectomy.  Pancreas: Unremarkable.  Spleen: Unremarkable.  Adrenals: Unremarkable.  Kidneys and ureters: No hydronephrosis or stone.  Bladder: Unremarkable.  Reproductive: Hysterectomy.  Bowel: The mid and distal small bowel is fluid distended, with focal transition point in the right lower quadrant, where there is angulated loops and herniation of omental fat through the anterior abdominal wall. There is minimal mesenteric fat stranding in this region. The bowel content proximally is fecalized. No evidence of bowel devascularization The neighboring vascular structures are patent.  Retroperitoneum: No mass or adenopathy.  Peritoneum: No free fluid or gas.  Vascular: Diffuse atherosclerosis. The sMA, celiac axis, and IMA are widely patent. Coronal imaging suggest subtle beading of the right renal artery, not definitive for fibromuscular dysplasia however. Bilateral superficial femoral artery narrowings, up to 50% on the right. No evidence of aortic aneurysm or dissection.  OSSEOUS: No acute abnormalities.  Review of the MIP images confirms the above findings.  IMPRESSION: 1. No evidence of aortic aneurysm, dissection, or significantmesenteric stenosis. 2. Partial small  bowel obstruction with transition point in the right lower quadrant. There is a closely neighboring ventral hernia containing omentum.   Electronically Signed   By: Jorje Guild M.D.   On: 03/14/2013 06:36   Ct Angio Abd/pel W/ And/or W/o  03/14/2013   CLINICAL DATA:  Rule out dissecting aneurysm or mesenteric ischemia.  EXAM: CT ANGIOGRAPHY CHEST, ABDOMEN AND PELVIS  TECHNIQUE: Multidetector CT imaging through the chest, abdomen and pelvis was performed using the standard protocol during bolus administration of intravenous contrast. Multiplanar reconstructed images including MIPs were obtained and reviewed to  evaluate the vascular anatomy.  CONTRAST:  13mL OMNIPAQUE IOHEXOL 350 MG/ML SOLN  COMPARISON:  None.  FINDINGS: CTA CHEST FINDINGS  THORACIC INLET/BODY WALL:  Bilateral breast implants.  Bilateral axillary dissection.  MEDIASTINUM:  CABG. No pericardial effusion. Probable resection of the left atrial appendage. Unremarkable positioning of left subclavian approach dual-chamber pacer wires. No evidence of pulmonary embolism or aortic dissection. Small ductus bump. No thoracic aortic aneurysm. No lymphadenopathy. No cardiomegaly.  LUNG WINDOWS:  Mild reticulation at the bases is likely atelectasis. Scarring or atelectasis in the lower lingula.  OSSEOUS:  No acute fracture.  No suspicious lytic or blastic lesions.  Review of the MIP images confirms the above findings.  CTA ABDOMEN AND PELVIS FINDINGS  BODY WALL: Unremarkable. Fatty hernia in the left groin. Status post lower abdominal wall hernia repair, with small fatty herniation right of midline.  LOWER CHEST: Unremarkable.  ABDOMEN/PELVIS:  Liver: No focal abnormality.  Biliary: Cholecystectomy.  Pancreas: Unremarkable.  Spleen: Unremarkable.  Adrenals: Unremarkable.  Kidneys and ureters: No hydronephrosis or stone.  Bladder: Unremarkable.  Reproductive: Hysterectomy.  Bowel: The mid and distal small bowel is fluid distended, with focal transition point  in the right lower quadrant, where there is angulated loops and herniation of omental fat through the anterior abdominal wall. There is minimal mesenteric fat stranding in this region. The bowel content proximally is fecalized. No evidence of bowel devascularization The neighboring vascular structures are patent.  Retroperitoneum: No mass or adenopathy.  Peritoneum: No free fluid or gas.  Vascular: Diffuse atherosclerosis. The sMA, celiac axis, and IMA are widely patent. Coronal imaging suggest subtle beading of the right renal artery, not definitive for fibromuscular dysplasia however. Bilateral superficial femoral artery narrowings, up to 50% on the right. No evidence of aortic aneurysm or dissection.  OSSEOUS: No acute abnormalities.  Review of the MIP images confirms the above findings.  IMPRESSION: 1. No evidence of aortic aneurysm, dissection, or significantmesenteric stenosis. 2. Partial small bowel obstruction with transition point in the right lower quadrant. There is a closely neighboring ventral hernia containing omentum.   Electronically Signed   By: Jorje Guild M.D.   On: 03/14/2013 06:36    EKG: Independently reviewed.   Assessment/Plan Principal Problem:   High anion gap metabolic acidosis Active Problems:   DIABETES MELLITUS TYPE 1-UNCONTROLLED   HYPERTENSION   CAD (coronary artery disease)   Nausea & vomiting   63 y.o. female with PMH of HTN, HPL, CAD s/p CABG , h/o breast CA presented with nausea, vomiting, abdominal pain   1. Abdominal pain, nausea vomiting CT: concern for partial SBO;  -symptoms resolved in ED; exam no s/s of acute abdomen;  -cont IVF, NPO/except meds/diet later if tolerates; antiemetics; supportive care; hold diuretics   2. DM uncontrolled; hold metformin, gli[pizide while NPO -ISS,check HA1c;   3. CAD s/p CABG; no acute chest pain; cont home regimen   4. Leukocytosis likely stress related; no clinical s/s of infection; monitor off atx;  5. PAF on  xarelto; cont home regimen   Surgery;  if consultant consulted, please document name and whether formally or informally consulted  Code Status: full (must indicate code status--if unknown or must be presumed, indicate so) Family Communication: daughter at the bedside (indicate person spoken with, if applicable, with phone number if by telephone) Disposition Plan: home in 24-48 hours  (indicate anticipated LOS)  Time spent:>35 minutes   Kinnie Feil [facility] Pager [phone]  If 7PM-7AM, please contact night-coverage [email or web address] Password  [email or web address] 03/14/2013, 12:30 PM

## 2013-03-14 NOTE — ED Provider Notes (Signed)
CSN: [id number]     Arrival date & time 03/14/13  0217 History   First MD Initiated Contact with Patient 03/14/13 [phone]     Chief Complaint  Patient presents with  . Abdominal Pain  . Nausea  . Emesis   (Consider location/radiation/quality/duration/timing/severity/associated sxs/prior Treatment) The history is provided by the patient.  [name] is a 63 y.o. female hx of DM, HL, HTN, CAD, multiple abdominal surgeries here with ab pain, vomiting. She ate some food and then around midnight had a sudden onset abdominal pain that radiated to her back. It was sharp and also associated with bilious vomiting. She also felt that her abdomen was more bloated than usual. Denies any chest pain or shortness of breath.    Past Medical History  Diagnosis Date  . Diabetes mellitus type II   . Hyperlipidemia   . Hypertension   . Allergic rhinitis   . Cataract   . CAD (coronary artery disease)     a. 2004: s/p MI in [place]. No PCI->Medical RX;  b. 07/2012 Cath: LM 30-40, LAD 70p, 70/44m, D1 80-90p, OM1 small 90p, OM2 large 80-90p, 52m, 70-80d, RCA 20-30 diff, EF 40%, glob HK.s/p CABG  . Ischemic cardiomyopathy     a. 07/2012 Echo: EF 35%, Sev inferoseptal HK, mildly dil LA, Peak PASP .  Marland Kitchen PAF (paroxysmal atrial fibrillation)     a. 07/2012: Amio and xarelto initiated.  Marland Kitchen LBBB (left bundle branch block)     a. intermittent - present during rapid afib 07/2012.  . Breast cancer 2002    Patient reports left breast cancer diagnosis in 2002 treated with bilateral mastectomy positive lymph nodes with left axillary dissection followed by chemotherapy of unknown type  . Neuromuscular disorder     Patient reports chronic numbness in the right foot related to previous surgery on the right leg and "nerve damage"  . Shortness of breath   . Biventricular cardiac pacemaker - [employer] Jude, Nov 2014 03/04/2013   Past Surgical History  Procedure Laterality Date  . Mastectomy  2002    bilateral  . Tubal ligation   1987  . Bladder surgery  2000  . Right leg surgery  2001    torn ligaments and tendons x4 surgeries  . Appendectomy  1974  . Cholecystectomy  1974  . Abdominal hysterectomy  2000  . Breast surgery      Bilateral mastectomy for left breast cancer unknown stage but patient reports positive nodes was treated with chemotherapy  . Abdominal hysterectomy    . Coronary artery bypass graft N/A 09/28/2012    Procedure: CORONARY ARTERY BYPASS GRAFTING (CABG);  Surgeon: Delight Ovens, MD;  Location: [facility] OR;  Service: Open Heart Surgery;  Laterality: N/A;  CABG x four, using left internal mammary artery and left leg greater saphenous vein harvested endoscopically  . Maze N/A 09/28/2012    Procedure: MAZE;  Surgeon: Delight Ovens, MD;  Location: [facility] OR;  Service: Open Heart Surgery;  Laterality: N/A;  . Intraoperative transesophageal echocardiogram N/A 09/28/2012    Procedure: INTRAOPERATIVE TRANSESOPHAGEAL ECHOCARDIOGRAM;  Surgeon: Delight Ovens, MD;  Location: [facility] OR;  Service: Open Heart Surgery;  Laterality: N/A;  . Epicardial pacing lead placement N/A 09/28/2012    Procedure: EPICARDIAL PACING LEAD PLACEMENT;  Surgeon: Delight Ovens, MD;  Location: [facility] OR;  Service: Thoracic;  Laterality: N/A;  LV LEAD PLACEMENT  . Bi-ventricular pacemaker insertion (crt-p)  01/25/2013    Dr Ladona Ridgel ([facility])   Family History  Problem Relation Age of Onset  . Kidney failure Mother   . Heart disease Mother     Died mid 27O complications of diabetes  . Arthritis Other   . Cancer Other     colon, ovarian  . Diabetes Other   . Hyperlipidemia Other   . Kidney failure Other   . Colon cancer Sister    History  Substance Use Topics  . Smoking status: Former Smoker -- 1.00 packs/day for 10 years    Types: Cigarettes    Quit date: 03/11/2012  . Smokeless tobacco: Never Used     Comment: Quit in January  . Alcohol Use: No   OB History   Grav Para Term Preterm Abortions TAB SAB Ect Mult Living                  Review of Systems  Gastrointestinal: Positive for vomiting and abdominal pain.  All other systems reviewed and are negative.    Allergies  Review of patient's allergies indicates no known allergies.  Home Medications   Current Outpatient Rx  Name  Route  Sig  Dispense  Refill  . aspirin 81 MG tablet   Oral   Take 1 tablet (81 mg total) by mouth daily.   30 tablet      . atorvastatin (LIPITOR) 80 MG tablet   Oral   Take 1 tablet (80 mg total) by mouth daily.   90 tablet   3   . carvedilol (COREG) 12.5 MG tablet   Oral   Take 12.5 mg by mouth 2 (two) times daily with a meal.         . furosemide (LASIX) 40 MG tablet   Oral   Take 40 mg by mouth daily.         Marland Kitchen glipiZIDE (GLUCOTROL) 5 MG tablet   Oral   Take 1 tablet (5 mg total) by mouth 2 (two) times daily before a meal.   60 tablet   3   . losartan (COZAAR) 25 MG tablet   Oral   Take 0.5 tablets (12.5 mg total) by mouth 2 (two) times daily.   30 tablet   1   . metFORMIN (GLUCOPHAGE) 1000 MG tablet   Oral   Take 1 tablet (1,000 mg total) by mouth 2 (two) times daily with a meal.   60 tablet   11     No refills available   . Rivaroxaban (XARELTO) 20 MG TABS tablet   Oral   Take 1 tablet (20 mg total) by mouth daily with supper.   30 tablet   0     Lot: [id number] Exp: 09/2015   . glucose blood (ONETOUCH VERIO) test strip      Check 2 times daily. Use as instructed   200 each   3   . nitroGLYCERIN (NITROSTAT) 0.4 MG SL tablet   Sublingual   Place 1 tablet (0.4 mg total) under the tongue every 5 (five) minutes as needed for chest pain.   25 tablet   3    BP 135/67  Pulse 95  Temp(Src) 97.6 F (36.4 C) (Oral)  Resp 14  SpO2 100% Physical Exam  Nursing note and vitals reviewed. Constitutional: She is oriented to person, place, and time.  Uncomfortable   HENT:  Head: Normocephalic.  MM dry   Eyes: Conjunctivae are normal. Pupils are equal, round, and reactive to light.  Neck:  Normal range of motion. Neck supple.  Cardiovascular: Normal rate, regular  rhythm and normal heart sounds.   Pulmonary/Chest: Effort normal and breath sounds normal. No respiratory distress. She has no wheezes. She has no rales.  Abdominal: Soft.  Distended, mild diffuse tenderness, no rebound   Musculoskeletal: Normal range of motion.  Neurological: She is alert and oriented to person, place, and time. No cranial nerve deficit. Coordination normal.  Skin: Skin is warm and dry.  Psychiatric: She has a normal mood and affect. Her behavior is normal. Judgment and thought content normal.    ED Course  Procedures (including critical care time) Labs Review Labs Reviewed  CBC WITH DIFFERENTIAL - Abnormal; Notable for the following:    WBC 16.2 (*)    Hemoglobin 11.8 (*)    HCT 34.7 (*)    Neutrophils Relative % 84 (*)    Neutro Abs 13.7 (*)    All other components within normal limits  COMPREHENSIVE METABOLIC PANEL - Abnormal; Notable for the following:    Glucose, Bld 317 (*)    All other components within normal limits  URINALYSIS, ROUTINE W REFLEX MICROSCOPIC - Abnormal; Notable for the following:    Specific Gravity, Urine 1.039 (*)    Glucose, UA >1000 (*)    Ketones, ur 40 (*)    All other components within normal limits  CG4 I-STAT (LACTIC ACID) - Abnormal; Notable for the following:    Lactic Acid, Venous 3.52 (*)    All other components within normal limits  LIPASE, BLOOD  URINE MICROSCOPIC-ADD ON  POCT I-STAT TROPONIN I   Imaging Review Ct Angio Chest Aorta W/cm &/or Wo/cm  03/14/2013   CLINICAL DATA:  Rule out dissecting aneurysm or mesenteric ischemia.  EXAM: CT ANGIOGRAPHY CHEST, ABDOMEN AND PELVIS  TECHNIQUE: Multidetector CT imaging through the chest, abdomen and pelvis was performed using the standard protocol during bolus administration of intravenous contrast. Multiplanar reconstructed images including MIPs were obtained and reviewed to evaluate the vascular anatomy.   CONTRAST:  147mL OMNIPAQUE IOHEXOL 350 MG/ML SOLN  COMPARISON:  None.  FINDINGS: CTA CHEST FINDINGS  THORACIC INLET/BODY WALL:  Bilateral breast implants.  Bilateral axillary dissection.  MEDIASTINUM:  CABG. No pericardial effusion. Probable resection of the left atrial appendage. Unremarkable positioning of left subclavian approach dual-chamber pacer wires. No evidence of pulmonary embolism or aortic dissection. Small ductus bump. No thoracic aortic aneurysm. No lymphadenopathy. No cardiomegaly.  LUNG WINDOWS:  Mild reticulation at the bases is likely atelectasis. Scarring or atelectasis in the lower lingula.  OSSEOUS:  No acute fracture.  No suspicious lytic or blastic lesions.  Review of the MIP images confirms the above findings.  CTA ABDOMEN AND PELVIS FINDINGS  BODY WALL: Unremarkable. Fatty hernia in the left groin. Status post lower abdominal wall hernia repair, with small fatty herniation right of midline.  LOWER CHEST: Unremarkable.  ABDOMEN/PELVIS:  Liver: No focal abnormality.  Biliary: Cholecystectomy.  Pancreas: Unremarkable.  Spleen: Unremarkable.  Adrenals: Unremarkable.  Kidneys and ureters: No hydronephrosis or stone.  Bladder: Unremarkable.  Reproductive: Hysterectomy.  Bowel: The mid and distal small bowel is fluid distended, with focal transition point in the right lower quadrant, where there is angulated loops and herniation of omental fat through the anterior abdominal wall. There is minimal mesenteric fat stranding in this region. The bowel content proximally is fecalized. No evidence of bowel devascularization The neighboring vascular structures are patent.  Retroperitoneum: No mass or adenopathy.  Peritoneum: No free fluid or gas.  Vascular: Diffuse atherosclerosis. The sMA, celiac axis, and IMA are widely patent.  Coronal imaging suggest subtle beading of the right renal artery, not definitive for fibromuscular dysplasia however. Bilateral superficial femoral artery narrowings, up to 50%  on the right. No evidence of aortic aneurysm or dissection.  OSSEOUS: No acute abnormalities.  Review of the MIP images confirms the above findings.  IMPRESSION: 1. No evidence of aortic aneurysm, dissection, or significantmesenteric stenosis. 2. Partial small bowel obstruction with transition point in the right lower quadrant. There is a closely neighboring ventral hernia containing omentum.   Electronically Signed   By: Jorje Guild M.D.   On: 03/14/2013 06:36   Ct Angio Abd/pel W/ And/or W/o  03/14/2013   CLINICAL DATA:  Rule out dissecting aneurysm or mesenteric ischemia.  EXAM: CT ANGIOGRAPHY CHEST, ABDOMEN AND PELVIS  TECHNIQUE: Multidetector CT imaging through the chest, abdomen and pelvis was performed using the standard protocol during bolus administration of intravenous contrast. Multiplanar reconstructed images including MIPs were obtained and reviewed to evaluate the vascular anatomy.  CONTRAST:  14mL OMNIPAQUE IOHEXOL 350 MG/ML SOLN  COMPARISON:  None.  FINDINGS: CTA CHEST FINDINGS  THORACIC INLET/BODY WALL:  Bilateral breast implants.  Bilateral axillary dissection.  MEDIASTINUM:  CABG. No pericardial effusion. Probable resection of the left atrial appendage. Unremarkable positioning of left subclavian approach dual-chamber pacer wires. No evidence of pulmonary embolism or aortic dissection. Small ductus bump. No thoracic aortic aneurysm. No lymphadenopathy. No cardiomegaly.  LUNG WINDOWS:  Mild reticulation at the bases is likely atelectasis. Scarring or atelectasis in the lower lingula.  OSSEOUS:  No acute fracture.  No suspicious lytic or blastic lesions.  Review of the MIP images confirms the above findings.  CTA ABDOMEN AND PELVIS FINDINGS  BODY WALL: Unremarkable. Fatty hernia in the left groin. Status post lower abdominal wall hernia repair, with small fatty herniation right of midline.  LOWER CHEST: Unremarkable.  ABDOMEN/PELVIS:  Liver: No focal abnormality.  Biliary: Cholecystectomy.   Pancreas: Unremarkable.  Spleen: Unremarkable.  Adrenals: Unremarkable.  Kidneys and ureters: No hydronephrosis or stone.  Bladder: Unremarkable.  Reproductive: Hysterectomy.  Bowel: The mid and distal small bowel is fluid distended, with focal transition point in the right lower quadrant, where there is angulated loops and herniation of omental fat through the anterior abdominal wall. There is minimal mesenteric fat stranding in this region. The bowel content proximally is fecalized. No evidence of bowel devascularization The neighboring vascular structures are patent.  Retroperitoneum: No mass or adenopathy.  Peritoneum: No free fluid or gas.  Vascular: Diffuse atherosclerosis. The sMA, celiac axis, and IMA are widely patent. Coronal imaging suggest subtle beading of the right renal artery, not definitive for fibromuscular dysplasia however. Bilateral superficial femoral artery narrowings, up to 50% on the right. No evidence of aortic aneurysm or dissection.  OSSEOUS: No acute abnormalities.  Review of the MIP images confirms the above findings.  IMPRESSION: 1. No evidence of aortic aneurysm, dissection, or significantmesenteric stenosis. 2. Partial small bowel obstruction with transition point in the right lower quadrant. There is a closely neighboring ventral hernia containing omentum.   Electronically Signed   By: Jorje Guild M.D.   On: 03/14/2013 06:36    EKG Interpretation   None       MDM  No diagnosis found. Jordis Dain is a 63 y.o. female here with ab pain, vomiting. I was concerned possible SBO vs dissection vs mesenteric ischemia.   5 AM Lactate elevated at 3.5. Will get CT angio to r/o dissection vs mesenteric ischemia.   7:10 AM CT showed  partial SBO. Patient wants to leave. I called Dr. Barry Dienes, who will see patient. Tolerated some PO fluids. I also ordered repeat lactate. I signed out to Dr. Audie Pinto in the ED to follow up surgery consult and reassess patient.   Wandra Arthurs,  MD 03/14/13 [phone]

## 2013-03-14 NOTE — ED Notes (Signed)
Extreme abd. Pain, hot and cold sweats since dinner tonite. Pain radiating to back. Bile emesis

## 2013-03-14 NOTE — ED Notes (Signed)
Lactic Acid reported to Dr.Yao 

## 2013-03-14 NOTE — ED Notes (Signed)
Pt was able top keep PO fluids down with out any complications

## 2013-03-14 NOTE — Consult Note (Signed)
Reason for Consult:p SBO Referring Physician: [name] is an 62 y.o. female.  HPI:  Pt is a 63 yo F who presents with around 12 hours of nausea, vomiting, and abdominal pain.  She went bowling last night and developed acute onset of severe central abdominal pain.  This got worse and became diffuse.  She started having emesis and had an episode of diarrhea.  She came to ED.  CTA was performed because of concern over dissection.  This was concerning for p SBO.  She got some pain medication, and now is completely asymptomatic.  She denies fever/ chills/sick contacts.  She did not take her diabetic meds today.  She has had some decreased bowel movements and constipation since her cardiac surgery this summer and since her pacer in November.     Past Medical History  Diagnosis Date  . Diabetes mellitus type II   . Hyperlipidemia   . Hypertension   . Allergic rhinitis   . Cataract   . CAD (coronary artery disease)     a. 2004: s/p MI in [place]. No PCI->Medical RX;  b. 07/2012 Cath: LM 30-40, LAD 70p, 70/60m D1 80-90p, OM1 small 90p, OM2 large 80-90p, 572m70-80d, RCA 20-30 diff, EF 40%, glob HK.s/p CABG  . Ischemic cardiomyopathy     a. 07/2012 Echo: EF 35%, Sev inferoseptal HK, mildly dil LA, Peak PASP 5958m.  . PMarland KitchenF (paroxysmal atrial fibrillation)     a. 07/2012: Amio and xarelto initiated.  . LMarland KitchenBB (left bundle branch block)     a. intermittent - present during rapid afib 07/2012.  . Breast cancer 2002    Patient reports left breast cancer diagnosis in 2002 treated with bilateral mastectomy positive lymph nodes with left axillary dissection followed by chemotherapy of unknown type  . Neuromuscular disorder     Patient reports chronic numbness in the right foot related to previous surgery on the right leg and "nerve damage"  . Shortness of breath   . Biventricular cardiac pacemaker - [employer] Jude, Nov 2014 03/04/2013    Past Surgical History  Procedure Laterality Date  . Mastectomy   2002    bilateral  . Tubal ligation  1987  . Bladder surgery  2000  . Right leg surgery  2001    torn ligaments and tendons x4 surgeries  . Appendectomy  1974  . Cholecystectomy  1974  . Abdominal hysterectomy  2000  . Breast surgery      Bilateral mastectomy for left breast cancer unknown stage but patient reports positive nodes was treated with chemotherapy  . Abdominal hysterectomy    . Coronary artery bypass graft N/A 09/28/2012    Procedure: CORONARY ARTERY BYPASS GRAFTING (CABG);  Surgeon: EdwGrace IsaacD;  [facility];  Laterality: N/A;  CABG x four, using left internal mammary artery and left leg greater saphenous vein harvested endoscopically  . Maze N/A 09/28/2012    Procedure: MAZE;  Surgeon: EdwGrace IsaacD;  [facility];  Laterality: N/A;  . Intraoperative transesophageal echocardiogram N/A 09/28/2012    Procedure: INTRAOPERATIVE TRANSESOPHAGEAL ECHOCARDIOGRAM;  Surgeon: EdwGrace IsaacD;  [facility];  Laterality: N/A;  . Epicardial pacing lead placement N/A 09/28/2012    Procedure: EPICARDIAL PACING LEAD PLACEMENT;  Surgeon: EdwGrace IsaacD;  [facility];  Laterality: N/A;  LV LEAD PLACEMENT  . Bi-ventricular pacemaker  insertion (crt-p)  01/25/2013    Dr Lovena Le ([facility])    Family History  Problem Relation Age of Onset  . Kidney failure Mother   . Heart disease Mother     Died mid 16B complications of diabetes  . Arthritis Other   . Cancer Other     colon, ovarian  . Diabetes Other   . Hyperlipidemia Other   . Kidney failure Other   . Colon cancer Sister     Social History:  reports that she quit smoking about a year ago. Her smoking use included Cigarettes. She has a 10 pack-year smoking history. She has never used smokeless tobacco. She reports that she does not drink alcohol or use illicit drugs.  Allergies: No Known  Allergies  Medications: I have reviewed the patient's current medications. MedicationsLong-Term  Outpatient Medications Hospital Medications    aspirin 81 MG tablet   atorvastatin (LIPITOR) 80 MG tablet   carvedilol (COREG) 12.5 MG tablet   furosemide (LASIX) 40 MG tablet   glipiZIDE (GLUCOTROL) 5 MG tablet   losartan (COZAAR) 25 MG tablet   metFORMIN (GLUCOPHAGE) 1000 MG tablet   Rivaroxaban (XARELTO) 20 MG TABS tablet   glucose blood (ONETOUCH VERIO) test strip   nitroGLYCERIN (NITROSTAT) 0.4 MG SL tablet     Results for orders placed during the hospital encounter of 03/14/13 (from the past 48 hour(s))  CBC WITH DIFFERENTIAL     Status: Abnormal   Collection Time    03/14/13  3:02 AM      Result Value Range   WBC 16.2 (*) 4.0 - 10.5 K/uL   RBC 3.95  3.87 - 5.11 MIL/uL   Hemoglobin 11.8 (*) 12.0 - 15.0 g/dL   HCT 34.7 (*) 36.0 - 46.0 %   MCV 87.8  78.0 - 100.0 fL   MCH 29.9  26.0 - 34.0 pg   MCHC 34.0  30.0 - 36.0 g/dL   RDW 13.0  11.5 - 15.5 %   Platelets 270  150 - 400 K/uL   Neutrophils Relative % 84 (*) 43 - 77 %   Neutro Abs 13.7 (*) 1.7 - 7.7 K/uL   Lymphocytes Relative 12  12 - 46 %   Lymphs Abs 1.9  0.7 - 4.0 K/uL   Monocytes Relative 4  3 - 12 %   Monocytes Absolute 0.6  0.1 - 1.0 K/uL   Eosinophils Relative 1  0 - 5 %   Eosinophils Absolute 0.1  0.0 - 0.7 K/uL   Basophils Relative 0  0 - 1 %   Basophils Absolute 0.0  0.0 - 0.1 K/uL  COMPREHENSIVE METABOLIC PANEL     Status: Abnormal   Collection Time    03/14/13  3:02 AM      Result Value Range   Sodium 138  137 - 147 mEq/L   Comment: Please note change in reference range.   Potassium 3.8  3.7 - 5.3 mEq/L   Comment: Please note change in reference range.   Chloride 96  96 - 112 mEq/L   CO2 25  19 - 32 mEq/L   Glucose, Bld 317 (*) 70 - 99 mg/dL   BUN 23  6 - 23 mg/dL   Creatinine, Ser 0.70  0.50 - 1.10 mg/dL   Calcium 9.8  8.4 - 10.5 mg/dL   Total Protein 7.6  6.0 - 8.3 g/dL   Albumin 4.2  3.5 - 5.2  g/dL   AST 12  0 - 37 U/L  ALT 19  0 - 35 U/L   Alkaline Phosphatase 57  39 - 117 U/L   Total Bilirubin 0.6  0.3 - 1.2 mg/dL   GFR calc non Af Amer >90  >90 mL/min   GFR calc Af Amer >90  >90 mL/min   Comment: (NOTE)     The eGFR has been calculated using the CKD EPI equation.     This calculation has not been validated in all clinical situations.     eGFR's persistently <90 mL/min signify possible Chronic Kidney     Disease.  LIPASE, BLOOD     Status: None   Collection Time    03/14/13  3:02 AM      Result Value Range   Lipase 43  11 - 59 U/L  POCT I-STAT TROPONIN I     Status: None   Collection Time    03/14/13  3:04 AM      Result Value Range   Troponin i, poc 0.01  0.00 - 0.08 ng/mL   Comment 3            Comment: Due to the release kinetics of cTnI,     a negative result within the first hours     of the onset of symptoms does not rule out     myocardial infarction with certainty.     If myocardial infarction is still suspected,     repeat the test at appropriate intervals.  CG4 I-STAT (LACTIC ACID)     Status: Abnormal   Collection Time    03/14/13  4:51 AM      Result Value Range   Lactic Acid, Venous 3.52 (*) 0.5 - 2.2 mmol/L  URINALYSIS, ROUTINE W REFLEX MICROSCOPIC     Status: Abnormal   Collection Time    03/14/13  5:58 AM      Result Value Range   Color, Urine YELLOW  YELLOW   APPearance CLEAR  CLEAR   Specific Gravity, Urine 1.039 (*) 1.005 - 1.030   pH 6.5  5.0 - 8.0   Glucose, UA >1000 (*) NEGATIVE mg/dL   Hgb urine dipstick NEGATIVE  NEGATIVE   Bilirubin Urine NEGATIVE  NEGATIVE   Ketones, ur 40 (*) NEGATIVE mg/dL   Protein, ur NEGATIVE  NEGATIVE mg/dL   Urobilinogen, UA 0.2  0.0 - 1.0 mg/dL   Nitrite NEGATIVE  NEGATIVE   Leukocytes, UA NEGATIVE  NEGATIVE  URINE MICROSCOPIC-ADD ON     Status: None   Collection Time    03/14/13  5:58 AM      Result Value Range   RBC / HPF 0-2  <3 RBC/hpf    Ct Angio Chest Aorta W/cm &/or Wo/cm  03/14/2013    CLINICAL DATA:  Rule out dissecting aneurysm or mesenteric ischemia.  EXAM: CT ANGIOGRAPHY CHEST, ABDOMEN AND PELVIS  TECHNIQUE: Multidetector CT imaging through the chest, abdomen and pelvis was performed using the standard protocol during bolus administration of intravenous contrast. Multiplanar reconstructed images including MIPs were obtained and reviewed to evaluate the vascular anatomy.  CONTRAST:  159m OMNIPAQUE IOHEXOL 350 MG/ML SOLN  COMPARISON:  None.  FINDINGS: CTA CHEST FINDINGS  THORACIC INLET/BODY WALL:  Bilateral breast implants.  Bilateral axillary dissection.  MEDIASTINUM:  CABG. No pericardial effusion. Probable resection of the left atrial appendage. Unremarkable positioning of left subclavian approach dual-chamber pacer wires. No evidence of pulmonary embolism or aortic dissection. Small ductus bump. No thoracic aortic aneurysm. No lymphadenopathy. No cardiomegaly.  LUNG WINDOWS:  Mild  reticulation at the bases is likely atelectasis. Scarring or atelectasis in the lower lingula.  OSSEOUS:  No acute fracture.  No suspicious lytic or blastic lesions.  Review of the MIP images confirms the above findings.  CTA ABDOMEN AND PELVIS FINDINGS  BODY WALL: Unremarkable. Fatty hernia in the left groin. Status post lower abdominal wall hernia repair, with small fatty herniation right of midline.  LOWER CHEST: Unremarkable.  ABDOMEN/PELVIS:  Liver: No focal abnormality.  Biliary: Cholecystectomy.  Pancreas: Unremarkable.  Spleen: Unremarkable.  Adrenals: Unremarkable.  Kidneys and ureters: No hydronephrosis or stone.  Bladder: Unremarkable.  Reproductive: Hysterectomy.  Bowel: The mid and distal small bowel is fluid distended, with focal transition point in the right lower quadrant, where there is angulated loops and herniation of omental fat through the anterior abdominal wall. There is minimal mesenteric fat stranding in this region. The bowel content proximally is fecalized. No evidence of bowel  devascularization The neighboring vascular structures are patent.  Retroperitoneum: No mass or adenopathy.  Peritoneum: No free fluid or gas.  Vascular: Diffuse atherosclerosis. The sMA, celiac axis, and IMA are widely patent. Coronal imaging suggest subtle beading of the right renal artery, not definitive for fibromuscular dysplasia however. Bilateral superficial femoral artery narrowings, up to 50% on the right. No evidence of aortic aneurysm or dissection.  OSSEOUS: No acute abnormalities.  Review of the MIP images confirms the above findings.  IMPRESSION: 1. No evidence of aortic aneurysm, dissection, or significantmesenteric stenosis. 2. Partial small bowel obstruction with transition point in the right lower quadrant. There is a closely neighboring ventral hernia containing omentum.   Electronically Signed   By: [name] M.D.   On: 03/14/2013 06:36   Ct Angio Abd/pel W/ And/or W/o  03/14/2013   CLINICAL DATA:  Rule out dissecting aneurysm or mesenteric ischemia.  EXAM: CT ANGIOGRAPHY CHEST, ABDOMEN AND PELVIS  TECHNIQUE: Multidetector CT imaging through the chest, abdomen and pelvis was performed using the standard protocol during bolus administration of intravenous contrast. Multiplanar reconstructed images including MIPs were obtained and reviewed to evaluate the vascular anatomy.  CONTRAST:  158m OMNIPAQUE IOHEXOL 350 MG/ML SOLN  COMPARISON:  None.  FINDINGS: CTA CHEST FINDINGS  THORACIC INLET/BODY WALL:  Bilateral breast implants.  Bilateral axillary dissection.  MEDIASTINUM:  CABG. No pericardial effusion. Probable resection of the left atrial appendage. Unremarkable positioning of left subclavian approach dual-chamber pacer wires. No evidence of pulmonary embolism or aortic dissection. Small ductus bump. No thoracic aortic aneurysm. No lymphadenopathy. No cardiomegaly.  LUNG WINDOWS:  Mild reticulation at the bases is likely atelectasis. Scarring or atelectasis in the lower lingula.  OSSEOUS:   No acute fracture.  No suspicious lytic or blastic lesions.  Review of the MIP images confirms the above findings.  CTA ABDOMEN AND PELVIS FINDINGS  BODY WALL: Unremarkable. Fatty hernia in the left groin. Status post lower abdominal wall hernia repair, with small fatty herniation right of midline.  LOWER CHEST: Unremarkable.  ABDOMEN/PELVIS:  Liver: No focal abnormality.  Biliary: Cholecystectomy.  Pancreas: Unremarkable.  Spleen: Unremarkable.  Adrenals: Unremarkable.  Kidneys and ureters: No hydronephrosis or stone.  Bladder: Unremarkable.  Reproductive: Hysterectomy.  Bowel: The mid and distal small bowel is fluid distended, with focal transition point in the right lower quadrant, where there is angulated loops and herniation of omental fat through the anterior abdominal wall. There is minimal mesenteric fat stranding in this region. The bowel content proximally is fecalized. No evidence of bowel devascularization The neighboring vascular structures are  patent.  Retroperitoneum: No mass or adenopathy.  Peritoneum: No free fluid or gas.  Vascular: Diffuse atherosclerosis. The sMA, celiac axis, and IMA are widely patent. Coronal imaging suggest subtle beading of the right renal artery, not definitive for fibromuscular dysplasia however. Bilateral superficial femoral artery narrowings, up to 50% on the right. No evidence of aortic aneurysm or dissection.  OSSEOUS: No acute abnormalities.  Review of the MIP images confirms the above findings.  IMPRESSION: 1. No evidence of aortic aneurysm, dissection, or significantmesenteric stenosis. 2. Partial small bowel obstruction with transition point in the right lower quadrant. There is a closely neighboring ventral hernia containing omentum.   Electronically Signed   By: [name] M.D.   On: 03/14/2013 06:36    Review of Systems  Constitutional: Negative.   HENT: Negative.   Eyes: Negative.   Respiratory: Negative.   Cardiovascular: Negative.     Gastrointestinal: Positive for nausea, vomiting, abdominal pain, diarrhea and constipation.  Genitourinary: Negative.   Musculoskeletal: Negative.   Skin: Negative.   Neurological: Negative.   Endo/Heme/Allergies: Negative.   Psychiatric/Behavioral: Negative.    Blood pressure 132/62, pulse 94, temperature 97.6 F (36.4 C), temperature source Oral, resp. rate 13, SpO2 100.00%. Physical Exam  Constitutional: She is oriented to person, place, and time. She appears well-developed and well-nourished. No distress.  HENT:  Head: Normocephalic and atraumatic.  Mouth/Throat: No oropharyngeal exudate.  Eyes: Conjunctivae are normal. Pupils are equal, round, and reactive to light. No scleral icterus.  Neck: Normal range of motion. Neck supple. No tracheal deviation present. No thyromegaly present.  Cardiovascular: Normal rate, regular rhythm, normal heart sounds and intact distal pulses.  Exam reveals no gallop and no friction rub.   No murmur heard. Respiratory: Effort normal. No respiratory distress. She has no wheezes. She has no rales. She exhibits no tenderness.  GI: Soft. Bowel sounds are normal. She exhibits no distension. There is no tenderness. There is no rebound and no guarding.  Hernia in tram flap on right abdomen.  Musculoskeletal: Normal range of motion. She exhibits no edema and no tenderness.  Lymphadenopathy:    She has no cervical adenopathy.  Neurological: She is alert and oriented to person, place, and time. Coordination normal.  Skin: Skin is warm and dry. No rash noted. She is not diaphoretic. No erythema. No pallor.  Psychiatric: She has a normal mood and affect. Her behavior is normal. Judgment and thought content normal.    Assessment/Plan: p SBO Elevated lactate Leukocytosis DM HTN  Pt appears to have partial obstruction, as she is still passing gas and having some small bowel movements.  The CT shows fecalization of small bowel material, which may indicate  chronic partial obstruction.   Normally, if someone was passing gas and having BMs, it would be OK to send home with liquid diet.  However, repeat lactate is still elevated, would probably recommend staying with hydration/NPO.    Pt is very hyperglycemic as well, and is spilling glucose in urine.  This is likely contributing to dehydration.  Recommend medicine evaluation.    Repeat plain films in AM.  [name] 03/14/2013, 8:13 AM

## 2013-03-14 NOTE — ED Notes (Signed)
Attempt to give report to [facility]

## 2013-03-14 NOTE — ED Notes (Signed)
Patient transported to CT 

## 2013-03-14 NOTE — ED Notes (Signed)
Pt admitted to 5N room 9, pt transported via stretcher by [place] EMT, report given to [facility]

## 2013-03-15 ENCOUNTER — Observation Stay (HOSPITAL_COMMUNITY): Payer: BC Managed Care – PPO

## 2013-03-15 DIAGNOSIS — R112 Nausea with vomiting, unspecified: Secondary | ICD-10-CM

## 2013-03-15 DIAGNOSIS — E872 Acidosis, unspecified: Secondary | ICD-10-CM

## 2013-03-15 LAB — BASIC METABOLIC PANEL
BUN: 7 mg/dL (ref 6–23)
CO2: 24 mEq/L (ref 19–32)
CREATININE: 0.45 mg/dL — AB (ref 0.50–1.10)
Calcium: 8.2 mg/dL — ABNORMAL LOW (ref 8.4–10.5)
Chloride: 105 mEq/L (ref 96–112)
GFR calc Af Amer: >90 mL/min (ref >90)
Glucose, Bld: 180 mg/dL — ABNORMAL HIGH (ref 70–99)
POTASSIUM: 3.5 meq/L — AB (ref 3.7–5.3)
Sodium: 141 mEq/L (ref 137–147)

## 2013-03-15 LAB — CBC
HEMATOCRIT: 32.7 % — AB (ref 36.0–46.0)
Hemoglobin: 10.8 g/dL — ABNORMAL LOW (ref 12.0–15.0)
MCH: 30.2 pg (ref 26.0–34.0)
MCHC: 33 g/dL (ref 30.0–36.0)
MCV: 91.3 fL (ref 78.0–100.0)
Platelets: 216 10*3/uL (ref 150–400)
RBC: 3.58 MIL/uL — ABNORMAL LOW (ref 3.87–5.11)
RDW: 13.5 % (ref 11.5–15.5)
WBC: 7 10*3/uL (ref 4.0–10.5)

## 2013-03-15 LAB — GLUCOSE, CAPILLARY
Glucose-Capillary: 158 mg/dL — ABNORMAL HIGH (ref 70–99)
Glucose-Capillary: 214 mg/dL — ABNORMAL HIGH (ref 70–99)
Glucose-Capillary: 187 mg/dL — ABNORMAL HIGH (ref 70–99)

## 2013-03-15 LAB — HEMOGLOBIN A1C
HEMOGLOBIN A1C: 7.6 % — AB (ref <5.7)
MEAN PLASMA GLUCOSE: 171 mg/dL — AB (ref <117)

## 2013-03-15 NOTE — Discharge Summary (Signed)
Physician Discharge Summary  Ajai Terhaar IDP:[id number] DOB: 01-30-1951 DOA: 03/14/2013  PCP: Joycelyn Man, MD  Admit date: 03/14/2013 Discharge date: 03/15/2013  Time spent: >35 minutes  Recommendations for Outpatient Follow-up:  F/u with PCP in 1 week as needed  Discharge Diagnoses:  Principal Problem:   High anion gap metabolic acidosis Active Problems:   DIABETES MELLITUS TYPE 1-UNCONTROLLED   HYPERTENSION   CAD (coronary artery disease)   Nausea & vomiting   SBO (small bowel obstruction)   Discharge Condition: stable   Diet recommendation: heart healthy, DM  There were no vitals filed for this visit.  History of present illness:  63 y.o. female with PMH of HTN, HPL, CAD s/p CABG , h/o breast CA presented with nausea, vomiting, abdominal pain   Hospital Course:  1. Abdominal pain, nausea vomiting CT: concern for partial SBO;  -symptoms resolved in ED; exam no s/s of acute abdomen;  -d/c home today if tolerates diet;  2. DM resume metformin, gli[pizide 3. CAD s/p CABG; no acute chest pain; cont home regimen  4. Leukocytosis likely stress related; no clinical s/s of infection; resolved  5. PAF on xarelto; cont home regimen       Procedures:  none (i.e. Studies not automatically included, echos, thoracentesis, etc; not x-rays)  Consultations:  surgery  Discharge Exam: Filed Vitals:   03/15/13 0913  BP:   Pulse: 100  Temp:   Resp:     General: alert Cardiovascular: s1,s2 rrr Respiratory: CTA BL  Discharge Instructions  Discharge Orders   Future Appointments Provider Department Dept Phone   04/27/2013 3:00 PM [name], MD [facility] [phone]   05/13/2013 1:45 PM Dorena Cookey, MD [facility]   Future Orders Complete By Expires   Diet - low sodium heart healthy  As directed    Discharge instructions  As directed    Comments:     Please follow up with primary care doctor in 12 week    Increase activity slowly  As directed        Medication List         aspirin 81 MG tablet  Take 1 tablet (81 mg total) by mouth daily.     atorvastatin 80 MG tablet  Commonly known as:  LIPITOR  Take 1 tablet (80 mg total) by mouth daily.     carvedilol 12.5 MG tablet  Commonly known as:  COREG  Take 12.5 mg by mouth 2 (two) times daily with a meal.     furosemide 40 MG tablet  Commonly known as:  LASIX  Take 40 mg by mouth daily.     glipiZIDE 5 MG tablet  Commonly known as:  GLUCOTROL  Take 1 tablet (5 mg total) by mouth 2 (two) times daily before a meal.     glucose blood test strip  Commonly known as:  ONETOUCH VERIO  Check 2 times daily. Use as instructed     losartan 25 MG tablet  Commonly known as:  COZAAR  Take 0.5 tablets (12.5 mg total) by mouth 2 (two) times daily.     metFORMIN 1000 MG tablet  Commonly known as:  GLUCOPHAGE  Take 1 tablet (1,000 mg total) by mouth 2 (two) times daily with a meal.     nitroGLYCERIN 0.4 MG SL tablet  Commonly known as:  NITROSTAT  Place 1 tablet (0.4 mg total) under the tongue every 5 (five) minutes as needed for chest pain.  Rivaroxaban 20 MG Tabs tablet  Commonly known as:  XARELTO  Take 1 tablet (20 mg total) by mouth daily with supper.       No Known Allergies     Follow-up Information   Follow up with TODD,JEFFREY ALLEN, MD In 1 week.   Specialty:  Family Medicine   Contact information:   [place] [address] [phone]        The results of significant diagnostics from this hospitalization (including imaging, microbiology, ancillary and laboratory) are listed below for reference.    Significant Diagnostic Studies: Ct Angio Chest Aorta W/cm &/or Wo/cm  03/14/2013   CLINICAL DATA:  Rule out dissecting aneurysm or mesenteric ischemia.  EXAM: CT ANGIOGRAPHY CHEST, ABDOMEN AND PELVIS  TECHNIQUE: Multidetector CT imaging through the chest, abdomen and pelvis was performed using  the standard protocol during bolus administration of intravenous contrast. Multiplanar reconstructed images including MIPs were obtained and reviewed to evaluate the vascular anatomy.  CONTRAST:  176mL OMNIPAQUE IOHEXOL 350 MG/ML SOLN  COMPARISON:  None.  FINDINGS: CTA CHEST FINDINGS  THORACIC INLET/BODY WALL:  Bilateral breast implants.  Bilateral axillary dissection.  MEDIASTINUM:  CABG. No pericardial effusion. Probable resection of the left atrial appendage. Unremarkable positioning of left subclavian approach dual-chamber pacer wires. No evidence of pulmonary embolism or aortic dissection. Small ductus bump. No thoracic aortic aneurysm. No lymphadenopathy. No cardiomegaly.  LUNG WINDOWS:  Mild reticulation at the bases is likely atelectasis. Scarring or atelectasis in the lower lingula.  OSSEOUS:  No acute fracture.  No suspicious lytic or blastic lesions.  Review of the MIP images confirms the above findings.  CTA ABDOMEN AND PELVIS FINDINGS  BODY WALL: Unremarkable. Fatty hernia in the left groin. Status post lower abdominal wall hernia repair, with small fatty herniation right of midline.  LOWER CHEST: Unremarkable.  ABDOMEN/PELVIS:  Liver: No focal abnormality.  Biliary: Cholecystectomy.  Pancreas: Unremarkable.  Spleen: Unremarkable.  Adrenals: Unremarkable.  Kidneys and ureters: No hydronephrosis or stone.  Bladder: Unremarkable.  Reproductive: Hysterectomy.  Bowel: The mid and distal small bowel is fluid distended, with focal transition point in the right lower quadrant, where there is angulated loops and herniation of omental fat through the anterior abdominal wall. There is minimal mesenteric fat stranding in this region. The bowel content proximally is fecalized. No evidence of bowel devascularization The neighboring vascular structures are patent.  Retroperitoneum: No mass or adenopathy.  Peritoneum: No free fluid or gas.  Vascular: Diffuse atherosclerosis. The sMA, celiac axis, and IMA are widely  patent. Coronal imaging suggest subtle beading of the right renal artery, not definitive for fibromuscular dysplasia however. Bilateral superficial femoral artery narrowings, up to 50% on the right. No evidence of aortic aneurysm or dissection.  OSSEOUS: No acute abnormalities.  Review of the MIP images confirms the above findings.  IMPRESSION: 1. No evidence of aortic aneurysm, dissection, or significantmesenteric stenosis. 2. Partial small bowel obstruction with transition point in the right lower quadrant. There is a closely neighboring ventral hernia containing omentum.   Electronically Signed   By: Jorje Guild M.D.   On: 03/14/2013 06:36   Ct Angio Abd/pel W/ And/or W/o  03/14/2013   CLINICAL DATA:  Rule out dissecting aneurysm or mesenteric ischemia.  EXAM: CT ANGIOGRAPHY CHEST, ABDOMEN AND PELVIS  TECHNIQUE: Multidetector CT imaging through the chest, abdomen and pelvis was performed using the standard protocol during bolus administration of intravenous contrast. Multiplanar reconstructed images including MIPs were obtained and reviewed to evaluate  the vascular anatomy.  CONTRAST:  142mL OMNIPAQUE IOHEXOL 350 MG/ML SOLN  COMPARISON:  None.  FINDINGS: CTA CHEST FINDINGS  THORACIC INLET/BODY WALL:  Bilateral breast implants.  Bilateral axillary dissection.  MEDIASTINUM:  CABG. No pericardial effusion. Probable resection of the left atrial appendage. Unremarkable positioning of left subclavian approach dual-chamber pacer wires. No evidence of pulmonary embolism or aortic dissection. Small ductus bump. No thoracic aortic aneurysm. No lymphadenopathy. No cardiomegaly.  LUNG WINDOWS:  Mild reticulation at the bases is likely atelectasis. Scarring or atelectasis in the lower lingula.  OSSEOUS:  No acute fracture.  No suspicious lytic or blastic lesions.  Review of the MIP images confirms the above findings.  CTA ABDOMEN AND PELVIS FINDINGS  BODY WALL: Unremarkable. Fatty hernia in the left groin. Status post  lower abdominal wall hernia repair, with small fatty herniation right of midline.  LOWER CHEST: Unremarkable.  ABDOMEN/PELVIS:  Liver: No focal abnormality.  Biliary: Cholecystectomy.  Pancreas: Unremarkable.  Spleen: Unremarkable.  Adrenals: Unremarkable.  Kidneys and ureters: No hydronephrosis or stone.  Bladder: Unremarkable.  Reproductive: Hysterectomy.  Bowel: The mid and distal small bowel is fluid distended, with focal transition point in the right lower quadrant, where there is angulated loops and herniation of omental fat through the anterior abdominal wall. There is minimal mesenteric fat stranding in this region. The bowel content proximally is fecalized. No evidence of bowel devascularization The neighboring vascular structures are patent.  Retroperitoneum: No mass or adenopathy.  Peritoneum: No free fluid or gas.  Vascular: Diffuse atherosclerosis. The sMA, celiac axis, and IMA are widely patent. Coronal imaging suggest subtle beading of the right renal artery, not definitive for fibromuscular dysplasia however. Bilateral superficial femoral artery narrowings, up to 50% on the right. No evidence of aortic aneurysm or dissection.  OSSEOUS: No acute abnormalities.  Review of the MIP images confirms the above findings.  IMPRESSION: 1. No evidence of aortic aneurysm, dissection, or significantmesenteric stenosis. 2. Partial small bowel obstruction with transition point in the right lower quadrant. There is a closely neighboring ventral hernia containing omentum.   Electronically Signed   By: Jorje Guild M.D.   On: 03/14/2013 06:36    Microbiology: No results found for this or any previous visit (from the past 240 hour(s)).   Labs: Basic Metabolic Panel:  Recent Labs Lab 03/14/13 0302 03/15/13 0510  NA 138 141  K 3.8 3.5*  CL 96 105  CO2 25 24  GLUCOSE 317* 180*  BUN 23 7  CREATININE 0.70 0.45*  CALCIUM 9.8 8.2*   Liver Function Tests:  Recent Labs Lab 03/14/13 0302  AST 12   ALT 19  ALKPHOS 57  BILITOT 0.6  PROT 7.6  ALBUMIN 4.2    Recent Labs Lab 03/14/13 0302  LIPASE 43   No results found for this basename: AMMONIA,  in the last 168 hours CBC:  Recent Labs Lab 03/14/13 0302 03/15/13 0510  WBC 16.2* 7.0  NEUTROABS 13.7*  --   HGB 11.8* 10.8*  HCT 34.7* 32.7*  MCV 87.8 91.3  PLT 270 216   Cardiac Enzymes: No results found for this basename: CKTOTAL, CKMB, CKMBINDEX, TROPONINI,  in the last 168 hours BNP: BNP (last 3 results) No results found for this basename: PROBNP,  in the last 8760 hours CBG:  Recent Labs Lab 03/14/13 1619 03/14/13 2001 03/14/13 2340 03/15/13 0421 03/15/13 0744  GLUCAP 144* 224* 157* 158* 214*       Signed:  Kinnie Feil  [facility]  03/15/2013, 10:27 AM

## 2013-03-15 NOTE — Progress Notes (Signed)
Patient ID: [name], female   DOB: 1950/09/10, 63 y.o.   MRN: [id number]    Subjective: Pt feels great today.  Has had 2 BMs.  Tolerating clear liquids without any nausea or vomiting  Objective: Vital signs in last 24 hours: Temp:  [98.4 F (36.9 C)-98.6 F (37 C)] 98.6 F (37 C) (01/05 0423) Pulse Rate:  [85-100] 100 (01/05 0913) Resp:  [18-19] 19 (01/05 0423) BP: (113-147)/(60-97) 132/64 mmHg (01/05 0423) SpO2:  [91 %-100 %] 98 % (01/05 0423) Last BM Date: 03/13/13  Intake/Output from previous day: 01/04 0701 - 01/05 0700 In: 4620 [P.O.:120; I.V.:4500] Out: 3 [Urine:3] Intake/Output this shift:    PE: Abd: soft, TN, ND, +BS  Lab Results:   Recent Labs  03/14/13 0302 03/15/13 0510  WBC 16.2* 7.0  HGB 11.8* 10.8*  HCT 34.7* 32.7*  PLT 270 216   BMET  Recent Labs  03/14/13 0302 03/15/13 0510  NA 138 141  K 3.8 3.5*  CL 96 105  CO2 25 24  GLUCOSE 317* 180*  BUN 23 7  CREATININE 0.70 0.45*  CALCIUM 9.8 8.2*   PT/INR No results found for this basename: LABPROT, INR,  in the last 72 hours CMP     Component Value Date/Time   NA 141 03/15/2013 0510   K 3.5* 03/15/2013 0510   CL 105 03/15/2013 0510   CO2 24 03/15/2013 0510   GLUCOSE 180* 03/15/2013 0510   BUN 7 03/15/2013 0510   CREATININE 0.45* 03/15/2013 0510   CREATININE 0.72 11/25/2012 1628   CALCIUM 8.2* 03/15/2013 0510   PROT 7.6 03/14/2013 0302   ALBUMIN 4.2 03/14/2013 0302   AST 12 03/14/2013 0302   ALT 19 03/14/2013 0302   ALKPHOS 57 03/14/2013 0302   BILITOT 0.6 03/14/2013 0302   GFRNONAA >90 03/15/2013 0510   GFRAA >90 03/15/2013 0510   Lipase     Component Value Date/Time   LIPASE 43 03/14/2013 0302       Studies/Results: Ct Angio Chest Aorta W/cm &/or Wo/cm  03/14/2013   CLINICAL DATA:  Rule out dissecting aneurysm or mesenteric ischemia.  EXAM: CT ANGIOGRAPHY CHEST, ABDOMEN AND PELVIS  TECHNIQUE: Multidetector CT imaging through the chest, abdomen and pelvis was performed using the standard protocol  during bolus administration of intravenous contrast. Multiplanar reconstructed images including MIPs were obtained and reviewed to evaluate the vascular anatomy.  CONTRAST:  OMNIPAQUE IOHEXOL 350 MG/ML SOLN  COMPARISON:  None.  FINDINGS: CTA CHEST FINDINGS  THORACIC INLET/BODY WALL:  Bilateral breast implants.  Bilateral axillary dissection.  MEDIASTINUM:  CABG. No pericardial effusion. Probable resection of the left atrial appendage. Unremarkable positioning of left subclavian approach dual-chamber pacer wires. No evidence of pulmonary embolism or aortic dissection. Small ductus bump. No thoracic aortic aneurysm. No lymphadenopathy. No cardiomegaly.  LUNG WINDOWS:  Mild reticulation at the bases is likely atelectasis. Scarring or atelectasis in the lower lingula.  OSSEOUS:  No acute fracture.  No suspicious lytic or blastic lesions.  Review of the MIP images confirms the above findings.  CTA ABDOMEN AND PELVIS FINDINGS  BODY WALL: Unremarkable. Fatty hernia in the left groin. Status post lower abdominal wall hernia repair, with small fatty herniation right of midline.  LOWER CHEST: Unremarkable.  ABDOMEN/PELVIS:  Liver: No focal abnormality.  Biliary: Cholecystectomy.  Pancreas: Unremarkable.  Spleen: Unremarkable.  Adrenals: Unremarkable.  Kidneys and ureters: No hydronephrosis or stone.  Bladder: Unremarkable.  Reproductive: Hysterectomy.  Bowel: The mid and distal small bowel is fluid  distended, with focal transition point in the right lower quadrant, where there is angulated loops and herniation of omental fat through the anterior abdominal wall. There is minimal mesenteric fat stranding in this region. The bowel content proximally is fecalized. No evidence of bowel devascularization The neighboring vascular structures are patent.  Retroperitoneum: No mass or adenopathy.  Peritoneum: No free fluid or gas.  Vascular: Diffuse atherosclerosis. The sMA, celiac axis, and IMA are widely patent. Coronal imaging  suggest subtle beading of the right renal artery, not definitive for fibromuscular dysplasia however. Bilateral superficial femoral artery narrowings, up to 50% on the right. No evidence of aortic aneurysm or dissection.  OSSEOUS: No acute abnormalities.  Review of the MIP images confirms the above findings.  IMPRESSION: 1. No evidence of aortic aneurysm, dissection, or significantmesenteric stenosis. 2. Partial small bowel obstruction with transition point in the right lower quadrant. There is a closely neighboring ventral hernia containing omentum.   Electronically Signed   By: Tiburcio Pea M.D.   On: 03/14/2013 06:36   Ct Angio Abd/pel W/ And/or W/o  03/14/2013   CLINICAL DATA:  Rule out dissecting aneurysm or mesenteric ischemia.  EXAM: CT ANGIOGRAPHY CHEST, ABDOMEN AND PELVIS  TECHNIQUE: Multidetector CT imaging through the chest, abdomen and pelvis was performed using the standard protocol during bolus administration of intravenous contrast. Multiplanar reconstructed images including MIPs were obtained and reviewed to evaluate the vascular anatomy.  CONTRAST:  OMNIPAQUE IOHEXOL 350 MG/ML SOLN  COMPARISON:  None.  FINDINGS: CTA CHEST FINDINGS  THORACIC INLET/BODY WALL:  Bilateral breast implants.  Bilateral axillary dissection.  MEDIASTINUM:  CABG. No pericardial effusion. Probable resection of the left atrial appendage. Unremarkable positioning of left subclavian approach dual-chamber pacer wires. No evidence of pulmonary embolism or aortic dissection. Small ductus bump. No thoracic aortic aneurysm. No lymphadenopathy. No cardiomegaly.  LUNG WINDOWS:  Mild reticulation at the bases is likely atelectasis. Scarring or atelectasis in the lower lingula.  OSSEOUS:  No acute fracture.  No suspicious lytic or blastic lesions.  Review of the MIP images confirms the above findings.  CTA ABDOMEN AND PELVIS FINDINGS  BODY WALL: Unremarkable. Fatty hernia in the left groin. Status post lower abdominal wall  hernia repair, with small fatty herniation right of midline.  LOWER CHEST: Unremarkable.  ABDOMEN/PELVIS:  Liver: No focal abnormality.  Biliary: Cholecystectomy.  Pancreas: Unremarkable.  Spleen: Unremarkable.  Adrenals: Unremarkable.  Kidneys and ureters: No hydronephrosis or stone.  Bladder: Unremarkable.  Reproductive: Hysterectomy.  Bowel: The mid and distal small bowel is fluid distended, with focal transition point in the right lower quadrant, where there is angulated loops and herniation of omental fat through the anterior abdominal wall. There is minimal mesenteric fat stranding in this region. The bowel content proximally is fecalized. No evidence of bowel devascularization The neighboring vascular structures are patent.  Retroperitoneum: No mass or adenopathy.  Peritoneum: No free fluid or gas.  Vascular: Diffuse atherosclerosis. The sMA, celiac axis, and IMA are widely patent. Coronal imaging suggest subtle beading of the right renal artery, not definitive for fibromuscular dysplasia however. Bilateral superficial femoral artery narrowings, up to 50% on the right. No evidence of aortic aneurysm or dissection.  OSSEOUS: No acute abnormalities.  Review of the MIP images confirms the above findings.  IMPRESSION: 1. No evidence of aortic aneurysm, dissection, or significantmesenteric stenosis. 2. Partial small bowel obstruction with transition point in the right lower quadrant. There is a closely neighboring ventral hernia containing omentum.   Electronically Signed  By: Tiburcio Pea M.D.   On: 03/14/2013 06:36    Anti-infectives: Anti-infectives   None       Assessment/Plan  1. PSBO, seems improved  Plan: 1. Will advance to a low fiber diet.  If she tolerates this, she is stable for dc home from our standpoint.   LOS: 1 day    Vera Wishart E 03/15/2013, 10:12 AM Pager: [phone]

## 2013-03-15 NOTE — Progress Notes (Signed)
RN reviewed discharge instructions with patient. Patient stated understanding.   RN then walked with patient outside to husbands car.

## 2013-03-16 ENCOUNTER — Telehealth: Payer: Self-pay | Admitting: Cardiovascular Disease

## 2013-03-16 NOTE — Telephone Encounter (Signed)
Wants to talk to nurse regarding a fluttering feeling she keeps getting  Was just discharged from hospital  Please call

## 2013-03-16 NOTE — Telephone Encounter (Signed)
Returned call.  Left message to call back before 4pm.  Pt scheduled to see Dr. Sallyanne Kuster on 1.8.15.

## 2013-03-17 ENCOUNTER — Other Ambulatory Visit: Payer: Self-pay | Admitting: Physician Assistant

## 2013-03-18 ENCOUNTER — Ambulatory Visit (INDEPENDENT_AMBULATORY_CARE_PROVIDER_SITE_OTHER): Payer: BC Managed Care – PPO | Admitting: Cardiovascular Disease

## 2013-03-18 ENCOUNTER — Telehealth: Payer: Self-pay | Admitting: Cardiovascular Disease

## 2013-03-18 ENCOUNTER — Encounter: Payer: Self-pay | Admitting: Cardiovascular Disease

## 2013-03-18 VITALS — BP 146/86 | HR 76 | Ht 64.5 in | Wt 153.4 lb

## 2013-03-18 DIAGNOSIS — I2589 Other forms of chronic ischemic heart disease: Secondary | ICD-10-CM

## 2013-03-18 DIAGNOSIS — I5042 Chronic combined systolic (congestive) and diastolic (congestive) heart failure: Secondary | ICD-10-CM

## 2013-03-18 DIAGNOSIS — I480 Paroxysmal atrial fibrillation: Secondary | ICD-10-CM

## 2013-03-18 DIAGNOSIS — Z951 Presence of aortocoronary bypass graft: Secondary | ICD-10-CM

## 2013-03-18 DIAGNOSIS — I255 Ischemic cardiomyopathy: Secondary | ICD-10-CM

## 2013-03-18 DIAGNOSIS — I509 Heart failure, unspecified: Secondary | ICD-10-CM

## 2013-03-18 DIAGNOSIS — I25810 Atherosclerosis of coronary artery bypass graft(s) without angina pectoris: Secondary | ICD-10-CM

## 2013-03-18 DIAGNOSIS — I5043 Acute on chronic combined systolic (congestive) and diastolic (congestive) heart failure: Secondary | ICD-10-CM

## 2013-03-18 DIAGNOSIS — I4891 Unspecified atrial fibrillation: Secondary | ICD-10-CM

## 2013-03-18 DIAGNOSIS — Z950 Presence of cardiac pacemaker: Secondary | ICD-10-CM

## 2013-03-18 LAB — MDC_IDC_ENUM_SESS_TYPE_INCLINIC
Battery Voltage: 2.99 V
Brady Statistic RA Percent Paced: 1 % — CL
Brady Statistic RV Percent Paced: 98 %
Implantable Pulse Generator Model: 3222
Implantable Pulse Generator Serial Number: 2981305
Lead Channel Impedance Value: 450 Ohm
Lead Channel Impedance Value: 480 Ohm
Lead Channel Pacing Threshold Amplitude: 0.75 V
Lead Channel Pacing Threshold Amplitude: 1.75 V
Lead Channel Pacing Threshold Pulse Width: 0.4 ms
Lead Channel Pacing Threshold Pulse Width: 0.4 ms
Lead Channel Pacing Threshold Pulse Width: 0.5 ms
Lead Channel Sensing Intrinsic Amplitude: 2.2 mV
Lead Channel Setting Pacing Amplitude: 2.5 V
Lead Channel Setting Sensing Sensitivity: 2 mV
MDC IDC MSMT LEADCHNL LV IMPEDANCE VALUE: 350 Ohm
MDC IDC MSMT LEADCHNL RV PACING THRESHOLD AMPLITUDE: 1 V
MDC IDC MSMT LEADCHNL RV SENSING INTR AMPL: 12 mV
MDC IDC SET LEADCHNL LV PACING AMPLITUDE: 2.25 V
MDC IDC SET LEADCHNL LV PACING PULSEWIDTH: 0.5 ms
MDC IDC SET LEADCHNL RA PACING AMPLITUDE: 2 V
MDC IDC SET LEADCHNL RV PACING PULSEWIDTH: 0.4 ms

## 2013-03-18 LAB — PACEMAKER DEVICE OBSERVATION

## 2013-03-18 MED ORDER — LOSARTAN POTASSIUM 25 MG PO TABS: 25.0000 mg | ORAL_TABLET | Freq: Two times a day (BID) | ORAL | Status: DC

## 2013-03-18 NOTE — Telephone Encounter (Signed)
Rx was sent to pharmacy electronically. 

## 2013-03-18 NOTE — Patient Instructions (Addendum)
Please go home and set up your Merlin transmitter then all the office to confirm that the transmission was received.  Your physician has recommended you make the following change in your medication: Take Lasix 80 mg daily x 2 days and start Losartan 25 mg twice daily.  Your physician recommends that you weigh, daily, at the same time every day, and in the same amount of clothing. Please record your daily weights on the handout provided and bring it to your next appointment.  Keep February appointment as scheduled.

## 2013-03-18 NOTE — Telephone Encounter (Signed)
Returning [facility] call . Has info [facility] asked for.  Wants Tobin Chad to call

## 2013-03-19 ENCOUNTER — Ambulatory Visit: Payer: BC Managed Care – PPO | Admitting: Internal Medicine

## 2013-03-19 ENCOUNTER — Encounter: Payer: Self-pay | Admitting: Cardiovascular Disease

## 2013-03-19 DIAGNOSIS — I5043 Acute on chronic combined systolic (congestive) and diastolic (congestive) heart failure: Secondary | ICD-10-CM

## 2013-03-19 HISTORY — DX: Acute on chronic combined systolic (congestive) and diastolic (congestive) heart failure: I50.43

## 2013-03-19 NOTE — Assessment & Plan Note (Signed)
She showed a clear pattern of response to CRT and I think her recent heart decompensation was a clear consequence of iatrogenic hypervolemia. No permanent changes are made to device programming today. Biventricular pacing efficiency is 98%. All the lead parameters are excellent.

## 2013-03-19 NOTE — Progress Notes (Signed)
Patient ID: [name], female   DOB: [birth year], 63 y.o.   MRN: [id number]      Reason for office visit Shortness of breath, CAD, CHF, biventricular PM  [name] was recently hospitalized for small bowel obstruction. She was given intravenous fluids but did not require NG suction. Eventually her abdominal pain improved and she resumed normal bowel transit without needing surgery. It was felt that her symptoms are secondary to adhesions from multiple previous abdominal surgeries.  She developed marked shortness of breath following that brief hospital stay, although this appears to be slightly improving now. Her weight today is only slightly higher than at her last appointment and 3 pounds higher than it was in October. Roughly half a year ago she underwent multivessel coronary bypass surgery and had congestive heart failure. This improved after implantation of a CRT pacemaker in November. The thoracic impedance monitor (Corvue) feature of that device has only recently become active and shows a clear-cut pattern of recent hypervolemia with a deep reduction in thoracic impedance over the weekend, but is already showing signs of improvement. Glycemic control has also been a challenge recently.    No Known Allergies  Current Outpatient Prescriptions  Medication Sig Dispense Refill  . aspirin 81 MG tablet Take 1 tablet (81 mg total) by mouth daily.  30 tablet    . atorvastatin (LIPITOR) 80 MG tablet Take 1 tablet (80 mg total) by mouth daily.  90 tablet  3  . carvedilol (COREG) 12.5 MG tablet Take 12.5 mg by mouth 2 (two) times daily with a meal.      . furosemide (LASIX) 40 MG tablet Take 40 mg by mouth daily.      Marland Kitchen glipiZIDE (GLUCOTROL) 5 MG tablet Take 1 tablet (5 mg total) by mouth 2 (two) times daily before a meal.  60 tablet  3  . glucose blood (ONETOUCH VERIO) test strip Check 2 times daily. Use as instructed  200 each  3  . metFORMIN (GLUCOPHAGE) 1000 MG tablet Take 1 tablet (1,000 mg  total) by mouth 2 (two) times daily with a meal.  60 tablet  11  . nitroGLYCERIN (NITROSTAT) 0.4 MG SL tablet Place 1 tablet (0.4 mg total) under the tongue every 5 (five) minutes as needed for chest pain.  25 tablet  3  . Rivaroxaban (XARELTO) 20 MG TABS tablet Take 1 tablet (20 mg total) by mouth daily with supper.  30 tablet  0  . losartan (COZAAR) 25 MG tablet Take 1 tablet (25 mg total) by mouth 2 (two) times daily.  180 tablet  3   No current facility-administered medications for this visit.    Past Medical History  Diagnosis Date  . Diabetes mellitus type II   . Hyperlipidemia   . Hypertension   . Allergic rhinitis   . Cataract   . CAD (coronary artery disease)     a. 2004: s/p MI in [place]. No PCI->Medical RX;  b. 07/2012 Cath: LM 30-40, LAD 70p, 70/26m, D1 80-90p, OM1 small 90p, OM2 large 80-90p, 16m, 70-80d, RCA 20-30 diff, EF 40%, glob HK.s/p CABG  . Ischemic cardiomyopathy     a. 07/2012 Echo: EF 35%, Sev inferoseptal HK, mildly dil LA, Peak PASP .  Marland Kitchen PAF (paroxysmal atrial fibrillation)     a. 07/2012: Amio and xarelto initiated.  Marland Kitchen LBBB (left bundle branch block)     a. intermittent - present during rapid afib 07/2012.  . Breast cancer 2002    Patient reports  left breast cancer diagnosis in 2002 treated with bilateral mastectomy positive lymph nodes with left axillary dissection followed by chemotherapy of unknown type  . Neuromuscular disorder     Patient reports chronic numbness in the right foot related to previous surgery on the right leg and "nerve damage"  . Shortness of breath   . Biventricular cardiac pacemaker - St Jude, Nov 2014 03/04/2013    Past Surgical History  Procedure Laterality Date  . Mastectomy  2002    bilateral  . Tubal ligation  1987  . Bladder surgery  2000  . Right leg surgery  2001    torn ligaments and tendons x4 surgeries  . Appendectomy  1974  . Cholecystectomy  1974  . Abdominal hysterectomy  2000  . Breast surgery       Bilateral mastectomy for left breast cancer unknown stage but patient reports positive nodes was treated with chemotherapy  . Abdominal hysterectomy    . Coronary artery bypass graft N/A 09/28/2012    Procedure: CORONARY ARTERY BYPASS GRAFTING (CABG);  Surgeon: Delight Ovens, MD;  Location: [facility] OR;  Service: Open Heart Surgery;  Laterality: N/A;  CABG x four, using left internal mammary artery and left leg greater saphenous vein harvested endoscopically  . Maze N/A 09/28/2012    Procedure: MAZE;  Surgeon: Delight Ovens, MD;  Location: [facility] OR;  Service: Open Heart Surgery;  Laterality: N/A;  . Intraoperative transesophageal echocardiogram N/A 09/28/2012    Procedure: INTRAOPERATIVE TRANSESOPHAGEAL ECHOCARDIOGRAM;  Surgeon: Delight Ovens, MD;  Location: [facility] OR;  Service: Open Heart Surgery;  Laterality: N/A;  . Epicardial pacing lead placement N/A 09/28/2012    Procedure: EPICARDIAL PACING LEAD PLACEMENT;  Surgeon: Delight Ovens, MD;  Location: [facility] OR;  Service: Thoracic;  Laterality: N/A;  LV LEAD PLACEMENT  . Bi-ventricular pacemaker insertion (crt-p)  01/25/2013    Dr Ladona Ridgel ([facility])    Family History  Problem Relation Age of Onset  . Kidney failure Mother   . Heart disease Mother     Died mid 92s complications of diabetes  . Arthritis Other   . Cancer Other     colon, ovarian  . Diabetes Other   . Hyperlipidemia Other   . Kidney failure Other   . Colon cancer Sister     History   Social History  . Marital Status: Married    Spouse Name: N/A    Number of Children: 3  . Years of Education: N/A   Occupational History  . Office work    Social History Main Topics  . Smoking status: Former Smoker -- 1.00 packs/day for 10 years    Types: Cigarettes    Quit date: 03/11/2012  . Smokeless tobacco: Never Used     Comment: Quit in January  . Alcohol Use: No  . Drug Use: No  . Sexual Activity: Yes    Partners: Female   Other Topics Concern  . Not on file   Social  History Narrative   Lives with husband and mother in law.     Regular exercise: walking   Caffeine use: 2 cups of coffee in the morning    Review of systems: The patient specifically denies any chest pain at rest or with exertion,  orthopnea, paroxysmal nocturnal dyspnea, syncope, palpitations, focal neurological deficits, intermittent claudication, lower extremity edema, unexplained weight gain, cough, hemoptysis or wheezing.  The patient also denies abdominal pain, nausea, vomiting, dysphagia, diarrhea, constipation, polyuria, polydipsia, dysuria, hematuria, frequency, urgency, abnormal  bleeding or bruising, fever, chills, unexpected weight changes, mood swings, change in skin or hair texture, change in voice quality, auditory or visual problems, allergic reactions or rashes, new musculoskeletal complaints other than usual "aches and pains".   PHYSICAL EXAM BP 146/86  Pulse 76  Ht 5' 4.5" (1.638 m)  Wt 153 lb 6.4 oz (69.582 kg)  BMI 25.93 kg/m2 General: Alert, oriented x3, no distress  Head: no evidence of trauma, PERRL, EOMI, no exophtalmos or lid lag, no myxedema, no xanthelasma; normal ears, nose and oropharynx  Neck: normal jugular venous pulsations and no hepatojugular reflux; brisk carotid pulses without delay and no carotid bruits  Chest: clear to auscultation, no signs of consolidation by percussion or palpation, normal fremitus, symmetrical and full respiratory excursions  Cardiovascular: normal position and quality of the apical impulse, regular rhythm, normal first and second heart sounds, no murmurs, rubs, but she seems have a faint gallop (S3)  Abdomen: no tenderness or distention, no masses by palpation, no abnormal pulsatility or arterial bruits, normal bowel sounds, no hepatosplenomegaly  Extremities: no clubbing, cyanosis or edema; 2+ radial, ulnar and brachial pulses bilaterally; 2+ right femoral, posterior tibial and dorsalis pedis pulses; 2+ left femoral, posterior  tibial and dorsalis pedis pulses; no subclavian or femoral bruits  Neurological: grossly nonfocal   EKG: Atrial sense, biventricular paced with a remarkably narrow paced QRS duration of 106 ms  Lipid Panel     Component Value Date/Time   CHOL 144 07/18/2012 0500   TRIG 530* 07/18/2012 0500   HDL 39* 07/18/2012 0500   CHOLHDL 3.7 07/18/2012 0500   VLDL UNABLE TO CALCULATE IF TRIGLYCERIDE OVER 400 mg/dL 07/27/8414 6063   LDLCALC UNABLE TO CALCULATE IF TRIGLYCERIDE OVER 400 mg/dL 0/16/0109 3235    BMET    Component Value Date/Time   NA 141 03/15/2013 0510   K 3.5* 03/15/2013 0510   CL 105 03/15/2013 0510   CO2 24 03/15/2013 0510   GLUCOSE 180* 03/15/2013 0510   BUN 7 03/15/2013 0510   CREATININE 0.45* 03/15/2013 0510   CREATININE 0.72 11/25/2012 1628   CALCIUM 8.2* 03/15/2013 0510   GFRNONAA >90 03/15/2013 0510   GFRAA >90 03/15/2013 0510     ASSESSMENT AND PLAN S/P CABG x 4: 09/28/12 (LIMA-LAD, SVG-OM1-OM2, SVG-Intermediate) No signs or symptoms of acute coronary insufficiency  Acute on chronic combined systolic and diastolic CHF, NYHA class 3 Her decompensation is secondary to the administration of intravenous fluids and temporary cessation of furosemide therapy during her small bowel obstruction hospitalization. Have advised her to double up her dose of torsemide for the next 2 days. Her blood pressure is considerably higher today than it has been over the last few months. We will increase her losartan to 25 mg twice a day and plan to increase this further to a total of 100 mg a day if her blood pressure allows that her followup appointment.  PAF (paroxysmal atrial fibrillation) Since her pacemaker was implanted in November there is no evidence of recurrence. She should remain on Xarelto for the foreseeable future.   Biventricular cardiac pacemaker - St Jude, Nov 2014 She showed a clear pattern of response to CRT and I think her recent heart decompensation was a clear consequence of iatrogenic  hypervolemia. No permanent changes are made to device programming today. Biventricular pacing efficiency is 98%. All the lead parameters are excellent.   Patient Instructions  Please go home and set up your Merlin transmitter then all the office to confirm that  the transmission was received.  Your physician has recommended you make the following change in your medication: Take Lasix 80 mg daily x 2 days and start Losartan 25 mg twice daily.  Your physician recommends that you weigh, daily, at the same time every day, and in the same amount of clothing. Please record your daily weights on the handout provided and bring it to your next appointment.  Keep February appointment as scheduled.         Orders Placed This Encounter  Procedures  . EKG 12-Lead   Meds ordered this encounter  Medications  . losartan (COZAAR) 25 MG tablet    Sig: Take 1 tablet (25 mg total) by mouth 2 (two) times daily.    Dispense:  180 tablet    Refill:  3    Marra Fraga  Thurmon Fair, MD, [facility] [phone] office [phone] pager

## 2013-03-19 NOTE — Assessment & Plan Note (Signed)
Her decompensation is secondary to the administration of intravenous fluids and temporary cessation of furosemide therapy during her small bowel obstruction hospitalization. Have advised her to double up her dose of torsemide for the next 2 days. Her blood pressure is considerably higher today than it has been over the last few months. We will increase her losartan to 25 mg twice a day and plan to increase this further to a total of 100 mg a day if her blood pressure allows that her followup appointment.

## 2013-03-19 NOTE — Assessment & Plan Note (Signed)
Since her pacemaker was implanted in November there is no evidence of recurrence. She should remain on Xarelto for the foreseeable future.

## 2013-03-19 NOTE — Assessment & Plan Note (Signed)
No signs or symptoms of acute coronary insufficiency

## 2013-03-19 NOTE — Telephone Encounter (Signed)
LM informing patient that [facility] was received.

## 2013-03-22 ENCOUNTER — Encounter: Payer: Self-pay | Admitting: Family Medicine

## 2013-03-22 ENCOUNTER — Ambulatory Visit (INDEPENDENT_AMBULATORY_CARE_PROVIDER_SITE_OTHER): Payer: BC Managed Care – PPO | Admitting: Family Medicine

## 2013-03-22 VITALS — BP 120/80 | Temp 98.4°F | Wt 152.0 lb

## 2013-03-22 DIAGNOSIS — K56609 Unspecified intestinal obstruction, unspecified as to partial versus complete obstruction: Secondary | ICD-10-CM

## 2013-03-22 NOTE — Progress Notes (Signed)
Pre visit review using our clinic review tool, if applicable. No additional management support is needed unless otherwise documented below in the visit note. 

## 2013-03-22 NOTE — Progress Notes (Signed)
   Subjective:    Patient ID: [name], female    DOB: February 25, 1951, 63 y.o.   MRN: [id number]  HPI [name] is a 63 year old female nonsmoker who comes in today following a hospitalization January 4 2 January 5 for partial small bowel production.  She says she felt well until January 3 when she developed some abdominal bloating then pain then nausea and vomiting. She went emerge from the subsequently submitted the hospital. CT scan shows a partial SBO right lower quadrant. Also adjacent to that there is a hernia.  She was treated symptomatically with bedrest n.p.o. on IV fluids and after 24 hours her pain began to subside. She was therefore discharged on 15 to be followed as an outpatient  Since her discharge from the hospital she's had 3 episodes of abdominal bloating pain but then the pain stopped the bloating went away. She normally has 3 bowel movements a day since her heart surgery in July she's had trouble with constipation.  She's had a TAH and BSO gallbladder removal and appendectomy and a TRAM flap in the past   Review of Systems Review of systems otherwise negative    Objective:   Physical Exam  Well-developed well-nourished female in acute distress vital signs stable she is afebrile examination the abdomen shows he has flat scars from previous surgeries noted above. Liver spleen kidneys not enlarged no palpable masses. There is a defect in the right lower quadrant consistent with a hernia      Assessment & Plan:  Partial small bowel structure resolve now with recurrent pain and bloating.........Marland Kitchen plan see orders

## 2013-03-22 NOTE — Patient Instructions (Signed)
Drink lots of liquids  Milk of magnesia............ 3 tablespoons twice daily  MiraLax,,,,,,,,,,, 1 daily  If this combination does not produce 2-3 bowel movements daily and increase the milk of magnesia as outlined.  If despite doing the above he still have episodes of abdominal bloating that would reconsult with the general surgeons

## 2013-03-31 ENCOUNTER — Emergency Department (HOSPITAL_COMMUNITY)
Admission: EM | Admit: 2013-03-31 | Discharge: 2013-03-31 | Discharge status: Against Medical Advice | Payer: BC Managed Care – PPO | Attending: Emergency Medicine | Admitting: Emergency Medicine

## 2013-03-31 ENCOUNTER — Encounter (HOSPITAL_COMMUNITY): Payer: Self-pay | Admitting: Emergency Medicine

## 2013-03-31 DIAGNOSIS — R1084 Generalized abdominal pain: Secondary | ICD-10-CM | POA: Insufficient documentation

## 2013-03-31 DIAGNOSIS — I2510 Atherosclerotic heart disease of native coronary artery without angina pectoris: Secondary | ICD-10-CM | POA: Insufficient documentation

## 2013-03-31 DIAGNOSIS — K5900 Constipation, unspecified: Secondary | ICD-10-CM | POA: Insufficient documentation

## 2013-03-31 DIAGNOSIS — I10 Essential (primary) hypertension: Secondary | ICD-10-CM | POA: Insufficient documentation

## 2013-03-31 DIAGNOSIS — E119 Type 2 diabetes mellitus without complications: Secondary | ICD-10-CM | POA: Insufficient documentation

## 2013-03-31 DIAGNOSIS — Z951 Presence of aortocoronary bypass graft: Secondary | ICD-10-CM | POA: Insufficient documentation

## 2013-03-31 DIAGNOSIS — M545 Low back pain, unspecified: Secondary | ICD-10-CM | POA: Insufficient documentation

## 2013-03-31 HISTORY — DX: Unspecified intestinal obstruction, unspecified as to partial versus complete obstruction: K56.609

## 2013-03-31 LAB — COMPREHENSIVE METABOLIC PANEL
ALT: 19 U/L (ref 0–35)
AST: 15 U/L (ref 0–37)
Albumin: 4.6 g/dL (ref 3.5–5.2)
Alkaline Phosphatase: 61 U/L (ref 39–117)
BUN: 17 mg/dL (ref 6–23)
CALCIUM: 9.8 mg/dL (ref 8.4–10.5)
CO2: 27 mEq/L (ref 19–32)
CREATININE: 0.7 mg/dL (ref 0.50–1.10)
Chloride: 95 mEq/L — ABNORMAL LOW (ref 96–112)
GLUCOSE: 292 mg/dL — AB (ref 70–99)
Potassium: 4.9 mEq/L (ref 3.7–5.3)
Sodium: 138 mEq/L (ref 137–147)
Total Bilirubin: 0.7 mg/dL (ref 0.3–1.2)
Total Protein: 7.7 g/dL (ref 6.0–8.3)

## 2013-03-31 LAB — CBC WITH DIFFERENTIAL/PLATELET
Basophils Absolute: 0.1 10*3/uL (ref 0.0–0.1)
Basophils Relative: 1 % (ref 0–1)
EOS ABS: 0.1 10*3/uL (ref 0.0–0.7)
EOS PCT: 1 % (ref 0–5)
HEMATOCRIT: 36 % (ref 36.0–46.0)
Hemoglobin: 12.3 g/dL (ref 12.0–15.0)
LYMPHS ABS: 1.9 10*3/uL (ref 0.7–4.0)
Lymphocytes Relative: 12 % (ref 12–46)
MCH: 30.8 pg (ref 26.0–34.0)
MCHC: 34.2 g/dL (ref 30.0–36.0)
MCV: 90 fL (ref 78.0–100.0)
Monocytes Absolute: 0.4 10*3/uL (ref 0.1–1.0)
Monocytes Relative: 3 % (ref 3–12)
Neutro Abs: 12.8 10*3/uL — ABNORMAL HIGH (ref 1.7–7.7)
Neutrophils Relative %: 84 % — ABNORMAL HIGH (ref 43–77)
PLATELETS: 292 10*3/uL (ref 150–400)
RBC: 4 MIL/uL (ref 3.87–5.11)
RDW: 12.9 % (ref 11.5–15.5)
WBC: 15.3 10*3/uL — ABNORMAL HIGH (ref 4.0–10.5)

## 2013-03-31 LAB — LIPASE, BLOOD: Lipase: 34 U/L (ref 11–59)

## 2013-03-31 NOTE — ED Notes (Signed)
Pt. reports generalized abdominal pain with constipation and low back pain , pt. stated history of SBO .

## 2013-03-31 NOTE — ED Notes (Signed)
Pt st's she took MOM before coming to ED.  St's now she feels better and is going to follow up with her MD tomorrow.  Explained to her that she may need further test and should stay.  Pt again st's pain has subsided and she will follow up tomorrow.  Instructed her to return if needed.  Pt voices understanding.

## 2013-04-13 ENCOUNTER — Encounter: Payer: Self-pay | Admitting: Family Medicine

## 2013-04-13 ENCOUNTER — Telehealth: Payer: Self-pay | Admitting: *Deleted

## 2013-04-13 DIAGNOSIS — E1065 Type 1 diabetes mellitus with hyperglycemia: Secondary | ICD-10-CM

## 2013-04-13 DIAGNOSIS — IMO0002 Reserved for concepts with insufficient information to code with codable children: Secondary | ICD-10-CM

## 2013-04-13 NOTE — Telephone Encounter (Signed)
Patient is requesting lab results from 03/31/13.  Please advise?

## 2013-04-14 ENCOUNTER — Encounter: Payer: Self-pay | Admitting: *Deleted

## 2013-04-14 NOTE — Telephone Encounter (Signed)
Message sent to MyChart.

## 2013-04-24 ENCOUNTER — Encounter: Payer: Self-pay | Admitting: Cardiovascular Disease

## 2013-04-26 ENCOUNTER — Telehealth: Payer: Self-pay | Admitting: *Deleted

## 2013-04-26 NOTE — Telephone Encounter (Signed)
Patient requesting samples of Xarelto.  Patient notified we do have samples 20mg  for pick-up.  Lot #[id number]  EXP# 7/17.

## 2013-04-27 ENCOUNTER — Encounter: Payer: BC Managed Care – PPO | Admitting: Cardiovascular Disease

## 2013-05-03 ENCOUNTER — Encounter: Payer: Self-pay | Admitting: Family Medicine

## 2013-05-03 ENCOUNTER — Encounter: Payer: BC Managed Care – PPO | Admitting: Cardiovascular Disease

## 2013-05-13 ENCOUNTER — Encounter: Payer: Self-pay | Admitting: Family Medicine

## 2013-05-13 ENCOUNTER — Ambulatory Visit (INDEPENDENT_AMBULATORY_CARE_PROVIDER_SITE_OTHER): Payer: BC Managed Care – PPO | Admitting: Family Medicine

## 2013-05-13 VITALS — BP 120/80 | Temp 98.2°F | Wt 154.0 lb

## 2013-05-13 DIAGNOSIS — F411 Generalized anxiety disorder: Secondary | ICD-10-CM

## 2013-05-13 DIAGNOSIS — IMO0002 Reserved for concepts with insufficient information to code with codable children: Secondary | ICD-10-CM

## 2013-05-13 DIAGNOSIS — E1065 Type 1 diabetes mellitus with hyperglycemia: Secondary | ICD-10-CM

## 2013-05-13 DIAGNOSIS — D649 Anemia, unspecified: Secondary | ICD-10-CM | POA: Insufficient documentation

## 2013-05-13 HISTORY — DX: Generalized anxiety disorder: F41.1

## 2013-05-13 LAB — BASIC METABOLIC PANEL
BUN: 18 mg/dL (ref 6–23)
CALCIUM: 9.8 mg/dL (ref 8.4–10.5)
CO2: 28 mEq/L (ref 19–32)
Chloride: 99 mEq/L (ref 96–112)
Creatinine, Ser: 0.7 mg/dL (ref 0.4–1.2)
GFR: 93.43 mL/min (ref >60.00)
Glucose, Bld: 115 mg/dL — ABNORMAL HIGH (ref 70–99)
POTASSIUM: 4.5 meq/L (ref 3.5–5.1)
SODIUM: 135 meq/L (ref 135–145)

## 2013-05-13 LAB — CBC WITH DIFFERENTIAL/PLATELET
BASOS ABS: 0 10*3/uL (ref 0.0–0.1)
Basophils Relative: 0.3 % (ref 0.0–3.0)
EOS PCT: 2 % (ref 0.0–5.0)
Eosinophils Absolute: 0.1 10*3/uL (ref 0.0–0.7)
HCT: 35.1 % — ABNORMAL LOW (ref 36.0–46.0)
Hemoglobin: 11.9 g/dL — ABNORMAL LOW (ref 12.0–15.0)
LYMPHS PCT: 30.1 % (ref 12.0–46.0)
Lymphs Abs: 2.3 10*3/uL (ref 0.7–4.0)
MCHC: 33.8 g/dL (ref 30.0–36.0)
MCV: 91.3 fl (ref 78.0–100.0)
MONOS PCT: 5.2 % (ref 3.0–12.0)
Monocytes Absolute: 0.4 10*3/uL (ref 0.1–1.0)
NEUTROS PCT: 62.4 % (ref 43.0–77.0)
Neutro Abs: 4.7 10*3/uL (ref 1.4–7.7)
PLATELETS: 252 10*3/uL (ref 150.0–400.0)
RBC: 3.85 Mil/uL — ABNORMAL LOW (ref 3.87–5.11)
RDW: 13.5 % (ref 11.5–14.6)
WBC: 7.5 10*3/uL (ref 4.5–10.5)

## 2013-05-13 LAB — MICROALBUMIN / CREATININE URINE RATIO
CREATININE, U: 72.3 mg/dL
MICROALB UR: 0.8 mg/dL (ref 0.0–1.9)
MICROALB/CREAT RATIO: 1.1 mg/g (ref 0.0–30.0)

## 2013-05-13 LAB — HEMOGLOBIN A1C: HEMOGLOBIN A1C: 8 % — AB (ref 4.6–6.5)

## 2013-05-13 MED ORDER — GLIPIZIDE 10 MG PO TABS: 10.0000 mg | ORAL_TABLET | Freq: Two times a day (BID) | ORAL | Status: DC

## 2013-05-13 NOTE — Progress Notes (Signed)
Pre visit review using our clinic review tool, if applicable. No additional management support is needed unless otherwise documented below in the visit note. 

## 2013-05-13 NOTE — Progress Notes (Signed)
   Subjective:    Patient ID: [name], female    DOB: [birth year], 63 y.o.   MRN: [id number]  HPI [name] is a 63 year old married female nonsmoker who comes in today for 3 issues  She's on metformin 1000 mg twice a day and glipizide 5 mg twice a day however blood sugars at 200-250 range. Weight steady 154 pounds   she's also had issues with stress and anxiety. There is a lot of family things she's had a history of anemia etiology unknown. Her hemoglobin a month ago was 10.8 followup was 12.32 and half weeks later. She's not taken any iron supplementsgoing on. Refer to [facility]     review of systems otherwise negative Objective:   Physical Exam   well-developed well-nourished female no acute distress vital signs stable she's afebrile       Assessment & Plan:   diabetes type 2 not at goal ,,,,,,, see orders  Anxiety ,,,,,,,,, consult with Richardo Priest  ,, Anemia ,,,,,,,,, check labs

## 2013-05-13 NOTE — Patient Instructions (Signed)
Increase the glipizide to 10 mg twice daily  Continue the metformin 1000 mg twice daily   Walk 30 minutes daily  Really pay attention to your diet  Call and get a time to see Richardo Priest at  I will call you I gets her lab work back  Return in one month for followup

## 2013-05-14 ENCOUNTER — Telehealth: Payer: Self-pay

## 2013-05-14 NOTE — Telephone Encounter (Signed)
Relevant patient education assigned to patient using Emmi. ° °

## 2013-05-17 ENCOUNTER — Other Ambulatory Visit: Payer: Self-pay | Admitting: Family Medicine

## 2013-05-17 DIAGNOSIS — E1065 Type 1 diabetes mellitus with hyperglycemia: Secondary | ICD-10-CM

## 2013-05-17 DIAGNOSIS — IMO0002 Reserved for concepts with insufficient information to code with codable children: Secondary | ICD-10-CM

## 2013-05-18 ENCOUNTER — Encounter: Payer: Self-pay | Admitting: Cardiovascular Disease

## 2013-05-20 ENCOUNTER — Ambulatory Visit (INDEPENDENT_AMBULATORY_CARE_PROVIDER_SITE_OTHER): Payer: BC Managed Care – PPO | Admitting: Cardiovascular Disease

## 2013-05-20 VITALS — BP 132/70 | HR 80 | Resp 16 | Ht 64.0 in | Wt 154.0 lb

## 2013-05-20 DIAGNOSIS — I2510 Atherosclerotic heart disease of native coronary artery without angina pectoris: Secondary | ICD-10-CM

## 2013-05-20 DIAGNOSIS — I480 Paroxysmal atrial fibrillation: Secondary | ICD-10-CM

## 2013-05-20 DIAGNOSIS — I255 Ischemic cardiomyopathy: Secondary | ICD-10-CM

## 2013-05-20 DIAGNOSIS — I5042 Chronic combined systolic (congestive) and diastolic (congestive) heart failure: Secondary | ICD-10-CM

## 2013-05-20 DIAGNOSIS — I4891 Unspecified atrial fibrillation: Secondary | ICD-10-CM

## 2013-05-20 DIAGNOSIS — I509 Heart failure, unspecified: Secondary | ICD-10-CM

## 2013-05-20 DIAGNOSIS — Z950 Presence of cardiac pacemaker: Secondary | ICD-10-CM

## 2013-05-20 DIAGNOSIS — I2589 Other forms of chronic ischemic heart disease: Secondary | ICD-10-CM

## 2013-05-20 LAB — MDC_IDC_ENUM_SESS_TYPE_INCLINIC
Battery Voltage: 2.99 V
Brady Statistic RA Percent Paced: 1 % — CL
Brady Statistic RV Percent Paced: 95 %
Implantable Pulse Generator Model: 3222
Lead Channel Impedance Value: 400 Ohm
Lead Channel Pacing Threshold Amplitude: 1 V
Lead Channel Pacing Threshold Amplitude: 1 V
Lead Channel Pacing Threshold Amplitude: 1.75 V
Lead Channel Sensing Intrinsic Amplitude: 12 mV
Lead Channel Setting Pacing Amplitude: 2 V
Lead Channel Setting Pacing Amplitude: 2.25 V
Lead Channel Setting Pacing Pulse Width: 0.4 ms
MDC IDC MSMT LEADCHNL LV IMPEDANCE VALUE: 380 Ohm
MDC IDC MSMT LEADCHNL LV PACING THRESHOLD PULSEWIDTH: 0.5 ms
MDC IDC MSMT LEADCHNL RA PACING THRESHOLD PULSEWIDTH: 0.4 ms
MDC IDC MSMT LEADCHNL RA SENSING INTR AMPL: 1 mV
MDC IDC MSMT LEADCHNL RV IMPEDANCE VALUE: 530 Ohm
MDC IDC MSMT LEADCHNL RV PACING THRESHOLD PULSEWIDTH: 0.4 ms
MDC IDC PG SERIAL: 2981305
MDC IDC SET LEADCHNL LV PACING PULSEWIDTH: 0.5 ms
MDC IDC SET LEADCHNL RV PACING AMPLITUDE: 2.5 V
MDC IDC SET LEADCHNL RV SENSING SENSITIVITY: 2 mV

## 2013-05-20 LAB — PACEMAKER DEVICE OBSERVATION

## 2013-05-20 MED ORDER — CARVEDILOL 25 MG PO TABS: 25.0000 mg | ORAL_TABLET | Freq: Two times a day (BID) | ORAL | Status: DC

## 2013-05-20 NOTE — Patient Instructions (Signed)
INCREASE Carvedilol to 25mg  twice a day.  A new prescription has been sent to your pharmacy.  Your physician recommends that you schedule a follow-up appointment in: 3 months with Dr. Sallyanne Kuster.

## 2013-05-23 ENCOUNTER — Encounter: Payer: Self-pay | Admitting: Cardiovascular Disease

## 2013-05-23 NOTE — Assessment & Plan Note (Signed)
She appears to be euvolemic. We'll take advantage of this to try to increase her carvedilol further to 25 mg twice a day. This should help with suppression of PVCs and optimization of biventricular pacing. I warned her that she may feel a little worse for the next week or 2 if things should balance himself stopped subsequently. She should call if she develops significant shortness of breath, edema or dizziness.

## 2013-05-23 NOTE — Progress Notes (Signed)
Patient ID: [name], female   DOB: 1950-08-21, 63 y.o.   MRN: [id number]      Reason for office visit CAD, CHF, pacemaker check  [name] is generally feeling well although she has NYHA class II dyspnea on exertion and does complain of fatigue. Interrogation of her biventricular pacemaker shows normal device function with some loss of by the pacing efficiency due to frequent PVCs. Lead parameters remain excellent. Corvue thoracic impedance monitoring shows no recent problems with volume shifts.   She complains of frustration dealing with her mother-in-law who is also seriously ill. In addition she has problems related to partial small bowel obstruction, the symptoms of which recur every now and then, although not as severely as during her hospitalization in early January  No Known Allergies  Current Outpatient Prescriptions  Medication Sig Dispense Refill  . aspirin 81 MG tablet Take 1 tablet (81 mg total) by mouth daily.  30 tablet    . atorvastatin (LIPITOR) 80 MG tablet Take 1 tablet (80 mg total) by mouth daily.  90 tablet  3  . furosemide (LASIX) 40 MG tablet Take 40 mg by mouth daily.      Marland Kitchen glipiZIDE (GLUCOTROL) 10 MG tablet Take 1 tablet (10 mg total) by mouth 2 (two) times daily before a meal.  200 tablet  3  . glucose blood (ONETOUCH VERIO) test strip Check 2 times daily. Use as instructed  200 each  3  . losartan (COZAAR) 25 MG tablet Take 1 tablet (25 mg total) by mouth 2 (two) times daily.  180 tablet  3  . metFORMIN (GLUCOPHAGE) 1000 MG tablet Take 1 tablet (1,000 mg total) by mouth 2 (two) times daily with a meal.  60 tablet  11  . nitroGLYCERIN (NITROSTAT) 0.4 MG SL tablet Place 1 tablet (0.4 mg total) under the tongue every 5 (five) minutes as needed for chest pain.  25 tablet  3  . Rivaroxaban (XARELTO) 20 MG TABS tablet Take 1 tablet (20 mg total) by mouth daily with supper.  30 tablet  0  . carvedilol (COREG) 25 MG tablet Take 1 tablet (25 mg total) by mouth 2  (two) times daily.  60 tablet  6   No current facility-administered medications for this visit.    Past Medical History  Diagnosis Date  . Diabetes mellitus type II   . Hyperlipidemia   . Hypertension   . Allergic rhinitis   . Cataract   . CAD (coronary artery disease)     a. 2004: s/p MI in [place]. No PCI->Medical RX;  b. 07/2012 Cath: LM 30-40, LAD 70p, 70/34m, D1 80-90p, OM1 small 90p, OM2 large 80-90p, 55m, 70-80d, RCA 20-30 diff, EF 40%, glob HK.s/p CABG  . Ischemic cardiomyopathy     a. 07/2012 Echo: EF 35%, Sev inferoseptal HK, mildly dil LA, Peak PASP .  Marland Kitchen PAF (paroxysmal atrial fibrillation)     a. 07/2012: Amio and xarelto initiated.  Marland Kitchen LBBB (left bundle branch block)     a. intermittent - present during rapid afib 07/2012.  . Breast cancer 2002    Patient reports left breast cancer diagnosis in 2002 treated with bilateral mastectomy positive lymph nodes with left axillary dissection followed by chemotherapy of unknown type  . Neuromuscular disorder     Patient reports chronic numbness in the right foot related to previous surgery on the right leg and "nerve damage"  . Shortness of breath   . Biventricular cardiac pacemaker - [facility], Nov  2014 03/04/2013  . SBO (small bowel obstruction)     Past Surgical History  Procedure Laterality Date  . Mastectomy  2002    bilateral  . Tubal ligation  1987  . Bladder surgery  2000  . Right leg surgery  2001    torn ligaments and tendons x4 surgeries  . Appendectomy  1974  . Cholecystectomy  1974  . Abdominal hysterectomy  2000  . Breast surgery      Bilateral mastectomy for left breast cancer unknown stage but patient reports positive nodes was treated with chemotherapy  . Abdominal hysterectomy    . Coronary artery bypass graft N/A 09/28/2012    Procedure: CORONARY ARTERY BYPASS GRAFTING (CABG);  Surgeon: Delight Ovens, MD;  Location: [facility] OR;  Service: Open Heart Surgery;  Laterality: N/A;  CABG x four, using left  internal mammary artery and left leg greater saphenous vein harvested endoscopically  . Maze N/A 09/28/2012    Procedure: MAZE;  Surgeon: Delight Ovens, MD;  Location: [facility] OR;  Service: Open Heart Surgery;  Laterality: N/A;  . Intraoperative transesophageal echocardiogram N/A 09/28/2012    Procedure: INTRAOPERATIVE TRANSESOPHAGEAL ECHOCARDIOGRAM;  Surgeon: Delight Ovens, MD;  Location: [facility] OR;  Service: Open Heart Surgery;  Laterality: N/A;  . Epicardial pacing lead placement N/A 09/28/2012    Procedure: EPICARDIAL PACING LEAD PLACEMENT;  Surgeon: Delight Ovens, MD;  Location: [facility] OR;  Service: Thoracic;  Laterality: N/A;  LV LEAD PLACEMENT  . Bi-ventricular pacemaker insertion (crt-p)  01/25/2013    Dr Ladona Ridgel ([facility])    Family History  Problem Relation Age of Onset  . Kidney failure Mother   . Heart disease Mother     Died mid 24s complications of diabetes  . Arthritis Other   . Cancer Other     colon, ovarian  . Diabetes Other   . Hyperlipidemia Other   . Kidney failure Other   . Colon cancer Sister     History   Social History  . Marital Status: Married    Spouse Name: N/A    Number of Children: 3  . Years of Education: N/A   Occupational History  . Office work    Social History Main Topics  . Smoking status: Former Smoker -- 1.00 packs/day for 10 years    Types: Cigarettes    Quit date: 03/11/2012  . Smokeless tobacco: Never Used     Comment: Quit in January  . Alcohol Use: No  . Drug Use: No  . Sexual Activity: Yes    Partners: Female   Other Topics Concern  . Not on file   Social History Narrative   Lives with husband and mother in law.     Regular exercise: walking   Caffeine use: 2 cups of coffee in the morning    Review of systems: The patient specifically denies any chest pain at rest or with exertion, orthopnea, paroxysmal nocturnal dyspnea, syncope, palpitations, focal neurological deficits, intermittent claudication, lower extremity edema,  unexplained weight gain, cough, hemoptysis or wheezing.  The patient also denies abdominal pain, nausea, vomiting, dysphagia, diarrhea, constipation, polyuria, polydipsia, dysuria, hematuria, frequency, urgency, abnormal bleeding or bruising, fever, chills, unexpected weight changes, mood swings, change in skin or hair texture, change in voice quality, auditory or visual problems, allergic reactions or rashes, new musculoskeletal complaints other than usual "aches and pains".   PHYSICAL EXAM BP 132/70  Pulse 80  Resp 16  Ht 5\' 4"  (1.626 m)  Wt 69.854 kg (  154 lb)  BMI 26.42 kg/m2 General: Alert, oriented x3, no distress  Head: no evidence of trauma, PERRL, EOMI, no exophtalmos or lid lag, no myxedema, no xanthelasma; normal ears, nose and oropharynx  Neck: normal jugular venous pulsations and no hepatojugular reflux; brisk carotid pulses without delay and no carotid bruits  Chest: clear to auscultation, no signs of consolidation by percussion or palpation, normal fremitus, symmetrical and full respiratory excursions  Cardiovascular: normal position and quality of the apical impulse, regular rhythm, normal first and splitsecond heart sounds, no murmurs, rubs, or gallop Abdomen: no tenderness or distention, no masses by palpation, no abnormal pulsatility or arterial bruits, normal bowel sounds, no hepatosplenomegaly  Extremities: no clubbing, cyanosis or edema; 2+ radial, ulnar and brachial pulses bilaterally; 2+ right femoral, posterior tibial and dorsalis pedis pulses; 2+ left femoral, posterior tibial and dorsalis pedis pulses; no subclavian or femoral bruits  Neurological: grossly nonfocal   EKG: AsBivP  Lipid Panel     Component Value Date/Time   CHOL 144 07/18/2012 0500   TRIG 530* 07/18/2012 0500   HDL 39* 07/18/2012 0500   CHOLHDL 3.7 07/18/2012 0500   VLDL UNABLE TO CALCULATE IF TRIGLYCERIDE OVER 400 mg/dL 04/30/2540 7062   LDLCALC UNABLE TO CALCULATE IF TRIGLYCERIDE OVER 400 mg/dL  3/76/2831 5176    BMET    Component Value Date/Time   NA 135 05/13/2013 1408   K 4.5 05/13/2013 1408   CL 99 05/13/2013 1408   CO2 28 05/13/2013 1408   GLUCOSE 115* 05/13/2013 1408   BUN 18 05/13/2013 1408   CREATININE 0.7 05/13/2013 1408   CREATININE 0.72 11/25/2012 1628   CALCIUM 9.8 05/13/2013 1408   GFRNONAA >90 03/31/2013 0145   GFRAA >90 03/31/2013 0145     ASSESSMENT AND PLAN Chronic combined systolic and diastolic CHF, NYHA class 2 She appears to be euvolemic. We'll take advantage of this to try to increase her carvedilol further to 25 mg twice a day. This should help with suppression of PVCs and optimization of biventricular pacing. I warned her that she may feel a little worse for the next week or 2 if things should balance himself stopped subsequently. She should call if she develops significant shortness of breath, edema or dizziness.  Biventricular cardiac pacemaker - [employer], Nov 2014 Normal device function  CAD (coronary artery disease) S/P CABG, MAZE and LA appendage clipping 09/2012.No angina.  Ischemic cardiomyopathy NYHA class II, euvolemic. Last EF assessment by echo October 2014 showed ejection fraction of 35-40%. This is probably mostly ischemic cardiomyopathy although cannot exclude a contribution of chemotherapy for breast cancer in the past.  PAF (paroxysmal atrial fibrillation) S/P CABG, MAZE and LA appendage clipping 09/2012. If she has a full year of absence of atrial fibrillation, may discontinue anticoagulation.   Patient Instructions  INCREASE Carvedilol to 25mg  twice a day.  A new prescription has been sent to your pharmacy.  Your physician recommends that you schedule a follow-up appointment in: 3 months with Dr. Royann Shivers.     Meds ordered this encounter  Medications  . carvedilol (COREG) 25 MG tablet    Sig: Take 1 tablet (25 mg total) by mouth 2 (two) times daily.    Dispense:  60 tablet    Refill:  6    Kawanna Christley  Thurmon Fair, MD,  [facility] [phone] office [phone] pager

## 2013-05-23 NOTE — Assessment & Plan Note (Signed)
NYHA class II, euvolemic. Last EF assessment by echo October 2014 showed ejection fraction of 35-40%. This is probably mostly ischemic cardiomyopathy although cannot exclude a contribution of chemotherapy for breast cancer in the past.

## 2013-05-23 NOTE — Assessment & Plan Note (Signed)
S/P CABG, MAZE and LA appendage clipping 09/2012. If she has a full year of absence of atrial fibrillation, may discontinue anticoagulation.

## 2013-05-23 NOTE — Assessment & Plan Note (Signed)
Normal device function.

## 2013-05-23 NOTE — Assessment & Plan Note (Addendum)
S/P CABG, MAZE and LA appendage clipping 09/2012.No angina.

## 2013-05-28 ENCOUNTER — Telehealth: Payer: Self-pay | Admitting: Family Medicine

## 2013-05-28 NOTE — Telephone Encounter (Signed)
Patient Information:  Caller Name: Melisse  Phone: [phone]  Patient: [name], [name]  Gender: Female  DOB: Nov 16, 1950  Age: 63 Years  PCP: Stevie Kern [facility])  Office Follow Up:  Does the office need to follow up with this patient?: No  Instructions For The Office: N/A  RN Note:  Onset of urgency, frequency, and spasm with voiding 05/27/13.  Afebrile.  Denies emergent symptoms per urination pain protocol; advised being seen within 24 hours due to "age > 80 years."  Appt scheduled 03/210/15 1045 in [place] office.  krs/can  Symptoms  Reason For Call & Symptoms: urinary symptoms  Reviewed Health History In EMR: Yes  Reviewed Medications In EMR: Yes  Reviewed Allergies In EMR: Yes  Reviewed Surgeries / Procedures: Yes  Date of Onset of Symptoms: 05/27/2013  Guideline(s) Used:  Urination Pain - Female  Disposition Per Guideline:   See Today in Office  Reason For Disposition Reached:   Age > 50 years  Advice Given:  N/A  Patient Will Follow Care Advice:  YES  Appointment Scheduled:  05/29/2013 10:45:00 Appointment Scheduled Provider:  N/A

## 2013-05-29 ENCOUNTER — Encounter: Payer: Self-pay | Admitting: Family Medicine

## 2013-05-29 ENCOUNTER — Ambulatory Visit (INDEPENDENT_AMBULATORY_CARE_PROVIDER_SITE_OTHER): Payer: BC Managed Care – PPO | Admitting: Family Medicine

## 2013-05-29 VITALS — BP 126/70 | HR 69 | Temp 98.4°F | Wt 154.0 lb

## 2013-05-29 DIAGNOSIS — R350 Frequency of micturition: Secondary | ICD-10-CM

## 2013-05-29 LAB — POCT URINALYSIS DIPSTICK
BILIRUBIN UA: NEGATIVE
GLUCOSE UA: NEGATIVE
Ketones, UA: NEGATIVE
Leukocytes, UA: NEGATIVE
Nitrite, UA: NEGATIVE
Protein, UA: NEGATIVE
Urobilinogen, UA: NEGATIVE

## 2013-05-29 MED ORDER — CEPHALEXIN 500 MG PO CAPS: 500.0000 mg | ORAL_CAPSULE | Freq: Two times a day (BID) | ORAL | Status: DC

## 2013-05-29 NOTE — Patient Instructions (Signed)
Very nice to meet you Hold on to the antibiotic but if you get worse start taking it.  Continue to watch your sugars.  Have a great trip!

## 2013-05-29 NOTE — Progress Notes (Signed)
SUBJECTIVE: [name] is a 63 y.o. female who complains of urinary frequency, urgency and dysuria x 3 days, without flank pain, fever, chills, or abnormal vaginal discharge or bleeding. Patient states that she had similar episode of this previously and went on for quite some time and then was treated for a urinary tract infection and it completely resolved. Patient is going out of town in 2 days soreness be evaluated before she left.  OBJECTIVE: Appears well, in no apparent distress.  Vital signs are normal. The abdomen is soft without tenderness, guarding, mass, rebound or organomegaly. No CVA tenderness or inguinal adenopathy noted. Urine dipstick shows positive for leukocytes and positive for ketones.  Micro exam: not done.   ASSESSMENT: UTI uncomplicated without evidence of pyelonephritis  PLAN: Treatment per orders - also push fluids, may use Pyridium OTC prn. Call or return to clinic prn if these symptoms worsen or fail to improve as anticipated.

## 2013-06-15 ENCOUNTER — Ambulatory Visit: Payer: BC Managed Care – PPO | Admitting: Family Medicine

## 2013-06-18 ENCOUNTER — Telehealth: Payer: Self-pay | Admitting: Family Medicine

## 2013-06-18 NOTE — Telephone Encounter (Signed)
Pt would like more samples of Rivaroxaban (XARELTO) 20 MG TABS tablet

## 2013-06-18 NOTE — Telephone Encounter (Signed)
Samples available for pick up and patient is aware. 

## 2013-07-28 ENCOUNTER — Telehealth: Payer: Self-pay | Admitting: Cardiovascular Disease

## 2013-07-29 NOTE — Telephone Encounter (Signed)
Closed encounter °

## 2013-08-03 ENCOUNTER — Telehealth: Payer: Self-pay | Admitting: Family Medicine

## 2013-08-03 NOTE — Telephone Encounter (Signed)
Samples available for pickup  

## 2013-08-03 NOTE — Telephone Encounter (Signed)
Pt wants to know if she can get some samples of Rivaroxaban (XARELTO) 20 MG TABS tablet.

## 2013-08-09 ENCOUNTER — Other Ambulatory Visit (INDEPENDENT_AMBULATORY_CARE_PROVIDER_SITE_OTHER): Payer: BC Managed Care – PPO

## 2013-08-09 DIAGNOSIS — E1065 Type 1 diabetes mellitus with hyperglycemia: Secondary | ICD-10-CM

## 2013-08-09 DIAGNOSIS — IMO0002 Reserved for concepts with insufficient information to code with codable children: Secondary | ICD-10-CM

## 2013-08-09 LAB — BASIC METABOLIC PANEL
BUN: 16 mg/dL (ref 6–23)
CHLORIDE: 99 meq/L (ref 96–112)
CO2: 28 mEq/L (ref 19–32)
Calcium: 9.6 mg/dL (ref 8.4–10.5)
Creatinine, Ser: 0.7 mg/dL (ref 0.4–1.2)
GFR: 88.82 mL/min (ref >60.00)
Glucose, Bld: 212 mg/dL — ABNORMAL HIGH (ref 70–99)
POTASSIUM: 4.5 meq/L (ref 3.5–5.1)
SODIUM: 137 meq/L (ref 135–145)

## 2013-08-09 LAB — MICROALBUMIN / CREATININE URINE RATIO
CREATININE, U: 70.5 mg/dL
MICROALB UR: 0.5 mg/dL (ref 0.0–1.9)
MICROALB/CREAT RATIO: 0.7 mg/g (ref 0.0–30.0)

## 2013-08-09 LAB — HEMOGLOBIN A1C: HEMOGLOBIN A1C: 9 % — AB (ref 4.6–6.5)

## 2013-08-11 ENCOUNTER — Other Ambulatory Visit: Payer: Self-pay | Admitting: Physician Assistant

## 2013-08-12 NOTE — Telephone Encounter (Signed)
Rx was sent to pharmacy electronically. 

## 2013-08-28 ENCOUNTER — Telehealth: Payer: Self-pay | Admitting: Cardiovascular Disease

## 2013-08-28 ENCOUNTER — Encounter: Payer: Self-pay | Admitting: Cardiovascular Disease

## 2013-09-08 ENCOUNTER — Emergency Department (INDEPENDENT_AMBULATORY_CARE_PROVIDER_SITE_OTHER): Payer: BC Managed Care – PPO

## 2013-09-08 ENCOUNTER — Emergency Department (HOSPITAL_COMMUNITY)
Admission: EM | Admit: 2013-09-08 | Discharge: 2013-09-08 | Disposition: A | Discharge status: Home / Self Care | Payer: BC Managed Care – PPO | Source: Home / Self Care | Attending: Emergency Medicine | Admitting: Emergency Medicine

## 2013-09-08 ENCOUNTER — Encounter (HOSPITAL_COMMUNITY): Payer: Self-pay | Admitting: Emergency Medicine

## 2013-09-08 DIAGNOSIS — Y93K1 Activity, walking an animal: Secondary | ICD-10-CM

## 2013-09-08 DIAGNOSIS — S92309A Fracture of unspecified metatarsal bone(s), unspecified foot, initial encounter for closed fracture: Secondary | ICD-10-CM

## 2013-09-08 DIAGNOSIS — X500XXA Overexertion from strenuous movement or load, initial encounter: Secondary | ICD-10-CM

## 2013-09-08 DIAGNOSIS — S92302A Fracture of unspecified metatarsal bone(s), left foot, initial encounter for closed fracture: Secondary | ICD-10-CM

## 2013-09-08 MED ORDER — HYDROCODONE-ACETAMINOPHEN 5-325 MG PO TABS: ORAL_TABLET | ORAL | Status: DC

## 2013-09-08 NOTE — ED Provider Notes (Signed)
  Chief Complaint   Chief Complaint  Patient presents with  . Foot Pain    History of Present Illness   [name] is a 63 year old female who was out walking her dog this past Sunday, 4 days ago, when her dog began to chase a squirrel, jerking her off her feet. She twisted her left foot, and has had swelling and pain over the foot since then. It hurts a little to ambulate. Maximum pain is over the fifth metatarsal.  Review of Systems   Other than as noted above, the patient denies any of the following symptoms: Systemic:  No fevers or chills. Musculoskeletal:  No joint pain or arthritis.  Neurological:  No muscular weakness, paresthesias.   [place]   Past medical history, family history, social history, meds, and allergies were reviewed.     Physical  Examination     Vital signs:  BP 140/80  Pulse 70  Temp(Src) 98.4 F (36.9 C) (Oral)  Resp 20  SpO2 100% Gen:  Alert and oriented times 3.  In no distress. Musculoskeletal:  Exam of the foot reveals she has swelling and slight bruising over the entire dorsum of the foot. There is pain to palpation particularly over the fifth metatarsal. No obvious deformity.  Otherwise, all joints had a full a ROM with no swelling, bruising or deformity.  No edema, pulses full. Extremities were warm and pink.  Capillary refill was brisk.  Skin:  Clear, warm and dry.  No rash. Neuro:  Alert and oriented times 3.  Muscle strength was normal.  Sensation was intact to light touch.    Radiology   Dg Foot Complete Left  09/08/2013   CLINICAL DATA:  Recent injury and pain  EXAM: LEFT FOOT - COMPLETE 3+ VIEW  COMPARISON:  None.  FINDINGS: Mild irregularity of the distal aspect of the fifth metatarsal is noted just proximal to the head. This likely represents an undisplaced fracture. No other fractures are seen.  IMPRESSION: Undisplaced fracture of the distal fifth metatarsal. Correlation to point tenderness is recommended.   Electronically Signed   By:  Inez Catalina M.D.   On: 09/08/2013 15:09   I reviewed the images independently and personally and concur with the radiologist's findings.  Course in Urgent [place] in a [place].  Assessment   The encounter diagnosis was Metatarsal fracture, left, closed, initial encounter.  This is really a minimal fracture and does not appear to be displaced.  Plan    1.  Meds:  The following meds were prescribed:   New Prescriptions   HYDROCODONE-ACETAMINOPHEN (NORCO/VICODIN) 5-325 MG PER TABLET    1 to 2 tabs every 4 to 6 hours as needed for pain.    2.  Patient Education/Counseling:  The patient was given appropriate handouts, self care instructions, and instructed in symptomatic relief including rest and activity, elevation, application of ice and compression.    3.  Follow up:  The patient was told to follow up here if no better in 3 to 4 days, or sooner if becoming worse in any way, and given some red flag symptoms such as worsening pain or neurological symptoms which would prompt immediate return.  Follow up with Dr. Wylene Simmer next week.       Harden Mo, MD 09/08/13 [phone]

## 2013-09-08 NOTE — Discharge Instructions (Signed)
Metatarsal Fracture, Undisplaced  A metatarsal fracture is a break in the bone(s) of the foot. These are the bones of the foot that connect your toes to the bones of the ankle.  DIAGNOSIS   The diagnoses of these fractures are usually made with X-rays. If there are problems in the forefoot and x-rays are normal a later bone scan will usually make the diagnosis.   TREATMENT AND HOME CARE INSTRUCTIONS  · Treatment may or may not include a cast or walking shoe. When casts are needed the use is usually for short periods of time so as not to slow down healing with muscle wasting (atrophy).  · Activities should be stopped until further advised by your caregiver.  · Wear shoes with adequate shock absorbing capabilities and stiff soles.  · Alternative exercise may be undertaken while waiting for healing. These may include bicycling and swimming, or as your caregiver suggests.  · It is important to keep all follow-up visits or specialty referrals. The failure to keep these appointments could result in improper bone healing and chronic pain or disability.  · Warning: Do not drive a car or operate a motor vehicle until your caregiver specifically tells you it is safe to do so.  IF YOU DO NOT HAVE A CAST OR SPLINT:  · You may walk on your injured foot as tolerated or advised.  · Do not put any weight on your injured foot for as long as directed by your caregiver. Slowly increase the amount of time you walk on the foot as the pain allows or as advised.  · Use crutches until you can bear weight without pain. A gradual increase in weight bearing may help.  · Apply ice to the injury for 15-20 minutes each hour while awake for the first 2 days. Put the ice in a plastic bag and place a towel between the bag of ice and your skin.  · Only take over-the-counter or prescription medicines for pain, discomfort, or fever as directed by your caregiver.  SEEK IMMEDIATE MEDICAL CARE IF:   · Your cast gets damaged or breaks.  · You have  continued severe pain or more swelling than you did before the cast was put on, or the pain is not controlled with medications.  · Your skin or nails below the injury turn blue or grey, or feel cold or numb.  · There is a bad smell, or new stains or pus-like (purulent) drainage coming from the cast.  MAKE SURE YOU:   · Understand these instructions.  · Will watch your condition.  · Will get help right away if you are not doing well or get worse.  Document Released: 11/17/2001 Document Revised: 05/20/2011 Document Reviewed: 10/09/2007  Exit[employer]® Patient Information ©2015 [employer]. This information is not intended to replace advice given to you by your health care provider. Make sure you discuss any questions you have with your health care provider.

## 2013-09-08 NOTE — ED Notes (Addendum)
States she tripped over her dog on Sunday  (6-27) , and pain /swelling has not gone away. Most of pain lateral foot

## 2013-09-13 NOTE — Telephone Encounter (Signed)
Closed encounter °

## 2013-09-14 ENCOUNTER — Telehealth: Payer: Self-pay | Admitting: *Deleted

## 2013-09-14 DIAGNOSIS — E1065 Type 1 diabetes mellitus with hyperglycemia: Secondary | ICD-10-CM

## 2013-09-14 DIAGNOSIS — IMO0002 Reserved for concepts with insufficient information to code with codable children: Secondary | ICD-10-CM

## 2013-09-14 NOTE — Telephone Encounter (Signed)
Left message on machine for patient to schedule a lab appointment Lipid panel ordered Message sent to mychart Diabetic bundle

## 2013-09-16 ENCOUNTER — Encounter: Payer: BC Managed Care – PPO | Admitting: Cardiovascular Disease

## 2013-09-17 ENCOUNTER — Other Ambulatory Visit: Payer: BC Managed Care – PPO

## 2013-09-22 ENCOUNTER — Other Ambulatory Visit: Payer: BC Managed Care – PPO

## 2013-09-23 ENCOUNTER — Other Ambulatory Visit (INDEPENDENT_AMBULATORY_CARE_PROVIDER_SITE_OTHER): Payer: BC Managed Care – PPO

## 2013-09-23 DIAGNOSIS — E1065 Type 1 diabetes mellitus with hyperglycemia: Secondary | ICD-10-CM

## 2013-09-23 DIAGNOSIS — IMO0002 Reserved for concepts with insufficient information to code with codable children: Secondary | ICD-10-CM

## 2013-09-23 LAB — MICROALBUMIN / CREATININE URINE RATIO
CREATININE, U: 74.6 mg/dL
MICROALB UR: 0.4 mg/dL (ref 0.0–1.9)
Microalb Creat Ratio: 0.5 mg/g (ref 0.0–30.0)

## 2013-09-23 LAB — BASIC METABOLIC PANEL
BUN: 19 mg/dL (ref 6–23)
CALCIUM: 9.6 mg/dL (ref 8.4–10.5)
CO2: 28 meq/L (ref 19–32)
CREATININE: 0.6 mg/dL (ref 0.4–1.2)
Chloride: 99 mEq/L (ref 96–112)
GFR: 107.82 mL/min (ref >60.00)
Glucose, Bld: 171 mg/dL — ABNORMAL HIGH (ref 70–99)
Potassium: 4 mEq/L (ref 3.5–5.1)
SODIUM: 135 meq/L (ref 135–145)

## 2013-09-23 LAB — LIPID PANEL
CHOL/HDL RATIO: 2
Cholesterol: 114 mg/dL (ref 0–200)
HDL: 49.9 mg/dL (ref >39.00)
LDL Cholesterol: 45 mg/dL (ref 0–99)
NONHDL: 64.1
Triglycerides: 98 mg/dL (ref 0.0–149.0)
VLDL: 19.6 mg/dL (ref 0.0–40.0)

## 2013-09-23 LAB — HEMOGLOBIN A1C: Hgb A1c MFr Bld: 9.4 % — ABNORMAL HIGH (ref 4.6–6.5)

## 2013-09-27 ENCOUNTER — Other Ambulatory Visit: Payer: Self-pay | Admitting: Family Medicine

## 2013-09-27 DIAGNOSIS — E1065 Type 1 diabetes mellitus with hyperglycemia: Secondary | ICD-10-CM

## 2013-09-27 DIAGNOSIS — IMO0002 Reserved for concepts with insufficient information to code with codable children: Secondary | ICD-10-CM

## 2013-10-06 ENCOUNTER — Telehealth: Payer: Self-pay | Admitting: Family Medicine

## 2013-10-06 ENCOUNTER — Telehealth: Payer: Self-pay | Admitting: Cardiovascular Disease

## 2013-10-06 NOTE — Telephone Encounter (Signed)
Pt is requesting samples of xarelto.

## 2013-10-06 NOTE — Telephone Encounter (Signed)
Pt need some samples of Xarelto please,she did not know the mg.

## 2013-10-06 NOTE — Telephone Encounter (Signed)
Spoke with pt, aware samples placed at the front desk for pick up 

## 2013-10-11 MED ORDER — RIVAROXABAN 20 MG PO TABS: 20.0000 mg | ORAL_TABLET | Freq: Every day | ORAL | Status: DC

## 2013-10-14 ENCOUNTER — Ambulatory Visit: Payer: BC Managed Care – PPO | Admitting: Internal Medicine

## 2013-10-23 ENCOUNTER — Encounter (HOSPITAL_COMMUNITY): Payer: Self-pay | Admitting: Emergency Medicine

## 2013-10-23 ENCOUNTER — Emergency Department (INDEPENDENT_AMBULATORY_CARE_PROVIDER_SITE_OTHER)
Admission: EM | Admit: 2013-10-23 | Discharge: 2013-10-23 | Disposition: A | Discharge status: Home / Self Care | Payer: BC Managed Care – PPO | Source: Home / Self Care | Attending: Family Medicine | Admitting: Family Medicine

## 2013-10-23 ENCOUNTER — Emergency Department (INDEPENDENT_AMBULATORY_CARE_PROVIDER_SITE_OTHER): Payer: BC Managed Care – PPO

## 2013-10-23 DIAGNOSIS — B9789 Other viral agents as the cause of diseases classified elsewhere: Secondary | ICD-10-CM

## 2013-10-23 DIAGNOSIS — B349 Viral infection, unspecified: Secondary | ICD-10-CM

## 2013-10-23 LAB — CBC WITH DIFFERENTIAL/PLATELET
BASOS ABS: 0.1 10*3/uL (ref 0.0–0.1)
Basophils Relative: 1 % (ref 0–1)
Eosinophils Absolute: 0 10*3/uL (ref 0.0–0.7)
Eosinophils Relative: 0 % (ref 0–5)
HEMATOCRIT: 34.5 % — AB (ref 36.0–46.0)
Hemoglobin: 11.7 g/dL — ABNORMAL LOW (ref 12.0–15.0)
Lymphocytes Relative: 18 % (ref 12–46)
Lymphs Abs: 0.9 10*3/uL (ref 0.7–4.0)
MCH: 29.8 pg (ref 26.0–34.0)
MCHC: 33.9 g/dL (ref 30.0–36.0)
MCV: 88 fL (ref 78.0–100.0)
Monocytes Absolute: 0.3 10*3/uL (ref 0.1–1.0)
Monocytes Relative: 6 % (ref 3–12)
NEUTROS ABS: 3.9 10*3/uL (ref 1.7–7.7)
Neutrophils Relative %: 75 % (ref 43–77)
Platelets: 201 10*3/uL (ref 150–400)
RBC: 3.92 MIL/uL (ref 3.87–5.11)
RDW: 12.5 % (ref 11.5–15.5)
WBC: 5.2 10*3/uL (ref 4.0–10.5)

## 2013-10-23 LAB — POCT URINALYSIS DIP (DEVICE)
Bilirubin Urine: NEGATIVE
GLUCOSE, UA: 250 mg/dL — AB
Hgb urine dipstick: NEGATIVE
KETONES UR: NEGATIVE mg/dL
Leukocytes, UA: NEGATIVE
Nitrite: NEGATIVE
PROTEIN: NEGATIVE mg/dL
Specific Gravity, Urine: <=1.005 (ref 1.005–1.030)
Urobilinogen, UA: 0.2 mg/dL (ref 0.0–1.0)
pH: 5.5 (ref 5.0–8.0)

## 2013-10-23 NOTE — ED Provider Notes (Signed)
CSN: [id number]     Arrival date & time 10/23/13  0913 History   First MD Initiated Contact with Patient 10/23/13 [phone]     Chief Complaint  Patient presents with  . Generalized Body Aches   (Consider location/radiation/quality/duration/timing/severity/associated sxs/prior Treatment) Patient is a 63 y.o. female presenting with general illness. The history is provided by the patient.  Illness Location:  3am awoke with chills and body aches, similar yest but resolved, sl urinary freq but otherwise ros is neg.  Severity:  Moderate Onset quality:  Sudden Progression:  Unchanged Chronicity:  New Associated symptoms: fever and myalgias   Associated symptoms: no abdominal pain, no chest pain, no cough, no diarrhea, no nausea, no rash, no sore throat and no vomiting     Past Medical History  Diagnosis Date  . Diabetes mellitus type II   . Hyperlipidemia   . Hypertension   . Allergic rhinitis   . Cataract   . CAD (coronary artery disease)     a. 2004: s/p MI in [place]. No PCI->Medical RX;  b. 07/2012 Cath: LM 30-40, LAD 70p, 70/52m, D1 80-90p, OM1 small 90p, OM2 large 80-90p, 47m, 70-80d, RCA 20-30 diff, EF 40%, glob HK.s/p CABG  . Ischemic cardiomyopathy     a. 07/2012 Echo: EF 35%, Sev inferoseptal HK, mildly dil LA, Peak PASP 26mmHg.  Marland Kitchen PAF (paroxysmal atrial fibrillation)     a. 07/2012: Amio and xarelto initiated.  Marland Kitchen LBBB (left bundle branch block)     a. intermittent - present during rapid afib 07/2012.  . Breast cancer 2002    Patient reports left breast cancer diagnosis in 2002 treated with bilateral mastectomy positive lymph nodes with left axillary dissection followed by chemotherapy of unknown type  . Neuromuscular disorder     Patient reports chronic numbness in the right foot related to previous surgery on the right leg and "nerve damage"  . Shortness of breath   . Biventricular cardiac pacemaker - St Jude, Nov 2014 03/04/2013  . SBO (small bowel obstruction)    Past  Surgical History  Procedure Laterality Date  . Mastectomy  2002    bilateral  . Tubal ligation  1987  . Bladder surgery  2000  . Right leg surgery  2001    torn ligaments and tendons x4 surgeries  . Appendectomy  1974  . Cholecystectomy  1974  . Abdominal hysterectomy  2000  . Breast surgery      Bilateral mastectomy for left breast cancer unknown stage but patient reports positive nodes was treated with chemotherapy  . Abdominal hysterectomy    . Coronary artery bypass graft N/A 09/28/2012    Procedure: CORONARY ARTERY BYPASS GRAFTING (CABG);  Surgeon: [name], MD;  Location: [facility];  Service: Open Heart Surgery;  Laterality: N/A;  CABG x four, using left internal mammary artery and left leg greater saphenous vein harvested endoscopically  . Maze N/A 09/28/2012    Procedure: MAZE;  Surgeon: [name], MD;  Location: [facility];  Service: Open Heart Surgery;  Laterality: N/A;  . Intraoperative transesophageal echocardiogram N/A 09/28/2012    Procedure: INTRAOPERATIVE TRANSESOPHAGEAL ECHOCARDIOGRAM;  Surgeon: [name], MD;  Location: [facility];  Service: Open Heart Surgery;  Laterality: N/A;  . Epicardial pacing lead placement N/A 09/28/2012    Procedure: EPICARDIAL PACING LEAD PLACEMENT;  Surgeon: [name], MD;  Location: [facility];  Service: Thoracic;  Laterality: N/A;  LV LEAD PLACEMENT  . Bi-ventricular pacemaker insertion (crt-p)  01/25/2013  Dr Lovena Le ([facility])   Family History  Problem Relation Age of Onset  . Kidney failure Mother   . Heart disease Mother     Died mid 08Q complications of diabetes  . Arthritis Other   . Cancer Other     colon, ovarian  . Diabetes Other   . Hyperlipidemia Other   . Kidney failure Other   . Colon cancer Sister    History  Substance Use Topics  . Smoking status: Former Smoker -- 1.00 packs/day for 10 years    Types: Cigarettes    Quit date: 03/11/2012  . Smokeless tobacco: Never Used     Comment: Quit in January  .  Alcohol Use: No   OB History   Grav Para Term Preterm Abortions TAB SAB Ect Mult Living                 Review of Systems  Constitutional: Positive for fever and chills.  HENT: Negative.  Negative for sore throat.   Respiratory: Negative.  Negative for cough.   Cardiovascular: Negative.  Negative for chest pain.  Gastrointestinal: Negative.  Negative for nausea, vomiting, abdominal pain and diarrhea.  Genitourinary: Negative.   Musculoskeletal: Positive for myalgias.  Skin: Negative for rash.    Allergies  Review of patient's allergies indicates no known allergies.  Home Medications   Prior to Admission medications   Medication Sig Start Date End Date Taking? Authorizing Provider  aspirin 81 MG tablet Take 1 tablet (81 mg total) by mouth daily. 07/21/12   Rogelia Mire, NP  atorvastatin (LIPITOR) 80 MG tablet TAKE 1 TABLET (80 MG TOTAL) BY MOUTH DAILY.    Mihai Croitoru, MD  carvedilol (COREG) 25 MG tablet Take 1 tablet (25 mg total) by mouth 2 (two) times daily. 05/20/13   Mihai Croitoru, MD  cephALEXin (KEFLEX) 500 MG capsule Take 1 capsule (500 mg total) by mouth 2 (two) times daily. 05/29/13   Lyndal Pulley, DO  furosemide (LASIX) 40 MG tablet Take 40 mg by mouth daily.    Historical Provider, MD  glipiZIDE (GLUCOTROL) 10 MG tablet Take 1 tablet (10 mg total) by mouth 2 (two) times daily before a meal. 05/13/13   Dorena Cookey, MD  glucose blood (ONETOUCH VERIO) test strip Check 2 times daily. Use as instructed 02/11/13   Dorena Cookey, MD  HYDROcodone-acetaminophen (NORCO/VICODIN) 5-325 MG per tablet 1 to 2 tabs every 4 to 6 hours as needed for pain. 09/08/13   Harden Mo, MD  losartan (COZAAR) 25 MG tablet Take 1 tablet (25 mg total) by mouth 2 (two) times daily. 03/18/13   Mihai Croitoru, MD  metFORMIN (GLUCOPHAGE) 1000 MG tablet Take 1 tablet (1,000 mg total) by mouth 2 (two) times daily with a meal. 01/15/13   Philemon Kingdom, MD  nitroGLYCERIN (NITROSTAT) 0.4 MG SL  tablet Place 1 tablet (0.4 mg total) under the tongue every 5 (five) minutes as needed for chest pain. 07/21/12   Rogelia Mire, NP  rivaroxaban (XARELTO) 20 MG TABS tablet Take 1 tablet (20 mg total) by mouth daily with supper. 10/11/13   Dorena Cookey, MD   BP 121/87  Pulse 89  Temp(Src) 100.5 F (38.1 C) (Oral)  Resp 16  SpO2 97% Physical Exam  Nursing note and vitals reviewed. Constitutional: She is oriented to person, place, and time. She appears well-developed and well-nourished.  HENT:  Mouth/Throat: Oropharynx is clear and moist.  Neck: Normal range of motion. Neck supple.  Cardiovascular: Normal heart sounds.   Pulmonary/Chest: Effort normal and breath sounds normal.  Abdominal: Soft. Bowel sounds are normal.  Neurological: She is alert and oriented to person, place, and time.  Skin: Skin is warm and dry.    ED Course  Procedures (including critical care time) Labs Review Labs Reviewed  CBC WITH DIFFERENTIAL - Abnormal; Notable for the following:    Hemoglobin 11.7 (*)    HCT 34.5 (*)    All other components within normal limits  POCT URINALYSIS DIP (DEVICE) - Abnormal; Notable for the following:    Glucose, UA 250 (*)    All other components within normal limits   Cbc-wnl. Imaging Review Dg Chest 2 View  10/23/2013   CLINICAL DATA:  Generalized fatigue.  EXAM: CHEST  2 VIEW  COMPARISON:  01/26/2013  FINDINGS: Changes from CABG surgery are stable. Cardiac silhouette is normal in size and configuration. No mediastinal or hilar masses or evidence of adenopathy.  Lungs are clear.  No pleural effusion.  No pneumothorax.  Left anterior chest wall sequential pacemaker is stable in well positioned. There are stable changes from bilateral breast surgery.  Bony thorax is intact.  IMPRESSION: No acute cardiopulmonary disease.   Electronically Signed   By: Lajean Manes M.D.   On: 10/23/2013 10:42   X-rays reviewed and report per radiologist.   MDM   1. Systemic viral  illness        Billy Fischer, MD 10/23/13 1106

## 2013-10-23 NOTE — Discharge Instructions (Signed)
Drink plenty of fluids as discussed, treat fever as needed, and mucinex or delsym for cough. Return or see your doctor if further problems °

## 2013-10-23 NOTE — ED Notes (Signed)
Pt     Reports    Symptoms     Of         Body  Aches   Chills     And       Frequency  Of  Urination         With  Some  Discomfort  When  She  Urinates            She       Is  Awake  And  Alert

## 2013-11-02 ENCOUNTER — Ambulatory Visit (INDEPENDENT_AMBULATORY_CARE_PROVIDER_SITE_OTHER): Payer: BC Managed Care – PPO | Admitting: Cardiovascular Disease

## 2013-11-02 ENCOUNTER — Encounter: Payer: Self-pay | Admitting: Cardiovascular Disease

## 2013-11-02 VITALS — BP 124/78 | HR 75 | Resp 16 | Ht 64.5 in | Wt 156.2 lb

## 2013-11-02 DIAGNOSIS — I480 Paroxysmal atrial fibrillation: Secondary | ICD-10-CM

## 2013-11-02 DIAGNOSIS — I2589 Other forms of chronic ischemic heart disease: Secondary | ICD-10-CM

## 2013-11-02 DIAGNOSIS — I4891 Unspecified atrial fibrillation: Secondary | ICD-10-CM

## 2013-11-02 DIAGNOSIS — I5043 Acute on chronic combined systolic (congestive) and diastolic (congestive) heart failure: Secondary | ICD-10-CM

## 2013-11-02 DIAGNOSIS — I5042 Chronic combined systolic (congestive) and diastolic (congestive) heart failure: Secondary | ICD-10-CM

## 2013-11-02 DIAGNOSIS — I509 Heart failure, unspecified: Secondary | ICD-10-CM

## 2013-11-02 DIAGNOSIS — I255 Ischemic cardiomyopathy: Secondary | ICD-10-CM

## 2013-11-02 LAB — MDC_IDC_ENUM_SESS_TYPE_INCLINIC
Battery Remaining Longevity: 85.2 mo
Battery Voltage: 2.98 V
Brady Statistic RA Percent Paced: 0.12 %
Date Time Interrogation Session: 20150825101152
Implantable Pulse Generator Model: 3222
Implantable Pulse Generator Serial Number: 2981305
Lead Channel Impedance Value: 437.5 Ohm
Lead Channel Impedance Value: 450 Ohm
Lead Channel Pacing Threshold Amplitude: 0.75 V
Lead Channel Pacing Threshold Amplitude: 0.75 V
Lead Channel Pacing Threshold Amplitude: 1 V
Lead Channel Pacing Threshold Amplitude: 1 V
Lead Channel Pacing Threshold Amplitude: 1.25 V
Lead Channel Pacing Threshold Pulse Width: 0.4 ms
Lead Channel Pacing Threshold Pulse Width: 0.4 ms
Lead Channel Pacing Threshold Pulse Width: 0.5 ms
Lead Channel Sensing Intrinsic Amplitude: 12 mV
Lead Channel Setting Pacing Amplitude: 2 V
Lead Channel Setting Pacing Amplitude: 2.5 V
Lead Channel Setting Pacing Pulse Width: 0.4 ms
Lead Channel Setting Pacing Pulse Width: 0.5 ms
Lead Channel Setting Sensing Sensitivity: 2 mV
MDC IDC MSMT LEADCHNL LV PACING THRESHOLD AMPLITUDE: 1.25 V
MDC IDC MSMT LEADCHNL LV PACING THRESHOLD PULSEWIDTH: 0.5 ms
MDC IDC MSMT LEADCHNL RA IMPEDANCE VALUE: 437.5 Ohm
MDC IDC MSMT LEADCHNL RA PACING THRESHOLD PULSEWIDTH: 0.4 ms
MDC IDC MSMT LEADCHNL RA SENSING INTR AMPL: 1.5 mV
MDC IDC MSMT LEADCHNL RV PACING THRESHOLD PULSEWIDTH: 0.4 ms
MDC IDC SET LEADCHNL LV PACING AMPLITUDE: 2.25 V
MDC IDC STAT BRADY RV PERCENT PACED: 97 %

## 2013-11-02 NOTE — Patient Instructions (Addendum)
Remote monitoring is used to monitor your pacemaker from home. This monitoring reduces the number of office visits required to check your device to one time per year. It allows [place] to keep an eye on the functioning of your device to ensure it is working properly. You are scheduled for a device check from home on 02-07-2014. You may send your transmission at any time that day. If you have a wireless device, the transmission will be sent automatically. After your physician reviews your transmission, you will receive a postcard with your next transmission date.  Your physician recommends that you schedule a follow-up appointment in: 6 months with Dr.Croitoru

## 2013-11-02 NOTE — Progress Notes (Signed)
Patient ID: [name], female   DOB: 12-10-1950, 63 y.o.   MRN: [id number]      Reason for office visit CAD s/p CABG, ischemic cardiomyopathy, chronic systolic and diastolic heart failure, CRT-D  [name] is generally feeling well. She continues to have mild (NYHA functional class II) exertional dyspnea. She does not have angina pectoris. Glycemic control is improving but remains suboptimal with a hemoglobin A1c of roughly 8%. Her latest lipid profile was excellent. She denies dizziness, syncope, palpitations or lower extremity edema.  Interrogation of her CRT-P device shows normal function with 97% biventricular pacing and no significant episodes of atrial or ventricular arrhythmia. Battery longevity is estimated at roughly 7 years. Thoracic impedance readings show normal findings (brief evidence of hypervolemia in May self corrected).  [name] has a long-standing history of coronary artery disease and its complications. She had an initial presentation with myocardial infarction in 2004 and then in May 2014 presented with congestive heart failure and atrial fibrillation in the setting of three-vessel coronary artery disease. She underwent bypass surgery (July 2014, Dr. Tyrone Sage, LIMA to LAD, SVG to ramus intermedius, sequential SVG to OM1 and OM 2, surgical maze procedure) but had persistent depressed left ventricular systolic function and received a dual-chamber biventricular pacemaker (St. Jude Allure, Dr. Ladona Ridgel in November 2014). She has type 2 diabetes mellitus that has been difficult to control as well as hypertension and hyperlipidemia. In generates 2015 she was admitted with acute on chronic heart failure due to excessive intravenous fluid administration during an episode of small bowel obstruction. Last ejection fraction assessment was before she received her CRT-P device showed an EF of 35-40%  No Known Allergies  Current Outpatient Prescriptions  Medication Sig Dispense Refill  .  aspirin 81 MG tablet Take 1 tablet (81 mg total) by mouth daily.  30 tablet    . atorvastatin (LIPITOR) 80 MG tablet TAKE 1 TABLET (80 MG TOTAL) BY MOUTH DAILY.  30 tablet  8  . carvedilol (COREG) 25 MG tablet Take 1 tablet (25 mg total) by mouth 2 (two) times daily.  60 tablet  6  . furosemide (LASIX) 40 MG tablet Take 40 mg by mouth daily.      Marland Kitchen glipiZIDE (GLUCOTROL) 10 MG tablet Take 1 tablet (10 mg total) by mouth 2 (two) times daily before a meal.  200 tablet  3  . glucose blood (ONETOUCH VERIO) test strip Check 2 times daily. Use as instructed  200 each  3  . losartan (COZAAR) 25 MG tablet Take 1 tablet (25 mg total) by mouth 2 (two) times daily.  180 tablet  3  . metFORMIN (GLUCOPHAGE) 1000 MG tablet Take 1 tablet (1,000 mg total) by mouth 2 (two) times daily with a meal.  60 tablet  11  . nitroGLYCERIN (NITROSTAT) 0.4 MG SL tablet Place 1 tablet (0.4 mg total) under the tongue every 5 (five) minutes as needed for chest pain.  25 tablet  3  . rivaroxaban (XARELTO) 20 MG TABS tablet Take 1 tablet (20 mg total) by mouth daily with supper.  20 tablet  0   No current facility-administered medications for this visit.    Past Medical History  Diagnosis Date  . Diabetes mellitus type II   . Hyperlipidemia   . Hypertension   . Allergic rhinitis   . Cataract   . CAD (coronary artery disease)     a. 2004: s/p MI in [place]. No PCI->Medical RX;  b. 07/2012 Cath: LM 30-40,  LAD 70p, 70/27m, D1 80-90p, OM1 small 90p, OM2 large 80-90p, 39m, 70-80d, RCA 20-30 diff, EF 40%, glob HK.s/p CABG  . Ischemic cardiomyopathy     a. 07/2012 Echo: EF 35%, Sev inferoseptal HK, mildly dil LA, Peak PASP .  Marland Kitchen PAF (paroxysmal atrial fibrillation)     a. 07/2012: Amio and xarelto initiated.  Marland Kitchen LBBB (left bundle branch block)     a. intermittent - present during rapid afib 07/2012.  . Breast cancer 2002    Patient reports left breast cancer diagnosis in 2002 treated with bilateral mastectomy positive lymph  nodes with left axillary dissection followed by chemotherapy of unknown type  . Neuromuscular disorder     Patient reports chronic numbness in the right foot related to previous surgery on the right leg and "nerve damage"  . Shortness of breath   . Biventricular cardiac pacemaker - St Jude, Nov 2014 03/04/2013  . SBO (small bowel obstruction)     Past Surgical History  Procedure Laterality Date  . Mastectomy  2002    bilateral  . Tubal ligation  1987  . Bladder surgery  2000  . Right leg surgery  2001    torn ligaments and tendons x4 surgeries  . Appendectomy  1974  . Cholecystectomy  1974  . Abdominal hysterectomy  2000  . Breast surgery      Bilateral mastectomy for left breast cancer unknown stage but patient reports positive nodes was treated with chemotherapy  . Abdominal hysterectomy    . Coronary artery bypass graft N/A 09/28/2012    Procedure: CORONARY ARTERY BYPASS GRAFTING (CABG);  Surgeon: Delight Ovens, MD;  Location: [facility] OR;  Service: Open Heart Surgery;  Laterality: N/A;  CABG x four, using left internal mammary artery and left leg greater saphenous vein harvested endoscopically  . Maze N/A 09/28/2012    Procedure: MAZE;  Surgeon: Delight Ovens, MD;  Location: [facility] OR;  Service: Open Heart Surgery;  Laterality: N/A;  . Intraoperative transesophageal echocardiogram N/A 09/28/2012    Procedure: INTRAOPERATIVE TRANSESOPHAGEAL ECHOCARDIOGRAM;  Surgeon: Delight Ovens, MD;  Location[facility] OR;  Service: Open Heart Surgery;  Laterality: N/A;  . Epicardial pacing lead placement N/A 09/28/2012    Procedure: EPICARDIAL PACING LEAD PLACEMENT;  Surgeon: Delight Ovens, MD;  Location: [facility] OR;  Service: Thoracic;  Laterality: N/A;  LV LEAD PLACEMENT  . Bi-ventricular pacemaker insertion (crt-p)  01/25/2013    Dr Ladona Ridgel ([facility])    Family History  Problem Relation Age of Onset  . Kidney failure Mother   . Heart disease Mother     Died mid 87s complications of diabetes  .  Arthritis Other   . Cancer Other     colon, ovarian  . Diabetes Other   . Hyperlipidemia Other   . Kidney failure Other   . Colon cancer Sister     History   Social History  . Marital Status: Married    Spouse Name: N/A    Number of Children: 3  . Years of Education: N/A   Occupational History  . Office work    Social History Main Topics  . Smoking status: Former Smoker -- 1.00 packs/day for 10 years    Types: Cigarettes    Quit date: 03/11/2012  . Smokeless tobacco: Never Used     Comment: Quit in January  . Alcohol Use: No  . Drug Use: No  . Sexual Activity: Yes    Partners: Female   Other Topics Concern  .  Not on file   Social History Narrative   Lives with husband and mother in law.     Regular exercise: walking   Caffeine use: 2 cups of coffee in the morning    Review of systems: The patient specifically denies any chest pain at rest or with exertion, dyspnea at rest or with usual exertion (she develops dyspnea walking up stairs), orthopnea, paroxysmal nocturnal dyspnea, syncope, palpitations, focal neurological deficits, intermittent claudication, lower extremity edema, unexplained weight gain, cough, hemoptysis or wheezing.  The patient also denies abdominal pain, nausea, vomiting, dysphagia, diarrhea, constipation, polyuria, polydipsia, dysuria, hematuria, frequency, urgency, abnormal bleeding or bruising, fever, chills, unexpected weight changes, mood swings, change in skin or hair texture, change in voice quality, auditory or visual problems, allergic reactions or rashes, new musculoskeletal complaints other than usual "aches and pains".  Pain and restricted motion in her left shoulder. She has occasional mild bleeding from her gums when she brushes her teeth and mild epistaxis.   PHYSICAL EXAM BP 124/78  Pulse 75  Resp 16  Ht 5' 4.5" (1.638 m)  Wt 156 lb 3.2 oz (70.852 kg)  BMI 26.41 kg/m2  General: Alert, oriented x3, no distress Head: no evidence  of trauma, PERRL, EOMI, no exophtalmos or lid lag, no myxedema, no xanthelasma; normal ears, nose and oropharynx Neck: normal jugular venous pulsations and no hepatojugular reflux; brisk carotid pulses without delay and no carotid bruits Chest: clear to auscultation, no signs of consolidation by percussion or palpation, normal fremitus, symmetrical and full respiratory excursions; healthy sternotomy and pacemaker scar Cardiovascular: normal position and quality of the apical impulse, regular rhythm, normal first and paradoxically split second heart sounds, no murmurs, rubs or gallops Abdomen: no tenderness or distention, no masses by palpation, no abnormal pulsatility or arterial bruits, normal bowel sounds, no hepatosplenomegaly Extremities: no clubbing, cyanosis or edema; 2+ radial, ulnar and brachial pulses bilaterally; 2+ right femoral, posterior tibial and dorsalis pedis pulses; 2+ left femoral, posterior tibial and dorsalis pedis pulses; no subclavian or femoral bruits Neurological: grossly nonfocal   EKG: Atrial sensed, biventricular paced with very narrow QRS (108 ms)  Lipid Panel     Component Value Date/Time   CHOL 114 09/23/2013 1330   TRIG 98.0 09/23/2013 1330   HDL 49.90 09/23/2013 1330   CHOLHDL 2 09/23/2013 1330   VLDL 19.6 09/23/2013 1330   LDLCALC 45 09/23/2013 1330    BMET    Component Value Date/Time   NA 135 09/23/2013 1330   K 4.0 09/23/2013 1330   CL 99 09/23/2013 1330   CO2 28 09/23/2013 1330   GLUCOSE 171* 09/23/2013 1330   BUN 19 09/23/2013 1330   CREATININE 0.6 09/23/2013 1330   CREATININE 0.72 11/25/2012 1628   CALCIUM 9.6 09/23/2013 1330   GFRNONAA >90 03/31/2013 0145   GFRAA >90 03/31/2013 0145     ASSESSMENT AND PLAN Chronic combined systolic and diastolic CHF, NYHA class 2  She appears to be euvolemic. NYHA class II. This is the maximum dose of carvedilol tolerates   Biventricular cardiac pacemaker - St Jude, Nov 2014  Normal device function, good response to  CRT, remarkably narrow QRS  CAD (coronary artery disease)  S/P CABG, MAZE and LA appendage clipping 09/2012.No angina.   Ischemic cardiomyopathy  NYHA class II, euvolemic. Last EF assessment by echo October 2014 showed ejection fraction of 35-40%. This is probably mostly ischemic cardiomyopathy although cannot exclude a contribution of chemotherapy for breast cancer in the past.   PAF (  paroxysmal atrial fibrillation)  S/P CABG, MAZE and LA appendage clipping 09/2012. Plan to discontinue anticoagulation if she has no further atrial fibrillation at her next device check   Hyperlipidemia Excellent lipid profile even if she has not achieved perfect glycemic control  DM type 2  Avoid Actos     Orders Placed This Encounter  Procedures  . EKG 12-Lead   Keanen Dohse  Thurmon Fair, MD, [facility] [phone] office [phone] pager

## 2013-11-04 ENCOUNTER — Encounter: Payer: Self-pay | Admitting: Internal Medicine

## 2013-11-04 ENCOUNTER — Ambulatory Visit (INDEPENDENT_AMBULATORY_CARE_PROVIDER_SITE_OTHER): Payer: BC Managed Care – PPO | Admitting: Internal Medicine

## 2013-11-04 VITALS — BP 124/66 | HR 83 | Temp 98.5°F | Resp 12 | Wt 155.0 lb

## 2013-11-04 DIAGNOSIS — IMO0002 Reserved for concepts with insufficient information to code with codable children: Secondary | ICD-10-CM

## 2013-11-04 DIAGNOSIS — E1065 Type 1 diabetes mellitus with hyperglycemia: Secondary | ICD-10-CM

## 2013-11-04 MED ORDER — FUROSEMIDE 40 MG PO TABS: 20.0000 mg | ORAL_TABLET | Freq: Every day | ORAL | Status: DC

## 2013-11-04 MED ORDER — CANAGLIFLOZIN 100 MG PO TABS: ORAL_TABLET | ORAL | Status: DC

## 2013-11-04 NOTE — Progress Notes (Signed)
Patient ID: [name], female   DOB: 1950/05/25, 63 y.o.   MRN: [id number]  HPI: [name] is a 63 y.o.-year-old female, returning for f/u for DM2, dx 2002, insulin-independent, uncontrolled, without complications. Last visit 9 mo ago!  Last hemoglobin A1c was: Lab Results  Component Value Date   HGBA1C 9.4* 09/23/2013   HGBA1C 9.0* 08/09/2013   HGBA1C 8.0* 05/13/2013  Patient has been on insulin before >> hypolycemia. Now on oral meds (see below).  Pt is on a regimen of: - Metformin 1000 mg po bid - Glipizide 10 mg bid Could not afford Januvia 100 mg daily >> 100$ reduced from 348%. She was on Amaryl 2 mg bid.  Pt checks her sugars 1x a day and they are: - am: 56-140 >> 107-186, most 120-140 >> 170-200 - before lunch: 80-90 >> 70-191 >> 74 few times, 100-160 - before dinner: 56-60s but sometimes 102 >> 49-156 >> 100-150 - bedtime: 80-110 >> 60-112 >> n/c Still has lows. Lowest sugar was 74; she has hypoglycemia awareness at 60. Highest sugar was 200.  She eats a lot of fruit. She does not exercise (broke her foot).  - no CKD, last BUN/creatinine:  Lab Results  Component Value Date   BUN 19 09/23/2013   CREATININE 0.6 09/23/2013  she is on losartan. - last set of lipids: Lab Results  Component Value Date   CHOL 114 09/23/2013   HDL 49.90 09/23/2013   LDLCALC 45 09/23/2013   LDLDIRECT 134.3 09/19/2009   TRIG 98.0 09/23/2013   CHOLHDL 2 09/23/2013  she is on atorvastatin. - last eye exam was 05/2013. Has cataracts. No DR.  - no numbness and tingling in her feet. She is on aspirin 81.  ROS: Constitutional: no weight gain/loss, + fatigue, no subjective hyperthermia/hypothermia Eyes: no blurry vision - needs new glasses, no xerophthalmia ENT: no sore throat, no nodules palpated in throat, no dysphagia/odynophagia, no hoarseness Cardiovascular: no CP/SOB/no palpitations/+ leg swelling Respiratory: no cough/SOB Gastrointestinal: no N/V/D/C Musculoskeletal: no muscle/joint  aches Skin: no rashes Neurological: no tremors/numbness/tingling/dizziness + low libido  I reviewed pt's medications, allergies, PMH, social hx, family hx and no changes required, except as mentioned above.   PE: BP 124/66  Pulse 83  Temp(Src) 98.5 F (36.9 C) (Oral)  Resp 12  Wt 155 lb (70.308 kg)  SpO2 97% Wt Readings from Last 3 Encounters:  11/04/13 155 lb (70.308 kg)  11/02/13 156 lb 3.2 oz (70.852 kg)  05/29/13 154 lb (69.854 kg)   Constitutional: normal weight, in NAD Eyes: PERRLA, EOMI, no exophthalmos ENT: moist mucous membranes, no thyromegaly, no cervical lymphadenopathy Cardiovascular: RRR, 2/6 SEM, no RG, + bilateral periankle edema Respiratory: CTA B Gastrointestinal: abdomen soft, NT, ND, BS+ Musculoskeletal: no deformities, strength intact in all 4 Skin: moist, warm, no rashes Neurological: no tremor with outstretched hands, DTR normal in all 4  ASSESSMENT: 1. 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 Patient with long-standing, recently more uncontrolled diabetes, on oral antidiabetic regimen. Her highest sugars are in am. - We discussed about options for treatment, and I suggested to start Invokana, since this also acts on fasting sugars. I will advise her to decrease the Lasix dose to half as she starts Invokana , but I will check with Dr Sallyanne Kuster about this:  Patient Instructions  Continue: - Metformin 1000 mg 2x  a day - Glipizide 10 mg 2x a day Start Invokana 100 mg in am Decrease Lasix to 20 mg daily.  Please return in 1 month with your sugar log.  - we discussed about SEs of Invokana, which are: dizziness (advised to be careful when stands from sitting position), decreased BP - usually not < normal (BP today is not low), and fungal UTIs (advised to let me know if develops one).  - given discount card for  Invokana - continue checking her sugars at different times of the day - check 2 times a day, rotating checks >> bring log at next visit - up to date with eye exams - will check HbA1C today. - Return to clinic in 1 mo with sugar log

## 2013-11-04 NOTE — Patient Instructions (Addendum)
Continue: - Metformin 1000 mg 2x a day - Glipizide 10 mg 2x a day Start Invokana 100 mg in am Decrease Lasix to 20 mg daily.   Please return in 1 month with your sugar log.

## 2013-11-22 ENCOUNTER — Telehealth: Payer: Self-pay | Admitting: Cardiovascular Disease

## 2013-11-22 ENCOUNTER — Encounter: Payer: Self-pay | Admitting: Cardiovascular Disease

## 2013-11-22 MED ORDER — RIVAROXABAN 20 MG PO TABS: 20.0000 mg | ORAL_TABLET | Freq: Every day | ORAL | Status: DC

## 2013-11-22 NOTE — Telephone Encounter (Signed)
Spoke with patient and let her know that Xarelto samples would be up front for pick up. Patient voiced understanding

## 2013-11-22 NOTE — Telephone Encounter (Signed)
Would like some samples of Xarelto 20 mg please.. °

## 2013-11-27 ENCOUNTER — Encounter: Payer: Self-pay | Admitting: Family Medicine

## 2013-11-29 MED ORDER — RIVAROXABAN 20 MG PO TABS: 20.0000 mg | ORAL_TABLET | Freq: Every day | ORAL | Status: DC

## 2013-12-03 ENCOUNTER — Other Ambulatory Visit: Payer: Self-pay | Admitting: *Deleted

## 2013-12-03 MED ORDER — RIVAROXABAN 20 MG PO TABS: 20.0000 mg | ORAL_TABLET | Freq: Every day | ORAL | Status: DC

## 2013-12-06 ENCOUNTER — Other Ambulatory Visit: Payer: Self-pay

## 2013-12-06 MED ORDER — RIVAROXABAN 20 MG PO TABS: 20.0000 mg | ORAL_TABLET | Freq: Every day | ORAL | Status: DC

## 2013-12-06 NOTE — Telephone Encounter (Signed)
Rx sent to pharmacy   

## 2013-12-17 ENCOUNTER — Ambulatory Visit: Payer: BC Managed Care – PPO | Admitting: Internal Medicine

## 2013-12-20 ENCOUNTER — Ambulatory Visit: Payer: BC Managed Care – PPO | Admitting: Internal Medicine

## 2014-01-02 ENCOUNTER — Other Ambulatory Visit: Payer: Self-pay | Admitting: Cardiovascular Disease

## 2014-01-07 ENCOUNTER — Encounter: Payer: Self-pay | Admitting: Cardiovascular Disease

## 2014-01-10 ENCOUNTER — Telehealth: Payer: Self-pay | Admitting: Cardiovascular Disease

## 2014-01-10 MED ORDER — RIVAROXABAN 20 MG PO TABS: 20.0000 mg | ORAL_TABLET | Freq: Every day | ORAL | Status: DC

## 2014-01-10 NOTE — Telephone Encounter (Signed)
Pt called in wanting to know if she could get some samples of Xarelto. Please call  Thanks

## 2014-01-10 NOTE — Telephone Encounter (Signed)
Patient notified samples are at front desk for pick up.

## 2014-01-14 ENCOUNTER — Ambulatory Visit (INDEPENDENT_AMBULATORY_CARE_PROVIDER_SITE_OTHER): Payer: BC Managed Care – PPO | Admitting: Internal Medicine

## 2014-01-14 ENCOUNTER — Encounter: Payer: Self-pay | Admitting: Internal Medicine

## 2014-01-14 ENCOUNTER — Ambulatory Visit: Payer: BC Managed Care – PPO | Admitting: Internal Medicine

## 2014-01-14 VITALS — BP 110/72 | HR 73 | Temp 98.0°F | Wt 152.4 lb

## 2014-01-14 DIAGNOSIS — IMO0002 Reserved for concepts with insufficient information to code with codable children: Secondary | ICD-10-CM

## 2014-01-14 DIAGNOSIS — E1165 Type 2 diabetes mellitus with hyperglycemia: Secondary | ICD-10-CM

## 2014-01-14 MED ORDER — GLIPIZIDE 5 MG PO TABS: ORAL_TABLET | ORAL | Status: DC

## 2014-01-14 MED ORDER — EXENATIDE ER 2 MG ~~LOC~~ PEN: PEN_INJECTOR | SUBCUTANEOUS | Status: DC

## 2014-01-14 NOTE — Progress Notes (Signed)
Patient ID: [name], female   DOB: 29-Mar-1950, 63 y.o.   MRN: [id number]  HPI: [name] is a 63 y.o.-year-old female, returning for f/u for DM2, dx 2002, insulin-independent, uncontrolled, without complications. Last visit 2.5 mo ago.  Last hemoglobin A1c was: Lab Results  Component Value Date   HGBA1C 9.4* 09/23/2013   HGBA1C 9.0* 08/09/2013   HGBA1C 8.0* 05/13/2013  Patient has been on insulin before >> hypolycemia.   Pt is on a regimen of: - Metformin 1000 mg po bid - Glipizide 10 mg bid - Invokana 100 mg (started 10/2013) - no yeast inf or UTIs. Could not afford Januvia 100 mg daily >> 100$ reduced from 348%. She was on Amaryl 2 mg bid.  Pt checks her sugars 1x a day and they are: - am: 56-140 >> 107-186, most 120-140 >> 170-200 >> 170-233 - before lunch: 80-90 >> 70-191 >> 74 few times, 100-160 >> 130-220 - before dinner: 56-60s but sometimes 102 >> 49-156 >> 100-150 >> 98-161 - bedtime: 80-110 >> 60-112 >> n/c >> 63, 81 Still has lows. Lowest sugar was 74 >> 63 after dinner x1; she has hypoglycemia awareness at 60. Highest sugar was 233.  She eats a lot of fruit. She started to walk for exercise.   - no CKD, last BUN/creatinine:  Lab Results  Component Value Date   BUN 19 09/23/2013   CREATININE 0.6 09/23/2013  she is on losartan. - last set of lipids: Lab Results  Component Value Date   CHOL 114 09/23/2013   HDL 49.90 09/23/2013   LDLCALC 45 09/23/2013   LDLDIRECT 134.3 09/19/2009   TRIG 98.0 09/23/2013   CHOLHDL 2 09/23/2013  she is on atorvastatin. - last eye exam was 05/2013. Has cataracts. No DR.  - no numbness and tingling in her feet. She is on aspirin 81.  ROS: Constitutional: no weight gain/loss, no fatigue, no subjective hyperthermia/hypothermia Eyes: no blurry vision - needs new glasses, no xerophthalmia ENT: no sore throat, no nodules palpated in throat, no dysphagia/odynophagia, no hoarseness Cardiovascular: no CP/SOB/no  palpitations/leg swelling Respiratory: no cough/SOB Gastrointestinal: no N/V/D/C Musculoskeletal: no muscle/joint aches Skin: no rashes Neurological: no tremors/numbness/tingling/dizziness  I reviewed pt's medications, allergies, PMH, social hx, family hx and no changes required, except as mentioned above.   PE: BP 110/72 mmHg  Pulse 73  Temp(Src) 98 F (36.7 C) (Oral)  Wt 152 lb 6.4 oz (69.128 kg)  SpO2 98% Wt Readings from Last 3 Encounters:  01/14/14 152 lb 6.4 oz (69.128 kg)  11/04/13 155 lb (70.308 kg)  11/02/13 156 lb 3.2 oz (70.852 kg)   Constitutional: normal weight, in NAD Eyes: PERRLA, EOMI, no exophthalmos ENT: moist mucous membranes, no thyromegaly, no cervical lymphadenopathy Cardiovascular: RRR, 2/6 SEM, no RG, no leg edema Respiratory: CTA B Gastrointestinal: abdomen soft, NT, ND, BS+ Musculoskeletal: no deformities, strength intact in all 4 Skin: moist, warm, no rashes Neurological: no tremor with outstretched hands, DTR normal in all 4  ASSESSMENT: 1. 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 Patient with long-standing, recently more uncontrolled diabetes, on oral antidiabetic regimen. Her highest sugars are still in am. - We discussed about options for treatment, and I suggested to add Bydureon:  Patient Instructions  Please continue: - Metformin 1000 mg 2x a day - Invokana 100 mg in am  Decrease: - Glipizide  to 10 mg in am and 5 before dinner  Start Bydureon 2 mg on Sundays.  Please stop at the lab.  Please come back for a follow-up appointment in 3 months  - continue checking her sugars at different times of the day - check 2 times a day, rotating checks >> bring log at next visit - up to date with eye exams - will check HbA1C today. - refuses flu vaccine - Return to clinic in 3 mo with sugar  log  Office Visit on 01/14/2014  Component Date Value Ref Range Status  . Hgb A1c MFr Bld 01/14/2014 7.8* 4.6 - 6.5 % Final   Glycemic Control Guidelines for People with Diabetes:Non Diabetic:  <6%Goal of Therapy: <7%Additional Action Suggested:  >8%    Great improvement in HbA1c!

## 2014-01-14 NOTE — Patient Instructions (Signed)
Please continue: - Metformin 1000 mg 2x a day - Invokana 100 mg in am  Decrease: - Glipizide to 10 mg in am and 5 before dinner  Start Bydureon 2 mg on Sundays.  Please stop at the lab.  Please come back for a follow-up appointment in 3 months

## 2014-01-14 NOTE — Progress Notes (Signed)
Pre visit review using our clinic review tool, if applicable. No additional management support is needed unless otherwise documented below in the visit note. 

## 2014-01-17 LAB — HEMOGLOBIN A1C: Hgb A1c MFr Bld: 7.8 % — ABNORMAL HIGH (ref 4.6–6.5)

## 2014-02-01 ENCOUNTER — Other Ambulatory Visit: Payer: Self-pay | Admitting: Internal Medicine

## 2014-02-05 ENCOUNTER — Encounter: Payer: Self-pay | Admitting: Family Medicine

## 2014-02-05 ENCOUNTER — Encounter: Payer: Self-pay | Admitting: Cardiovascular Disease

## 2014-02-07 ENCOUNTER — Telehealth: Payer: Self-pay | Admitting: Cardiovascular Disease

## 2014-02-07 ENCOUNTER — Encounter: Payer: Self-pay | Admitting: Cardiovascular Disease

## 2014-02-07 ENCOUNTER — Ambulatory Visit (INDEPENDENT_AMBULATORY_CARE_PROVIDER_SITE_OTHER): Payer: BC Managed Care – PPO | Admitting: *Deleted

## 2014-02-07 DIAGNOSIS — I480 Paroxysmal atrial fibrillation: Secondary | ICD-10-CM

## 2014-02-07 MED ORDER — RIVAROXABAN 20 MG PO TABS: 20.0000 mg | ORAL_TABLET | Freq: Every day | ORAL | Status: DC

## 2014-02-07 NOTE — Telephone Encounter (Signed)
Pt need some samples of Xarelto 20 mg please.

## 2014-02-07 NOTE — Telephone Encounter (Signed)
Follow up      You do not have to call---pt figured out how to check her remote from home.

## 2014-02-07 NOTE — Telephone Encounter (Signed)
Samples ordered via MyChart message encounter from patient. Spoke with patient and told her samples were available to pick up

## 2014-02-07 NOTE — Telephone Encounter (Signed)
New message     Pt is trying to do a device check from home and does not know how to do it.  Please call

## 2014-02-08 MED ORDER — RIVAROXABAN 20 MG PO TABS: 20.0000 mg | ORAL_TABLET | Freq: Every day | ORAL | Status: DC

## 2014-02-08 NOTE — Progress Notes (Signed)
Remote pacemaker transmission.   

## 2014-02-10 LAB — MDC_IDC_ENUM_SESS_TYPE_REMOTE
Battery Remaining Longevity: 77 mo
Battery Remaining Percentage: 88 %
Battery Voltage: 2.98 V
Brady Statistic AS VP Percent: 98 %
Brady Statistic AS VS Percent: 1.5 %
Brady Statistic RA Percent Paced: 1 %
Date Time Interrogation Session: 20151130070017
Implantable Pulse Generator Serial Number: 2981305
Lead Channel Impedance Value: 410 Ohm
Lead Channel Impedance Value: 440 Ohm
Lead Channel Impedance Value: 450 Ohm
Lead Channel Pacing Threshold Amplitude: 0.75 V
Lead Channel Pacing Threshold Amplitude: 1 V
Lead Channel Pacing Threshold Amplitude: 1.25 V
Lead Channel Pacing Threshold Pulse Width: 0.4 ms
Lead Channel Pacing Threshold Pulse Width: 0.5 ms
Lead Channel Sensing Intrinsic Amplitude: 12 mV
Lead Channel Sensing Intrinsic Amplitude: 2 mV
Lead Channel Setting Pacing Amplitude: 2.25 V
Lead Channel Setting Pacing Amplitude: 2.5 V
Lead Channel Setting Pacing Pulse Width: 0.4 ms
Lead Channel Setting Pacing Pulse Width: 0.5 ms
Lead Channel Setting Sensing Sensitivity: 2 mV
MDC IDC MSMT LEADCHNL RA PACING THRESHOLD PULSEWIDTH: 0.4 ms
MDC IDC SET LEADCHNL RA PACING AMPLITUDE: 2 V
MDC IDC STAT BRADY AP VP PERCENT: 1 %
MDC IDC STAT BRADY AP VS PERCENT: 1 %

## 2014-02-14 ENCOUNTER — Encounter: Payer: Self-pay | Admitting: Cardiology

## 2014-02-17 ENCOUNTER — Encounter (HOSPITAL_COMMUNITY): Payer: Self-pay | Admitting: Cardiology

## 2014-03-11 LAB — HM DIABETES EYE EXAM

## 2014-03-20 ENCOUNTER — Other Ambulatory Visit: Payer: Self-pay | Admitting: Cardiovascular Disease

## 2014-03-21 ENCOUNTER — Other Ambulatory Visit: Payer: Self-pay

## 2014-03-21 MED ORDER — LOSARTAN POTASSIUM 25 MG PO TABS: 25.0000 mg | ORAL_TABLET | Freq: Two times a day (BID) | ORAL | Status: DC

## 2014-03-21 NOTE — Telephone Encounter (Signed)
Rx sent to pharmacy   

## 2014-03-28 ENCOUNTER — Other Ambulatory Visit: Payer: Self-pay | Admitting: Internal Medicine

## 2014-03-30 ENCOUNTER — Other Ambulatory Visit: Payer: Self-pay | Admitting: Internal Medicine

## 2014-03-30 ENCOUNTER — Other Ambulatory Visit: Payer: Self-pay | Admitting: Cardiovascular Disease

## 2014-03-30 NOTE — Telephone Encounter (Signed)
Rx(s) sent to pharmacy electronically.  

## 2014-03-31 ENCOUNTER — Other Ambulatory Visit: Payer: Self-pay | Admitting: *Deleted

## 2014-03-31 ENCOUNTER — Encounter: Payer: Self-pay | Admitting: Cardiovascular Disease

## 2014-03-31 MED ORDER — METFORMIN HCL 1000 MG PO TABS: ORAL_TABLET | ORAL | Status: DC

## 2014-04-05 ENCOUNTER — Encounter: Payer: Self-pay | Admitting: Cardiovascular Disease

## 2014-04-07 ENCOUNTER — Telehealth: Payer: Self-pay | Admitting: *Deleted

## 2014-04-07 ENCOUNTER — Telehealth: Payer: Self-pay | Admitting: Cardiovascular Disease

## 2014-04-07 ENCOUNTER — Other Ambulatory Visit (INDEPENDENT_AMBULATORY_CARE_PROVIDER_SITE_OTHER): Payer: BLUE CROSS/BLUE SHIELD

## 2014-04-07 DIAGNOSIS — Z0000 Encounter for general adult medical examination without abnormal findings: Secondary | ICD-10-CM

## 2014-04-07 LAB — CBC WITH DIFFERENTIAL/PLATELET
BASOS PCT: 0.3 % (ref 0.0–3.0)
Basophils Absolute: 0 10*3/uL (ref 0.0–0.1)
EOS PCT: 2 % (ref 0.0–5.0)
Eosinophils Absolute: 0.1 10*3/uL (ref 0.0–0.7)
HCT: 36.7 % (ref 36.0–46.0)
HEMOGLOBIN: 12.4 g/dL (ref 12.0–15.0)
LYMPHS ABS: 1.7 10*3/uL (ref 0.7–4.0)
Lymphocytes Relative: 24.3 % (ref 12.0–46.0)
MCHC: 33.9 g/dL (ref 30.0–36.0)
MCV: 88.1 fl (ref 78.0–100.0)
MONO ABS: 0.3 10*3/uL (ref 0.1–1.0)
MONOS PCT: 5 % (ref 3.0–12.0)
Neutro Abs: 4.8 10*3/uL (ref 1.4–7.7)
Neutrophils Relative %: 68.4 % (ref 43.0–77.0)
PLATELETS: 240 10*3/uL (ref 150.0–400.0)
RBC: 4.16 Mil/uL (ref 3.87–5.11)
RDW: 13.6 % (ref 11.5–15.5)
WBC: 7 10*3/uL (ref 4.0–10.5)

## 2014-04-07 LAB — POCT URINALYSIS DIPSTICK
Bilirubin, UA: NEGATIVE
Blood, UA: NEGATIVE
KETONES UA: NEGATIVE
Leukocytes, UA: NEGATIVE
Nitrite, UA: NEGATIVE
PH UA: 5.5
Protein, UA: NEGATIVE
Spec Grav, UA: 1.015
UROBILINOGEN UA: 0.2

## 2014-04-07 LAB — HEMOGLOBIN A1C: Hgb A1c MFr Bld: 6.8 % — ABNORMAL HIGH (ref 4.6–6.5)

## 2014-04-07 LAB — BASIC METABOLIC PANEL
BUN: 18 mg/dL (ref 6–23)
CALCIUM: 10.1 mg/dL (ref 8.4–10.5)
CO2: 26 mEq/L (ref 19–32)
Chloride: 103 mEq/L (ref 96–112)
Creatinine, Ser: 0.73 mg/dL (ref 0.40–1.20)
GFR: 85.83 mL/min (ref >60.00)
Glucose, Bld: 143 mg/dL — ABNORMAL HIGH (ref 70–99)
POTASSIUM: 5 meq/L (ref 3.5–5.1)
Sodium: 139 mEq/L (ref 135–145)

## 2014-04-07 LAB — LIPID PANEL
CHOL/HDL RATIO: 2
CHOLESTEROL: 110 mg/dL (ref 0–200)
HDL: 55.7 mg/dL (ref >39.00)
LDL Cholesterol: 38 mg/dL (ref 0–99)
NonHDL: 54.3
Triglycerides: 82 mg/dL (ref 0.0–149.0)
VLDL: 16.4 mg/dL (ref 0.0–40.0)

## 2014-04-07 LAB — MICROALBUMIN / CREATININE URINE RATIO
CREATININE, U: 13.5 mg/dL
Microalb Creat Ratio: 5.2 mg/g (ref 0.0–30.0)

## 2014-04-07 LAB — HEPATIC FUNCTION PANEL
ALBUMIN: 4.5 g/dL (ref 3.5–5.2)
ALT: 19 U/L (ref 0–35)
AST: 13 U/L (ref 0–37)
Alkaline Phosphatase: 56 U/L (ref 39–117)
BILIRUBIN DIRECT: 0.1 mg/dL (ref 0.0–0.3)
TOTAL PROTEIN: 7.6 g/dL (ref 6.0–8.3)
Total Bilirubin: 0.6 mg/dL (ref 0.2–1.2)

## 2014-04-07 LAB — TSH: TSH: 1.59 u[IU]/mL (ref 0.35–4.50)

## 2014-04-07 NOTE — Telephone Encounter (Signed)
Spoke with [name], the received the clearance for the patient to have eye surgery but nothing was marked it was just signed. He is going to fax the paper back for completion.

## 2014-04-07 NOTE — Telephone Encounter (Signed)
Signed pacemaker clearance for right eye surgery faxed to [facility].

## 2014-04-07 NOTE — Telephone Encounter (Signed)
Fax returned with clearance marked.

## 2014-04-14 ENCOUNTER — Ambulatory Visit (INDEPENDENT_AMBULATORY_CARE_PROVIDER_SITE_OTHER): Payer: BLUE CROSS/BLUE SHIELD | Admitting: Family Medicine

## 2014-04-14 ENCOUNTER — Encounter: Payer: Self-pay | Admitting: Family Medicine

## 2014-04-14 ENCOUNTER — Telehealth: Payer: Self-pay | Admitting: Family Medicine

## 2014-04-14 VITALS — BP 120/80 | Temp 98.0°F | Ht 64.25 in | Wt 149.0 lb

## 2014-04-14 DIAGNOSIS — E1165 Type 2 diabetes mellitus with hyperglycemia: Secondary | ICD-10-CM

## 2014-04-14 DIAGNOSIS — I10 Essential (primary) hypertension: Secondary | ICD-10-CM

## 2014-04-14 DIAGNOSIS — Z0000 Encounter for general adult medical examination without abnormal findings: Secondary | ICD-10-CM

## 2014-04-14 DIAGNOSIS — Z23 Encounter for immunization: Secondary | ICD-10-CM

## 2014-04-14 DIAGNOSIS — I480 Paroxysmal atrial fibrillation: Secondary | ICD-10-CM

## 2014-04-14 DIAGNOSIS — IMO0002 Reserved for concepts with insufficient information to code with codable children: Secondary | ICD-10-CM

## 2014-04-14 DIAGNOSIS — Z853 Personal history of malignant neoplasm of breast: Secondary | ICD-10-CM

## 2014-04-14 NOTE — Progress Notes (Signed)
Subjective:    Patient ID: [name], female    DOB: 11-16-1950, 64 y.o.   MRN: [id number]  HPI [name] is a 64 year old married female nonsmoker who comes in today for general physical examination  She has a history of breast cancer and had both breast removed in 2002. Should bilateral TRAM flaps. Her primary was in her left breast but she elected to have both breasts removed. She's done well and has been released by oncology  2016 years ago she had a TAH and BSO for dysfunctional uterine bleeding and dysplasia of the cervix.  She gets routine eye care, dental care, does not do BSE monthly nor get annual mammography not necessary. Colonoscopy 2012 was normal  Medications unchanged. She also seen and followed by cardiology and endocrinology. She's done very well since she had her heart attack. She's also gotten better control of her diabetes and no longer smokes.  She says she has 2 concerns today. #1 is stiffness in her back when she stands for long period of time also she's noticed some shortness of breath with climbing stairs. She has no shortness of breath on a flat surface. She denies any chest pain. Shortness of breath started about a month ago. She's had no other pulmonary symptoms. She tells me she had a chest x-ray about 4 months ago which was normal.  She was given a flu shot and Pneumovax today. Information given on shingles     Review of Systems  Constitutional: Negative.   HENT: Negative.   Eyes: Negative.   Respiratory: Negative.   Cardiovascular: Negative.   Gastrointestinal: Negative.   Endocrine: Negative.   Genitourinary: Negative.   Musculoskeletal: Negative.   Skin: Negative.   Allergic/Immunologic: Negative.   Neurological: Negative.   Hematological: Negative.   Psychiatric/Behavioral: Negative.        Objective:   Physical Exam  Constitutional: She appears well-developed and well-nourished.  HENT:  Head: Normocephalic and atraumatic.  Right  Ear: External ear normal.  Left Ear: External ear normal.  Nose: Nose normal.  Mouth/Throat: Oropharynx is clear and moist.  Eyes: EOM are normal. Pupils are equal, round, and reactive to light.  Neck: Normal range of motion. Neck supple. No JVD present. No tracheal deviation present. No thyromegaly present.  Cardiovascular: Normal rate, regular rhythm, normal heart sounds and intact distal pulses.  Exam reveals no gallop and no friction rub.   No murmur heard. Heart sounds normal but distant  Pulmonary/Chest: Effort normal and breath sounds normal. No stridor. No respiratory distress. She has no wheezes. She has no rales. She exhibits no tenderness.  Abdominal: Soft. Bowel sounds are normal. She exhibits no distension and no mass. There is no tenderness. There is no rebound and no guarding.  Genitourinary:  Bilateral breast exam shows reconstruction with implants in a TRAM flap from her posterior chest wall. No palpable masses  Musculoskeletal: Normal range of motion.  Lymphadenopathy:    She has no cervical adenopathy.  Neurological: She is alert. She has normal reflexes. No cranial nerve deficit. She exhibits normal muscle tone. Coordination normal.  Skin: Skin is warm and dry. No rash noted. No erythema. No pallor.  Psychiatric: She has a normal mood and affect. Her behavior is normal. Judgment and thought content normal.  Nursing note and vitals reviewed.         Assessment & Plan:  Healthy female  Diabetes much improved A1c 6.8%  Hyperlipidemia on Lipitor 80 mg LDL cholesterol 38  X  smoker  Hypertension at goal BP 120/80 continue above medication  History of PAF on blood thinners,,x  Osteoarthritis of the spine.........Marland Kitchen Motrin 400 twice a day  Shortness of breath with exertion climbing stairs but not on a level playing field......Marland Kitchen because of her history I would recommend she see her cardiologist ASAP.

## 2014-04-14 NOTE — Progress Notes (Signed)
Pre visit review using our clinic review tool, if applicable. No additional management support is needed unless otherwise documented below in the visit note. Lab Results  Component Value Date   HGBA1C 6.8* 04/07/2014   HGBA1C 7.8* 01/14/2014   HGBA1C 9.4* 09/23/2013   Lab Results  Component Value Date   MICROALBUR <0.7 04/07/2014   LDLCALC 38 04/07/2014   CREATININE 0.73 04/07/2014

## 2014-04-14 NOTE — Telephone Encounter (Signed)
Pt said the earliest she can get in to see cardiologost is 05/03/14 She said to let Dr Sherren Mocha know

## 2014-04-14 NOTE — Patient Instructions (Addendum)
Continue current medications  Motrin 400 mg twice daily with food for low back pain  Call your cardiologist and asked to be seen sooner because of your shortness of breath  Return in one year for general physical exam sooner if any problems  He had the flu shot and a pneumonia vaccine today  Call your insurance company and find out where you can get the shingles vaccine. If you can get it here call and leave a voicemail with Apolonio Schneiders extension [phone]

## 2014-04-15 NOTE — Telephone Encounter (Signed)
Patient has an appointment 04/27/14 at 10 am and she is aware.

## 2014-04-18 ENCOUNTER — Ambulatory Visit (INDEPENDENT_AMBULATORY_CARE_PROVIDER_SITE_OTHER): Payer: BLUE CROSS/BLUE SHIELD | Admitting: Internal Medicine

## 2014-04-18 ENCOUNTER — Other Ambulatory Visit: Payer: Self-pay | Admitting: Internal Medicine

## 2014-04-18 ENCOUNTER — Encounter: Payer: Self-pay | Admitting: Internal Medicine

## 2014-04-18 VITALS — BP 108/62 | HR 100 | Temp 98.5°F | Resp 12 | Wt 148.0 lb

## 2014-04-18 DIAGNOSIS — IMO0002 Reserved for concepts with insufficient information to code with codable children: Secondary | ICD-10-CM

## 2014-04-18 DIAGNOSIS — E1165 Type 2 diabetes mellitus with hyperglycemia: Secondary | ICD-10-CM

## 2014-04-18 MED ORDER — EXENATIDE ER 2 MG ~~LOC~~ PEN: PEN_INJECTOR | SUBCUTANEOUS | Status: DC

## 2014-04-18 MED ORDER — GLIPIZIDE 5 MG PO TABS: ORAL_TABLET | ORAL | Status: DC

## 2014-04-18 NOTE — Progress Notes (Signed)
Patient ID: [name], female   DOB: Sep 07, 1950, 64 y.o.   MRN: [id number]  HPI: [name] is a 64 y.o.-year-old female, returning for f/u for DM2, dx 2002, insulin-independent, uncontrolled, without complications. Last visit 3 mo ago.  Last hemoglobin A1c was: Lab Results  Component Value Date   HGBA1C 6.8* 04/07/2014   HGBA1C 7.8* 01/14/2014   HGBA1C 9.4* 09/23/2013  Patient has been on insulin before >> hypolycemia.   Pt is on a regimen of: - Metformin 1000 mg po bid - Glipizide 10 mg bid >> 10 mg in am and 5 mg in pm - Invokana 100 mg (started 10/2013) - no yeast inf or UTIs. - Bydureon 2 mg weekly - started 01/2014 Could not afford Januvia 100 mg daily >> 100$ reduced from 348%. She was on Amaryl 2 mg bid.  Pt checks her sugars 1x a day and they are: - am: 56-140 >> 107-186, most 120-140 >> 170-200 >> 170-233 >> 135-140 - before lunch: 80-90 >> 70-191 >> 74 few times, 100-160 >> 130-220 >> 80-120 - before dinner: 56-60s but sometimes 102 >> 49-156 >> 100-150 >> 98-161 >> 115-130 - bedtime: 80-110 >> 60-112 >> n/c >> 63, 81 >> 100 Still has lows. Lowest sugar was 74 >> 63 after dinner x1; she has hypoglycemia awareness at 60. Highest sugar was 233.  She eats a lot of fruit. She started to walk for exercise. She is not as hungry after started Victoza.   - no CKD, last BUN/creatinine:  Lab Results  Component Value Date   BUN 18 04/07/2014   CREATININE 0.73 04/07/2014  she is on losartan. - last set of lipids: Lab Results  Component Value Date   CHOL 110 04/07/2014   HDL 55.70 04/07/2014   LDLCALC 38 04/07/2014   LDLDIRECT 134.3 09/19/2009   TRIG 82.0 04/07/2014   CHOLHDL 2 04/07/2014  she is on atorvastatin. - last eye exam was 03/2014. Has cataracts. No DR.  - no numbness and tingling in her feet. She is on aspirin 81.  ROS: Constitutional: no weight gain/loss, no fatigue, no subjective hyperthermia/hypothermia Eyes: no blurry vision, no  xerophthalmia ENT: no sore throat, no nodules palpated in throat, no dysphagia/odynophagia, no hoarseness Cardiovascular: no CP/+ SOB/no palpitations/leg swelling Respiratory: no cough/+ SOB Gastrointestinal: no N/V/D/C Musculoskeletal: no muscle/joint aches Skin: no rashes Neurological: no tremors/numbness/tingling/dizziness  I reviewed pt's medications, allergies, PMH, social hx, family hx, and changes were documented in the history of present illness. Otherwise, unchanged from my initial visit note.  PE: BP 108/62 mmHg  Pulse 100  Temp(Src) 98.5 F (36.9 C) (Oral)  Resp 12  Wt 148 lb (67.132 kg)  SpO2 97% Body mass index is 25.21 kg/(m^2).  Wt Readings from Last 3 Encounters:  04/18/14 148 lb (67.132 kg)  04/14/14 149 lb (67.586 kg)  01/14/14 152 lb 6.4 oz (69.128 kg)   Constitutional: normal weight, in NAD Eyes: PERRLA, EOMI, no exophthalmos ENT: moist mucous membranes, no thyromegaly, no cervical lymphadenopathy Cardiovascular: RRR, 1/6 SEM, no RG, no leg edema Respiratory: CTA B Gastrointestinal: abdomen soft, NT, ND, BS+ Musculoskeletal: no deformities, strength intact in all 4 Skin: moist, warm, no rashes Neurological: no tremor with outstretched hands, DTR normal in all 4  ASSESSMENT: 1. 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 Patient with long-standing, recently more uncontrolled diabetes, on  oral antidiabetic regimen. Her highest sugars are still in am, but sugars are much improved >> last HbA1c (checked few days ago) is great at 6.8%! - I advised her to decrease Glipizide as she may get low before lunch:  Patient Instructions  Please continue: - Metformin 1000 mg 2x a day - Invokana 100 mg in am  - Bydureon 2 mg on Sundays  Decrease: - Glipizide to 5 mg in am and 5 before dinner  Please come back for a follow-up  appointment in 3 months  - continue checking her sugars at different times of the day - check 2 times a day, rotating checks >> bring log at next visit - up to date with eye exams - given more sugar logs - refused flu vaccine - Return to clinic in 3 mo with sugar log

## 2014-04-18 NOTE — Patient Instructions (Signed)
Please continue: - Metformin 1000 mg 2x a day - Invokana 100 mg in am  - Bydureon 2 mg on Sundays  Decrease: - Glipizide to 5 mg in am and 5 before dinner  Please come back for a follow-up appointment in 3 months

## 2014-04-27 ENCOUNTER — Encounter: Payer: Self-pay | Admitting: Cardiovascular Disease

## 2014-04-27 ENCOUNTER — Ambulatory Visit (INDEPENDENT_AMBULATORY_CARE_PROVIDER_SITE_OTHER): Payer: BLUE CROSS/BLUE SHIELD | Admitting: Cardiovascular Disease

## 2014-04-27 VITALS — BP 152/80 | HR 88 | Ht 60.0 in | Wt 148.5 lb

## 2014-04-27 DIAGNOSIS — R0602 Shortness of breath: Secondary | ICD-10-CM | POA: Diagnosis not present

## 2014-04-27 DIAGNOSIS — I25810 Atherosclerosis of coronary artery bypass graft(s) without angina pectoris: Secondary | ICD-10-CM | POA: Diagnosis not present

## 2014-04-27 DIAGNOSIS — I10 Essential (primary) hypertension: Secondary | ICD-10-CM

## 2014-04-27 DIAGNOSIS — I5042 Chronic combined systolic (congestive) and diastolic (congestive) heart failure: Secondary | ICD-10-CM

## 2014-04-27 DIAGNOSIS — I5043 Acute on chronic combined systolic (congestive) and diastolic (congestive) heart failure: Secondary | ICD-10-CM | POA: Diagnosis not present

## 2014-04-27 DIAGNOSIS — I480 Paroxysmal atrial fibrillation: Secondary | ICD-10-CM

## 2014-04-27 DIAGNOSIS — Z951 Presence of aortocoronary bypass graft: Secondary | ICD-10-CM

## 2014-04-27 DIAGNOSIS — I255 Ischemic cardiomyopathy: Secondary | ICD-10-CM

## 2014-04-27 DIAGNOSIS — Z950 Presence of cardiac pacemaker: Secondary | ICD-10-CM

## 2014-04-27 LAB — BASIC METABOLIC PANEL
BUN: 16 mg/dL (ref 6–23)
CALCIUM: 10.2 mg/dL (ref 8.4–10.5)
CO2: 29 meq/L (ref 19–32)
CREATININE: 0.62 mg/dL (ref 0.50–1.10)
Chloride: 102 mEq/L (ref 96–112)
Glucose, Bld: 130 mg/dL — ABNORMAL HIGH (ref 70–99)
Potassium: 4.8 mEq/L (ref 3.5–5.3)
Sodium: 140 mEq/L (ref 135–145)

## 2014-04-27 NOTE — Progress Notes (Signed)
Patient ID: [name], female   DOB: 19-Dec-1950, 64 y.o.   MRN: [id number]     Reason for office visit Shortness of breath, CAD, ischemic cardiopathy, CRT-P  [name] has become physically more active since her last appointment and is focusing on her diet and weight loss. She has lost roughly 7 pounds since last August. Diabetes control has improved with a most recent hemoglobin A1c of 6.8%. She has been walking 1-1/2 miles a day. However since Christmas she has noticed a steady deterioration in her breathing. Independent of this she has noticed some discomfort in her back when she stands too long or does certain activities such as washing dishes. She does not have chest or back pain with walking. She denies lower showed edema. She had a severe episode of dizziness after bending over just a few days ago.  Interrogation of her pacemaker shows a single episode of arrhythmia: less than 3 minutes of paroxysmal atrial tachycardia with 1:1 conduction that occurred on December 5. Otherwise device function is normal and there is virtually 100% biventricular pacing.  Her last echocardiogram was performed in October 2014 and showed an ejection fraction of 35-40 percent, just before implantation of her biventricular pacemaker.  Interrogation of her pacemaker today shows normal function with 99% biventricular pacing and less than 1% atrial pacing. There have been 4 episodes of mode switch the longest lasting for under 3 minutes and appears to be paroxysmal atrial tachycardia with mostly 1:1 AV conduction. The [place] thoracic impedance does not suggest fluid overload, on the contrary the thoracic impedance has been steadily trending upward. Quick opt was performed for her RV-LV pacing settings and suggested only minor reprogramming settings. We did shorten her paced AV delay and sensed AV delay slightly. The electrocardiogram actually shows a very narrow paced QRS at only 110 ms suggesting that there is  effective biV synchronization.  She had a myocardial infarction in 2004 when living in [place], treated medically. In May 2014 she presented with atrial for relation with rapid ventricular response congestive heart failure and angina pectoris. She was found to have multivessel CAD and a left ventricular ejection fraction of 40%. At that time she had an intermittent left bundle branch block that was only present during rapid atrial fibrillation. This later seemed to become a permanent left bundle branch block. She underwent four-vessel bypass by Dr. Servando Snare in July 2014 (left internal mammary to the LAD, saphenous vein graft to the first and second OM, saphenous vein graft to the intermediate coronary artery.) with placement of a left ventricular epicardial pacing lead, clipping of the left atrial appendage and a surgical MAZE procedure. A biventricular pacemaker was implanted in 2014 ([facility]). She has a history of breast cancer on the left side followed by bilateral mastectomy and chemotherapy in 2002. No Known Allergies  Current Outpatient Prescriptions  Medication Sig Dispense Refill  . aspirin 81 MG tablet Take 1 tablet (81 mg total) by mouth daily. 30 tablet   . atorvastatin (LIPITOR) 80 MG tablet TAKE 1 TABLET (80 MG TOTAL) BY MOUTH DAILY. 30 tablet 8  . Canagliflozin (INVOKANA) 100 MG TABS Take 1 tablet by mouth daily in am 30 tablet 11  . carvedilol (COREG) 25 MG tablet TAKE 1 TABLET (25 MG TOTAL) BY MOUTH 2 (TWO) TIMES DAILY. 60 tablet 9  . Exenatide ER (BYDUREON) 2 MG PEN INJECT 2 MG ONCE A WEEK UNDER SKIN 12 each 1  . furosemide (LASIX) 40 MG tablet TAKE 1  TABLET (40 MG TOTAL) BY MOUTH DAILY. 30 tablet 7  . glipiZIDE (GLUCOTROL) 5 MG tablet TAKE 5 MG IN THE MORNING AND 5 MG IN THE EVENING 90 tablet 1  . glucose blood (ONETOUCH VERIO) test strip Check 2 times daily. Use as instructed 200 each 3  . losartan (COZAAR) 25 MG tablet Take 1 tablet (25 mg total) by mouth 2 (two) times  daily. 180 tablet 3  . metFORMIN (GLUCOPHAGE) 1000 MG tablet TAKE 1 TABLET (1,000 MG TOTAL) BY MOUTH 2 (TWO) TIMES DAILY WITH A MEAL. 60 tablet 1  . nitroGLYCERIN (NITROSTAT) 0.4 MG SL tablet Place 1 tablet (0.4 mg total) under the tongue every 5 (five) minutes as needed for chest pain. 25 tablet 3  . rivaroxaban (XARELTO) 20 MG TABS tablet Take 1 tablet (20 mg total) by mouth daily with supper. 60 tablet 0   No current facility-administered medications for this visit.    Past Medical History  Diagnosis Date  . Diabetes mellitus type II   . Hyperlipidemia   . Hypertension   . Allergic rhinitis   . Cataract   . CAD (coronary artery disease)     a. 2004: s/p MI in [place]. No PCI->Medical RX;  b. 07/2012 Cath: LM 30-40, LAD 70p, 70/49m, D1 80-90p, OM1 small 90p, OM2 large 80-90p, 80m, 70-80d, RCA 20-30 diff, EF 40%, glob HK.s/p CABG  . Ischemic cardiomyopathy     a. 07/2012 Echo: EF 35%, Sev inferoseptal HK, mildly dil LA, Peak PASP 38mmHg.  [name] PAF (paroxysmal atrial fibrillation)     a. 07/2012: Amio and xarelto initiated.  [name] LBBB (left bundle branch block)     a. intermittent - present during rapid afib 07/2012.  . Breast cancer 2002    Patient reports left breast cancer diagnosis in 2002 treated with bilateral mastectomy positive lymph nodes with left axillary dissection followed by chemotherapy of unknown type  . Neuromuscular disorder     Patient reports chronic numbness in the right foot related to previous surgery on the right leg and "nerve damage"  . Shortness of breath   . Biventricular cardiac pacemaker - St Jude, Nov 2014 03/04/2013  . SBO (small bowel obstruction)     Past Surgical History  Procedure Laterality Date  . Mastectomy  2002    bilateral  . Tubal ligation  1987  . Bladder surgery  2000  . Right leg surgery  2001    torn ligaments and tendons x4 surgeries  . Appendectomy  1974  . Cholecystectomy  1974  . Abdominal hysterectomy  2000  . Breast surgery       Bilateral mastectomy for left breast cancer unknown stage but patient reports positive nodes was treated with chemotherapy  . Abdominal hysterectomy    . Coronary artery bypass graft N/A 09/28/2012    Procedure: CORONARY ARTERY BYPASS GRAFTING (CABG);  Surgeon: [name], MD;  Location: [facility];  Service: Open Heart Surgery;  Laterality: N/A;  CABG x four, using left internal mammary artery and left leg greater saphenous vein harvested endoscopically  . Maze N/A 09/28/2012    Procedure: MAZE;  Surgeon: [name], MD;  Location: [facility];  Service: Open Heart Surgery;  Laterality: N/A;  . Intraoperative transesophageal echocardiogram N/A 09/28/2012    Procedure: INTRAOPERATIVE TRANSESOPHAGEAL ECHOCARDIOGRAM;  Surgeon: [name], MD;  Location: [facility];  Service: Open Heart Surgery;  Laterality: N/A;  . Epicardial pacing lead placement N/A 09/28/2012    Procedure: EPICARDIAL PACING  LEAD PLACEMENT;  Surgeon: [name], MD;  Location: [facility];  Service: Thoracic;  Laterality: N/A;  LV LEAD PLACEMENT  . Bi-ventricular pacemaker insertion (crt-p)  01/25/2013    Dr Lovena Le ([facility])  . Left heart catheterization with coronary angiogram N/A 07/20/2012    Procedure: LEFT HEART CATHETERIZATION WITH CORONARY ANGIOGRAM;  Surgeon: [name], MD;  Location: [facility] CATH LAB;  Service: Cardiovascular;  Laterality: N/A;  . Bi-ventricular pacemaker insertion N/A 01/25/2013    Procedure: BI-VENTRICULAR PACEMAKER INSERTION (CRT-P);  Surgeon: Evans Lance, MD;  Location: [facility] CATH LAB;  Service: Cardiovascular;  Laterality: N/A;    Family History  Problem Relation Age of Onset  . Kidney failure Mother   . Heart disease Mother     Died mid 41L complications of diabetes  . Arthritis Other   . Cancer Other     colon, ovarian  . Diabetes Other   . Hyperlipidemia Other   . Kidney failure Other   . Colon cancer Sister     History   Social History  . Marital Status: Married    Spouse Name: N/A    . Number of Children: 3  . Years of Education: N/A   Occupational History  . Office work    Social History Main Topics  . Smoking status: Former Smoker -- 1.00 packs/day for 10 years    Types: Cigarettes    Quit date: 03/11/2012  . Smokeless tobacco: Never Used     Comment: Quit in January  . Alcohol Use: No  . Drug Use: No  . Sexual Activity:    Partners: Female   Other Topics Concern  . Not on file   Social History Narrative   Lives with husband and mother in law.     Regular exercise: walking   Caffeine use: 2 cups of coffee in the morning    Review of systems:  exertional dyspnea, NYHA class II, worsened from last visit. His comfort in her back with prolonged standing. Dizziness with changes in position The patient specifically denies any chest pain at rest or with exertion, dyspnea at rest or with exertion, orthopnea, paroxysmal nocturnal dyspnea, syncope, palpitations, focal neurological deficits, intermittent claudication, lower extremity edema, unexplained weight gain, cough, hemoptysis or wheezing.  The patient also denies abdominal pain, nausea, vomiting, dysphagia, diarrhea, constipation, polyuria, polydipsia, dysuria, hematuria, frequency, urgency, abnormal bleeding or bruising, fever, chills, unexpected weight changes, mood swings, change in skin or hair texture, change in voice quality, auditory or visual problems, allergic reactions or rashes, new musculoskeletal complaints other than usual "aches and pains".   PHYSICAL EXAM BP 152/80 mmHg  Pulse 88  Ht 5' (1.524 m)  Wt 67.359 kg (148 lb 8 oz)  BMI 29.00 kg/m2 General: Alert, oriented x3, no distress Head: no evidence of trauma, PERRL, EOMI, no exophtalmos or lid lag, no myxedema, no xanthelasma; normal ears, nose and oropharynx Neck: normal jugular venous pulsations and no hepatojugular reflux; brisk carotid pulses without delay and no carotid bruits Chest: clear to auscultation, no signs of consolidation  by percussion or palpation, normal fremitus, symmetrical and full respiratory excursions; healthy sternotomy and pacemaker scar Cardiovascular: normal position and quality of the apical impulse, regular rhythm, normal first and paradoxically split second heart sounds, no murmurs, rubs or gallops Abdomen: no tenderness or distention, no masses by palpation, no abnormal pulsatility or arterial bruits, normal bowel sounds, no hepatosplenomegaly Extremities: no clubbing, cyanosis or edema; 2+ radial, ulnar and brachial pulses bilaterally; 2+ right  femoral, posterior tibial and dorsalis pedis pulses; 2+ left femoral, posterior tibial and dorsalis pedis pulses; no subclavian or femoral bruits Neurological: grossly nonfocal  EKG: atrial safe ventricular paced (sinus rhythm with biventricular pacing)  Lipid Panel     Component Value Date/Time   CHOL 110 04/07/2014 0820   TRIG 82.0 04/07/2014 0820   HDL 55.70 04/07/2014 0820   CHOLHDL 2 04/07/2014 0820   VLDL 16.4 04/07/2014 0820   LDLCALC 38 04/07/2014 0820   LDLDIRECT 134.3 09/19/2009 1436    BMET    Component Value Date/Time   NA 139 04/07/2014 0820   K 5.0 04/07/2014 0820   CL 103 04/07/2014 0820   CO2 26 04/07/2014 0820   GLUCOSE 143* 04/07/2014 0820   BUN 18 04/07/2014 0820   CREATININE 0.73 04/07/2014 0820   CREATININE 0.72 11/25/2012 1628   CALCIUM 10.1 04/07/2014 0820   GFRNONAA >90 03/31/2013 0145   GFRAA >90 03/31/2013 0145     ASSESSMENT AND PLAN  Chronic combined systolic and diastolic heart failure due to ischemic cardiomyopathy Has some recent worsening of her shortness of breath, but by physical exam there is little to suggest hypervolemia. Her thoracic impedance is actually heading upwards, suggesting she is getting drier not more fluid overloaded. Recheck echo, BNP. No changes recommended to her diuretic regimen.  CRT-P Normal device function. We tightened her sensed and paced AV delays today, but honestly I doubt  that this will make a clinical difference. Consider formal echo guided AV optimization. Recheck in clinic in one month after these changes  CAD s/p CABG Consider myocardial perfusion study if we can find an answer to her complaints. they might be an "angina equivalent".  PAF s/p surgical MAZE and left atrial appendage clipping  No recent recurrence. On appropriate anticoagulant  DM type 2 Markedly improved glycemic control since she started seeing her current endocrinologist. Mrs. Schweigert also seems much more dedicated to taking care of her own health.  Orders Placed This Encounter  Procedures  . DG Chest 2 View  . Basic Metabolic Panel (BMET)  . B Nat Peptide  . EKG 12-Lead  . 2D Echocardiogram without contrast   No orders of the defined types were placed in this encounter.    Holli Humbles, MD, [place] [phone] office [phone] pager

## 2014-04-27 NOTE — Patient Instructions (Signed)
Your physician recommends that you return for lab work  A chest x-ray takes a picture of the organs and structures inside the chest, including the heart, lungs, and blood vessels. This test can show several things, including, whether the heart is enlarges; whether fluid is building up in the lungs; and whether pacemaker / defibrillator leads are still in place.  Your physician has requested that you have an echocardiogram. Echocardiography is a painless test that uses sound waves to create images of your heart. It provides your doctor with information about the size and shape of your heart and how well your heart's chambers and valves are working. This procedure takes approximately one hour. There are no restrictions for this procedure.  Please keep your follow up appointment as scheduled with Dr.Croitoru

## 2014-04-28 LAB — BRAIN NATRIURETIC PEPTIDE: Brain Natriuretic Peptide: 58.1 pg/mL (ref 0.0–100.0)

## 2014-05-03 ENCOUNTER — Ambulatory Visit: Payer: BLUE CROSS/BLUE SHIELD | Admitting: Physician Assistant

## 2014-05-03 ENCOUNTER — Telehealth (HOSPITAL_COMMUNITY): Payer: Self-pay | Admitting: *Deleted

## 2014-05-06 ENCOUNTER — Ambulatory Visit
Admission: RE | Admit: 2014-05-06 | Discharge: 2014-05-06 | Disposition: A | Discharge status: Home / Self Care | Payer: BLUE CROSS/BLUE SHIELD | Source: Ambulatory Visit | Attending: Cardiovascular Disease | Admitting: Cardiovascular Disease

## 2014-05-06 DIAGNOSIS — R0602 Shortness of breath: Secondary | ICD-10-CM

## 2014-05-09 ENCOUNTER — Ambulatory Visit (HOSPITAL_COMMUNITY)
Admission: RE | Admit: 2014-05-09 | Discharge: 2014-05-09 | Disposition: A | Discharge status: Home / Self Care | Payer: BLUE CROSS/BLUE SHIELD | Source: Ambulatory Visit | Attending: Cardiovascular Disease | Admitting: Cardiovascular Disease

## 2014-05-09 DIAGNOSIS — R0602 Shortness of breath: Secondary | ICD-10-CM

## 2014-05-09 LAB — MDC_IDC_ENUM_SESS_TYPE_INCLINIC
Battery Remaining Percentage: 95 %
Brady Statistic RA Percent Paced: 1 % — CL
Brady Statistic RV Percent Paced: 99 %
Implantable Pulse Generator Model: 3222
Lead Channel Impedance Value: 440 Ohm
Lead Channel Impedance Value: 450 Ohm
Lead Channel Pacing Threshold Amplitude: 0.75 V
Lead Channel Pacing Threshold Pulse Width: 0.4 ms
Lead Channel Pacing Threshold Pulse Width: 0.4 ms
Lead Channel Pacing Threshold Pulse Width: 0.5 ms
Lead Channel Sensing Intrinsic Amplitude: 12 mV
Lead Channel Sensing Intrinsic Amplitude: 2.1 mV
Lead Channel Setting Pacing Amplitude: 2.25 V
Lead Channel Setting Pacing Pulse Width: 0.4 ms
Lead Channel Setting Sensing Sensitivity: 2 mV
MDC IDC MSMT BATTERY VOLTAGE: 2.98 V
MDC IDC MSMT LEADCHNL LV IMPEDANCE VALUE: 410 Ohm
MDC IDC MSMT LEADCHNL LV PACING THRESHOLD AMPLITUDE: 1.5 V
MDC IDC MSMT LEADCHNL RV PACING THRESHOLD AMPLITUDE: 1 V
MDC IDC PG SERIAL: 2981305
MDC IDC SET LEADCHNL LV PACING PULSEWIDTH: 0.5 ms
MDC IDC SET LEADCHNL RA PACING AMPLITUDE: 2 V
MDC IDC SET LEADCHNL RV PACING AMPLITUDE: 2.5 V

## 2014-05-09 NOTE — Progress Notes (Signed)
2D Echo Performed 05/09/2014    Marygrace Drought, RCS

## 2014-05-16 ENCOUNTER — Encounter: Payer: Self-pay | Admitting: Cardiovascular Disease

## 2014-05-24 ENCOUNTER — Encounter: Payer: Self-pay | Admitting: Cardiovascular Disease

## 2014-05-24 ENCOUNTER — Ambulatory Visit (INDEPENDENT_AMBULATORY_CARE_PROVIDER_SITE_OTHER): Payer: BLUE CROSS/BLUE SHIELD | Admitting: Cardiovascular Disease

## 2014-05-24 VITALS — BP 104/66 | HR 88 | Resp 16 | Ht 64.5 in | Wt 147.7 lb

## 2014-05-24 DIAGNOSIS — I5043 Acute on chronic combined systolic (congestive) and diastolic (congestive) heart failure: Secondary | ICD-10-CM

## 2014-05-24 DIAGNOSIS — I5042 Chronic combined systolic (congestive) and diastolic (congestive) heart failure: Secondary | ICD-10-CM

## 2014-05-24 DIAGNOSIS — I480 Paroxysmal atrial fibrillation: Secondary | ICD-10-CM | POA: Diagnosis not present

## 2014-05-24 DIAGNOSIS — Z951 Presence of aortocoronary bypass graft: Secondary | ICD-10-CM

## 2014-05-24 DIAGNOSIS — I255 Ischemic cardiomyopathy: Secondary | ICD-10-CM

## 2014-05-24 DIAGNOSIS — I25810 Atherosclerosis of coronary artery bypass graft(s) without angina pectoris: Secondary | ICD-10-CM

## 2014-05-24 DIAGNOSIS — I10 Essential (primary) hypertension: Secondary | ICD-10-CM

## 2014-05-24 DIAGNOSIS — I252 Old myocardial infarction: Secondary | ICD-10-CM

## 2014-05-24 LAB — MDC_IDC_ENUM_SESS_TYPE_INCLINIC
Battery Remaining Longevity: 76.8 mo
Brady Statistic RV Percent Paced: 99.97 %
Date Time Interrogation Session: 20160315131311
Implantable Pulse Generator Model: 3222
Lead Channel Impedance Value: 400 Ohm
Lead Channel Impedance Value: 437.5 Ohm
Lead Channel Impedance Value: 475 Ohm
Lead Channel Pacing Threshold Amplitude: 0.75 V
Lead Channel Pacing Threshold Amplitude: 1 V
Lead Channel Pacing Threshold Amplitude: 1 V
Lead Channel Pacing Threshold Amplitude: 1.25 V
Lead Channel Pacing Threshold Pulse Width: 0.4 ms
Lead Channel Pacing Threshold Pulse Width: 0.4 ms
Lead Channel Pacing Threshold Pulse Width: 0.5 ms
Lead Channel Sensing Intrinsic Amplitude: 12 mV
Lead Channel Sensing Intrinsic Amplitude: 2 mV
Lead Channel Setting Pacing Amplitude: 2 V
Lead Channel Setting Pacing Amplitude: 2.25 V
Lead Channel Setting Pacing Pulse Width: 0.4 ms
Lead Channel Setting Pacing Pulse Width: 0.5 ms
MDC IDC MSMT BATTERY VOLTAGE: 2.98 V
MDC IDC MSMT LEADCHNL LV PACING THRESHOLD AMPLITUDE: 1.25 V
MDC IDC MSMT LEADCHNL LV PACING THRESHOLD PULSEWIDTH: 0.5 ms
MDC IDC MSMT LEADCHNL RA PACING THRESHOLD AMPLITUDE: 0.75 V
MDC IDC MSMT LEADCHNL RA PACING THRESHOLD PULSEWIDTH: 0.4 ms
MDC IDC MSMT LEADCHNL RV PACING THRESHOLD PULSEWIDTH: 0.4 ms
MDC IDC PG SERIAL: 2981305
MDC IDC SET LEADCHNL RV PACING AMPLITUDE: 2.5 V
MDC IDC SET LEADCHNL RV SENSING SENSITIVITY: 2 mV
MDC IDC STAT BRADY RA PERCENT PACED: 0 %

## 2014-05-24 NOTE — Patient Instructions (Addendum)
Remote monitoring is used to monitor your pacemaker from home. This monitoring reduces the number of office visits required to check your device to one time per year. It allows [name] to keep an eye on the functioning of your device to ensure it is working properly. You are scheduled for a device check from home on 08-25-2014. You may send your transmission at any time that day. If you have a wireless device, the transmission will be sent automatically. After your physician reviews your transmission, you will receive a postcard with your next transmission date.  Your physician has recommended you make the following change in your medication:  STOP furosemide.  STOP atorvastatin.   Please call [name] in 1 month and let [name] know how you are feeling.   Please continue to weigh yourself daily  Your physician recommends that you schedule a follow-up appointment in: 2 months with Dr. Loletha Grayer

## 2014-05-24 NOTE — Progress Notes (Signed)
Patient ID: [name], female   DOB: [birth year], 64 y.o.   MRN: [id number]     Cardiology Office Note   Date:  05/24/2014   ID:  Deshonna Trnka, DOB 11-14-1950, MRN [id number]  PCP:  Joycelyn Man, MD  Cardiologist:   [name], MD   Chief Complaint  Patient presents with  . 1 month visit    c/o SOB (had to sit about 15 minutes after walking into grocery store); c/o "bone pain" in arms and legs; occasional bilteral LE edema when she wakes up - no weight changes      History of Present Illness: [name] is a 64 y.o. female who presents for follow up with complaints of exertional fatigue in the setting of ischemic cardiomyopathy and combined systolic and diastolic heart failure. She also complains of "bone pain" especially prominent in her arms and legs itch limits her ability to even do grocery shopping.  Her echocardiogram actually shows improvement in left ventricular systolic function which is now just slightly below normal. Doppler parameters did not sit his volume overload. Her BNP is very low at only 58. Her CRT-P device thoracic impedance curve suggests that she is actually hypovolemic over the last couple of months. This coincides with the initiation of treatment with Invokana. Taken in aggregate these findings all suggest that she is probably hypovolemic due to the diuretic effect of her new diabetes medication. This can possibly explain her fatigue. Her myalgias and arthralgias may be related to statin therapy.  [name] has a long-standing history of coronary artery disease and its complications. She had an initial presentation with myocardial infarction in 2004 and then in May 2014 presented with congestive heart failure and atrial fibrillation in the setting of three-vessel coronary artery disease. She underwent bypass surgery (July 2014, Dr. Servando Snare, LIMA to LAD, SVG to ramus intermedius, sequential SVG to OM1 and OM 2, surgical maze procedure) but had  persistent depressed left ventricular systolic function and received a dual-chamber biventricular pacemaker (St. Jude Allure, Dr. Lovena Le in November 2014). She has type 2 diabetes mellitus that has been difficult to control as well as hypertension and hyperlipidemia. In generates 2015 she was admitted with acute on chronic heart failure due to excessive intravenous fluid administration during an episode of small bowel obstruction. Last ejection fraction assessment was before she received her CRT-P device showed an EF of 35-40%. Follow-up echo February 2016 shows left ventricular ejection fraction 50-55 percent.  Past Medical History  Diagnosis Date  . Diabetes mellitus type II   . Hyperlipidemia   . Hypertension   . Allergic rhinitis   . Cataract   . CAD (coronary artery disease)     a. 2004: s/p MI in [place]. No PCI->Medical RX;  b. 07/2012 Cath: LM 30-40, LAD 70p, 70/26m, D1 80-90p, OM1 small 90p, OM2 large 80-90p, 32m, 70-80d, RCA 20-30 diff, EF 40%, glob HK.s/p CABG  . Ischemic cardiomyopathy     a. 07/2012 Echo: EF 35%, Sev inferoseptal HK, mildly dil LA, Peak PASP 14mmHg.  Marland Kitchen PAF (paroxysmal atrial fibrillation)     a. 07/2012: Amio and xarelto initiated.  Marland Kitchen LBBB (left bundle branch block)     a. intermittent - present during rapid afib 07/2012.  . Breast cancer 2002    Patient reports left breast cancer diagnosis in 2002 treated with bilateral mastectomy positive lymph nodes with left axillary dissection followed by chemotherapy of unknown type  . Neuromuscular disorder     Patient reports chronic numbness in  the right foot related to previous surgery on the right leg and "nerve damage"  . Shortness of breath   . Biventricular cardiac pacemaker - St Jude, Nov 2014 03/04/2013  . SBO (small bowel obstruction)     Past Surgical History  Procedure Laterality Date  . Mastectomy  2002    bilateral  . Tubal ligation  1987  . Bladder surgery  2000  . Right leg surgery  2001    torn  ligaments and tendons x4 surgeries  . Appendectomy  1974  . Cholecystectomy  1974  . Abdominal hysterectomy  2000  . Breast surgery      Bilateral mastectomy for left breast cancer unknown stage but patient reports positive nodes was treated with chemotherapy  . Abdominal hysterectomy    . Coronary artery bypass graft N/A 09/28/2012    Procedure: CORONARY ARTERY BYPASS GRAFTING (CABG);  Surgeon: [name], MD;  Location: [facility];  Service: Open Heart Surgery;  Laterality: N/A;  CABG x four, using left internal mammary artery and left leg greater saphenous vein harvested endoscopically  . Maze N/A 09/28/2012    Procedure: MAZE;  Surgeon: [name], MD;  Location: [facility];  Service: Open Heart Surgery;  Laterality: N/A;  . Intraoperative transesophageal echocardiogram N/A 09/28/2012    Procedure: INTRAOPERATIVE TRANSESOPHAGEAL ECHOCARDIOGRAM;  Surgeon: [name], MD;  Location: [facility];  Service: Open Heart Surgery;  Laterality: N/A;  . Epicardial pacing lead placement N/A 09/28/2012    Procedure: EPICARDIAL PACING LEAD PLACEMENT;  Surgeon: [name], MD;  Location: [facility];  Service: Thoracic;  Laterality: N/A;  LV LEAD PLACEMENT  . Bi-ventricular pacemaker insertion (crt-p)  01/25/2013    Dr Lovena Le (STJ)  . Left heart catheterization with coronary angiogram N/A 07/20/2012    Procedure: LEFT HEART CATHETERIZATION WITH CORONARY ANGIOGRAM;  Surgeon: [name], MD;  Location: [facility] CATH LAB;  Service: Cardiovascular;  Laterality: N/A;  . Bi-ventricular pacemaker insertion N/A 01/25/2013    Procedure: BI-VENTRICULAR PACEMAKER INSERTION (CRT-P);  Surgeon: Evans Lance, MD;  Location: [facility] CATH LAB;  Service: Cardiovascular;  Laterality: N/A;     Current Outpatient Prescriptions  Medication Sig Dispense Refill  . aspirin 81 MG tablet Take 1 tablet (81 mg total) by mouth daily. 30 tablet   . Canagliflozin (INVOKANA) 100 MG TABS Take 1 tablet by mouth daily in am 30 tablet 11    . carvedilol (COREG) 25 MG tablet TAKE 1 TABLET (25 MG TOTAL) BY MOUTH 2 (TWO) TIMES DAILY. 60 tablet 9  . Exenatide ER (BYDUREON) 2 MG PEN INJECT 2 MG ONCE A WEEK UNDER SKIN 12 each 1  . glipiZIDE (GLUCOTROL) 5 MG tablet TAKE 5 MG IN THE MORNING AND 5 MG IN THE EVENING 90 tablet 1  . glucose blood (ONETOUCH VERIO) test strip Check 2 times daily. Use as instructed 200 each 3  . losartan (COZAAR) 25 MG tablet Take 1 tablet (25 mg total) by mouth 2 (two) times daily. 180 tablet 3  . metFORMIN (GLUCOPHAGE) 1000 MG tablet TAKE 1 TABLET (1,000 MG TOTAL) BY MOUTH 2 (TWO) TIMES DAILY WITH A MEAL. 60 tablet 1  . nitroGLYCERIN (NITROSTAT) 0.4 MG SL tablet Place 1 tablet (0.4 mg total) under the tongue every 5 (five) minutes as needed for chest pain. 25 tablet 3  . rivaroxaban (XARELTO) 20 MG TABS tablet Take 1 tablet (20 mg total) by mouth daily with supper. 60 tablet 0   No current facility-administered medications  for this visit.    Allergies:   Review of patient's allergies indicates no known allergies.    Social History:  The patient  reports that she quit smoking about 2 years ago. Her smoking use included Cigarettes. She has a 10 pack-year smoking history. She has never used smokeless tobacco. She reports that she does not drink alcohol or use illicit drugs.   Family History:  The patient's family history includes Arthritis in her other; Cancer in her other; Colon cancer in her sister; Diabetes in her other; Heart disease in her mother; Hyperlipidemia in her other; Kidney failure in her mother and other.    ROS:  Please see the history of present illness.  exertional fatigue, NYHA class II. Discomfort in her back with prolonged standing. Arm and  leg pain .Dizziness with changes in position The patient specifically denies any chest pain at rest or with exertion, dyspnea at rest or with exertion, orthopnea, paroxysmal nocturnal dyspnea, syncope, palpitations, focal neurological deficits,  intermittent claudication, lower extremity edema, unexplained weight gain, cough, hemoptysis or wheezing.  The patient also denies abdominal pain, nausea, vomiting, dysphagia, diarrhea, constipation, polyuria, polydipsia, dysuria, hematuria, frequency, urgency, abnormal bleeding or bruising, fever, chills, unexpected weight changes, mood swings, change in skin or hair texture, change in voice quality, auditory or visual problems, allergic reactions or rashes, new musculoskeletal complaints other than usual "aches and pains". Otherwise, review of systems are positive for none.   All other systems are reviewed and negative.    PHYSICAL EXAM: VS:  BP 104/66 mmHg  Pulse 88  Resp 16  Ht 5' 4.5" (1.638 m)  Wt 147 lb 11.2 oz (66.996 kg)  BMI 24.97 kg/m2 , BMI Body mass index is 24.97 kg/(m^2). General: Alert, oriented x3, no distress Head: no evidence of trauma, PERRL, EOMI, no exophtalmos or lid lag, no myxedema, no xanthelasma; normal ears, nose and oropharynx Neck: normal jugular venous pulsations and no hepatojugular reflux; brisk carotid pulses without delay and no carotid bruits Chest: clear to auscultation, no signs of consolidation by percussion or palpation, normal fremitus, symmetrical and full respiratory excursions; healthy sternotomy and pacemaker scar Cardiovascular: normal position and quality of the apical impulse, regular rhythm, normal first and paradoxically split second heart sounds, no murmurs, rubs or gallops Abdomen: no tenderness or distention, no masses by palpation, no abnormal pulsatility or arterial bruits, normal bowel sounds, no hepatosplenomegaly Extremities: no clubbing, cyanosis or edema; 2+ radial, ulnar and brachial pulses bilaterally; 2+ right femoral, posterior tibial and dorsalis pedis pulses; 2+ left femoral, posterior tibial and dorsalis pedis pulses; no subclavian or femoral bruits Neurological: grossly nonfocal   EKG:  EKG is not ordered today.   Recent  Labs: 04/07/2014: ALT 19; Hemoglobin 12.4; Platelets 240.0; TSH 1.59 04/27/2014: BUN 16; Creatinine 0.62; Potassium 4.8; Sodium 140    Lipid Panel    Component Value Date/Time   CHOL 110 04/07/2014 0820   TRIG 82.0 04/07/2014 0820   HDL 55.70 04/07/2014 0820   CHOLHDL 2 04/07/2014 0820   VLDL 16.4 04/07/2014 0820   LDLCALC 38 04/07/2014 0820   LDLDIRECT 134.3 09/19/2009 1436      Wt Readings from Last 3 Encounters:  05/24/14 147 lb 11.2 oz (66.996 kg)  04/27/14 148 lb 8 oz (67.359 kg)  04/18/14 148 lb (67.132 kg)      Other studies Reviewed: Additional studies/ records that were reviewed today include: Pacemaker interrogation. Review of the above records demonstrates: Normal device function, excellent percentage of biventricular pacing,  increasing thoracic impedance over the last 2 months.   ASSESSMENT AND PLAN:  I suspect that [name] has symptoms related to hypovolemia in the setting of simultaneous treatment with furosemide and glucosuria due to her new diabetes medicine. She now has excellent diabetes control. She probably no longer requires a loop diuretic especially since left ventricular ejection fraction shows substantial improvement following initiation of resynchronization pacing.  She has no evidence of active congestive heart failure or acute coronary insufficiency. No recent episodes of atrial fibrillation. No bleeding or embolic complications on Xarelto for atrial fibrillation.  Back and limb pain may be a statin side effect. Will try a 30-60 day "statin holiday"  and see her back in clinic.   Current medicines are reviewed at length with the patient today.  The patient does not have concerns regarding medicines.  Patient Instructions  Remote monitoring is used to monitor your pacemaker from home. This monitoring reduces the number of office visits required to check your device to one time per year. It allows [place] to keep an eye on the functioning of your device  to ensure it is working properly. You are scheduled for a device check from home on 08-25-2014. You may send your transmission at any time that day. If you have a wireless device, the transmission will be sent automatically. After your physician reviews your transmission, you will receive a postcard with your next transmission date.  Your physician has recommended you make the following change in your medication:  STOP furosemide.  STOP atorvastatin.   Please call us in 1 month and let us know how you are feeling.   Please continue to weigh yourself daily  Your physician recommends that you schedule a follow-up appointment in: 2 months with Dr. Heywood Bene, MD  05/24/2014 4:52 PM    [address], [place], [place]  [address] Phone: [phone]; Fax: [phone]

## 2014-05-25 ENCOUNTER — Encounter: Payer: Self-pay | Admitting: Family Medicine

## 2014-05-27 ENCOUNTER — Ambulatory Visit (INDEPENDENT_AMBULATORY_CARE_PROVIDER_SITE_OTHER): Payer: BLUE CROSS/BLUE SHIELD | Admitting: Family Medicine

## 2014-05-27 ENCOUNTER — Encounter: Payer: Self-pay | Admitting: Family Medicine

## 2014-05-27 VITALS — BP 110/72 | HR 80 | Temp 98.3°F | Ht 64.5 in | Wt 149.3 lb

## 2014-05-27 DIAGNOSIS — J069 Acute upper respiratory infection, unspecified: Secondary | ICD-10-CM

## 2014-05-27 NOTE — Patient Instructions (Addendum)
INSTRUCTIONS FOR UPPER RESPIRATORY INFECTION:  -plenty of rest and fluids  -nasal saline wash 2-3 times daily (use prepackaged nasal saline or bottled/distilled water if making your own)   -can use AFRIN nasal spray for drainage and nasal congestion - but do NOT use longer then 3-4 days  -can use tylenol or ibuprofen as directed for aches and sorethroat if no liver or kidney issues  -in the winter time, using a humidifier at night is helpful (please follow cleaning instructions)  -if you are taking a cough medication - use only as directed, may also try a teaspoon of honey to coat the throat and throat lozenges  -for sore throat, salt water gargles can help  -follow up if you have fevers, facial pain, tooth pain, difficulty breathing or are worsening or not getting better as expected or other concerns

## 2014-05-27 NOTE — Progress Notes (Signed)
Pre visit review using our clinic review tool, if applicable. No additional management support is needed unless otherwise documented below in the visit note. 

## 2014-05-27 NOTE — Progress Notes (Signed)
HPI:  URI: -started: yesterday -symptoms:nasal congestion, sore throat, PND, felt a few chills yesterday, L ear pressure -denies:objective fever, SOB, NVD, tooth pain, sinus pain -has tried: nothing -sick contacts/travel/risks: denies flu exposure, tick exposure or or Ebola risks -Hx of: denies hx asthma, lung disease or allergies ROS: See pertinent positives and negatives per HPI.  Past Medical History  Diagnosis Date  . Diabetes mellitus type II   . Hyperlipidemia   . Hypertension   . Allergic rhinitis   . Cataract   . CAD (coronary artery disease)     a. 2004: s/p MI in [place]. No PCI->Medical RX;  b. 07/2012 Cath: LM 30-40, LAD 70p, 70/47m, D1 80-90p, OM1 small 90p, OM2 large 80-90p, 56m, 70-80d, RCA 20-30 diff, EF 40%, glob HK.s/p CABG  . Ischemic cardiomyopathy     a. 07/2012 Echo: EF 35%, Sev inferoseptal HK, mildly dil LA, Peak PASP 71mmHg.  Marland Kitchen PAF (paroxysmal atrial fibrillation)     a. 07/2012: Amio and xarelto initiated.  Marland Kitchen LBBB (left bundle branch block)     a. intermittent - present during rapid afib 07/2012.  . Breast cancer 2002    Patient reports left breast cancer diagnosis in 2002 treated with bilateral mastectomy positive lymph nodes with left axillary dissection followed by chemotherapy of unknown type  . Neuromuscular disorder     Patient reports chronic numbness in the right foot related to previous surgery on the right leg and "nerve damage"  . Shortness of breath   . Biventricular cardiac pacemaker - St Jude, Nov 2014 03/04/2013  . SBO (small bowel obstruction)     Past Surgical History  Procedure Laterality Date  . Mastectomy  2002    bilateral  . Tubal ligation  1987  . Bladder surgery  2000  . Right leg surgery  2001    torn ligaments and tendons x4 surgeries  . Appendectomy  1974  . Cholecystectomy  1974  . Abdominal hysterectomy  2000  . Breast surgery      Bilateral mastectomy for left breast cancer unknown stage but patient reports  positive nodes was treated with chemotherapy  . Abdominal hysterectomy    . Coronary artery bypass graft N/A 09/28/2012    Procedure: CORONARY ARTERY BYPASS GRAFTING (CABG);  Surgeon: [name], MD;  Location: [facility];  Service: Open Heart Surgery;  Laterality: N/A;  CABG x four, using left internal mammary artery and left leg greater saphenous vein harvested endoscopically  . Maze N/A 09/28/2012    Procedure: MAZE;  Surgeon: [name], MD;  Location: [facility];  Service: Open Heart Surgery;  Laterality: N/A;  . Intraoperative transesophageal echocardiogram N/A 09/28/2012    Procedure: INTRAOPERATIVE TRANSESOPHAGEAL ECHOCARDIOGRAM;  Surgeon: [name], MD;  Location: [place];  Service: Open Heart Surgery;  Laterality: N/A;  . Epicardial pacing lead placement N/A 09/28/2012    Procedure: EPICARDIAL PACING LEAD PLACEMENT;  Surgeon: [name], MD;  Location: [place];  Service: Thoracic;  Laterality: N/A;  LV LEAD PLACEMENT  . Bi-ventricular pacemaker insertion (crt-p)  01/25/2013    Dr Lovena Le (STJ)  . Left heart catheterization with coronary angiogram N/A 07/20/2012    Procedure: LEFT HEART CATHETERIZATION WITH CORONARY ANGIOGRAM;  Surgeon: [name], MD;  Location[facility] C[facility] LAB;  Service: Cardiovascular;  Laterality: N/A;  . Bi-ventricular pacemaker insertion N/A 01/25/2013    Procedure: BI-VENTRICULAR PACEMAKER INSERTION (CRT-P);  Surgeon: Evans Lance, MD;  Location[facility] C[facility] LAB;  Service: Cardiovascular;  Laterality: N/A;    Family History  Problem Relation Age of Onset  . Kidney failure Mother   . Heart disease Mother     Died mid 34L complications of diabetes  . Arthritis Other   . Cancer Other     colon, ovarian  . Diabetes Other   . Hyperlipidemia Other   . Kidney failure Other   . Colon cancer Sister     History   Social History  . Marital Status: Married    Spouse Name: N/A  . Number of Children: 3  . Years of Education: N/A   Occupational History   . Office work    Social History Main Topics  . Smoking status: Former Smoker -- 1.00 packs/day for 10 years    Types: Cigarettes    Quit date: 03/11/2012  . Smokeless tobacco: Never Used     Comment: Quit in January  . Alcohol Use: No  . Drug Use: No  . Sexual Activity:    Partners: Female   Other Topics Concern  . None   Social History Narrative   Lives with husband and mother in [place]     Regular exercise: walking   Caffeine use: 2 cups of coffee in the morning     Current outpatient prescriptions:  .  aspirin 81 MG tablet, Take 1 tablet (81 mg total) by mouth daily., Disp: 30 tablet, Rfl:  .  Canagliflozin (INVOKANA) 100 MG TABS, Take 1 tablet by mouth daily in am, Disp: 30 tablet, Rfl: 11 .  carvedilol (COREG) 25 MG tablet, TAKE 1 TABLET (25 MG TOTAL) BY MOUTH 2 (TWO) TIMES DAILY., Disp: 60 tablet, Rfl: 9 .  Exenatide ER (BYDUREON) 2 MG PEN, INJECT 2 MG ONCE A WEEK UNDER SKIN, Disp: 12 each, Rfl: 1 .  glipiZIDE (GLUCOTROL) 5 MG tablet, TAKE 5 MG IN THE MORNING AND 5 MG IN THE EVENING, Disp: 90 tablet, Rfl: 1 .  glucose blood (ONETOUCH VERIO) test strip, Check 2 times daily. Use as instructed, Disp: 200 each, Rfl: 3 .  losartan (COZAAR) 25 MG tablet, Take 1 tablet (25 mg total) by mouth 2 (two) times daily., Disp: 180 tablet, Rfl: 3 .  metFORMIN (GLUCOPHAGE) 1000 MG tablet, TAKE 1 TABLET (1,000 MG TOTAL) BY MOUTH 2 (TWO) TIMES DAILY WITH A MEAL., Disp: 60 tablet, Rfl: 1 .  nitroGLYCERIN (NITROSTAT) 0.4 MG SL tablet, Place 1 tablet (0.4 mg total) under the tongue every 5 (five) minutes as needed for chest pain., Disp: 25 tablet, Rfl: 3 .  rivaroxaban (XARELTO) 20 MG TABS tablet, Take 1 tablet (20 mg total) by mouth daily with supper., Disp: 60 tablet, Rfl: 0  EXAM:  Filed Vitals:   05/27/14 1022  BP: 110/72  Pulse: 80  Temp: 98.3 F (36.8 C)    Body mass index is 25.24 kg/(m^2).  GENERAL: vitals reviewed and listed above, alert, oriented, appears well hydrated and in  no acute distress  HEENT: atraumatic, conjunttiva clear, no obvious abnormalities on inspection of external nose and ears, normal appearance of ear canals and TMs, clear nasal congestion, mild post oropharyngeal erythema with PND, no tonsillar edema or exudate, no sinus TTP  NECK: no obvious masses on inspection  LUNGS: clear to auscultation bilaterally, no wheezes, rales or rhonchi, good air movement  CV: HRRR, no peripheral edema  MS: moves all extremities without noticeable abnormality  PSYCH: pleasant and cooperative, no obvious depression or anxiety  ASSESSMENT AND PLAN:  Discussed the following assessment and plan:  Acute upper respiratory infection  -given HPI and exam findings today, a serious infection or illness is unlikely. We discussed potential etiologies, with VURI being most likely, and advised supportive care and monitoring. We discussed treatment side effects, likely course, antibiotic misuse, transmission, and signs of developing a serious illness. -of course, we advised to return or notify a doctor immediately if symptoms worsen or persist or new concerns arise.    Patient Instructions  INSTRUCTIONS FOR UPPER RESPIRATORY INFECTION:  -plenty of rest and fluids  -nasal saline wash 2-3 times daily (use prepackaged nasal saline or bottled/distilled water if making your own)   -can use AFRIN nasal spray for drainage and nasal congestion - but do NOT use longer then 3-4 days  -can use tylenol or ibuprofen as directed for aches and sorethroat if no liver or kidney issues  -in the winter time, using a humidifier at night is helpful (please follow cleaning instructions)  -if you are taking a cough medication - use only as directed, may also try a teaspoon of honey to coat the throat and throat lozenges  -for sore throat, salt water gargles can help  -follow up if you have fevers, facial pain, tooth pain, difficulty breathing or are worsening or not getting better  as expected or other concerns      Colin Benton R.

## 2014-05-29 ENCOUNTER — Encounter: Payer: Self-pay | Admitting: Cardiovascular Disease

## 2014-05-29 ENCOUNTER — Encounter: Payer: Self-pay | Admitting: Family Medicine

## 2014-05-29 ENCOUNTER — Encounter: Payer: Self-pay | Admitting: Internal Medicine

## 2014-05-30 ENCOUNTER — Telehealth: Payer: Self-pay | Admitting: *Deleted

## 2014-05-30 NOTE — Telephone Encounter (Signed)
Updated demographic pt phone [phone]

## 2014-06-09 ENCOUNTER — Encounter: Payer: Self-pay | Admitting: Internal Medicine

## 2014-06-16 ENCOUNTER — Encounter: Payer: Self-pay | Admitting: Cardiovascular Disease

## 2014-06-17 ENCOUNTER — Telehealth: Payer: Self-pay | Admitting: *Deleted

## 2014-06-17 DIAGNOSIS — R0602 Shortness of breath: Secondary | ICD-10-CM

## 2014-06-17 DIAGNOSIS — Z79899 Other long term (current) drug therapy: Secondary | ICD-10-CM

## 2014-06-17 DIAGNOSIS — E785 Hyperlipidemia, unspecified: Secondary | ICD-10-CM

## 2014-06-17 MED ORDER — ROSUVASTATIN CALCIUM 10 MG PO TABS: 10.0000 mg | ORAL_TABLET | Freq: Every day | ORAL | Status: DC

## 2014-06-17 NOTE — Telephone Encounter (Signed)
Patient notified to start Crestor 10mg  daily and have labs rechecked in 3 months.  Coupon card and lab order placed at the front desk for pick up. Order placed for referral to Pulmonary.  Patient voiced understanding.

## 2014-06-17 NOTE — Telephone Encounter (Signed)
-----   Message from [name], MD sent at 06/16/2014  7:15 PM EDT ----- Please start Crestor 10 mg daily, give her a coupon card, check lipids in 3 months.

## 2014-06-24 ENCOUNTER — Other Ambulatory Visit: Payer: Self-pay | Admitting: Internal Medicine

## 2014-06-29 ENCOUNTER — Institutional Professional Consult (permissible substitution): Payer: BLUE CROSS/BLUE SHIELD | Admitting: Pulmonary Disease

## 2014-07-13 ENCOUNTER — Institutional Professional Consult (permissible substitution): Payer: BLUE CROSS/BLUE SHIELD | Admitting: Pulmonary Disease

## 2014-07-18 ENCOUNTER — Ambulatory Visit: Payer: BLUE CROSS/BLUE SHIELD | Admitting: Internal Medicine

## 2014-08-03 ENCOUNTER — Institutional Professional Consult (permissible substitution): Payer: BLUE CROSS/BLUE SHIELD | Admitting: Emergency Medicine

## 2014-08-05 ENCOUNTER — Encounter: Payer: Self-pay | Admitting: Emergency Medicine

## 2014-08-05 ENCOUNTER — Ambulatory Visit (INDEPENDENT_AMBULATORY_CARE_PROVIDER_SITE_OTHER): Payer: BLUE CROSS/BLUE SHIELD | Admitting: Emergency Medicine

## 2014-08-05 VITALS — BP 140/90 | HR 85 | Ht 64.5 in | Wt 153.0 lb

## 2014-08-05 DIAGNOSIS — R0602 Shortness of breath: Secondary | ICD-10-CM | POA: Diagnosis not present

## 2014-08-05 DIAGNOSIS — R0600 Dyspnea, unspecified: Secondary | ICD-10-CM

## 2014-08-05 DIAGNOSIS — R0609 Other forms of dyspnea: Secondary | ICD-10-CM | POA: Insufficient documentation

## 2014-08-05 DIAGNOSIS — J309 Allergic rhinitis, unspecified: Secondary | ICD-10-CM

## 2014-08-05 HISTORY — DX: Dyspnea, unspecified: R06.00

## 2014-08-05 HISTORY — DX: Other forms of dyspnea: R06.09

## 2014-08-05 NOTE — Progress Notes (Signed)
Subjective:    Patient ID: [name], female    DOB: 01-15-1951, 64 y.o.   MRN: [id number]  HPI Pleasant 64 year old woman, former smoker (10 pack years), followed by Dr Sallyanne Kuster for hypertension, atrial fibrillation, coronary artery disease with an ischemic cardiomyopathy status post CABG and then biventricular pacer in 2014. Also with a history of diabetes, allergic rhinitis, breast cancer. She reports that she has been experiencing dyspnea since about mid 12/'15. Noticed more dyspnea with shopping, has had to stop to rest on a few occasions. Has been about the same over the last 6 months. She is still able to walk about 1.5 miles, but definitely not as well as previously. She  Has no associated CP or pressure. No wheezing. No cough. She does sometimes sneeze. She is relieved by resting. Her wt has fluctuated up and down about 5 lbs.                   Her diuretics were adjusted in march due to concern for hypovolemia. Her dry wt has gone up slightly, about 5 lbs. Dr C also changed her statin to crestor for aching and possibly dyspnea. They are pleased that her pacer is working correctly and efficiently.   I personally reviewed the patient's chest x-ray in February 2016, CT scan of the chest from January 2015. There are no appreciable parenchymal abnormalities. I reviewed the cardiology notes, echocardiogram results from 05/09/14 which show an LVEF of 50% and evidence for grade 1 diastolic dysfunction. There was no evidence for right ventricular dysfunction or pulmonary hypertension.    Review of Systems  Constitutional: Negative for fever and unexpected weight change.  HENT: Negative for congestion, dental problem, ear pain, nosebleeds, postnasal drip, rhinorrhea, sinus pressure, sneezing, sore throat and trouble swallowing.   Eyes: Negative for redness and itching.  Respiratory: Positive for shortness of breath. Negative for cough, chest tightness and wheezing.   Cardiovascular: Negative  for palpitations and leg swelling.  Gastrointestinal: Negative for nausea and vomiting.  Genitourinary: Negative for dysuria.  Musculoskeletal: Negative for joint swelling.  Skin: Negative for rash.  Neurological: Negative for headaches.  Hematological: Does not bruise/bleed easily.  Psychiatric/Behavioral: Negative for dysphoric mood. The patient is not nervous/anxious.     Past Medical History  Diagnosis Date  . Diabetes mellitus type II   . Hyperlipidemia   . Hypertension   . Allergic rhinitis   . Cataract   . CAD (coronary artery disease)     a. 2004: s/p MI in [place]. No PCI->Medical RX;  b. 07/2012 Cath: LM 30-40, LAD 70p, 70/42m, D1 80-90p, OM1 small 90p, OM2 large 80-90p, 62m, 70-80d, RCA 20-30 diff, EF 40%, glob HK.s/p CABG  . Ischemic cardiomyopathy     a. 07/2012 Echo: EF 35%, Sev inferoseptal HK, mildly dil LA, Peak PASP 64mmHg.  Marland Kitchen PAF (paroxysmal atrial fibrillation)     a. 07/2012: Amio and xarelto initiated.  Marland Kitchen LBBB (left bundle branch block)     a. intermittent - present during rapid afib 07/2012.  . Breast cancer 2002    Patient reports left breast cancer diagnosis in 2002 treated with bilateral mastectomy positive lymph nodes with left axillary dissection followed by chemotherapy of unknown type  . Neuromuscular disorder     Patient reports chronic numbness in the right foot related to previous surgery on the right leg and "nerve damage"  . Shortness of breath   . Biventricular cardiac pacemaker - St Jude, Nov 2014 03/04/2013  .  SBO (small bowel obstruction)      Family History  Problem Relation Age of Onset  . Kidney failure Mother   . Heart disease Mother     Died mid 99B complications of diabetes  . Arthritis Other   . Cancer Other     colon, ovarian  . Diabetes Other   . Hyperlipidemia Other   . Kidney failure Other   . Colon cancer Sister      History   Social History  . Marital Status: Married    Spouse Name: N/A  . Number of Children: 3  .  Years of Education: N/A   Occupational History  . Office work    Social History Main Topics  . Smoking status: Former Smoker -- 1.00 packs/day for 10 years    Types: Cigarettes    Quit date: 03/11/2012  . Smokeless tobacco: Never Used     Comment: Quit in January  . Alcohol Use: No  . Drug Use: No  . Sexual Activity:    Partners: Female   Other Topics Concern  . Not on file   Social History Narrative   Lives with husband and mother in law.     Regular exercise: walking   Caffeine use: 2 cups of coffee in the morning  no mold exposure, owns dogs, never birds Works an Marketing executive job.  Has lived in [place] and  [place]  No Known Allergies   Outpatient Prescriptions Prior to Visit  Medication Sig Dispense Refill  . aspirin 81 MG tablet Take 1 tablet (81 mg total) by mouth daily. 30 tablet   . Canagliflozin (INVOKANA) 100 MG TABS Take 1 tablet by mouth daily in am 30 tablet 11  . carvedilol (COREG) 25 MG tablet TAKE 1 TABLET (25 MG TOTAL) BY MOUTH 2 (TWO) TIMES DAILY. 60 tablet 9  . Exenatide ER (BYDUREON) 2 MG PEN INJECT 2 MG ONCE A WEEK UNDER SKIN 12 each 1  . glipiZIDE (GLUCOTROL) 5 MG tablet TAKE 5 MG IN THE MORNING AND 5 MG IN THE EVENING 90 tablet 1  . glipiZIDE (GLUCOTROL) 5 MG tablet Take 1 tablet (5 mg total) by mouth 2 (two) times daily. 60 tablet 1  . glucose blood (ONETOUCH VERIO) test strip Check 2 times daily. Use as instructed 200 each 3  . losartan (COZAAR) 25 MG tablet Take 1 tablet (25 mg total) by mouth 2 (two) times daily. 180 tablet 3  . metFORMIN (GLUCOPHAGE) 1000 MG tablet TAKE 1 TABLET (1,000 MG TOTAL) BY MOUTH 2 (TWO) TIMES DAILY WITH A MEAL. 60 tablet 1  . nitroGLYCERIN (NITROSTAT) 0.4 MG SL tablet Place 1 tablet (0.4 mg total) under the tongue every 5 (five) minutes as needed for chest pain. 25 tablet 3  . rivaroxaban (XARELTO) 20 MG TABS tablet Take 1 tablet (20 mg total) by mouth daily with supper. 60 tablet 0  . rosuvastatin (CRESTOR) 10 MG tablet  Take 1 tablet (10 mg total) by mouth daily. 90 tablet 3   No facility-administered medications prior to visit.         Objective:   Physical Exam Filed Vitals:   08/05/14 0923 08/05/14 0924  BP:  140/90  Pulse:  85  Height: 5' 4.5" (1.638 m)   Weight: 153 lb (69.4 kg)   SpO2:  98%   Gen: Pleasant, well-nourished, in no distress,  normal affect  ENT: No lesions,  mouth clear,  oropharynx clear, no postnasal drip  Neck: No JVD, no TMG,  no carotid bruits  Lungs: No use of accessory muscles, distant BS, clear without rales or rhonchi  Cardiovascular: RRR, heart sounds normal, no murmur or gallops, no peripheral edema  Musculoskeletal: No deformities, no cyanosis or clubbing  Neuro: alert, non focal  Skin: Warm, no lesions or rashes   TTE 05/09/14 --  Study Conclusions - Left ventricle: The cavity size was normal. Wall thickness was normal. Systolic function was normal. The estimated ejection fraction was in the range of 50% to 55%. Doppler parameters are consistent with abnormal left ventricular relaxation (grade 1 diastolic dysfunction). Normal RV size and fxn. Normal PAP's    CXR 05/06/14 --  COMPARISON: 10/23/2013 FINDINGS: The heart size and mediastinal contours are normal. Vascular pattern is normal. Lungs are clear. Cardiac pacer identified with leads in unchanged position in generator over left thorax as previously noted. IMPRESSION: No active cardiopulmonary disease     Assessment & Plan:  Allergic rhinitis Appears to be stable at this time. I do not believe this is a large contributor to her current symptoms. We will monitor this to ensure no change   Exertional dyspnea Very interesting case. This pleasant woman has significant heart disease that includes hypertension, age fibrillation, ischemic cardiomyopathy but she appears to be very well compensated on her current regimen. Her dyspnea is long-standing and stable without any real change  despite some alterations in her medical regimen including change in her diuretics and a change from Lipitor to Crestor. She does not appear to be volume overloaded clinically. Certainly she could have some degree of obstructive lung disease although she has only a minimal tobacco history of 10 pack years. I like to start her evaluation with primary function testing and a repeat chest x-ray on her return. I will then review the information with Dr Sallyanne Kuster to plan our next steps. She may benefit from a cardiopulmonary exercise test at some point in the future.

## 2014-08-05 NOTE — Assessment & Plan Note (Signed)
Very interesting case. This pleasant woman has significant heart disease that includes hypertension, age fibrillation, ischemic cardiomyopathy but she appears to be very well compensated on her current regimen. Her dyspnea is long-standing and stable without any real change despite some alterations in her medical regimen including change in her diuretics and a change from Lipitor to Crestor. She does not appear to be volume overloaded clinically. Certainly she could have some degree of obstructive lung disease although she has only a minimal tobacco history of 10 pack years. I like to start her evaluation with primary function testing and a repeat chest x-ray on her return. I will then review the information with Dr Sallyanne Kuster to plan our next steps. She may benefit from a cardiopulmonary exercise test at some point in the future.

## 2014-08-05 NOTE — Assessment & Plan Note (Signed)
Appears to be stable at this time. I do not believe this is a large contributor to her current symptoms. We will monitor this to ensure no change

## 2014-08-05 NOTE — Patient Instructions (Signed)
Please continue your medications as prescribed We will perform full pulmonary function testing to compare with your prior test in 2014 Follow with Dr Lamonte Sakai next available with full PFT

## 2014-08-09 ENCOUNTER — Encounter: Payer: Self-pay | Admitting: Family Medicine

## 2014-08-10 ENCOUNTER — Telehealth: Payer: Self-pay | Admitting: Cardiovascular Disease

## 2014-08-10 NOTE — Telephone Encounter (Signed)
spoke with pt and told her we do not have any samples of xarelto

## 2014-08-10 NOTE — Telephone Encounter (Signed)
Pt called in wanting some samples of Xarelto. Please call  Thanks

## 2014-08-12 ENCOUNTER — Encounter: Payer: Self-pay | Admitting: *Deleted

## 2014-08-12 ENCOUNTER — Other Ambulatory Visit: Payer: Self-pay | Admitting: Family Medicine

## 2014-08-12 MED ORDER — RIVAROXABAN 20 MG PO TABS: 20.0000 mg | ORAL_TABLET | Freq: Every day | ORAL | Status: DC

## 2014-08-16 ENCOUNTER — Encounter: Payer: BLUE CROSS/BLUE SHIELD | Admitting: Cardiovascular Disease

## 2014-08-20 ENCOUNTER — Other Ambulatory Visit: Payer: Self-pay | Admitting: Internal Medicine

## 2014-08-30 ENCOUNTER — Ambulatory Visit (INDEPENDENT_AMBULATORY_CARE_PROVIDER_SITE_OTHER): Payer: BLUE CROSS/BLUE SHIELD | Admitting: Internal Medicine

## 2014-08-30 ENCOUNTER — Encounter: Payer: Self-pay | Admitting: Internal Medicine

## 2014-08-30 VITALS — BP 100/60 | HR 94 | Temp 98.8°F | Resp 18 | Ht 64.5 in | Wt 152.0 lb

## 2014-08-30 DIAGNOSIS — R0609 Other forms of dyspnea: Secondary | ICD-10-CM | POA: Diagnosis not present

## 2014-08-30 DIAGNOSIS — L259 Unspecified contact dermatitis, unspecified cause: Secondary | ICD-10-CM

## 2014-08-30 MED ORDER — METHYLPREDNISOLONE ACETATE 80 MG/ML IJ SUSP
40.0000 mg | Freq: Once | INTRAMUSCULAR | Status: AC
  Administered 2014-08-30: 40 mg via INTRAMUSCULAR

## 2014-08-30 MED ORDER — TRIAMCINOLONE ACETONIDE 0.1 % EX CREA: 1.0000 "application " | TOPICAL_CREAM | Freq: Two times a day (BID) | CUTANEOUS | Status: DC

## 2014-08-30 NOTE — Progress Notes (Signed)
Subjective:    Patient ID: [name], female    DOB: [birth year], 64 y.o.   MRN: [id number]  HPI  64 year old patient who has a history of essential hypertension and type 2 diabetes.  She presents with a two-week history of a rash involving the inner aspects of both arms.  This started on June 7 when she was in [place].  These lesions apparently started as blisters and remain quite pruritic.  Past Medical History  Diagnosis Date  . Diabetes mellitus type II   . Hyperlipidemia   . Hypertension   . Allergic rhinitis   . Cataract   . CAD (coronary artery disease)     a. 2004: s/p MI in [place]. No PCI->Medical RX;  b. 07/2012 Cath: LM 30-40, LAD 70p, 70/62m, D1 80-90p, OM1 small 90p, OM2 large 80-90p, 35m, 70-80d, RCA 20-30 diff, EF 40%, glob HK.s/p CABG  . Ischemic cardiomyopathy     a. 07/2012 Echo: EF 35%, Sev inferoseptal HK, mildly dil LA, Peak PASP 25mmHg.  Marland Kitchen PAF (paroxysmal atrial fibrillation)     a. 07/2012: Amio and xarelto initiated.  Marland Kitchen LBBB (left bundle branch block)     a. intermittent - present during rapid afib 07/2012.  . Breast cancer 2002    Patient reports left breast cancer diagnosis in 2002 treated with bilateral mastectomy positive lymph nodes with left axillary dissection followed by chemotherapy of unknown type  . Neuromuscular disorder     Patient reports chronic numbness in the right foot related to previous surgery on the right leg and "nerve damage"  . Shortness of breath   . Biventricular cardiac pacemaker - St Jude, Nov 2014 03/04/2013  . SBO (small bowel obstruction)     History   Social History  . Marital Status: Married    Spouse Name: N/A  . Number of Children: 3  . Years of Education: N/A   Occupational History  . Office work    Social History Main Topics  . Smoking status: Former Smoker -- 1.00 packs/day for 10 years    Types: Cigarettes    Quit date: 03/11/2012  . Smokeless tobacco: Never Used     Comment: Quit in January  . Alcohol  Use: No  . Drug Use: No  . Sexual Activity:    Partners: Female   Other Topics Concern  . Not on file   Social History Narrative   Lives with husband and mother in law.     Regular exercise: walking   Caffeine use: 2 cups of coffee in the morning    Past Surgical History  Procedure Laterality Date  . Mastectomy  2002    bilateral  . Tubal ligation  1987  . Bladder surgery  2000  . Right leg surgery  2001    torn ligaments and tendons x4 surgeries  . Appendectomy  1974  . Cholecystectomy  1974  . Abdominal hysterectomy  2000  . Breast surgery      Bilateral mastectomy for left breast cancer unknown stage but patient reports positive nodes was treated with chemotherapy  . Abdominal hysterectomy    . Coronary artery bypass graft N/A 09/28/2012    Procedure: CORONARY ARTERY BYPASS GRAFTING (CABG);  Surgeon: [name], MD;  Location: [facility];  Service: Open Heart Surgery;  Laterality: N/A;  CABG x four, using left internal mammary artery and left leg greater saphenous vein harvested endoscopically  . Maze N/A 09/28/2012    Procedure: MAZE;  Surgeon: Lilia Argue  Servando Snare, MD;  Location: [facility];  Service: Open Heart Surgery;  Laterality: N/A;  . Intraoperative transesophageal echocardiogram N/A 09/28/2012    Procedure: INTRAOPERATIVE TRANSESOPHAGEAL ECHOCARDIOGRAM;  Surgeon: [name], MD;  Location: [facility];  Service: Open Heart Surgery;  Laterality: N/A;  . Epicardial pacing lead placement N/A 09/28/2012    Procedure: EPICARDIAL PACING LEAD PLACEMENT;  Surgeon: [name], MD;  Location: [facility];  Service: Thoracic;  Laterality: N/A;  LV LEAD PLACEMENT  . Bi-ventricular pacemaker insertion (crt-p)  01/25/2013    Dr Lovena Le ([facility])  . Left heart catheterization with coronary angiogram N/A 07/20/2012    Procedure: LEFT HEART CATHETERIZATION WITH CORONARY ANGIOGRAM;  Surgeon: [name], MD;  Location: [facility] CATH LAB;  Service: Cardiovascular;  Laterality: N/A;  . Bi-ventricular  pacemaker insertion N/A 01/25/2013    Procedure: BI-VENTRICULAR PACEMAKER INSERTION (CRT-P);  Surgeon: Evans Lance, MD;  Location: [facility] CATH LAB;  Service: Cardiovascular;  Laterality: N/A;    Family History  Problem Relation Age of Onset  . Kidney failure Mother   . Heart disease Mother     Died mid 03K complications of diabetes  . Arthritis Other   . Cancer Other     colon, ovarian  . Diabetes Other   . Hyperlipidemia Other   . Kidney failure Other   . Colon cancer Sister     No Known Allergies  Current Outpatient Prescriptions on File Prior to Visit  Medication Sig Dispense Refill  . aspirin 81 MG tablet Take 1 tablet (81 mg total) by mouth daily. 30 tablet   . Canagliflozin (INVOKANA) 100 MG TABS Take 1 tablet by mouth daily in am 30 tablet 11  . carvedilol (COREG) 25 MG tablet TAKE 1 TABLET (25 MG TOTAL) BY MOUTH 2 (TWO) TIMES DAILY. 60 tablet 9  . Exenatide ER (BYDUREON) 2 MG PEN INJECT 2 MG ONCE A WEEK UNDER SKIN 12 each 1  . glipiZIDE (GLUCOTROL) 5 MG tablet TAKE 5 MG IN THE MORNING AND 5 MG IN THE EVENING 90 tablet 1  . glipiZIDE (GLUCOTROL) 5 MG tablet TAKE 1 TABLET (5 MG TOTAL) BY MOUTH 2 (TWO) TIMES DAILY. 60 tablet 1  . glucose blood (ONETOUCH VERIO) test strip Check 2 times daily. Use as instructed 200 each 3  . losartan (COZAAR) 25 MG tablet Take 1 tablet (25 mg total) by mouth 2 (two) times daily. 180 tablet 3  . metFORMIN (GLUCOPHAGE) 1000 MG tablet TAKE 1 TABLET (1,000 MG TOTAL) BY MOUTH 2 (TWO) TIMES DAILY WITH A MEAL. 60 tablet 1  . nitroGLYCERIN (NITROSTAT) 0.4 MG SL tablet Place 1 tablet (0.4 mg total) under the tongue every 5 (five) minutes as needed for chest pain. 25 tablet 3  . rivaroxaban (XARELTO) 20 MG TABS tablet Take 1 tablet (20 mg total) by mouth daily with supper. 45 tablet 0  . rosuvastatin (CRESTOR) 10 MG tablet Take 1 tablet (10 mg total) by mouth daily. 90 tablet 3   No current facility-administered medications on file prior to visit.    BP  100/60 mmHg  Pulse 94  Temp(Src) 98.8 F (37.1 C) (Oral)  Resp 18  Ht 5' 4.5" (1.638 m)  Wt 152 lb (68.947 kg)  BMI 25.70 kg/m2  SpO2 98%     Review of Systems  Constitutional: Negative.   HENT: Negative for congestion, dental problem, hearing loss, rhinorrhea, sinus pressure, sore throat and tinnitus.   Eyes: Negative for pain, discharge and visual disturbance.  Respiratory:  Negative for cough and shortness of breath.   Cardiovascular: Negative for chest pain, palpitations and leg swelling.  Gastrointestinal: Negative for nausea, vomiting, abdominal pain, diarrhea, constipation, blood in stool and abdominal distention.  Genitourinary: Negative for dysuria, urgency, frequency, hematuria, flank pain, vaginal bleeding, vaginal discharge, difficulty urinating, vaginal pain and pelvic pain.  Musculoskeletal: Negative for joint swelling, arthralgias and gait problem.  Skin: Positive for rash.  Neurological: Negative for dizziness, syncope, speech difficulty, weakness, numbness and headaches.  Hematological: Negative for adenopathy.  Psychiatric/Behavioral: Negative for behavioral problems, dysphoric mood and agitation. The patient is not nervous/anxious.        Objective:   Physical Exam  Constitutional: She appears well-developed and well-nourished. No distress.  Skin:  Dry scaly dermatitis over the forearms bilaterally.  She has scattered areas of excoriations and dry, crusted lesions          Assessment & Plan:   Resolving contact dermatitis.  Will treat with modest dose of Depo-Medrol in view of her diabetes.  Will treat with topical triamcinolone Hypertension Type 2 diabetes

## 2014-08-30 NOTE — Patient Instructions (Signed)

## 2014-08-30 NOTE — Progress Notes (Signed)
Pre visit review using our clinic review tool, if applicable. No additional management support is needed unless otherwise documented below in the visit note. 

## 2014-09-22 ENCOUNTER — Other Ambulatory Visit (INDEPENDENT_AMBULATORY_CARE_PROVIDER_SITE_OTHER): Payer: BLUE CROSS/BLUE SHIELD | Admitting: *Deleted

## 2014-09-22 ENCOUNTER — Ambulatory Visit (INDEPENDENT_AMBULATORY_CARE_PROVIDER_SITE_OTHER): Payer: BLUE CROSS/BLUE SHIELD | Admitting: Internal Medicine

## 2014-09-22 ENCOUNTER — Other Ambulatory Visit: Payer: Self-pay | Admitting: *Deleted

## 2014-09-22 ENCOUNTER — Encounter: Payer: Self-pay | Admitting: Internal Medicine

## 2014-09-22 VITALS — BP 102/64 | HR 92 | Temp 98.3°F | Resp 12 | Wt 152.6 lb

## 2014-09-22 DIAGNOSIS — E1165 Type 2 diabetes mellitus with hyperglycemia: Secondary | ICD-10-CM | POA: Diagnosis not present

## 2014-09-22 DIAGNOSIS — IMO0002 Reserved for concepts with insufficient information to code with codable children: Secondary | ICD-10-CM

## 2014-09-22 LAB — POCT GLYCOSYLATED HEMOGLOBIN (HGB A1C): Hemoglobin A1C: 6.7

## 2014-09-22 MED ORDER — GLIPIZIDE 5 MG PO TABS: 2.5000 mg | ORAL_TABLET | Freq: Two times a day (BID) | ORAL | Status: DC

## 2014-09-22 NOTE — Patient Instructions (Signed)
Please continue: - Metformin 1000 mg 2x a day - Invokana 100 mg in am  - Bydureon 2 mg on Sundays  Please decrease: - Glipizide to 2.5 mg in am and 2.5 before dinner  Please come back for a follow-up appointment in 3 months

## 2014-09-22 NOTE — Progress Notes (Signed)
Patient ID: [name], female   DOB: 12-01-1950, 64 y.o.   MRN: [id number]  HPI: [name] is a 64 y.o.-year-old female, returning for f/u for DM2, dx 2002, insulin-independent, uncontrolled, without complications. Last visit 5 mo ago.  She has SOB for several months >> Dr Sallyanne Kuster stopped Lasix >> still SOB >> saw pulmonologist >> goes back to have pulmonary tests on 09/30/2014.   Last hemoglobin A1c was: Lab Results  Component Value Date   HGBA1C 6.8* 04/07/2014   HGBA1C 7.8* 01/14/2014   HGBA1C 9.4* 09/23/2013  Patient has been on insulin before >> hypoglycemia.   Pt is on a regimen of: - Metformin 1000 mg po bid - Glipizide 10 mg bid >> 10 mg in am and 5 mg in pm >> 5 mg bid - Invokana 100 mg (started 10/2013) - no yeast inf or UTIs. - Bydureon 2 mg weekly - started 01/2014 Could not afford Januvia 100 mg daily >> 100$ reduced from 348%. She was on Amaryl 2 mg bid.  Pt checks her sugars 1x a day and they are: - am: 56-140 >> 107-186, most 120-140 >> 170-200 >> 170-233 >> 135-140 >> 120-150 - before lunch: 80-90 >> 70-191 >> 74 few times, 100-160 >> 130-220 >> 80-120 >> 80-120 - after lunch: 130-140 - before dinner: 56-60s but sometimes 102 >> 49-156 >> 100-150 >> 98-161 >> 115-130 >> 50, 70-120, 140 - bedtime: 80-110 >> 60-112 >> n/c >> 63, 81 >> 100 >> 100-120, 170 if has fruit Still has lows. Lowest sugar was 74 >> 63 after dinner x1 >> 50s; she has hypoglycemia awareness at 60. Highest sugar was 233 >> <200.  - no CKD, last BUN/creatinine:  Lab Results  Component Value Date   BUN 16 04/27/2014   CREATININE 0.62 04/27/2014  she is on losartan. - last set of lipids: Lab Results  Component Value Date   CHOL 110 04/07/2014   HDL 55.70 04/07/2014   LDLCALC 38 04/07/2014   LDLDIRECT 134.3 09/19/2009   TRIG 82.0 04/07/2014   CHOLHDL 2 04/07/2014  she is on Crestor << prev. atorvastatin. - last eye exam was 03/2014. Has cataracts. No DR.  - no numbness and  tingling in her feet. She is on aspirin 81.  ROS: Constitutional: no weight gain/loss, + fatigue, no subjective hyperthermia/hypothermia, + nocturia Eyes: no blurry vision, no xerophthalmia ENT: no sore throat, no nodules palpated in throat, no dysphagia/odynophagia, no hoarseness Cardiovascular: no CP/+ SOB/no palpitations/+ leg swelling Respiratory: no cough/+ SOB Gastrointestinal: no N/V/D/C Musculoskeletal: no muscle/joint aches Skin: no rashes Neurological: no tremors/numbness/tingling/dizziness  I reviewed pt's medications, allergies, PMH, social hx, family hx, and changes were documented in the history of present illness. Otherwise, unchanged from my initial visit note.  PE: BP 102/64 mmHg  Pulse 92  Temp(Src) 98.3 F (36.8 C) (Oral)  Resp 12  Wt 152 lb 9.6 oz (69.219 kg)  SpO2 96% Body mass index is 25.8 kg/(m^2).  Wt Readings from Last 3 Encounters:  09/22/14 152 lb 9.6 oz (69.219 kg)  08/30/14 152 lb (68.947 kg)  08/05/14 153 lb (69.4 kg)   Constitutional: normal weight, in NAD Eyes: PERRLA, EOMI, no exophthalmos ENT: moist mucous membranes, no thyromegaly, no cervical lymphadenopathy Cardiovascular: RRR, 1/6 SEM, no RG, no leg edema Respiratory: CTA B Gastrointestinal: abdomen soft, NT, ND, BS+ Musculoskeletal: no deformities, strength intact in all 4 Skin: moist, warm, no rashes Neurological: no tremor with outstretched hands, DTR normal in all 4  ASSESSMENT: 1. 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 Patient with long-standing, recently more controlled diabetes, on oral antidiabetic regimen. Last HbA1c is great at 6.8%!b She still has some low CBGs >> we will decrease Glipizide further to 2.5 mg bid. - I advised her to decrease Glipizide as she may get low before lunch:  Patient Instructions  Please continue: -  Metformin 1000 mg 2x a day - Invokana 100 mg in am  - Bydureon 2 mg on Sundays  Please decrease: - Glipizide to 2.5 mg in am and 2.5 before dinner  Please come back for a follow-up appointment in 3 months  - continue checking her sugars at different times of the day - check 2 times a day, rotating checks >> bring log at next visit - up to date with eye exams - given more sugar logs - check POC A1c today >> 6.7% (a little lower) - Return to clinic in 3 mo with sugar log

## 2014-09-26 ENCOUNTER — Other Ambulatory Visit: Payer: Self-pay

## 2014-09-26 MED ORDER — LOSARTAN POTASSIUM 25 MG PO TABS: 25.0000 mg | ORAL_TABLET | Freq: Two times a day (BID) | ORAL | Status: DC

## 2014-09-27 ENCOUNTER — Telehealth: Payer: Self-pay | Admitting: *Deleted

## 2014-09-27 MED ORDER — EXENATIDE ER 2 MG ~~LOC~~ PEN: PEN_INJECTOR | SUBCUTANEOUS | Status: DC

## 2014-09-27 NOTE — Telephone Encounter (Signed)
Patient called, she needs a refill of her bydureon and said it needs to say for 28 days otherwise they will charge her 2 copays.  Patient would like for you to call her also.

## 2014-09-27 NOTE — Telephone Encounter (Signed)
Called pt and advised her that her refill of Bydureon to her pharmacy. Pt voiced understanding.

## 2014-09-30 ENCOUNTER — Encounter: Payer: Self-pay | Admitting: Emergency Medicine

## 2014-09-30 ENCOUNTER — Telehealth: Payer: Self-pay | Admitting: *Deleted

## 2014-09-30 ENCOUNTER — Ambulatory Visit (INDEPENDENT_AMBULATORY_CARE_PROVIDER_SITE_OTHER): Payer: BLUE CROSS/BLUE SHIELD | Admitting: Emergency Medicine

## 2014-09-30 VITALS — BP 118/72 | HR 89 | Ht 63.0 in | Wt 153.0 lb

## 2014-09-30 DIAGNOSIS — R0602 Shortness of breath: Secondary | ICD-10-CM | POA: Diagnosis not present

## 2014-09-30 DIAGNOSIS — J309 Allergic rhinitis, unspecified: Secondary | ICD-10-CM | POA: Diagnosis not present

## 2014-09-30 DIAGNOSIS — R0609 Other forms of dyspnea: Secondary | ICD-10-CM | POA: Diagnosis not present

## 2014-09-30 LAB — PULMONARY FUNCTION TEST
DL/VA % pred: 89 %
DL/VA: 4.21 ml/min/mmHg/L
DLCO unc % pred: 84 %
DLCO unc: 19.25 ml/min/mmHg
FEF 25-75 Post: 2.67 L/sec
FEF 25-75 Pre: 2.05 L/sec
FEF2575-%Change-Post: 29 %
FEF2575-%Pred-Post: 122 %
FEF2575-%Pred-Pre: 94 %
FEV1-%Change-Post: 5 %
FEV1-%Pred-Post: 95 %
FEV1-%Pred-Pre: 90 %
FEV1-Post: 2.29 L
FEV1-Pre: 2.16 L
FEV1FVC-%Change-Post: 3 %
FEV1FVC-%Pred-Pre: 102 %
FEV6-%Change-Post: 2 %
FEV6-%Pred-Post: 92 %
FEV6-%Pred-Pre: 89 %
FEV6-Post: 2.76 L
FEV6-Pre: 2.69 L
FEV6FVC-%Change-Post: 0 %
FEV6FVC-%Pred-Post: 103 %
FEV6FVC-%Pred-Pre: 104 %
FVC-%Change-Post: 2 %
FVC-%Pred-Post: 89 %
FVC-%Pred-Pre: 86 %
FVC-Post: 2.77 L
FVC-Pre: 2.7 L
Post FEV1/FVC ratio: 83 %
Post FEV6/FVC ratio: 100 %
Pre FEV1/FVC ratio: 80 %
Pre FEV6/FVC Ratio: 100 %
RV % pred: 111 %
RV: 2.22 L
TLC % pred: 98 %
TLC: 4.82 L

## 2014-09-30 MED ORDER — LOSARTAN POTASSIUM 25 MG PO TABS: 25.0000 mg | ORAL_TABLET | Freq: Two times a day (BID) | ORAL | Status: DC

## 2014-09-30 NOTE — Assessment & Plan Note (Signed)
Trial of loratadine

## 2014-09-30 NOTE — Telephone Encounter (Signed)
[employer] [place] approved Losartan twice daily dosing 09/29/2014 through 03/10/2038.  Kristopher Oppenheim Notified of ref # [id number].

## 2014-09-30 NOTE — Patient Instructions (Signed)
Your pulmonary function testing is normal and probably does not give an explanation for why you have been short of breath and weak.  Please stop your Crestor for the next 2 weeks. At that time call our office to let us know if your breathing and stamina are improved. If not then I believe you need to have a cardiopulmonary exercise test. We will discuss setting this test when you call Start taking loratadine 10 mg daily see if this helps with your allergies and throat irritation Follow with Dr Lamonte Sakai in 2 months or sooner if you have any problems.

## 2014-09-30 NOTE — Assessment & Plan Note (Signed)
Etiology remains unclear. Her pulmonary function testing is reassuring. She describes leg fatigue and weakness which makes me wonder if some of her symptoms could be related to the Crestor. It's also possible that this is deconditioning. Does not appear to be significant upper airway component but that is possible as well. If she does not get any better when we do a trial off Crestor then I believe she needs cardiopulmonary exercise test. If stopping Crestor does help her symptoms then she'll need to talk to Dr Avon Gully about finding a substitute

## 2014-09-30 NOTE — Progress Notes (Signed)
Subjective:    Patient ID: [name], female    DOB: [birth year], 64 y.o.   MRN: [id number]  HPI Pleasant 64 year old woman, former smoker (10 pack years), followed by Dr Sallyanne Kuster for hypertension, atrial fibrillation, coronary artery disease with an ischemic cardiomyopathy status post CABG and then biventricular pacer in 2014. Also with a history of diabetes, allergic rhinitis, breast cancer. She reports that she has been experiencing dyspnea since about mid 12/'15. Noticed more dyspnea with shopping, has had to stop to rest on a few occasions. Has been about the same over the last 6 months. She is still able to walk about 1.5 miles, but definitely not as well as previously. She  Has no associated CP or pressure. No wheezing. No cough. She does sometimes sneeze. She is relieved by resting. Her wt has fluctuated up and down about 5 lbs.                   Her diuretics were adjusted in march due to concern for hypovolemia. Her dry wt has gone up slightly, about 5 lbs. Dr C also changed her statin to crestor for aching and possibly dyspnea. They are pleased that her pacer is working correctly and efficiently.   I personally reviewed the patient's chest x-ray in February 2016, CT scan of the chest from January 2015. There are no appreciable parenchymal abnormalities. I reviewed the cardiology notes, echocardiogram results from 05/09/14 which show an LVEF of 50% and evidence for grade 1 diastolic dysfunction. There was no evidence for right ventricular dysfunction or pulmonary hypertension.   ROV 09/30/14 -- follow-up visit for dyspnea in the setting of known cardiac disease outlined above. She underwent pulmonary function testing today that I reviewed myself. This shows normal airflow without a bronchodilator response. There may be a mild suggestion of obstructive lung disease based on the curve for flow volume loop. Her lung volumes were normal and her diffusion capacity was normal. She unfortunately  tells me that she is feeling worse, feels that her legs get weak, breathing keeps her from proceeding. She sometimes gets SOB talking.   Review of Systems  Constitutional: Negative for fever and unexpected weight change.  HENT: Negative for congestion, dental problem, ear pain, nosebleeds, postnasal drip, rhinorrhea, sinus pressure, sneezing, sore throat and trouble swallowing.   Eyes: Negative for redness and itching.  Respiratory: Positive for shortness of breath. Negative for cough, chest tightness and wheezing.   Cardiovascular: Negative for palpitations and leg swelling.  Gastrointestinal: Negative for nausea and vomiting.  Genitourinary: Negative for dysuria.  Musculoskeletal: Negative for joint swelling.  Skin: Negative for rash.  Neurological: Negative for headaches.  Hematological: Does not bruise/bleed easily.  Psychiatric/Behavioral: Negative for dysphoric mood. The patient is not nervous/anxious.        Objective:   Physical Exam Filed Vitals:   09/30/14 1401  BP: 118/72  Pulse: 89  Height: 5\' 3"  (1.6 m)  Weight: 153 lb (69.4 kg)  SpO2: 100%   Gen: Pleasant, well-nourished, in no distress,  normal affect  ENT: No lesions,  mouth clear,  oropharynx clear, no postnasal drip  Neck: No JVD, no TMG, no carotid bruits  Lungs: No use of accessory muscles, distant BS, clear without rales or rhonchi  Cardiovascular: RRR, heart sounds normal, no murmur or gallops, no peripheral edema  Musculoskeletal: No deformities, no cyanosis or clubbing  Neuro: alert, non focal  Skin: Warm, no lesions or rashes   TTE 05/09/14 --  Study Conclusions -  Left ventricle: The cavity size was normal. Wall thickness was normal. Systolic function was normal. The estimated ejection fraction was in the range of 50% to 55%. Doppler parameters are consistent with abnormal left ventricular relaxation (grade 1 diastolic dysfunction). Normal RV size and fxn. Normal PAP's    CXR  05/06/14 --  COMPARISON: 10/23/2013 FINDINGS: The heart size and mediastinal contours are normal. Vascular pattern is normal. Lungs are clear. Cardiac pacer identified with leads in unchanged position in generator over left thorax as previously noted. IMPRESSION: No active cardiopulmonary disease     Assessment & Plan:  Allergic rhinitis Trial of loratadine  Exertional dyspnea Etiology remains unclear. Her pulmonary function testing is reassuring. She describes leg fatigue and weakness which makes me wonder if some of her symptoms could be related to the Crestor. It's also possible that this is deconditioning. Does not appear to be significant upper airway component but that is possible as well. If she does not get any better when we do a trial off Crestor then I believe she needs cardiopulmonary exercise test. If stopping Crestor does help her symptoms then she'll need to talk to Dr Avon Gully about finding a substitute

## 2014-09-30 NOTE — Progress Notes (Signed)
PFT done today. 

## 2014-10-13 ENCOUNTER — Other Ambulatory Visit: Payer: Self-pay | Admitting: Internal Medicine

## 2014-10-13 NOTE — Telephone Encounter (Signed)
Patient called and would like a refill on her medication   Rx: Metformin   Pharmacy: [facility]    Thank you

## 2014-10-13 NOTE — Telephone Encounter (Signed)
Done

## 2014-10-17 ENCOUNTER — Telehealth: Payer: Self-pay | Admitting: Emergency Medicine

## 2014-10-17 DIAGNOSIS — R0609 Other forms of dyspnea: Secondary | ICD-10-CM

## 2014-10-17 NOTE — Telephone Encounter (Signed)
lmtcb x1 for pt. 

## 2014-10-18 NOTE — Telephone Encounter (Signed)
Please order a cardiopulmonary exercise test (if she agrees). Then arrange for OV with me a few weeks after to review the results. She can go back on the Crestor if she would like; I don;t think it was causing her problems.

## 2014-10-18 NOTE — Telephone Encounter (Signed)
lmtcb x1 

## 2014-10-18 NOTE — Telephone Encounter (Signed)
Pt states that she has been off Crestor since last OV - pt states that her breathing and stamina has not improved over the last 2 weeks.  Patient Instructions     Your pulmonary function testing is normal and probably does not give an explanation for why you have been short of breath and weak.  Please stop your Crestor for the next 2 weeks. At that time call our office to let us know if your breathing and stamina are improved. If not then I believe you need to have a cardiopulmonary exercise test. We will discuss setting this test when you call Start taking loratadine 10 mg daily see if this helps with your allergies and throat irritation Follow with Dr Lamonte Sakai in 2 months or sooner if you have any problems.   Please advise RB- thanks.

## 2014-10-18 NOTE — Telephone Encounter (Signed)
Patient returned call. May be reached at [phone].

## 2014-10-18 NOTE — Telephone Encounter (Signed)
Pt aware that RB rec's she has CPST - this has been ordered.  Pt aware that she will also need to schedule a follow up x 2-3 weeks after test is completed.  Pt also aware that Dr Lamonte Sakai rec's that she start back on Crestor. Nothing further needed.

## 2014-10-24 ENCOUNTER — Telehealth: Payer: Self-pay | Admitting: Family Medicine

## 2014-10-24 NOTE — Telephone Encounter (Signed)
Pt would like to know of you have samples of rivaroxaban (XARELTO) 20 MG TABS tablet

## 2014-10-25 ENCOUNTER — Ambulatory Visit (HOSPITAL_COMMUNITY): Payer: BLUE CROSS/BLUE SHIELD | Attending: Emergency Medicine

## 2014-10-25 DIAGNOSIS — R0609 Other forms of dyspnea: Secondary | ICD-10-CM | POA: Diagnosis not present

## 2014-10-25 MED ORDER — RIVAROXABAN 20 MG PO TABS: 20.0000 mg | ORAL_TABLET | Freq: Every day | ORAL | Status: DC

## 2014-10-25 NOTE — Telephone Encounter (Signed)
Samples ready for pick up and Left message on machine for patient.

## 2014-10-28 ENCOUNTER — Other Ambulatory Visit: Payer: Self-pay | Admitting: Cardiovascular Disease

## 2014-10-28 ENCOUNTER — Other Ambulatory Visit: Payer: Self-pay | Admitting: Internal Medicine

## 2014-10-31 ENCOUNTER — Other Ambulatory Visit: Payer: Self-pay | Admitting: *Deleted

## 2014-10-31 NOTE — Telephone Encounter (Signed)
Rx(s) sent to pharmacy electronically.  

## 2014-11-03 DIAGNOSIS — R0600 Dyspnea, unspecified: Secondary | ICD-10-CM | POA: Diagnosis not present

## 2014-11-08 ENCOUNTER — Telehealth: Payer: Self-pay | Admitting: *Deleted

## 2014-11-08 MED ORDER — CARVEDILOL 25 MG PO TABS: 12.5000 mg | ORAL_TABLET | Freq: Two times a day (BID) | ORAL | Status: DC

## 2014-11-08 NOTE — Telephone Encounter (Signed)
LM with stress test results.  Instructed to decrease cardvedilol to 12.5mg  twice a day.  Asked that she call to verify she received this message.

## 2014-11-08 NOTE — Telephone Encounter (Signed)
-----   Message from [name], MD sent at 11/04/2014  5:26 PM EDT ----- Let's try reducing carvedilol to 12.5 mg BID and see if she feels

## 2014-11-09 ENCOUNTER — Encounter: Payer: Self-pay | Admitting: Cardiovascular Disease

## 2014-11-09 ENCOUNTER — Telehealth: Payer: Self-pay | Admitting: Cardiovascular Disease

## 2014-11-09 ENCOUNTER — Ambulatory Visit (INDEPENDENT_AMBULATORY_CARE_PROVIDER_SITE_OTHER): Payer: BLUE CROSS/BLUE SHIELD | Admitting: *Deleted

## 2014-11-09 DIAGNOSIS — I480 Paroxysmal atrial fibrillation: Secondary | ICD-10-CM

## 2014-11-09 NOTE — Telephone Encounter (Signed)
Mrs.Debo is returning your call .Marland Kitchen

## 2014-11-10 ENCOUNTER — Telehealth: Payer: Self-pay | Admitting: *Deleted

## 2014-11-10 MED ORDER — RIVAROXABAN 20 MG PO TABS: 20.0000 mg | ORAL_TABLET | Freq: Every day | ORAL | Status: DC

## 2014-11-10 NOTE — Telephone Encounter (Signed)
Patient did receive my message to decrease coreg to 12.5mg  bid.  Refills sent in electronically for xarelto 20mg .

## 2014-11-10 NOTE — Progress Notes (Signed)
Remote pacemaker transmission.   

## 2014-11-10 NOTE — Telephone Encounter (Signed)
-----   Message from [name], MD sent at 11/04/2014  5:26 PM EDT ----- Let's try reducing carvedilol to 12.5 mg BID and see if she feels

## 2014-11-14 ENCOUNTER — Encounter: Payer: Self-pay | Admitting: Cardiovascular Disease

## 2014-11-15 MED ORDER — RIVAROXABAN 20 MG PO TABS: 20.0000 mg | ORAL_TABLET | Freq: Every day | ORAL | Status: DC

## 2014-11-15 NOTE — Telephone Encounter (Signed)
xarelto Rx refilled.

## 2014-11-16 ENCOUNTER — Encounter: Payer: Self-pay | Admitting: Emergency Medicine

## 2014-11-16 ENCOUNTER — Ambulatory Visit (INDEPENDENT_AMBULATORY_CARE_PROVIDER_SITE_OTHER): Payer: BLUE CROSS/BLUE SHIELD | Admitting: Emergency Medicine

## 2014-11-16 VITALS — BP 104/60 | HR 99 | Ht 65.0 in | Wt 155.8 lb

## 2014-11-16 DIAGNOSIS — R0609 Other forms of dyspnea: Secondary | ICD-10-CM

## 2014-11-16 NOTE — Progress Notes (Signed)
Subjective:    Patient ID: [name], female    DOB: 1950-10-31, 64 y.o.   MRN: [id number]  HPI Pleasant 64 year old woman, former smoker (10 pack years), followed by Dr Sallyanne Kuster for hypertension, atrial fibrillation, coronary artery disease with an ischemic cardiomyopathy status post CABG and then biventricular pacer in 2014. Also with a history of diabetes, allergic rhinitis, breast cancer. She reports that she has been experiencing dyspnea since about mid 12/'15. Noticed more dyspnea with shopping, has had to stop to rest on a few occasions. Has been about the same over the last 6 months. She is still able to walk about 1.5 miles, but definitely not as well as previously. She  Has no associated CP or pressure. No wheezing. No cough. She does sometimes sneeze. She is relieved by resting. Her wt has fluctuated up and down about 5 lbs.                   Her diuretics were adjusted in march due to concern for hypovolemia. Her dry wt has gone up slightly, about 5 lbs. Dr C also changed her statin to crestor for aching and possibly dyspnea. They are pleased that her pacer is working correctly and efficiently.   I personally reviewed the patient's chest x-ray in February 2016, CT scan of the chest from January 2015. There are no appreciable parenchymal abnormalities. I reviewed the cardiology notes, echocardiogram results from 05/09/14 which show an LVEF of 50% and evidence for grade 1 diastolic dysfunction. There was no evidence for right ventricular dysfunction or pulmonary hypertension.   ROV 09/30/14 -- follow-up visit for dyspnea in the setting of known cardiac disease outlined above. She underwent pulmonary function testing today that I reviewed myself. This shows normal airflow without a bronchodilator response. There may be a mild suggestion of obstructive lung disease based on the curve for flow volume loop. Her lung volumes were normal and her diffusion capacity was normal. She unfortunately  tells me that she is feeling worse, feels that her legs get weak, breathing keeps her from proceeding. She sometimes gets SOB talking.   ROV 11/16/14 -- follow-up visit for evaluation of dyspnea. She has known coronary disease with ischemic cardiomyopathy, hypertension, atrial fibrillation. Because she had possible mild active lung disease on pulmonary function testing that seemed out of proportion to her symptoms I asked her to undergo cardiopulmonary issues as test. This was performed on 10/25/14 and I have reviewed the results. She had mild to moderate decreased functional capacity compared to sedentary norms, and the most remarkable aspect of her testing was chronotropic incompetence in the setting of maximal exercise. Her carvedilol has therefore been decreased by Dr Avon Gully to 12.5 bid. She hasn't noticed a difference in her dyspnea yet. She has started exercising / walking more, but she hasn't noticed an improvement in SOB yet.   Review of Systems  Constitutional: Negative for fever and unexpected weight change.  HENT: Negative for congestion, dental problem, ear pain, nosebleeds, postnasal drip, rhinorrhea, sinus pressure, sneezing, sore throat and trouble swallowing.   Eyes: Negative for redness and itching.  Respiratory: Positive for shortness of breath. Negative for cough, chest tightness and wheezing.   Cardiovascular: Negative for palpitations and leg swelling.  Gastrointestinal: Negative for nausea and vomiting.  Genitourinary: Negative for dysuria.  Musculoskeletal: Negative for joint swelling.  Skin: Negative for rash.  Neurological: Negative for headaches.  Hematological: Does not bruise/bleed easily.  Psychiatric/Behavioral: Negative for dysphoric mood. The patient is not  nervous/anxious.        Objective:   Physical Exam Filed Vitals:   11/16/14 1626  BP: 104/60  Pulse: 99  Height: 5\' 5"  (1.651 m)  Weight: 155 lb 12.8 oz (70.67 kg)  SpO2: 97%   Gen: Pleasant,  well-nourished, in no distress,  normal affect  ENT: No lesions,  mouth clear,  oropharynx clear, no postnasal drip  Neck: No JVD, no TMG, no carotid bruits  Lungs: No use of accessory muscles, distant BS, clear without rales or rhonchi  Cardiovascular: RRR, heart sounds normal, no murmur or gallops, no peripheral edema  Musculoskeletal: No deformities, no cyanosis or clubbing  Neuro: alert, non focal  Skin: Warm, no lesions or rashes   TTE 05/09/14 --  Study Conclusions - Left ventricle: The cavity size was normal. Wall thickness was normal. Systolic function was normal. The estimated ejection fraction was in the range of 50% to 55%. Doppler parameters are consistent with abnormal left ventricular relaxation (grade 1 diastolic dysfunction). Normal RV size and fxn. Normal PAP's    CXR 05/06/14 --  COMPARISON: 10/23/2013 FINDINGS: The heart size and mediastinal contours are normal. Vascular pattern is normal. Lungs are clear. Cardiac pacer identified with leads in unchanged position in generator over left thorax as previously noted. IMPRESSION: No active cardiopulmonary disease     Assessment & Plan:  Exertional dyspnea There appeared to be a chronotropic limitation identified on her cardio pulmonary exercise testing. Her pretest spirometry was normal. And she had ventilatory  reserve at peak exercise. She is working with cardiology to adjust medications. I also have asked her to slowly push her exercise regimen to work on her cardiopulmonary conditioning. I will follow with her as needed.

## 2014-11-16 NOTE — Assessment & Plan Note (Signed)
There appeared to be a chronotropic limitation identified on her cardio pulmonary exercise testing. Her pretest spirometry was normal. And she had ventilatory  reserve at peak exercise. She is working with cardiology to adjust medications. I also have asked her to slowly push her exercise regimen to work on her cardiopulmonary conditioning. I will follow with her as needed.

## 2014-11-16 NOTE — Patient Instructions (Signed)
Your Cardiopulmonary exercise test does not identify an abnormality in your lung function or airflow.  I agree with working with Dr Sallyanne Kuster to adjust medications and to slowly increase your exercise tolerance.  Please follow up with Dr Lamonte Sakai for any change in your breathing

## 2014-11-17 LAB — CUP PACEART REMOTE DEVICE CHECK
Battery Remaining Longevity: 80 mo
Brady Statistic AP VP Percent: 1 %
Brady Statistic AP VS Percent: 1 %
Brady Statistic AS VS Percent: 1 %
Brady Statistic RA Percent Paced: 1 %
Date Time Interrogation Session: 20160901012546
Lead Channel Impedance Value: 410 Ohm
Lead Channel Impedance Value: 410 Ohm
Lead Channel Pacing Threshold Amplitude: 1 V
Lead Channel Pacing Threshold Amplitude: 1.25 V
Lead Channel Pacing Threshold Pulse Width: 0.4 ms
Lead Channel Pacing Threshold Pulse Width: 0.4 ms
Lead Channel Pacing Threshold Pulse Width: 0.5 ms
Lead Channel Sensing Intrinsic Amplitude: 12 mV
Lead Channel Setting Pacing Amplitude: 2.25 V
Lead Channel Setting Pacing Pulse Width: 0.4 ms
Lead Channel Setting Pacing Pulse Width: 0.5 ms
MDC IDC MSMT BATTERY REMAINING PERCENTAGE: 95.5 %
MDC IDC MSMT BATTERY VOLTAGE: 2.96 V
MDC IDC MSMT LEADCHNL RA PACING THRESHOLD AMPLITUDE: 0.75 V
MDC IDC MSMT LEADCHNL RA SENSING INTR AMPL: 1.7 mV
MDC IDC MSMT LEADCHNL RV IMPEDANCE VALUE: 480 Ohm
MDC IDC PG SERIAL: 2981305
MDC IDC SET LEADCHNL RA PACING AMPLITUDE: 2 V
MDC IDC SET LEADCHNL RV PACING AMPLITUDE: 2.5 V
MDC IDC SET LEADCHNL RV SENSING SENSITIVITY: 2 mV
MDC IDC STAT BRADY AS VP PERCENT: 99 %
Pulse Gen Model: 3222

## 2014-11-25 ENCOUNTER — Encounter: Payer: Self-pay | Admitting: Cardiology

## 2014-12-01 ENCOUNTER — Ambulatory Visit: Payer: BLUE CROSS/BLUE SHIELD | Admitting: Emergency Medicine

## 2014-12-05 ENCOUNTER — Encounter: Payer: Self-pay | Admitting: Family Medicine

## 2014-12-23 ENCOUNTER — Ambulatory Visit: Payer: BLUE CROSS/BLUE SHIELD | Admitting: Internal Medicine

## 2014-12-25 ENCOUNTER — Other Ambulatory Visit: Payer: Self-pay | Admitting: Internal Medicine

## 2014-12-27 ENCOUNTER — Other Ambulatory Visit: Payer: Self-pay | Admitting: *Deleted

## 2014-12-27 MED ORDER — DULAGLUTIDE 1.5 MG/0.5ML ~~LOC~~ SOAJ: 1.5000 mg | SUBCUTANEOUS | Status: DC

## 2014-12-27 NOTE — Telephone Encounter (Signed)
Ins would not cover Bydureon. Change to Trulicity 1.5 mg weekly, per Dr Lilia Argue.

## 2014-12-29 ENCOUNTER — Encounter: Payer: Self-pay | Admitting: Cardiovascular Disease

## 2014-12-29 ENCOUNTER — Ambulatory Visit (INDEPENDENT_AMBULATORY_CARE_PROVIDER_SITE_OTHER): Payer: BLUE CROSS/BLUE SHIELD | Admitting: Cardiovascular Disease

## 2014-12-29 VITALS — BP 114/66 | HR 85 | Ht 64.0 in | Wt 155.0 lb

## 2014-12-29 DIAGNOSIS — E785 Hyperlipidemia, unspecified: Secondary | ICD-10-CM

## 2014-12-29 DIAGNOSIS — I25810 Atherosclerosis of coronary artery bypass graft(s) without angina pectoris: Secondary | ICD-10-CM

## 2014-12-29 DIAGNOSIS — Z79899 Other long term (current) drug therapy: Secondary | ICD-10-CM

## 2014-12-29 DIAGNOSIS — Z950 Presence of cardiac pacemaker: Secondary | ICD-10-CM

## 2014-12-29 DIAGNOSIS — I255 Ischemic cardiomyopathy: Secondary | ICD-10-CM

## 2014-12-29 DIAGNOSIS — I5042 Chronic combined systolic (congestive) and diastolic (congestive) heart failure: Secondary | ICD-10-CM

## 2014-12-29 DIAGNOSIS — I10 Essential (primary) hypertension: Secondary | ICD-10-CM

## 2014-12-29 DIAGNOSIS — Z951 Presence of aortocoronary bypass graft: Secondary | ICD-10-CM

## 2014-12-29 DIAGNOSIS — I5043 Acute on chronic combined systolic (congestive) and diastolic (congestive) heart failure: Secondary | ICD-10-CM

## 2014-12-29 DIAGNOSIS — I480 Paroxysmal atrial fibrillation: Secondary | ICD-10-CM

## 2014-12-29 NOTE — Progress Notes (Signed)
Patient ID: [name], female   DOB: [birth year], 64 y.o.   MRN: [id number]     Cardiology Office Note   Date:  12/29/2014   ID:  [name], DOB November 10, 1950, MRN [id number]  PCP:  Joycelyn Man, MD  Cardiologist:   [name], MD   Chief Complaint  Patient presents with  . 6 months  . Shortness of Breath  . Edema      History of Present Illness: [name] is a 64 y.o. female who presents for  Follow-up for coronary artery disease,  [place] atrial fibrillation and CRT- pacemaker for combined systolic and diastolic heart failure and left bundle branch block.  She is feeling quite well. She is tolerating Crestor much better than her previous statins, although she still has some aches and pains. She has not had signs or symptoms of congestive heart failure and denies angina pectoris. Glycemic control is fair.  Interrogation of her St. Jude biventricular pacemaker shows normal function. Thoracic impedance/corvue is in normal range. She has better than 99% biventricular pacing and an excellent heart rate histogram distribution. All lead parameters are favorable.  The only episode of mode switch was atrial tachycardia lasting less than 4 seconds on one occasion.  Mrs. [name] has a long-standing history of coronary artery disease and its complications. She had an initial presentation with myocardial infarction in 2004 and then in May 2014 presented with congestive heart failure and atrial fibrillation in the setting of three-vessel coronary artery disease. She underwent bypass surgery (July 2014, Dr. Servando Snare, LIMA to LAD, SVG to ramus intermedius, sequential SVG to OM1 and OM 2, surgical maze procedure) but had persistent depressed left ventricular systolic function and received a dual-chamber biventricular pacemaker (St. Jude Allure, Dr. Lovena Le in November 2014). She has type 2 diabetes mellitus that has been difficult to control as well as hypertension and hyperlipidemia. In  generates 2015 she was admitted with acute on chronic heart failure due to excessive intravenous fluid administration during an episode of small bowel obstruction. Last ejection fraction assessment was before she received her CRT-P device showed an EF of 35-40%. Follow-up echo February 2016 shows left ventricular ejection fraction 50-55 percent.  Past Medical History  Diagnosis Date  . Diabetes mellitus type II   . Hyperlipidemia   . Hypertension   . Allergic rhinitis   . Cataract   . CAD (coronary artery disease)     a. 2004: s/p MI in [place]. No PCI->Medical RX;  b. 07/2012 Cath: LM 30-40, LAD 70p, 70/56m, D1 80-90p, OM1 small 90p, OM2 large 80-90p, 42m, 70-80d, RCA 20-30 diff, EF 40%, glob HK.s/p CABG  . Ischemic cardiomyopathy     a. 07/2012 Echo: EF 35%, Sev inferoseptal HK, mildly dil LA, Peak PASP 4mmHg.  Marland Kitchen PAF (paroxysmal atrial fibrillation) ([place])     a. 07/2012: Amio and xarelto initiated.  Marland Kitchen LBBB (left bundle branch block)     a. intermittent - present during rapid afib 07/2012.  . Breast cancer [facility]) 2002    Patient reports left breast cancer diagnosis in 2002 treated with bilateral mastectomy positive lymph nodes with left axillary dissection followed by chemotherapy of unknown type  . Neuromuscular disorder ([place])     Patient reports chronic numbness in the right foot related to previous surgery on the right leg and "nerve damage"  . Shortness of breath   . Biventricular cardiac pacemaker - St Jude, Nov 2014 03/04/2013  . SBO (small bowel obstruction) [facility])     Past Surgical  History  Procedure Laterality Date  . Mastectomy  2002    bilateral  . Tubal ligation  1987  . Bladder surgery  2000  . Right leg surgery  2001    torn ligaments and tendons x4 surgeries  . Appendectomy  1974  . Cholecystectomy  1974  . Abdominal hysterectomy  2000  . Breast surgery      Bilateral mastectomy for left breast cancer unknown stage but patient reports positive nodes was treated with  chemotherapy  . Abdominal hysterectomy    . Coronary artery bypass graft N/A 09/28/2012    Procedure: CORONARY ARTERY BYPASS GRAFTING (CABG);  Surgeon: [name], MD;  Location: [place];  Service: Open Heart Surgery;  Laterality: N/A;  CABG x four, using left internal mammary artery and left leg greater saphenous vein harvested endoscopically  . Maze N/A 09/28/2012    Procedure: MAZE;  Surgeon: [name], MD;  Location: [place];  Service: Open Heart Surgery;  Laterality: N/A;  . Intraoperative transesophageal echocardiogram N/A 09/28/2012    Procedure: INTRAOPERATIVE TRANSESOPHAGEAL ECHOCARDIOGRAM;  Surgeon: [name], MD;  Location: [place];  Service: Open Heart Surgery;  Laterality: N/A;  . Epicardial pacing lead placement N/A 09/28/2012    Procedure: EPICARDIAL PACING LEAD PLACEMENT;  Surgeon: [name], MD;  Location: [place];  Service: Thoracic;  Laterality: N/A;  LV LEAD PLACEMENT  . Bi-ventricular pacemaker insertion (crt-p)  01/25/2013    Dr Lovena Le ([facility])  . Left heart catheterization with coronary angiogram N/A 07/20/2012    Procedure: LEFT HEART CATHETERIZATION WITH CORONARY ANGIOGRAM;  Surgeon: [name], MD;  Location: [facility] C[facility] LAB;  Service: Cardiovascular;  Laterality: N/A;  . Bi-ventricular pacemaker insertion N/A 01/25/2013    Procedure: BI-VENTRICULAR PACEMAKER INSERTION (CRT-P);  Surgeon: Evans Lance, MD;  Location: [facility] CATH LAB;  Service: Cardiovascular;  Laterality: N/A;     Current Outpatient Prescriptions  Medication Sig Dispense Refill  . aspirin 81 MG tablet Take 1 tablet (81 mg total) by mouth daily. 30 tablet   . carvedilol (COREG) 25 MG tablet Take 0.5 tablets (12.5 mg total) by mouth 2 (two) times daily. 60 tablet 2  . Dulaglutide (TRULICITY) 1.5 KN/3.9JQ SOPN Inject 1.5 mg into the skin once a week. 4 pen 2  . glipiZIDE (GLUCOTROL) 5 MG tablet Take 0.5 tablets (2.5 mg total) by mouth 2 (two) times daily before a meal. 28 tablet 3  .  glucose blood (ONETOUCH VERIO) test strip Check 2 times daily. Use as instructed 200 each 3  . INVOKANA 100 MG TABS tablet TAKE 1 TABLET BY MOUTH EVERY MORNING 30 tablet 9  . losartan (COZAAR) 25 MG tablet Take 1 tablet (25 mg total) by mouth 2 (two) times daily. 180 tablet 2  . metFORMIN (GLUCOPHAGE) 1000 MG tablet TAKE 1 TABLET (1,000 MG TOTAL) BY MOUTH 2 (TWO) TIMES DAILY WITH A MEAL. 60 tablet 2  . nitroGLYCERIN (NITROSTAT) 0.4 MG SL tablet Place 1 tablet (0.4 mg total) under the tongue every 5 (five) minutes as needed for chest pain. 25 tablet 3  . rivaroxaban (XARELTO) 20 MG TABS tablet Take 1 tablet (20 mg total) by mouth daily with supper. 30 tablet 5  . rosuvastatin (CRESTOR) 10 MG tablet Take 1 tablet (10 mg total) by mouth daily. 90 tablet 3   No current facility-administered medications for this visit.    Allergies:   Review of patient's allergies indicates no known allergies.    Social History:  The patient  reports that she quit smoking about 2 years ago. Her smoking use included Cigarettes. She has a 10 pack-year smoking history. She has never used smokeless tobacco. She reports that she does not drink alcohol or use illicit drugs.   Family History:  The patient's family history includes Arthritis in her other; Cancer in her other; Colon cancer in her sister; Diabetes in her other; Heart disease in her mother; Hyperlipidemia in her other; Kidney failure in her mother and other.    ROS:  Please see the history of present illness.    Otherwise, review of systems positive for none.   All other systems are reviewed and negative.    PHYSICAL EXAM: VS:  BP 114/66 mmHg  Pulse 85  Ht 5\' 4"  (1.626 m)  Wt 155 lb (70.308 kg)  BMI 26.59 kg/m2 , BMI Body mass index is 26.59 kg/(m^2).  General: Alert, oriented x3, no distress Head: no evidence of trauma, PERRL, EOMI, no exophtalmos or lid lag, no myxedema, no xanthelasma; normal ears, nose and oropharynx Neck: normal jugular venous  pulsations and no hepatojugular reflux; brisk carotid pulses without delay and no carotid bruits Chest: clear to auscultation, no signs of consolidation by percussion or palpation, normal fremitus, symmetrical and full respiratory excursions,  Healthy pacemaker site and sternotomy scar Cardiovascular: normal position and quality of the apical impulse, regular rhythm, normal first and second heart sounds, no murmurs, rubs or gallops Abdomen: no tenderness or distention, no masses by palpation, no abnormal pulsatility or arterial bruits, normal bowel sounds, no hepatosplenomegaly Extremities: no clubbing, cyanosis or edema; 2+ radial, ulnar and brachial pulses bilaterally; 2+ right femoral, posterior tibial and dorsalis pedis pulses; 2+ left femoral, posterior tibial and dorsalis pedis pulses; no subclavian or femoral bruits Neurological: grossly nonfocal Psych: euthymic mood, full affect   EKG:  EKG is ordered today. The ekg ordered today demonstrates  Atrial sensed biventricular paced, QRS 130 ms with prominent R wave in lead V1, QTC 528 ms   Recent Labs: 04/07/2014: ALT 19; Hemoglobin 12.4; Platelets 240.0; TSH 1.59 04/27/2014: BUN 16; Creat 0.62; Potassium 4.8; Sodium 140    Lipid Panel    Component Value Date/Time   CHOL 110 04/07/2014 0820   TRIG 82.0 04/07/2014 0820   HDL 55.70 04/07/2014 0820   CHOLHDL 2 04/07/2014 0820   VLDL 16.4 04/07/2014 0820   LDLCALC 38 04/07/2014 0820   LDLDIRECT 134.3 09/19/2009 1436      Wt Readings from Last 3 Encounters:  12/29/14 155 lb (70.308 kg)  11/16/14 155 lb 12.8 oz (70.67 kg)  09/30/14 153 lb (69.4 kg)     .   ASSESSMENT AND PLAN:  1.  Combined systolic and diastolic heart failure, euvolemic by both clinical criteria and thoracic impedance despite no diuretic therapy , NYHA functional class I , EF has improved to almost normal range following resynchronization pacing  2.  CAD status post CABG, asymptomatic  3.  Systemic  hypertension well controlled  4.  Paroxysmal atrial fibrillation with infrequent events, on appropriate chronic anticoagulation with Xarelto. No bleeding complications or focal neurological events recorded.CHADSVasc 4.  5.  Hyperlipidemia with statin induced myalgia. Tolerating Crestor better. Recheck labs now that her glycemic control is so much better. We may be able to reduce the statin dose.    Current medicines are reviewed at length with the patient today.  The patient does not have concerns regarding medicines.  The following changes have been made:  no change  Labs/ tests ordered today include:  Orders Placed This Encounter  Procedures  . Lipid panel  . Comprehensive metabolic panel  . EKG 12-Lead    Patient Instructions  Your physician recommends that you return for lab work in: FASTING (lipid, CMET)  Your physician wants you to follow-up in: 1 year with Dr. Sallyanne Kuster. You will receive a reminder letter in the mail two months in advance. If you don't receive a letter, please call our office to schedule the follow-up appointment.  If you need a refill on your cardiac medications before your next appointment, please call your pharmacy.        Mikael Spray, MD  12/29/2014 9:03 AM    [name], MD, [facility] [phone] office [phone] pager

## 2014-12-29 NOTE — Patient Instructions (Signed)
Your physician recommends that you return for lab work in: FASTING (lipid, CMET)  Your physician wants you to follow-up in: 1 year with Dr. Sallyanne Kuster. You will receive a reminder letter in the mail two months in advance. If you don't receive a letter, please call our office to schedule the follow-up appointment.  If you need a refill on your cardiac medications before your next appointment, please call your pharmacy.

## 2014-12-30 ENCOUNTER — Telehealth: Payer: Self-pay | Admitting: *Deleted

## 2014-12-30 NOTE — Telephone Encounter (Signed)
Ins would not cover bydureon or trulicity. Please advise dosage of Tanzeum. Thank you.

## 2014-12-30 NOTE — Telephone Encounter (Signed)
I need to see her first to check her sugars. Is she out of Bydureon completely?If so, we can send a box of 50 mg pens for now.

## 2015-01-02 NOTE — Telephone Encounter (Signed)
Please send I box of Tanzeum 50 mg sq weekly with 2 refills

## 2015-01-02 NOTE — Telephone Encounter (Signed)
Ins will not cover Bydureon.

## 2015-01-03 NOTE — Telephone Encounter (Signed)
Ins prefers Victoza or Byetta. Please advise.

## 2015-01-03 NOTE — Addendum Note (Signed)
Addended by: Rockie Neighbours B on: 01/03/2015 03:52 PM   Modules accepted: Orders, Medications

## 2015-01-03 NOTE — Telephone Encounter (Signed)
Let's use Victoza  - however, this is once a day. Let's in the box of 3 pens with one refill. Started 0.6 mg daily in the morning, before breakfast, for 1 week; then increase to 1.2 mg daily for another week; if sugars still not well controlled can go up to 1.8 mg daily and remain on this dose.

## 2015-01-04 ENCOUNTER — Telehealth: Payer: Self-pay | Admitting: *Deleted

## 2015-01-04 MED ORDER — LIRAGLUTIDE 18 MG/3ML ~~LOC~~ SOPN: PEN_INJECTOR | SUBCUTANEOUS | Status: DC

## 2015-01-04 NOTE — Telephone Encounter (Signed)
Pt called back stating that she spoke with her pharmacy. Her ins is going to cover the Trulicity now. The cost to her is only $25 a month. Pt stated she prefers to take the Trulicity over the Victoza b/c she will only have to do the inj once a week. Pt is cancelling the Victoza and picking up the Trulicity. Be advised.

## 2015-01-04 NOTE — Addendum Note (Signed)
Addended by: Rockie Neighbours B on: 01/04/2015 02:07 PM   Modules accepted: Orders

## 2015-01-04 NOTE — Telephone Encounter (Signed)
Called pt and lvm advising her per Dr Arman Filter message. Advised pt to return call if she has any questions.

## 2015-01-05 NOTE — Telephone Encounter (Signed)
OK 

## 2015-01-07 ENCOUNTER — Other Ambulatory Visit: Payer: Self-pay | Admitting: Internal Medicine

## 2015-01-09 ENCOUNTER — Other Ambulatory Visit: Payer: Self-pay

## 2015-01-25 ENCOUNTER — Other Ambulatory Visit: Payer: Self-pay | Admitting: Cardiovascular Disease

## 2015-01-25 NOTE — Telephone Encounter (Signed)
Called patient to ask if she was still taking her LASIX because it had not been on her medication list in the last two office visits. She informed me that she had stopped taking it for a while, but has had some issues lately and is back on it.

## 2015-01-27 ENCOUNTER — Ambulatory Visit: Payer: BLUE CROSS/BLUE SHIELD | Admitting: Cardiovascular Disease

## 2015-02-05 ENCOUNTER — Other Ambulatory Visit: Payer: Self-pay | Admitting: Cardiovascular Disease

## 2015-02-05 ENCOUNTER — Other Ambulatory Visit: Payer: Self-pay | Admitting: Internal Medicine

## 2015-02-06 NOTE — Telephone Encounter (Signed)
Rx(s) sent to pharmacy electronically.  

## 2015-02-11 ENCOUNTER — Other Ambulatory Visit: Payer: Self-pay | Admitting: Internal Medicine

## 2015-02-15 LAB — CUP PACEART INCLINIC DEVICE CHECK
Implantable Lead Implant Date: 20140721
Implantable Lead Implant Date: 20141117
Implantable Lead Location: 753860
Implantable Lead Model: 5071
MDC IDC LEAD IMPLANT DT: 20141117
MDC IDC LEAD LOCATION: 753858
MDC IDC LEAD LOCATION: 753859
MDC IDC PG SERIAL: 2981305
MDC IDC SESS DTM: 20161207110623

## 2015-02-28 ENCOUNTER — Encounter: Payer: Self-pay | Admitting: Internal Medicine

## 2015-03-04 ENCOUNTER — Other Ambulatory Visit: Payer: Self-pay | Admitting: Internal Medicine

## 2015-03-07 ENCOUNTER — Other Ambulatory Visit: Payer: Self-pay | Admitting: *Deleted

## 2015-03-07 MED ORDER — GLIPIZIDE 5 MG PO TABS: ORAL_TABLET | ORAL | Status: DC

## 2015-03-28 ENCOUNTER — Encounter: Payer: BC Managed Care – PPO | Admitting: Family Medicine

## 2015-04-05 ENCOUNTER — Ambulatory Visit (INDEPENDENT_AMBULATORY_CARE_PROVIDER_SITE_OTHER): Payer: BLUE CROSS/BLUE SHIELD | Admitting: *Deleted

## 2015-04-05 DIAGNOSIS — I255 Ischemic cardiomyopathy: Secondary | ICD-10-CM | POA: Diagnosis not present

## 2015-04-05 DIAGNOSIS — I5042 Chronic combined systolic (congestive) and diastolic (congestive) heart failure: Secondary | ICD-10-CM

## 2015-04-05 NOTE — Progress Notes (Signed)
Remote pacemaker transmission.   

## 2015-04-10 ENCOUNTER — Ambulatory Visit (INDEPENDENT_AMBULATORY_CARE_PROVIDER_SITE_OTHER): Payer: BLUE CROSS/BLUE SHIELD | Admitting: Family Medicine

## 2015-04-10 ENCOUNTER — Encounter: Payer: Self-pay | Admitting: Family Medicine

## 2015-04-10 VITALS — BP 120/80 | Temp 98.7°F | Ht 64.0 in | Wt 154.0 lb

## 2015-04-10 DIAGNOSIS — E1159 Type 2 diabetes mellitus with other circulatory complications: Secondary | ICD-10-CM | POA: Diagnosis not present

## 2015-04-10 DIAGNOSIS — R0609 Other forms of dyspnea: Secondary | ICD-10-CM

## 2015-04-10 DIAGNOSIS — I252 Old myocardial infarction: Secondary | ICD-10-CM

## 2015-04-10 DIAGNOSIS — E1165 Type 2 diabetes mellitus with hyperglycemia: Secondary | ICD-10-CM | POA: Diagnosis not present

## 2015-04-10 DIAGNOSIS — E785 Hyperlipidemia, unspecified: Secondary | ICD-10-CM

## 2015-04-10 DIAGNOSIS — Z23 Encounter for immunization: Secondary | ICD-10-CM | POA: Diagnosis not present

## 2015-04-10 DIAGNOSIS — I25810 Atherosclerosis of coronary artery bypass graft(s) without angina pectoris: Secondary | ICD-10-CM

## 2015-04-10 DIAGNOSIS — Z0000 Encounter for general adult medical examination without abnormal findings: Secondary | ICD-10-CM | POA: Diagnosis not present

## 2015-04-10 DIAGNOSIS — IMO0002 Reserved for concepts with insufficient information to code with codable children: Secondary | ICD-10-CM

## 2015-04-10 DIAGNOSIS — K432 Incisional hernia without obstruction or gangrene: Secondary | ICD-10-CM

## 2015-04-10 DIAGNOSIS — Z853 Personal history of malignant neoplasm of breast: Secondary | ICD-10-CM

## 2015-04-10 DIAGNOSIS — I480 Paroxysmal atrial fibrillation: Secondary | ICD-10-CM

## 2015-04-10 DIAGNOSIS — I10 Essential (primary) hypertension: Secondary | ICD-10-CM

## 2015-04-10 LAB — HEPATIC FUNCTION PANEL
ALK PHOS: 68 U/L (ref 39–117)
ALT: 16 U/L (ref 0–35)
AST: 15 U/L (ref 0–37)
Albumin: 4.8 g/dL (ref 3.5–5.2)
BILIRUBIN DIRECT: 0.2 mg/dL (ref 0.0–0.3)
BILIRUBIN TOTAL: 0.8 mg/dL (ref 0.2–1.2)
Total Protein: 8.1 g/dL (ref 6.0–8.3)

## 2015-04-10 LAB — CBC WITH DIFFERENTIAL/PLATELET
BASOS PCT: 0.4 % (ref 0.0–3.0)
Basophils Absolute: 0 10*3/uL (ref 0.0–0.1)
EOS PCT: 2.3 % (ref 0.0–5.0)
Eosinophils Absolute: 0.2 10*3/uL (ref 0.0–0.7)
HCT: 40.2 % (ref 36.0–46.0)
Hemoglobin: 13.3 g/dL (ref 12.0–15.0)
Lymphocytes Relative: 19.9 % (ref 12.0–46.0)
Lymphs Abs: 1.7 10*3/uL (ref 0.7–4.0)
MCHC: 33.2 g/dL (ref 30.0–36.0)
MCV: 89.1 fl (ref 78.0–100.0)
MONO ABS: 0.3 10*3/uL (ref 0.1–1.0)
Monocytes Relative: 4.1 % (ref 3.0–12.0)
NEUTROS ABS: 6.1 10*3/uL (ref 1.4–7.7)
NEUTROS PCT: 73.3 % (ref 43.0–77.0)
Platelets: 247 10*3/uL (ref 150.0–400.0)
RBC: 4.51 Mil/uL (ref 3.87–5.11)
RDW: 13.3 % (ref 11.5–15.5)
WBC: 8.4 10*3/uL (ref 4.0–10.5)

## 2015-04-10 LAB — LIPID PANEL
Cholesterol: 114 mg/dL (ref 0–200)
HDL: 56.6 mg/dL (ref >39.00)
LDL Cholesterol: 36 mg/dL (ref 0–99)
NONHDL: 57.77
Total CHOL/HDL Ratio: 2
Triglycerides: 111 mg/dL (ref 0.0–149.0)
VLDL: 22.2 mg/dL (ref 0.0–40.0)

## 2015-04-10 LAB — POCT URINALYSIS DIPSTICK
BILIRUBIN UA: NEGATIVE
Blood, UA: NEGATIVE
KETONES UA: NEGATIVE
Leukocytes, UA: NEGATIVE
Nitrite, UA: NEGATIVE
PH UA: 6
PROTEIN UA: NEGATIVE
Urobilinogen, UA: 0.2

## 2015-04-10 LAB — BASIC METABOLIC PANEL
BUN: 19 mg/dL (ref 6–23)
CHLORIDE: 101 meq/L (ref 96–112)
CO2: 28 mEq/L (ref 19–32)
Calcium: 10.1 mg/dL (ref 8.4–10.5)
Creatinine, Ser: 0.73 mg/dL (ref 0.40–1.20)
GFR: 85.55 mL/min (ref >60.00)
Glucose, Bld: 146 mg/dL — ABNORMAL HIGH (ref 70–99)
POTASSIUM: 4.9 meq/L (ref 3.5–5.1)
SODIUM: 143 meq/L (ref 135–145)

## 2015-04-10 LAB — MICROALBUMIN / CREATININE URINE RATIO
CREATININE, U: 11 mg/dL
Microalb Creat Ratio: 6.3 mg/g (ref 0.0–30.0)
Microalb, Ur: <0.7 mg/dL (ref 0.0–1.9)

## 2015-04-10 LAB — TSH: TSH: 1.05 u[IU]/mL (ref 0.35–4.50)

## 2015-04-10 LAB — HEMOGLOBIN A1C: Hgb A1c MFr Bld: 7.7 % — ABNORMAL HIGH (ref 4.6–6.5)

## 2015-04-10 MED ORDER — GLIPIZIDE 5 MG PO TABS: ORAL_TABLET | ORAL | Status: DC

## 2015-04-10 MED ORDER — LOSARTAN POTASSIUM 25 MG PO TABS: 25.0000 mg | ORAL_TABLET | Freq: Two times a day (BID) | ORAL | Status: DC

## 2015-04-10 MED ORDER — ROSUVASTATIN CALCIUM 10 MG PO TABS: 10.0000 mg | ORAL_TABLET | Freq: Every day | ORAL | Status: DC

## 2015-04-10 MED ORDER — CARVEDILOL 25 MG PO TABS: 12.5000 mg | ORAL_TABLET | Freq: Two times a day (BID) | ORAL | Status: DC

## 2015-04-10 MED ORDER — NITROGLYCERIN 0.4 MG SL SUBL: 0.4000 mg | SUBLINGUAL_TABLET | SUBLINGUAL | Status: DC | PRN

## 2015-04-10 MED ORDER — METFORMIN HCL 1000 MG PO TABS: ORAL_TABLET | ORAL | Status: DC

## 2015-04-10 MED ORDER — FUROSEMIDE 40 MG PO TABS: ORAL_TABLET | ORAL | Status: DC

## 2015-04-10 MED ORDER — ESTROGENS, CONJUGATED 0.625 MG/GM VA CREA: 1.0000 | TOPICAL_CREAM | Freq: Every day | VAGINAL | Status: DC

## 2015-04-10 NOTE — Progress Notes (Signed)
Subjective:    Patient ID: [name], female    DOB: 1950/09/01, 65 y.o.   MRN: [id number]  HPI [name] is a 65 year old married female nonsmoker who comes in today for general physical examination  She has a history of underlying diabetes hypertension hyperlipidemia. In 2014 she had bypass surgery for unstable angina and lesions that were not amenable to PTCA. She's done well since that time and no complications except she has persistent shortness of breath with exertion. She seemed pulmonary they can find no underlying pulmonary cause. I would recommend cardiac rehabilitation. She says her insurance won't pay for cardiac rehabilitation  In 2000 she had a TAH and BSO for some abnormal cervical cells. Subsequent path was benign.  In 2002 she had breast cancer in the left side. She underwent bilateral mastectomy with a TRAM flap. She now has a hernia on the right side of her lower abdomen from previous surgical procedure. At one time she also had an SBO. She's wondering if she could get the hernia 6.  She's had an eye exam recently which showed bilateral cataracts. She needs to have them removed. I recommend Dr. Bing Plume  She does not get recommended dental care,,,,, referred to Dr. Gloriann Loan  She does not get annual mammography despite having the breast cancer in 2002,,,,,,, recommend follow mammography  Colonoscopy 2012 was reported to be normal.  Vaccinations up-to-date except she do a flu shot.  She also sees her endocrinologist her last A1c 6 months ago was 6.7%.  She also saw cardiology in October 2016. She has PAF and is on chronic anticoagulation  She hasn't had a refill of her nitroglycerin in many years.  She says the Crestor gives her muscle and joint pain. I recommend she take it Monday Wednesday Friday  Off her recommend a shingles vaccine Pneumovax in pulmonary rehabilitation because of the shortness of breath.   Review of Systems  Constitutional: Negative.   HENT:  Negative.   Eyes: Negative.   Respiratory: Positive for shortness of breath.   Cardiovascular: Negative.   Gastrointestinal: Negative.   Endocrine: Negative.   Genitourinary: Negative.   Musculoskeletal: Negative.   Skin: Negative.   Allergic/Immunologic: Negative.   Neurological: Negative.   Hematological: Negative.   Psychiatric/Behavioral: Negative.        Objective:   Physical Exam  Constitutional: She is oriented to person, place, and time. She appears well-developed and well-nourished.  HENT:  Head: Normocephalic and atraumatic.  Right Ear: External ear normal.  Left Ear: External ear normal.  Nose: Nose normal.  Mouth/Throat: Oropharynx is clear and moist.  Eyes: EOM are normal. Pupils are equal, round, and reactive to light.  Neck: Normal range of motion. Neck supple. No JVD present. No tracheal deviation present. No thyromegaly present.  Cardiovascular: Normal rate, regular rhythm, normal heart sounds and intact distal pulses.  Exam reveals no gallop and no friction rub.   No murmur heard. Bilateral carotid exam normal. There is reported to be a right carotid bruit that I cannot hear today. Peripheral pulses 2+ and symmetrical  Sinus rhythm today  Pulmonary/Chest: Effort normal and breath sounds normal. No stridor. No respiratory distress. She has no wheezes. She has no rales. She exhibits no tenderness.  Abdominal: Soft. Bowel sounds are normal. She exhibits no distension and no mass. There is no tenderness. There is no rebound and no guarding.  Genitourinary:  Cystic lesion left labia also dry vagina. Recommend hormonal cream  Bilateral breast exam shows the  breasts the been previously removed in toto. No palpable masses. Scar in the anterior abdominal wall and the back of the chest from the TRAM flap. There is also small hernia just below the surgical incision her right lower quadrant.  Musculoskeletal: Normal range of motion.  Lymphadenopathy:    She has no  cervical adenopathy.  Neurological: She is alert and oriented to person, place, and time. She has normal reflexes. No cranial nerve deficit. She exhibits normal muscle tone. Coordination normal.  Skin: Skin is warm and dry. No rash noted. No erythema. No pallor.  Psychiatric: She has a normal mood and affect. Her behavior is normal. Judgment and thought content normal.  Nursing note and vitals reviewed.         Assessment & Plan:  Hypertension at goal........ continue current therapy  Hyperlipidemia goal.......... continue current therapy except decrease Crestor to Monday Wednesday Friday  Diabetes type 2........ check A1c  History of left-sided breast cancer with bilateral mastectomies and TRAM flaps...Marland KitchenMarland KitchenMarland Kitchen recommend BSE monthly and mammography  Status post coronary bypass surgery 2014 asymptomatic except for shortness of breath which is persistent..........Marland Kitchen recommend pulmonary rehabilitation or cardiac rehabilitation  Right lower quadrant hernia below previous surgical incision........ Gen. surgery consult Dr. Donne Hazel  No dental care........Marland Kitchen recommend Dr. Gloriann Loan  Bilateral cataracts........Marland Kitchen recommend Dr. Bing Plume

## 2015-04-10 NOTE — Progress Notes (Signed)
Pre visit review using our clinic review tool, if applicable. No additional management support is needed unless otherwise documented below in the visit note. 

## 2015-04-10 NOTE — Patient Instructions (Signed)
Continue current medications,,,,,,,,,,,, except take the Crestor Monday Wednesday Friday  Labs today  Dr. Gloriann Loan........ dental evaluation  The breast center.......... screening mammography  Pulmonary........... question pulmonary rehabilitation  Bilateral cataracts........Marland Kitchen Dr. Genice Rouge or Almyra Free are 2 new adult nurse practitioner for Dr. Martinique

## 2015-04-19 LAB — CUP PACEART REMOTE DEVICE CHECK
Battery Voltage: 2.98 V
Brady Statistic AP VP Percent: 1 %
Brady Statistic AS VP Percent: 99 %
Brady Statistic RA Percent Paced: 1 %
Date Time Interrogation Session: 20170125090011
Implantable Lead Location: 753859
Implantable Lead Location: 753860
Lead Channel Setting Pacing Amplitude: 2.25 V
Lead Channel Setting Pacing Amplitude: 2.5 V
Lead Channel Setting Sensing Sensitivity: 2 mV
MDC IDC LEAD IMPLANT DT: 20140721
MDC IDC LEAD IMPLANT DT: 20141117
MDC IDC LEAD IMPLANT DT: 20141117
MDC IDC LEAD LOCATION: 753858
MDC IDC MSMT BATTERY REMAINING LONGEVITY: 83 mo
MDC IDC MSMT BATTERY REMAINING PERCENTAGE: 95.5 %
MDC IDC SET LEADCHNL LV PACING PULSEWIDTH: 0.5 ms
MDC IDC SET LEADCHNL RA PACING AMPLITUDE: 2 V
MDC IDC SET LEADCHNL RV PACING PULSEWIDTH: 0.4 ms
MDC IDC STAT BRADY AP VS PERCENT: 1 %
MDC IDC STAT BRADY AS VS PERCENT: 1 %
Pulse Gen Model: 3222
Pulse Gen Serial Number: 2981305

## 2015-04-21 ENCOUNTER — Encounter: Payer: Self-pay | Admitting: Cardiology

## 2015-04-22 ENCOUNTER — Other Ambulatory Visit: Payer: Self-pay | Admitting: Internal Medicine

## 2015-04-29 ENCOUNTER — Other Ambulatory Visit: Payer: Self-pay | Admitting: Cardiovascular Disease

## 2015-05-01 NOTE — Telephone Encounter (Signed)
Rx(s) sent to pharmacy electronically.  

## 2015-05-04 ENCOUNTER — Other Ambulatory Visit: Payer: Self-pay | Admitting: General Surgery

## 2015-05-04 DIAGNOSIS — K432 Incisional hernia without obstruction or gangrene: Secondary | ICD-10-CM | POA: Insufficient documentation

## 2015-05-04 HISTORY — DX: Incisional hernia without obstruction or gangrene: K43.2

## 2015-05-05 ENCOUNTER — Telehealth: Payer: Self-pay | Admitting: Cardiovascular Disease

## 2015-05-08 ENCOUNTER — Ambulatory Visit
Admission: RE | Admit: 2015-05-08 | Discharge: 2015-05-08 | Disposition: A | Discharge status: Home / Self Care | Payer: BLUE CROSS/BLUE SHIELD | Source: Ambulatory Visit | Attending: General Surgery | Admitting: General Surgery

## 2015-05-08 DIAGNOSIS — K432 Incisional hernia without obstruction or gangrene: Secondary | ICD-10-CM

## 2015-05-08 MED ORDER — IOPAMIDOL (ISOVUE-300) INJECTION 61%
100.0000 mL | Freq: Once | INTRAVENOUS | Status: AC | PRN
  Administered 2015-05-08: 100 mL via INTRAVENOUS

## 2015-05-09 ENCOUNTER — Encounter: Payer: Self-pay | Admitting: Cardiovascular Disease

## 2015-05-17 ENCOUNTER — Other Ambulatory Visit: Payer: Self-pay | Admitting: General Surgery

## 2015-05-24 ENCOUNTER — Encounter (HOSPITAL_COMMUNITY): Payer: Self-pay | Admitting: *Deleted

## 2015-05-24 MED ORDER — HEPARIN SODIUM (PORCINE) 5000 UNIT/ML IJ SOLN
5000.0000 [IU] | Freq: Once | INTRAMUSCULAR | Status: AC
  Administered 2015-05-25: 5000 [IU] via SUBCUTANEOUS
  Filled 2015-05-24: qty 1

## 2015-05-24 MED ORDER — CEFAZOLIN SODIUM-DEXTROSE 2-3 GM-% IV SOLR
2.0000 g | INTRAVENOUS | Status: AC
  Administered 2015-05-25: 2 g via INTRAVENOUS
  Filled 2015-05-24: qty 50

## 2015-05-24 NOTE — Progress Notes (Signed)
Anesthesia Chart Review:  Pt is a 65 year old female scheduled for laparoscopic incisional hernia repair with mesh on 05/25/2015 with Dr. Donne Hazel.   Pt is a same day work up.   Cardiologist is Dr. Dani Gobble Croitoru who has cleared pt for surgery (see letters tab).   PMH includes:  CAD (s/p CABG 09/28/12: LIMA-LAD, SVG-OM1-OM2, SVG-intermediate), MI (2004) ischemic cardiomyopathy, pacemaker, LBBB, PAF, HTN, DM, hyperlipidemia. Former smoker. MBI 26.   Medications include: ASA, carvedilol, dulaglutide, lasix, glipizide, invokana, losartan, metformin, xarelto, crestor. Pt was to have stopped xarelto 3 days prior to surgery.   Labs will be obtained DOS. HgbA1c was 7.7 on 04/10/15.   EKG 12/29/14: atrial sensed ventricular paced rhythm  Cardiopulmonary exercise test 10/26/14: Conclusion: Exercise testing with gas exchange demonstrates a mild to moderate functional impairment when compared to matched sedentary norms. Some improvement in VO2 max with IBW correction suggests her weight could be playing a role in dyspnea. However, significant finding is the severe chronotropic incompetence despite adequate effort. Consider medications or medical causes - clinical correlation recommended  Echo 05/09/14:  - Left ventricle: The cavity size was normal. Wall thickness was normal. Systolic function was normal. The estimated ejection fraction was in the range of 50% to 55%. Doppler parameters areconsistent with abnormal left ventricular relaxation (grade 1 diastolic dysfunction).  If labs acceptable DOS, I anticipate pt can proceed as scheduled.   Willeen Cass, FNP-BC [facility]/Anesthesiology Phone: [phone] 05/24/2015 12:33 PM

## 2015-05-24 NOTE — Progress Notes (Signed)
Pt does have CAD, has had a CABG, hx of atrial fib and has a pacemaker. Pt's cardiologist is Dr. Sallyanne Kuster and last office visit was 01/04/16. Pt was instructed to stop Xarelto, last dose was 05/21/15. Pt is diabetic, last A1C was 7.7 on 04/10/15. Pt states fasting blood sugar usually runs between 80-125. Pt instructed not to take any of her oral diabetic medications in the AM.

## 2015-05-25 ENCOUNTER — Inpatient Hospital Stay (HOSPITAL_COMMUNITY): Payer: BLUE CROSS/BLUE SHIELD | Admitting: Emergency Medicine

## 2015-05-25 ENCOUNTER — Inpatient Hospital Stay (HOSPITAL_COMMUNITY)
Admission: RE | Admit: 2015-05-25 | Discharge: 2015-05-27 | DRG: 337 | Disposition: A | Discharge status: Home / Self Care | Payer: BLUE CROSS/BLUE SHIELD | Source: Ambulatory Visit | Attending: General Surgery | Admitting: General Surgery

## 2015-05-25 ENCOUNTER — Encounter (HOSPITAL_COMMUNITY): Payer: Self-pay | Admitting: Anesthesiology

## 2015-05-25 ENCOUNTER — Encounter (HOSPITAL_COMMUNITY)
Admission: RE | Disposition: A | Discharge status: Home / Self Care | Payer: Self-pay | Source: Ambulatory Visit | Attending: General Surgery

## 2015-05-25 DIAGNOSIS — Z9013 Acquired absence of bilateral breasts and nipples: Secondary | ICD-10-CM | POA: Diagnosis not present

## 2015-05-25 DIAGNOSIS — Z7984 Long term (current) use of oral hypoglycemic drugs: Secondary | ICD-10-CM | POA: Diagnosis not present

## 2015-05-25 DIAGNOSIS — I110 Hypertensive heart disease with heart failure: Secondary | ICD-10-CM | POA: Diagnosis present

## 2015-05-25 DIAGNOSIS — I2510 Atherosclerotic heart disease of native coronary artery without angina pectoris: Secondary | ICD-10-CM | POA: Diagnosis present

## 2015-05-25 DIAGNOSIS — K660 Peritoneal adhesions (postprocedural) (postinfection): Secondary | ICD-10-CM | POA: Diagnosis present

## 2015-05-25 DIAGNOSIS — E119 Type 2 diabetes mellitus without complications: Secondary | ICD-10-CM | POA: Diagnosis present

## 2015-05-25 DIAGNOSIS — Z87891 Personal history of nicotine dependence: Secondary | ICD-10-CM

## 2015-05-25 DIAGNOSIS — Z950 Presence of cardiac pacemaker: Secondary | ICD-10-CM

## 2015-05-25 DIAGNOSIS — K432 Incisional hernia without obstruction or gangrene: Secondary | ICD-10-CM | POA: Diagnosis present

## 2015-05-25 DIAGNOSIS — Z7901 Long term (current) use of anticoagulants: Secondary | ICD-10-CM

## 2015-05-25 DIAGNOSIS — Z7982 Long term (current) use of aspirin: Secondary | ICD-10-CM

## 2015-05-25 DIAGNOSIS — Z853 Personal history of malignant neoplasm of breast: Secondary | ICD-10-CM | POA: Diagnosis not present

## 2015-05-25 DIAGNOSIS — I4891 Unspecified atrial fibrillation: Secondary | ICD-10-CM | POA: Diagnosis present

## 2015-05-25 DIAGNOSIS — I509 Heart failure, unspecified: Secondary | ICD-10-CM | POA: Diagnosis present

## 2015-05-25 DIAGNOSIS — Z951 Presence of aortocoronary bypass graft: Secondary | ICD-10-CM

## 2015-05-25 DIAGNOSIS — E785 Hyperlipidemia, unspecified: Secondary | ICD-10-CM | POA: Diagnosis present

## 2015-05-25 DIAGNOSIS — I252 Old myocardial infarction: Secondary | ICD-10-CM

## 2015-05-25 HISTORY — DX: Presence of automatic (implantable) cardiac defibrillator: Z95.810

## 2015-05-25 HISTORY — PX: LAPAROSCOPIC INCISIONAL / UMBILICAL / VENTRAL HERNIA REPAIR: SUR789

## 2015-05-25 HISTORY — DX: Malignant neoplasm of unspecified site of left female breast: C50.912

## 2015-05-25 HISTORY — PX: INCISIONAL HERNIA REPAIR: SHX193

## 2015-05-25 HISTORY — PX: INSERTION OF MESH: SHX5868

## 2015-05-25 HISTORY — DX: Shortness of breath: R06.02

## 2015-05-25 LAB — PROTIME-INR
INR: 1.09 (ref 0.00–1.49)
Prothrombin Time: 14.3 seconds (ref 11.6–15.2)

## 2015-05-25 LAB — BASIC METABOLIC PANEL
Anion gap: 12 (ref 5–15)
BUN: 16 mg/dL (ref 6–20)
CHLORIDE: 103 mmol/L (ref 101–111)
CO2: 24 mmol/L (ref 22–32)
CREATININE: 0.7 mg/dL (ref 0.44–1.00)
Calcium: 10.1 mg/dL (ref 8.9–10.3)
GFR calc Af Amer: >60 mL/min (ref >60)
GFR calc non Af Amer: >60 mL/min (ref >60)
Glucose, Bld: 173 mg/dL — ABNORMAL HIGH (ref 65–99)
Potassium: 4 mmol/L (ref 3.5–5.1)
Sodium: 139 mmol/L (ref 135–145)

## 2015-05-25 LAB — CBC WITH DIFFERENTIAL/PLATELET
Basophils Absolute: 0 10*3/uL (ref 0.0–0.1)
Basophils Relative: 0 %
EOS ABS: 0.1 10*3/uL (ref 0.0–0.7)
EOS PCT: 2 %
HEMATOCRIT: 40.2 % (ref 36.0–46.0)
Hemoglobin: 13.4 g/dL (ref 12.0–15.0)
LYMPHS ABS: 1.7 10*3/uL (ref 0.7–4.0)
Lymphocytes Relative: 24 %
MCH: 30.2 pg (ref 26.0–34.0)
MCHC: 33.3 g/dL (ref 30.0–36.0)
MCV: 90.5 fL (ref 78.0–100.0)
MONOS PCT: 4 %
Monocytes Absolute: 0.3 10*3/uL (ref 0.1–1.0)
NEUTROS PCT: 70 %
Neutro Abs: 4.9 10*3/uL (ref 1.7–7.7)
PLATELETS: 234 10*3/uL (ref 150–400)
RBC: 4.44 MIL/uL (ref 3.87–5.11)
RDW: 13 % (ref 11.5–15.5)
WBC: 7 10*3/uL (ref 4.0–10.5)

## 2015-05-25 LAB — GLUCOSE, CAPILLARY
GLUCOSE-CAPILLARY: 192 mg/dL — AB (ref 65–99)
Glucose-Capillary: 149 mg/dL — ABNORMAL HIGH (ref 65–99)
Glucose-Capillary: 168 mg/dL — ABNORMAL HIGH (ref 65–99)
Glucose-Capillary: 127 mg/dL — ABNORMAL HIGH (ref 65–99)

## 2015-05-25 SURGERY — REPAIR, HERNIA, INCISIONAL, LAPAROSCOPIC
Anesthesia: General | Site: Abdomen

## 2015-05-25 MED ORDER — BUPIVACAINE-EPINEPHRINE (PF) 0.25% -1:200000 IJ SOLN
INTRAMUSCULAR | Status: AC
  Filled 2015-05-25: qty 30

## 2015-05-25 MED ORDER — LIDOCAINE HCL (CARDIAC) 20 MG/ML IV SOLN
INTRAVENOUS | Status: DC | PRN
  Administered 2015-05-25: 20 mg via INTRAVENOUS

## 2015-05-25 MED ORDER — BUPIVACAINE-EPINEPHRINE 0.25% -1:200000 IJ SOLN
INTRAMUSCULAR | Status: DC | PRN
  Administered 2015-05-25: 30 mL

## 2015-05-25 MED ORDER — MORPHINE SULFATE (PF) 2 MG/ML IV SOLN: 2.0000 mg | INTRAVENOUS | Status: DC | PRN

## 2015-05-25 MED ORDER — OXYCODONE HCL 5 MG PO TABS
5.0000 mg | ORAL_TABLET | ORAL | Status: DC | PRN
  Administered 2015-05-25 – 2015-05-27 (×4): 10 mg via ORAL
  Filled 2015-05-25 (×4): qty 2

## 2015-05-25 MED ORDER — LOSARTAN POTASSIUM 50 MG PO TABS
25.0000 mg | ORAL_TABLET | Freq: Two times a day (BID) | ORAL | Status: DC
  Administered 2015-05-26 (×2): 25 mg via ORAL
  Filled 2015-05-25 (×2): qty 1

## 2015-05-25 MED ORDER — SODIUM CHLORIDE 0.9 % IJ SOLN
INTRAMUSCULAR | Status: AC
  Filled 2015-05-25: qty 10

## 2015-05-25 MED ORDER — SUGAMMADEX SODIUM 200 MG/2ML IV SOLN
INTRAVENOUS | Status: AC
  Filled 2015-05-25: qty 2

## 2015-05-25 MED ORDER — ONDANSETRON 4 MG PO TBDP: 4.0000 mg | ORAL_TABLET | Freq: Four times a day (QID) | ORAL | Status: DC | PRN

## 2015-05-25 MED ORDER — SODIUM CHLORIDE 0.9 % IV SOLN
INTRAVENOUS | Status: DC
  Administered 2015-05-25 – 2015-05-26 (×2): via INTRAVENOUS

## 2015-05-25 MED ORDER — EPHEDRINE SULFATE 50 MG/ML IJ SOLN
INTRAMUSCULAR | Status: DC | PRN
  Administered 2015-05-25 (×3): 10 mg via INTRAVENOUS

## 2015-05-25 MED ORDER — PROMETHAZINE HCL 25 MG/ML IJ SOLN: 6.2500 mg | INTRAMUSCULAR | Status: DC | PRN

## 2015-05-25 MED ORDER — FUROSEMIDE 40 MG PO TABS
40.0000 mg | ORAL_TABLET | Freq: Every day | ORAL | Status: DC
  Filled 2015-05-25: qty 1

## 2015-05-25 MED ORDER — EPHEDRINE SULFATE 50 MG/ML IJ SOLN
INTRAMUSCULAR | Status: AC
  Filled 2015-05-25: qty 1

## 2015-05-25 MED ORDER — MIDAZOLAM HCL 2 MG/2ML IJ SOLN
INTRAMUSCULAR | Status: AC
  Filled 2015-05-25: qty 2

## 2015-05-25 MED ORDER — ASPIRIN EC 81 MG PO TBEC
81.0000 mg | DELAYED_RELEASE_TABLET | Freq: Every day | ORAL | Status: DC
  Administered 2015-05-26: 81 mg via ORAL
  Filled 2015-05-25: qty 1

## 2015-05-25 MED ORDER — INSULIN ASPART 100 UNIT/ML ~~LOC~~ SOLN: 0.0000 [IU] | Freq: Every day | SUBCUTANEOUS | Status: DC

## 2015-05-25 MED ORDER — 0.9 % SODIUM CHLORIDE (POUR BTL) OPTIME
TOPICAL | Status: DC | PRN
  Administered 2015-05-25: 1000 mL

## 2015-05-25 MED ORDER — METHOCARBAMOL 500 MG PO TABS: 500.0000 mg | ORAL_TABLET | Freq: Four times a day (QID) | ORAL | Status: DC | PRN

## 2015-05-25 MED ORDER — MIDAZOLAM HCL 2 MG/2ML IJ SOLN: 0.5000 mg | Freq: Once | INTRAMUSCULAR | Status: DC | PRN

## 2015-05-25 MED ORDER — HYDROMORPHONE HCL 1 MG/ML IJ SOLN
INTRAMUSCULAR | Status: AC
  Filled 2015-05-25: qty 1

## 2015-05-25 MED ORDER — HYDROMORPHONE HCL 1 MG/ML IJ SOLN
0.2500 mg | INTRAMUSCULAR | Status: DC | PRN
  Administered 2015-05-25: 0.5 mg via INTRAVENOUS

## 2015-05-25 MED ORDER — ETOMIDATE 2 MG/ML IV SOLN
INTRAVENOUS | Status: AC
  Filled 2015-05-25: qty 10

## 2015-05-25 MED ORDER — FENTANYL CITRATE (PF) 100 MCG/2ML IJ SOLN
INTRAMUSCULAR | Status: DC | PRN
  Administered 2015-05-25: 100 ug via INTRAVENOUS

## 2015-05-25 MED ORDER — ONDANSETRON HCL 4 MG/2ML IJ SOLN
INTRAMUSCULAR | Status: DC | PRN
  Administered 2015-05-25: 4 mg via INTRAVENOUS

## 2015-05-25 MED ORDER — PROPOFOL 10 MG/ML IV BOLUS
INTRAVENOUS | Status: DC | PRN
  Administered 2015-05-25: 70 mg via INTRAVENOUS

## 2015-05-25 MED ORDER — FENTANYL CITRATE (PF) 250 MCG/5ML IJ SOLN
INTRAMUSCULAR | Status: AC
  Filled 2015-05-25: qty 5

## 2015-05-25 MED ORDER — LIDOCAINE HCL (CARDIAC) 20 MG/ML IV SOLN
INTRAVENOUS | Status: AC
  Filled 2015-05-25: qty 5

## 2015-05-25 MED ORDER — PROPOFOL 10 MG/ML IV BOLUS
INTRAVENOUS | Status: AC
  Filled 2015-05-25: qty 20

## 2015-05-25 MED ORDER — ENOXAPARIN SODIUM 40 MG/0.4ML ~~LOC~~ SOLN
40.0000 mg | SUBCUTANEOUS | Status: DC
  Administered 2015-05-26: 40 mg via SUBCUTANEOUS
  Filled 2015-05-25: qty 0.4

## 2015-05-25 MED ORDER — SUGAMMADEX SODIUM 200 MG/2ML IV SOLN
INTRAVENOUS | Status: DC | PRN
  Administered 2015-05-25: 125 mg via INTRAVENOUS

## 2015-05-25 MED ORDER — LACTATED RINGERS IV SOLN
INTRAVENOUS | Status: DC | PRN
  Administered 2015-05-25: 12:00:00 via INTRAVENOUS

## 2015-05-25 MED ORDER — ROCURONIUM BROMIDE 50 MG/5ML IV SOLN
INTRAVENOUS | Status: AC
  Filled 2015-05-25: qty 2

## 2015-05-25 MED ORDER — ACETAMINOPHEN 500 MG PO TABS
1000.0000 mg | ORAL_TABLET | Freq: Four times a day (QID) | ORAL | Status: AC
  Administered 2015-05-25 – 2015-05-26 (×2): 1000 mg via ORAL
  Filled 2015-05-25 (×3): qty 2

## 2015-05-25 MED ORDER — PHENYLEPHRINE HCL 10 MG/ML IJ SOLN
INTRAMUSCULAR | Status: DC | PRN
  Administered 2015-05-25: 120 ug via INTRAVENOUS
  Administered 2015-05-25: 80 ug via INTRAVENOUS
  Administered 2015-05-25: 120 ug via INTRAVENOUS

## 2015-05-25 MED ORDER — GLYCOPYRROLATE 0.2 MG/ML IJ SOLN
INTRAMUSCULAR | Status: AC
  Filled 2015-05-25: qty 1

## 2015-05-25 MED ORDER — CARVEDILOL 12.5 MG PO TABS
12.5000 mg | ORAL_TABLET | Freq: Two times a day (BID) | ORAL | Status: DC
  Administered 2015-05-25 – 2015-05-26 (×3): 12.5 mg via ORAL
  Filled 2015-05-25 (×3): qty 1

## 2015-05-25 MED ORDER — PHENYLEPHRINE 40 MCG/ML (10ML) SYRINGE FOR IV PUSH (FOR BLOOD PRESSURE SUPPORT)
PREFILLED_SYRINGE | INTRAVENOUS | Status: AC
  Filled 2015-05-25: qty 10

## 2015-05-25 MED ORDER — ONDANSETRON HCL 4 MG/2ML IJ SOLN
INTRAMUSCULAR | Status: AC
  Filled 2015-05-25: qty 2

## 2015-05-25 MED ORDER — ARTIFICIAL TEARS OP OINT
TOPICAL_OINTMENT | OPHTHALMIC | Status: AC
  Filled 2015-05-25: qty 3.5

## 2015-05-25 MED ORDER — DOCUSATE SODIUM 100 MG PO CAPS
100.0000 mg | ORAL_CAPSULE | Freq: Two times a day (BID) | ORAL | Status: DC
  Administered 2015-05-25 – 2015-05-26 (×3): 100 mg via ORAL
  Filled 2015-05-25 (×3): qty 1

## 2015-05-25 MED ORDER — ROCURONIUM BROMIDE 100 MG/10ML IV SOLN
INTRAVENOUS | Status: DC | PRN
  Administered 2015-05-25: 10 mg via INTRAVENOUS
  Administered 2015-05-25: 40 mg via INTRAVENOUS

## 2015-05-25 MED ORDER — NITROGLYCERIN 0.4 MG SL SUBL: 0.4000 mg | SUBLINGUAL_TABLET | SUBLINGUAL | Status: DC | PRN

## 2015-05-25 MED ORDER — ONDANSETRON HCL 4 MG/2ML IJ SOLN
4.0000 mg | Freq: Four times a day (QID) | INTRAMUSCULAR | Status: DC | PRN
  Administered 2015-05-26: 4 mg via INTRAVENOUS
  Filled 2015-05-25: qty 2

## 2015-05-25 MED ORDER — INSULIN ASPART 100 UNIT/ML ~~LOC~~ SOLN
0.0000 [IU] | Freq: Three times a day (TID) | SUBCUTANEOUS | Status: DC
  Administered 2015-05-25 – 2015-05-27 (×5): 3 [IU] via SUBCUTANEOUS

## 2015-05-25 MED ORDER — MEPERIDINE HCL 25 MG/ML IJ SOLN: 6.2500 mg | INTRAMUSCULAR | Status: DC | PRN

## 2015-05-25 SURGICAL SUPPLY — 54 items
APPLIER CLIP LOGIC TI 5 (MISCELLANEOUS) IMPLANT
APPLIER CLIP ROT 10 11.4 M/L (STAPLE)
BLADE CLIPPER SURG (BLADE) ×2 IMPLANT
BNDG GAUZE ELAST 4 BULKY (GAUZE/BANDAGES/DRESSINGS) IMPLANT
CANISTER SUCTION 2500CC (MISCELLANEOUS) IMPLANT
CHLORAPREP W/TINT 26ML (MISCELLANEOUS) ×2 IMPLANT
CLIP APPLIE ROT 10 11.4 M/L (STAPLE) IMPLANT
COVER SURGICAL LIGHT HANDLE (MISCELLANEOUS) ×2 IMPLANT
DEVICE RELIATACK FIXATION (MISCELLANEOUS) ×2 IMPLANT
DEVICE TROCAR PUNCTURE CLOSURE (ENDOMECHANICALS) ×2 IMPLANT
DRAPE INCISE IOBAN 66X45 STRL (DRAPES) ×2 IMPLANT
DRAPE LAPAROSCOPIC ABDOMINAL (DRAPES) ×2 IMPLANT
ELECT REM PT RETURN 9FT ADLT (ELECTROSURGICAL) ×2
ELECTRODE REM PT RTRN 9FT ADLT (ELECTROSURGICAL) ×1 IMPLANT
GLOVE BIO SURGEON STRL SZ7 (GLOVE) ×4 IMPLANT
GLOVE BIOGEL PI IND STRL 6.5 (GLOVE) ×1 IMPLANT
GLOVE BIOGEL PI IND STRL 7.0 (GLOVE) ×2 IMPLANT
GLOVE BIOGEL PI IND STRL 7.5 (GLOVE) ×1 IMPLANT
GLOVE BIOGEL PI IND STRL 8 (GLOVE) ×1 IMPLANT
GLOVE BIOGEL PI INDICATOR 6.5 (GLOVE) ×1
GLOVE BIOGEL PI INDICATOR 7.0 (GLOVE) ×2
GLOVE BIOGEL PI INDICATOR 7.5 (GLOVE) ×1
GLOVE BIOGEL PI INDICATOR 8 (GLOVE) ×1
GLOVE SURG SS PI 6.5 STRL IVOR (GLOVE) ×2 IMPLANT
GLOVE SURG SS PI 7.0 STRL IVOR (GLOVE) ×2 IMPLANT
GLOVE SURG SS PI 8.0 STRL IVOR (GLOVE) ×4 IMPLANT
GOWN STRL REUS W/ TWL LRG LVL3 (GOWN DISPOSABLE) ×3 IMPLANT
GOWN STRL REUS W/TWL LRG LVL3 (GOWN DISPOSABLE) ×3
KIT BASIN OR (CUSTOM PROCEDURE TRAY) ×2 IMPLANT
KIT ROOM TURNOVER OR (KITS) ×2 IMPLANT
LIQUID BAND (GAUZE/BANDAGES/DRESSINGS) ×2 IMPLANT
MARKER SKIN DUAL TIP RULER LAB (MISCELLANEOUS) ×2 IMPLANT
MESH VENTRALIGHT ST 6IN CRC (Mesh General) ×2 IMPLANT
NEEDLE SPNL 22GX3.5 QUINCKE BK (NEEDLE) ×2 IMPLANT
NS IRRIG 1000ML POUR BTL (IV SOLUTION) ×2 IMPLANT
PAD ARMBOARD 7.5X6 YLW CONV (MISCELLANEOUS) ×4 IMPLANT
RELOAD RELIATACK 10 (MISCELLANEOUS) ×4 IMPLANT
RELOAD RELIATACK 5 (MISCELLANEOUS) IMPLANT
SCALPEL HARMONIC ACE (MISCELLANEOUS) IMPLANT
SCISSORS LAP 5X35 DISP (ENDOMECHANICALS) ×2 IMPLANT
SET IRRIG TUBING LAPAROSCOPIC (IRRIGATION / IRRIGATOR) IMPLANT
SLEEVE ENDOPATH XCEL 5M (ENDOMECHANICALS) ×4 IMPLANT
STRIP CLOSURE SKIN 1/2X4 (GAUZE/BANDAGES/DRESSINGS) ×2 IMPLANT
SUT MNCRL AB 4-0 PS2 18 (SUTURE) ×2 IMPLANT
SUT PROLENE 0 CT 1 CR/8 (SUTURE) ×4 IMPLANT
SUT VICRYL 0 UR6 27IN ABS (SUTURE) ×2 IMPLANT
TOWEL OR 17X24 6PK STRL BLUE (TOWEL DISPOSABLE) ×2 IMPLANT
TOWEL OR 17X26 10 PK STRL BLUE (TOWEL DISPOSABLE) ×2 IMPLANT
TRAY FOLEY CATH 14FR (SET/KITS/TRAYS/PACK) IMPLANT
TRAY LAPAROSCOPIC MC (CUSTOM PROCEDURE TRAY) ×2 IMPLANT
TROCAR XCEL BLUNT TIP 100MML (ENDOMECHANICALS) IMPLANT
TROCAR XCEL NON-BLD 11X100MML (ENDOMECHANICALS) ×2 IMPLANT
TROCAR XCEL NON-BLD 5MMX100MML (ENDOMECHANICALS) ×2 IMPLANT
TUBING INSUFFLATION (TUBING) ×2 IMPLANT

## 2015-05-25 NOTE — H&P (Signed)
65 yof who has multiple medical issues presents with enlarging right lower quadrant hernia. she has history of what appears to be sbo in 2015 and has ct scan at that time. she is former smoker. she has history of CAB in 2014 and has done well. she also has breast cancer history treated with chemo/ bilateral mastectomies with left ax nodes removed (not sure what this was) that had bilateral trams that failed and now has implants. she is having bms. has some discomfort there at times.   Other Problems Elbert Ewings, CMA; 05/04/2015 2:53 PM) Atrial Fibrillation Breast Cancer Chest pain Cholelithiasis Congestive Heart Failure Diabetes Mellitus High blood pressure Hypercholesterolemia Myocardial infarction  Past Surgical History Elbert Ewings, CMA; 05/04/2015 2:53 PM) Breast Biopsy Bilateral. Coronary Artery Bypass Graft Gallbladder Surgery - Open Hysterectomy (due to cancer) - Complete Mastectomy Bilateral.  Diagnostic Studies History Elbert Ewings, CMA; 05/04/2015 2:53 PM) Colonoscopy 1-5 years ago Mammogram 1-3 years ago Pap Smear 1-5 years ago  Allergies Elbert Ewings, CMA; 05/04/2015 2:53 PM) No Known Drug Allergies02/23/2017  Medication History Elbert Ewings, CMA; 05/04/2015 2:54 PM) Carvedilol (25MG  Tablet, Oral) Active. Furosemide (40MG  Tablet, Oral) Active. GlipiZIDE (5MG  Tablet, Oral) Active. Invokana (100MG  Tablet, Oral) Active. Losartan Potassium (25MG  Tablet, Oral) Active. MetFORMIN HCl (1000MG  Tablet, Oral) Active. Xarelto (20MG  Tablet, Oral) Active. Crestor (10MG  Tablet, Oral) Active. Aspirin (81MG  Tablet, Oral) Active. Medications Reconciled  Social History Elbert Ewings, [place]; 05/04/2015 2:53 PM) Alcohol use Occasional alcohol use. Caffeine use Coffee. No drug use Tobacco use Former smoker.  Family History Elbert Ewings, [place]; 05/04/2015 2:53 PM) Breast Cancer Sister. Colon Cancer Sister. Colon Polyps Sister. Depression  Sister. Diabetes Mellitus Brother. Heart Disease Mother, Sister. Heart disease in female family member before age 62 Kidney Disease Mother.  Pregnancy / Birth History Elbert Ewings, [place]; 05/04/2015 2:53 PM) Age at menarche 65 years. Age of menopause 51-55 Contraceptive History Intrauterine device, Oral contraceptives. Gravida 4 Irregular periods Maternal age 72-20 Para 3  Review of Systems Elbert Ewings CMA; 05/04/2015 2:53 PM) General Not Present- Appetite Loss, Chills, Fatigue, Fever, Night Sweats, Weight Gain and Weight Loss. Skin Not Present- Change in Wart/Mole, Dryness, Hives, Jaundice, New Lesions, Non-Healing Wounds, Rash and Ulcer. HEENT Present- Wears glasses/contact lenses. Not Present- Earache, Hearing Loss, Hoarseness, Nose Bleed, Oral Ulcers, Ringing in the Ears, Seasonal Allergies, Sinus Pain, Sore Throat, Visual Disturbances and Yellow Eyes. Respiratory Not Present- Bloody sputum, Chronic Cough, Difficulty Breathing, Snoring and Wheezing. Breast Not Present- Breast Mass, Breast Pain, Nipple Discharge and Skin Changes. Cardiovascular Not Present- Chest Pain, Difficulty Breathing Lying Down, Leg Cramps, Palpitations, Rapid Heart Rate, Shortness of Breath and Swelling of Extremities. Gastrointestinal Present- Abdominal Pain, Bloating and Constipation. Not Present- Bloody Stool, Change in Bowel Habits, Chronic diarrhea, Difficulty Swallowing, Excessive gas, Gets full quickly at meals, Hemorrhoids, Indigestion, Nausea, Rectal Pain and Vomiting. Female Genitourinary Not Present- Frequency, Nocturia, Painful Urination, Pelvic Pain and Urgency. Musculoskeletal Not Present- Back Pain, Joint Pain, Joint Stiffness, Muscle Pain, Muscle Weakness and Swelling of Extremities. Neurological Not Present- Decreased Memory, Fainting, Headaches, Numbness, Seizures, Tingling, Tremor, Trouble walking and Weakness. Psychiatric Not Present- Anxiety, Bipolar, Change in Sleep Pattern,  Depression, Fearful and Frequent crying. Endocrine Not Present- Cold Intolerance, Excessive Hunger, Hair Changes, Heat Intolerance, Hot flashes and New Diabetes. Hematology Present- Easy Bruising. Not Present- Excessive bleeding, Gland problems, HIV and Persistent Infections.  Vitals Elbert Ewings CMA; 05/04/2015 2:54 PM) 05/04/2015 2:54 PM Weight: 152 lb Height: 64.5in Body Surface Area: 1.75 m Body Mass  Index: 25.69 kg/m  Temp.: 97.54F(Temporal)  Pulse: 82 (Regular)  BP: 130/68 (Sitting, Left Arm, Standard)  Physical Exam [name] MD; 05/04/2015 3:19 PM) General Mental Status-Alert. Orientation-Oriented X3.  Abdomen rlq hernia mildy tender healed low transverse scar cv rrr pulm cta bilaterally  Assessment & Plan [name] MD; 05/04/2015 3:20 PM) INCISIONAL HERNIA (K43.2)  Laparoscopic incisional hernia repair with mesh Recommended surgery due to symptoms and prior sbo. will plan lap repair with mesh. risks including open repair, bowel injury, bleeding, infection, recurrence as well as cardiac risks discussed. will proceed soon due to symptoms

## 2015-05-25 NOTE — Transfer of Care (Signed)
Immediate Anesthesia Transfer of Care Note  Patient: [name]  Procedure(s) Performed: Procedure(s): LAPAROSCOPIC INCISIONAL HERNIA WITH MESH  (N/A) INSERTION OF MESH (N/A)  Patient Location: PACU  Anesthesia Type:General  Level of Consciousness: awake, alert , oriented and sedated  Airway & Oxygen Therapy: Patient Spontanous Breathing and Patient connected to nasal cannula oxygen  Post-op Assessment: Report given to RN, Post -op Vital signs reviewed and stable and Patient moving all extremities  Post vital signs: Reviewed and stable  Last Vitals:  Filed Vitals:   05/25/15 1041  BP: 112/55  Pulse: 86  Temp: [id number] C    Complications: No apparent anesthesia complications

## 2015-05-25 NOTE — Op Note (Signed)
Preoperative diagnosis: incisional hernia Postoperative diagnosis: saa Procedure: laparoscopic lysis of adhesions times 30 minutes, laparoscopic incisional hernia repair of 2 cm defect with 15 cm ventralight mesh  Surgeon: Dr Serita Grammes Anesthesia: general EBL: minimal Drains none Specimen none Complications: none Sponge count correct at completion Disposition to recovery stable  Indications: This is a  47 yof with history of bilateral tram flap and cab who presents after having an sbo requiring admission from an incarcerated incisional hernia in the right lower quadrant.  She still has some symptoms associated with this. Her entire abdominal wall is very lax after surgery but there is a discrete defect on exam and on ct scan.  I discussed a laparoscopic repair of this defect to prevent further obstruction.  I do not think given her comorbidities anything more is indicated.  She is agreeable.  Procedure: After informed consent was obtained the patient was taken to the operating room. She was given antibiotics. Sequential compression devices were on her legs. She was placed under general anesthesia without complication. Her abdomen was prepped and draped in the standard sterile surgical fashion. A surgical timeout was then performed.  I infiltrated marcaine in the left upper quadrant. I then made an incision with an 11 blade. I inserted a 5 mm optiview trocar with no evidence of entry injury.  I insufflated the abdomen to 15 mm Hg pressure. I then inserted two further five mm trocars in the left lateral abdomen.  She had a lot of omental adhesions to the abdominal wall that were taken down with a combination of blunt and sharp dissection. There was no injury noted.  This took about thirty minutes.  I then noted there was previous prolene mesh placed after her tram flap.  Most of omentum was stuck to this.  It appeared her entire abdominal wall was weak but there was a discrete 2 cm hernia defect  in the right lower quadrant.  I then placed 4 cardinal sutures of 0 prolene in a 15 cm circular ventralight mesh.  I then inserted the mesh through a trocar site.  I placed the mesh flat centering the defect.  I then brought up my cardinal sutures with the endoclose device.  I used the reliatack to secure this as well. I put in several more prolene sutures to secure this also. The mesh laid flat with good coverage.  Hemostasis was observed.  I then closed the mesh insertion site with a 0 vicryl and the endoclose.  I removed the trocars. I closed all the incisions with 4-0 monocryl and glue.  She tolerated this well, was extubated and transferred to recovery stable.

## 2015-05-25 NOTE — Anesthesia Procedure Notes (Signed)
Procedure Name: Intubation Date/Time: 05/25/2015 12:31 PM Performed by: Trixie Deis A Pre-anesthesia Checklist: Patient identified, Timeout performed, Emergency Drugs available, Suction available and Patient being monitored Patient Re-evaluated:Patient Re-evaluated prior to inductionOxygen Delivery Method: Circle system utilized Preoxygenation: Pre-oxygenation with 100% oxygen Intubation Type: IV induction Ventilation: Mask ventilation without difficulty Laryngoscope Size: Mac and 3 Grade View: Grade II Tube type: Oral Tube size: 7.5 mm Number of attempts: 1 Airway Equipment and Method: Stylet Placement Confirmation: ETT inserted through vocal cords under direct vision,  breath sounds checked- equal and bilateral and positive ETCO2 Secured at: 21 cm Tube secured with: Tape Dental Injury: Teeth and Oropharynx as per pre-operative assessment and Bloody posterior oropharynx

## 2015-05-25 NOTE — Care Management Note (Addendum)
Case Management Note  Patient Details  Name: [name] MRN: [id number] Date of Birth: September 09, 1950  Subjective/Objective:                    Action/Plan:  Initial  UR completed  Expected Discharge Date:                  Expected Discharge Plan:  Home/Self Care  In-House Referral:     Discharge planning Services     Post Acute Care Choice:    Choice offered to:     DME Arranged:    DME Agency:     HH Arranged:    [place] Agency:     Status of Service:  In process, will continue to follow  Medicare Important Message Given:    Date Medicare IM Given:    Medicare IM give by:    Date Additional Medicare IM Given:    Additional Medicare Important Message give by:     If discussed at [place] of Stay Meetings, dates discussed:    Additional Comments:  Marilu Favre, RN 05/25/2015, 3:43 PM

## 2015-05-25 NOTE — Anesthesia Preprocedure Evaluation (Addendum)
Anesthesia Evaluation  Patient identified by MRN, date of birth, ID band Patient awake    Reviewed: Allergy & Precautions, NPO status , Patient's Chart, lab work & pertinent test results, reviewed documented beta blocker date and time   History of Anesthesia Complications Negative for: history of anesthetic complications  Airway Mallampati: II  TM Distance: >3 FB Neck ROM: Full    Dental  (+) Dental Advisory Given, Caps, Missing   Pulmonary COPD, former smoker,    breath sounds clear to auscultation       Cardiovascular hypertension, Pt. on medications and Pt. on home beta blockers (-) angina+ CAD, + Past MI and + CABG  + dysrhythmias Atrial Fibrillation + pacemaker ([employer] BiV pacer)  Rhythm:Regular Rate:Normal  '15 ECHO: EF 50-55%, valves OK '14 Echo: EF 35%, Sev inferoseptal HK   Neuro/Psych negative neurological ROS     GI/Hepatic negative GI ROS, Neg liver ROS,   Endo/Other  diabetes (glu 149), Oral Hypoglycemic Agents  Renal/GU negative Renal ROS     Musculoskeletal   Abdominal   Peds  Hematology  (+) Blood dyscrasia (Xarelto), ,   Anesthesia Other Findings Breast cancer  Reproductive/Obstetrics                           Anesthesia Physical Anesthesia Plan  ASA: III  Anesthesia Plan: General   Post-op Pain Management:    Induction: Intravenous  Airway Management Planned: Oral ETT  Additional Equipment:   Intra-op Plan:   Post-operative Plan: Extubation in OR  Informed Consent: I have reviewed the patients History and Physical, chart, labs and discussed the procedure including the risks, benefits and alternatives for the proposed anesthesia with the patient or authorized representative who has indicated his/her understanding and acceptance.   Dental advisory given  Plan Discussed with: CRNA and Surgeon  Anesthesia Plan Comments: (Plan routine monitors, GETA)         Anesthesia Quick Evaluation

## 2015-05-25 NOTE — Interval H&P Note (Signed)
History and Physical Interval Note:  05/25/2015 11:48 AM  [name]  has presented today for surgery, with the diagnosis of Incisional hernia   The various methods of treatment have been discussed with the patient and family. After consideration of risks, benefits and other options for treatment, the patient has consented to  Procedure(s): [place]  (N/A) INSERTION OF MESH (N/A) as a surgical intervention .  The patient's history has been reviewed, patient examined, no change in status, stable for surgery.  I have reviewed the patient's chart and labs.  Questions were answered to the patient's satisfaction.     Arhan Mcmanamon

## 2015-05-25 NOTE — Anesthesia Postprocedure Evaluation (Signed)
Anesthesia Post Note  Patient: Envy Lust  Procedure(s) Performed: Procedure(s) (LRB): LAPAROSCOPIC INCISIONAL HERNIA WITH MESH  (N/A) INSERTION OF MESH (N/A)  Patient location during evaluation: PACU Anesthesia Type: General Level of consciousness: awake and alert, oriented and patient cooperative Pain management: pain level controlled Vital Signs Assessment: post-procedure vital signs reviewed and stable Respiratory status: spontaneous breathing, nonlabored ventilation and respiratory function stable Cardiovascular status: blood pressure returned to baseline and stable Postop Assessment: no signs of nausea or vomiting Anesthetic complications: no    Last Vitals:  Filed Vitals:   05/25/15 1500 05/25/15 1515  BP: 123/66 104/61  Pulse: 78 82  Temp:    Resp: 18 16    Last Pain:  Filed Vitals:   05/25/15 1519  PainSc: 3                  Helton Oleson,E. Nashaly Dorantes

## 2015-05-26 ENCOUNTER — Encounter (HOSPITAL_COMMUNITY): Payer: Self-pay | Admitting: General Surgery

## 2015-05-26 LAB — CBC
HEMATOCRIT: 32.3 % — AB (ref 36.0–46.0)
HEMOGLOBIN: 10.8 g/dL — AB (ref 12.0–15.0)
MCH: 30.7 pg (ref 26.0–34.0)
MCHC: 33.4 g/dL (ref 30.0–36.0)
MCV: 91.8 fL (ref 78.0–100.0)
Platelets: 183 10*3/uL (ref 150–400)
RBC: 3.52 MIL/uL — ABNORMAL LOW (ref 3.87–5.11)
RDW: 13.1 % (ref 11.5–15.5)
WBC: 7.9 10*3/uL (ref 4.0–10.5)

## 2015-05-26 LAB — GLUCOSE, CAPILLARY
GLUCOSE-CAPILLARY: 161 mg/dL — AB (ref 65–99)
Glucose-Capillary: 183 mg/dL — ABNORMAL HIGH (ref 65–99)
Glucose-Capillary: 158 mg/dL — ABNORMAL HIGH (ref 65–99)
Glucose-Capillary: 137 mg/dL — ABNORMAL HIGH (ref 65–99)

## 2015-05-26 LAB — BASIC METABOLIC PANEL
ANION GAP: 8 (ref 5–15)
BUN: 11 mg/dL (ref 6–20)
CALCIUM: 8.8 mg/dL — AB (ref 8.9–10.3)
CO2: 23 mmol/L (ref 22–32)
Chloride: 107 mmol/L (ref 101–111)
Creatinine, Ser: 0.63 mg/dL (ref 0.44–1.00)
GFR calc Af Amer: >60 mL/min (ref >60)
GFR calc non Af Amer: >60 mL/min (ref >60)
GLUCOSE: 140 mg/dL — AB (ref 65–99)
Potassium: 3.9 mmol/L (ref 3.5–5.1)
Sodium: 138 mmol/L (ref 135–145)

## 2015-05-26 MED ORDER — METHOCARBAMOL 500 MG PO TABS: 500.0000 mg | ORAL_TABLET | Freq: Four times a day (QID) | ORAL | Status: DC | PRN

## 2015-05-26 MED ORDER — FUROSEMIDE 40 MG PO TABS: 40.0000 mg | ORAL_TABLET | Freq: Every day | ORAL | Status: DC

## 2015-05-26 MED ORDER — OXYCODONE HCL 5 MG PO TABS: 5.0000 mg | ORAL_TABLET | ORAL | Status: DC | PRN

## 2015-05-26 NOTE — Progress Notes (Signed)
1 Day Post-Op  Subjective: Some nausea this am, pain controlled, up in chair, doing well  Objective: Vital signs in last 24 hours: Temp:  [97.3 F (36.3 C)-98.3 F (36.8 C)] 97.8 F (36.6 C) (03/17 0936) Pulse Rate:  [78-88] 81 (03/17 0936) Resp:  [16-24] 20 (03/17 0936) BP: (104-123)/(55-70) 104/60 mmHg (03/17 0936) SpO2:  [99 %-100 %] 100 % (03/17 0936) Weight:  [68.096 kg (150 lb 2 oz)] 68.096 kg (150 lb 2 oz) (03/16 1041) Last BM Date: 05/26/15  Intake/Output from previous day: 03/16 0701 - 03/17 0700 In: 1519.2 [P.O.:460; I.V.:1059.2] Out: 1385 [Urine:1375; Blood:10] Intake/Output this shift: Total I/O In: 300 [P.O.:300] Out: -  Lungs clear cv rr abd approp tender incisions clean  Lab Results:   Recent Labs  05/25/15 1155 05/26/15 0420  WBC 7.0 7.9  HGB 13.4 10.8*  HCT 40.2 32.3*  PLT 234 183   BMET  Recent Labs  05/25/15 1155 05/26/15 0420  NA 139 138  K 4.0 3.9  CL 103 107  CO2 24 23  GLUCOSE 173* 140*  BUN 16 11  CREATININE 0.70 0.63  CALCIUM 10.1 8.8*   PT/INR  Recent Labs  05/25/15 1155  LABPROT 14.3  INR 1.09    Anti-infectives: Anti-infectives    Start     Dose/Rate Route Frequency Ordered Stop   05/25/15 1200  ceFAZolin (ANCEF) IVPB 2 g/50 mL premix     2 g 100 mL/hr over 30 Minutes Intravenous To [facility]Stay Surgical 05/24/15 1025 05/25/15 1217      Assessment/Plan: POD 1 lap loa/incisional hernia repair  Po pain meds with iv backup pulm toilet Dc foley Lovenox, scds Will check hct in am again Home next 24-48 hours  [facility] 05/26/2015

## 2015-05-26 NOTE — Discharge Instructions (Signed)
[facility] LAPAROSCOPIC SURGERY: POST OP INSTRUCTIONS  Always review your discharge instruction sheet given to you by the facility where your surgery was performed. IF YOU HAVE DISABILITY OR FAMILY LEAVE FORMS, YOU MUST BRING THEM TO THE OFFICE FOR PROCESSING.   DO NOT GIVE THEM TO YOUR DOCTOR.  1. A prescription for pain medication may be given to you upon discharge.  Take your pain medication as prescribed, if needed.  If narcotic pain medicine is not needed, then you may take acetaminophen (Tylenol), naprosyn (Alleve), or ibuprofen (Advil) as needed. 2. Take your usually prescribed medications unless otherwise directed. 3. If you need a refill on your pain medication, please contact your pharmacy.  They will contact our office to request authorization. Prescriptions will not be filled after 5pm or on week-ends. 4. You should follow a light diet the first few days after arrival home, such as soup and crackers, etc.  Be sure to include lots of fluids daily. 5. Most patients will experience some swelling and bruising in the area of the incisions.  Ice packs will help.  Swelling and bruising can take several days to resolve.  6. It is common to experience some constipation if taking pain medication after surgery.  Increasing fluid intake and taking a stool softener (such as Colace) will usually help or prevent this problem from occurring.  A mild laxative (Milk of Magnesia or Miralax) should be taken according to package instructions if there are no bowel movements after 48 hours. 7. Unless discharge instructions indicate otherwise, you may remove your bandages 48 hours after surgery, and you may shower at that time.  You may have steri-strips (small skin tapes) in place directly over the incision.  These strips should be left on the skin for 7-10 days.  If your surgeon used skin glue on the incision, you may shower in 24 hours.  The glue will flake  off over the next 2-3 weeks.  Any sutures or staples will be removed at the office during your follow-up visit. 8. ACTIVITIES:  You may resume regular (light) daily activities beginning the next day--such as daily self-care, walking, climbing stairs--gradually increasing activities as tolerated.  You may have sexual intercourse when it is comfortable.  Refrain from any heavy lifting or straining until approved by your doctor. a. You may drive when you are no longer taking prescription pain medication, you can comfortably wear a seatbelt, and you can safely maneuver your car and apply brakes. b. RETURN TO WORK:  __________________________________________________________ 9. You should see your doctor in the office for a follow-up appointment approximately 2-3 weeks after your surgery.  Make sure that you call for this appointment within a day or two after you arrive home to insure a convenient appointment time. 10. OTHER INSTRUCTIONS: __________________________________________________________________________________________________________________________ __________________________________________________________________________________________________________________________ WHEN TO CALL YOUR DOCTOR: 1. Fever over 101.0 2. Inability to urinate 3. Continued bleeding from incision. 4. Increased pain, redness, or drainage from the incision. 5. Increasing abdominal pain  The clinic staff is available to answer your questions during regular business hours.  Please don't hesitate to call and ask to speak to one of the nurses for clinical concerns.  If you have a medical emergency, go to the nearest emergency room or call 911.  A surgeon from [facility] is always on call at the hospital. [address], [address], [place], [place]  [address] ? [address] [address], [place], [place]   [address] [phone] ? [phone] ? [phone] Web site: [email or web address]  

## 2015-05-26 NOTE — Progress Notes (Signed)
Pt feels nauseated- does not want me to call MD for extra zofran order. Asked that i wait just a little while before giving morning medications

## 2015-05-27 LAB — CBC
HCT: 31.4 % — ABNORMAL LOW (ref 36.0–46.0)
Hemoglobin: 9.9 g/dL — ABNORMAL LOW (ref 12.0–15.0)
MCH: 29.3 pg (ref 26.0–34.0)
MCHC: 31.5 g/dL (ref 30.0–36.0)
MCV: 92.9 fL (ref 78.0–100.0)
PLATELETS: 174 10*3/uL (ref 150–400)
RBC: 3.38 MIL/uL — ABNORMAL LOW (ref 3.87–5.11)
RDW: 13.3 % (ref 11.5–15.5)
WBC: 7.6 10*3/uL (ref 4.0–10.5)

## 2015-05-27 LAB — GLUCOSE, CAPILLARY: GLUCOSE-CAPILLARY: 157 mg/dL — AB (ref 65–99)

## 2015-05-27 NOTE — Discharge Summary (Signed)
Physician Discharge Summary  Patient ID: [name] MRN: [id number] DOB/AGE: 64/22/1952 64 y.o.  Admit date: 05/25/2015 Discharge date: 05/27/2015  Admission Diagnoses:  Discharge Diagnoses:  Active Problems:   Incisional hernia   Discharged Condition: good  Hospital Course: uneventful post op recovery s/p surgery.  Did well.  Advanced diet and activity.  Discharged home POD#2  Consults: None  Significant Diagnostic Studies:   Treatments: surgery: laparoscopic incisional hernia repair with mesh  Discharge Exam: Blood pressure 133/68, pulse 99, temperature 98.5 F (36.9 C), temperature source Oral, resp. rate 20, height 5' 4.5" (1.638 m), weight 68.096 kg (150 lb 2 oz), SpO2 100 %. General appearance: alert, cooperative and no distress Resp: clear to auscultation bilaterally Incision/Wound:abdomen soft, incisions clean  Disposition: 01-Home or Self Care     Medication List    TAKE these medications        aspirin 81 MG tablet  Take 1 tablet (81 mg total) by mouth daily.     carvedilol 25 MG tablet  Commonly known as:  COREG  Take 0.5 tablets (12.5 mg total) by mouth 2 (two) times daily.     conjugated estrogens vaginal cream  Commonly known as:  PREMARIN  Place 1 Applicatorful vaginally daily.     furosemide 40 MG tablet  Commonly known as:  LASIX  TAKE 1 TABLET (40 MG TOTAL) BY MOUTH DAILY.     glipiZIDE 5 MG tablet  Commonly known as:  GLUCOTROL  TAKE 1/2  TABLETS (2.5 MG TOTAL) BY MOUTH 2 (TWO) TIMES DAILY BEFORE MEAL.     glucose blood test strip  Commonly known as:  ONETOUCH VERIO  Check 2 times daily. Use as instructed     INVOKANA 100 MG Tabs tablet  Generic drug:  canagliflozin  TAKE 1 TABLET BY MOUTH EVERY MORNING     losartan 25 MG tablet  Commonly known as:  COZAAR  Take 1 tablet (25 mg total) by mouth 2 (two) times daily.     metFORMIN 1000 MG tablet  Commonly known as:  GLUCOPHAGE  TAKE 1 TABLET (1,000 MG TOTAL) BY MOUTH 2 (TWO)  TIMES DAILY WITH A MEAL.     methocarbamol 500 MG tablet  Commonly known as:  ROBAXIN  Take 1 tablet (500 mg total) by mouth every 6 (six) hours as needed for muscle spasms.     nitroGLYCERIN 0.4 MG SL tablet  Commonly known as:  NITROSTAT  Place 1 tablet (0.4 mg total) under the tongue every 5 (five) minutes as needed for chest pain.     oxyCODONE 5 MG immediate release tablet  Commonly known as:  Oxy IR/ROXICODONE  Take 1-2 tablets (5-10 mg total) by mouth every 4 (four) hours as needed for moderate pain.     rivaroxaban 20 MG Tabs tablet  Commonly known as:  XARELTO  Take 1 tablet (20 mg total) by mouth daily with supper.     rosuvastatin 10 MG tablet  Commonly known as:  CRESTOR  Take 1 tablet (10 mg total) by mouth daily.     TRULICITY 1.5 0000000 Sopn  Generic drug:  Dulaglutide  Inject 1.5 mg into the skin every Sunday.           Follow-up Information    Follow up with [facility], MD In 2 weeks.   Specialty:  General Surgery   Contact information:   [place] [address] [phone]       Signed: Harl Bowie 05/27/2015, 8:58  AM

## 2015-05-27 NOTE — Progress Notes (Signed)
Patient with a temp of 100.3 orally, encouraged deep breathing and to use incentive spirometer. IS reached 1750. Will continue to monitor.

## 2015-05-27 NOTE — Progress Notes (Signed)
Patient ID: [name], female   DOB: [birth year], 65 y.o.   MRN: [id number]  Feels much better and wants to go home Pain controlled Tolerating po Abdomen soft  Plan: Discharge home

## 2015-07-05 ENCOUNTER — Ambulatory Visit (INDEPENDENT_AMBULATORY_CARE_PROVIDER_SITE_OTHER): Payer: BLUE CROSS/BLUE SHIELD | Admitting: *Deleted

## 2015-07-05 DIAGNOSIS — Z950 Presence of cardiac pacemaker: Secondary | ICD-10-CM

## 2015-07-05 DIAGNOSIS — I255 Ischemic cardiomyopathy: Secondary | ICD-10-CM

## 2015-07-05 DIAGNOSIS — I5042 Chronic combined systolic (congestive) and diastolic (congestive) heart failure: Secondary | ICD-10-CM

## 2015-07-05 NOTE — Progress Notes (Signed)
Remote pacemaker transmission.   

## 2015-07-07 ENCOUNTER — Telehealth: Payer: Self-pay | Admitting: Cardiovascular Disease

## 2015-07-07 NOTE — Telephone Encounter (Signed)
Returned call to patient. She is having pain, fatigue, generally just feels poorly. Thinks is related to the crestor. She notes she has tried lipitor in the past and this produced similar effects.  Advised to hold med, likely would do 2 week statin holiday, and I will get new med/dose recommendation from provider and return her call. Pt agreeable to this plan, voiced understanding of recommendations.

## 2015-07-07 NOTE — Telephone Encounter (Signed)
Agree with drug holiday. If patient is willing we could try Crestor 5mg  three times weekly. She can also be scheduled for lipid clinic appointment and we can discuss other options if she is willing. Thanks!

## 2015-07-07 NOTE — Telephone Encounter (Signed)
Follow Up  Pt c/o medication issue: 1. Name of Medication:rosuvastatin (CRESTOR) 10 MG tablet  4. What is your medication issue? Pt request an alternate medication. She feels as if she is 65 years old. She states that her pain has gotten worse. Request a call back to discuss this medication.

## 2015-07-07 NOTE — Telephone Encounter (Signed)
Pt amenable to lipid clinic consultation. Set up for 5/12 at 240pm; best I was able to accomodate for current available schedule.

## 2015-07-14 ENCOUNTER — Other Ambulatory Visit: Payer: Self-pay

## 2015-07-17 ENCOUNTER — Other Ambulatory Visit: Payer: Self-pay | Admitting: *Deleted

## 2015-07-17 MED ORDER — DULAGLUTIDE 1.5 MG/0.5ML ~~LOC~~ SOAJ: 1.5000 mg | SUBCUTANEOUS | Status: DC

## 2015-07-21 ENCOUNTER — Ambulatory Visit (INDEPENDENT_AMBULATORY_CARE_PROVIDER_SITE_OTHER): Payer: BLUE CROSS/BLUE SHIELD | Admitting: Pharmacist Clinician (PhC)/ Clinical Pharmacy Specialist

## 2015-07-21 ENCOUNTER — Encounter: Payer: Self-pay | Admitting: Pharmacist Clinician (PhC)/ Clinical Pharmacy Specialist

## 2015-07-21 VITALS — Ht 64.0 in | Wt 150.2 lb

## 2015-07-21 DIAGNOSIS — E785 Hyperlipidemia, unspecified: Secondary | ICD-10-CM

## 2015-07-21 NOTE — Assessment & Plan Note (Signed)
Currently her cholesterol numbers are excellent, but she unfortunately is unable to tolerate 3 different statin drugs.  We will give her another 2 weeks wash-out to get over the myalgias.  At that time she will let us know and we will start on ezetimibe 10 mg.  If she tolerates, will draw labs after 8 weeks and re-assess.

## 2015-07-21 NOTE — Patient Instructions (Signed)
We will start the ezetimibe 10 mg tablets in about 2-3 weeks, once the pain from Crestor has resolved.    Please call at that time and we will send in a prescription - just want to be sure you are feeling better.  Repeat labs about 8 weeks after starting the ezetimibe.    Cholesterol Cholesterol is a fat. Your body needs a small amount of cholesterol. Cholesterol may build up in your blood vessels. This increases your chance of having a heart attack or stroke. You cannot feel your cholesterol levels. The only way to know your cholesterol level is high is with a blood test. Keep your test results. Work with your doctor to keep your cholesterol at a good level. WHAT DO THE TEST RESULTS MEAN?  Total cholesterol is how much cholesterol is in your blood.  LDL is bad cholesterol. This is the type that can build up. You want LDL to be low.  HDL is good cholesterol. It cleans your blood vessels and carries LDL away. You want HDL to be high.  Triglycerides are fat that the body can burn for energy or store. WHAT ARE GOOD LEVELS OF CHOLESTEROL?  Total cholesterol below 200.  LDL below 100 for people at risk. Below 70 for those at very high risk.  HDL above 50 is good. Above 60 is best.  Triglycerides below 150. HOW CAN I LOWER MY CHOLESTEROL?  Diet. Follow your diet programs as told by your doctor.  Choose fish, white meat chicken, roasted [place], or baked [place]. Try not to eat red meat, fried foods, or processed meats such as sausage and lunch meats.  Eat lots of fresh fruits and vegetables.  Choose whole grains, beans, pasta, potatoes, and cereals.  Use only small amounts of olive, corn, or canola oils.  Try not to eat butter, mayonnaise, shortening, or palm kernel oils.  Try not to eat foods with trans fats.  Drink skim or nonfat milk. Eat low-fat or nonfat yogurt and cheeses. Try not to drink whole milk or cream. Try not to eat ice cream, egg yolks, and full-fat  cheeses.  Healthy desserts include angel food cake, ginger snaps, animal crackers, hard candy, popsicles, and low-fat or nonfat frozen yogurt. Try not to eat pastries, cakes, pies, and cookies.  Exercise. Follow your exercise programs as told by your doctor.  Be more active. You can try gardening, walking, or taking the stairs. Ask your doctor about how you can be more active.  Medicine. Take medicine as told by your doctor.   This information is not intended to replace advice given to you by your health care provider. Make sure you discuss any questions you have with your health care provider.   Document Released: 05/24/2008 Document Revised: 03/18/2014 Document Reviewed: 12/09/2012 Elsevier Interactive Patient Education [employer].

## 2015-07-21 NOTE — Progress Notes (Signed)
07/21/2015 Roger Vitorino 1951-03-04 [id number]   HPI:  [name] is a 65 y.o. female patient of Dr Sallyanne Kuster, who presents today for a lipid clinic evaluation.  About 2 weeks ago she stopped her rosuvastatin 10 mg, which she was taking only on Mondays, Wednesdays and Fridays.  It was causing her to have fatigue and muscular pains in her back, shoulders and hips.   In the past she tried simvastatin and atorvastatin, with the same results.  She is still feeling some of the aches today.  RF: MI (2004 - per patient this was a result of chemotherapy); CABG (July 2014) x 3; AF on Xarelto; ischemic cardiomyopathy    Meds:  None for cholesterol   Intolerant/previously tried: simvastatin 20 mg (10/2010-5-/2014); atorvastatin 80 mg (07/2012 - 01/2015); rosuvastatin 10 mg tiw (06/2015)               Family history: no significant cardiac history; mother had diabetes;   Diet: eats mainly home cooked foods, from scratch; no boxed or prepared foods; no fried foods  Exercise:  Hasn't been able to do much with myalgias, but previously walked most days  Labs: Results for SOO, BOIVIN (MRN [id number]) as of 07/21/2015 14:52  Ref. Range 12/19/2010 11:04 07/18/2012 05:00 09/23/2013 13:30 04/07/2014 08:20 04/10/2015 10:00  Cholesterol Latest Ref Range: 0-200 mg/dL 198 144 114 110 114  Triglycerides Latest Ref Range: 0.0-149.0 mg/dL 136.0 530 (H) 98.0 82.0 111.0  HDL Cholesterol Latest Ref Range: >39.00 mg/dL 52.50 39 (L) 49.90 55.70 56.60  LDL (calc) Latest Ref Range: 0-99 mg/dL 118 (H) UNABLE TO CALCULA... 45 38 36  VLDL Latest Ref Range: 0.0-40.0 mg/dL 27.2 UNABLE TO CALCULA... 19.6 16.4 22.2  Total CHOL/HDL Ratio Unknown 4 3.7 2 2 2   NonHDL Unknown   64.10 54.30 57.77       Current Outpatient Prescriptions  Medication Sig Dispense Refill  . aspirin 81 MG tablet Take 1 tablet (81 mg total) by mouth daily. 30 tablet   . carvedilol (COREG) 25 MG tablet Take 0.5 tablets (12.5 mg total) by mouth 2 (two)  times daily. 60 tablet 2  . conjugated estrogens (PREMARIN) vaginal cream Place 1 Applicatorful vaginally daily. (Patient not taking: Reported on 05/23/2015) 42.5 g 12  . Dulaglutide (TRULICITY) 1.5 0000000 SOPN Inject 1.5 mg into the skin every Sunday. **PT NEEDS FOLLOW UP APPT FOR FUTURE REFILLS** 4 pen 1  . furosemide (LASIX) 40 MG tablet TAKE 1 TABLET (40 MG TOTAL) BY MOUTH DAILY. 90 tablet 3  . glipiZIDE (GLUCOTROL) 5 MG tablet TAKE 1/2  TABLETS (2.5 MG TOTAL) BY MOUTH 2 (TWO) TIMES DAILY BEFORE MEAL. 28 tablet 1  . glucose blood (ONETOUCH VERIO) test strip Check 2 times daily. Use as instructed (Patient not taking: Reported on 05/23/2015) 200 each 3  . INVOKANA 100 MG TABS tablet TAKE 1 TABLET BY MOUTH EVERY MORNING 30 tablet 9  . losartan (COZAAR) 25 MG tablet Take 1 tablet (25 mg total) by mouth 2 (two) times daily. 180 tablet 2  . metFORMIN (GLUCOPHAGE) 1000 MG tablet TAKE 1 TABLET (1,000 MG TOTAL) BY MOUTH 2 (TWO) TIMES DAILY WITH A MEAL. 60 tablet 0  . methocarbamol (ROBAXIN) 500 MG tablet Take 1 tablet (500 mg total) by mouth every 6 (six) hours as needed for muscle spasms. 20 tablet 0  . nitroGLYCERIN (NITROSTAT) 0.4 MG SL tablet Place 1 tablet (0.4 mg total) under the tongue every 5 (five) minutes as needed for chest pain. 25 tablet 3  .  oxyCODONE (OXY IR/ROXICODONE) 5 MG immediate release tablet Take 1-2 tablets (5-10 mg total) by mouth every 4 (four) hours as needed for moderate pain. 30 tablet 0  . rivaroxaban (XARELTO) 20 MG TABS tablet Take 1 tablet (20 mg total) by mouth daily with supper. 30 tablet 5   No current facility-administered medications for this visit.    Allergies  Allergen Reactions  . Statins Other (See Comments)    Myalgias with rosuvastatin, atorvastatin and simvastatin     Past Medical History  Diagnosis Date  . Hyperlipidemia   . Hypertension   . Allergic rhinitis   . Cataract   . CAD (coronary artery disease)     a. 2004: s/p MI in [place]. No  PCI->Medical RX;  b. 07/2012 Cath: LM 30-40, LAD 70p, 70/25m, D1 80-90p, OM1 small 90p, OM2 large 80-90p, 3m, 70-80d, RCA 20-30 diff, EF 40%, glob HK.s/p CABG  . Ischemic cardiomyopathy     a. 07/2012 Echo: EF 35%, Sev inferoseptal HK, mildly dil LA, Peak PASP 43mmHg.  Marland Kitchen PAF (paroxysmal atrial fibrillation) ([place])     a. 07/2012: Amio and xarelto initiated.  Marland Kitchen LBBB (left bundle branch block)     a. intermittent - present during rapid afib 07/2012.  Marland Kitchen SBO (small bowel obstruction) ([place])   . AICD (automatic cardioverter/defibrillator) present 01/2013    Biventricular cardiac pacemaker in situ   . Exertional shortness of breath   . Diabetes mellitus type II   . Neuromuscular disorder ([place])     Patient reports chronic numbness in the right foot related to previous surgery on the right leg and "nerve damage"  . Cancer of left breast [facility]) 2002    Patient reports left breast cancer diagnosis in 2002 treated with bilateral mastectomy positive lymph nodes with left axillary dissection followed by chemotherapy of unknown type    Height 5\' 4"  (1.626 m), weight 150 lb 3.2 oz (68.13 kg).    Tommy Medal PharmD CPP [facility]

## 2015-08-04 ENCOUNTER — Telehealth: Payer: Self-pay | Admitting: Internal Medicine

## 2015-08-04 ENCOUNTER — Other Ambulatory Visit: Payer: Self-pay | Admitting: Internal Medicine

## 2015-08-04 MED ORDER — GLIPIZIDE 5 MG PO TABS: ORAL_TABLET | ORAL | Status: DC

## 2015-08-04 NOTE — Telephone Encounter (Signed)
PT said she needs enough Glipizide called in to get her to her appt in July

## 2015-08-10 ENCOUNTER — Telehealth: Payer: Self-pay | Admitting: Pharmacist

## 2015-08-10 ENCOUNTER — Telehealth: Payer: Self-pay | Admitting: Internal Medicine

## 2015-08-10 NOTE — Telephone Encounter (Signed)
PT called and said that he meter just died on her, she wants to know what would be the best one to get or if it would be best to go through her insurance with one?  Requests call back.

## 2015-08-10 NOTE — Telephone Encounter (Signed)
Returned call to patient about muscle pains after being off medication for several weeks. She states she believes the pain is more in the bone rather than the muscles at this point. She agrees that she does not think it is the medication that is causing the pain. She will go to PCP to be evaluated for other causes of pain. She is wishing to remain off the medication until we have a better picture of what is going on with her body. I agreed this was the best plan. She said she would ask her PCP to order a lipid panel with any labs he ordered so that we can evaluate her LDL off statin medications. She will call back if we can be of further assistance.

## 2015-08-11 ENCOUNTER — Ambulatory Visit (INDEPENDENT_AMBULATORY_CARE_PROVIDER_SITE_OTHER): Payer: BLUE CROSS/BLUE SHIELD | Admitting: Family Medicine

## 2015-08-11 ENCOUNTER — Encounter: Payer: Self-pay | Admitting: Family Medicine

## 2015-08-11 ENCOUNTER — Telehealth: Payer: Self-pay | Admitting: Internal Medicine

## 2015-08-11 VITALS — BP 100/60 | HR 98 | Temp 98.3°F | Resp 12 | Ht 64.0 in | Wt 152.3 lb

## 2015-08-11 DIAGNOSIS — IMO0001 Reserved for inherently not codable concepts without codable children: Secondary | ICD-10-CM

## 2015-08-11 DIAGNOSIS — G8929 Other chronic pain: Secondary | ICD-10-CM | POA: Insufficient documentation

## 2015-08-11 DIAGNOSIS — R5383 Other fatigue: Secondary | ICD-10-CM | POA: Diagnosis not present

## 2015-08-11 DIAGNOSIS — M549 Dorsalgia, unspecified: Secondary | ICD-10-CM

## 2015-08-11 DIAGNOSIS — E049 Nontoxic goiter, unspecified: Secondary | ICD-10-CM | POA: Diagnosis not present

## 2015-08-11 DIAGNOSIS — D649 Anemia, unspecified: Secondary | ICD-10-CM | POA: Diagnosis not present

## 2015-08-11 DIAGNOSIS — E1100 Type 2 diabetes mellitus with hyperosmolarity without nonketotic hyperglycemic-hyperosmolar coma (NKHHC): Secondary | ICD-10-CM

## 2015-08-11 DIAGNOSIS — M791 Myalgia: Secondary | ICD-10-CM | POA: Diagnosis not present

## 2015-08-11 DIAGNOSIS — M609 Myositis, unspecified: Secondary | ICD-10-CM

## 2015-08-11 LAB — COMPREHENSIVE METABOLIC PANEL
ALBUMIN: 4.3 g/dL (ref 3.6–5.1)
ALT: 15 U/L (ref 6–29)
AST: 14 U/L (ref 10–35)
Alkaline Phosphatase: 65 U/L (ref 33–130)
BUN: 18 mg/dL (ref 7–25)
CHLORIDE: 98 mmol/L (ref 98–110)
CO2: 28 mmol/L (ref 20–31)
Calcium: 9.6 mg/dL (ref 8.6–10.4)
Creat: 0.72 mg/dL (ref 0.50–0.99)
Glucose, Bld: 106 mg/dL — ABNORMAL HIGH (ref 65–99)
POTASSIUM: 4.2 mmol/L (ref 3.5–5.3)
Sodium: 138 mmol/L (ref 135–146)
TOTAL PROTEIN: 7.2 g/dL (ref 6.1–8.1)
Total Bilirubin: 0.5 mg/dL (ref 0.2–1.2)

## 2015-08-11 LAB — CBC WITH DIFFERENTIAL/PLATELET
BASOS PCT: 0 %
Basophils Absolute: 0 cells/uL (ref 0–200)
EOS ABS: 186 {cells}/uL (ref 15–500)
EOS PCT: 3 %
HCT: 36.8 % (ref 35.0–45.0)
Hemoglobin: 12.1 g/dL (ref 11.7–15.5)
LYMPHS PCT: 28 %
Lymphs Abs: 1736 cells/uL (ref 850–3900)
MCH: 28.9 pg (ref 27.0–33.0)
MCHC: 32.9 g/dL (ref 32.0–36.0)
MCV: 88 fL (ref 80.0–100.0)
MONOS PCT: 5 %
MPV: 10.3 fL (ref 7.5–12.5)
Monocytes Absolute: 310 cells/uL (ref 200–950)
NEUTROS ABS: 3968 {cells}/uL (ref 1500–7800)
Neutrophils Relative %: 64 %
PLATELETS: 266 10*3/uL (ref 140–400)
RBC: 4.18 MIL/uL (ref 3.80–5.10)
RDW: 14.1 % (ref 11.0–15.0)
WBC: 6.2 10*3/uL (ref 3.8–10.8)

## 2015-08-11 LAB — SEDIMENTATION RATE: SED RATE: 6 mm/h (ref 0–30)

## 2015-08-11 LAB — FERRITIN: FERRITIN: 32 ng/mL (ref 20–288)

## 2015-08-11 LAB — HEMOGLOBIN A1C
Hgb A1c MFr Bld: 6.5 % — ABNORMAL HIGH (ref <5.7)
Mean Plasma Glucose: 140 mg/dL

## 2015-08-11 LAB — VITAMIN B12: VITAMIN B 12: 328 pg/mL (ref 200–1100)

## 2015-08-11 LAB — CK: CK TOTAL: 43 U/L (ref 7–177)

## 2015-08-11 LAB — C-REACTIVE PROTEIN

## 2015-08-11 MED ORDER — BAYER MICROLET LANCETS MISC: Status: DC

## 2015-08-11 MED ORDER — GLUCOSE BLOOD VI STRP: ORAL_STRIP | Status: DC

## 2015-08-11 MED ORDER — HYDROCODONE-ACETAMINOPHEN 5-325 MG PO TABS: 1.0000 | ORAL_TABLET | Freq: Every evening | ORAL | Status: DC | PRN

## 2015-08-11 MED ORDER — BAYER CONTOUR NEXT LINK W/DEVICE KIT
PACK | Status: DC
Start: 2015-08-11 — End: 2019-08-10

## 2015-08-11 NOTE — Progress Notes (Signed)
Pre visit review using our clinic review tool, if applicable. No additional management support is needed unless otherwise documented below in the visit note. 

## 2015-08-11 NOTE — Patient Instructions (Signed)
A few things to remember from today's visit:   1. Other fatigue  - Comprehensive metabolic panel - Sedimentation Rate - C-reactive Protein - CBC with Differential/Platelet - Ferritin - Vitamin B12 - Hemoglobin A1c  2. Anemia, unspecified  - CBC with Differential/Platelet - Ferritin - Vitamin B12  3. Myalgia and myositis  - CK - HYDROcodone-acetaminophen (NORCO/VICODIN) 5-325 MG tablet; Take 1 tablet by mouth at bedtime as needed for moderate pain.  Dispense: 30 tablet; Refill: 0     Opioid medications have many side effects: constipation, nausea, vomiting,sedation, decreased psychomotor function, urinary retention, addiction among some.    We have ordered labs or studies at this visit.  It can take up to 1-2 weeks for results and processing. IF results require follow up or explanation, we will call you with instructions. Clinically stable results will be released to your [facility]. If you have not heard from [place] or cannot find your results in [facility] in 2 weeks please contact our office at [phone].  If you are not yet signed up for [facility], please consider signing up    If you sign-up for My chart, you can communicate easier with [place] in case you have any question or concern.

## 2015-08-11 NOTE — Telephone Encounter (Signed)
I contacted the pt and advised [name] is the preferred brand for her insurance. Rx for meter, test strips and lancets sent to the pt's pharmacy. Pt advised to call back if she had any issues picking the prescriptions up. Pt voiced understanding.

## 2015-08-11 NOTE — Progress Notes (Signed)
Subjective:    Patient ID: [name], female    DOB: 1950/05/17, 65 y.o.   MRN: [id number]  HPI   Ms. [name] is a 65 y.o.female here today to establish care with me, former Dr Honor Junes pt.   She has Hx of HTN,HLD,DM II, CAD, and atrial fib among some.  Last routine physical was 1-2 years ago.  She tries to follow a healthy diet and does not exercises regularly.  Concerns today muscle/bone/back pain and fatigue.   Back pain started about 2 years ago 1-2 months after heart surgery, CABG. According to pt, initially it was thought residual effects from surgery and recommended to wait and monitor. Then thought to be related to statin med, so has tried a few but not better, stopped Crestor 31 days ago and pain slightly better but not resolved.  She describes pain like "bone/meat pain"  And joints: Shoulder,back, and hips. Shoulder pain is radiated to mid arm, hip pain radiated to thighs, upper and lower back pain, limitation of ROM, mainly shoulders. She is right handed. Pain is limiting daily activities, can not longer reach things in her kitchen cabinets.  Pain is getting worse, + stiffness.SHe states that she cut her hair down, so she doe snot have to comb it often; has difficulty standing up from sitting position. No joint edema, erythema, or deformities.  Pain is sharp at times but she has a constant achy, 10/10 if active and even when walking her dog, alleviated by resting but still 6/10. + Fatigue. Pain keeps her from sleeping well.  No numbness or tingling. No rash or lesions. No abnormal wt loss or changes in appetite. Mild global headache for a month, no visual changes. She is concerned about cancer because she has hx of cancer.  S/P bilateral mastectomy and chemo for 52 weeks, not following with oncologist. Atypical cel abnormality on cervix, hysterectomy.  Hx of anemia: Not sure about etiology. She has not noted abnormal bleeding or easy bruising. No gross  hematuria.  Lab Results  Component Value Date   WBC 7.6 05/27/2015   HGB 9.9* 05/27/2015   HCT 31.4* 05/27/2015   MCV 92.9 05/27/2015   PLT 174 05/27/2015   She is on Xarelto for atrial fib.  Denies abdominal pain, nausea, vomiting, changes in bowel habits, blood in stool or melena.  Reporting "spot" in liver found on imaging around the time she was Dx with hernia, she was reassured and no further recommendation given.  04/2015: CT abdomen/pelvix:  1. Small incisional hernia through the anterior abdominal wall musculature in the right lower quadrant contains a short segment of distal small bowel (presumably ileum), without findings to suggest associated bowel incarceration or obstruction at this time. 2. Technically indeterminate area in the inferior aspect of segment 4B of the liver, favored to be benign, likely an area of fatty infiltration or focal perfusion anomaly. This could be definitively characterized with hepatic MRI with and without IV gadolinium, or could simply be followed on future CT examinations to ensure stability. 3. Atherosclerosis, including at least 3 vessel coronary artery disease. Please note that although the presence of coronary artery calcium documents the presence of coronary artery disease, the severity of this disease and any potential stenosis cannot be assessed on this non-gated CT examination. Assessment for potential risk factor modification, dietary therapy or pharmacologic therapy may be warranted, if clinically indicated. 4. Additional postoperative changes and incidental findings, as above.   Lab Results  Component Value Date  TSH 1.05 04/10/2015   She is not taking anything for pain.  Diabetes Mellitus II:  She is currently on Glucotrol,Metfromin,Trulicity,and Invokana. Problem has been stable. She denies any hypoglycemia like symptom. She is tolerating medications well. She denies polydipsia, polyuria, or polyphagia.  Last  checked in 03/2015, she was supposed to followed with endocrinologists a few months ago, planning on doing so in a couple months.     Review of Systems  Constitutional: Positive for fatigue. Negative for fever, activity change, appetite change and unexpected weight change.  HENT: Negative for facial swelling, mouth sores, nosebleeds and trouble swallowing.   Eyes: Negative for pain, redness and visual disturbance.  Respiratory: Negative for cough, shortness of breath and wheezing.   Cardiovascular: Negative for chest pain, palpitations and leg swelling.  Gastrointestinal: Negative for nausea, vomiting, abdominal pain and blood in stool.       No changes in bowel habits.  Endocrine: Negative for polydipsia, polyphagia and polyuria.  Genitourinary: Negative for dysuria, hematuria and decreased urine volume.  Musculoskeletal: Positive for myalgias, back pain and arthralgias. Negative for joint swelling and gait problem.  Skin: Negative for color change, rash and wound.  Neurological: Positive for headaches (mild.). Negative for tremors, seizures, syncope, weakness and numbness.  Hematological: Negative for adenopathy. Does not bruise/bleed easily.  Psychiatric/Behavioral: Positive for sleep disturbance (due to pain). Negative for confusion. The patient is nervous/anxious.      Current Outpatient Prescriptions on File Prior to Visit  Medication Sig Dispense Refill  . aspirin 81 MG tablet Take 1 tablet (81 mg total) by mouth daily. 30 tablet   . Blood Glucose Monitoring Suppl (BAYER CONTOUR NEXT LINK) w/Device KIT Use to check blood sugar 2 times per day. 1 kit 2  . carvedilol (COREG) 25 MG tablet Take 0.5 tablets (12.5 mg total) by mouth 2 (two) times daily. 60 tablet 2  . Dulaglutide (TRULICITY) 1.5 XL/2.4MW SOPN Inject 1.5 mg into the skin every Sunday. **PT NEEDS FOLLOW UP APPT FOR FUTURE REFILLS** 4 pen 1  . furosemide (LASIX) 40 MG tablet TAKE 1 TABLET (40 MG TOTAL) BY MOUTH DAILY. 90  tablet 3  . glipiZIDE (GLUCOTROL) 5 MG tablet TAKE 1/2  TABLETS (2.5 MG TOTAL) BY MOUTH 2 (TWO) TIMES DAILY BEFORE MEAL. 28 tablet 2  . glucose blood (BAYER CONTOUR NEXT TEST) test strip Use to check blood sugar 2 times per day. 150 each 2  . glucose blood test strip Use to check blood sugar 2 times per day. 150 each 2  . INVOKANA 100 MG TABS tablet TAKE 1 TABLET BY MOUTH EVERY MORNING 30 tablet 9  . losartan (COZAAR) 25 MG tablet Take 1 tablet (25 mg total) by mouth 2 (two) times daily. 180 tablet 2  . metFORMIN (GLUCOPHAGE) 1000 MG tablet TAKE 1 TABLET (1,000 MG TOTAL) BY MOUTH 2 (TWO) TIMES DAILY WITH A MEAL. 60 tablet 0  . nitroGLYCERIN (NITROSTAT) 0.4 MG SL tablet Place 1 tablet (0.4 mg total) under the tongue every 5 (five) minutes as needed for chest pain. 25 tablet 3  . rivaroxaban (XARELTO) 20 MG TABS tablet Take 1 tablet (20 mg total) by mouth daily with supper. 30 tablet 5   No current facility-administered medications on file prior to visit.     Past Medical History  Diagnosis Date  . Hyperlipidemia   . Hypertension   . Allergic rhinitis   . Cataract   . CAD (coronary artery disease)     a. 2004: s/p MI  in [place]. No PCI->Medical RX;  b. 07/2012 Cath: LM 30-40, LAD 70p, 70/26m D1 80-90p, OM1 small 90p, OM2 large 80-90p, 559m70-80d, RCA 20-30 diff, EF 40%, glob HK.s/p CABG  . Ischemic cardiomyopathy     a. 07/2012 Echo: EF 35%, Sev inferoseptal HK, mildly dil LA, Peak PASP 5937m.  . PMarland KitchenF (paroxysmal atrial fibrillation) ([place]   a. 07/2012: Amio and xarelto initiated.  . LMarland KitchenBB (left bundle branch block)     a. intermittent - present during rapid afib 07/2012.  . SMarland KitchenO (small bowel obstruction) ([place] . AICD (automatic cardioverter/defibrillator) present 01/2013    Biventricular cardiac pacemaker in situ   . Exertional shortness of breath   . Diabetes mellitus type II   . Neuromuscular disorder ([place]   Patient reports chronic numbness in the right foot related to previous  surgery on the right leg and "nerve damage"  . [facility]    Patient reports left breast cancer diagnosis in 2002 treated with bilateral mastectomy positive lymph nodes with left axillary dissection followed by chemotherapy of unknown type    Social History   Social History  . Marital Status: Married    Spouse Name: N/A  . Number of Children: 3  . Years of Education: N/A   Occupational History  . Office work    Social History Main Topics  . Smoking status: Former Smoker -- 1.00 packs/day for 10 years    Types: Cigarettes    Quit date: 03/11/2012  . Smokeless tobacco: Never Used  . Alcohol Use: No  . Drug Use: No  . Sexual Activity:    Partners: Female   Other Topics Concern  . 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  SpO2 Readings from Last 3 Encounters:  08/11/15 98%  05/27/15 100%  11/16/14 97%       Objective:   Physical Exam  Constitutional: She is oriented to person, place, and time. She appears well-developed and well-nourished. She does not appear ill. No distress.  HENT:  Head: Atraumatic.  Mouth/Throat: Oropharynx is clear and moist and mucous membranes are normal.  Eyes: Conjunctivae and EOM are normal. Pupils are equal, round, and reactive to light.  Neck: No muscular tenderness present. Thyromegaly (? right thyroid nodule) present.  Cardiovascular: Normal rate and regular rhythm.   No murmur heard. Pulses:      Dorsalis pedis pulses are 2+ on the right side, and 2+ on the left side.  Pulmonary/Chest: Effort normal and breath sounds normal. No respiratory distress.  Abdominal: Soft. She exhibits no mass. There is no hepatosplenomegaly. There is no tenderness.  Musculoskeletal: She  exhibits no edema or tenderness.       Right shoulder: She exhibits decreased range of motion.       Left shoulder: She exhibits decreased range of motion.       Cervical back: She exhibits no tenderness.  Decreased ROM shoulders R>L. No pain upon palpation. Hip pain elicited with flexion, limited. No significant deformity appreciated. No tenderness upon palpation of paraspinal muscles.  No local edema or erythema appreciated, no suspicious lesions.No signs of synovitis.  Lymphadenopathy:    She has no cervical adenopathy.       Right: No supraclavicular adenopathy present.       Left: No supraclavicular adenopathy present.  Neurological: She is alert and oriented to person, place, and time. She has normal strength. No sensory deficit. Coordination and gait normal.  Reflex Scores:      Bicep reflexes are 2+ on the right side and 2+ on the left side.      Patellar reflexes are 2+ on the right side and 2+ on the left side. Skin: Skin is warm. No petechiae and no rash noted. No erythema.  Psychiatric: Her speech is normal. Her mood appears anxious.       Assessment & Plan:    Akshaya was seen today for joint/muscle pain.  Diagnoses and all orders for this visit: .labs  Other fatigue  We discussed possible causes: anemia, lack of good sleep,chronic pain, systemic illness, medications among some. Some lab ordered today, further recommendations will be given accordingly.   -     Vitamin B12 -     Ferritin -     Sedimentation Rate -     C-reactive protein -     CK, CMP  Anemia, unspecified  Further recommendations will be given according to lab results. Colonoscopy due 01/2016.  -     Cancel: Vitamin B12 -     Vitamin B12 -     CBC with Differential/Platelet  Myalgia and myositis  Some possible etiologies discussed: OA, PMR among some.       Given her Hx of CAD,CHF, and atrial fib on anticoagulation NSAID's are not a good option. After discussion of a few  options + side effects she agrees with taking opioid med , she just wanted at night. F/U in 3-4 weeks. Will consider Cymbalta next OV.  -     HYDROcodone-acetaminophen (NORCO/VICODIN) 5-325 MG tablet; Take 1 tablet by mouth at bedtime as needed for moderate pain. -     Vitamin B12 -     Sedimentation Rate -     C-reactive protein  Enlarged thyroid gland  Incidental finding, normal TSH recently. Thyroid U/S will be arranged.   -     US Soft Tissue Head/Neck; Future   Uncontrolled type 2 diabetes mellitus with hyperosmolarity without coma, without long-term current use of insulin (HCC)  No changes in current management, will follow labs done today and will give further recommendations accordingly. Periodic eye exam and foot care discussed.   -     Hemoglobin A1c      -Patient advised to return sooner than planned or notify a doctor immediately if symptoms worsen or persist or new concerns arise.    Betty G. Martinique, MD  [facility]. [place] office.

## 2015-08-11 NOTE — Telephone Encounter (Signed)
Patient need the Lancets for her glucose meter Blood Glucose Monitoring Suppl [facility] NEXT called into  [place], [place] [phone] (Phone) [phone] (Fax)

## 2015-08-14 ENCOUNTER — Encounter: Payer: Self-pay | Admitting: Family Medicine

## 2015-08-15 ENCOUNTER — Ambulatory Visit
Admission: RE | Admit: 2015-08-15 | Discharge: 2015-08-15 | Disposition: A | Discharge status: Home / Self Care | Payer: BLUE CROSS/BLUE SHIELD | Source: Ambulatory Visit | Attending: Family Medicine | Admitting: Family Medicine

## 2015-08-15 DIAGNOSIS — E049 Nontoxic goiter, unspecified: Secondary | ICD-10-CM

## 2015-08-15 DIAGNOSIS — E042 Nontoxic multinodular goiter: Secondary | ICD-10-CM | POA: Diagnosis not present

## 2015-08-16 ENCOUNTER — Telehealth: Payer: Self-pay

## 2015-08-16 ENCOUNTER — Encounter: Payer: Self-pay | Admitting: Cardiology

## 2015-08-16 NOTE — Telephone Encounter (Signed)
-----   Message from Betty G Martinique, MD sent at 08/16/2015  1:48 PM EDT ----- Thyroid u/s: normal size thyroid with small nodules bilateral, not worrisome signs, so Bx is not recommended. Thanks!

## 2015-08-16 NOTE — Telephone Encounter (Signed)
Called patient. Gave imaging results. Patient verbalized understanding. Requesting lab results.

## 2015-08-17 LAB — CUP PACEART REMOTE DEVICE CHECK
Battery Voltage: 2.96 V
Brady Statistic AP VS Percent: 1 %
Brady Statistic AS VP Percent: 99 %
Brady Statistic RA Percent Paced: 1 %
Date Time Interrogation Session: 20170426080014
Implantable Lead Location: 753860
Lead Channel Pacing Threshold Amplitude: 0.75 V
Lead Channel Pacing Threshold Pulse Width: 0.4 ms
Lead Channel Pacing Threshold Pulse Width: 0.5 ms
Lead Channel Sensing Intrinsic Amplitude: 0.9 mV
Lead Channel Setting Pacing Amplitude: 2 V
Lead Channel Setting Pacing Amplitude: 2.5 V
Lead Channel Setting Pacing Pulse Width: 0.4 ms
Lead Channel Setting Pacing Pulse Width: 0.5 ms
MDC IDC LEAD IMPLANT DT: 20140721
MDC IDC LEAD IMPLANT DT: 20141117
MDC IDC LEAD IMPLANT DT: 20141117
MDC IDC LEAD LOCATION: 753858
MDC IDC LEAD LOCATION: 753859
MDC IDC MSMT BATTERY REMAINING LONGEVITY: 79 mo
MDC IDC MSMT BATTERY REMAINING PERCENTAGE: 95.5 %
MDC IDC MSMT LEADCHNL LV IMPEDANCE VALUE: 400 Ohm
MDC IDC MSMT LEADCHNL LV PACING THRESHOLD AMPLITUDE: 1.25 V
MDC IDC MSMT LEADCHNL RA IMPEDANCE VALUE: 400 Ohm
MDC IDC MSMT LEADCHNL RA PACING THRESHOLD PULSEWIDTH: 0.4 ms
MDC IDC MSMT LEADCHNL RV IMPEDANCE VALUE: 450 Ohm
MDC IDC MSMT LEADCHNL RV PACING THRESHOLD AMPLITUDE: 1 V
MDC IDC MSMT LEADCHNL RV SENSING INTR AMPL: 12 mV
MDC IDC PG SERIAL: 2981305
MDC IDC SET LEADCHNL LV PACING AMPLITUDE: 2.25 V
MDC IDC SET LEADCHNL RV SENSING SENSITIVITY: 2 mV
MDC IDC STAT BRADY AP VP PERCENT: 1 %
MDC IDC STAT BRADY AS VS PERCENT: 1 %

## 2015-08-21 NOTE — Telephone Encounter (Signed)
Spoke to patient. Gave results. Patient verbalized understanding. Will discuss med in upcoming OV.

## 2015-08-21 NOTE — Telephone Encounter (Signed)
I believe lab results and recommendations were sent through My Chart. Labs were otherwise normal, HgA1C at goal. I was recommending Cymbalta , starting at 30 mg daily for joint and back pain. She has an appt for f/u, when we can discuss results in more detail. Thanks, BJ

## 2015-08-25 ENCOUNTER — Ambulatory Visit (INDEPENDENT_AMBULATORY_CARE_PROVIDER_SITE_OTHER): Payer: BLUE CROSS/BLUE SHIELD | Admitting: Family Medicine

## 2015-08-25 ENCOUNTER — Encounter: Payer: Self-pay | Admitting: Family Medicine

## 2015-08-25 ENCOUNTER — Ambulatory Visit: Payer: BLUE CROSS/BLUE SHIELD | Admitting: Family Medicine

## 2015-08-25 VITALS — BP 116/76 | HR 96 | Temp 98.5°F | Ht 64.0 in | Wt 152.1 lb

## 2015-08-25 DIAGNOSIS — M159 Polyosteoarthritis, unspecified: Secondary | ICD-10-CM

## 2015-08-25 DIAGNOSIS — M549 Dorsalgia, unspecified: Secondary | ICD-10-CM

## 2015-08-25 DIAGNOSIS — G8929 Other chronic pain: Secondary | ICD-10-CM | POA: Diagnosis not present

## 2015-08-25 HISTORY — DX: Polyosteoarthritis, unspecified: M15.9

## 2015-08-25 MED ORDER — DULOXETINE HCL 30 MG PO CPEP: 30.0000 mg | ORAL_CAPSULE | Freq: Every day | ORAL | Status: DC

## 2015-08-25 MED ORDER — OXYCODONE-ACETAMINOPHEN 5-325 MG PO TABS: 1.0000 | ORAL_TABLET | Freq: Every evening | ORAL | Status: DC | PRN

## 2015-08-25 NOTE — Progress Notes (Signed)
HPI:   [name] is a 65 y.o. female, who is here today to follow on recent office visit. I saw her on June 20, at that time she was complaining of severe arthralgias of hips and shoulders, radiated to arms and thighs. She also has history of back pain, cervical and lower back. Lab work was done and it was otherwise normal. She was also complaining of fatigue, which is unchanged. Last office visit she received a prescription for hydrocodone to take at night, she states that it caused "little itchy", didn't help with pain so she discontinued. She wonders if she can try a different medication.  Today she is also reporting an episode of dry eye tingling sensation after the hours of walking at the mall, resolved after rest. She denies any other episodes, she denies any new symptoms.  She states that some days she feels better and pain is not as bad. Limitation of ROM mainly shoulders. Pain is exacerbated by movement, prolonged rest or standing. Alleviated with changes in position and gentle ROM. + Stiffness, aggravated with prolonged rest, more noticeable in the morning. Relieved with movement.   Review of Systems  Constitutional: Positive for fatigue (stable.). Negative for fever, chills, appetite change and unexpected weight change.  HENT: Negative for mouth sores, nosebleeds and trouble swallowing.   Eyes: Negative for visual disturbance.  Respiratory: Negative for cough, shortness of breath and wheezing.   Cardiovascular: Negative for chest pain, palpitations and leg swelling.  Gastrointestinal: Negative for nausea, vomiting, abdominal pain and blood in stool.       No changes in bowel habits.  Genitourinary: Negative for dysuria, hematuria and decreased urine volume.  Musculoskeletal: Positive for myalgias, back pain and arthralgias. Negative for joint swelling and gait problem.  Skin: Negative for color change and rash.  Neurological: Negative for tremors, seizures,  syncope, weakness and headaches.  Psychiatric/Behavioral: Positive for sleep disturbance (due to pain). Negative for confusion. The patient is nervous/anxious.       Current Outpatient Prescriptions on File Prior to Visit  Medication Sig Dispense Refill  . aspirin 81 MG tablet Take 1 tablet (81 mg total) by mouth daily. 30 tablet   . BAYER MICROLET LANCETS lancets Use as instructed 100 each 2  . Blood Glucose Monitoring Suppl (BAYER CONTOUR NEXT LINK) w/Device KIT Use to check blood sugar 2 times per day. 1 kit 2  . carvedilol (COREG) 25 MG tablet Take 0.5 tablets (12.5 mg total) by mouth 2 (two) times daily. 60 tablet 2  . Dulaglutide (TRULICITY) 1.5 QX/4.5WT SOPN Inject 1.5 mg into the skin every Sunday. **PT NEEDS FOLLOW UP APPT FOR FUTURE REFILLS** 4 pen 1  . furosemide (LASIX) 40 MG tablet TAKE 1 TABLET (40 MG TOTAL) BY MOUTH DAILY. 90 tablet 3  . glipiZIDE (GLUCOTROL) 5 MG tablet TAKE 1/2  TABLETS (2.5 MG TOTAL) BY MOUTH 2 (TWO) TIMES DAILY BEFORE MEAL. 28 tablet 2  . glucose blood (BAYER CONTOUR NEXT TEST) test strip Use to check blood sugar 2 times per day. 150 each 2  . glucose blood test strip Use to check blood sugar 2 times per day. 150 each 2  . INVOKANA 100 MG TABS tablet TAKE 1 TABLET BY MOUTH EVERY MORNING 30 tablet 9  . losartan (COZAAR) 25 MG tablet Take 1 tablet (25 mg total) by mouth 2 (two) times daily. 180 tablet 2  . metFORMIN (GLUCOPHAGE) 1000 MG tablet TAKE 1 TABLET (1,000 MG TOTAL) BY MOUTH  2 (TWO) TIMES DAILY WITH A MEAL. 60 tablet 0  . nitroGLYCERIN (NITROSTAT) 0.4 MG SL tablet Place 1 tablet (0.4 mg total) under the tongue every 5 (five) minutes as needed for chest pain. 25 tablet 3  . rivaroxaban (XARELTO) 20 MG TABS tablet Take 1 tablet (20 mg total) by mouth daily with supper. 30 tablet 5   No current facility-administered medications on file prior to visit.     Past Medical History  Diagnosis Date  . Hyperlipidemia   . Hypertension   . Allergic rhinitis    . Cataract   . CAD (coronary artery disease)     a. 2004: s/p MI in [place]. No PCI->Medical RX;  b. 07/2012 Cath: LM 30-40, LAD 70p, 70/8m D1 80-90p, OM1 small 90p, OM2 large 80-90p, 561m70-80d, RCA 20-30 diff, EF 40%, glob HK.s/p CABG  . Ischemic cardiomyopathy     a. 07/2012 Echo: EF 35%, Sev inferoseptal HK, mildly dil LA, Peak PASP 5945m.  . PMarland KitchenF (paroxysmal atrial fibrillation) ([facility]   a. 07/2012: Amio and xarelto initiated.  . LMarland KitchenBB (left bundle branch block)     a. intermittent - present during rapid afib 07/2012.  . SMarland KitchenO (small bowel obstruction) [place] . AICD (automatic cardioverter/defibrillator) present 01/2013    Biventricular cardiac pacemaker in situ   . Exertional shortness of breath   . Diabetes mellitus type II   . [place]romuscular disorder ([facility]   Patient reports chronic numbness in the right foot related to previous surgery on the right leg and "nerve damage"  . [facility] left [facility]    Patient reports left breast cancer diagnosis in 2002 treated with bilateral mastectomy positive lymph nodes with left axillary dissection followed by chemotherapy of unknown type   Allergies  Allergen Reactions  . Statins Other (See Comments)    Myalgias with rosuvastatin, atorvastatin and simvastatin     Social History   Social History  . Marital Status: Married    Spouse Name: N/A  . Number of Children: 3  . Years of Education: N/A   Occupational History  . Office work    Social History Main Topics  . Smoking status: Former Smoker -- 1.00 packs/day for 10 years    Types: Cigarettes    Quit date: 03/11/2012  . Smokeless tobacco: Never Used  . Alcohol Use: No  . Drug Use: No  . Sexual Activity:    Partners: Female   Other Topics Concern  . 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  Wt Readings from Last 3 Encounters:  08/25/15 152 lb 1 oz (68.975 kg)  08/11/15 152 lb 5 oz (69.088 kg)  07/21/15 150 lb 3.2 oz (68.13 kg)    Physical Exam  Constitutional: She is oriented to person, place, and time. She appears well-developed and well-nourished. She does not appear ill. No distress.  HENT:  Head: Atraumatic.  Mouth/Throat: Oropharynx is clear and moist and mucous membranes are normal.  Eyes: Conjunctivae are normal.  Cardiovascular: Normal rate and regular rhythm.   No murmur heard. Pulses:      Dorsalis pedis pulses are 2+ on the right  side, and 2+ on the left side.  Respiratory: Effort normal and breath sounds normal. No respiratory distress.  Musculoskeletal: She exhibits no edema or tenderness.  Decreased ROM shoulders bilateral, pain elicited.  Mild tenderness upon palpation of cervical paraspinal muscles, bilateral, adequate ROM. No trigger points.  No signs of synovitis.  Lymphadenopathy:    She has no cervical adenopathy.  Neurological: She is alert and oriented to person, place, and time. She has normal strength.  Stable gait with no assistance needed.  Skin: Skin is warm. No erythema.  Psychiatric: She has a normal mood and affect. Her speech is normal.  Well groomed, good eye contact.      ASSESSMENT AND PLAN:   Aleigh was seen today for 2 week follow up.  Diagnoses and all orders for this visit:  Generalized osteoarthrosis, involving multiple sites  She will try Percocet at bedtime, some side effects discussed. She agrees with trying Cymbalta, starting at 30 mg daily. Some side effects discussed. Physical therapy will be arrange. Follow-up in 2 months, before if needed.  -     DULoxetine (CYMBALTA) 30 MG capsule; Take 1 capsule (30 mg total) by mouth daily. -     oxyCODONE-acetaminophen (ROXICET) 5-325 MG tablet; Take 1 tablet by mouth at bedtime as needed for moderate  pain or severe pain. -     Ambulatory referral to Physical Therapy   Back pain, chronic  Han think imaging is needed at this time, episode of tingling right thigh resolved but if he becomes recurrent we may need to consider lumbar MRI. Physical therapy and Cymbalta might also help. Follow-up in 2 months.      -She was advised to return or notify a doctor immediately if symptoms worsen or persist or new concerns arise, she voices understanding.       Tylie Golonka G. [place], MD  [facility]. [place] office.

## 2015-08-25 NOTE — Patient Instructions (Addendum)
A few things to remember from today's visit:   1. Generalized osteoarthrosis, involving multiple sites  - DULoxetine (CYMBALTA) 30 MG capsule; Take 1 capsule (30 mg total) by mouth daily.  Dispense: 30 capsule; Refill: 1 - oxyCODONE-acetaminophen (ROXICET) 5-325 MG tablet; Take 1 tablet by mouth at bedtime as needed for moderate pain or severe pain.  Dispense: 20 tablet; Refill: 0 - Ambulatory referral to Physical Therapy    Pain is chronic and the goal with medication is to decrease pain to a level when you are able to function, pain will not be zero. Opioid medications have many side effects: constipation, nausea, vomiting,sedation, decreased psychomotor function, urinary retention, addiction among some.        If you sign-up for My chart, you can communicate easier with [place] in case you have any question or concern.

## 2015-08-25 NOTE — Progress Notes (Signed)
Pre visit review using our clinic review tool, if applicable. No additional management support is needed unless otherwise documented below in the visit note. 

## 2015-08-28 ENCOUNTER — Encounter: Payer: Self-pay | Admitting: Family Medicine

## 2015-09-04 ENCOUNTER — Encounter: Payer: Self-pay | Admitting: Family Medicine

## 2015-09-04 ENCOUNTER — Encounter: Payer: Self-pay | Admitting: Cardiovascular Disease

## 2015-09-05 ENCOUNTER — Telehealth: Payer: Self-pay | Admitting: Pharmacist

## 2015-09-05 DIAGNOSIS — E785 Hyperlipidemia, unspecified: Secondary | ICD-10-CM

## 2015-09-05 NOTE — Telephone Encounter (Signed)
See telephone note from 09/05/15.

## 2015-09-05 NOTE — Telephone Encounter (Signed)
Called patient about email. She is wishing to remain off statin medications at this time. She has not had a recent lipid panel being off medications. Will have her go to lab for fasting lipid panel next week as she is busy this week. Pending these results will apply for PCSK9i (Repatha pens as she is comfortable with the pens since she uses Trulicity).

## 2015-09-10 ENCOUNTER — Other Ambulatory Visit: Payer: Self-pay | Admitting: Internal Medicine

## 2015-09-13 ENCOUNTER — Ambulatory Visit: Payer: BLUE CROSS/BLUE SHIELD | Attending: Family Medicine

## 2015-09-13 DIAGNOSIS — M545 Low back pain, unspecified: Secondary | ICD-10-CM

## 2015-09-13 DIAGNOSIS — M6281 Muscle weakness (generalized): Secondary | ICD-10-CM | POA: Diagnosis not present

## 2015-09-13 DIAGNOSIS — M25611 Stiffness of right shoulder, not elsewhere classified: Secondary | ICD-10-CM | POA: Insufficient documentation

## 2015-09-13 DIAGNOSIS — M25612 Stiffness of left shoulder, not elsewhere classified: Secondary | ICD-10-CM | POA: Insufficient documentation

## 2015-09-13 DIAGNOSIS — M25511 Pain in right shoulder: Secondary | ICD-10-CM | POA: Diagnosis not present

## 2015-09-13 DIAGNOSIS — M25512 Pain in left shoulder: Secondary | ICD-10-CM

## 2015-09-13 NOTE — Patient Instructions (Signed)
Hold all shoulder stretches 5-10 seconds and perform 5-10 times, 3 times a day.    Sitting upright, slide forearm forward along table, bending from the waist until a stretch is felt. Copyright  VHI. All rights reserved.    Clasp hands together and raise arms above head, keeping elbows as straight as possible. Can be done sitting or lying.   Copyright  VHI. All rights reserved.  Perform all exercises below:  Hold _20___ seconds. Repeat _3___ times.  Do __3__ sessions per day. CAUTION: Movement should be gentle, steady and slow.  Knee to Chest  Lying supine, bend involved knee to chest. Perform with each leg.   Lumbar Rotation: Caudal - Bilateral (Supine)  Feet and knees together, arms outstretched, rotate knees left, turning head in opposite direction, until stretch is felt.      HIP: Hamstrings - Short Sitting   Rest leg on raised surface. Keep knee straight. Lift chest.   [facility] [address], [place], [place] [address] Phone # [phone] Fax [phone]

## 2015-09-13 NOTE — Therapy (Signed)
[facility] [address], [place], [place], [address] Phone: [phone]   Fax:  [phone]  Physical Therapy Evaluation  Patient Details  Name: [name] MRN: [id number] Date of Birth: 1950-03-14 Referring Provider: Martinique, Betty, MD  Encounter Date: 09/13/2015      PT End of Session - 09/13/15 1536    Visit Number 1   Date for PT Re-Evaluation 11/08/15   PT Start Time 1444   PT Stop Time 1531   PT Time Calculation (min) 47 min   Activity Tolerance Patient tolerated treatment well   Behavior During Therapy [facility] for tasks assessed/performed      Past Medical History  Diagnosis Date  . Hyperlipidemia   . Hypertension   . Allergic rhinitis   . Cataract   . CAD (coronary artery disease)     a. 2004: s/p MI in [place]. No PCI->Medical RX;  b. 07/2012 Cath: LM 30-40, LAD 70p, 70/42m, D1 80-90p, OM1 small 90p, OM2 large 80-90p, 56m, 70-80d, RCA 20-30 diff, EF 40%, glob HK.s/p CABG  . Ischemic cardiomyopathy     a. 07/2012 Echo: EF 35%, Sev inferoseptal HK, mildly dil LA, Peak PASP 2mmHg.  Marland Kitchen PAF (paroxysmal atrial fibrillation) ([place])     a. 07/2012: Amio and xarelto initiated.  Marland Kitchen LBBB (left bundle branch block)     a. intermittent - present during rapid afib 07/2012.  Marland Kitchen SBO (small bowel obstruction) ([place])   . AICD (automatic cardioverter/defibrillator) present 01/2013    Biventricular cardiac pacemaker in situ   . Exertional shortness of breath   . Diabetes mellitus type II   . Neuromuscular disorder ([place])     Patient reports chronic numbness in the right foot related to previous surgery on the right leg and "nerve damage"  . Cancer of left breast [facility]) 2002    Patient reports left breast cancer diagnosis in 2002 treated with bilateral mastectomy positive lymph nodes with left axillary dissection followed by chemotherapy of unknown type    Past Surgical History  Procedure Laterality Date  . Incontinence  surgery  2000  . Tendon repair Right 2001 X 3-4    torn ligaments and tendons in ankle up to knee from work related accident  . Appendectomy  1974  . Coronary artery bypass graft N/A 09/28/2012    Procedure: CORONARY ARTERY BYPASS GRAFTING (CABG);  Surgeon: [name], MD;  Location: [place];  Service: Open Heart Surgery;  Laterality: N/A;  CABG x four, using left internal mammary artery and left leg greater saphenous vein harvested endoscopically  . Maze N/A 09/28/2012    Procedure: MAZE;  Surgeon: [name], MD;  Location: [place];  Service: Open Heart Surgery;  Laterality: N/A;  . Intraoperative transesophageal echocardiogram N/A 09/28/2012    Procedure: INTRAOPERATIVE TRANSESOPHAGEAL ECHOCARDIOGRAM;  Surgeon: [name], MD;  Location: [place];  Service: Open Heart Surgery;  Laterality: N/A;  . Epicardial pacing lead placement N/A 09/28/2012    Procedure: EPICARDIAL PACING LEAD PLACEMENT;  Surgeon: [name], MD;  Location: [place];  Service: Thoracic;  Laterality: N/A;  LV LEAD PLACEMENT  . Left heart catheterization with coronary angiogram N/A 07/20/2012    Procedure: LEFT HEART CATHETERIZATION WITH CORONARY ANGIOGRAM;  Surgeon: [name], MD;  Location: [facility] CATH LAB;  Service: Cardiovascular;  Laterality: N/A;  . Bi-ventricular pacemaker insertion N/A 01/25/2013    Procedure: BI-VENTRICULAR PACEMAKER INSERTION (CRT-P);  Surgeon: Evans Lance, MD;  Location: [facility] CATH  LAB;  Service: Cardiovascular;  Laterality: N/A;  . Laparoscopic incisional / umbilical / ventral hernia repair  05/25/2015    IHR  . Mastectomy Right 2002  . Mastectomy modified radical w/ axillary lymph nodes w/ or w/o pectoralis minor Left 2002  . Cholecystectomy open  1974  . Hernia repair    . Cardiac catheterization    . Abdominal hysterectomy  2000  . Dilation and curettage of uterus  1983  . Tubal ligation  1987  . Breast biopsy Left 2002  . Incisional hernia repair N/A 05/25/2015     Procedure: LAPAROSCOPIC INCISIONAL HERNIA WITH MESH ;  Surgeon: [name], MD;  Location: [place];  Service: General;  Laterality: N/A;  . Insertion of mesh N/A 05/25/2015    Procedure: INSERTION OF MESH;  Surgeon: [name], MD;  Location: [place];  Service: General;  Laterality: N/A;    There were no vitals filed for this visit.       Subjective Assessment - 09/13/15 1450    Subjective Pt presents to PT with complaints of widespread pain with worst pain being in bil. shoulders (Rt>Lt) and low back.  Pt reports that she is limited with reaching overhead due to limited AROM. Pt reports a 2 year history of pain that began after heart surgery.     Pertinent History history of breast cancer, pacemaker   Diagnostic tests Lumbar CT will be scheduled soon.  No current imaging   Patient Stated Goals reduce shoulder pain, reduce low back pain, improve shoulder AROM   Currently in Pain? Yes   Pain Score 9    Pain Location Shoulder   Pain Orientation Right;Left   Pain Descriptors / Indicators Aching;Pins and needles   Pain Type Chronic pain   Pain Onset More than a month ago   Pain Frequency Constant   Aggravating Factors  reaching with arms overhead, making the bed, lifting, use of arms   Pain Relieving Factors nothing   Multiple Pain Sites Yes   Pain Score 6   Pain Location Back   Pain Orientation Right;Left;Lower   Pain Descriptors / Indicators Throbbing;Tightness   Pain Type Chronic pain   Pain Onset More than a month ago   Pain Frequency Constant   Aggravating Factors  walking up/down steps, prolonged sitting   Pain Relieving Factors change of position            [facility] PT Assessment - 09/13/15 0001    Assessment   Medical Diagnosis generalized osteoarthritis, involving multiple sites (M15.9)   Referring Provider Martinique, Betty, MD   Onset Date/Surgical Date 09/12/13   Hand Dominance Right   Next MD Visit 09/2015   Precautions   Precautions Other (comment)   history of cancer- no [place]   Precaution Comments history of breast cancer, pt has a pacemaker   Restrictions   Weight Bearing Restrictions No   Balance Screen   Has the patient fallen in the past 6 months No   Has the patient had a decrease in activity level because of a fear of falling?  No   Is the patient reluctant to leave their home because of a fear of falling?  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)   Cognition   Overall Cognitive Status Within Functional Limits for tasks assessed   Observation/Other Assessments   Focus on Therapeutic Outcomes (FOTO)  49% limitation   Posture/Postural Control   Posture/Postural Control Postural limitations   Postural Limitations Rounded Shoulders;Forward head   ROM / Strength   AROM / PROM / Strength AROM;PROM;Strength   AROM   Overall AROM  Deficits   Overall AROM Comments Lumbar AROM is full   AROM Assessment Site Shoulder   Right/Left Shoulder Right;Left   Right Shoulder Flexion 89 Degrees   Right Shoulder ABduction 70 Degrees   Right Shoulder Internal Rotation --  to sacrum   Right Shoulder External Rotation --  to Rt UT   Left Shoulder Flexion 117 Degrees   Left Shoulder ABduction 111 Degrees   Left Shoulder Internal Rotation --  to T12   Left Shoulder External Rotation --  to T2   PROM   Overall PROM  Deficits   PROM Assessment Site Shoulder   Right/Left Shoulder Right;Left   Right Shoulder Flexion 110 Degrees   Right Shoulder ABduction 89 Degrees   Right Shoulder Internal Rotation 30 Degrees   Right Shoulder External Rotation 35 Degrees   Strength   Overall Strength  Deficits   Overall Strength Comments shoulder strength tested in neutral 4/5 except Rt IR 4-/5, Bil LEs 4 to 4+/5 throughout   Palpation   Spinal mobility no pain with PA mobility.  Limited by 25% in the lumbar spine   Palpation comment No palpable tenderness over the lumbar spine.  Pt with palpable tenderness over Rt lateral deltoid.  No pain at the glenohumeral joint.  Reduced inferior glide and posterior glide of the Rt glenohumeral joint.     Ambulation/Gait   Ambulation/Gait Yes   Ambulation/Gait Assistance 7: Independent   Ambulation Distance (Feet) 100 Feet   Gait Pattern Within Functional Limits                           PT Education - 09/13/15 1520    Education provided Yes   Education Details single knee to chest, low trunk rotation, seated hamstring stretch, AAROM flexion of shoulders   Person(s) Educated Patient   Methods Explanation;Demonstration;Handout   Comprehension Verbalized understanding;Returned demonstration          PT Short Term Goals - 09/13/15 1541    PT SHORT TERM GOAL #1   Title be independent in initial HEP   Time 4   Period Weeks   Status New   PT SHORT TERM GOAL #2   Title demonstrate Rt shoulder AROM to > or = to 105 degrees to improve reaching overhead   Time 4   Period Weeks   Status New   PT SHORT TERM GOAL #3   Title report a 25% reduction in shoulder pain with reaching overhead   Time 4   Period Weeks   Status New   PT SHORT TERM GOAL #4   Title verbalize and demonstrate correct body mechanics with sitting, lifting and housework    Time 4   Period Weeks   Status New   PT SHORT TERM GOAL #5   Title report a 30% reduction in LBP with sitting at work   Time 4   Period Weeks   Status New  PT Long Term Goals - 09/13/15 1443    PT LONG TERM GOAL #1   Title be independent in advanced HEP   Time 8   Period Weeks   Status New   PT LONG TERM GOAL #2   Title reduce FOTO to < or = to 36% limitation    Time 8   Period Weeks   Status New   PT LONG TERM GOAL #3   Title demonstrate Rt shoulder AROM flexion to > or = to 125 degrees to improve overhead reaching   Time 8   Period Weeks   Status New   PT LONG TERM GOAL #4   Title report a 60% reduction in shoulder pain with use overhead and with ADLs and self-care   Time 8   Period Weeks   Status New   PT LONG TERM GOAL #5   Title report a 60% reduction in LBP with sitting at work   Time 8   Period Weeks   Status New   Additional Long Term Goals   Additional Long Term Goals Yes   PT LONG TERM GOAL #6   Title demonstrate Rt shoulder IR to L2 to improve use with self-care   Time 8   Period Weeks   Status New               Plan - 09/13/15 1537    Clinical Impression Statement Pt presents to PT with 2 year history of Rt>Lt shoulder pain and limited shoulder AROM and LBP that began after having open heart surgery.  Pt presents with limited and painful AROM/PROM of bil shoulders and painful lumbar mobiltiy.  Pt also demonstrates weakness in bil. shoulders and LEs.  Pt sits for work and reports that she has LBP with sitting.  Pt is not able to reach overhead due to shoulder pain.  Pt will benefit from skilled PT for shoulder AROM and strength progression, manual, core strength and heat/ice for pain management.     Rehab Potential Good   PT Frequency 2x / week   PT Duration 8 weeks   PT Treatment/Interventions ADLs/Self Care Home Management;Cryotherapy;Moist Heat;Therapeutic exercise;Therapeutic activities;Neuromuscular re-education;Patient/family education;Manual techniques;Taping;Dry needling;Passive range of motion   PT Next Visit Plan Shoulder AAROM, gentle strength, hip and lumbar flexibility, core strength, body mechanics education   Consulted and Agree with Plan of Care Patient      Patient will benefit from skilled therapeutic intervention in order to improve the following deficits and impairments:  Postural dysfunction,  Impaired flexibility, Decreased strength, Decreased activity tolerance, Pain, Decreased range of motion  Visit Diagnosis: Pain in left shoulder - Plan: PT plan of care cert/re-cert  Pain in right shoulder - Plan: PT plan of care cert/re-cert  Bilateral low back pain without sciatica - Plan: PT plan of care cert/re-cert  Shoulder stiffness, left - Plan: PT plan of care cert/re-cert  Stiffness of right shoulder, not elsewhere classified - Plan: PT plan of care cert/re-cert  Muscle weakness (generalized) - Plan: PT plan of care cert/re-cert     Problem List Patient Active Problem List   Diagnosis Date Noted  . Generalized osteoarthrosis, involving multiple sites 08/25/2015  . Other fatigue 08/11/2015  . Anemia, unspecified 08/11/2015  . Back pain, chronic 08/11/2015  . Incisional hernia 05/25/2015  . Incisional hernia, without obstruction or gangrene 05/04/2015  . Exertional dyspnea 08/05/2014  . Routine general medical examination at a health care facility 04/14/2014  . Anemia 05/13/2013  . Anxiety state, unspecified  05/13/2013  . Acute on chronic combined systolic and diastolic CHF, NYHA class 3 ([place]) 03/19/2013  . High anion gap metabolic acidosis 123456  . Nausea & vomiting 03/14/2013  . SBO (small bowel obstruction) ([place]) 03/14/2013  . Biventricular cardiac pacemaker - St Jude, Nov 2014 03/04/2013  . Chronic combined systolic and diastolic CHF, NYHA class 2 ([place]) 01/08/2013  . S/P CABG x 4: 09/28/12 (LIMA-LAD, SVG-OM1-OM2, SVG-Intermediate) 09/29/2012  . Unstable angina ([place]) 08/31/2012  . Right carotid bruit 08/11/2012  . PAF (paroxysmal atrial fibrillation) ([place]) 07/21/2012  . Ischemic cardiomyopathy   . CAD (coronary artery disease)   . Anogenital warts 01/22/2011  . Type 2 diabetes mellitus, uncontrolled ([place]) 09/22/2009  . Hyperlipidemia 09/19/2009  . Essential hypertension 09/19/2009  . MYOCARDIAL INFARCTION, HX OF 09/19/2009  . Allergic rhinitis 09/19/2009   . BREAST CANCER, HX OF 09/19/2009     Sigurd Sos, PT 09/13/2015 3:48 PM  [facility] [address] [place], [place], [address] Phone: [phone]   Fax:  [phone]  Name: Hela Brueggeman MRN: [id number] Date of Birth: 1950-08-21

## 2015-09-15 DIAGNOSIS — E785 Hyperlipidemia, unspecified: Secondary | ICD-10-CM | POA: Diagnosis not present

## 2015-09-16 LAB — HEPATIC FUNCTION PANEL
ALBUMIN: 4.3 g/dL (ref 3.6–5.1)
ALT: 18 U/L (ref 6–29)
AST: 14 U/L (ref 10–35)
Alkaline Phosphatase: 61 U/L (ref 33–130)
BILIRUBIN TOTAL: 0.5 mg/dL (ref 0.2–1.2)
Bilirubin, Direct: 0.1 mg/dL (ref <=0.2)
Indirect Bilirubin: 0.4 mg/dL (ref 0.2–1.2)
TOTAL PROTEIN: 7.3 g/dL (ref 6.1–8.1)

## 2015-09-16 LAB — LIPID PANEL
CHOL/HDL RATIO: 3.5 ratio (ref <=5.0)
Cholesterol: 195 mg/dL (ref 125–200)
HDL: 55 mg/dL (ref >=46)
LDL CALC: 97 mg/dL (ref <130)
Triglycerides: 214 mg/dL — ABNORMAL HIGH (ref <150)
VLDL: 43 mg/dL — ABNORMAL HIGH (ref <30)

## 2015-09-17 ENCOUNTER — Other Ambulatory Visit: Payer: Self-pay | Admitting: Family Medicine

## 2015-09-17 DIAGNOSIS — M549 Dorsalgia, unspecified: Secondary | ICD-10-CM

## 2015-09-19 ENCOUNTER — Encounter: Payer: BLUE CROSS/BLUE SHIELD | Admitting: Physical Therapy

## 2015-09-20 ENCOUNTER — Encounter: Payer: Self-pay | Admitting: Cardiovascular Disease

## 2015-09-20 NOTE — Telephone Encounter (Signed)
Called patient with results and informed that we have started process for [place] coverage through insurance. We will call once we have received approval or denial. She should be eligible for copay card if approved. Which will reduce price to $5/month.

## 2015-09-20 NOTE — Telephone Encounter (Signed)
Called patient about results and to inform we have started process to have insurance cover [place]. Information sent to insurance company today. Will call her when he have received approval.

## 2015-09-21 ENCOUNTER — Encounter: Payer: BLUE CROSS/BLUE SHIELD | Admitting: Physical Therapy

## 2015-09-26 ENCOUNTER — Ambulatory Visit: Payer: BLUE CROSS/BLUE SHIELD | Admitting: Physical Therapy

## 2015-09-26 DIAGNOSIS — M6281 Muscle weakness (generalized): Secondary | ICD-10-CM | POA: Diagnosis not present

## 2015-09-26 DIAGNOSIS — M25611 Stiffness of right shoulder, not elsewhere classified: Secondary | ICD-10-CM | POA: Diagnosis not present

## 2015-09-26 DIAGNOSIS — M25511 Pain in right shoulder: Secondary | ICD-10-CM

## 2015-09-26 DIAGNOSIS — M25512 Pain in left shoulder: Secondary | ICD-10-CM | POA: Diagnosis not present

## 2015-09-26 DIAGNOSIS — M545 Low back pain: Secondary | ICD-10-CM | POA: Diagnosis not present

## 2015-09-26 DIAGNOSIS — M25612 Stiffness of left shoulder, not elsewhere classified: Secondary | ICD-10-CM | POA: Diagnosis not present

## 2015-09-26 NOTE — Patient Instructions (Signed)
Stacy Simpson [facility] [address], [address] [place], [place] [address] Phone # [phone] Fax [phone]    

## 2015-09-26 NOTE — Therapy (Signed)
[facility] [address], [place], [place], [address] Phone: [phone]   Fax:  [phone]  Physical Therapy Treatment  Patient Details  Name: [name] MRN: [id number] Date of Birth: April 27, 1950 Referring Provider: Martinique, Betty, MD  Encounter Date: 09/26/2015      PT End of Session - 09/26/15 1431    Visit Number 2   Date for PT Re-Evaluation 11/08/15   PT Start Time 1400   PT Stop Time 1440   PT Time Calculation (min) 40 min   Activity Tolerance Patient tolerated treatment well      Past Medical History  Diagnosis Date  . Hyperlipidemia   . Hypertension   . Allergic rhinitis   . Cataract   . CAD (coronary artery disease)     a. 2004: s/p MI in [place]. No PCI->Medical RX;  b. 07/2012 Cath: LM 30-40, LAD 70p, 70/38m, D1 80-90p, OM1 small 90p, OM2 large 80-90p, 17m, 70-80d, RCA 20-30 diff, EF 40%, glob HK.s/p CABG  . Ischemic cardiomyopathy     a. 07/2012 Echo: EF 35%, Sev inferoseptal HK, mildly dil LA, Peak PASP 58mmHg.  Marland Kitchen PAF (paroxysmal atrial fibrillation) ([place])     a. 07/2012: Amio and xarelto initiated.  Marland Kitchen LBBB (left bundle branch block)     a. intermittent - present during rapid afib 07/2012.  Marland Kitchen SBO (small bowel obstruction) ([place])   . AICD (automatic cardioverter/defibrillator) present 01/2013    Biventricular cardiac pacemaker in situ   . Exertional shortness of breath   . Diabetes mellitus type II   . Neuromuscular disorder ([place])     Patient reports chronic numbness in the right foot related to previous surgery on the right leg and "nerve damage"  . Cancer of left breast [facility]) 2002    Patient reports left breast cancer diagnosis in 2002 treated with bilateral mastectomy positive lymph nodes with left axillary dissection followed by chemotherapy of unknown type    Past Surgical History  Procedure Laterality Date  . Incontinence surgery  2000  . Tendon repair Right 2001 X 3-4    torn  ligaments and tendons in ankle up to knee from work related accident  . Appendectomy  1974  . Coronary artery bypass graft N/A 09/28/2012    Procedure: CORONARY ARTERY BYPASS GRAFTING (CABG);  Surgeon: [name], MD;  Location: [place];  Service: Open Heart Surgery;  Laterality: N/A;  CABG x four, using left internal mammary artery and left leg greater saphenous vein harvested endoscopically  . Maze N/A 09/28/2012    Procedure: MAZE;  Surgeon: [name], MD;  Location: [place];  Service: Open Heart Surgery;  Laterality: N/A;  . Intraoperative transesophageal echocardiogram N/A 09/28/2012    Procedure: INTRAOPERATIVE TRANSESOPHAGEAL ECHOCARDIOGRAM;  Surgeon: [name], MD;  Location: [place];  Service: Open Heart Surgery;  Laterality: N/A;  . Epicardial pacing lead placement N/A 09/28/2012    Procedure: EPICARDIAL PACING LEAD PLACEMENT;  Surgeon: [name], MD;  Location: [place];  Service: Thoracic;  Laterality: N/A;  LV LEAD PLACEMENT  . Left heart catheterization with coronary angiogram N/A 07/20/2012    Procedure: LEFT HEART CATHETERIZATION WITH CORONARY ANGIOGRAM;  Surgeon: [name], MD;  Location: [facility] CATH LAB;  Service: Cardiovascular;  Laterality: N/A;  . Bi-ventricular pacemaker insertion N/A 01/25/2013    Procedure: BI-VENTRICULAR PACEMAKER INSERTION (CRT-P);  Surgeon: Evans Lance, MD;  Location: [facility] CATH LAB;  Service: Cardiovascular;  Laterality: N/A;  .  Laparoscopic incisional / umbilical / ventral hernia repair  05/25/2015    IHR  . Mastectomy Right 2002  . Mastectomy modified radical w/ axillary lymph nodes w/ or w/o pectoralis minor Left 2002  . Cholecystectomy open  1974  . Hernia repair    . Cardiac catheterization    . Abdominal hysterectomy  2000  . Dilation and curettage of uterus  1983  . Tubal ligation  1987  . Breast biopsy Left 2002  . Incisional hernia repair N/A 05/25/2015    Procedure: LAPAROSCOPIC INCISIONAL HERNIA WITH MESH ;  Surgeon:  [name], MD;  Location: [place];  Service: General;  Laterality: N/A;  . Insertion of mesh N/A 05/25/2015    Procedure: INSERTION OF MESH;  Surgeon: [name], MD;  Location: [place];  Service: General;  Laterality: N/A;    There were no vitals filed for this visit.      Subjective Assessment - 09/26/15 1404    Subjective Shoulder bother me the most.  Less often having low back pain- lumbar rotation helping.  Shoulder ex's going OK.  Superior shoulder pain radiating in upper arm.     Pertinent History history of breast cancer, pacemaker   Currently in Pain? Yes   Pain Score 8    Pain Location Shoulder   Pain Orientation Right   Pain Type Chronic pain                         [facility] Adult PT Treatment/Exercise - 09/26/15 0001    Shoulder Exercises: Supine   External Rotation AAROM;Both;10 reps  cane assist   Flexion AAROM;Right;Left;10 reps  cane assist   Shoulder Exercises: Standing   Extension Strengthening;Right;Left;10 reps;Theraband   Theraband Level (Shoulder Extension) Level 1 (Yellow)   Row Right;Left;10 reps;Theraband   Theraband Level (Shoulder Row) Level 1 (Yellow)   Other Standing Exercises AAROM red ball on treatment table 20x right/left  just below and at shoulder level   Other Standing Exercises yellow band tricep extensions 25x   Manual Therapy   Manual Therapy Joint mobilization   Joint Mobilization GH distraction, inferior, posterior grade 3 at early, mid and with movement  right and left                PT Education - 09/26/15 1851    Education provided Yes   Education Details red band rows, extensions and triceps    Person(s) Educated Patient   Methods Explanation;Demonstration;Handout   Comprehension Verbalized understanding;Returned demonstration          PT Short Term Goals - 09/26/15 1436    PT SHORT TERM GOAL #1   Title be independent in initial HEP   Time 4   Period Weeks   Status On-going   PT SHORT  TERM GOAL #2   Title demonstrate Rt shoulder AROM to > or = to 105 degrees to improve reaching overhead   Time 4   Period Weeks   Status On-going   PT SHORT TERM GOAL #3   Title report a 25% reduction in shoulder pain with reaching overhead   Time 4   Period Weeks   Status On-going   PT SHORT TERM GOAL #4   Title verbalize and demonstrate correct body mechanics with sitting, lifting and housework    Time 4   Period Weeks   Status On-going   PT SHORT TERM GOAL #5   Title report a 30% reduction in LBP with sitting at  work   Time 4   Period Weeks   Status On-going           PT Long Term Goals - 09/26/15 1856    PT LONG TERM GOAL #1   Title be independent in advanced HEP   Time 8   Period Weeks   Status On-going   PT LONG TERM GOAL #2   Title reduce FOTO to < or = to 36% limitation   Time 8   Period Weeks   Status On-going   PT LONG TERM GOAL #3   Title demonstrate Rt shoulder AROM flexion to > or = to 125 degrees to improve overhead reaching   Time 8   Period Weeks   Status On-going   PT LONG TERM GOAL #4   Title report a 60% reduction in shoulder pain with use overhead and with ADLs and self-care   Time 8   Period Weeks   Status On-going   PT LONG TERM GOAL #5   Title report a 60% reduction in LBP with sitting at work   Time 8   Period Weeks   Status On-going   PT LONG TERM GOAL #6   Title demonstrate Rt shoulder IR to L2 to improve use with self-care   Time 8   Period Weeks   Status On-going               Plan - 09/26/15 1431    Clinical Impression Statement The patient reports her primary complaint is right shoulder pain.  States her back pain is much better since doing stretches given last visit.   Glenohumeral hypomobility.  Improved shoulder ROM with gravity assisting in supine.  No exacerbation of pain with red band scapular resistance ex's.  Therapist closely monitoring response with all.  She declines the need for heat/ice.     PT Next Visit  Plan Shoulder AAROM, gentle strength scapular muscles, deltoid and rotator cuff, hip and lumbar flexibility, core strength, body mechanics education;  NO ESTIM pacemaker      Patient will benefit from skilled therapeutic intervention in order to improve the following deficits and impairments:     Visit Diagnosis: Pain in left shoulder  Pain in right shoulder  Stiffness of right shoulder, not elsewhere classified  Muscle weakness (generalized)     Problem List Patient Active Problem List   Diagnosis Date Noted  . Generalized osteoarthrosis, involving multiple sites 08/25/2015  . Other fatigue 08/11/2015  . Anemia, unspecified 08/11/2015  . Back pain, chronic 08/11/2015  . Incisional hernia 05/25/2015  . Incisional hernia, without obstruction or gangrene 05/04/2015  . Exertional dyspnea 08/05/2014  . Routine general medical examination at a health care facility 04/14/2014  . Anemia 05/13/2013  . Anxiety state, unspecified 05/13/2013  . Acute on chronic combined systolic and diastolic CHF, NYHA class 3 ([place]) 03/19/2013  . High anion gap metabolic acidosis 123456  . Nausea & vomiting 03/14/2013  . SBO (small bowel obstruction) ([place]) 03/14/2013  . Biventricular cardiac pacemaker - St Jude, Nov 2014 03/04/2013  . Chronic combined systolic and diastolic CHF, NYHA class 2 ([place]) 01/08/2013  . S/P CABG x 4: 09/28/12 (LIMA-LAD, SVG-OM1-OM2, SVG-Intermediate) 09/29/2012  . Unstable angina ([place]) 08/31/2012  . Right carotid bruit 08/11/2012  . PAF (paroxysmal atrial fibrillation) ([place]) 07/21/2012  . Ischemic cardiomyopathy   . CAD (coronary artery disease)   . Anogenital warts 01/22/2011  . Type 2 diabetes mellitus, uncontrolled ([place]) 09/22/2009  . Hyperlipidemia 09/19/2009  . Essential  hypertension 09/19/2009  . MYOCARDIAL INFARCTION, HX OF 09/19/2009  . Allergic rhinitis 09/19/2009  . BREAST CANCER, HX OF 09/19/2009   Ruben Im, PT 09/26/2015 6:58 PM Phone:  [phone] Fax: [phone]  Alvera Singh 09/26/2015, 6:58 PM  [facility] [address] [place], [place], [address] Phone: [phone]   Fax:  [phone]  Name: [name] MRN: [id number] Date of Birth: 1950-07-14

## 2015-09-27 DIAGNOSIS — M7581 Other shoulder lesions, right shoulder: Secondary | ICD-10-CM | POA: Diagnosis not present

## 2015-09-27 DIAGNOSIS — M5030 Other cervical disc degeneration, unspecified cervical region: Secondary | ICD-10-CM | POA: Diagnosis not present

## 2015-09-27 DIAGNOSIS — M5136 Other intervertebral disc degeneration, lumbar region: Secondary | ICD-10-CM | POA: Diagnosis not present

## 2015-09-27 DIAGNOSIS — M7582 Other shoulder lesions, left shoulder: Secondary | ICD-10-CM | POA: Diagnosis not present

## 2015-09-28 ENCOUNTER — Other Ambulatory Visit: Payer: Self-pay | Admitting: Pharmacist

## 2015-09-28 ENCOUNTER — Ambulatory Visit: Payer: BLUE CROSS/BLUE SHIELD | Admitting: Physical Therapy

## 2015-09-28 DIAGNOSIS — M25511 Pain in right shoulder: Secondary | ICD-10-CM

## 2015-09-28 DIAGNOSIS — M6281 Muscle weakness (generalized): Secondary | ICD-10-CM | POA: Diagnosis not present

## 2015-09-28 DIAGNOSIS — M25512 Pain in left shoulder: Secondary | ICD-10-CM | POA: Diagnosis not present

## 2015-09-28 DIAGNOSIS — M545 Low back pain: Secondary | ICD-10-CM | POA: Diagnosis not present

## 2015-09-28 DIAGNOSIS — M25611 Stiffness of right shoulder, not elsewhere classified: Secondary | ICD-10-CM

## 2015-09-28 DIAGNOSIS — M25612 Stiffness of left shoulder, not elsewhere classified: Secondary | ICD-10-CM | POA: Diagnosis not present

## 2015-09-28 MED ORDER — EVOLOCUMAB 140 MG/ML ~~LOC~~ SOAJ: 140.0000 mg | SUBCUTANEOUS | Status: DC

## 2015-09-28 NOTE — Patient Instructions (Signed)
Strengthening: Resisted Internal Rotation   Hold tubing in left hand, elbow at side and forearm out. Rotate forearm in across body. Repeat __10__ times per set. Do __2__ sets per session. Do ___1_ sessions per day.  http://orth.exer.us/830   Copyright  VHI. All rights reserved.  Strengthening: Resisted External Rotation   Hold tubing in right hand, elbow at side and forearm across body. Rotate forearm out. Repeat __10__ times per set. Do _2___ sets per session. Do __1__ sessions per day.  http://orth.exer.us/828   Copyright  VHI. All rights reserved.  Strengthening: Resisted Flexion   Hold tubing with left arm at side. Pull forward and up. Move shoulder through pain-free range of motion. Repeat __10__ times per set. Do __2__ sets per session. Do __1__ sessions per day.  http://orth.exer.us/824   Copyright  VHI. All rights reserved.  Strengthening: Resisted Extension   Hold tubing in right hand, arm forward. Pull arm back, elbow straight. Repeat __10__ times per set. Do __2__ sets per session. Do _1_ sessions per day.  http://orth.exer.us/832   Copyright  VHI. All rights reserved.   Ruben Im PT [facility] [address] [place], [place] [address] Phone # [phone] Fax [phone]

## 2015-09-28 NOTE — Therapy (Signed)
[facility] [address] [place], [place], [address] Phone: [phone]   Fax:  [phone]  Physical Therapy Treatment  Patient Details  Name: [name] MRN: [id number] Date of Birth: 26-Dec-1950 Referring Provider: Martinique, Betty, MD  Encounter Date: 09/28/2015      PT End of Session - 09/28/15 1425    Visit Number 3   Date for PT Re-Evaluation 11/08/15   PT Start Time 1400   PT Stop Time 1438   PT Time Calculation (min) 38 min   Activity Tolerance Patient tolerated treatment well      Past Medical History  Diagnosis Date  . Hyperlipidemia   . Hypertension   . Allergic rhinitis   . Cataract   . CAD (coronary artery disease)     a. 2004: s/p MI in [place]. No PCI->Medical RX;  b. 07/2012 Cath: LM 30-40, LAD 70p, 70/32m, D1 80-90p, OM1 small 90p, OM2 large 80-90p, 78m, 70-80d, RCA 20-30 diff, EF 40%, glob HK.s/p CABG  . Ischemic cardiomyopathy     a. 07/2012 Echo: EF 35%, Sev inferoseptal HK, mildly dil LA, Peak PASP 3mmHg.  Marland Kitchen PAF (paroxysmal atrial fibrillation) ([place])     a. 07/2012: Amio and xarelto initiated.  Marland Kitchen LBBB (left bundle branch block)     a. intermittent - present during rapid afib 07/2012.  Marland Kitchen SBO (small bowel obstruction) ([place])   . AICD (automatic cardioverter/defibrillator) present 01/2013    Biventricular cardiac pacemaker in situ   . Exertional shortness of breath   . Diabetes mellitus type II   . Neuromuscular disorder ([place])     Patient reports chronic numbness in the right foot related to previous surgery on the right leg and "nerve damage"  . Cancer of left breast [facility]) 2002    Patient reports left breast cancer diagnosis in 2002 treated with bilateral mastectomy positive lymph nodes with left axillary dissection followed by chemotherapy of unknown type    Past Surgical History  Procedure Laterality Date  . Incontinence surgery  2000  . Tendon repair Right 2001 X 3-4    torn  ligaments and tendons in ankle up to knee from work related accident  . Appendectomy  1974  . Coronary artery bypass graft N/A 09/28/2012    Procedure: CORONARY ARTERY BYPASS GRAFTING (CABG);  Surgeon: [name], MD;  Location: [place];  Service: Open Heart Surgery;  Laterality: N/A;  CABG x four, using left internal mammary artery and left leg greater saphenous vein harvested endoscopically  . Maze N/A 09/28/2012    Procedure: MAZE;  Surgeon: [name], MD;  Location: [place];  Service: Open Heart Surgery;  Laterality: N/A;  . Intraoperative transesophageal echocardiogram N/A 09/28/2012    Procedure: INTRAOPERATIVE TRANSESOPHAGEAL ECHOCARDIOGRAM;  Surgeon: [name], MD;  Location: [place];  Service: Open Heart Surgery;  Laterality: N/A;  . Epicardial pacing lead placement N/A 09/28/2012    Procedure: EPICARDIAL PACING LEAD PLACEMENT;  Surgeon: [name], MD;  Location: [place];  Service: Thoracic;  Laterality: N/A;  LV LEAD PLACEMENT  . Left heart catheterization with coronary angiogram N/A 07/20/2012    Procedure: LEFT HEART CATHETERIZATION WITH CORONARY ANGIOGRAM;  Surgeon: [name], MD;  Location: [facility] CATH LAB;  Service: Cardiovascular;  Laterality: N/A;  . Bi-ventricular pacemaker insertion N/A 01/25/2013    Procedure: BI-VENTRICULAR PACEMAKER INSERTION (CRT-P);  Surgeon: Evans Lance, MD;  Location: [facility] CATH LAB;  Service: Cardiovascular;  Laterality: N/A;  .  Laparoscopic incisional / umbilical / ventral hernia repair  05/25/2015    IHR  . Mastectomy Right 2002  . Mastectomy modified radical w/ axillary lymph nodes w/ or w/o pectoralis minor Left 2002  . Cholecystectomy open  1974  . Hernia repair    . Cardiac catheterization    . Abdominal hysterectomy  2000  . Dilation and curettage of uterus  1983  . Tubal ligation  1987  . Breast biopsy Left 2002  . Incisional hernia repair N/A 05/25/2015    Procedure: LAPAROSCOPIC INCISIONAL HERNIA WITH MESH ;  Surgeon:  [name], MD;  Location: [place];  Service: General;  Laterality: N/A;  . Insertion of mesh N/A 05/25/2015    Procedure: INSERTION OF MESH;  Surgeon: [name], MD;  Location: [place];  Service: General;  Laterality: N/A;    There were no vitals filed for this visit.      Subjective Assessment - 09/28/15 1401    Subjective Yesterday had a cortisone shot yesterday in right shoulder.  The doctor said "my problem was old age."  Sore but not excessively so after last visit.     Currently in Pain? Yes   Pain Score 6    Pain Location Shoulder   Pain Orientation Right   Pain Type Chronic pain                         [facility] Adult PT Treatment/Exercise - 09/28/15 0001    Shoulder Exercises: Supine   Protraction Right;Left;15 reps   Shoulder Exercises: Standing   Flexion AAROM;Right;Left;15 reps  ball on incline   Extension Strengthening;Right;Left;10 reps;Theraband   Theraband Level (Shoulder Extension) Level 2 (Red)   Row Right;Left;10 reps;Theraband   Theraband Level (Shoulder Row) Level 2 (Red)   Other Standing Exercises Modified [place] series no abduction 10-15 x each red band   Manual Therapy   Joint Mobilization GH distraction, inferior, posterior grade 3 at early, mid and with movement  right and left                PT Education - 09/28/15 1422    Education provided Yes   Education Details [place] red band   Person(s) Educated Patient   Methods Explanation;Demonstration;Handout   Comprehension Verbalized understanding;Returned demonstration          PT Short Term Goals - 09/28/15 1431    PT SHORT TERM GOAL #1   Title be independent in initial HEP   Time 4   Period Weeks   Status On-going   PT SHORT TERM GOAL #2   Title demonstrate Rt shoulder AROM to > or = to 105 degrees to improve reaching overhead   Time 4   Period Weeks   Status On-going   PT SHORT TERM GOAL #3   Title report a 25% reduction in shoulder pain with  reaching overhead   Time 4   Period Weeks   Status On-going   PT SHORT TERM GOAL #4   Title verbalize and demonstrate correct body mechanics with sitting, lifting and housework    Time 4   Period Weeks   Status On-going   PT SHORT TERM GOAL #5   Title report a 30% reduction in LBP with sitting at work   Time 4   Period Weeks   Status On-going           PT Long Term Goals - 09/28/15 1432    PT LONG TERM GOAL #1  Title be independent in advanced HEP   Time 8   Period Weeks   Status On-going   PT LONG TERM GOAL #2   Title reduce FOTO to < or = to 36% limitation   Time 8   Period Weeks   Status On-going   PT LONG TERM GOAL #3   Title demonstrate Rt shoulder AROM flexion to > or = to 125 degrees to improve overhead reaching   Time 8   Period Weeks   Status On-going   PT LONG TERM GOAL #4   Title report a 60% reduction in shoulder pain with use overhead and with ADLs and self-care   Time 8   Period Weeks   Status On-going   PT LONG TERM GOAL #5   Title report a 60% reduction in LBP with sitting at work   Time 8   Period Weeks   Status On-going   PT LONG TERM GOAL #6   Title demonstrate Rt shoulder IR to L2 to improve use with self-care   Time 8   Period Weeks   Status On-going               Plan - 09/28/15 1426    Clinical Impression Statement The patient is able to perform a progession of shoulder and scapular strengthening ex's without exacerbation of pain.  Mild increase in right shoulder flexion toward endrange so modified to avoid this range.  Verbal and tactile cues to decrease compensatory shoulder shrug especially on right.  Patient is highly motivated with return to activity.  Treatment focus on shoulder last 2 visits secondary to this being her primary complaint.   No hip or back complaints.     PT Next Visit Plan Shoulder AAROM, gentle strength scapular muscles, deltoid and rotator cuff, hip and lumbar flexibility, core strength, body mechanics  education;  NO ESTIM pacemaker      Patient will benefit from skilled therapeutic intervention in order to improve the following deficits and impairments:     Visit Diagnosis: Pain in left shoulder  Pain in right shoulder  Stiffness of right shoulder, not elsewhere classified  Muscle weakness (generalized)     Problem List Patient Active Problem List   Diagnosis Date Noted  . Generalized osteoarthrosis, involving multiple sites 08/25/2015  . Other fatigue 08/11/2015  . Anemia, unspecified 08/11/2015  . Back pain, chronic 08/11/2015  . Incisional hernia 05/25/2015  . Incisional hernia, without obstruction or gangrene 05/04/2015  . Exertional dyspnea 08/05/2014  . Routine general medical examination at a health care facility 04/14/2014  . Anemia 05/13/2013  . Anxiety state, unspecified 05/13/2013  . Acute on chronic combined systolic and diastolic CHF, NYHA class 3 ([place]) 03/19/2013  . High anion gap metabolic acidosis 123456  . Nausea & vomiting 03/14/2013  . SBO (small bowel obstruction) ([place]) 03/14/2013  . Biventricular cardiac pacemaker - St Jude, Nov 2014 03/04/2013  . Chronic combined systolic and diastolic CHF, NYHA class 2 ([place]) 01/08/2013  . S/P CABG x 4: 09/28/12 (LIMA-LAD, SVG-OM1-OM2, SVG-Intermediate) 09/29/2012  . Unstable angina ([place]) 08/31/2012  . Right carotid bruit 08/11/2012  . PAF (paroxysmal atrial fibrillation) ([place]) 07/21/2012  . Ischemic cardiomyopathy   . CAD (coronary artery disease)   . Anogenital warts 01/22/2011  . Type 2 diabetes mellitus, uncontrolled ([place]) 09/22/2009  . Hyperlipidemia 09/19/2009  . Essential hypertension 09/19/2009  . MYOCARDIAL INFARCTION, HX OF 09/19/2009  . Allergic rhinitis 09/19/2009  . BREAST CANCER, HX OF 09/19/2009   Ruben Im,  PT 09/28/2015 2:34 PM Phone: [phone] Fax: [phone]  Alvera Singh 09/28/2015, 2:33 PM  [facility] [address] [place], [place], [address] Phone: [phone]   Fax:  [phone]  Name: [name] MRN: [id number] Date of Birth: 07/05/1950

## 2015-09-28 NOTE — Telephone Encounter (Signed)
Spoke to patient and sent Rx for Repatha to specialty pharmacy. She is eligible for copay card so have left one at front desk for her to pick up.   Instructed her to call office when she does first injection so that we can order labs for 2-3 months after first injection.   She stated she understood and appreciated our help with getting medication covered.

## 2015-10-03 ENCOUNTER — Encounter: Payer: Self-pay | Admitting: Family Medicine

## 2015-10-03 ENCOUNTER — Ambulatory Visit: Payer: BLUE CROSS/BLUE SHIELD | Admitting: Internal Medicine

## 2015-10-03 ENCOUNTER — Ambulatory Visit (INDEPENDENT_AMBULATORY_CARE_PROVIDER_SITE_OTHER): Payer: BLUE CROSS/BLUE SHIELD | Admitting: Internal Medicine

## 2015-10-03 ENCOUNTER — Ambulatory Visit (INDEPENDENT_AMBULATORY_CARE_PROVIDER_SITE_OTHER): Payer: BLUE CROSS/BLUE SHIELD | Admitting: Family Medicine

## 2015-10-03 ENCOUNTER — Encounter: Payer: Self-pay | Admitting: Internal Medicine

## 2015-10-03 VITALS — BP 102/60 | HR 85 | Ht 64.0 in | Wt 151.0 lb

## 2015-10-03 VITALS — BP 120/62 | HR 86 | Temp 98.3°F | Ht 64.0 in | Wt 151.0 lb

## 2015-10-03 DIAGNOSIS — J22 Unspecified acute lower respiratory infection: Secondary | ICD-10-CM

## 2015-10-03 DIAGNOSIS — M899 Disorder of bone, unspecified: Secondary | ICD-10-CM | POA: Diagnosis not present

## 2015-10-03 DIAGNOSIS — M898X9 Other specified disorders of bone, unspecified site: Secondary | ICD-10-CM

## 2015-10-03 DIAGNOSIS — E1159 Type 2 diabetes mellitus with other circulatory complications: Secondary | ICD-10-CM | POA: Diagnosis not present

## 2015-10-03 DIAGNOSIS — M159 Polyosteoarthritis, unspecified: Secondary | ICD-10-CM

## 2015-10-03 DIAGNOSIS — G894 Chronic pain syndrome: Secondary | ICD-10-CM | POA: Diagnosis not present

## 2015-10-03 DIAGNOSIS — J988 Other specified respiratory disorders: Secondary | ICD-10-CM

## 2015-10-03 HISTORY — DX: Type 2 diabetes mellitus with other circulatory complications: E11.59

## 2015-10-03 MED ORDER — OXYCODONE-ACETAMINOPHEN 5-325 MG PO TABS: 1.0000 | ORAL_TABLET | Freq: Every evening | ORAL | 0 refills | Status: DC | PRN

## 2015-10-03 MED ORDER — DOXYCYCLINE HYCLATE 100 MG PO TABS: 100.0000 mg | ORAL_TABLET | Freq: Two times a day (BID) | ORAL | 0 refills | Status: AC

## 2015-10-03 MED ORDER — BENZONATATE 100 MG PO CAPS: 200.0000 mg | ORAL_CAPSULE | Freq: Two times a day (BID) | ORAL | 0 refills | Status: AC | PRN

## 2015-10-03 NOTE — Patient Instructions (Addendum)
A few things to remember from today's visit:   Acute respiratory infection - Plan: benzonatate (TESSALON) 100 MG capsule, doxycycline (VIBRA-TABS) 100 MG tablet  Generalized osteoarthrosis, involving multiple sites - Plan: oxyCODONE-acetaminophen (ROXICET) 5-325 MG tablet  Chronic pain disorder - Plan: oxyCODONE-acetaminophen (ROXICET) 5-325 MG tablet  Most likely viral infection, which is usually self-limited,so symptomatic treatment is usually recommended. Because hx of DM 2 and heart disease antibiotic recommended.   Over the counter medications as decongestants and cold medications usually help, they need to be taken with caution if there is a history of high blood pressure or palpitations, so given her history of hypertension and heart disease not recommended.   Plenty of fluids. Honey helps with cough. Steam inhalations helps with runny nose, nasal congestion, and may prevent sinus infections. Cough and nasal congestion could last a few days and sometimes weeks. Please follow in not any better in 1-2 weeks or if symptoms get worse.   Pain is chronic and the goal with medication is to decrease pain to a level when you are able to function, pain will not be zero. Opioid medications have many side effects: constipation, nausea, vomiting,sedation, decreased psychomotor function, urinary retention, addiction among some.    Bring your medications with you for office visits.  At some point if I feel like pain is not adequately controlled, I will recommend referral to pain specialists.    Please be sure medication list is accurate. If a new problem present, please set up appointment sooner than planned today.

## 2015-10-03 NOTE — Progress Notes (Signed)
Pre visit review using our clinic review tool, if applicable. No additional management support is needed unless otherwise documented below in the visit note. 

## 2015-10-03 NOTE — Progress Notes (Signed)
HPI:  ACUTE VISIT:  Chief Complaint  Patient presents with  . Cough    Started Friday AM, nose congestion & head congestion, no fever.     [name] is a 65 y.o.female here today complaining of 4-5 days of respiratory symptoms.  + Non-productive cough, worse at night when laying down. Nasal congestion, rhinorrhea, postnasal drip, and sore throat. She has not noted fever, she has had some chills. No chest pain, dyspnea, or wheezing associated.  No Hx of recent travel. No sick contact. No known insect bite. Hx of allergic rhinitis.  OTC medications for this problem: None. Symptoms getting worse. Hx of DM II, reporting "good" BS's.     Chronic pain: Hx of generalized OA, intermittent pain that can sometimes being severe, affecting mainly hips and shoulders, radiated to arms and thighs. Hx  of back pain, cervical and lower back. Lab work was done 08/11/15 and otherwise normal. Hx of fatigue.  Limitation of ROM mainly shoulders. Pain is exacerbated by movement, prolonged rest or standing. Alleviated with changes in position and gentle ROM. + Stiffness, aggravated with prolonged rest, more noticeable in the morning. Relieved with movement  She was referred to orthopedist, already evaluated. According to patient, she had several think imaging and it was determined that arthralgias or By osteoarthritis. She is also doing PT and received a right shoulder injection, which has helped little.  She tried Cymbalta, discontinued because nausea and "tunnel vision."  Today she is requesting a refill for Percocet, which she takes at bedtime and has helped to control pain and therefore sleeping better. She doesn't take medication during the day because afraid of side effects. She is tolerating medication well.    Review of Systems  Constitutional: Negative for activity change, appetite change, fatigue and fever.  HENT: Positive for congestion, postnasal drip, rhinorrhea,  sinus pressure, sore throat and voice change. Negative for ear pain, mouth sores, nosebleeds and trouble swallowing.   Eyes: Negative for photophobia, discharge, redness and visual disturbance.  Respiratory: Positive for cough. Negative for shortness of breath and wheezing.   Cardiovascular: Negative for chest pain and leg swelling.  Gastrointestinal: Negative for abdominal pain, diarrhea, nausea and vomiting.  Musculoskeletal: Positive for arthralgias, back pain, myalgias and neck pain. Negative for joint swelling.  Skin: Negative for pallor and rash.  Allergic/Immunologic: Positive for environmental allergies.  Neurological: Positive for headaches (frontal pressure, aggravated by cough). Negative for syncope, weakness and numbness.  Hematological: Negative for adenopathy. Does not bruise/bleed easily.  Psychiatric/Behavioral: Positive for sleep disturbance (due to cough). Negative for confusion. The patient is nervous/anxious.       Current Outpatient Prescriptions on File Prior to Visit  Medication Sig Dispense Refill  . aspirin 81 MG tablet Take 1 tablet (81 mg total) by mouth daily. 30 tablet   . BAYER MICROLET LANCETS lancets Use as instructed 100 each 2  . Blood Glucose Monitoring Suppl (BAYER CONTOUR NEXT LINK) w/Device KIT Use to check blood sugar 2 times per day. 1 kit 2  . carvedilol (COREG) 25 MG tablet Take 0.5 tablets (12.5 mg total) by mouth 2 (two) times daily. 60 tablet 2  . Dulaglutide (TRULICITY) 1.5 XB/2.8UX SOPN Inject 1.5 mg into the skin every Sunday. **PT NEEDS FOLLOW UP APPT FOR FUTURE REFILLS** 4 pen 1  . Evolocumab (REPATHA SURECLICK) 324 MG/ML SOAJ Inject 140 mg into the skin every 14 (fourteen) days. 2 pen 5  . furosemide (LASIX) 40 MG tablet TAKE 1 TABLET (  40 MG TOTAL) BY MOUTH DAILY. 90 tablet 3  . glipiZIDE (GLUCOTROL) 5 MG tablet TAKE 1/2  TABLETS (2.5 MG TOTAL) BY MOUTH 2 (TWO) TIMES DAILY BEFORE MEAL. 28 tablet 2  . glucose blood (BAYER CONTOUR NEXT TEST)  test strip Use to check blood sugar 2 times per day. 150 each 2  . glucose blood test strip Use to check blood sugar 2 times per day. 150 each 2  . HYDROcodone-acetaminophen (NORCO/VICODIN) 5-325 MG tablet   0  . INVOKANA 100 MG TABS tablet TAKE 1 TABLET BY MOUTH EVERY MORNING 30 tablet 0  . losartan (COZAAR) 25 MG tablet Take 1 tablet (25 mg total) by mouth 2 (two) times daily. 180 tablet 2  . metFORMIN (GLUCOPHAGE) 1000 MG tablet TAKE 1 TABLET (1,000 MG TOTAL) BY MOUTH 2 (TWO) TIMES DAILY WITH A MEAL. 60 tablet 0  . nitroGLYCERIN (NITROSTAT) 0.4 MG SL tablet Place 1 tablet (0.4 mg total) under the tongue every 5 (five) minutes as needed for chest pain. 25 tablet 3  . rivaroxaban (XARELTO) 20 MG TABS tablet Take 1 tablet (20 mg total) by mouth daily with supper. 30 tablet 5   No current facility-administered medications on file prior to visit.      Past Medical History:  Diagnosis Date  . AICD (automatic cardioverter/defibrillator) present 01/2013   Biventricular cardiac pacemaker in situ   . Allergic rhinitis   . CAD (coronary artery disease)    a. 2004: s/p MI in [place]. No PCI->Medical RX;  b. 07/2012 Cath: LM 30-40, LAD 70p, 70/74m D1 80-90p, OM1 small 90p, OM2 large 80-90p, 573m70-80d, RCA 20-30 diff, EF 40%, glob HK.s/p CABG  . [facility]   Patient reports left breast cancer diagnosis in 2002 treated with bilateral mastectomy positive lymph nodes with left axillary dissection followed by chemotherapy of unknown type  . Cataract   . Diabetes mellitus type II   . Exertional shortness of breath   . Hyperlipidemia   . Hypertension   . Ischemic cardiomyopathy    a. 07/2012 Echo: EF 35%, Sev inferoseptal HK, mildly dil LA, Peak PASP 5939m.  . LMarland KitchenBB (left bundle branch block)    a. intermittent - present during rapid afib 07/2012.  . NMarland Kitchenuromuscular disorder ([facility]  Patient reports chronic numbness in the right foot related to previous surgery on the right leg and  "nerve damage"  . [place] ([place]ox[place]mal [place] fibrillation) [place]  a. 07/2012: Amio and xarelto initiated.  . SBO (small bowel obstruction) ([facility])    Allergies  Allergen Reactions  . Statins Other (See Comments)    Myalgias with rosuvastatin, atorvastatin and simvastatin     Social History   Social History  . Marital status: Married    Spouse name: N/A  . Number of children: 3  . 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   Social History Main Topics  . Smoking status: Former Smoker    Packs/day: 1.00    Years: 10.00    Types: Cigarettes    Quit date: 03/11/2012  . Smokeless tobacco: Never Used  . Alcohol use No  . Drug use: No  . Sexual activity: Yes    Partners: Female   Other Topics Concern  . Not on file   Social History Narrative   Lives with husband and mother in law.     Regular exercise: walking   Caffeine use: 2 cups  of coffee in the morning    Vitals:   10/03/15 1603  BP: 120/62  Pulse: 86  Temp: 98.3 F (36.8 C)   Body mass index is 25.92 kg/m.  O2 sat at RA 98%.    Physical Exam  Constitutional: She is oriented to person, place, and time. She appears well-developed and well-nourished. No distress.  HENT:  Head: Atraumatic.  Right Ear: Hearing, tympanic membrane, external ear and ear canal normal.  Left Ear: Hearing, tympanic membrane, external ear and ear canal normal.  Nose: No rhinorrhea. Right sinus exhibits no maxillary sinus tenderness and no frontal sinus tenderness. Left sinus exhibits no maxillary sinus tenderness and no frontal sinus tenderness.  Mouth/Throat: Uvula is midline and mucous membranes are normal. Posterior oropharyngeal erythema present. No oropharyngeal exudate or posterior oropharyngeal edema.  Hypertrophic turbinates. Postnasal drainage. Mild dysphonia.  Eyes: Conjunctivae are normal.  Neck: No muscular tenderness present. No edema and no erythema present.  Cardiovascular:  Normal rate and regular rhythm.   Respiratory: Effort normal and breath sounds normal. No respiratory distress.  Musculoskeletal:  Limitation on ROM of shoulders, right >left, pain elicited with movement. No signs of synovitis.  Lymphadenopathy:       Head (right side): No submandibular adenopathy present.       Head (left side): No submandibular adenopathy present.    She has cervical adenopathy.       Right cervical: Superficial cervical adenopathy present.       Left cervical: Superficial cervical adenopathy present.  Neurological: She is alert and oriented to person, place, and time.  Skin: Skin is warm. No rash noted. No erythema.  Psychiatric: She has a normal mood and affect.  Well groomed, good eye contact.      ASSESSMENT AND PLAN:     Dorreen was seen today for cough.  Diagnoses and all orders for this visit:  Acute respiratory infection  Symptoms suggests a viral etiology, I explained patient that symptomatic treatment is usually recommended but because history of DM 2 and heart disease plus reporting symptoms getting worse abx was recommended. Some side effects discussed.  Instructed to monitor for signs of complications, including new onset of fever among some, clearly instructed about warning signs. I also explained that cough and nasal congestion can last a few days and sometimes weeks. F/U as needed.   -     benzonatate (TESSALON) 100 MG capsule; Take 2 capsules (200 mg total) by mouth 2 (two) times daily as needed for cough. -     doxycycline (VIBRA-TABS) 100 MG tablet; Take 1 tablet (100 mg total) by mouth 2 (two) times daily.   Generalized osteoarthrosis, involving multiple sites  According to patient, she is not supposed to follow with orthopedist. She will to continue Percocet at bedtime, some side effects discussed. She is now interested in trying other medication for now. She will continue PT.   -     oxyCODONE-acetaminophen (ROXICET) 5-325 MG  tablet; Take 1 tablet by mouth at bedtime as needed for moderate pain or severe pain.   Chronic pain disorder  Follow-up in 4-6 weeks, in which time we will sign a medication contract.  -     oxyCODONE-acetaminophen (ROXICET) 5-325 MG tablet; Take 1 tablet by mouth at bedtime as needed for moderate pain or severe pain.       -Ms.Tascha Casares advised to return or notify a doctor immediately if symptoms worsen or persist or new concerns arise.  Betty G. Martinique, MD  [facility]. [place] office.

## 2015-10-03 NOTE — Patient Instructions (Signed)
Please move: - Metformin 2000 mg with dinner  Please continue: - Glipizide 2.5 mg in am and 2.5 before dinner - Invokana 123XX123 mg in am  - Trulicity 1.5 mg daily weekly  Please come back for a vitamin D level.  Please come back for a follow-up appointment in 3 months

## 2015-10-03 NOTE — Progress Notes (Addendum)
Patient ID: [name], female   DOB: 1950/10/24, 66 y.o.   MRN: [id number]  HPI: [name] is a 65 y.o.-year-old female, returning for f/u for DM2, dx 2002, insulin-independent, uncontrolled, with complications (CAD, s/p CABG). Last visit 1 year ago.  She has a lot of bone pain >> less walking. She stopped statins >> better, but still has a lot of pain.   Last hemoglobin A1c was: Lab Results  Component Value Date   HGBA1C 6.5 (H) 08/11/2015   HGBA1C 7.7 (H) 04/10/2015   HGBA1C 6.7 09/22/2014  Patient has been on insulin before >> hypoglycemia.   Pt is on a regimen of: - Metformin 1000 mg po bid - Glipizide 10 mg bid >> 10 mg in am and 5 mg in pm >> 5 mg bid >> 2.5 mg bid - Invokana 100 mg (started 10/2013) - no yeast inf or UTIs. - Bydureon 2 mg weekly - started 123456 >> Trulicity 1.5 mg weekly Could not afford Januvia 100 mg daily >> 100$ reduced from 348%. She was on Amaryl 2 mg bid.  Pt checks her sugars 1x a day and they are higher in am: - am: 56-140 >> 107-186, most 120-140 >> 170-200 >> 170-233 >> 135-140 >> 120-150 >> 139-187 - before lunch: 80-90 >> 70-191 >> 74 few times, 100-160 >> 130-220 >> 80-120 >> 80-120 >> 108-110 - after lunch: 130-140 >> 92-157 - before dinner: 56-60s but sometimes 102 >> 49-156 >> 100-150 >> 98-161 >> 115-130 >> 50, 70-120, 140 >> 88-190 - bedtime: 80-110 >> 60-112 >> n/c >> 63, 81 >> 100 >> 100-120, 170 if has fruit >> n/c Still has lows. Lowest sugar was 74 >> 63 after dinner x1 >> 50s >> 90; she has hypoglycemia awareness at 60. Highest sugar was 233 >> <200 >> 190.  - no CKD, last BUN/creatinine:  Lab Results  Component Value Date   BUN 18 08/11/2015   CREATININE 0.72 08/11/2015  she is on losartan. - last set of lipids: Lab Results  Component Value Date   CHOL 195 09/15/2015   HDL 55 09/15/2015   LDLCALC 97 09/15/2015   LDLDIRECT 134.3 09/19/2009   TRIG 214 (H) 09/15/2015   CHOLHDL 3.5 09/15/2015  she is on Crestor <<  prev. atorvastatin. - last eye exam was 03/2014. Has cataracts. No DR.  - no numbness and tingling in her feet. She is on aspirin 81.  ROS: Constitutional: no weight gain/loss, no fatigue, no subjective hyperthermia/hypothermia Eyes: no blurry vision, no xerophthalmia ENT: no sore throat, no nodules palpated in throat, no dysphagia/odynophagia, no hoarseness, + congestion Cardiovascular: no CP/SOB/no palpitations/+ leg swelling Respiratory: no cough/SOB Gastrointestinal: no N/V/D/C Musculoskeletal: + muscle/+ joint aches Skin: no rashes Neurological: no tremors/numbness/tingling/dizziness  I reviewed pt's medications, allergies, PMH, social hx, family hx, and changes were documented in the history of present illness. Otherwise, unchanged from my initial visit note.  PE: BP 102/60 (BP Location: Right Arm, Patient Position: Sitting)   Pulse 85   Ht 5\' 4"  (1.626 m)   Wt 151 lb (68.5 kg)   SpO2 96%   BMI 25.92 kg/m  Body mass index is 25.92 kg/m.  Wt Readings from Last 3 Encounters:  10/03/15 151 lb (68.5 kg)  08/25/15 152 lb 1 oz (69 kg)  08/11/15 152 lb 5 oz (69.1 kg)   Constitutional: normal weight, in NAD Eyes: PERRLA, EOMI, no exophthalmos ENT: moist mucous membranes, no thyromegaly, no cervical lymphadenopathy Cardiovascular: RRR, 1/6 SEM, no RG, no  leg edema Respiratory: CTA B Gastrointestinal: abdomen soft, NT, ND, BS+ Musculoskeletal: no deformities, strength intact in all 4 Skin: moist, warm, no rashes Neurological: no tremor with outstretched hands, DTR normal in all 4  ASSESSMENT: 1. 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 Bone pain  PLAN:  1. Patient with long-standing, recently more controlled diabetes, on oral antidiabetic regimen. Last HbA1c is great at 6.5% - 2 mo ago! She has no more lows after we decreased Glipizide  further to 2.5 mg bid. She does have higher sugars in am >> will move all Metformin at dinnertime. - I advised her to: Patient Instructions  Please move: - Metformin 2000 mg with dinner  Please continue: - Glipizide 2.5 mg in am and 2.5 before dinner - Invokana 123XX123 mg in am  - Trulicity 1.5 mg daily weekly  Please come back for a vitamin D level.  Please come back for a follow-up appointment in 3 months  - continue checking her sugars at different times of the day - check 2 times a day, rotating checks  - overdue eye exams >> will have an appt soon with Dr Bing Plume - Return to clinic in 3 mo with sugar log  2. Bone pain - reviewed prev. Investigation  - will also check a vit D level >> will come back for this as she is now in a hurry to get to another appt (for Upper resp congestion)  Lab Results  Component Value Date   VD25OH 12.35 (L) 10/10/2015   Vitamin D is very low, will start 5000 units vitamin D3 daily and check the level at next visit. This can contribute to her bone pain.

## 2015-10-04 ENCOUNTER — Ambulatory Visit (INDEPENDENT_AMBULATORY_CARE_PROVIDER_SITE_OTHER): Payer: BLUE CROSS/BLUE SHIELD | Admitting: *Deleted

## 2015-10-04 DIAGNOSIS — I5042 Chronic combined systolic (congestive) and diastolic (congestive) heart failure: Secondary | ICD-10-CM

## 2015-10-04 DIAGNOSIS — Z950 Presence of cardiac pacemaker: Secondary | ICD-10-CM

## 2015-10-04 DIAGNOSIS — I255 Ischemic cardiomyopathy: Secondary | ICD-10-CM

## 2015-10-04 NOTE — Progress Notes (Signed)
Remote pacemaker transmission.   

## 2015-10-05 ENCOUNTER — Ambulatory Visit: Payer: BLUE CROSS/BLUE SHIELD | Admitting: Internal Medicine

## 2015-10-05 DIAGNOSIS — H2513 Age-related nuclear cataract, bilateral: Secondary | ICD-10-CM | POA: Diagnosis not present

## 2015-10-06 ENCOUNTER — Encounter: Payer: Self-pay | Admitting: Cardiology

## 2015-10-07 ENCOUNTER — Other Ambulatory Visit: Payer: Self-pay | Admitting: Internal Medicine

## 2015-10-09 ENCOUNTER — Ambulatory Visit: Payer: BLUE CROSS/BLUE SHIELD | Admitting: Physical Therapy

## 2015-10-09 ENCOUNTER — Encounter: Payer: Self-pay | Admitting: Physical Therapy

## 2015-10-09 DIAGNOSIS — M25512 Pain in left shoulder: Secondary | ICD-10-CM | POA: Diagnosis not present

## 2015-10-09 DIAGNOSIS — M25611 Stiffness of right shoulder, not elsewhere classified: Secondary | ICD-10-CM

## 2015-10-09 DIAGNOSIS — M25612 Stiffness of left shoulder, not elsewhere classified: Secondary | ICD-10-CM

## 2015-10-09 DIAGNOSIS — M6281 Muscle weakness (generalized): Secondary | ICD-10-CM

## 2015-10-09 DIAGNOSIS — M545 Low back pain, unspecified: Secondary | ICD-10-CM

## 2015-10-09 DIAGNOSIS — M25511 Pain in right shoulder: Secondary | ICD-10-CM | POA: Diagnosis not present

## 2015-10-09 NOTE — Patient Instructions (Signed)
Posterior Capsule Stretch    Stand or sit, one arm across body so hand rests over opposite shoulder. Gently push on crossed elbow with other hand until stretch is felt in shoulder of crossed arm. Hold _20__ seconds.  Repeat __3_ times per session. Do _2__ sessions per day.  Copyright  VHI. All rights reserved.

## 2015-10-09 NOTE — Therapy (Signed)
[name] MRN: [id number] Date of Birth: [birth year]  [name] MRN: [id number] Date of Birth: [birth year]  [facility] [address], [place], [place], [address] Phone: [phone]   Fax:  [phone]  Physical Therapy Treatment  Patient Details  Name: [name] MRN: [id number] Date of Birth: 1951/01/26 Referring Provider: Martinique, Betty, MD  Encounter Date: 10/09/2015      PT End of Session - 10/09/15 1403    Visit Number 4   Date for PT Re-Evaluation 11/08/15   PT Start Time [id number]   PT Stop Time 1438   PT Time Calculation (min) 40 min   Activity Tolerance Patient tolerated treatment well   Behavior During Therapy [facility] for tasks assessed/performed      Past Medical History:  Diagnosis Date  . AICD (automatic cardioverter/defibrillator) present 01/2013   Biventricular cardiac pacemaker in situ   . Allergic rhinitis   . CAD (coronary artery disease)    a. 2004: s/p MI in [place]. No PCI->Medical RX;  b. 07/2012 Cath: LM 30-40, LAD 70p, 70/1m, D1 80-90p, OM1 small 90p, OM2 large 80-90p, 82m, 70-80d, RCA 20-30 diff, EF 40%, glob HK.s/p CABG  . Cancer of left breast [facility]) 2002   Patient reports left breast cancer diagnosis in 2002 treated with bilateral mastectomy positive lymph nodes with left axillary dissection followed by chemotherapy of unknown type  . Cataract   . Diabetes mellitus type II   . Exertional shortness of breath   . Hyperlipidemia   . Hypertension   . Ischemic cardiomyopathy    a. 07/2012 Echo: EF 35%, Sev inferoseptal HK, mildly dil LA, Peak PASP 48mmHg.  Marland Kitchen LBBB (left bundle branch block)    a. intermittent - present during rapid afib 07/2012.  Marland Kitchen Neuromuscular disorder ([place])    Patient reports chronic numbness in the right foot related to previous surgery on the right leg and "nerve damage"  . PAF (paroxysmal atrial fibrillation) ([place])    a. 07/2012: Amio and xarelto initiated.  . SBO (small bowel obstruction) ([place])     Past Surgical History:  Procedure Laterality Date  . ABDOMINAL HYSTERECTOMY   2000  . APPENDECTOMY  1974  . BI-VENTRICULAR PACEMAKER INSERTION N/A 01/25/2013   Procedure: BI-VENTRICULAR PACEMAKER INSERTION (CRT-P);  Surgeon: Evans Lance, MD;  Location: [facility] CATH LAB;  Service: Cardiovascular;  Laterality: N/A;  . BREAST BIOPSY Left 2002  . CARDIAC CATHETERIZATION    . CHOLECYSTECTOMY OPEN  1974  . CORONARY ARTERY BYPASS GRAFT N/A 09/28/2012   Procedure: CORONARY ARTERY BYPASS GRAFTING (CABG);  Surgeon: [name], MD;  Location: [place];  Service: Open Heart Surgery;  Laterality: N/A;  CABG x four, using left internal mammary artery and left leg greater saphenous vein harvested endoscopically  . DILATION AND CURETTAGE OF UTERUS  1983  . EPICARDIAL PACING LEAD PLACEMENT N/A 09/28/2012   Procedure: EPICARDIAL PACING LEAD PLACEMENT;  Surgeon: [name], MD;  Location: [place];  Service: Thoracic;  Laterality: N/A;  LV LEAD PLACEMENT  . HERNIA REPAIR    . INCISIONAL HERNIA REPAIR N/A 05/25/2015   Procedure: LAPAROSCOPIC INCISIONAL HERNIA WITH MESH ;  Surgeon: [name], MD;  Location: [place];  Service: General;  Laterality: N/A;  . INCONTINENCE SURGERY  2000  . INSERTION OF MESH N/A 05/25/2015   Procedure: INSERTION OF MESH;  Surgeon: [name], MD;  Location: [place];  Service: General;  Laterality: N/A;  . INTRAOPERATIVE TRANSESOPHAGEAL ECHOCARDIOGRAM N/A 09/28/2012   Procedure: INTRAOPERATIVE TRANSESOPHAGEAL ECHOCARDIOGRAM;  Surgeon: [name], MD;  Location: [place];  Service: Open  [facility] [address], [place], [place], [address] Phone: [phone]   Fax:  [phone]  Physical Therapy Treatment  Patient Details  Name: [name] MRN: [id number] Date of Birth: 1951/01/26 Referring Provider: Martinique, Betty, MD  Encounter Date: 10/09/2015      PT End of Session - 10/09/15 1403    Visit Number 4   Date for PT Re-Evaluation 11/08/15   PT Start Time [id number]   PT Stop Time 1438   PT Time Calculation (min) 40 min   Activity Tolerance Patient tolerated treatment well   Behavior During Therapy [facility] for tasks assessed/performed      Past Medical History:  Diagnosis Date  . AICD (automatic cardioverter/defibrillator) present 01/2013   Biventricular cardiac pacemaker in situ   . Allergic rhinitis   . CAD (coronary artery disease)    a. 2004: s/p MI in [place]. No PCI->Medical RX;  b. 07/2012 Cath: LM 30-40, LAD 70p, 70/1m, D1 80-90p, OM1 small 90p, OM2 large 80-90p, 82m, 70-80d, RCA 20-30 diff, EF 40%, glob HK.s/p CABG  . Cancer of left breast [facility]) 2002   Patient reports left breast cancer diagnosis in 2002 treated with bilateral mastectomy positive lymph nodes with left axillary dissection followed by chemotherapy of unknown type  . Cataract   . Diabetes mellitus type II   . Exertional shortness of breath   . Hyperlipidemia   . Hypertension   . Ischemic cardiomyopathy    a. 07/2012 Echo: EF 35%, Sev inferoseptal HK, mildly dil LA, Peak PASP 48mmHg.  Marland Kitchen LBBB (left bundle branch block)    a. intermittent - present during rapid afib 07/2012.  Marland Kitchen Neuromuscular disorder ([place])    Patient reports chronic numbness in the right foot related to previous surgery on the right leg and "nerve damage"  . PAF (paroxysmal atrial fibrillation) ([place])    a. 07/2012: Amio and xarelto initiated.  . SBO (small bowel obstruction) ([place])     Past Surgical History:  Procedure Laterality Date  . ABDOMINAL HYSTERECTOMY   2000  . APPENDECTOMY  1974  . BI-VENTRICULAR PACEMAKER INSERTION N/A 01/25/2013   Procedure: BI-VENTRICULAR PACEMAKER INSERTION (CRT-P);  Surgeon: Evans Lance, MD;  Location: [facility] CATH LAB;  Service: Cardiovascular;  Laterality: N/A;  . BREAST BIOPSY Left 2002  . CARDIAC CATHETERIZATION    . CHOLECYSTECTOMY OPEN  1974  . CORONARY ARTERY BYPASS GRAFT N/A 09/28/2012   Procedure: CORONARY ARTERY BYPASS GRAFTING (CABG);  Surgeon: [name], MD;  Location: [facility];  Service: Open Heart Surgery;  Laterality: N/A;  CABG x four, using left internal mammary artery and left leg greater saphenous vein harvested endoscopically  . DILATION AND CURETTAGE OF UTERUS  1983  . EPICARDIAL PACING LEAD PLACEMENT N/A 09/28/2012   Procedure: EPICARDIAL PACING LEAD PLACEMENT;  Surgeon: [name], MD;  Location: [place];  Service: Thoracic;  Laterality: N/A;  LV LEAD PLACEMENT  . HERNIA REPAIR    . INCISIONAL HERNIA REPAIR N/A 05/25/2015   Procedure: LAPAROSCOPIC INCISIONAL HERNIA WITH MESH ;  Surgeon: [name], MD;  Location: [place];  Service: General;  Laterality: N/A;  . INCONTINENCE SURGERY  2000  . INSERTION OF MESH N/A 05/25/2015   Procedure: INSERTION OF MESH;  Surgeon: [name], MD;  Location: [place];  Service: General;  Laterality: N/A;  . INTRAOPERATIVE TRANSESOPHAGEAL ECHOCARDIOGRAM N/A 09/28/2012   Procedure: INTRAOPERATIVE TRANSESOPHAGEAL ECHOCARDIOGRAM;  Surgeon: [name], MD;  Location: [place];  Service: Open

## 2015-10-09 NOTE — Telephone Encounter (Signed)
Refill of medication  glipiZIDE (GLUCOTROL) 5 MG tablet INVOKANA 100 MG TABS tablet metFORMIN (GLUCOPHAGE) 123XX123 MG tablet TRULICITY 1.5 0000000 [place] [address], [place] [phone] (Phone) [phone] (Fax)

## 2015-10-10 ENCOUNTER — Other Ambulatory Visit: Payer: Self-pay | Admitting: Internal Medicine

## 2015-10-10 ENCOUNTER — Other Ambulatory Visit (INDEPENDENT_AMBULATORY_CARE_PROVIDER_SITE_OTHER): Payer: BLUE CROSS/BLUE SHIELD

## 2015-10-10 DIAGNOSIS — M899 Disorder of bone, unspecified: Secondary | ICD-10-CM

## 2015-10-10 DIAGNOSIS — M898X9 Other specified disorders of bone, unspecified site: Secondary | ICD-10-CM

## 2015-10-10 LAB — CUP PACEART REMOTE DEVICE CHECK
Battery Remaining Longevity: 79 mo
Battery Remaining Percentage: 95.5 %
Battery Voltage: 2.96 V
Brady Statistic RA Percent Paced: 1 %
Implantable Lead Implant Date: 20141117
Implantable Lead Location: 753858
Implantable Lead Location: 753859
Lead Channel Impedance Value: 400 Ohm
Lead Channel Setting Pacing Pulse Width: 0.4 ms
Lead Channel Setting Sensing Sensitivity: 2 mV
MDC IDC LEAD IMPLANT DT: 20140721
MDC IDC LEAD IMPLANT DT: 20141117
MDC IDC LEAD LOCATION: 753860
MDC IDC MSMT LEADCHNL RA IMPEDANCE VALUE: 400 Ohm
MDC IDC MSMT LEADCHNL RA SENSING INTR AMPL: 1.2 mV
MDC IDC MSMT LEADCHNL RV IMPEDANCE VALUE: 450 Ohm
MDC IDC MSMT LEADCHNL RV SENSING INTR AMPL: 11.9 mV
MDC IDC SESS DTM: 20170726084335
MDC IDC SET LEADCHNL LV PACING AMPLITUDE: 2.25 V
MDC IDC SET LEADCHNL LV PACING PULSEWIDTH: 0.5 ms
MDC IDC SET LEADCHNL RA PACING AMPLITUDE: 2 V
MDC IDC SET LEADCHNL RV PACING AMPLITUDE: 2.5 V
MDC IDC STAT BRADY AP VP PERCENT: 1 %
MDC IDC STAT BRADY AP VS PERCENT: 1 %
MDC IDC STAT BRADY AS VP PERCENT: 99 %
MDC IDC STAT BRADY AS VS PERCENT: 1 %
Pulse Gen Model: 3222
Pulse Gen Serial Number: 2981305

## 2015-10-10 LAB — VITAMIN D 25 HYDROXY (VIT D DEFICIENCY, FRACTURES): VITD: 12.35 ng/mL — ABNORMAL LOW (ref 30.00–100.00)

## 2015-10-12 ENCOUNTER — Ambulatory Visit: Payer: BLUE CROSS/BLUE SHIELD | Attending: Family Medicine | Admitting: Physical Therapy

## 2015-10-12 DIAGNOSIS — M25511 Pain in right shoulder: Secondary | ICD-10-CM | POA: Insufficient documentation

## 2015-10-12 DIAGNOSIS — M25611 Stiffness of right shoulder, not elsewhere classified: Secondary | ICD-10-CM

## 2015-10-12 DIAGNOSIS — M545 Low back pain, unspecified: Secondary | ICD-10-CM

## 2015-10-12 DIAGNOSIS — M6281 Muscle weakness (generalized): Secondary | ICD-10-CM | POA: Insufficient documentation

## 2015-10-12 DIAGNOSIS — M25512 Pain in left shoulder: Secondary | ICD-10-CM | POA: Diagnosis not present

## 2015-10-12 NOTE — Patient Instructions (Signed)
[name] PT [facility] [address], [address] [place], [place] [address] Phone # [phone] Fax [phone]    

## 2015-10-12 NOTE — Therapy (Addendum)
[facility] [address] [place], [place], [address] Phone: [phone]   Fax:  [phone]  Physical Therapy Treatment/Discharge Summary  Patient Details  Name: [name] MRN: [id number] Date of Birth: 01/18/1951 Referring Provider: Martinique, Betty, MD  Encounter Date: 10/12/2015      PT End of Session - 10/12/15 1431    Visit Number 5   Date for PT Re-Evaluation 11/08/15   PT Start Time 5465   PT Stop Time 6812   PT Time Calculation (min) 38 min   Activity Tolerance Patient tolerated treatment well      Past Medical History:  Diagnosis Date  . AICD (automatic cardioverter/defibrillator) present 01/2013   Biventricular cardiac pacemaker in situ   . Allergic rhinitis   . CAD (coronary artery disease)    a. 2004: s/p MI in [place]. No PCI->Medical RX;  b. 07/2012 Cath: LM 30-40, LAD 70p, 70/56m D1 80-90p, OM1 small 90p, OM2 large 80-90p, 526m70-80d, RCA 20-30 diff, EF 40%, glob HK.s/p CABG  . [facility]   Patient reports left breast cancer diagnosis in 2002 treated with bilateral mastectomy positive lymph nodes with left axillary dissection followed by chemotherapy of unknown type  . Cataract   . Diabetes mellitus type II   . Exertional shortness of breath   . Hyperlipidemia   . Hypertension   . Ischemic cardiomyopathy    a. 07/2012 Echo: EF 35%, Sev inferoseptal HK, mildly dil LA, Peak PASP 5930m.  . LMarland KitchenBB (left bundle branch block)    a. intermittent - present during rapid afib 07/2012.  . NMarland Kitchenuromuscular disorder ([place]  Patient reports chronic numbness in the right foot related to previous surgery on the right leg and "nerve damage"  . PAF (paroxysmal atrial fibrillation) ([place]  a. 07/2012: Amio and xarelto initiated.  . SBO (small bowel obstruction) ([place]   Past Surgical History:  Procedure Laterality Date  . ABDOMINAL HYSTERECTOMY  2000  . APPENDECTOMY  1974  .  BI-VENTRICULAR PACEMAKER INSERTION N/A 01/25/2013   Procedure: BI-VENTRICULAR PACEMAKER INSERTION (CRT-P);  Surgeon: GreEvans LanceD;  [facility];  Service: Cardiovascular;  Laterality: N/A;  . BREAST BIOPSY Left 2002  . CARDIAC CATHETERIZATION    . CHOLECYSTECTOMY OPEN  1974  . CORONARY ARTERY BYPASS GRAFT N/A 09/28/2012   Procedure: CORONARY ARTERY BYPASS GRAFTING (CABG);  Surgeon: EdwGrace IsaacD;  [facility] [place] [place] [facility];  Laterality: N/A;  CABG x four, using left internal mammary artery and left leg greater saphenous vein harvested endoscopically  . DILATION AND CURETTAGE OF UTERUS  1983  . EPICARDIAL PACING LEAD PLACEMENT N/A 09/28/2012   Procedure: EPICARDIAL PACING LEAD PLACEMENT;  Surgeon: EdwGrace IsaacD;  [facility] [place];  Laterality: N/A;  LV LEAD PLACEMENT  . HERNIA REPAIR    . INCISIONAL HERNIA REPAIR N/A 05/25/2015   Procedure: LAPAROSCOPIC INCISIONAL HERNIA WITH MESH ;  Surgeon: MatRolm BookbinderD;  [facility] [place];  Laterality: N/A;  . INCONTINENCE SURGERY  2000  . INSERTION OF MESH N/A 05/25/2015   Procedure: INSERTION OF MESH;  Surgeon: MatRolm BookbinderD;  [facility] [place];  Laterality: N/A;  . INTRAOPERATIVE TRANSESOPHAGEAL ECHOCARDIOGRAM N/A 09/28/2012   Procedure: INTRAOPERATIVE TRANSESOPHAGEAL ECHOCARDIOGRAM;  Surgeon: EdwGrace IsaacD;  [facility] [place] [place] [facility];  Laterality: N/A;  . LAPAROSCOPIC  INCISIONAL / UMBILICAL / VENTRAL HERNIA REPAIR  05/25/2015   IHR  . LEFT HEART CATHETERIZATION WITH CORONARY ANGIOGRAM N/A 07/20/2012   Procedure: LEFT HEART CATHETERIZATION WITH CORONARY ANGIOGRAM;  Surgeon: [name], MD;  [facility] C[facility] LAB;  Service: Cardiovascular;  Laterality: N/A;  . MASTECTOMY Right 2002  . MASTECTOMY MODIFIED RADICAL W/ AXILLARY LYMPH NODES W/ OR W/O PECTORALIS MINOR Left 2002  . MAZE N/A 09/28/2012   Procedure:  MAZE;  Surgeon: [name], MD;  Location: [place];  Service: Open Heart Surgery;  Laterality: N/A;  . TENDON REPAIR Right 2001 X 3-4   torn ligaments and tendons in ankle up to knee from work related accident  . TUBAL LIGATION  1987    There were no vitals filed for this visit.      Subjective Assessment - 10/12/15 1406    Subjective I am feeling so much better.  My right shoulder is doing good.  No left shoulder or back pain.     Pertinent History history of breast cancer, pacemaker   Currently in Pain? No/denies   Pain Score 0-No pain   Pain Location Shoulder   Pain Orientation Right   Pain Type Chronic pain   Pain Onset More than a month ago                         [facility] Adult PT Treatment/Exercise - 10/12/15 0001      Shoulder Exercises: Prone   Retraction Strengthening;Right;15 reps;Weights;Left   Retraction Weight (lbs) 2   Extension Strengthening;Right;15 reps;Weights;Left   Extension Weight (lbs) 2   Horizontal ABduction 1 Strengthening;Right;15 reps;Weights;Left   Horizontal ABduction 1 Weight (lbs) 1   Horizontal ABduction 2 Strengthening;Right;15 reps;Weights;Left   Horizontal ABduction 2 Weight (lbs) 1     Shoulder Exercises: Standing   Protraction Strengthening;Both;15 reps  wall push ups   Other Standing Exercises biceps 4# 15x, triceps red band 15x right /left   Other Standing Exercises ball roll on mat table, on stair rail, on wall 15x each     Shoulder Exercises: ROM/Strengthening   UBE (Upper Arm Bike) 3 Forward, 3 backward L2                PT Education - 10/12/15 1429    Education provided Yes   Education Details kneeling rows, extensions, horiz abduction 1 and 2   Person(s) Educated Patient   Methods Explanation;Demonstration;Handout   Comprehension Verbalized understanding;Returned demonstration          PT Short Term Goals - 10/12/15 1438      PT SHORT TERM GOAL #1   Title be independent in initial HEP    Status Achieved     PT SHORT TERM GOAL #2   Title demonstrate Rt shoulder AROM to > or = to 105 degrees to improve reaching overhead   Status Achieved     PT SHORT TERM GOAL #3   Title report a 25% reduction in shoulder pain with reaching overhead   Status Achieved     PT SHORT TERM GOAL #4   Title verbalize and demonstrate correct body mechanics with sitting, lifting and housework    Status Achieved     PT SHORT TERM GOAL #5   Title report a 30% reduction in LBP with sitting at work   Status Achieved           PT Long Term Goals - 10/12/15 1439  PT LONG TERM GOAL #1   Title be independent in advanced HEP   Time 8   Period Weeks   Status On-going     PT LONG TERM GOAL #2   Title reduce FOTO to < or = to 36% limitation   Time 8   Period Weeks   Status On-going     PT LONG TERM GOAL #3   Title demonstrate Rt shoulder AROM flexion to > or = to 125 degrees to improve overhead reaching   Time 8   Period Weeks   Status On-going     PT LONG TERM GOAL #4   Title report a 60% reduction in shoulder pain with use overhead and with ADLs and self-care   Time 8   Period Weeks   Status On-going     PT LONG TERM GOAL #5   Title report a 60% reduction in LBP with sitting at work   Time 8   Period Weeks   Status On-going     PT LONG TERM GOAL #6   Title demonstrate Rt shoulder IR to L2 to improve use with self-care   Time 8   Period Weeks   Status On-going               Plan - 10/12/15 1432    Clinical Impression Statement The patient is making steady progress with bilateral shoulder ROM, strength and pain reduction.   All STGS met.   Patient states she can walk her bigger dog now.    She reports good compliance with current home ex program.  Therapist closely monitoring response with all exercises and cueing for proper technique including mid and lower trap muscle activation.     PT Next Visit Plan Probable discharge in 1-2 visits if majority of goals met;   Shoulder AAROM, gentle strength scapular muscles, deltoid and rotator cuff, hip and lumbar flexibility, core strength, body mechanics education;  NO ESTIM pacemaker     PHYSICAL THERAPY DISCHARGE SUMMARY  Visits from Start of Care: 5  Current functional level related to goals / functional outcomes: The patient called to cancel her last remaining appt.  She states she is doing much better and requests discharge from PT.     Remaining deficits: Good progress toward goals but unable to assess some goals secondary to patient not returning.   Education / Equipment: HEP  Plan: Patient agrees to discharge.  Patient goals were partially met. Patient is being discharged due to the patient's request.  ?????       Patient will benefit from skilled therapeutic intervention in order to improve the following deficits and impairments:     Visit Diagnosis: Pain in left shoulder  Pain in right shoulder  Stiffness of right shoulder, not elsewhere classified  Muscle weakness (generalized)  Bilateral low back pain without sciatica     Problem List Patient Active Problem List   Diagnosis Date Noted  . Type 2 diabetes mellitus with circulatory disorder, without long-term current use of insulin ([place]) 10/03/2015  . Chronic pain disorder 10/03/2015  . Generalized osteoarthrosis, involving multiple sites 08/25/2015  . Other fatigue 08/11/2015  . Anemia, unspecified 08/11/2015  . Back pain, chronic 08/11/2015  . Incisional hernia 05/25/2015  . Incisional hernia, without obstruction or gangrene 05/04/2015  . Exertional dyspnea 08/05/2014  . Routine general medical examination at a health care facility 04/14/2014  . Anemia 05/13/2013  . Anxiety state, unspecified 05/13/2013  . Acute on chronic combined systolic and diastolic  CHF, NYHA class 3 ([place]) 03/19/2013  . High anion gap metabolic acidosis 60/16/5800  . Nausea & vomiting 03/14/2013  . SBO (small bowel obstruction) ([place]) 03/14/2013   . Biventricular cardiac pacemaker - St Jude, Nov 2014 03/04/2013  . Chronic combined systolic and diastolic CHF, NYHA class 2 ([place]) 01/08/2013  . S/P CABG x 4: 09/28/12 (LIMA-LAD, SVG-OM1-OM2, SVG-Intermediate) 09/29/2012  . Unstable angina ([place]) 08/31/2012  . Right carotid bruit 08/11/2012  . PAF (paroxysmal atrial fibrillation) ([place]) 07/21/2012  . Ischemic cardiomyopathy   . CAD (coronary artery disease)   . Anogenital warts 01/22/2011  . Hyperlipidemia 09/19/2009  . Essential hypertension 09/19/2009  . MYOCARDIAL INFARCTION, HX OF 09/19/2009  . Allergic rhinitis 09/19/2009  . BREAST CANCER, HX OF 09/19/2009   Ruben Im, PT 10/12/15 2:44 PM Phone: [phone] Fax: [phone] Alvera Singh 10/12/2015, 2:43 PM  [facility] [address], [place], [place], [address] Phone: [phone]   Fax:  [phone]  Name: Adelis Docter MRN: [id number] Date of Birth: Mar 30, 1950

## 2015-10-16 DIAGNOSIS — H2511 Age-related nuclear cataract, right eye: Secondary | ICD-10-CM | POA: Diagnosis not present

## 2015-10-17 ENCOUNTER — Encounter: Payer: Self-pay | Admitting: Cardiovascular Disease

## 2015-10-17 ENCOUNTER — Encounter: Payer: BLUE CROSS/BLUE SHIELD | Admitting: Physical Therapy

## 2015-10-17 ENCOUNTER — Telehealth: Payer: Self-pay

## 2015-10-17 NOTE — Telephone Encounter (Signed)
Sent via epic 

## 2015-10-17 NOTE — Telephone Encounter (Signed)
Request for surgical clearance:   1. What type surgery is being performed? Cataract surgery  2. When is this surgery scheduled? 10/23/15 and 11/06/15  3. Are there any medications that need to be held prior to surgery and how long? Form states blood thinner do not need to be held for cataract surgery. Patient is taking ASA 81 and Xarelto 20  4. Name of the physician performing surgery: Dr Calvert Cantor  5. What is the office phone and fax number?   Phone [phone]  Fax [phone]  Physician does request last EKG and last pacemaker report.

## 2015-10-19 ENCOUNTER — Encounter: Payer: BLUE CROSS/BLUE SHIELD | Admitting: Physical Therapy

## 2015-10-20 NOTE — Telephone Encounter (Signed)
Letter and requested information faxed to [facility].

## 2015-10-23 DIAGNOSIS — H2511 Age-related nuclear cataract, right eye: Secondary | ICD-10-CM | POA: Diagnosis not present

## 2015-10-23 DIAGNOSIS — H25811 Combined forms of age-related cataract, right eye: Secondary | ICD-10-CM | POA: Diagnosis not present

## 2015-10-26 ENCOUNTER — Encounter: Payer: Self-pay | Admitting: Family Medicine

## 2015-10-26 ENCOUNTER — Encounter: Payer: Self-pay | Admitting: Cardiovascular Disease

## 2015-10-26 ENCOUNTER — Other Ambulatory Visit: Payer: Self-pay | Admitting: Cardiovascular Disease

## 2015-10-26 DIAGNOSIS — H2512 Age-related nuclear cataract, left eye: Secondary | ICD-10-CM | POA: Diagnosis not present

## 2015-10-26 DIAGNOSIS — E785 Hyperlipidemia, unspecified: Secondary | ICD-10-CM

## 2015-10-26 NOTE — Telephone Encounter (Signed)
REFILL 

## 2015-10-30 ENCOUNTER — Other Ambulatory Visit: Payer: Self-pay | Admitting: Internal Medicine

## 2015-10-30 ENCOUNTER — Encounter: Payer: Self-pay | Admitting: Family Medicine

## 2015-10-30 DIAGNOSIS — H2512 Age-related nuclear cataract, left eye: Secondary | ICD-10-CM | POA: Diagnosis not present

## 2015-10-30 DIAGNOSIS — H25812 Combined forms of age-related cataract, left eye: Secondary | ICD-10-CM | POA: Diagnosis not present

## 2015-10-30 DIAGNOSIS — H2511 Age-related nuclear cataract, right eye: Secondary | ICD-10-CM | POA: Diagnosis not present

## 2015-10-31 ENCOUNTER — Ambulatory Visit: Payer: BLUE CROSS/BLUE SHIELD | Admitting: Family Medicine

## 2015-11-07 ENCOUNTER — Other Ambulatory Visit: Payer: Self-pay | Admitting: Internal Medicine

## 2015-11-10 ENCOUNTER — Ambulatory Visit (INDEPENDENT_AMBULATORY_CARE_PROVIDER_SITE_OTHER): Payer: BLUE CROSS/BLUE SHIELD | Admitting: Cardiovascular Disease

## 2015-11-10 ENCOUNTER — Encounter: Payer: Self-pay | Admitting: Cardiovascular Disease

## 2015-11-10 VITALS — BP 112/71 | HR 82 | Ht 64.0 in | Wt 151.6 lb

## 2015-11-10 DIAGNOSIS — I5042 Chronic combined systolic (congestive) and diastolic (congestive) heart failure: Secondary | ICD-10-CM

## 2015-11-10 DIAGNOSIS — Z7901 Long term (current) use of anticoagulants: Secondary | ICD-10-CM | POA: Insufficient documentation

## 2015-11-10 DIAGNOSIS — I480 Paroxysmal atrial fibrillation: Secondary | ICD-10-CM

## 2015-11-10 DIAGNOSIS — I25810 Atherosclerosis of coronary artery bypass graft(s) without angina pectoris: Secondary | ICD-10-CM

## 2015-11-10 DIAGNOSIS — E785 Hyperlipidemia, unspecified: Secondary | ICD-10-CM

## 2015-11-10 DIAGNOSIS — Z950 Presence of cardiac pacemaker: Secondary | ICD-10-CM | POA: Diagnosis not present

## 2015-11-10 DIAGNOSIS — E1159 Type 2 diabetes mellitus with other circulatory complications: Secondary | ICD-10-CM

## 2015-11-10 NOTE — Patient Instructions (Signed)
Dr Sallyanne Kuster recommends that you continue on your current medications as directed. Please refer to the Current Medication list given to you today.  Remote monitoring is used to monitor your Pacemaker of ICD from home. This monitoring reduces the number of office visits required to check your device to one time per year. It allows [place] to keep an eye on the functioning of your device to ensure it is working properly. You are scheduled for a device check from home on Friday, February 09, 2016. You may send your transmission at any time that day. If you have a wireless device, the transmission will be sent automatically. After your physician reviews your transmission, you will receive a postcard with your next transmission date.  Dr Sallyanne Kuster recommends that you schedule a follow-up appointment in 12 months with a pacemaker check. You will receive a reminder letter in the mail two months in advance. If you don't receive a letter, please call our office to schedule the follow-up appointment.  If you need a refill on your cardiac medications before your next appointment, please call your pharmacy.

## 2015-11-10 NOTE — Progress Notes (Signed)
Cardiology Office Note    Date:  11/10/2015   ID:  [name], DOB January 04, 1951, MRN [id number]  PCP:  Joycelyn Man, MD  Cardiologist:   [name], MD   Chief Complaint  Patient presents with  . Follow-up    pt c/o chest pain on 10/22/15, which she needed to take nitro for and also SOB     History of Present Illness:  [name] is a 65 y.o. female with a history of coronary artery disease s/p previous myocardial infarction and percutaneous revascularization with CABG performed in 2014 (Dr. Servando Snare, LIMA to LAD, SVG to ramus intermedius, sequential SVG to OM1 and OM 2, surgical maze procedure), severe ischemic cardiomyopathy and left bundle branch block with substantial improvement following CRT-P (St. Jude Allure, implanted 2014, Dr. Lovena Le), well compensated combined systolic and diastolic heart failure (CRT hyper-responder, EF 50-55% (February 2016), paroxysmal atrial fibrillation on chronic anticoagulation (Xarelto). Comorbid conditions include severe hypercholesterolemia intolerant to statin (currently on PACS canine inhibitor), diabetes mellitus type 2 requiring multiple agents and hypertension.  She returns today for routine yearly visit and pacemaker check. She feels great. She has not had any problems with exertional dyspnea or angina, leg edema, claudication, syncope, palpitations, focal neurological deficits or other cardiovascular complaints. She reports that her glycemic control has been good (last A1c 6.5% in June) and this tolerating the cholesterol lowering agents without side effects and with fair control of LDL cholesterol (LDL cholesterol 97 in July, was as low as 36 in January). Hernia surgery in March was uncomplicated.  Device interrogation shows normal function. Battery voltage suggests 6-7 years of remaining longevity, greater than 99% biventricular pacing, very rare atrial pacing, excellent lead parameters, a handful of episodes of atrial mode switch (all  under 1 minute in duration) and no episodes of ventricular tachycardia or ventricular fibrillation. The cyber security upgrade was performed.  Past Medical History:  Diagnosis Date  . AICD (automatic cardioverter/defibrillator) present 01/2013   Biventricular cardiac pacemaker in situ   . Allergic rhinitis   . CAD (coronary artery disease)    a. 2004: s/p MI in [place]. No PCI->Medical RX;  b. 07/2012 Cath: LM 30-40, LAD 70p, 70/51m D1 80-90p, OM1 small 90p, OM2 large 80-90p, 53m70-80d, RCA 20-30 diff, EF 40%, glob HK.s/p CABG  [facility]   Patient reports left breast cancer diagnosis in 2002 treated with bilateral mastectomy positive lymph nodes with left axillary dissection followed by chemotherapy of unknown type  . Cataract   . Diabetes mellitus type II   . Exertional shortness of breath   . Hyperlipidemia   . Hypertension   . Ischemic cardiomyopathy    a. 07/2012 Echo: EF 35%, Sev inferoseptal HK, mildly dil LA, Peak PASP 5994m.  . LMarland KitchenBB (left bundle branch block)    a. intermittent - present during rapid afib 07/2012.  . NMarland Kitchenuromuscular disorder ([facility]  Patient reports chronic numbness in the right foot related to previous surgery on the right leg and "nerve damage"  . PAF (paroxysmal atrial fibrillation) ([facility]  a. 07/2012: Amio and xarelto initiated.  . SBO (small bowel obstruction) ([facility]   Past Surgical History:  Procedure Laterality Date  . ABDOMINAL HYSTERECTOMY  2000  . APPENDECTOMY  1974  . BI-VENTRICULAR PACEMAKER INSERTION N/A 01/25/2013   Procedure: BI-VENTRICULAR PACEMAKER INSERTION (CRT-P);  Surgeon: GreEvans LanceD;  [facility];  Service: Cardiovascular;  Laterality: N/A;  . BREAST  BIOPSY Left 2002  . CARDIAC CATHETERIZATION    . CHOLECYSTECTOMY OPEN  1974  . CORONARY ARTERY BYPASS GRAFT N/A 09/28/2012   Procedure: CORONARY ARTERY BYPASS GRAFTING (CABG);  Surgeon: [name], MD;  Location: [place];  Service: Open  Heart Surgery;  Laterality: N/A;  CABG x four, using left internal mammary artery and left leg greater saphenous vein harvested endoscopically  . DILATION AND CURETTAGE OF UTERUS  1983  . EPICARDIAL PACING LEAD PLACEMENT N/A 09/28/2012   Procedure: EPICARDIAL PACING LEAD PLACEMENT;  Surgeon: [name], MD;  Location: [place];  Service: Thoracic;  Laterality: N/A;  LV LEAD PLACEMENT  . HERNIA REPAIR    . INCISIONAL HERNIA REPAIR N/A 05/25/2015   Procedure: LAPAROSCOPIC INCISIONAL HERNIA WITH MESH ;  Surgeon: [name], MD;  Location: [place];  Service: General;  Laterality: N/A;  . INCONTINENCE SURGERY  2000  . INSERTION OF MESH N/A 05/25/2015   Procedure: INSERTION OF MESH;  Surgeon: [name], MD;  Location: [place];  Service: General;  Laterality: N/A;  . INTRAOPERATIVE TRANSESOPHAGEAL ECHOCARDIOGRAM N/A 09/28/2012   Procedure: INTRAOPERATIVE TRANSESOPHAGEAL ECHOCARDIOGRAM;  Surgeon: [name], MD;  Location: [place];  Service: Open Heart Surgery;  Laterality: N/A;  . LAPAROSCOPIC INCISIONAL / UMBILICAL / VENTRAL HERNIA REPAIR  05/25/2015   IHR  . LEFT HEART CATHETERIZATION WITH CORONARY ANGIOGRAM N/A 07/20/2012   Procedure: LEFT HEART CATHETERIZATION WITH CORONARY ANGIOGRAM;  Surgeon: [name], MD;  [facility];  Service: Cardiovascular;  Laterality: N/A;  . MASTECTOMY Right 2002  . MASTECTOMY MODIFIED RADICAL W/ AXILLARY LYMPH NODES W/ OR W/O PECTORALIS MINOR Left 2002  . MAZE N/A 09/28/2012   Procedure: MAZE;  Surgeon: [name], MD;  Location: [place];  Service: Open Heart Surgery;  Laterality: N/A;  . TENDON REPAIR Right 2001 X 3-4   torn ligaments and tendons in ankle up to knee from work related accident  . TUBAL LIGATION  1987    Current Medications: Outpatient Medications Prior to Visit  Medication Sig Dispense Refill  . aspirin 81 MG tablet Take 1 tablet (81 mg total) by mouth daily. 30 tablet   . BAYER MICROLET LANCETS lancets Use as  instructed 100 each 2  . Blood Glucose Monitoring Suppl (BAYER CONTOUR NEXT LINK) w/Device KIT Use to check blood sugar 2 times per day. 1 kit 2  . carvedilol (COREG) 25 MG tablet Take 0.5 tablets (12.5 mg total) by mouth 2 (two) times daily. 60 tablet 2  . Evolocumab (REPATHA SURECLICK) 342 MG/ML SOAJ Inject 140 mg into the skin every 14 (fourteen) days. 2 pen 5  . furosemide (LASIX) 40 MG tablet TAKE 1 TABLET (40 MG TOTAL) BY MOUTH DAILY. 90 tablet 3  . glipiZIDE (GLUCOTROL) 5 MG tablet TAKE 1/2 TABLET BY MOUTH TWICE DAILY BEFORE A MEAL 28 tablet 1  . INVOKANA 100 MG TABS tablet TAKE ONE TABLET BY MOUTH EVERY MORNING 30 tablet 0  . losartan (COZAAR) 25 MG tablet TAKE 1 TABLET (25 MG TOTAL) BY MOUTH 2 (TWO) TIMES DAILY. 180 tablet 0  . metFORMIN (GLUCOPHAGE) 1000 MG tablet TAKE 1 TABLET (1,000 MG TOTAL) BY MOUTH 2 (TWO) TIMES DAILY WITH A MEAL. 60 tablet 0  . nitroGLYCERIN (NITROSTAT) 0.4 MG SL tablet Place 1 tablet (0.4 mg total) under the tongue every 5 (five) minutes as needed for chest pain. 25 tablet 3  . rivaroxaban (XARELTO) 20 MG TABS tablet Take 1 tablet (20 mg total)  by mouth daily with supper. 30 tablet 5  . TRULICITY 1.5 FV/4.9SW SOPN INJECT 1.5 MG INTO THE SKIN ONCE A WEEK.   NEED FOLLOW UP FOR FUTURE REFILLS 4 pen 5  . glucose blood (BAYER CONTOUR NEXT TEST) test strip Use to check blood sugar 2 times per day. (Patient not taking: Reported on 11/10/2015) 150 each 2  . glucose blood test strip Use to check blood sugar 2 times per day. (Patient not taking: Reported on 11/10/2015) 150 each 2  . HYDROcodone-acetaminophen (NORCO/VICODIN) 5-325 MG tablet   0  . oxyCODONE-acetaminophen (ROXICET) 5-325 MG tablet Take 1 tablet by mouth at bedtime as needed for moderate pain or severe pain. (Patient not taking: Reported on 11/10/2015) 30 tablet 0   No facility-administered medications prior to visit.      Allergies:   Statins   Social History   Social History  . Marital status: Married     Spouse name: N/A  . Number of children: 3  . 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   Social History Main Topics  . Smoking status: Former Smoker    Packs/day: 1.00    Years: 10.00    Types: Cigarettes    Quit date: 03/11/2012  . Smokeless tobacco: Never Used  . Alcohol use No  . Drug use: No  . Sexual activity: Yes    Partners: Female   Other Topics Concern  . None   Social History Narrative   Lives with husband and mother in [place].     Regular exercise: walking   Caffeine use: 2 cups of coffee in the morning     Family History:  The patient's family history includes Arthritis in her other; Cancer in her other; Colon cancer in her sister; Diabetes in her other; Heart disease in her mother; Hyperlipidemia in her other; Kidney failure in her mother and other.   ROS:   Please see the history of present illness.    ROS All other systems reviewed and are negative.   PHYSICAL EXAM:   VS:  BP 112/71 (BP Location: Right Arm, Patient Position: Sitting, Cuff Size: Normal)   Pulse 82   Ht 5' 4"  (1.626 m)   Wt 151 lb 9.6 oz (68.8 kg)   SpO2 97%   BMI 26.02 kg/m    GEN: Well nourished, well developed, in no acute distress  HEENT: normal  Neck: no JVD, carotid bruits, or masses Cardiac: RRR; no murmurs, rubs, or gallops,no edema , healthy left subclavian pacemaker site Respiratory:  clear to auscultation bilaterally, normal work of breathing GI: soft, nontender, nondistended, + BS MS: no deformity or atrophy  Skin: warm and dry, no rash Neuro:  Alert and Oriented x 3, Strength and sensation are intact Psych: euthymic mood, full affect  Wt Readings from Last 3 Encounters:  11/10/15 151 lb 9.6 oz (68.8 kg)  10/03/15 151 lb (68.5 kg)  10/03/15 151 lb (68.5 kg)      Studies/Labs Reviewed:   EKG:  EKG is ordered today.  The ekg ordered today demonstrates Atrial sensed, biventricular paced rhythm with positive R wave in leads V1, QTc  522 ms  Recent Labs: 04/10/2015: TSH 1.05 08/11/2015: BUN 18; Creat 0.72; Hemoglobin 12.1; Platelets 266; Potassium 4.2; Sodium 138 09/15/2015: ALT 18   Lipid Panel    Component Value Date/Time   CHOL 195 09/15/2015 0807   TRIG 214 (H) 09/15/2015 0807   HDL 55 09/15/2015 0807  CHOLHDL 3.5 09/15/2015 0807   VLDL 43 (H) 09/15/2015 0807   LDLCALC 97 09/15/2015 0807   LDLDIRECT 134.3 09/19/2009 1436     ASSESSMENT:    1. Chronic combined systolic and diastolic CHF, NYHA class 2 ([place])   2. Coronary artery disease involving coronary bypass graft of native heart without angina pectoris   3. PAF (paroxysmal atrial fibrillation) ([place])   4. Long term current use of anticoagulant   5. Type 2 diabetes mellitus with other circulatory complication, without long-term current use of insulin ([place])   6. Hyperlipidemia   7. Biventricular cardiac pacemaker - St Jude, Nov 2014      PLAN:  In order of problems listed above:  1. CHF: Well compensated, NYHA functional class I, euvolemic. Brief dip in thoracic impedance in early August was self-limited and not associated with clinical signs of heart failure. Most recent EF around 50% following resynchronization. On angiotensin receptor blocker and carvedilol. 2. CAD s/p CABG: Angina free, asymptomatic 3. AFib: No recurrence in at least a year. She had a surgical maze procedure. She is currently not on antiarrhythmic therapy. Continue anticoagulation. CHADSVasc 5 (gender, hypertension, diabetes, CAD, CHF). 4. No bleeding complication on Xarelto. 5. DM: good control 6. HLP: Good response to PCS K9 7. CRT-P: Normal device function. Cybersecurity upgrade performed. He has an epicardial LV pacing lead was placed at the time of bypass surgery.    Medication Adjustments/Labs and Tests Ordered: Current medicines are reviewed at length with the patient today.  Concerns regarding medicines are outlined above.  Medication changes, Labs and Tests ordered today  are listed in the Patient Instructions below. Patient Instructions  Dr Sallyanne Kuster recommends that you continue on your current medications as directed. Please refer to the Current Medication list given to you today.  Remote monitoring is used to monitor your Pacemaker of ICD from home. This monitoring reduces the number of office visits required to check your device to one time per year. It allows [place] to keep an eye on the functioning of your device to ensure it is working properly. You are scheduled for a device check from home on Friday, February 09, 2016. You may send your transmission at any time that day. If you have a wireless device, the transmission will be sent automatically. After your physician reviews your transmission, you will receive a postcard with your next transmission date.  Dr Sallyanne Kuster recommends that you schedule a follow-up appointment in 12 months with a pacemaker check. You will receive a reminder letter in the mail two months in advance. If you don't receive a letter, please call our office to schedule the follow-up appointment.  If you need a refill on your cardiac medications before your next appointment, please call your pharmacy.    Signed, [name], MD  11/10/2015 4:38 PM    [address], [place], [place]  [address] Phone: [phone]; Fax: [phone]

## 2015-11-22 ENCOUNTER — Encounter: Payer: Self-pay | Admitting: Gastroenterology

## 2015-11-22 LAB — CUP PACEART INCLINIC DEVICE CHECK
Implantable Lead Implant Date: 20140721
Implantable Lead Implant Date: 20141117
Implantable Lead Location: 753858
Implantable Lead Location: 753859
Implantable Lead Model: 5071
Lead Channel Setting Pacing Amplitude: 2 V
Lead Channel Setting Pacing Amplitude: 2.25 V
Lead Channel Setting Pacing Pulse Width: 0.4 ms
MDC IDC LEAD IMPLANT DT: 20141117
MDC IDC LEAD LOCATION: 753860
MDC IDC SESS DTM: 20170913140618
MDC IDC SET LEADCHNL LV PACING PULSEWIDTH: 0.5 ms
MDC IDC SET LEADCHNL RV PACING AMPLITUDE: 2.5 V
MDC IDC SET LEADCHNL RV SENSING SENSITIVITY: 2 mV
Pulse Gen Serial Number: 2981305

## 2015-12-01 ENCOUNTER — Other Ambulatory Visit: Payer: Self-pay | Admitting: General Surgery

## 2015-12-01 DIAGNOSIS — K760 Fatty (change of) liver, not elsewhere classified: Secondary | ICD-10-CM

## 2015-12-06 DIAGNOSIS — E113492 Type 2 diabetes mellitus with severe nonproliferative diabetic retinopathy without macular edema, left eye: Secondary | ICD-10-CM | POA: Diagnosis not present

## 2015-12-06 DIAGNOSIS — H43822 Vitreomacular adhesion, left eye: Secondary | ICD-10-CM | POA: Diagnosis not present

## 2015-12-06 DIAGNOSIS — H3561 Retinal hemorrhage, right eye: Secondary | ICD-10-CM | POA: Diagnosis not present

## 2015-12-06 DIAGNOSIS — H43821 Vitreomacular adhesion, right eye: Secondary | ICD-10-CM | POA: Diagnosis not present

## 2015-12-06 DIAGNOSIS — E113391 Type 2 diabetes mellitus with moderate nonproliferative diabetic retinopathy without macular edema, right eye: Secondary | ICD-10-CM | POA: Diagnosis not present

## 2015-12-07 ENCOUNTER — Other Ambulatory Visit: Payer: BLUE CROSS/BLUE SHIELD

## 2015-12-10 ENCOUNTER — Other Ambulatory Visit: Payer: Self-pay | Admitting: Internal Medicine

## 2015-12-12 ENCOUNTER — Other Ambulatory Visit: Payer: Self-pay

## 2015-12-12 ENCOUNTER — Telehealth: Payer: Self-pay | Admitting: Internal Medicine

## 2015-12-12 MED ORDER — CANAGLIFLOZIN 100 MG PO TABS
100.0000 mg | ORAL_TABLET | Freq: Every morning | ORAL | 2 refills | Status: DC
Start: 2015-12-12 — End: 2016-03-12

## 2015-12-12 NOTE — Telephone Encounter (Signed)
Pt called and requests a refill of Invokana be sent to the [facility] on [address].

## 2015-12-14 ENCOUNTER — Other Ambulatory Visit: Payer: Self-pay

## 2015-12-25 ENCOUNTER — Other Ambulatory Visit: Payer: Self-pay | Admitting: Internal Medicine

## 2016-01-04 ENCOUNTER — Ambulatory Visit: Payer: BLUE CROSS/BLUE SHIELD | Admitting: Internal Medicine

## 2016-01-08 ENCOUNTER — Encounter: Payer: Self-pay | Admitting: Family Medicine

## 2016-01-09 ENCOUNTER — Telehealth: Payer: Self-pay | Admitting: Cardiovascular Disease

## 2016-01-09 ENCOUNTER — Encounter: Payer: Self-pay | Admitting: Cardiovascular Disease

## 2016-01-09 NOTE — Telephone Encounter (Signed)
New Message  Request for surgical clearance:  1. What type of surgery is being performed? Extraction  2. When is this surgery scheduled? Dont know as of yet possibly today  3. Are there any medications that need to be held prior to surgery and how long? Xarelto  4. Name of physician performing surgery? Dr. Mathis Fare  5. What is your office phone and fax number? [phone], fax [phone]

## 2016-01-09 NOTE — Telephone Encounter (Signed)
Will forward to dr croitoru for review and advise

## 2016-01-09 NOTE — Telephone Encounter (Signed)
Letter in [place]. Dr. Gloriann Loan not in [place], please fax. Thank you

## 2016-01-10 NOTE — Telephone Encounter (Signed)
Clearance letter faxed to the number provided.

## 2016-01-11 ENCOUNTER — Other Ambulatory Visit: Payer: Self-pay | Admitting: Cardiovascular Disease

## 2016-01-21 ENCOUNTER — Other Ambulatory Visit: Payer: Self-pay | Admitting: Internal Medicine

## 2016-01-28 ENCOUNTER — Other Ambulatory Visit: Payer: Self-pay | Admitting: Cardiovascular Disease

## 2016-01-29 ENCOUNTER — Encounter: Payer: Self-pay | Admitting: Gastroenterology

## 2016-01-29 ENCOUNTER — Ambulatory Visit: Payer: BLUE CROSS/BLUE SHIELD | Admitting: Gastroenterology

## 2016-01-30 ENCOUNTER — Ambulatory Visit (INDEPENDENT_AMBULATORY_CARE_PROVIDER_SITE_OTHER): Payer: BLUE CROSS/BLUE SHIELD | Admitting: Family Medicine

## 2016-01-30 ENCOUNTER — Encounter: Payer: Self-pay | Admitting: Family Medicine

## 2016-01-30 VITALS — BP 118/64 | HR 81 | Temp 98.8°F | Resp 12 | Ht 64.0 in | Wt 146.0 lb

## 2016-01-30 DIAGNOSIS — J069 Acute upper respiratory infection, unspecified: Secondary | ICD-10-CM | POA: Diagnosis not present

## 2016-01-30 MED ORDER — DOXYCYCLINE HYCLATE 100 MG PO TABS: 100.0000 mg | ORAL_TABLET | Freq: Two times a day (BID) | ORAL | 0 refills | Status: AC

## 2016-01-30 NOTE — Progress Notes (Signed)
HPI:  ACUTE VISIT:  Chief Complaint  Patient presents with  . URI    [name] is a 65 y.o. female, who is here today complaining of 3-4 days of respiratory symptoms.    + Nasal congestion, rhinorrhea, sore throat, and post nasal drainage. Last night fever 101.1 F and productive cough with yellowish/clear sputum. No chills or body aches.  No Hx of recent travel. Sick contact:No No known insect bite. + Hx of allergies.  Medication OTC for this problem: Tylenol, last taken today morning.  Hx of DM II, BS are "normal."  Symptoms otherwise stable.    Review of Systems  Constitutional: Positive for fatigue and fever. Negative for activity change, appetite change and chills.  HENT: Positive for congestion, postnasal drip, sneezing and sore throat. Negative for ear pain, mouth sores, nosebleeds, sinus pressure, trouble swallowing and voice change.   Eyes: Negative for discharge, redness and itching.  Respiratory: Positive for cough. Negative for shortness of breath and wheezing.   Gastrointestinal: Negative for abdominal pain, diarrhea, nausea and vomiting.  Musculoskeletal: Negative for myalgias and neck pain.  Skin: Negative for rash.  Allergic/Immunologic: Positive for environmental allergies.  Neurological: Negative for syncope, weakness and headaches.  Hematological: Negative for adenopathy. Does not bruise/bleed easily.      Current Outpatient Prescriptions on File Prior to Visit  Medication Sig Dispense Refill  . aspirin 81 MG tablet Take 1 tablet (81 mg total) by mouth daily. 30 tablet   . BAYER MICROLET LANCETS lancets Use as instructed 100 each 2  . Blood Glucose Monitoring Suppl (BAYER CONTOUR NEXT LINK) w/Device KIT Use to check blood sugar 2 times per day. 1 kit 2  . canagliflozin (INVOKANA) 100 MG TABS tablet Take 1 tablet (100 mg total) by mouth every morning. 30 tablet 2  . carvedilol (COREG) 25 MG tablet Take 0.5 tablets (12.5 mg total) by  mouth 2 (two) times daily. 60 tablet 2  . Evolocumab (REPATHA SURECLICK) 720 MG/ML SOAJ Inject 140 mg into the skin every 14 (fourteen) days. 2 pen 5  . furosemide (LASIX) 40 MG tablet TAKE 1 TABLET (40 MG TOTAL) BY MOUTH DAILY. 90 tablet 3  . glipiZIDE (GLUCOTROL) 5 MG tablet TAKE ONE-HALF TABLET BY MOUTH TWO TIMES A DAY BEFORE A MEAL 28 tablet 0  . losartan (COZAAR) 25 MG tablet TAKE 1 TABLET (25 MG TOTAL) BY MOUTH 2 (TWO) TIMES DAILY. 180 tablet 0  . metFORMIN (GLUCOPHAGE) 1000 MG tablet TAKE 1 TABLET (1,000 MG TOTAL) BY MOUTH 2 (TWO) TIMES DAILY WITH A MEAL. 60 tablet 0  . nitroGLYCERIN (NITROSTAT) 0.4 MG SL tablet Place 1 tablet (0.4 mg total) under the tongue every 5 (five) minutes as needed for chest pain. 25 tablet 3  . TRULICITY 1.5 NO/7.0JG SOPN INJECT 1.5 MG INTO THE SKIN ONCE A WEEK.   NEED FOLLOW UP FOR FUTURE REFILLS 4 pen 5  . XARELTO 20 MG TABS tablet TAKE 1 TABLET (20 MG TOTAL) BY MOUTH DAILY WITH SUPPER. 30 tablet 5   No current facility-administered medications on file prior to visit.      Past Medical History:  Diagnosis Date  . AICD (automatic cardioverter/defibrillator) present 01/2013   Biventricular cardiac pacemaker in situ   . Allergic rhinitis   . CAD (coronary artery disease)    a. 2004: s/p MI in [place]. No PCI->Medical RX;  b. 07/2012 Cath: LM 30-40, LAD 70p, 70/29m D1 80-90p, OM1 small 90p, OM2 large 80-90p, 572m70-80d,  RCA 20-30 diff, EF 40%, glob HK.s/p CABG  . Cancer of left breast [facility]) 2002   Patient reports left breast cancer diagnosis in 2002 treated with bilateral mastectomy positive lymph nodes with left axillary dissection followed by chemotherapy of unknown type  . Cataract   . Diabetes mellitus type II   . Exertional shortness of breath   . Hyperlipidemia   . Hypertension   . Ischemic cardiomyopathy    a. 07/2012 Echo: EF 35%, Sev inferoseptal HK, mildly dil LA, Peak PASP 96mHg.  .Marland KitchenLBBB (left bundle branch block)    a. intermittent - present  during rapid afib 07/2012.  .Marland KitchenNeuromuscular disorder ([place]    Patient reports chronic numbness in the right foot related to previous surgery on the right leg and "nerve damage"  . PAF (paroxysmal atrial fibrillation) ([place]    a. 07/2012: Amio and xarelto initiated.  . SBO (small bowel obstruction)    Allergies  Allergen Reactions  . Statins Other (See Comments)    Myalgias with rosuvastatin, atorvastatin and simvastatin     Social History   Social History  . Marital status: Married    Spouse name: N/A  . Number of children: 3  . 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   Social History Main Topics  . Smoking status: Former Smoker    Packs/day: 1.00    Years: 10.00    Types: Cigarettes    Quit date: 03/11/2012  . Smokeless tobacco: Never Used  . Alcohol use No  . Drug use: No  . Sexual activity: Yes    Partners: Female   Other Topics Concern  . None   Social History Narrative   Lives with husband and mother in [place] [place]     Regular exercise: walking   Caffeine use: 2 cups of coffee in the morning    Vitals:   01/30/16 1520  BP: 118/64  Pulse: 81  Resp: 12  Temp: 98.8 F (37.1 C)   O2 sat 95% at RA.  Body mass index is 25.06 kg/m.   Physical Exam  Constitutional: She is oriented to person, place, and time. She appears well-developed. She does not appear ill. No distress.  HENT:  Head: Atraumatic.  Right Ear: Tympanic membrane, external ear and ear canal normal.  Left Ear: Tympanic membrane, external ear and ear canal normal.  Nose: Rhinorrhea present. Right sinus exhibits no maxillary sinus tenderness and no frontal sinus tenderness. Left sinus exhibits no maxillary sinus tenderness and no frontal sinus tenderness.  Mouth/Throat: Uvula is midline and mucous membranes are normal. Posterior oropharyngeal erythema present. No oropharyngeal exudate or posterior oropharyngeal edema.  + Post nasal drainage and hypertrophic  turbinates.  Eyes: Conjunctivae are normal.  Cardiovascular: Normal rate and regular rhythm.   Respiratory: Effort normal and breath sounds normal. No stridor. No respiratory distress. She has no wheezes. She has no rales.  Lymphadenopathy:       Head (right side): No submandibular adenopathy present.       Head (left side): No submandibular adenopathy present.    She has cervical adenopathy.       Right cervical: Posterior cervical adenopathy present.       Left cervical: Posterior cervical adenopathy present.  Neurological: She is alert and oriented to person, place, and time. She has normal strength.  Skin: Skin is warm. No rash noted. No erythema.  Psychiatric: She has a normal mood and affect. Her speech is  normal.  Well groomed, good eye contact.      ASSESSMENT AND PLAN:     Sintia was seen today for uri.  Diagnoses and all orders for this visit:  URI, acute  Other orders -     doxycycline (VIBRA-TABS) 100 MG tablet; Take 1 tablet (100 mg total) by mouth 2 (two) times daily.   Symptoms suggests a viral etiology, I explained patient that symptomatic treatment is usually recommended in this case, so I do not think abx is needed at this time. I did send a Rx for Doxycycline that she can pick up in 3-4 days if still febrile or if worsening symptoms. Instructed to monitor for signs of complications, clearly instructed about warning signs. I also explained that cough and nasal congestion can last a few days and sometimes weeks. F/U as needed.    Return if symptoms worsen or fail to improve.     -[name] was advised to return or notify a doctor immediately if symptoms worsen or persist or new concerns arise.       Cari Burgo G. Martinique, MD  [facility]. [place] office.

## 2016-01-30 NOTE — Patient Instructions (Addendum)
A few things to remember from today's visit:   URI, acute   Most likely vital.    Viral infections are self-limited and we treat each symptom depending of severity.  Over the counter medications as decongestants and cold medications usually help, they need to be taken with caution if there is a history of high blood pressure or palpitations; SO no recommended given your history of blood pressure and heart disease.   Tylenol also helps with most symptoms (headache, muscle aching, fever,etc) Plenty of fluids. Honey helps with cough. Steam inhalations helps with runny nose, nasal congestion, and may prevent sinus infections. Cough and nasal congestion could last a few days and sometimes weeks.  If still fever in 3-4 days or worsening symptoms start pick up and start antibiotic.  If sudden worsening, difficulty breathing, chest pain please seek immediate medical attention.  Please follow in not any better in 1-2 weeks or if symptoms get worse.

## 2016-02-09 ENCOUNTER — Ambulatory Visit (INDEPENDENT_AMBULATORY_CARE_PROVIDER_SITE_OTHER): Payer: BLUE CROSS/BLUE SHIELD | Admitting: *Deleted

## 2016-02-09 DIAGNOSIS — Z950 Presence of cardiac pacemaker: Secondary | ICD-10-CM | POA: Diagnosis not present

## 2016-02-09 NOTE — Progress Notes (Signed)
Remote pacemaker transmission.   

## 2016-02-20 ENCOUNTER — Encounter: Payer: Self-pay | Admitting: Cardiology

## 2016-02-20 ENCOUNTER — Other Ambulatory Visit: Payer: Self-pay | Admitting: Internal Medicine

## 2016-02-22 ENCOUNTER — Ambulatory Visit (INDEPENDENT_AMBULATORY_CARE_PROVIDER_SITE_OTHER): Payer: BLUE CROSS/BLUE SHIELD | Admitting: Internal Medicine

## 2016-02-22 VITALS — BP 112/62 | HR 83 | Ht 64.0 in | Wt 150.6 lb

## 2016-02-22 DIAGNOSIS — E1159 Type 2 diabetes mellitus with other circulatory complications: Secondary | ICD-10-CM

## 2016-02-22 DIAGNOSIS — E559 Vitamin D deficiency, unspecified: Secondary | ICD-10-CM

## 2016-02-22 HISTORY — DX: Vitamin D deficiency, unspecified: E55.9

## 2016-02-22 LAB — POCT GLYCOSYLATED HEMOGLOBIN (HGB A1C): HEMOGLOBIN A1C: 6.5

## 2016-02-22 LAB — VITAMIN D 25 HYDROXY (VIT D DEFICIENCY, FRACTURES): VITD: 38.79 ng/mL (ref 30.00–100.00)

## 2016-02-22 NOTE — Patient Instructions (Addendum)
Please stop at the lab.  Continue vitamin D 5000 units daily.  Please continue: - Metformin 2000 mg with dinner - Glipizide 2.5 mg in am and 2.5 before dinner - Invokana [id number] mg in am - Trulicity 1.5 mg daily weekly  Please come back for a follow-up appointment in 3 months

## 2016-02-22 NOTE — Progress Notes (Signed)
Patient ID: [name], female   DOB: 1950/05/17, 65 y.o.   MRN: [id number]  HPI: [name] is a 65 y.o.-year-old female, returning for f/u for DM2, dx 2002, insulin-independent, uncontrolled, with complications (CAD, s/p CABG). Last visit 5 months ago.  Last hemoglobin A1c was: Lab Results  Component Value Date   HGBA1C 6.5 (H) 08/11/2015   HGBA1C 7.7 (H) 04/10/2015   HGBA1C 6.7 09/22/2014  Patient has been on insulin before >> hypoglycemia.   Pt is on a regimen of: - Metformin 2000 mg with dinner - Glipizide 10 mg bid >> 10 mg in am and 5 mg in pm >> 5 mg bid >> 2.5 mg bid - Invokana 100 mg (started 10/2013) - no yeast inf or UTIs. - Bydureon 2 mg weekly - started 123456 >> Trulicity 1.5 mg weekly Could not afford Januvia 100 mg daily >> 100$ reduced from 348%. She was on Amaryl 2 mg bid.  Pt checks her sugars 1x a day and they are higher in am: - am: 170-200 >> 170-233 >> 135-140 >> 120-150 >> 139-187 >> 119-151, 185 - before lunch: 130-220 >> 80-120 >> 80-120 >> 108-110 >> 93, 122 - after lunch: 130-140 >> 92-157 >> 101-106 - before dinner: 100-150 >> 98-161 >> 115-130 >> 50, 70-120, 140 >> 88-190 >> 108-117 - bedtime: 80-110 >> 60-112 >> n/c >> 63, 81 >> 100 >> 100-120, 170 if has fruit >> n/c >> 101-139 Still has lows. Lowest sugar was 74 >> 63 after dinner x1 >> 50s >> 90 >> 93; she has hypoglycemia awareness at 60.  Highest sugar was 233 >> <200 >> 190 >> 185.  - no CKD, last BUN/creatinine:  Lab Results  Component Value Date   BUN 18 08/11/2015   CREATININE 0.72 08/11/2015  She is on losartan. - last set of lipids: Lab Results  Component Value Date   CHOL 195 09/15/2015   HDL 55 09/15/2015   LDLCALC 97 09/15/2015   LDLDIRECT 134.3 09/19/2009   TRIG 214 (H) 09/15/2015   CHOLHDL 3.5 09/15/2015  She is on Repatha q2 week >> started in 10/2015. She has an allergy to statins. - last eye exam was 09/2015. Has cataracts - had surgery in 10/2015. No DR.  - no  numbness and tingling in her feet. She is on aspirin 81.  Her vitamin D level was also low, so we started 5000 units vitamin D daily: Lab Results  Component Value Date   VD25OH 12.35 (L) 10/10/2015   ROS: Constitutional: no weight gain/loss, no fatigue, no subjective hyperthermia/hypothermia Eyes: no blurry vision, no xerophthalmia ENT: no sore throat, no nodules palpated in throat, no dysphagia/odynophagia, no hoarseness, + congestion Cardiovascular: no CP/SOB/no palpitations/+ leg swelling Respiratory: no cough/SOB Gastrointestinal: no N/V/D/C Musculoskeletal: + muscle/+ joint aches Skin: no rashes Neurological: no tremors/numbness/tingling/dizziness  I reviewed pt's medications, allergies, PMH, social hx, family hx, and changes were documented in the history of present illness. Otherwise, unchanged from my initial visit note.  PE: Pulse 83   Ht 5\' 4"  (1.626 m)   Wt 150 lb 9.6 oz (68.3 kg)   SpO2 96%   BMI 25.85 kg/m  Body mass index is 25.85 kg/m.  Wt Readings from Last 3 Encounters:  02/22/16 150 lb 9.6 oz (68.3 kg)  01/30/16 146 lb (66.2 kg)  11/10/15 151 lb 9.6 oz (68.8 kg)   Constitutional: normal weight, in NAD Eyes: PERRLA, EOMI, no exophthalmos ENT: moist mucous membranes, no thyromegaly, no cervical lymphadenopathy Cardiovascular: RRR,  1/6 SEM, no RG, no leg edema Respiratory: CTA B Gastrointestinal: abdomen soft, NT, ND, BS+ Musculoskeletal: no deformities, strength intact in all 4 Skin: moist, warm, no rashes Neurological: no tremor with outstretched hands, DTR normal in all 4  ASSESSMENT: 1. 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 Vitamin D deficiency  PLAN:  1. Patient with long-standing, recently more controlled diabetes, on oral antidiabetic regimen. Last HbA1c is great at 6.5%! She had no more lows after we  decreased Glipizide further to 2.5 mg bid. At last visit, as she had higher sugars in am >> we moved all Metformin at dinnertime. Sugars are better in am >> continue current regimen. - I advised her to: Patient Instructions  Please continue: - Metformin 2000 mg with dinner - Glipizide 2.5 mg in am and 2.5 before dinner - Invokana 123XX123 mg in am  - Trulicity 1.5 mg daily weekly  Please come back for a follow-up appointment in 3 months  - continue checking her sugars at different times of the day - check 2 times a day, rotating checks  - UTD with exams >> Dr Bing Plume - Return to clinic in 3 mo with sugar log  2. Vitamin D deficiency - Previous vit D level was very low: Lab Results  Component Value Date   VD25OH 12.35 (L) 10/10/2015  - continue 5000 units vitamin D3 daily. - Recheck level today  Office Visit on 02/22/2016  Component Date Value Ref Range Status  . Hemoglobin A1C 02/22/2016 6.5   Final  . VITD 02/22/2016 38.79  30.00 - 100.00 ng/mL Final   Vitamin D level normal. We'll continue the current dose of vitamin D supplement.  Philemon Kingdom, MD PhD [facility] Endocrinology

## 2016-02-23 ENCOUNTER — Encounter: Payer: Self-pay | Admitting: Internal Medicine

## 2016-02-27 ENCOUNTER — Encounter: Payer: Self-pay | Admitting: *Deleted

## 2016-02-27 ENCOUNTER — Telehealth: Payer: Self-pay | Admitting: Cardiovascular Disease

## 2016-02-27 NOTE — Telephone Encounter (Signed)
Left message to call back  

## 2016-02-27 NOTE — Telephone Encounter (Signed)
°  New Prob   Pt has some questions regarding her Repatha injection medication. Please call.

## 2016-02-27 NOTE — Telephone Encounter (Signed)
Will forward to Pharm D, patient request speaking with one of them  Ok to call back tomorrow

## 2016-02-28 NOTE — Telephone Encounter (Signed)
Pt calling to have lab orders mailed to her so that she can have lab tests done by primary care. Will put orders in mail today.

## 2016-03-01 ENCOUNTER — Encounter: Payer: Self-pay | Admitting: Family Medicine

## 2016-03-01 ENCOUNTER — Encounter: Payer: Self-pay | Admitting: Internal Medicine

## 2016-03-08 ENCOUNTER — Other Ambulatory Visit: Payer: Self-pay | Admitting: Internal Medicine

## 2016-03-12 ENCOUNTER — Telehealth: Payer: Self-pay | Admitting: Internal Medicine

## 2016-03-12 ENCOUNTER — Telehealth: Payer: Self-pay

## 2016-03-12 MED ORDER — DULAGLUTIDE 1.5 MG/0.5ML ~~LOC~~ SOAJ: SUBCUTANEOUS | 5 refills | Status: DC

## 2016-03-12 MED ORDER — CANAGLIFLOZIN 100 MG PO TABS: 100.0000 mg | ORAL_TABLET | Freq: Every morning | ORAL | 2 refills | Status: DC

## 2016-03-12 MED ORDER — GLIPIZIDE 5 MG PO TABS: ORAL_TABLET | ORAL | 0 refills | Status: DC

## 2016-03-12 MED ORDER — METFORMIN HCL 1000 MG PO TABS: ORAL_TABLET | ORAL | 0 refills | Status: DC

## 2016-03-12 MED ORDER — BAYER MICROLET LANCETS MISC: 2 refills | Status: DC

## 2016-03-12 NOTE — Telephone Encounter (Signed)
Patient need all her diabetic medications to be updated with refills, She didn't know which ones because didn't have her list with her. She aslo asked for a call back.

## 2016-03-12 NOTE — Telephone Encounter (Signed)
Called patient back, left message advising of medications sent to the pharmacy and advised to call back if any questions. Gave call back number.

## 2016-03-14 LAB — CUP PACEART REMOTE DEVICE CHECK
Battery Remaining Longevity: 80 mo
Battery Remaining Percentage: 95.5 %
Brady Statistic AP VP Percent: 1 %
Brady Statistic AP VS Percent: 1 %
Brady Statistic AS VP Percent: 99 %
Brady Statistic AS VS Percent: 1 %
Brady Statistic RA Percent Paced: 1 %
Implantable Lead Location: 753858
Implantable Lead Location: 753860
Lead Channel Impedance Value: 400 Ohm
Lead Channel Impedance Value: 400 Ohm
Lead Channel Impedance Value: 490 Ohm
Lead Channel Pacing Threshold Amplitude: 1 V
Lead Channel Pacing Threshold Amplitude: 1.25 V
Lead Channel Pacing Threshold Pulse Width: 0.4 ms
Lead Channel Sensing Intrinsic Amplitude: 12 mV
Lead Channel Setting Pacing Amplitude: 2.25 V
Lead Channel Setting Pacing Pulse Width: 0.4 ms
Lead Channel Setting Sensing Sensitivity: 2 mV
MDC IDC LEAD IMPLANT DT: 20140721
MDC IDC LEAD IMPLANT DT: 20141117
MDC IDC LEAD IMPLANT DT: 20141117
MDC IDC LEAD LOCATION: 753859
MDC IDC MSMT BATTERY VOLTAGE: 2.96 V
MDC IDC MSMT LEADCHNL LV PACING THRESHOLD PULSEWIDTH: 0.5 ms
MDC IDC MSMT LEADCHNL RA PACING THRESHOLD AMPLITUDE: 0.75 V
MDC IDC MSMT LEADCHNL RA SENSING INTR AMPL: 1 mV
MDC IDC MSMT LEADCHNL RV PACING THRESHOLD PULSEWIDTH: 0.4 ms
MDC IDC PG IMPLANT DT: 20141117
MDC IDC PG SERIAL: 2981305
MDC IDC SESS DTM: 20171201070015
MDC IDC SET LEADCHNL LV PACING PULSEWIDTH: 0.5 ms
MDC IDC SET LEADCHNL RA PACING AMPLITUDE: 2 V
MDC IDC SET LEADCHNL RV PACING AMPLITUDE: 2.5 V

## 2016-03-17 ENCOUNTER — Other Ambulatory Visit: Payer: Self-pay | Admitting: Family Medicine

## 2016-03-19 ENCOUNTER — Other Ambulatory Visit: Payer: Self-pay | Admitting: Internal Medicine

## 2016-03-20 ENCOUNTER — Other Ambulatory Visit: Payer: Self-pay | Admitting: Family Medicine

## 2016-03-22 ENCOUNTER — Ambulatory Visit (INDEPENDENT_AMBULATORY_CARE_PROVIDER_SITE_OTHER): Payer: BLUE CROSS/BLUE SHIELD | Admitting: Gastroenterology

## 2016-03-22 ENCOUNTER — Encounter: Payer: Self-pay | Admitting: Gastroenterology

## 2016-03-22 VITALS — BP 104/64 | HR 80 | Ht 64.0 in | Wt 150.1 lb

## 2016-03-22 DIAGNOSIS — Z7901 Long term (current) use of anticoagulants: Secondary | ICD-10-CM | POA: Diagnosis not present

## 2016-03-22 DIAGNOSIS — I25810 Atherosclerosis of coronary artery bypass graft(s) without angina pectoris: Secondary | ICD-10-CM | POA: Diagnosis not present

## 2016-03-22 DIAGNOSIS — I480 Paroxysmal atrial fibrillation: Secondary | ICD-10-CM

## 2016-03-22 DIAGNOSIS — Z86010 Personal history of colonic polyps: Secondary | ICD-10-CM

## 2016-03-22 NOTE — Progress Notes (Signed)
[facility] Consult Note:  History: [name] 03/22/2016  Referring physician: Betty Martinique, MD  Reason for consult/chief complaint: recall colon (Pt states no complaints)   Subjective  HPI:  Patient had two small TA polyps in 11/12 Feels well - no abd pain, change bowels or rectal bleeding.  ROS:  Review of Systems Denies CP or dyspnea  Past Medical History: Past Medical History:  Diagnosis Date  . AICD (automatic cardioverter/defibrillator) present 01/2013   Biventricular cardiac pacemaker in situ   . Allergic rhinitis   . CAD (coronary artery disease)    a. 2004: s/p MI in [place]. No PCI->Medical RX;  b. 07/2012 Cath: LM 30-40, LAD 70p, 70/78m D1 80-90p, OM1 small 90p, OM2 large 80-90p, 520m70-80d, RCA 20-30 diff, EF 40%, glob HK.s/p CABG  . [facility]   Patient reports left breast cancer diagnosis in 2002 treated with bilateral mastectomy positive lymph nodes with left axillary dissection followed by chemotherapy of unknown type  . Cataract   . Diabetes mellitus type II   . Exertional shortness of breath   . Hyperlipidemia   . Hypertension   . Ischemic cardiomyopathy    a. 07/2012 Echo: EF 35%, Sev inferoseptal HK, mildly dil LA, Peak PASP 5990m.  . LMarland KitchenBB (left bundle branch block)    a. intermittent - present during rapid afib 07/2012.  . NMarland Kitchenuromuscular disorder ([facility]  Patient reports chronic numbness in the right foot related to previous surgery on the right leg and "nerve damage"  . PAF (paroxysmal atrial fibrillation) [place]  a. 07/2012: Amio and xarelto initiated.  . SBO (small bowel obstruction)    Dual chamber pacer, CABG, MAZE.  Reviewed last cards note Oct 2016 LVEF 55% 04/2014  Past Surgical History: Past Surgical History:  Procedure Laterality Date  . ABDOMINAL HYSTERECTOMY  2000  . APPENDECTOMY  1974  . BI-VENTRICULAR PACEMAKER INSERTION N/A 01/25/2013   Procedure: BI-VENTRICULAR PACEMAKER INSERTION (CRT-P);   Surgeon: GreEvans LanceD;  [facility];  Service: Cardiovascular;  Laterality: N/A;  . BREAST BIOPSY Left 2002  . CARDIAC CATHETERIZATION    . CHOLECYSTECTOMY OPEN  1974  . CORONARY ARTERY BYPASS GRAFT N/A 09/28/2012   Procedure: CORONARY ARTERY BYPASS GRAFTING (CABG);  Surgeon: EdwGrace IsaacD;  [facility] [place];  Laterality: N/A;  CABG x four, using left internal mammary artery and left leg greater saphenous vein harvested endoscopically  . DILATION AND CURETTAGE OF UTERUS  1983  . EPICARDIAL PACING LEAD PLACEMENT N/A 09/28/2012   Procedure: EPICARDIAL PACING LEAD PLACEMENT;  Surgeon: EdwGrace IsaacD;  [facility] [place];  Laterality: N/A;  LV LEAD PLACEMENT  . HERNIA REPAIR    . INCISIONAL HERNIA REPAIR N/A 05/25/2015   Procedure: LAPAROSCOPIC INCISIONAL HERNIA WITH MESH ;  Surgeon: MatRolm BookbinderD;  [facility];  Laterality: N/A;  . INCONTINENCE SURGERY  2000  . INSERTION OF MESH N/A 05/25/2015   Procedure: INSERTION OF MESH;  Surgeon: MatRolm BookbinderD;  [facility] [place];  Laterality: N/A;  . INTRAOPERATIVE TRANSESOPHAGEAL ECHOCARDIOGRAM N/A 09/28/2012   Procedure: INTRAOPERATIVE TRANSESOPHAGEAL ECHOCARDIOGRAM;  Surgeon: EdwGrace IsaacD;  [facility] [place] [place] [facility];  Laterality: N/A;  . LAPAROSCOPIC INCISIONAL / UMBILICAL / VENTRAL HERNIA REPAIR  05/25/2015   IHR  . LEFT HEART CATHETERIZATION WITH CORONARY ANGIOGRAM N/A 07/20/2012   Procedure:  LEFT HEART CATHETERIZATION WITH CORONARY ANGIOGRAM;  Surgeon: [name], MD;  [facility] LAB;  Service: Cardiovascular;  Laterality: N/A;  . MASTECTOMY Right 2002  . MASTECTOMY MODIFIED RADICAL W/ AXILLARY LYMPH NODES W/ OR W/O PECTORALIS MINOR Left 2002  . MAZE N/A 09/28/2012   Procedure: MAZE;  Surgeon: [name], MD;  Location: [facility];  Service: Open Heart Surgery;  Laterality: N/A;  .  TENDON REPAIR Right 2001 X 3-4   torn ligaments and tendons in ankle up to knee from work related accident  . TUBAL LIGATION  1987     Family History: Family History  Problem Relation Age of Onset  . Kidney failure Mother   . Heart disease Mother     Died mid 47W complications of diabetes  . Colon cancer Sister   . Arthritis Other   . Cancer Other     ovarian  . Diabetes Other   . Hyperlipidemia Other   . Liver cancer Brother   . Stomach cancer Neg Hx   . Rectal cancer Neg Hx   . Esophageal cancer Neg Hx     Social History: Social History   Social History  . Marital status: Married    Spouse name: N/A  . Number of children: 3  . 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   Social History Main Topics  . Smoking status: Former Smoker    Packs/day: 1.00    Years: 10.00    Types: Cigarettes    Quit date: 03/11/2012  . Smokeless tobacco: Never Used  . Alcohol use No  . Drug use: No  . Sexual activity: Yes    Partners: Female    Birth control/ protection: Surgical   Other Topics Concern  . None   Social History Narrative   Lives with husband and mother in [place]     Regular exercise: walking   Caffeine use: 2 cups of coffee in the morning    Allergies: Allergies  Allergen Reactions  . Statins Other (See Comments)    Myalgias with rosuvastatin, atorvastatin and simvastatin     Outpatient Meds: Current Outpatient Prescriptions  Medication Sig Dispense Refill  . aspirin 81 MG tablet Take 1 tablet (81 mg total) by mouth daily. 30 tablet   . BAYER MICROLET LANCETS lancets Use as instructed 100 each 2  . Blood Glucose Monitoring Suppl (BAYER CONTOUR NEXT LINK) w/Device KIT Use to check blood sugar 2 times per day. 1 kit 2  . carvedilol (COREG) 25 MG tablet TAKE 0.5 TABLETS (12.5 MG TOTAL) BY MOUTH 2 (TWO) TIMES DAILY.   -- NEW LOWER DOSE!!! 100 tablet 4  . Dulaglutide (TRULICITY) 1.5 GN/5.6OZ SOPN INJECT 1.5 MG INTO THE  SKIN ONCE A WEEK.   NEED FOLLOW UP FOR FUTURE REFILLS 4 pen 5  . Evolocumab (REPATHA SURECLICK) 308 MG/ML SOAJ Inject 140 mg into the skin every 14 (fourteen) days. 2 pen 5  . furosemide (LASIX) 40 MG tablet TAKE 1 TABLET (40 MG TOTAL) BY MOUTH DAILY. 90 tablet 3  . glipiZIDE (GLUCOTROL) 5 MG tablet TAKE ONE-HALF TABLET BY MOUTH TWO TIMES A DAY BEFORE A MEAL 28 tablet 0  . INVOKANA 100 MG TABS tablet TAKE ONE TABLET BY MOUTH EVERY MORNING 30 tablet 1  . losartan (COZAAR) 25 MG tablet TAKE 1 TABLET (25 MG TOTAL) BY MOUTH 2 (TWO) TIMES DAILY. 180 tablet 0  . metFORMIN (GLUCOPHAGE) 1000 MG tablet TAKE  1 TABLET (1,000 MG TOTAL) BY MOUTH 2 (TWO) TIMES DAILY WITH A MEAL. 60 tablet 0  . nitroGLYCERIN (NITROSTAT) 0.4 MG SL tablet Place 1 tablet (0.4 mg total) under the tongue every 5 (five) minutes as needed for chest pain. 25 tablet 3  . XARELTO 20 MG TABS tablet TAKE 1 TABLET (20 MG TOTAL) BY MOUTH DAILY WITH SUPPER. 30 tablet 5   No current facility-administered medications for this visit.       ___________________________________________________________________ Objective   Exam:  BP 104/64   Pulse 80   Ht 5' 4" (1.626 m)   Wt 150 lb 2 oz (68.1 kg)   BMI 25.77 kg/m    General: this is a(n) well-appearing woman   Eyes: sclera anicteric, no redness  ENT: oral mucosa moist without lesions, no cervical or supraclavicular lymphadenopathy, good dentition  CV: RRR without murmur, S1/S2, no JVD, no peripheral edema  Resp: clear to auscultation bilaterally, normal RR and effort noted  GI: soft, no tenderness, with active bowel sounds. No guarding or palpable organomegaly noted.  Skin; warm and dry, no rash or jaundice noted  Neuro: awake, alert and oriented x 3. Normal gross motor function and fluent speech   Assessment: Encounter Diagnoses  Name Primary?  Marland Kitchen Hx of colonic polyps Yes  . Coronary artery disease involving coronary bypass graft of native heart without angina pectoris    . PAF (paroxysmal atrial fibrillation) ([place])   . Current use of long term anticoagulation     Patient appears appropriate for surveillance colonoscopy in [place].  However, she would like to wait until later this year after she had me her insurance deductible.  Plan:  6 month reminder if we have not heard from her. Would need to be off [place] 2 days prior, which she says she was able to do last year for cataract and hernia surgeries.  Thank you for the courtesy of this consult.  Please call me with any questions or concerns.  Nelida Meuse III  CC: Betty Martinique, MD

## 2016-03-22 NOTE — Patient Instructions (Signed)
If you are age 65 or older, your body mass index should be between 23-30. Your Body mass index is 25.77 kg/m. If this is out of the aforementioned range listed, please consider follow up with your Primary Care Provider.  If you are age 64 or younger, your body mass index should be between 19-25. Your Body mass index is 25.77 kg/m. If this is out of the aformentioned range listed, please consider follow up with your Primary Care Provider.   It has been recommended to you by your physician that you have a(n) Colonoscopy completed. Per your request, we did not schedule the procedure(s) today. Please contact our office at [phone] in 6 months should you decide to have the procedure completed. We will place a recall in our system to call you as a reminder.  Thank you for choosing [facility]  Dr Wilfrid Lund III

## 2016-03-25 ENCOUNTER — Telehealth: Payer: Self-pay | Admitting: Cardiovascular Disease

## 2016-03-25 NOTE — Telephone Encounter (Signed)
Received paperwork form insurance and [facility]. Need patient to complete Lipid panel ASAP.  Patient stated she will have it done this week.

## 2016-03-25 NOTE — Telephone Encounter (Signed)
New Message      *STAT* If patient is at the pharmacy, call can be transferred to refill team.   1. Which medications need to be refilled? (please list name of each medication and dose if known) Evolocumab (REPATHA SURECLICK) [id number] MG/ML SOAJ  2. Which pharmacy/location (including street and city if local pharmacy) [facility]  3. Do they need a 30 day or 90 day supply? Not sure     Needs pre authorization for this medication.

## 2016-04-04 DIAGNOSIS — E785 Hyperlipidemia, unspecified: Secondary | ICD-10-CM | POA: Diagnosis not present

## 2016-04-04 LAB — LIPID PANEL
CHOLESTEROL: 188 mg/dL (ref <200)
HDL: 36 mg/dL — AB (ref >50)
TRIGLYCERIDES: 708 mg/dL — AB (ref <150)
Total CHOL/HDL Ratio: 5.2 Ratio — ABNORMAL HIGH (ref <5.0)

## 2016-04-04 LAB — HEPATIC FUNCTION PANEL
ALBUMIN: 4 g/dL (ref 3.6–5.1)
ALK PHOS: 69 U/L (ref 33–130)
ALT: 17 U/L (ref 6–29)
AST: 15 U/L (ref 10–35)
Bilirubin, Direct: 0.1 mg/dL (ref <=0.2)
Indirect Bilirubin: 0.3 mg/dL (ref 0.2–1.2)
TOTAL PROTEIN: 7.2 g/dL (ref 6.1–8.1)
Total Bilirubin: 0.4 mg/dL (ref 0.2–1.2)

## 2016-04-04 LAB — LDL CHOLESTEROL, DIRECT: LDL DIRECT: 50 mg/dL (ref <130)

## 2016-04-06 ENCOUNTER — Other Ambulatory Visit: Payer: Self-pay | Admitting: Internal Medicine

## 2016-04-12 ENCOUNTER — Telehealth: Payer: Self-pay | Admitting: Pharmacist

## 2016-04-12 NOTE — Telephone Encounter (Signed)
Patient got f/u lab work at end of January. Needs Repatha until authorization renewal completed.  Samples of this drug were given to the patient  Quantity #2 Lot Number [id number] Exp 03/2018

## 2016-04-18 ENCOUNTER — Telehealth: Payer: Self-pay

## 2016-04-18 ENCOUNTER — Telehealth: Payer: Self-pay | Admitting: Cardiovascular Disease

## 2016-04-18 DIAGNOSIS — Z79899 Other long term (current) drug therapy: Secondary | ICD-10-CM

## 2016-04-18 DIAGNOSIS — E785 Hyperlipidemia, unspecified: Secondary | ICD-10-CM

## 2016-04-18 MED ORDER — FENOFIBRATE 48 MG PO TABS: 48.0000 mg | ORAL_TABLET | Freq: Every day | ORAL | 11 refills | Status: DC

## 2016-04-18 NOTE — Telephone Encounter (Signed)
-----   Message from [name], MD sent at 04/04/2016  5:11 PM EST ----- Cholesterol OK, but Triglycerides much higher than before. Was this truly a fasting specimen? Has glucose control been poor? Copy sent to PCP and Endocrinologist

## 2016-04-18 NOTE — Telephone Encounter (Signed)
Pt is on her way to speak to you,said she had to pick up some medicine. She said you had called her.

## 2016-04-18 NOTE — Telephone Encounter (Signed)
Returned call to patient. Reviewed recommendations and agreed with plan. Prescription for Fenofibrate 48 mg QD sent to patient's preferred pharmacy electronically.  Repeat labs ordered and paperwork mailed to patient. Patient also inquired about how to lower triglycerides. Advised that I would include some information regarding triglycerides (what food to avoid) along with her lab slips. Patient voiced understanding and agreed with plan.

## 2016-04-18 NOTE — Telephone Encounter (Signed)
Notes Recorded by [name], MD on 04/12/2016 at 5:49 PM EST Not sure why TG have worsened then. She has had elevated TG before, but related to poor DM control.  Please add fenofibrate 48 mg or equivalent dose of fenofibric acid (her medical insurance may have a preferred agent of either in this range). Recheck in 3 months (lipid and cmet). ------  Notes Recorded by Dionne Bucy Truitt, CMA on 04/12/2016 at 2:35 PM EST Called patient with results. Patient verbalized understanding. Patient was in fact fasting for these labs. Her glucose is well-controlled. States her last check early January was 6.4.

## 2016-04-22 NOTE — Telephone Encounter (Signed)
See other telephone encounter from 04/18/16.

## 2016-05-04 ENCOUNTER — Other Ambulatory Visit: Payer: Self-pay | Admitting: Cardiovascular Disease

## 2016-05-07 ENCOUNTER — Other Ambulatory Visit: Payer: Self-pay | Admitting: Family Medicine

## 2016-05-08 ENCOUNTER — Encounter: Payer: Self-pay | Admitting: Family Medicine

## 2016-05-10 ENCOUNTER — Other Ambulatory Visit: Payer: Self-pay | Admitting: Family Medicine

## 2016-05-12 ENCOUNTER — Other Ambulatory Visit: Payer: Self-pay | Admitting: Internal Medicine

## 2016-05-13 ENCOUNTER — Ambulatory Visit (INDEPENDENT_AMBULATORY_CARE_PROVIDER_SITE_OTHER): Payer: BLUE CROSS/BLUE SHIELD | Admitting: *Deleted

## 2016-05-13 DIAGNOSIS — I255 Ischemic cardiomyopathy: Secondary | ICD-10-CM | POA: Diagnosis not present

## 2016-05-13 NOTE — Progress Notes (Signed)
Remote pacemaker transmission.   

## 2016-05-14 ENCOUNTER — Other Ambulatory Visit: Payer: Self-pay

## 2016-05-14 ENCOUNTER — Encounter: Payer: Self-pay | Admitting: Cardiology

## 2016-05-14 LAB — CUP PACEART REMOTE DEVICE CHECK
Brady Statistic AP VP Percent: 1 %
Brady Statistic AP VS Percent: 1 %
Brady Statistic AS VP Percent: 99 %
Implantable Lead Implant Date: 20140721
Implantable Lead Implant Date: 20141117
Implantable Lead Location: 753858
Implantable Lead Location: 753860
Lead Channel Impedance Value: 400 Ohm
Lead Channel Impedance Value: 480 Ohm
Lead Channel Pacing Threshold Amplitude: 1 V
Lead Channel Pacing Threshold Amplitude: 1.25 V
Lead Channel Pacing Threshold Pulse Width: 0.4 ms
Lead Channel Pacing Threshold Pulse Width: 0.5 ms
Lead Channel Sensing Intrinsic Amplitude: 12 mV
Lead Channel Setting Pacing Amplitude: 2 V
Lead Channel Setting Pacing Amplitude: 2.5 V
Lead Channel Setting Sensing Sensitivity: 2 mV
MDC IDC LEAD IMPLANT DT: 20141117
MDC IDC LEAD LOCATION: 753859
MDC IDC MSMT BATTERY REMAINING LONGEVITY: 79 mo
MDC IDC MSMT BATTERY REMAINING PERCENTAGE: 95.5 %
MDC IDC MSMT BATTERY VOLTAGE: 2.96 V
MDC IDC MSMT LEADCHNL RA IMPEDANCE VALUE: 400 Ohm
MDC IDC MSMT LEADCHNL RA PACING THRESHOLD AMPLITUDE: 0.75 V
MDC IDC MSMT LEADCHNL RA PACING THRESHOLD PULSEWIDTH: 0.4 ms
MDC IDC MSMT LEADCHNL RA SENSING INTR AMPL: 0.9 mV
MDC IDC PG IMPLANT DT: 20141117
MDC IDC PG SERIAL: 2981305
MDC IDC SESS DTM: 20180305070014
MDC IDC SET LEADCHNL LV PACING AMPLITUDE: 2.25 V
MDC IDC SET LEADCHNL LV PACING PULSEWIDTH: 0.5 ms
MDC IDC SET LEADCHNL RV PACING PULSEWIDTH: 0.4 ms
MDC IDC STAT BRADY AS VS PERCENT: 1 %
MDC IDC STAT BRADY RA PERCENT PACED: 1 %

## 2016-05-14 MED ORDER — METFORMIN HCL 1000 MG PO TABS: ORAL_TABLET | ORAL | 0 refills | Status: DC

## 2016-05-15 ENCOUNTER — Telehealth: Payer: Self-pay | Admitting: Pharmacist

## 2016-05-15 NOTE — Telephone Encounter (Signed)
Patient called to f/u on her [place].  Her insurance denied the renewal due to significant increased on Tg.  Cardiologist, DR Croitoru, stated patient on fenofibrate therapy on 04/18/16.  Patein to repeat lipid panel in 3 months.  Once results available we can re-try insurance approval for Repatha.   Patient expressed understanding. She will follow up with [place] in 2 months.

## 2016-05-23 ENCOUNTER — Ambulatory Visit: Payer: BLUE CROSS/BLUE SHIELD | Admitting: Internal Medicine

## 2016-05-24 ENCOUNTER — Telehealth: Payer: Self-pay | Admitting: Internal Medicine

## 2016-05-24 NOTE — Telephone Encounter (Signed)
Patient no showed today's appt. Please advise on how to follow up. °A. No follow up necessary. °B. Follow up urgent. Contact patient immediately. °C. Follow up necessary. Contact patient and schedule visit in ___ days. °D. Follow up advised. Contact patient and schedule visit in ____weeks. ° °

## 2016-05-24 NOTE — Telephone Encounter (Signed)
3mo

## 2016-06-06 ENCOUNTER — Other Ambulatory Visit: Payer: Self-pay | Admitting: Internal Medicine

## 2016-06-12 ENCOUNTER — Other Ambulatory Visit: Payer: Self-pay | Admitting: Internal Medicine

## 2016-06-12 LAB — HM DIABETES EYE EXAM

## 2016-07-02 DIAGNOSIS — D3132 Benign neoplasm of left choroid: Secondary | ICD-10-CM | POA: Diagnosis not present

## 2016-07-02 DIAGNOSIS — H26493 Other secondary cataract, bilateral: Secondary | ICD-10-CM | POA: Diagnosis not present

## 2016-07-02 DIAGNOSIS — E113393 Type 2 diabetes mellitus with moderate nonproliferative diabetic retinopathy without macular edema, bilateral: Secondary | ICD-10-CM | POA: Diagnosis not present

## 2016-07-02 LAB — HM DIABETES EYE EXAM

## 2016-07-03 NOTE — Telephone Encounter (Signed)
error 

## 2016-07-07 ENCOUNTER — Other Ambulatory Visit: Payer: Self-pay | Admitting: Internal Medicine

## 2016-07-09 ENCOUNTER — Other Ambulatory Visit: Payer: Self-pay | Admitting: General Surgery

## 2016-07-09 DIAGNOSIS — K432 Incisional hernia without obstruction or gangrene: Secondary | ICD-10-CM | POA: Diagnosis not present

## 2016-07-10 ENCOUNTER — Other Ambulatory Visit: Payer: Self-pay | Admitting: General Surgery

## 2016-07-10 DIAGNOSIS — K432 Incisional hernia without obstruction or gangrene: Secondary | ICD-10-CM

## 2016-07-18 ENCOUNTER — Ambulatory Visit
Admission: RE | Admit: 2016-07-18 | Discharge: 2016-07-18 | Disposition: A | Discharge status: Home / Self Care | Payer: BLUE CROSS/BLUE SHIELD | Source: Ambulatory Visit | Attending: General Surgery | Admitting: General Surgery

## 2016-07-18 DIAGNOSIS — K432 Incisional hernia without obstruction or gangrene: Secondary | ICD-10-CM | POA: Diagnosis not present

## 2016-07-18 MED ORDER — IOPAMIDOL (ISOVUE-300) INJECTION 61%
100.0000 mL | Freq: Once | INTRAVENOUS | Status: AC | PRN
  Administered 2016-07-18: 100 mL via INTRAVENOUS

## 2016-07-28 ENCOUNTER — Other Ambulatory Visit: Payer: Self-pay | Admitting: Cardiovascular Disease

## 2016-08-02 ENCOUNTER — Encounter: Payer: Self-pay | Admitting: Gastroenterology

## 2016-08-02 ENCOUNTER — Ambulatory Visit: Payer: BLUE CROSS/BLUE SHIELD | Admitting: Internal Medicine

## 2016-08-04 ENCOUNTER — Other Ambulatory Visit: Payer: Self-pay | Admitting: Internal Medicine

## 2016-08-07 ENCOUNTER — Other Ambulatory Visit: Payer: Self-pay | Admitting: Internal Medicine

## 2016-08-07 NOTE — Telephone Encounter (Signed)
Okay to refill for now. 

## 2016-08-07 NOTE — Telephone Encounter (Signed)
Submitted

## 2016-08-07 NOTE — Telephone Encounter (Signed)
Please advise, patient did not keep previous appointment with you in May.

## 2016-08-08 ENCOUNTER — Other Ambulatory Visit: Payer: Self-pay | Admitting: Family Medicine

## 2016-08-12 ENCOUNTER — Ambulatory Visit (INDEPENDENT_AMBULATORY_CARE_PROVIDER_SITE_OTHER): Payer: BLUE CROSS/BLUE SHIELD | Admitting: *Deleted

## 2016-08-12 DIAGNOSIS — I255 Ischemic cardiomyopathy: Secondary | ICD-10-CM | POA: Diagnosis not present

## 2016-08-12 NOTE — Progress Notes (Signed)
Remote pacemaker transmission.   

## 2016-08-14 LAB — CUP PACEART REMOTE DEVICE CHECK
Battery Remaining Longevity: 79 mo
Battery Remaining Percentage: 95.5 %
Battery Voltage: 2.96 V
Brady Statistic AP VS Percent: 1 %
Brady Statistic AS VP Percent: 99 %
Implantable Lead Implant Date: 20141117
Implantable Lead Location: 753860
Implantable Lead Model: 5071
Implantable Pulse Generator Implant Date: 20141117
Lead Channel Impedance Value: 400 Ohm
Lead Channel Impedance Value: 400 Ohm
Lead Channel Impedance Value: 450 Ohm
Lead Channel Pacing Threshold Amplitude: 0.75 V
Lead Channel Pacing Threshold Pulse Width: 0.4 ms
Lead Channel Sensing Intrinsic Amplitude: 1.5 mV
Lead Channel Setting Pacing Amplitude: 2 V
Lead Channel Setting Pacing Amplitude: 2.5 V
Lead Channel Setting Pacing Pulse Width: 0.4 ms
Lead Channel Setting Pacing Pulse Width: 0.5 ms
MDC IDC LEAD IMPLANT DT: 20140721
MDC IDC LEAD IMPLANT DT: 20141117
MDC IDC LEAD LOCATION: 753858
MDC IDC LEAD LOCATION: 753859
MDC IDC MSMT LEADCHNL LV PACING THRESHOLD AMPLITUDE: 1.25 V
MDC IDC MSMT LEADCHNL LV PACING THRESHOLD PULSEWIDTH: 0.5 ms
MDC IDC MSMT LEADCHNL RA PACING THRESHOLD PULSEWIDTH: 0.4 ms
MDC IDC MSMT LEADCHNL RV PACING THRESHOLD AMPLITUDE: 1 V
MDC IDC MSMT LEADCHNL RV SENSING INTR AMPL: 12 mV
MDC IDC PG SERIAL: 2981305
MDC IDC SESS DTM: 20180604060014
MDC IDC SET LEADCHNL LV PACING AMPLITUDE: 2.25 V
MDC IDC SET LEADCHNL RV SENSING SENSITIVITY: 2 mV
MDC IDC STAT BRADY AP VP PERCENT: 1 %
MDC IDC STAT BRADY AS VS PERCENT: 1 %
MDC IDC STAT BRADY RA PERCENT PACED: 1 %
Pulse Gen Model: 3222

## 2016-08-20 ENCOUNTER — Encounter: Payer: Self-pay | Admitting: Cardiology

## 2016-08-21 IMAGING — CR DG CHEST 2V
2 series · 2 of 2 positions shown · non-contrast
Comparison: 10/23/2013

CLINICAL DATA: Initial evaluation for shortness of breath for
several months, quit smoking 2 years ago

EXAM:
CHEST  2 VIEW

[view not recorded (1 of 2)]
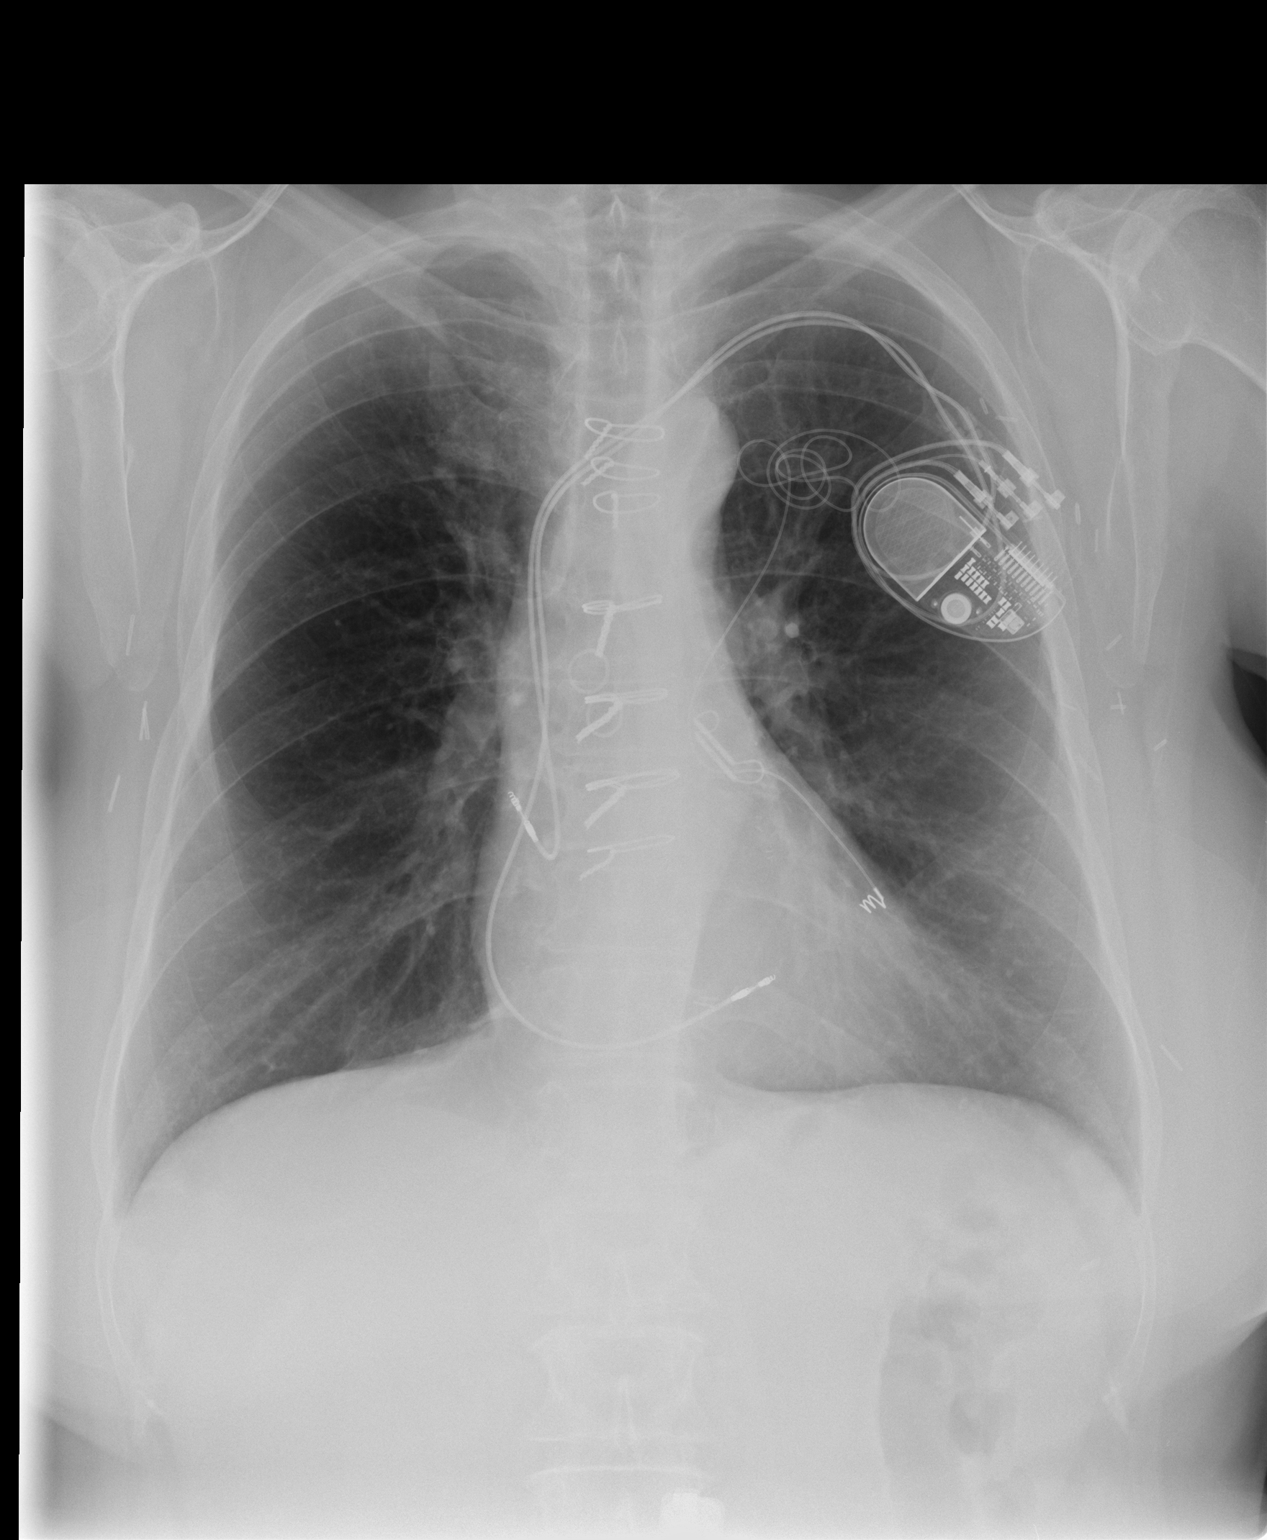

[view not recorded (2 of 2)]
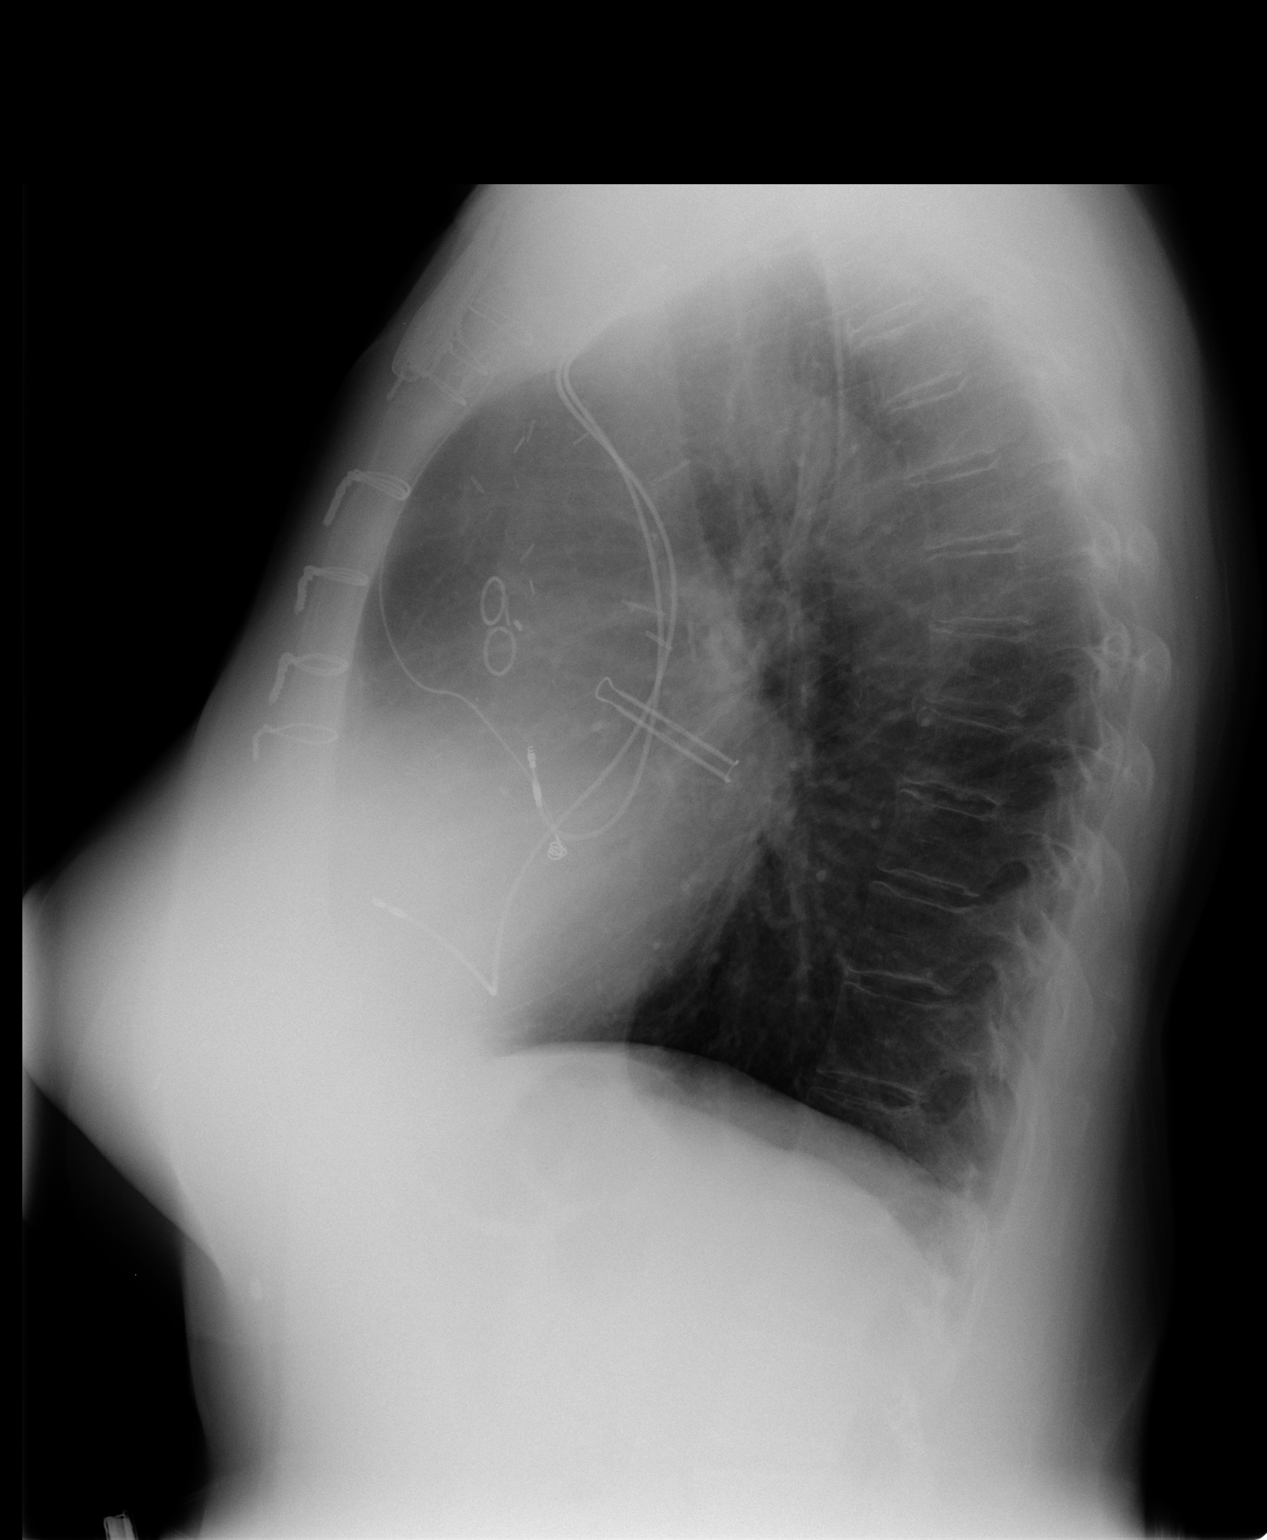

[2 of 2 positions shown; findings below may reference images not displayed]

FINDINGS: The heart size and mediastinal contours are normal. Vascular pattern
is normal. Lungs are clear. Cardiac pacer identified with leads in
unchanged position in generator over left thorax as previously
noted.
IMPRESSION: No active cardiopulmonary disease.

## 2016-08-23 ENCOUNTER — Telehealth: Payer: Self-pay | Admitting: Pharmacist Clinician (PhC)/ Clinical Pharmacy Specialist

## 2016-08-23 NOTE — Telephone Encounter (Signed)
LMOM, patient needs labs drawn to see if can get back on Repatha

## 2016-09-03 ENCOUNTER — Other Ambulatory Visit: Payer: Self-pay | Admitting: Internal Medicine

## 2016-09-08 ENCOUNTER — Other Ambulatory Visit: Payer: Self-pay | Admitting: Internal Medicine

## 2016-10-02 ENCOUNTER — Other Ambulatory Visit: Payer: Self-pay | Admitting: Cardiovascular Disease

## 2016-10-02 ENCOUNTER — Other Ambulatory Visit: Payer: Self-pay | Admitting: Internal Medicine

## 2016-10-04 ENCOUNTER — Ambulatory Visit (INDEPENDENT_AMBULATORY_CARE_PROVIDER_SITE_OTHER): Payer: BLUE CROSS/BLUE SHIELD | Admitting: Internal Medicine

## 2016-10-04 ENCOUNTER — Encounter: Payer: Self-pay | Admitting: Internal Medicine

## 2016-10-04 VITALS — BP 130/74 | HR 94 | Ht 65.0 in | Wt 155.0 lb

## 2016-10-04 DIAGNOSIS — E559 Vitamin D deficiency, unspecified: Secondary | ICD-10-CM

## 2016-10-04 DIAGNOSIS — E1159 Type 2 diabetes mellitus with other circulatory complications: Secondary | ICD-10-CM | POA: Diagnosis not present

## 2016-10-04 LAB — POCT GLYCOSYLATED HEMOGLOBIN (HGB A1C): HEMOGLOBIN A1C: 6.9

## 2016-10-04 LAB — VITAMIN D 25 HYDROXY (VIT D DEFICIENCY, FRACTURES): VITD: 26.08 ng/mL — AB (ref 30.00–100.00)

## 2016-10-04 MED ORDER — DULAGLUTIDE 1.5 MG/0.5ML ~~LOC~~ SOAJ: SUBCUTANEOUS | 5 refills | Status: DC

## 2016-10-04 MED ORDER — CANAGLIFLOZIN 100 MG PO TABS: 100.0000 mg | ORAL_TABLET | Freq: Every morning | ORAL | 5 refills | Status: DC

## 2016-10-04 MED ORDER — GLIPIZIDE 5 MG PO TABS: ORAL_TABLET | ORAL | 5 refills | Status: DC

## 2016-10-04 MED ORDER — METFORMIN HCL 1000 MG PO TABS: ORAL_TABLET | ORAL | 5 refills | Status: DC

## 2016-10-04 NOTE — Progress Notes (Signed)
Patient ID: [name], female   DOB: October 17, 1950, 66 y.o.   MRN: [id number]  HPI: [name] is a 66 y.o.-year-old female, returning for f/u for DM2, dx 2002, insulin-independent, uncontrolled, with complications (CAD, s/p CABG). Last visit 7 mo ago.  Her son is going through a divorce and he was also sick.  She will switch to Medicare in 02/2016, after which she is not sure whether r medicines will be covered or not. She feels that if we continue with the current regimen will get into the doughnut very soon.  Last hemoglobin A1c was: Lab Results  Component Value Date   HGBA1C 6.5 02/22/2016   HGBA1C 6.5 (H) 08/11/2015   HGBA1C 7.7 (H) 04/10/2015  Patient has been on insulin before >> hypoglycemia.   Pt is on a regimen of: - Metformin 2000 mg with dinner - Glipizide 10 mg bid >> 10 mg in am and 5 mg in pm >> 5 mg bid >> 2.5 mg bid - Invokana 100 mg (started 10/2013) - no yeast inf or UTIs. - Bydureon 2 mg weekly - started 24/8250 >> Trulicity 1.5 mg weekly Could not afford Januvia 100 mg daily >> 100$ reduced from 348%. She was on Amaryl 2 mg bid.  Pt checks her sugars 1x a day: - am: 135-140 >> 120-150 >> 139-187 >> 119-151, 185 >> 90s, 178 - before lunch: 130-220 >> 80-120 >> 80-120 >> 108-110 >> 93, 122 >> 70-80s (took Glipizide and did not eat) - after lunch: 130-140 >> 92-157 >> 101-106 >> n/c - before dinner: 50, 70-120, 140 >> 88-190 >> 108-117 >> 100-115 - bedtime:  100-120, 170 if has fruit >> n/c >> 101-139 >> n/c Lowest sugar was 93 >> 70; she has hypoglycemia awareness at 60.  Highest sugar was 185 >> 178.  - No CKD, last BUN/creatinine:  Lab Results  Component Value Date   BUN 18 08/11/2015   CREATININE 0.72 08/11/2015  on losartan. - last set of lipids: Lab Results  Component Value Date   CHOL 188 04/04/2016   HDL 36 (L) 04/04/2016   LDLCALC NOT CALC 04/04/2016   LDLDIRECT 50 04/04/2016   TRIG 708 (H) 04/04/2016   CHOLHDL 5.2 (H) 04/04/2016  She is  off Repatha b/c cost - started 10/2015. She has an allergy to statins - last eye exam was 06/2016. Has cataracts - had surgery in 10/2015. No DR. - No numbness and tingling in her feet. She is on aspirin 81 mg daily  Her vitamin D level was also low, so we started 5000 units vitamin D daily, which continues now. Previous levels reviewed: Lab Results  Component Value Date   VD25OH 38.79 02/22/2016   VD25OH 12.35 (L) 10/10/2015   She has a h/o BrCA  - s/p B mastectomy.  ROS: Constitutional: + weight gain, + fatigue, no subjective hyperthermia, no subjective hypothermia Eyes: no blurry vision, no xerophthalmia ENT: no sore throat, no nodules palpated in throat, no dysphagia, no odynophagia, no hoarseness Cardiovascular: no CP/no SOB/no palpitations/no leg swelling Respiratory: no cough/no SOB/no wheezing Gastrointestinal: no N/no V/no D/no C/no acid reflux Musculoskeletal: no muscle aches/no joint aches Skin: no rashes, no hair loss Neurological: no tremors/no numbness/no tingling/no dizziness, + HA  I reviewed pt's medications, allergies, PMH, social hx, family hx, and changes were documented in the history of present illness. Otherwise, unchanged from my initial visit note.  PE: BP 130/74 (BP Location: Left Arm, Patient Position: Sitting)   Pulse 94   Ht  _0  (1.651 m)   Wt 155 lb (70.3 kg)   SpO2 97%   BMI 25.79 kg/m  Body mass index is 25.79 kg/m.  Wt Readings from Last 3 Encounters:  10/04/16 155 lb (70.3 kg)  03/22/16 150 lb 2 oz (68.1 kg)  02/22/16 150 lb 9.6 oz (68.3 kg)   Constitutional: slightly overweight, in NAD Eyes: PERRLA, EOMI, no exophthalmos ENT: moist mucous membranes, no thyromegaly, no cervical lymphadenopathy Cardiovascular: Tachycardia,RR, No MRG Respiratory: CTA B Gastrointestinal: abdomen soft, NT, ND, BS+ Musculoskeletal: no deformities, strength intact in all 4 Skin: moist, warm, no rashes Neurological: no tremor with outstretched hands, DTR  normal in all 4  ASSESSMENT: 1. 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 Vitamin D deficiency  PLAN:  1. Patient with long-standing, recently more controlled diabetes, on oral antidiabetic regimen and GLP-1 receptor agonist.At this visit, her HbA1c is worse (6.9%, higher), although this is not quite obvious from her sugar log. - she tells me she was compliant with her medication regimen, but not eating well. She also takes glipizide in a.m. Without eating breakfast and subsequently her sugars before lunch are low. I advised her to skip glipizide if she skips breakfast, but to try not to skip breakfast ideally. - I advised her to: Patient Instructions  Please continue: - Metformin 2000 mg with dinner - Glipizide 2.5 mg in am and 2.5 before dinner - Invokana 784 mg in am  - Trulicity 1.5 mg daily weekly  Please stop at the lab.  Please come back for a follow-up appointment in 3 months  - continue checking sugars at different times of the day - check 1-2x a day, rotating checks - advised for yearly eye exams >> she is UTD - Will check a CMP today - Return to clinic in 3 mo with sugar log   2. Vitamin D deficiency - previous vitamin D level was very low, but this normalized on 5000 units vitamin D daily: Lab Results  Component Value Date   VD25OH 38.79 02/22/2016   VD25OH 12.35 (L) 10/10/2015  - we'll continue the current dose - she takes this daily - will recheck her level today  Component     Latest Ref Rng & Units 10/04/2016          Sodium     135 - 146 mmol/L 137  Potassium     3.5 - 5.3 mmol/L 3.5  Chloride     98 - 110 mmol/L 99  CO2     20 - 31 mmol/L 20  Glucose     65 - 99 mg/dL 189 (H)  BUN     7 - 25 mg/dL 20  Creatinine     0.50 - 0.99 mg/dL 1.10 (H)  Calcium     8.6 - 10.4 mg/dL 9.1  Total Protein     6.1 - 8.1 g/dL  6.7  Albumin     3.6 - 5.1 g/dL 4.1  AST     10 - 35 U/L 13  ALT     6 - 29 U/L 16  Alkaline Phosphatase     33 - 130 U/L 58  Total Bilirubin     0.2 - 1.2 mg/dL 0.4  GFR, Est Non African American     >=60 mL/min 53 (L)  GFR, Est African American     >=  60 mL/min 61  Hemoglobin A1C      6.9  VITD     30.00 - 100.00 ng/mL 26.08 (L)   GFR is slightly lower. Will advise her to stay well hydrated and repeat her labs at next visit.  Vitamin D is also slightly low I suspect some missed doses. Will advise her to take the 5000 units every day and will recheck at next visit.   Philemon Kingdom, MD PhD [facility]

## 2016-10-04 NOTE — Patient Instructions (Addendum)
Please continue: - Metformin 2000 mg with dinner - Glipizide 2.5 mg in am and 2.5 before dinner - Invokana 848 mg in am  - Trulicity 1.5 mg daily weekly  Please stop at the lab.  Please come back for a follow-up appointment in 3 months

## 2016-10-05 LAB — COMPLETE METABOLIC PANEL WITH GFR
ALBUMIN: 4.1 g/dL (ref 3.6–5.1)
ALK PHOS: 58 U/L (ref 33–130)
ALT: 16 U/L (ref 6–29)
AST: 13 U/L (ref 10–35)
BILIRUBIN TOTAL: 0.4 mg/dL (ref 0.2–1.2)
BUN: 20 mg/dL (ref 7–25)
CALCIUM: 9.1 mg/dL (ref 8.6–10.4)
CO2: 20 mmol/L (ref 20–31)
Chloride: 99 mmol/L (ref 98–110)
Creat: 1.1 mg/dL — ABNORMAL HIGH (ref 0.50–0.99)
GFR, Est African American: 61 mL/min (ref >=60)
GFR, Est Non African American: 53 mL/min — ABNORMAL LOW (ref >=60)
Glucose, Bld: 189 mg/dL — ABNORMAL HIGH (ref 65–99)
POTASSIUM: 3.5 mmol/L (ref 3.5–5.3)
Sodium: 137 mmol/L (ref 135–146)
TOTAL PROTEIN: 6.7 g/dL (ref 6.1–8.1)

## 2016-11-12 ENCOUNTER — Ambulatory Visit (INDEPENDENT_AMBULATORY_CARE_PROVIDER_SITE_OTHER): Payer: BLUE CROSS/BLUE SHIELD | Admitting: *Deleted

## 2016-11-12 DIAGNOSIS — I255 Ischemic cardiomyopathy: Secondary | ICD-10-CM

## 2016-11-13 NOTE — Progress Notes (Signed)
Remote pacemaker transmission.   

## 2016-11-20 ENCOUNTER — Encounter: Payer: Self-pay | Admitting: Cardiology

## 2016-11-20 ENCOUNTER — Other Ambulatory Visit: Payer: Self-pay | Admitting: Family Medicine

## 2016-11-22 LAB — CUP PACEART REMOTE DEVICE CHECK
Battery Remaining Longevity: 67 mo
Battery Voltage: 2.95 V
Brady Statistic RA Percent Paced: 1 % — CL
Date Time Interrogation Session: 20180914142032
Implantable Lead Implant Date: 20140721
Implantable Lead Implant Date: 20141117
Implantable Lead Implant Date: 20141117
Implantable Lead Location: 753859
Implantable Lead Model: 5071
Lead Channel Impedance Value: 400 Ohm
Lead Channel Setting Pacing Amplitude: 2.5 V
Lead Channel Setting Pacing Pulse Width: 0.5 ms
MDC IDC LEAD LOCATION: 753858
MDC IDC LEAD LOCATION: 753860
MDC IDC MSMT BATTERY REMAINING PERCENTAGE: 89 %
MDC IDC MSMT LEADCHNL RA IMPEDANCE VALUE: 400 Ohm
MDC IDC MSMT LEADCHNL RA SENSING INTR AMPL: 0.8 mV
MDC IDC MSMT LEADCHNL RV IMPEDANCE VALUE: 480 Ohm
MDC IDC PG IMPLANT DT: 20141117
MDC IDC SET LEADCHNL LV PACING AMPLITUDE: 2.25 V
MDC IDC SET LEADCHNL RA PACING AMPLITUDE: 2 V
MDC IDC SET LEADCHNL RV PACING PULSEWIDTH: 0.4 ms
MDC IDC SET LEADCHNL RV SENSING SENSITIVITY: 2 mV
MDC IDC STAT BRADY RV PERCENT PACED: 99 % — AB
Pulse Gen Model: 3222
Pulse Gen Serial Number: 2981305

## 2016-11-27 ENCOUNTER — Ambulatory Visit (INDEPENDENT_AMBULATORY_CARE_PROVIDER_SITE_OTHER): Payer: BLUE CROSS/BLUE SHIELD | Admitting: Cardiovascular Disease

## 2016-11-27 ENCOUNTER — Encounter: Payer: Self-pay | Admitting: Cardiovascular Disease

## 2016-11-27 VITALS — BP 128/76 | HR 82 | Ht 65.0 in | Wt 150.0 lb

## 2016-11-27 DIAGNOSIS — I25810 Atherosclerosis of coronary artery bypass graft(s) without angina pectoris: Secondary | ICD-10-CM

## 2016-11-27 DIAGNOSIS — Z7901 Long term (current) use of anticoagulants: Secondary | ICD-10-CM

## 2016-11-27 DIAGNOSIS — I10 Essential (primary) hypertension: Secondary | ICD-10-CM

## 2016-11-27 DIAGNOSIS — I5042 Chronic combined systolic (congestive) and diastolic (congestive) heart failure: Secondary | ICD-10-CM | POA: Diagnosis not present

## 2016-11-27 DIAGNOSIS — I255 Ischemic cardiomyopathy: Secondary | ICD-10-CM

## 2016-11-27 DIAGNOSIS — I480 Paroxysmal atrial fibrillation: Secondary | ICD-10-CM

## 2016-11-27 DIAGNOSIS — E785 Hyperlipidemia, unspecified: Secondary | ICD-10-CM | POA: Diagnosis not present

## 2016-11-27 DIAGNOSIS — E782 Mixed hyperlipidemia: Secondary | ICD-10-CM | POA: Diagnosis not present

## 2016-11-27 DIAGNOSIS — E1159 Type 2 diabetes mellitus with other circulatory complications: Secondary | ICD-10-CM | POA: Diagnosis not present

## 2016-11-27 LAB — LIPID PANEL
CHOLESTEROL TOTAL: 203 mg/dL — AB (ref 100–199)
Chol/HDL Ratio: 3.9 ratio (ref 0.0–4.4)
HDL: 52 mg/dL (ref >39)
LDL Calculated: 102 mg/dL — ABNORMAL HIGH (ref 0–99)
Triglycerides: 244 mg/dL — ABNORMAL HIGH (ref 0–149)
VLDL CHOLESTEROL CAL: 49 mg/dL — AB (ref 5–40)

## 2016-11-27 LAB — CUP PACEART INCLINIC DEVICE CHECK
Brady Statistic RA Percent Paced: 0 %
Brady Statistic RV Percent Paced: 99.88 %
Implantable Lead Implant Date: 20140721
Implantable Lead Implant Date: 20141117
Implantable Lead Location: 753858
Implantable Lead Model: 5071
Implantable Pulse Generator Implant Date: 20141117
Lead Channel Impedance Value: 387.5 Ohm
Lead Channel Impedance Value: 400 Ohm
Lead Channel Pacing Threshold Amplitude: 0.5 V
Lead Channel Pacing Threshold Amplitude: 0.5 V
Lead Channel Pacing Threshold Amplitude: 0.75 V
Lead Channel Pacing Threshold Amplitude: 1.5 V
Lead Channel Pacing Threshold Pulse Width: 0.4 ms
Lead Channel Pacing Threshold Pulse Width: 0.4 ms
Lead Channel Pacing Threshold Pulse Width: 0.4 ms
Lead Channel Pacing Threshold Pulse Width: 0.5 ms
Lead Channel Sensing Intrinsic Amplitude: 1.5 mV
Lead Channel Sensing Intrinsic Amplitude: 12 mV
Lead Channel Setting Pacing Amplitude: 2 V
Lead Channel Setting Pacing Pulse Width: 0.4 ms
Lead Channel Setting Sensing Sensitivity: 2 mV
MDC IDC LEAD IMPLANT DT: 20141117
MDC IDC LEAD LOCATION: 753859
MDC IDC LEAD LOCATION: 753860
MDC IDC MSMT BATTERY REMAINING LONGEVITY: 67 mo
MDC IDC MSMT BATTERY VOLTAGE: 2.95 V
MDC IDC MSMT LEADCHNL LV PACING THRESHOLD AMPLITUDE: 1.5 V
MDC IDC MSMT LEADCHNL LV PACING THRESHOLD PULSEWIDTH: 0.5 ms
MDC IDC MSMT LEADCHNL RV IMPEDANCE VALUE: 450 Ohm
MDC IDC MSMT LEADCHNL RV PACING THRESHOLD AMPLITUDE: 0.75 V
MDC IDC MSMT LEADCHNL RV PACING THRESHOLD PULSEWIDTH: 0.4 ms
MDC IDC PG SERIAL: 2981305
MDC IDC SESS DTM: 20180919105419
MDC IDC SET LEADCHNL LV PACING AMPLITUDE: 2.25 V
MDC IDC SET LEADCHNL LV PACING PULSEWIDTH: 0.5 ms
MDC IDC SET LEADCHNL RV PACING AMPLITUDE: 2.5 V
Pulse Gen Model: 3222

## 2016-11-27 NOTE — Progress Notes (Signed)
Cardiology Office Note:    Date:  11/27/2016   ID:  Kinsley Holderman, DOB May 31, 1950, MRN [id number]  PCP:  Martinique, Betty G, MD  Cardiologist:  [name], MD    Referring MD: Martinique, Betty G, MD   Chief Complaint  Patient presents with  . Follow-up    pt denied chest pain and SOB  Follow-up coronary artery disease, CRT-P  History of Present Illness:    [name] is a 66 y.o. female with a hx of CAD s/p CABG 2014, paroxysmal atrial fibrillation, CHF with recovery of EF after CRT-P (St. Jude), type 2 diabetes mellitus, hypercholesterolemia, hypertension returning for follow-up.  She is feeling great. She denies problems with exertional angina or exertional dyspnea. She has not had claudication or leg edema. She denies syncope or palpitations. She has not had any foot problems but has occasional achy joints.  She is on standard of care treatment for type 2 diabetes mellitus using both SGLT2 inhibitor and GLP-1 inhibitor, her most recent hemoglobin A1c was 6.9%. She is also on metformin and glipizide. Despite taking fenofibrate she still had high triglycerides earlier this year. She is currently not taking a statin.  Device interrogation shows normal function, anticipated generator longevity of 5.6-6.5 years, normal lead parameters, no evidence of interim ventricular tachycardia or atrial fibrillation. There is less than 1 % atrial pacing and >99% biventricular pacing. The device has not recorded atrial fibrillation or ventricular tachycardia. In late August she had a 3 minute episode of regular paroxysmal atrial tachycardia. She recalls this occurred during an emotional discussion.  Mrs. Perl presented with myocardial infarction in 2004. In May 2014 presented with congestive heart failure and atrial fibrillation in the setting of three-vessel coronary artery disease leading to bypass surgery (July 2014, Dr. Servando Snare, LIMA to LAD, SVG to ramus intermedius, sequential SVG to OM1 and OM 2,  surgical maze procedure). She had persistent depressed left ventricular systolic function and received a dual-chamber biventricular pacemaker (St. Jude Allure, Dr. Lovena Le in November 2014).   Last echo February 2016 shows recovery of left ventricular ejection fraction 50-55%. In January 2015, she was admitted with acute on chronic heart failure due to excessive intravenous fluid administration during an episode of small bowel obstruction.. She has type 2 diabetes mellitus that has been difficult to control as well as hypertension and hyperlipidemia.  Past Medical History:  Diagnosis Date  . AICD (automatic cardioverter/defibrillator) present 01/2013   Biventricular cardiac pacemaker in situ   . Allergic rhinitis   . CAD (coronary artery disease)    a. 2004: s/p MI in [place]. No PCI->Medical RX;  b. 07/2012 Cath: LM 30-40, LAD 70p, 70/69m D1 80-90p, OM1 small 90p, OM2 large 80-90p, 592m70-80d, RCA 20-30 diff, EF 40%, glob HK.s/p CABG  . [facility]   Patient reports left breast cancer diagnosis in 2002 treated with bilateral mastectomy positive lymph nodes with left axillary dissection followed by chemotherapy of unknown type  . Cataract   . Diabetes mellitus type II   . Exertional shortness of breath   . Hyperlipidemia   . Hypertension   . Ischemic cardiomyopathy    a. 07/2012 Echo: EF 35%, Sev inferoseptal HK, mildly dil LA, Peak PASP 5961m.  . LMarland KitchenBB (left bundle branch block)    a. intermittent - present during rapid afib 07/2012.  . [facility] ([facility]  Patient reports chronic numbness in the right foot related to previous surgery on the right leg and "  nerve damage"  . PAF (paroxysmal atrial fibrillation) ([place])    a. 07/2012: Amio and xarelto initiated.  . SBO (small bowel obstruction) ([place])     Past Surgical History:  Procedure Laterality Date  . ABDOMINAL HYSTERECTOMY  2000  . APPENDECTOMY  1974  . BI-VENTRICULAR PACEMAKER INSERTION N/A 01/25/2013     Procedure: BI-VENTRICULAR PACEMAKER INSERTION (CRT-P);  Surgeon: Evans Lance, MD;  Location: [facility] CATH LAB;  Service: Cardiovascular;  Laterality: N/A;  . BREAST BIOPSY Left 2002  . CARDIAC CATHETERIZATION    . CHOLECYSTECTOMY OPEN  1974  . CORONARY ARTERY BYPASS GRAFT N/A 09/28/2012   Procedure: CORONARY ARTERY BYPASS GRAFTING (CABG);  Surgeon: [name], MD;  Location: [place];  Service: Open Heart Surgery;  Laterality: N/A;  CABG x four, using left internal mammary artery and left leg greater saphenous vein harvested endoscopically  . DILATION AND CURETTAGE OF UTERUS  1983  . EPICARDIAL PACING LEAD PLACEMENT N/A 09/28/2012   Procedure: EPICARDIAL PACING LEAD PLACEMENT;  Surgeon: [name], MD;  Location: [place];  Service: Thoracic;  Laterality: N/A;  LV LEAD PLACEMENT  . HERNIA REPAIR    . INCISIONAL HERNIA REPAIR N/A 05/25/2015   Procedure: LAPAROSCOPIC INCISIONAL HERNIA WITH MESH ;  Surgeon: [name], MD;  Location: [place];  Service: General;  Laterality: N/A;  . INCONTINENCE SURGERY  2000  . INSERTION OF MESH N/A 05/25/2015   Procedure: INSERTION OF MESH;  Surgeon: [name], MD;  Location: [place];  Service: General;  Laterality: N/A;  . INTRAOPERATIVE TRANSESOPHAGEAL ECHOCARDIOGRAM N/A 09/28/2012   Procedure: INTRAOPERATIVE TRANSESOPHAGEAL ECHOCARDIOGRAM;  Surgeon: [name], MD;  Location: [place];  Service: Open Heart Surgery;  Laterality: N/A;  . LAPAROSCOPIC INCISIONAL / UMBILICAL / VENTRAL HERNIA REPAIR  05/25/2015   IHR  . LEFT HEART CATHETERIZATION WITH CORONARY ANGIOGRAM N/A 07/20/2012   Procedure: LEFT HEART CATHETERIZATION WITH CORONARY ANGIOGRAM;  Surgeon: [name], MD;  Location[facility] CATH LAB;  Service: Cardiovascular;  Laterality: N/A;  . MASTECTOMY Right 2002  . MASTECTOMY MODIFIED RADICAL W/ AXILLARY LYMPH NODES W/ OR W/O PECTORALIS MINOR Left 2002  . MAZE N/A 09/28/2012   Procedure: MAZE;  Surgeon: [name], MD;  Location: [place];  Service: Open Heart Surgery;  Laterality: N/A;  . TENDON REPAIR Right 2001 X 3-4   torn ligaments and tendons in ankle up to knee from work related accident  . TUBAL LIGATION  1987    Current Medications: Current Meds  Medication Sig  . aspirin 81 MG tablet Take 1 tablet (81 mg total) by mouth daily.  Marland Kitchen BAYER MICROLET LANCETS lancets Use as instructed  . Blood Glucose Monitoring Suppl (BAYER CONTOUR NEXT LINK) w/Device KIT Use to check blood sugar 2 times per day.  . canagliflozin (INVOKANA) 100 MG TABS tablet Take 1 tablet (100 mg total) by mouth every morning.  . carvedilol (COREG) 25 MG tablet TAKE 0.5 TABLETS (12.5 MG TOTAL) BY MOUTH 2 (TWO) TIMES DAILY.   -- NEW LOWER DOSE!!!  . Dulaglutide (TRULICITY) 1.5 BU/3.8GT SOPN INJECT 1.5 MG INTO THE SKIN ONCE A WEEK.   NEED FOLLOW UP FOR FUTURE REFILLS  . fenofibrate (TRICOR) 48 MG tablet Take 1 tablet (48 mg total) by mouth daily.  . furosemide (LASIX) 40 MG tablet TAKE ONE TABLET BY MOUTH DAILY  . glipiZIDE (GLUCOTROL) 5 MG tablet TAKE ONE-HALF TABLET BY MOUTH TWICE A DAY BEFORE A MEAL  . losartan (COZAAR) 25 MG tablet  TAKE 1 TABLET (25 MG TOTAL) BY MOUTH 2 (TWO) TIMES DAILY.  . metFORMIN (GLUCOPHAGE) 1000 MG tablet TAKE ONE TABLET BY MOUTH TWICE A DAY WITH FOOD  . nitroGLYCERIN (NITROSTAT) 0.4 MG SL tablet Place 1 tablet (0.4 mg total) under the tongue every 5 (five) minutes as needed for chest pain.  Marland Kitchen XARELTO 20 MG TABS tablet TAKE 1 TABLET (20 MG TOTAL) BY MOUTH DAILY WITH SUPPER.     Allergies:   Statins   Social History   Social History  . Marital status: Married    Spouse name: N/A  . Number of children: 3  . 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   Social History Main Topics  . Smoking status: Former Smoker    Packs/day: 1.00    Years: 10.00    Types: Cigarettes    Quit date: 03/11/2012  . Smokeless tobacco: Never Used  . Alcohol use No  . Drug use: No  . Sexual  activity: Yes    Partners: Female    Birth control/ protection: Surgical   Other Topics Concern  . None   Social History Narrative   Lives with husband and mother in [place]     Regular exercise: walking   Caffeine use: 2 cups of coffee in the morning     Family History: The patient's family history includes Arthritis in her other; Cancer in her other; Colon cancer in her sister; Diabetes in her other; Heart disease in her mother; Hyperlipidemia in her other; Kidney failure in her mother; Liver cancer in her brother. There is no history of Stomach cancer, Rectal cancer, or Esophageal cancer. ROS:   Please see the history of present illness.     All other systems reviewed and are negative.  EKGs/Labs/Other Studies Reviewed:    EKG:  EKG is  ordered today.  The ekg ordered today demonstrates Sinus rhythm with short AV delay and 100% biventricular pacing. The QRS complex is quite narrow at 116 ms with prominent R waves in leads V1 and V2. EC 507 ms  Recent Labs: 10/04/2016: ALT 16; BUN 20; Creat 1.10; Potassium 3.5; Sodium 137  Recent Lipid Panel    Component Value Date/Time   CHOL 188 04/04/2016 0723   TRIG 708 (H) 04/04/2016 0723   HDL 36 (L) 04/04/2016 0723   CHOLHDL 5.2 (H) 04/04/2016 0723   VLDL NOT CALC 04/04/2016 0723   LDLCALC NOT CALC 04/04/2016 0723   LDLDIRECT 50 04/04/2016 0723    Physical Exam:    VS:  BP 128/76   Pulse 82   Ht _0  (1.651 m)   Wt 150 lb (68 kg)   BMI 24.96 kg/m     Wt Readings from Last 3 Encounters:  11/27/16 150 lb (68 kg)  10/04/16 155 lb (70.3 kg)  03/22/16 150 lb 2 oz (68.1 kg)     GEN:  Well nourished, well developed in no acute distress HEENT: Normal NECK: No JVD; No carotid bruits LYMPHATICS: No lymphadenopathy CARDIAC: RRR, Rather loud S2, no murmurs, rubs, gallops, excellent pedal and radial pulses, healthy left subclavian pacemaker site RESPIRATORY:  Clear to auscultation without rales, wheezing or rhonchi  ABDOMEN: Soft,  non-tender, non-distended MUSCULOSKELETAL:  No edema; No deformity  SKIN: Warm and dry NEUROLOGIC:  Alert and oriented x 3 PSYCHIATRIC:  Normal affect   ASSESSMENT:    1. Chronic combined systolic and diastolic CHF, NYHA class 2 ([place])   2. Coronary  artery disease involving coronary bypass graft of native heart without angina pectoris   3. Type 2 diabetes mellitus with other circulatory complication, without long-term current use of insulin ([place])   4. PAF (paroxysmal atrial fibrillation) ([place])   5. Mixed hyperlipidemia   6. Long term current use of anticoagulant   7. Essential hypertension    PLAN:    In order of problems listed above:  1. CHF: Prior to CRT-P LVEF was 35-40% and she had a lot of problems with heart failure exacerbation, following resynchronization therapy EF was 50-55% and she has not had problems with heart failure, long time. She is on an angiotensin receptor blocker and carvedilol. 2. CAD s/p CABG: She does not have angina pectoris despite a very active life 3. DM: She is on optimal treatment for her diabetes to avoid future heart failure and acute coronary events. Glycemic control is acceptable. 4. AFib:  She did have a MAZE procedure at the time of bypass. AF has not been recorded in a very long time by her pacemaker. CHADSVasc 4 (gender, hypertension, heart failure, CAD), will increase score to 5 when she turns 66 in a few months.  5. Xarelto: Well-tolerated without bleeding problems. Temporary interruptions in anticoagulation should be very low risk. If she has bleeding complications be might even consider discontinuation of anticoagulants. 6. HLP: Lipid profile dominated by hypertriglyceridemia. She was statin intolerant secondary to myalgia, despite trying numerous agents. We had her on Repatha for a while, but coverage for this was stopped by her insurance due to increase in triglycerides. Repeat lipid profile today. 7. HTN: Excellent BP control.  . Medication  Adjustments/Labs and Tests Ordered: Current medicines are reviewed at length with the patient today.  Concerns regarding medicines are outlined above.  Orders Placed This Encounter  Procedures  . Lipid panel  . EKG 12-Lead   No orders of the defined types were placed in this encounter.   Signed, [name], MD  11/27/2016 10:43 AM    [place]

## 2016-11-27 NOTE — Patient Instructions (Signed)
Dr Sallyanne Kuster recommends that you continue on your current medications as directed. Please refer to the Current Medication list given to you today.  Your physician recommends that you return for lab work TODAY.  Remote monitoring is used to monitor your Pacemaker or ICD from home. This monitoring reduces the number of office visits required to check your device to one time per year. It allows [place] to keep an eye on the functioning of your device to ensure it is working properly. You are scheduled for a device check from home on Tuesday, December 4th, 2018. You may send your transmission at any time that day. If you have a wireless device, the transmission will be sent automatically. After your physician reviews your transmission, you will receive a notification with your next transmission date.  Dr Sallyanne Kuster recommends that you schedule a follow-up appointment in 12 months with a pacemaker check. You will receive a reminder letter in the mail two months in advance. If you don't receive a letter, please call our office to schedule the follow-up appointment.  If you need a refill on your cardiac medications before your next appointment, please call your pharmacy.

## 2016-11-28 ENCOUNTER — Encounter: Payer: Self-pay | Admitting: Family Medicine

## 2016-11-28 ENCOUNTER — Other Ambulatory Visit: Payer: Self-pay | Admitting: Pharmacist Clinician (PhC)/ Clinical Pharmacy Specialist

## 2016-11-28 MED ORDER — EVOLOCUMAB 140 MG/ML ~~LOC~~ SOAJ: 140.0000 mg | SUBCUTANEOUS | 12 refills | Status: DC

## 2016-12-02 ENCOUNTER — Other Ambulatory Visit: Payer: Self-pay | Admitting: Cardiovascular Disease

## 2016-12-02 ENCOUNTER — Telehealth: Payer: Self-pay | Admitting: Cardiovascular Disease

## 2016-12-02 NOTE — Telephone Encounter (Signed)
Agree MCr 

## 2016-12-02 NOTE — Telephone Encounter (Signed)
Pt of Dr. Sallyanne Kuster  Pt identiifies nosebleed, occurred at 4am and bled on and off for about 45 mins. Now alleviated.  Recent cold, taking OTC meds, she believes her sinuses are dried up. I explained certain meds such as decongestants can decrease moisture in nasal passages/sinuses. Advised use of nasal saline spray for relief.  She will go to urgent care if this starts again and persists greater than 30 mins.  Advised to continue Xarelto.  Encouraged her to call if further concerns or questions.

## 2016-12-02 NOTE — Telephone Encounter (Signed)
New Message  Pt c/o medication issue:  1. Name of Medication: Xarelto   2. How are you currently taking this medication (dosage and times per day)? 20mg    3. Are you having a reaction (difficulty breathing--STAT)? Nose bleed   4. What is your medication issue? Per pt call requesting to speak with RN. Pt states she was woken up out of her sleep with a nose bleed. She states she bleed off and on for 45 mins. Please call back to discuss

## 2016-12-05 ENCOUNTER — Encounter: Payer: Self-pay | Admitting: Cardiovascular Disease

## 2016-12-06 ENCOUNTER — Other Ambulatory Visit: Payer: Self-pay | Admitting: Pharmacist Clinician (PhC)/ Clinical Pharmacy Specialist

## 2016-12-06 MED ORDER — EVOLOCUMAB 140 MG/ML ~~LOC~~ SOAJ: 140.0000 mg | SUBCUTANEOUS | 12 refills | Status: DC

## 2016-12-24 ENCOUNTER — Encounter: Payer: Self-pay | Admitting: Cardiovascular Disease

## 2016-12-25 ENCOUNTER — Telehealth: Payer: Self-pay

## 2016-12-25 NOTE — Telephone Encounter (Signed)
Pt would like to change back to Dr. Sherren Mocha as PCP.   Dr. Martinique - Please advise. Thanks!

## 2016-12-30 NOTE — Telephone Encounter (Signed)
Dr. Sherren Mocha - Please advise if ok for pt to see you again for PCP. Thanks!

## 2016-12-30 NOTE — Telephone Encounter (Signed)
Per Dr. Martinique - it is okay for patient to transfer back to Dr. Sherren Mocha.

## 2017-01-01 DIAGNOSIS — D3132 Benign neoplasm of left choroid: Secondary | ICD-10-CM | POA: Diagnosis not present

## 2017-01-01 DIAGNOSIS — H26493 Other secondary cataract, bilateral: Secondary | ICD-10-CM | POA: Diagnosis not present

## 2017-01-01 DIAGNOSIS — E113393 Type 2 diabetes mellitus with moderate nonproliferative diabetic retinopathy without macular edema, bilateral: Secondary | ICD-10-CM | POA: Diagnosis not present

## 2017-01-07 ENCOUNTER — Ambulatory Visit: Payer: BLUE CROSS/BLUE SHIELD | Admitting: Internal Medicine

## 2017-01-07 NOTE — Telephone Encounter (Signed)
Dr. Martinique - Please advise on PCP change. You have approved spouse to change. Thanks!

## 2017-01-07 NOTE — Telephone Encounter (Signed)
It is fine with me. BJ 

## 2017-01-08 ENCOUNTER — Other Ambulatory Visit: Payer: Self-pay

## 2017-01-08 ENCOUNTER — Telehealth: Payer: Self-pay | Admitting: Internal Medicine

## 2017-01-08 MED ORDER — GLUCOSE BLOOD VI STRP: ORAL_STRIP | 5 refills | Status: DC

## 2017-01-08 NOTE — Telephone Encounter (Signed)
Dr. Sherren Mocha - You have accepts pt's spouse back for PCP care, ok to include wife? Thanks!

## 2017-01-08 NOTE — Telephone Encounter (Signed)
Patient requesting prescription for test strips sent  ([facility]) to pharmacy - [facility] on file ([place])

## 2017-01-10 DIAGNOSIS — E113393 Type 2 diabetes mellitus with moderate nonproliferative diabetic retinopathy without macular edema, bilateral: Secondary | ICD-10-CM | POA: Diagnosis not present

## 2017-01-10 DIAGNOSIS — H26493 Other secondary cataract, bilateral: Secondary | ICD-10-CM | POA: Diagnosis not present

## 2017-01-10 DIAGNOSIS — H52223 Regular astigmatism, bilateral: Secondary | ICD-10-CM | POA: Diagnosis not present

## 2017-01-10 DIAGNOSIS — H5213 Myopia, bilateral: Secondary | ICD-10-CM | POA: Diagnosis not present

## 2017-01-10 DIAGNOSIS — H524 Presbyopia: Secondary | ICD-10-CM | POA: Diagnosis not present

## 2017-01-10 DIAGNOSIS — D3132 Benign neoplasm of left choroid: Secondary | ICD-10-CM | POA: Diagnosis not present

## 2017-01-13 NOTE — Telephone Encounter (Signed)
Okay to except her husband also

## 2017-01-14 NOTE — Telephone Encounter (Signed)
Spoke with pt and advised. Chart has been updated to reflect Dr. Sherren Mocha as PCP. Nothing further needed at this time.

## 2017-01-14 NOTE — Telephone Encounter (Signed)
Noted  

## 2017-01-24 DIAGNOSIS — H26491 Other secondary cataract, right eye: Secondary | ICD-10-CM | POA: Diagnosis not present

## 2017-02-06 ENCOUNTER — Telehealth: Payer: Self-pay | Admitting: Cardiovascular Disease

## 2017-02-06 NOTE — Telephone Encounter (Signed)
°*  STAT* If patient is at the pharmacy, call can be transferred to refill team.   1. Which medications need to be refilled? (please list name of each medication and dose if known) furosemide 40 mg  2. Which pharmacy/location (including street and city if local pharmacy) is medication to be sent to? [place], [place] [address]  3. Do they need a 30 day or 90 day supply? 30  Patient is out medication.

## 2017-02-06 NOTE — Telephone Encounter (Signed)
Patient calling, states that she would like to know the update on her Furosemide medication. Patient states that [facility] faxed over request to our office. I advised patient that I sent back request for refill. Patient asking for call back.

## 2017-02-07 ENCOUNTER — Ambulatory Visit: Payer: BLUE CROSS/BLUE SHIELD | Admitting: Internal Medicine

## 2017-02-07 ENCOUNTER — Other Ambulatory Visit: Payer: Self-pay | Admitting: Family Medicine

## 2017-02-07 ENCOUNTER — Telehealth: Payer: Self-pay | Admitting: Family Medicine

## 2017-02-07 MED ORDER — FUROSEMIDE 40 MG PO TABS: 40.0000 mg | ORAL_TABLET | Freq: Every day | ORAL | 8 refills | Status: DC

## 2017-02-07 NOTE — Telephone Encounter (Signed)
Contacted pt request for refill on furosemide; pt notified per of previous note from 11/20/16 that a follow up appointment is needed for further refills; pt expresses her displeasure that her original request was placed on Monday, and today she was able to speak with an actual person; see encounter with agent Constance Holster; I attempted to discuss with pt options to resolve this issue, however pt further states that she no longer wants to be seen in this office because of the customer service and she will let Dr Sarajane Jews know why and ends call; will route to [facility] pool to make them aware.

## 2017-02-07 NOTE — Telephone Encounter (Signed)
Copied from [place] [id number]. Topic: Quick Communication - Rx Refill/Question >> Feb 07, 2017  9:36 AM Lennox Solders wrote: Has the patient contacted their pharmacy? yes  Pt needs a refill on furosemide 40 mg #30 w/refills Preferred Pharmacy (with phone number or street name): [facility] [phone]  Agent: Please be advised that RX refills may take up to 48 hours. We ask that you follow-up with your pharmacy.

## 2017-02-07 NOTE — Telephone Encounter (Signed)
Copied from [place]. Topic: Complaint - Care >> Feb 07, 2017  1:30 PM Pricilla Handler wrote: Patient called very upset because she had been waiting for a refill request to be sent to her pharmacy since Monday. Patient has requested to speak with the practice administrator. Patient wants to speak with him only. Please ask Norlene Duel to call patient ASAP. Patient stated that she is very distraught and disappointed in the lack of concern that has been displayed.

## 2017-02-11 ENCOUNTER — Ambulatory Visit (INDEPENDENT_AMBULATORY_CARE_PROVIDER_SITE_OTHER): Payer: BLUE CROSS/BLUE SHIELD | Admitting: *Deleted

## 2017-02-11 DIAGNOSIS — I480 Paroxysmal atrial fibrillation: Secondary | ICD-10-CM | POA: Diagnosis not present

## 2017-02-11 LAB — CUP PACEART REMOTE DEVICE CHECK
Battery Voltage: 2.95 V
Brady Statistic AP VS Percent: 1 %
Brady Statistic AS VP Percent: 99 %
Brady Statistic AS VS Percent: 1 %
Brady Statistic RA Percent Paced: 1 %
Implantable Lead Implant Date: 20140721
Implantable Lead Implant Date: 20141117
Implantable Lead Implant Date: 20141117
Implantable Lead Location: 753858
Implantable Lead Location: 753860
Implantable Lead Model: 5071
Lead Channel Impedance Value: 410 Ohm
Lead Channel Impedance Value: 460 Ohm
Lead Channel Impedance Value: 540 Ohm
Lead Channel Pacing Threshold Amplitude: 0.5 V
Lead Channel Pacing Threshold Amplitude: 0.75 V
Lead Channel Pacing Threshold Amplitude: 1.5 V
Lead Channel Sensing Intrinsic Amplitude: 10.4 mV
Lead Channel Setting Pacing Amplitude: 2 V
Lead Channel Setting Pacing Pulse Width: 0.4 ms
Lead Channel Setting Pacing Pulse Width: 0.5 ms
Lead Channel Setting Sensing Sensitivity: 2 mV
MDC IDC LEAD LOCATION: 753859
MDC IDC MSMT BATTERY REMAINING LONGEVITY: 76 mo
MDC IDC MSMT BATTERY REMAINING PERCENTAGE: 89 %
MDC IDC MSMT LEADCHNL LV PACING THRESHOLD PULSEWIDTH: 0.5 ms
MDC IDC MSMT LEADCHNL RA PACING THRESHOLD PULSEWIDTH: 0.4 ms
MDC IDC MSMT LEADCHNL RA SENSING INTR AMPL: 2 mV
MDC IDC MSMT LEADCHNL RV PACING THRESHOLD PULSEWIDTH: 0.4 ms
MDC IDC PG IMPLANT DT: 20141117
MDC IDC PG SERIAL: 2981305
MDC IDC SESS DTM: 20181204070014
MDC IDC SET LEADCHNL LV PACING AMPLITUDE: 2.25 V
MDC IDC SET LEADCHNL RV PACING AMPLITUDE: 2.5 V
MDC IDC STAT BRADY AP VP PERCENT: 1 %

## 2017-02-11 NOTE — Progress Notes (Signed)
Remote pacemaker transmission.   

## 2017-02-12 ENCOUNTER — Encounter: Payer: Self-pay | Admitting: Cardiology

## 2017-02-18 NOTE — Telephone Encounter (Signed)
Patient spoke w/ Excell Seltzer, practice administrator.

## 2017-02-20 ENCOUNTER — Other Ambulatory Visit: Payer: Self-pay | Admitting: Pharmacist Clinician (PhC)/ Clinical Pharmacy Specialist

## 2017-02-20 DIAGNOSIS — E785 Hyperlipidemia, unspecified: Secondary | ICD-10-CM

## 2017-03-17 ENCOUNTER — Other Ambulatory Visit: Payer: Self-pay | Admitting: Pharmacist

## 2017-03-17 MED ORDER — EVOLOCUMAB 140 MG/ML ~~LOC~~ SOAJ: 140.0000 mg | SUBCUTANEOUS | 11 refills | Status: DC

## 2017-03-26 ENCOUNTER — Encounter: Payer: Self-pay | Admitting: Cardiovascular Disease

## 2017-04-01 ENCOUNTER — Encounter: Payer: Self-pay | Admitting: Pharmacist

## 2017-04-03 ENCOUNTER — Other Ambulatory Visit: Payer: Self-pay | Admitting: Pharmacist

## 2017-04-03 MED ORDER — EVOLOCUMAB 140 MG/ML ~~LOC~~ SOAJ: 140.0000 mg | SUBCUTANEOUS | 11 refills | Status: DC

## 2017-04-20 ENCOUNTER — Encounter: Payer: Self-pay | Admitting: Cardiovascular Disease

## 2017-04-21 ENCOUNTER — Other Ambulatory Visit: Payer: Self-pay

## 2017-04-21 MED ORDER — CARVEDILOL 25 MG PO TABS: ORAL_TABLET | ORAL | 4 refills | Status: DC

## 2017-04-21 MED ORDER — LOSARTAN POTASSIUM 25 MG PO TABS: 25.0000 mg | ORAL_TABLET | Freq: Two times a day (BID) | ORAL | 4 refills | Status: DC

## 2017-04-21 MED ORDER — RIVAROXABAN 20 MG PO TABS: 20.0000 mg | ORAL_TABLET | Freq: Every day | ORAL | 1 refills | Status: DC

## 2017-04-28 ENCOUNTER — Ambulatory Visit: Payer: BLUE CROSS/BLUE SHIELD | Admitting: Internal Medicine

## 2017-04-28 NOTE — Progress Notes (Deleted)
Patient ID: [name], female   DOB: 1950/08/20, 67 y.o.   MRN: [id number]  HPI: [name] is a 67 y.o.-year-old female, returning for f/u for DM2, dx 2002, insulin-independent, uncontrolled, with complications (CAD, s/p CABG) and vitamin D deficiency. Last visit 7 months ago.  DM2: Last hemoglobin A1c was: Lab Results  Component Value Date   HGBA1C 6.9 10/04/2016   HGBA1C 6.5 02/22/2016   HGBA1C 6.5 (H) 08/11/2015  Patient has been on insulin before >> hypoglycemia.   Pt is on a regimen of: - Metformin 2000 mg with dinner - Glipizide 2.5 mg before breakfast and dinner - Invokana 100 mg in a.m. -no UTIs or recent infections - Trulicity 1.5 mg daily weekly Could not afford Januvia 100 mg daily >> 100$ reduced from 348%. She was on Amaryl 2 mg bid. She was previously on Bydureon.  Pt checks her sugars 1x a day: - am: 119-151, 185 >> 90s, 178 - before lunch: 93, 122 >> 70-80s (took Glipizide and did not eat) - after lunch: 92-157 >> 101-106 >> n/c - before dinner: 108-117 >> 100-115 - bedtime: n/c >> 101-139 >> n/c Lowest sugar was 93 >> 70 >> ***; she has hypoglycemia awareness  in the 60s. Highest sugar was 185 >> 178 >> ***.  -No history of CKD, last BUN/creatinine was higher, though:  Lab Results  Component Value Date   BUN 20 10/04/2016   CREATININE 1.10 (H) 10/04/2016  On losartan. -+ HL; last set of lipids: Lab Results  Component Value Date   CHOL 203 (H) 11/27/2016   HDL 52 11/27/2016   LDLCALC 102 (H) 11/27/2016   LDLDIRECT 50 04/04/2016   TRIG 244 (H) 11/27/2016   CHOLHDL 3.9 11/27/2016  She is off Repatha b/c cost - started 10/2015.  She has an allergy to statins. - last eye exam was 06/2016: No DR. Has cataracts - had surgery in 10/2015.  - no numbness and tingling in her feet. On ASA 81.  History of vitamin D deficiency: Previous levels reviewed: Lab Results  Component Value Date   VD25OH 26.08 (L) 10/04/2016   VD25OH 38.79 02/22/2016   VD25OH 12.35 (L) 10/10/2015   At last visit, we increased the dose to 5000 units daily  She has a h/o BrCA  - s/p B mastectomy.  ROS: Constitutional: no weight gain/no weight loss, no fatigue, no subjective hyperthermia, no subjective hypothermia Eyes: no blurry vision, no xerophthalmia ENT: no sore throat, no nodules palpated in throat, no dysphagia, no odynophagia, no hoarseness Cardiovascular: no CP/no SOB/no palpitations/no leg swelling Respiratory: no cough/no SOB/no wheezing Gastrointestinal: no N/no V/no D/no C/no acid reflux Musculoskeletal: no muscle aches/no joint aches Skin: no rashes, no hair loss Neurological: no tremors/no numbness/no tingling/no dizziness  I reviewed pt's medications, allergies, PMH, social hx, family hx, and changes were documented in the history of present illness. Otherwise, unchanged from my initial visit note.  PE: There were no vitals taken for this visit. There is no height or weight on file to calculate BMI.  Wt Readings from Last 3 Encounters:  11/27/16 150 lb (68 kg)  10/04/16 155 lb (70.3 kg)  03/22/16 150 lb 2 oz (68.1 kg)   Constitutional: Slightly overweight, in NAD Eyes: PERRLA, EOMI, no exophthalmos ENT: moist mucous membranes, no thyromegaly, no cervical lymphadenopathy Cardiovascular: RRR, No MRG Respiratory: CTA B Gastrointestinal: abdomen soft, NT, ND, BS+ Musculoskeletal: no deformities, strength intact in all 4 Skin: moist, warm, no rashes Neurological: no tremor with outstretched hands,  DTR normal in all 4  ASSESSMENT: 1. 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 Vitamin D deficiency  3. HL  PLAN:  1. Patient with long-standing, uncontrolled diabetes, on oral antidiabetic regimen and GLP-1 receptor agonist.  At last visit, HbA1c was worse, 6.9%, although not quite obvious from her  sugar log.  She has not been eating well before last visit. - We did not make changes in her regimen at last visit, pending an improvement in her diet  - I advised her to: Patient Instructions  Please continue: - Metformin 2000 mg with dinner - Glipizide 2.5 mg before breakfast and dinner - Invokana 100 mg in a.m. - Trulicity 1.5 mg daily weekly  Please stop at the lab.  Please come back for a follow-up appointment in 3 months.  - today, HbA1c is 7%  - continue checking sugars at different times of the day - check 1-2x a day, rotating checks - advised for yearly eye exams >> she is UTD - as kidney function was slightly low at last visit, we will repeat it today - Return to clinic in 3 mo with sugar log   2. Vitamin D deficiency - latest level was slightly low 7 months ago -We increased the dose to 5000 units daily -She continues on this dose -We will recheck her level today  3. HL - reviewed lipid panel from 11/2016: LDL much worse -She is off statin due to previous intolerance -She was on PCSK9 inhibitor, now not covered by insurance   Philemon Kingdom, MD PhD [facility] Endocrinology

## 2017-04-29 ENCOUNTER — Other Ambulatory Visit: Payer: Self-pay | Admitting: Internal Medicine

## 2017-04-29 ENCOUNTER — Ambulatory Visit: Payer: BLUE CROSS/BLUE SHIELD | Admitting: Internal Medicine

## 2017-04-29 ENCOUNTER — Encounter: Payer: Self-pay | Admitting: Internal Medicine

## 2017-04-29 VITALS — BP 132/84 | HR 82 | Ht 65.0 in | Wt 151.6 lb

## 2017-04-29 DIAGNOSIS — E559 Vitamin D deficiency, unspecified: Secondary | ICD-10-CM | POA: Diagnosis not present

## 2017-04-29 DIAGNOSIS — E1159 Type 2 diabetes mellitus with other circulatory complications: Secondary | ICD-10-CM | POA: Diagnosis not present

## 2017-04-29 DIAGNOSIS — E782 Mixed hyperlipidemia: Secondary | ICD-10-CM

## 2017-04-29 LAB — POCT GLYCOSYLATED HEMOGLOBIN (HGB A1C): HEMOGLOBIN A1C: 7.3

## 2017-04-29 MED ORDER — METFORMIN HCL 1000 MG PO TABS: ORAL_TABLET | ORAL | 11 refills | Status: DC

## 2017-04-29 MED ORDER — GLIPIZIDE 5 MG PO TABS: ORAL_TABLET | ORAL | 11 refills | Status: DC

## 2017-04-29 MED ORDER — CANAGLIFLOZIN 100 MG PO TABS: 100.0000 mg | ORAL_TABLET | Freq: Every morning | ORAL | 11 refills | Status: DC

## 2017-04-29 MED ORDER — DULAGLUTIDE 1.5 MG/0.5ML ~~LOC~~ SOAJ: SUBCUTANEOUS | 11 refills | Status: DC

## 2017-04-29 NOTE — Progress Notes (Signed)
Patient ID: [name], female   DOB: 20-Apr-1950, 67 y.o.   MRN: [id number]  HPI: [name] is a 67 y.o.-year-old female, returning for f/u for DM2, dx 2002, insulin-independent, uncontrolled, with complications (CAD, s/p CABG) and vitamin D deficiency. Last visit 7 months ago.  She was more depressed during the winter >> sugars higher. She restarted exercise recently.  DM2: Last hemoglobin A1c was: Lab Results  Component Value Date   HGBA1C 7.3 04/29/2017   HGBA1C 6.9 10/04/2016   HGBA1C 6.5 02/22/2016  Patient has been on insulin before >> hypoglycemia.   Pt is on a regimen of: - Metformin 2000 mg with dinner - Glipizide 2.5 mg before breakfast and dinner - Invokana 100 mg in a.m. -no UTIs or recent infections - Trulicity 1.5 mg daily weekly Could not afford Januvia 100 mg daily >> 100$ reduced from 348%. She was on Amaryl 2 mg bid. She was previously on Bydureon.  Pt checks her sugars once a day: - am: 119-151, 185 >> 90s, 178 >> 150-208 - after b'fast; >> 155-199, 235 - before lunch: 93, 122 >> 70-80s >> 81-150, 171 - after lunch: 92-157 >> 101-106 >> n/c >> 165-209 - before dinner: 108-117 >> 100-115 >> 119, 131-187 - after dinner:  128-197 - bedtime: n/c >> 101-139 >> n/c >> 179, 205, 260 Lowest sugar was 93 >> 70 >> 81; she has hypoglycemia awareness in the 60s. Highest sugar was 185 >> 178 >> 200s.  -No history of CKD, last BUN/creatinine was higher, though: Lab Results  Component Value Date   BUN 20 10/04/2016   CREATININE 1.10 (H) 10/04/2016  On losartan. -+ HL; last set of lipids: Lab Results  Component Value Date   CHOL 203 (H) 11/27/2016   HDL 52 11/27/2016   LDLCALC 102 (H) 11/27/2016   LDLDIRECT 50 04/04/2016   TRIG 244 (H) 11/27/2016   CHOLHDL 3.9 11/27/2016  She is back on Repatha- initially started in 10/2015, then had to stop due to lack of insurance coverage.  She has an intolerance to statins.  She is seeing Dr. Sallyanne Kuster. - last eye exam  was 06/2016: No DR. Has cataracts - had surgery in 10/2015. Had Laser Sx. -No numbness and tingling in her feet. On ASA 81.  History of vitamin D deficiency: Previous levels reviewed: Last level was slightly low: Lab Results  Component Value Date   VD25OH 26.08 (L) 10/04/2016   VD25OH 38.79 02/22/2016   VD25OH 12.35 (L) 10/10/2015   At last visit, we increased the dose to 5000 units daily.  She continues on this.  She has a h/o BrCA  - s/p B mastectomy.  ROS: Constitutional: no weight gain/no weight loss, no fatigue, no subjective hyperthermia, no subjective hypothermia Eyes: no blurry vision, no xerophthalmia ENT: no sore throat, no nodules palpated in throat, no dysphagia, no odynophagia, no hoarseness Cardiovascular: no CP/no SOB/no palpitations/no leg swelling Respiratory: no cough/no SOB/no wheezing Gastrointestinal: no N/no V/no D/no C/no acid reflux Musculoskeletal: no muscle aches/no joint aches Skin: no rashes, no hair loss Neurological: no tremors/no numbness/no tingling/no dizziness  I reviewed pt's medications, allergies, PMH, social hx, family hx, and changes were documented in the history of present illness. Otherwise, unchanged from my initial visit note.  PE: BP 132/84 (BP Location: Left Arm, Patient Position: Sitting, Cuff Size: Large)   Pulse 82   Ht _0  (1.651 m)   Wt 151 lb 9.6 oz (68.8 kg)   BMI 25.23 kg/m  Body mass  index is 25.23 kg/m.  Wt Readings from Last 3 Encounters:  04/29/17 151 lb 9.6 oz (68.8 kg)  11/27/16 150 lb (68 kg)  10/04/16 155 lb (70.3 kg)   Constitutional: overweight, in NAD Eyes: PERRLA, EOMI, no exophthalmos ENT: moist mucous membranes, no thyromegaly, no cervical lymphadenopathy Cardiovascular: RRR, No MRG Respiratory: CTA B Gastrointestinal: abdomen soft, NT, ND, BS+ Musculoskeletal: no deformities, strength intact in all 4 Skin: moist, warm, no rashes Neurological: no tremor with outstretched hands, DTR normal in all  4  ASSESSMENT: 1. 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 Vitamin D deficiency  3. HL  PLAN:  1. Patient with long-standing, uncontrolled, diabetes, on oral antidiabetic regimen and GLP-1 receptor agonist.  At last visit, HbA1c was worse, 6.9%, although not quite obvious from her sugar log.  At that time, we did not make any changes in her regimen pending an improvement in her diet. - At this visit, sugars are higher, with more spikes in the 200s.  Since these are mostly after meals, we discussed about increasing glipizide.  We also discussed about improving her meals, which would be key in controlling her diabetes. - I advised her to: Patient Instructions  Please continue: - Metformin 2000 mg with dinner - Invokana 100 mg in a.m. - Trulicity 1.5 mg daily weekly  Please increase: - Glipizide to 5 mg before breakfast and dinner  Please read:  The Alzheimer's Solution: A Breakthrough Program to Prevent and Reverse the Symptoms of Cognitive Decline at Every Age by Ardeen Garland, Charlann Lange   Please come back for a follow-up appointment in 3 months.  - today, HbA1c is 7.3% (higher) - continue checking sugars at different times of the day - check 1-2x a day, rotating checks - advised for yearly eye exams >> she is UTD - Return to clinic in 3 mo with sugar log   2. Vitamin D deficiency - Latest level was slightly low 7 months ago and at that time we increased the dose to 5000 units daily.  She continues on this dose.  We will need to repeat her level  -at next visit  3. HL - We reviewed together her latest lipid panel from 11/2016: LDL much worse.  She is off statin due to previous intolerance.  She is now back on PCSK 9 inhibitors.   Philemon Kingdom, MD PhD [facility] Endocrinology

## 2017-04-29 NOTE — Patient Instructions (Addendum)
Please continue: - Metformin 2000 mg with dinner - Invokana 100 mg in a.m. - Trulicity 1.5 mg daily weekly  Please increase: - Glipizide to 5 mg before breakfast and dinner  Please read:  The Alzheimer's Solution: A Breakthrough Program to Prevent and Reverse the Symptoms of Cognitive Decline at Every Age by Ardeen Garland, Charlann Lange   Please come back for a follow-up appointment in 3 months.

## 2017-04-30 ENCOUNTER — Encounter: Payer: Self-pay | Admitting: Internal Medicine

## 2017-05-12 ENCOUNTER — Encounter: Payer: Self-pay | Admitting: Cardiovascular Disease

## 2017-05-13 ENCOUNTER — Ambulatory Visit (INDEPENDENT_AMBULATORY_CARE_PROVIDER_SITE_OTHER): Payer: BLUE CROSS/BLUE SHIELD | Admitting: *Deleted

## 2017-05-13 ENCOUNTER — Encounter: Payer: Self-pay | Admitting: Family Medicine

## 2017-05-13 DIAGNOSIS — I255 Ischemic cardiomyopathy: Secondary | ICD-10-CM | POA: Diagnosis not present

## 2017-05-13 DIAGNOSIS — I480 Paroxysmal atrial fibrillation: Secondary | ICD-10-CM

## 2017-05-13 NOTE — Progress Notes (Signed)
Remote pacemaker transmission.   

## 2017-05-14 ENCOUNTER — Encounter: Payer: Self-pay | Admitting: Cardiology

## 2017-05-14 NOTE — Progress Notes (Signed)
Letter  

## 2017-05-15 ENCOUNTER — Telehealth: Payer: Self-pay

## 2017-05-15 LAB — CUP PACEART REMOTE DEVICE CHECK
Battery Remaining Longevity: 77 mo
Brady Statistic AP VP Percent: 1 %
Brady Statistic AS VP Percent: 99 %
Brady Statistic AS VS Percent: 1 %
Date Time Interrogation Session: 20190305070012
Implantable Lead Implant Date: 20140721
Implantable Lead Implant Date: 20141117
Implantable Lead Location: 753859
Implantable Lead Location: 753860
Lead Channel Impedance Value: 600 Ohm
Lead Channel Pacing Threshold Amplitude: 0.75 V
Lead Channel Pacing Threshold Amplitude: 1.5 V
Lead Channel Pacing Threshold Pulse Width: 0.4 ms
Lead Channel Pacing Threshold Pulse Width: 0.5 ms
Lead Channel Setting Pacing Amplitude: 2 V
Lead Channel Setting Sensing Sensitivity: 2 mV
MDC IDC LEAD IMPLANT DT: 20141117
MDC IDC LEAD LOCATION: 753858
MDC IDC MSMT BATTERY REMAINING PERCENTAGE: 89 %
MDC IDC MSMT BATTERY VOLTAGE: 2.95 V
MDC IDC MSMT LEADCHNL LV IMPEDANCE VALUE: 400 Ohm
MDC IDC MSMT LEADCHNL RA IMPEDANCE VALUE: 450 Ohm
MDC IDC MSMT LEADCHNL RA PACING THRESHOLD AMPLITUDE: 0.5 V
MDC IDC MSMT LEADCHNL RA PACING THRESHOLD PULSEWIDTH: 0.4 ms
MDC IDC MSMT LEADCHNL RA SENSING INTR AMPL: 2.3 mV
MDC IDC MSMT LEADCHNL RV SENSING INTR AMPL: 12 mV
MDC IDC PG IMPLANT DT: 20141117
MDC IDC SET LEADCHNL LV PACING AMPLITUDE: 2.25 V
MDC IDC SET LEADCHNL LV PACING PULSEWIDTH: 0.5 ms
MDC IDC SET LEADCHNL RV PACING AMPLITUDE: 2.5 V
MDC IDC SET LEADCHNL RV PACING PULSEWIDTH: 0.4 ms
MDC IDC STAT BRADY AP VS PERCENT: 1 %
MDC IDC STAT BRADY RA PERCENT PACED: 1 %
Pulse Gen Model: 3222
Pulse Gen Serial Number: 2981305

## 2017-05-15 NOTE — Telephone Encounter (Signed)
Spoke with pt pharmacy regarding to pt concerns on the recall on Losartan medication, pharmacy states that they did not have any of the lot that was on recall, provided the pt with a hotline number to confirm of the recall but pt states that she was not able to get through the call. Pharmacy also states that they have same medication from a different [place] if pt wants to change the [employer]. Spoke with pt sated that she wants dr Sherren Mocha to switch her to a different blood pressure if possible.  Pt voiced understanding that Dr Sherren Mocha is not in the office and that I will let him know of the request on Monday.

## 2017-06-09 ENCOUNTER — Encounter: Payer: BLUE CROSS/BLUE SHIELD | Admitting: Family Medicine

## 2017-06-13 ENCOUNTER — Telehealth: Payer: Self-pay | Admitting: Pharmacist

## 2017-06-13 NOTE — Telephone Encounter (Signed)
Patient overdue to repeat blood work for [place] follow up.  Need recent lipid panel prior to prior-authorization renewal.

## 2017-06-16 ENCOUNTER — Ambulatory Visit: Payer: BLUE CROSS/BLUE SHIELD | Admitting: Family Medicine

## 2017-06-23 ENCOUNTER — Encounter: Payer: BLUE CROSS/BLUE SHIELD | Admitting: Family Medicine

## 2017-06-25 ENCOUNTER — Encounter (INDEPENDENT_AMBULATORY_CARE_PROVIDER_SITE_OTHER): Payer: Self-pay

## 2017-06-26 DIAGNOSIS — E785 Hyperlipidemia, unspecified: Secondary | ICD-10-CM | POA: Diagnosis not present

## 2017-06-26 LAB — LIPID PANEL
Chol/HDL Ratio: 1.9 ratio (ref 0.0–4.4)
Cholesterol, Total: 116 mg/dL (ref 100–199)
HDL: 61 mg/dL (ref >39)
LDL CALC: 25 mg/dL (ref 0–99)
Triglycerides: 151 mg/dL — ABNORMAL HIGH (ref 0–149)
VLDL Cholesterol Cal: 30 mg/dL (ref 5–40)

## 2017-07-01 ENCOUNTER — Encounter: Payer: Self-pay | Admitting: Family Medicine

## 2017-07-01 ENCOUNTER — Ambulatory Visit (INDEPENDENT_AMBULATORY_CARE_PROVIDER_SITE_OTHER): Payer: BLUE CROSS/BLUE SHIELD | Admitting: Family Medicine

## 2017-07-01 VITALS — BP 102/62 | HR 96 | Temp 98.4°F | Ht 65.0 in | Wt 150.0 lb

## 2017-07-01 DIAGNOSIS — Z0000 Encounter for general adult medical examination without abnormal findings: Secondary | ICD-10-CM | POA: Diagnosis not present

## 2017-07-01 DIAGNOSIS — E1159 Type 2 diabetes mellitus with other circulatory complications: Secondary | ICD-10-CM

## 2017-07-01 DIAGNOSIS — I10 Essential (primary) hypertension: Secondary | ICD-10-CM | POA: Diagnosis not present

## 2017-07-01 DIAGNOSIS — Z23 Encounter for immunization: Secondary | ICD-10-CM | POA: Diagnosis not present

## 2017-07-01 DIAGNOSIS — Z853 Personal history of malignant neoplasm of breast: Secondary | ICD-10-CM | POA: Diagnosis not present

## 2017-07-01 LAB — POCT URINALYSIS DIPSTICK
BILIRUBIN UA: NEGATIVE
KETONES UA: NEGATIVE
Leukocytes, UA: NEGATIVE
Nitrite, UA: NEGATIVE
Protein, UA: NEGATIVE
RBC UA: NEGATIVE
Spec Grav, UA: 1.01 (ref 1.010–1.025)
Urobilinogen, UA: 0.2 E.U./dL
pH, UA: 6 (ref 5.0–8.0)

## 2017-07-01 LAB — BASIC METABOLIC PANEL
BUN: 19 mg/dL (ref 6–23)
CALCIUM: 10 mg/dL (ref 8.4–10.5)
CO2: 31 meq/L (ref 19–32)
Chloride: 96 mEq/L (ref 96–112)
Creatinine, Ser: 0.93 mg/dL (ref 0.40–1.20)
GFR: 64.24 mL/min (ref >60.00)
GLUCOSE: 232 mg/dL — AB (ref 70–99)
POTASSIUM: 3.9 meq/L (ref 3.5–5.1)
SODIUM: 134 meq/L — AB (ref 135–145)

## 2017-07-01 LAB — CBC WITH DIFFERENTIAL/PLATELET
BASOS PCT: 0.4 % (ref 0.0–3.0)
Basophils Absolute: 0 10*3/uL (ref 0.0–0.1)
EOS ABS: 0.2 10*3/uL (ref 0.0–0.7)
EOS PCT: 2.6 % (ref 0.0–5.0)
HCT: 40.1 % (ref 36.0–46.0)
Hemoglobin: 13.6 g/dL (ref 12.0–15.0)
LYMPHS ABS: 1.7 10*3/uL (ref 0.7–4.0)
Lymphocytes Relative: 23.1 % (ref 12.0–46.0)
MCHC: 33.9 g/dL (ref 30.0–36.0)
MCV: 91.5 fl (ref 78.0–100.0)
MONO ABS: 0.5 10*3/uL (ref 0.1–1.0)
Monocytes Relative: 6 % (ref 3.0–12.0)
NEUTROS ABS: 5.1 10*3/uL (ref 1.4–7.7)
Neutrophils Relative %: 67.9 % (ref 43.0–77.0)
PLATELETS: 247 10*3/uL (ref 150.0–400.0)
RBC: 4.38 Mil/uL (ref 3.87–5.11)
RDW: 13.3 % (ref 11.5–15.5)
WBC: 7.6 10*3/uL (ref 4.0–10.5)

## 2017-07-01 LAB — MICROALBUMIN / CREATININE URINE RATIO
Creatinine,U: 38.4 mg/dL
MICROALB/CREAT RATIO: 1.8 mg/g (ref 0.0–30.0)

## 2017-07-01 LAB — TSH: TSH: 1.5 u[IU]/mL (ref 0.35–4.50)

## 2017-07-01 MED ORDER — METFORMIN HCL 1000 MG PO TABS: ORAL_TABLET | ORAL | 4 refills | Status: DC

## 2017-07-01 MED ORDER — GLIPIZIDE 5 MG PO TABS: ORAL_TABLET | ORAL | 4 refills | Status: DC

## 2017-07-01 MED ORDER — FUROSEMIDE 40 MG PO TABS: 40.0000 mg | ORAL_TABLET | Freq: Every day | ORAL | 4 refills | Status: DC

## 2017-07-01 NOTE — Patient Instructions (Signed)
Labs today....... however, wife is anything abnormal  Call your insurance company to find or you get the new shingles vaccine  Continue good diet and daily exercise  Follow-up in one year for general physical exam sooner if any problems

## 2017-07-01 NOTE — Progress Notes (Signed)
[name] is a 67 year old married female nonsmoker who comes in today for general physical examination because of a history of hypertension diabetes hyperlipidemia coronary disease  She had a MI in [place] in 2004. She had another MI here in [facility] 2014 and had bypass surgery. After that surgery she quit smoking. She paid much more pension to her health however her diabetes was never at goal. We therefore send her to see Dr. Lorie Apley. Since that time her blood sugars have been under much better control. Her last A1c was 7.3 in February 2019.  For hypertension she takes Cozaar 25 mg daily and 40 mg of Lasix daily. BP today here 102/62  She also takes cord 25 mg one half tab twice a day.  She is on chronic blood thinners.  For hyperlipidemia because she is intolerant of statins she is on injectable Repatha every 14 days  She had bilateral mastectomies with implants done many years ago for breast cancer. She had her uterus and ovaries removed years ago for nonmalignant reasons. Said she has some dysplastic cells in her cervix therefore Paps and pelvics no longer indicated  She had an incisional hernia fixed year ago but it's recurred. Advised a lumbar back brace to support her abdominal wall  She had both cataracts removed and lens implants by Dr. Bing Plume last summer.  She gets routine eye care, dental care no......... scheduled to see a dentist in June  Her colonoscopy was 2012 and it was normal. Because she's had both breast removed mammograms no longer indicated.  Vaccinations up-to-date information given on shingles. Pneumovax given today.  14 point review of systems otherwise negative  BP 102/62 (BP Location: Right Arm, Patient Position: Sitting, Cuff Size: Normal)   Pulse 96   Temp 98.4 F (36.9 C) (Oral)   Ht 5\' 5"  (1.651 m)   Wt 150 lb (68 kg)   BMI 24.96 kg/m  Well-developed well-nourished female no acute distress vital signs stable she's afebrile HEENT pertinent she has  bilateral lens implants. Neck was supple thyroid not large no carotid bruits. Cardiopulmonary exam normal. Breast exam shows reconstructed breast without palpable masses. Abdominal exam scars from previous surgery ventral hernia from umbilicus to pubis. Otherwise abdominal exam normal. Pelvic and rectal not indicated. Extremities normal skin normal peripheral pulses normal  #1 diabetes under much better control with Dr. Delila Pereyra guidance....... continue follow-up by her  #2 hypertension ago..... Continue current therapy  #3 hyperlipidemia........ continue injections every 14 days  #4 coronary disease status post bypass surgery 5 years ago...Marland KitchenMarland KitchenMarland Kitchen Asymptomatic..... Continue daily exercise and control risk factors  #5 status post breast cancer with bilateral mastectomies and reconstructive surgery.  #6 ventral hernia........ lumbar back brace for abdominal support

## 2017-07-28 ENCOUNTER — Ambulatory Visit: Payer: BLUE CROSS/BLUE SHIELD | Admitting: Internal Medicine

## 2017-08-06 DIAGNOSIS — Z961 Presence of intraocular lens: Secondary | ICD-10-CM | POA: Diagnosis not present

## 2017-08-06 DIAGNOSIS — E113393 Type 2 diabetes mellitus with moderate nonproliferative diabetic retinopathy without macular edema, bilateral: Secondary | ICD-10-CM | POA: Diagnosis not present

## 2017-08-06 DIAGNOSIS — D3132 Benign neoplasm of left choroid: Secondary | ICD-10-CM | POA: Diagnosis not present

## 2017-08-12 ENCOUNTER — Ambulatory Visit (INDEPENDENT_AMBULATORY_CARE_PROVIDER_SITE_OTHER): Payer: BLUE CROSS/BLUE SHIELD | Admitting: *Deleted

## 2017-08-12 DIAGNOSIS — I255 Ischemic cardiomyopathy: Secondary | ICD-10-CM

## 2017-08-12 NOTE — Progress Notes (Signed)
Remote pacemaker transmission.   

## 2017-09-02 LAB — CUP PACEART REMOTE DEVICE CHECK
Battery Remaining Percentage: 80 %
Battery Voltage: 2.93 V
Date Time Interrogation Session: 20190604060012
Implantable Lead Implant Date: 20140721
Implantable Lead Implant Date: 20141117
Implantable Lead Implant Date: 20141117
Implantable Lead Location: 753859
Implantable Lead Model: 5071
Implantable Pulse Generator Implant Date: 20141117
Lead Channel Pacing Threshold Amplitude: 0.5 V
Lead Channel Pacing Threshold Pulse Width: 0.4 ms
Lead Channel Pacing Threshold Pulse Width: 0.4 ms
Lead Channel Pacing Threshold Pulse Width: 0.5 ms
Lead Channel Sensing Intrinsic Amplitude: 1.4 mV
Lead Channel Setting Pacing Amplitude: 2 V
Lead Channel Setting Pacing Amplitude: 2.25 V
Lead Channel Setting Pacing Amplitude: 2.5 V
Lead Channel Setting Pacing Pulse Width: 0.5 ms
MDC IDC LEAD LOCATION: 753858
MDC IDC LEAD LOCATION: 753860
MDC IDC MSMT BATTERY REMAINING LONGEVITY: 68 mo
MDC IDC MSMT LEADCHNL LV IMPEDANCE VALUE: 400 Ohm
MDC IDC MSMT LEADCHNL LV PACING THRESHOLD AMPLITUDE: 1.5 V
MDC IDC MSMT LEADCHNL RA IMPEDANCE VALUE: 440 Ohm
MDC IDC MSMT LEADCHNL RV IMPEDANCE VALUE: 560 Ohm
MDC IDC MSMT LEADCHNL RV PACING THRESHOLD AMPLITUDE: 0.75 V
MDC IDC MSMT LEADCHNL RV SENSING INTR AMPL: 12 mV
MDC IDC SET LEADCHNL RV PACING PULSEWIDTH: 0.4 ms
MDC IDC SET LEADCHNL RV SENSING SENSITIVITY: 2 mV
MDC IDC STAT BRADY AP VP PERCENT: 1 %
MDC IDC STAT BRADY AP VS PERCENT: 1 %
MDC IDC STAT BRADY AS VP PERCENT: 99 %
MDC IDC STAT BRADY AS VS PERCENT: 1 %
MDC IDC STAT BRADY RA PERCENT PACED: 1 %
Pulse Gen Model: 3222
Pulse Gen Serial Number: 2981305

## 2017-10-13 ENCOUNTER — Other Ambulatory Visit: Payer: Self-pay | Admitting: Internal Medicine

## 2017-10-23 ENCOUNTER — Telehealth: Payer: Self-pay | Admitting: Pharmacist

## 2017-10-23 NOTE — Telephone Encounter (Signed)
Medication Samples have been provided to the patient.  Drug name: Repatha     Strength: 140mg  Qty: #9     [id number]    Exp.Date: 01/2020  The patient has been instructed regarding the correct time, dose, and frequency of taking this medication, including desired effects and most common side effects.   Lewayne Pauley Rodriguez-Guzman PharmD, BCPS, [place] [id number] 10/23/2017 3:54 PM

## 2017-10-28 ENCOUNTER — Ambulatory Visit (INDEPENDENT_AMBULATORY_CARE_PROVIDER_SITE_OTHER): Payer: BLUE CROSS/BLUE SHIELD

## 2017-10-28 ENCOUNTER — Other Ambulatory Visit: Payer: Self-pay

## 2017-10-28 ENCOUNTER — Encounter (HOSPITAL_COMMUNITY): Payer: Self-pay

## 2017-10-28 ENCOUNTER — Ambulatory Visit (HOSPITAL_COMMUNITY)
Admission: EM | Admit: 2017-10-28 | Discharge: 2017-10-28 | Disposition: A | Discharge status: Home / Self Care | Payer: BLUE CROSS/BLUE SHIELD | Attending: Family Medicine | Admitting: Family Medicine

## 2017-10-28 DIAGNOSIS — S52611A Displaced fracture of right ulna styloid process, initial encounter for closed fracture: Secondary | ICD-10-CM | POA: Diagnosis not present

## 2017-10-28 DIAGNOSIS — S62101A Fracture of unspecified carpal bone, right wrist, initial encounter for closed fracture: Secondary | ICD-10-CM

## 2017-10-28 MED ORDER — HYDROCODONE-ACETAMINOPHEN 5-325 MG PO TABS
1.0000 | ORAL_TABLET | Freq: Once | ORAL | Status: AC
  Administered 2017-10-28: 1 via ORAL

## 2017-10-28 MED ORDER — HYDROCODONE-ACETAMINOPHEN 5-325 MG PO TABS
ORAL_TABLET | ORAL | Status: AC
  Filled 2017-10-28: qty 1

## 2017-10-28 MED ORDER — HYDROCODONE-ACETAMINOPHEN 5-325 MG PO TABS: 1.0000 | ORAL_TABLET | Freq: Four times a day (QID) | ORAL | 0 refills | Status: DC | PRN

## 2017-10-28 NOTE — ED Triage Notes (Signed)
Pt fell while trimming her brushes and landed on her right wrist. This happened this morning.

## 2017-10-28 NOTE — Progress Notes (Signed)
Orthopedic Tech Progress Note Patient Details:  [name] August 24, 1950 [id number]  Ortho Devices Type of Ortho Device: Ace wrap, Volar splint Ortho Device/Splint Interventions: Application   Post Interventions Patient Tolerated: Well Instructions Provided: Care of device   [place] Pink 10/28/2017, 1:35 PM

## 2017-10-28 NOTE — ED Provider Notes (Signed)
[place]    CSN: [id number] Arrival date & time: 10/28/17  1154     History   Chief Complaint No chief complaint on file.   HPI [name] is a 67 y.o. female.   HPI Had a fall at home.  She fell on her outstretched right hand.  She has pain, swelling, deformity.  She came directly here for medical care. Patient lost her balance and fell.  She is certain that she did not pass out or blackout.  She does not have any chest pain or shortness of breath. She is a heart patient.  She is compliant with her medications.  She is compliant with her medical office visits.  Past Medical History:  Diagnosis Date  . AICD (automatic cardioverter/defibrillator) present 01/2013   Biventricular cardiac pacemaker in situ   . Allergic rhinitis   . CAD (coronary artery disease)    a. 2004: s/p MI in [place]. No PCI->Medical RX;  b. 07/2012 Cath: LM 30-40, LAD 70p, 70/5m D1 80-90p, OM1 small 90p, OM2 large 80-90p, 561m70-80d, RCA 20-30 diff, EF 40%, glob HK.s/p CABG  . [facility]   Patient reports left breast cancer diagnosis in 2002 treated with bilateral mastectomy positive lymph nodes with left axillary dissection followed by chemotherapy of unknown type  . Cataract   . Diabetes mellitus type II   . Exertional shortness of breath   . Hyperlipidemia   . Hypertension   . Ischemic cardiomyopathy    a. 07/2012 Echo: EF 35%, Sev inferoseptal HK, mildly dil LA, Peak PASP 5962m.  . LMarland KitchenBB (left bundle branch block)    a. intermittent - present during rapid afib 07/2012.  . NMarland Kitchenuromuscular disorder ([facility]CC[facility]  Patient reports chronic numbness in the right foot related to previous surgery on the right leg and "nerve damage"  . PAF (paroxysmal atrial fibrillation) (HCCBrule  a. 07/2012: Amio and xarelto initiated.  [facility]   Patient Active Problem List   Diagnosis Date Noted  . Vitamin D deficiency 02/22/2016  . Long term current use  of anticoagulant 11/10/2015  . Type 2 diabetes mellitus with circulatory disorder, without long-term current use of insulin (HCC[place]7/25/2017  . Chronic pain disorder 10/03/2015  . Generalized osteoarthrosis, involving multiple sites 08/25/2015  . Other fatigue 08/11/2015  . Back pain, chronic 08/11/2015  . Incisional hernia 05/25/2015  . Incisional hernia, without obstruction or gangrene 05/04/2015  . Exertional dyspnea 08/05/2014  . Routine general medical examination at a health care facility 04/14/2014  . Anxiety state, unspecified 05/13/2013  . Acute on chronic combined systolic and diastolic CHF, NYHA class 3 (HCCKawela Bay1/11/2013  . High anion gap metabolic acidosis 01/94/85/4627 Biventricular cardiac pacemaker - [employer], Nov 2014 03/04/2013  . Chronic combined systolic and diastolic CHF, NYHA class 2 (HCCSabin0/31/2014  . S/P CABG x 4: 09/28/12 (LIMA-LAD, SVG-OM1-OM2, SVG-Intermediate) 09/29/2012  . Right carotid bruit 08/11/2012  . PAF (paroxysmal atrial fibrillation) (HCC[place]5/13/2014  . Ischemic cardiomyopathy   . CAD (coronary artery disease)   . Anogenital warts 01/22/2011  . Hyperlipidemia 09/19/2009  . Essential hypertension 09/19/2009  . MYOCARDIAL INFARCTION, HX OF 09/19/2009  . Allergic rhinitis 09/19/2009  . BREAST CANCER, HX OF 09/19/2009    Past Surgical History:  Procedure Laterality Date  . ABDOMINAL HYSTERECTOMY  2000  . APPENDECTOMY  1974  . BI-VENTRICULAR PACEMAKER INSERTION N/A 01/25/2013   Procedure: BI-VENTRICULAR PACEMAKER  INSERTION (CRT-P);  Surgeon: Evans Lance, MD;  Location: [facility] CATH LAB;  Service: Cardiovascular;  Laterality: N/A;  . BREAST BIOPSY Left 2002  . CARDIAC CATHETERIZATION    . CHOLECYSTECTOMY OPEN  1974  . CORONARY ARTERY BYPASS GRAFT N/A 09/28/2012   Procedure: CORONARY ARTERY BYPASS GRAFTING (CABG);  Surgeon: [name], MD;  Location: [place];  Service: Open Heart Surgery;  Laterality: N/A;  CABG x four, using left internal mammary  artery and left leg greater saphenous vein harvested endoscopically  . DILATION AND CURETTAGE OF UTERUS  1983  . EPICARDIAL PACING LEAD PLACEMENT N/A 09/28/2012   Procedure: EPICARDIAL PACING LEAD PLACEMENT;  Surgeon: [name], MD;  Location: [place];  Service: Thoracic;  Laterality: N/A;  LV LEAD PLACEMENT  . HERNIA REPAIR    . INCISIONAL HERNIA REPAIR N/A 05/25/2015   Procedure: LAPAROSCOPIC INCISIONAL HERNIA WITH MESH ;  Surgeon: [name], MD;  Location: [place];  Service: General;  Laterality: N/A;  . INCONTINENCE SURGERY  2000  . INSERTION OF MESH N/A 05/25/2015   Procedure: INSERTION OF MESH;  Surgeon: [name], MD;  Location: [place];  Service: General;  Laterality: N/A;  . INTRAOPERATIVE TRANSESOPHAGEAL ECHOCARDIOGRAM N/A 09/28/2012   Procedure: INTRAOPERATIVE TRANSESOPHAGEAL ECHOCARDIOGRAM;  Surgeon: [name], MD;  Location: [place];  Service: Open Heart Surgery;  Laterality: N/A;  . LAPAROSCOPIC INCISIONAL / UMBILICAL / VENTRAL HERNIA REPAIR  05/25/2015   IHR  . LEFT HEART CATHETERIZATION WITH CORONARY ANGIOGRAM N/A 07/20/2012   Procedure: LEFT HEART CATHETERIZATION WITH CORONARY ANGIOGRAM;  Surgeon: [name], MD;  Location: [facility] CATH LAB;  Service: Cardiovascular;  Laterality: N/A;  . MASTECTOMY Right 2002  . MASTECTOMY MODIFIED RADICAL W/ AXILLARY LYMPH NODES W/ OR W/O PECTORALIS MINOR Left 2002  . MAZE N/A 09/28/2012   Procedure: MAZE;  Surgeon: [name], MD;  Location: [place];  Service: Open Heart Surgery;  Laterality: N/A;  . TENDON REPAIR Right 2001 X 3-4   torn ligaments and tendons in ankle up to knee from work related accident  . TUBAL LIGATION  1987    OB History   None      Home Medications    Prior to Admission medications   Medication Sig Start Date End Date Taking? Authorizing Provider  aspirin 81 MG tablet Take 1 tablet (81 mg total) by mouth daily. 07/21/12   Theora Gianotti, NP  BAYER MICROLET LANCETS lancets Use  as instructed 03/12/16   Philemon Kingdom, MD  Blood Glucose Monitoring Suppl (BAYER CONTOUR NEXT LINK) w/Device KIT Use to check blood sugar 2 times per day. 08/11/15   Renato Shin, MD  canagliflozin (INVOKANA) 100 MG TABS tablet Take 1 tablet (100 mg total) by mouth every morning. 04/29/17   Philemon Kingdom, MD  carvedilol (COREG) 25 MG tablet TAKE 0.5 TABLETS (12.5 MG TOTAL) BY MOUTH 2 (TWO) TIMES DAILY.   -- NEW LOWER DOSE!!! 04/21/17   Croitoru, Dani Gobble, MD  Dulaglutide (TRULICITY) 1.5 HU/7.6LY SOPN INJECT 1.5 MG INTO THE SKIN ONCE A WEEK.   NEED FOLLOW UP FOR FUTURE REFILLS 04/29/17   Philemon Kingdom, MD  Evolocumab (REPATHA SURECLICK) 650 MG/ML SOAJ Inject 140 mg into the skin every 14 (fourteen) days. 04/03/17   Croitoru, Mihai, MD  furosemide (LASIX) 40 MG tablet Take 1 tablet (40 mg total) by mouth daily. 07/01/17   Dorena Cookey, MD  glipiZIDE (GLUCOTROL) 5 MG tablet TAKE ONE TABLET BY MOUTH TWICE A DAY BEFORE  A MEAL 07/01/17   Dorena Cookey, MD  glucose blood (CONTOUR NEXT TEST) test strip Use as instructed to check sugar 2 times daily 01/08/17   Philemon Kingdom, MD  HYDROcodone-acetaminophen (NORCO/VICODIN) 5-325 MG tablet Take 1-2 tablets by mouth every 6 (six) hours as needed for severe pain. 10/28/17   Raylene Everts, MD  losartan (COZAAR) 25 MG tablet Take 1 tablet (25 mg total) by mouth 2 (two) times daily. 04/21/17   Croitoru, Mihai, MD  metFORMIN (GLUCOPHAGE) 1000 MG tablet TAKE ONE TABLET BY MOUTH TWICE A DAY WITH FOOD 07/01/17   Dorena Cookey, MD  nitroGLYCERIN (NITROSTAT) 0.4 MG SL tablet Place 1 tablet (0.4 mg total) under the tongue every 5 (five) minutes as needed for chest pain. 04/10/15   Dorena Cookey, MD  rivaroxaban (XARELTO) 20 MG TABS tablet Take 1 tablet (20 mg total) by mouth daily with supper. 04/21/17   Croitoru, Mihai, MD  TRULICITY 1.5 XH/3.7JI SOPN INJECT 1.5 MG (0.5ML) INTO THE SKIN AS DIRECTED ONCE WEEKLY 10/13/17   Philemon Kingdom, MD    Family  History Family History  Problem Relation Age of Onset  . Kidney failure Mother   . Heart disease Mother        Died mid 96V complications of diabetes  . Colon cancer Sister   . Arthritis Other   . Cancer Other        ovarian  . Diabetes Other   . Hyperlipidemia Other   . Liver cancer Brother   . Stomach cancer Neg Hx   . Rectal cancer Neg Hx   . Esophageal cancer Neg Hx     Social History Social History   Tobacco Use  . Smoking status: Former Smoker    Packs/day: 1.00    Years: 10.00    Pack years: 10.00    Types: Cigarettes    Last attempt to quit: 03/11/2012    Years since quitting: 5.6  . Smokeless tobacco: Never Used  Substance Use Topics  . Alcohol use: No  . Drug use: No     Allergies   Statins   Review of Systems Review of Systems  Constitutional: Negative for chills and fever.  HENT: Negative for ear pain and sore throat.   Eyes: Negative for pain and visual disturbance.  Respiratory: Negative for cough and shortness of breath.   Cardiovascular: Negative for chest pain and palpitations.  Gastrointestinal: Negative for abdominal pain and vomiting.  Genitourinary: Negative for dysuria and hematuria.  Musculoskeletal: Positive for arthralgias. Negative for back pain.  Skin: Negative for color change and rash.  Neurological: Negative for seizures and syncope.  All other systems reviewed and are negative.    Physical Exam Triage Vital Signs ED Triage Vitals  Enc Vitals Group     BP 10/28/17 1209 120/68     Pulse Rate 10/28/17 1209 85     Resp 10/28/17 1209 16     Temp 10/28/17 1209 98 F (36.7 C)     Temp src --      SpO2 10/28/17 1209 100 %     Weight 10/28/17 1210 151 lb (68.5 kg)     Height --      Head Circumference --      Peak Flow --      Pain Score 10/28/17 1210 10     Pain Loc --      Pain Edu? --      Excl. in GC? --    No  data found.  Updated Vital Signs BP 120/68   Pulse 85   Temp 98 F (36.7 C)   Resp 16   Wt 68.5 kg    SpO2 100%   BMI 25.13 kg/m   Visual Acuity Right Eye Distance:   Left Eye Distance:   Bilateral Distance:    Right Eye Near:   Left Eye Near:    Bilateral Near:     Physical Exam  Constitutional: She appears well-developed and well-nourished. No distress.  HENT:  Head: Normocephalic and atraumatic.  Mouth/Throat: Oropharynx is clear and moist.  Eyes: Pupils are equal, round, and reactive to light. Conjunctivae are normal.  Neck: Normal range of motion.  Cardiovascular: Normal rate and regular rhythm.  Well-healed sternotomy scar  Pulmonary/Chest: Effort normal and breath sounds normal. No respiratory distress.  Abdominal: Soft. She exhibits no distension.  Musculoskeletal: Normal range of motion. She exhibits no edema.  Right wrist, distal radius with swelling deformity and tenderness.  Normal sensation of fingers.  Normal cap refill of fingers.  Neurological: She is alert.  Skin: Skin is warm and dry.  Psychiatric: She has a normal mood and affect. Her behavior is normal.     UC Treatments / Results  Labs (all labs ordered are listed, but only abnormal results are displayed) Labs Reviewed - No data to display  EKG None  Radiology Dg Wrist Complete Right  Result Date: 10/28/2017 CLINICAL DATA:  [place] today in her yard injuring her RIGHT wrist, pain and swelling with deformity EXAM: RIGHT WRIST - COMPLETE 3+ VIEW COMPARISON:  None FINDINGS: Osseous demineralization. Joint spaces preserved. Displaced ulnar styloid fracture. Transverse metaphyseal fracture distal RIGHT radius with mild dorsal displacement. Mild apex volar angulation and dorsal tilt of the distal radial articular surface. No definite intra-articular extension. No additional fracture, dislocation, or bone destruction. IMPRESSION: Displaced RIGHT ulnar styloid fracture. Displaced and angulated transverse fracture of the distal RIGHT radial metaphysis. Electronically Signed   By: Lavonia Dana M.D.   On: 10/28/2017  12:46    Procedures Procedures (including critical care time)  Medications Ordered in UC Medications  HYDROcodone-acetaminophen (NORCO/VICODIN) 5-325 MG per tablet 1 tablet (1 tablet Oral Given 10/28/17 1308)    Initial Impression / Assessment and Plan / UC Course  I have reviewed the triage vital signs and the nursing notes.  Pertinent labs & imaging results that were available during my care of the patient were reviewed by me and considered in my medical decision making (see chart for details).    Discussed narcotic pain medication.  Discussed appropriate use, caution drowsiness, caution constipation.  Discussed follow-up with orthopedist. Final Clinical Impressions(s) / UC Diagnoses   Final diagnoses:  Closed fracture of right wrist, initial encounter     Discharge Instructions     Elevate to reduce swelling Use ice for 20 minutes every couple of hours Leave splint in place until you see orthopedic in follow-up Take pain medication as needed for severe pain.  Caution, this can cause drowsiness, do not drive.  This also can cause constipation.    ED Prescriptions    Medication Sig Dispense Auth. Provider   HYDROcodone-acetaminophen (NORCO/VICODIN) 5-325 MG tablet Take 1-2 tablets by mouth every 6 (six) hours as needed for severe pain. 15 tablet Raylene Everts, MD     Controlled Substance Prescriptions [place] Controlled Substance Registry consulted? Not Applicable   Raylene Everts, MD 10/28/17 1321

## 2017-10-28 NOTE — Discharge Instructions (Signed)
Elevate to reduce swelling Use ice for 20 minutes every couple of hours Leave splint in place until you see orthopedic in follow-up Take pain medication as needed for severe pain.  Caution, this can cause drowsiness, do not drive.  This also can cause constipation.

## 2017-11-04 DIAGNOSIS — S62101A Fracture of unspecified carpal bone, right wrist, initial encounter for closed fracture: Secondary | ICD-10-CM | POA: Diagnosis not present

## 2017-11-05 DIAGNOSIS — M25531 Pain in right wrist: Secondary | ICD-10-CM | POA: Diagnosis not present

## 2017-11-05 DIAGNOSIS — S52501A Unspecified fracture of the lower end of right radius, initial encounter for closed fracture: Secondary | ICD-10-CM | POA: Diagnosis not present

## 2017-11-06 ENCOUNTER — Telehealth: Payer: Self-pay | Admitting: Cardiovascular Disease

## 2017-11-06 NOTE — Telephone Encounter (Signed)
   Primary Cardiologist: Dr. Sallyanne Kuster, 11/27/2016  Chart reviewed as part of pre-operative protocol coverage. Because of [name]'s past medical history and time since last visit, he/she will require a follow-up visit in order to better assess preoperative cardiovascular risk.  Pre-op covering staff: - Please schedule appointment and call patient to inform them. - Please contact requesting surgeon's office via preferred method (i.e, phone, fax) to inform them of need for appointment prior to surgery.  [name], PA-C  11/06/2017, 4:42 PM    5.  [facility] Dr. Amedeo Plenty   6. What is your office phone number  [phone]   7.   What is your office fax number [phone]

## 2017-11-06 NOTE — Telephone Encounter (Signed)
New Message       [facility] Pre-operative Risk Assessment    Request for surgical clearance:  1. What type of surgery is being performed? Wrist fracture  2. When is this surgery scheduled? 11/08/2017  3. What type of clearance is required (medical clearance vs. Pharmacy clearance to hold med vs. Both)? Both  4. Are there any medications that need to be held prior to surgery and how long? Xarelto hold for 5 days  5. Practice name and name of physician performing surgery? [facility] Dr. Amedeo Plenty   6. What is your office phone number  [phone]   7.   What is your office fax number [phone]  8.   Anesthesia type (None, local, MAC, general) ? Choice    [name] 11/06/2017, 8:32 AM  _________________________________________________________________   (provider comments below)

## 2017-11-06 NOTE — Telephone Encounter (Signed)
Follow Up    Pt is checking on the status of her surgical clearance. She is scheduled for surgery on 11-08-17.  :

## 2017-11-07 ENCOUNTER — Encounter: Payer: Self-pay | Admitting: Cardiovascular Disease

## 2017-11-07 ENCOUNTER — Other Ambulatory Visit: Payer: Self-pay

## 2017-11-07 ENCOUNTER — Encounter (HOSPITAL_COMMUNITY): Payer: Self-pay | Admitting: *Deleted

## 2017-11-07 ENCOUNTER — Ambulatory Visit: Payer: BLUE CROSS/BLUE SHIELD | Admitting: Cardiovascular Disease

## 2017-11-07 VITALS — BP 102/70 | HR 90 | Ht 65.0 in | Wt 152.2 lb

## 2017-11-07 DIAGNOSIS — I10 Essential (primary) hypertension: Secondary | ICD-10-CM | POA: Diagnosis not present

## 2017-11-07 DIAGNOSIS — I480 Paroxysmal atrial fibrillation: Secondary | ICD-10-CM | POA: Diagnosis not present

## 2017-11-07 DIAGNOSIS — I25810 Atherosclerosis of coronary artery bypass graft(s) without angina pectoris: Secondary | ICD-10-CM

## 2017-11-07 DIAGNOSIS — E1159 Type 2 diabetes mellitus with other circulatory complications: Secondary | ICD-10-CM | POA: Diagnosis not present

## 2017-11-07 DIAGNOSIS — I255 Ischemic cardiomyopathy: Secondary | ICD-10-CM

## 2017-11-07 DIAGNOSIS — E782 Mixed hyperlipidemia: Secondary | ICD-10-CM

## 2017-11-07 DIAGNOSIS — Z01810 Encounter for preprocedural cardiovascular examination: Secondary | ICD-10-CM

## 2017-11-07 DIAGNOSIS — I5042 Chronic combined systolic (congestive) and diastolic (congestive) heart failure: Secondary | ICD-10-CM | POA: Diagnosis not present

## 2017-11-07 DIAGNOSIS — Z7901 Long term (current) use of anticoagulants: Secondary | ICD-10-CM

## 2017-11-07 DIAGNOSIS — Z950 Presence of cardiac pacemaker: Secondary | ICD-10-CM

## 2017-11-07 MED ORDER — FUROSEMIDE 20 MG PO TABS: 20.0000 mg | ORAL_TABLET | Freq: Every day | ORAL | 3 refills | Status: DC

## 2017-11-07 NOTE — Progress Notes (Signed)
Cardiology Office Note:    Date:  11/09/2017   ID:  [name], DOB 30-Apr-1950, MRN [id number]  PCP:  Michelle Cookey, MD  Cardiologist:  Michelle Klein, MD    Referring MD: Michelle Cookey, MD   Chief Complaint  Patient presents with  . Medical Clearance    pt fell while cut her grass  Follow-up coronary artery disease, CRT-P  History of Present Illness:    [name] is a 67 y.o. female with a hx of CAD s/p CABG 2014, paroxysmal atrial fibrillation, CHF with recovery of EF after CRT-P (St. Jude), type 2 diabetes mellitus, hypercholesterolemia, hypertension returning for follow-up.  She is done very well from a cardiac standpoint without angina or dyspnea.  However she fell a few days ago and has a complicated right wrist fracture.  She is scheduled to have surgery tomorrow and has been holding her anticoagulant.  It was a mechanical fall, not preceded by dizziness or syncope.  The patient specifically denies any chest pain at rest exertion, dyspnea at rest or with exertion, orthopnea, paroxysmal nocturnal dyspnea, syncope, palpitations, focal neurological deficits, intermittent claudication, lower extremity edema, unexplained weight gain, cough, hemoptysis or wheezing.  Diabetes control has deteriorated and her last hemoglobin A1c was 7.9%, compared with 6.9% a year ago.  She is on a PCSK9 inhibitor for statin intolerant hypercholesterolemia.  Her most recent LDL cholesterol is extremely low at 25.  Her HDL was excellent at 61.  Device interrogation shows normal function, anticipated generator longevity of 5-6 years, normal lead parameters, no evidence of interim ventricular tachycardia or atrial fibrillation.  Similar to her last appointment, there is less than 1 % atrial pacing and >99% biventricular pacing.  She has not had atrial fibrillation or sustained ventricular tachycardia.  She has had occasional episodes of PAT always brief (maximum 18 beats) and a single 6 beat episode  of nonsustained VT. her thoracic impedance recordings suggest that she is euvolemic or may be even a little bit "dry".  [name] presented with myocardial infarction in 2004. In May 2014 presented with congestive heart failure and atrial fibrillation in the setting of three-vessel coronary artery disease leading to bypass surgery (July 2014, Dr. Servando Snare, LIMA to LAD, SVG to ramus intermedius, sequential SVG to OM1 and OM 2, surgical maze procedure). She had persistent depressed left ventricular systolic function and received a dual-chamber biventricular pacemaker (St. Jude Allure, Dr. Lovena Le in November 2014).   Last echo February 2016 shows recovery of left ventricular ejection fraction 50-55%. In January 2015, she was admitted with acute on chronic heart failure due to excessive intravenous fluid administration during an episode of small bowel obstruction.. She has type 2 diabetes mellitus that has been difficult to control as well as hypertension and hyperlipidemia.  Past Medical History:  Diagnosis Date  . AICD (automatic cardioverter/defibrillator) present 01/2013   Biventricular cardiac pacemaker in situ   . Allergic rhinitis   . CAD (coronary artery disease)    a. 2004: s/p MI in [place]. No PCI->Medical RX;  b. 07/2012 Cath: LM 30-40, LAD 70p, 70/7m D1 80-90p, OM1 small 90p, OM2 large 80-90p, 541m70-80d, RCA 20-30 diff, EF 40%, glob HK.s/p CABG  . [facility]   Patient reports left breast cancer diagnosis in 2002 treated with bilateral mastectomy positive lymph nodes with left axillary dissection followed by chemotherapy of unknown type  . Cataract   . Diabetes mellitus type II   . Exertional shortness of  breath   . Hyperlipidemia   . Hypertension   . Ischemic cardiomyopathy    a. 07/2012 Echo: EF 35%, Sev inferoseptal HK, mildly dil LA, Peak PASP 62mHg.  .Marland KitchenLBBB (left bundle branch block)    a. intermittent - present during rapid afib 07/2012.  .Marland KitchenNeuromuscular  disorder ([facility]    Patient reports chronic numbness in the right foot related to previous surgery on the right leg and "nerve damage"  . PAF (paroxysmal atrial fibrillation) ([facility]    a. 07/2012: Amio and xarelto initiated.  . S[place] ([place] bowel obstruction) ([place]     Past Surgical History:  Procedure Laterality Date  . ABDOMINAL HYSTERECTOMY  2000  . APPENDECTOMY  1974  . BI-VENTRICULAR PACEMAKER INSERTION N/A 01/25/2013   Procedure: BI-VENTRICULAR PACEMAKER INSERTION (CRT-P);  Surgeon: GEvans Lance MD;  [facility];  Service: Cardiovascular;  Laterality: N/A;  . BREAST BIOPSY Left 2002  . CARDIAC CATHETERIZATION    . CHOLECYSTECTOMY OPEN  1974  . CORONARY ARTERY BYPASS GRAFT N/A 09/28/2012   Procedure: CORONARY ARTERY BYPASS GRAFTING (CABG);  Surgeon: EGrace Isaac MD;  [place]  Service: Open Heart Surgery;  Laterality: N/A;  CABG x four, using left internal mammary artery and left leg greater saphenous vein harvested endoscopically  . DILATION AND CURETTAGE OF UTERUS  1983  . EPICARDIAL PACING LEAD PLACEMENT N/A 09/28/2012   Procedure: EPICARDIAL PACING LEAD PLACEMENT;  Surgeon: EGrace Isaac MD;  [place]  Service: Thoracic;  Laterality: N/A;  LV LEAD PLACEMENT  . HERNIA REPAIR    . INCISIONAL HERNIA REPAIR N/A 05/25/2015   Procedure: LAPAROSCOPIC INCISIONAL HERNIA WITH MESH ;  Surgeon: MRolm Bookbinder MD;  [place]  Service: General;  Laterality: N/A;  . INCONTINENCE SURGERY  2000  . INSERTION OF MESH N/A 05/25/2015   Procedure: INSERTION OF MESH;  Surgeon: MRolm Bookbinder MD;  [place]  Service: General;  Laterality: N/A;  . INTRAOPERATIVE TRANSESOPHAGEAL ECHOCARDIOGRAM N/A 09/28/2012   Procedure: INTRAOPERATIVE TRANSESOPHAGEAL ECHOCARDIOGRAM;  Surgeon: EGrace Isaac MD;  [place]  Service: Open Heart Surgery;  Laterality: N/A;  . LAPAROSCOPIC INCISIONAL / UMBILICAL / VENTRAL HERNIA REPAIR  05/25/2015   I[facility]  . LEFT  HEART CATHETERIZATION WITH CORONARY ANGIOGRAM N/A 07/20/2012   Procedure: LEFT HEART CATHETERIZATION WITH CORONARY ANGIOGRAM;  Surgeon: Peter M JMartinique MD;  [facility];  Service: Cardiovascular;  Laterality: N/A;  . MASTECTOMY Right 2002  . MASTECTOMY MODIFIED RADICAL W/ AXILLARY LYMPH NODES W/ OR W/O PECTORALIS MINOR Left 2002  . MAZE N/A 09/28/2012   Procedure: MAZE;  Surgeon: EGrace Isaac MD;  [place]  Service: Open Heart Surgery;  Laterality: N/A;  . TENDON REPAIR Right 2001 X 3-4   torn ligaments and tendons in ankle up to knee from work related accident  . TUBAL LIGATION  1987    Current Medications: Current Meds  Medication Sig  . aspirin 81 MG tablet Take 1 tablet (81 mg total) by mouth daily.  .Marland KitchenBAYER MICROLET LANCETS lancets Use as instructed  . Blood Glucose Monitoring Suppl (BAYER CONTOUR NEXT LINK) w/Device KIT Use to check blood sugar 2 times per day.  . canagliflozin (INVOKANA) 100 MG TABS tablet Take 1 tablet (100 mg total) by mouth every morning.  . carvedilol (COREG) 25 MG tablet TAKE 0.5 TABLETS (12.5 MG TOTAL) BY MOUTH 2 (TWO) TIMES DAILY.   -- NEW LOWER DOSE!!! (Patient taking differently: Take 25  mg by mouth 2 (two) times daily with a meal. )  . Dulaglutide (TRULICITY) 1.5 WL/8.9HT SOPN INJECT 1.5 MG INTO THE SKIN ONCE A WEEK.   NEED FOLLOW UP FOR FUTURE REFILLS  . Evolocumab (REPATHA SURECLICK) 342 MG/ML SOAJ Inject 140 mg into the skin every 14 (fourteen) days.  . furosemide (LASIX) 20 MG tablet Take 1 tablet (20 mg total) by mouth daily.  Marland Kitchen glipiZIDE (GLUCOTROL) 5 MG tablet TAKE ONE TABLET BY MOUTH TWICE A DAY BEFORE A MEAL (Patient taking differently: Take 5 mg by mouth 2 (two) times daily before a meal. TAKE ONE TABLET BY MOUTH TWICE A DAY BEFORE A MEAL)  . glucose blood (CONTOUR NEXT TEST) test strip Use as instructed to check sugar 2 times daily  . losartan (COZAAR) 25 MG tablet Take 1 tablet (25 mg total) by mouth 2 (two) times daily.  .  metFORMIN (GLUCOPHAGE) 1000 MG tablet TAKE ONE TABLET BY MOUTH TWICE A DAY WITH FOOD (Patient taking differently: Take 1,000 mg by mouth 2 (two) times daily with a meal. TAKE ONE TABLET BY MOUTH TWICE A DAY WITH FOOD)  . nitroGLYCERIN (NITROSTAT) 0.4 MG SL tablet Place 1 tablet (0.4 mg total) under the tongue every 5 (five) minutes as needed for chest pain.  . rivaroxaban (XARELTO) 20 MG TABS tablet Take 1 tablet (20 mg total) by mouth daily with supper.  . [DISCONTINUED] furosemide (LASIX) 40 MG tablet Take 1 tablet (40 mg total) by mouth daily.     Allergies:   Statins   Social History   Socioeconomic History  . Marital status: Married    Spouse name: Not on file  . Number of children: 3  . Years of education: Not on file  . Highest education level: Not on file  Occupational History  . Occupation: Marketing executive work    Fish farm manager: [employer]. [employer].  Social Needs  . Financial resource strain: Not on file  . Food insecurity:    Worry: Not on file    Inability: Not on file  . Transportation needs:    Medical: Not on file    Non-medical: Not on file  Tobacco Use  . Smoking status: Former Smoker    Packs/day: 1.00    Years: 10.00    Pack years: 10.00    Types: Cigarettes    Last attempt to quit: 03/11/2012    Years since quitting: 5.6  . Smokeless tobacco: Never Used  Substance and Sexual Activity  . Alcohol use: No  . Drug use: No  . Sexual activity: Yes    Partners: Female    Birth control/protection: Surgical  Lifestyle  . Physical activity:    Days per week: Not on file    Minutes per session: Not on file  . Stress: Not on file  Relationships  . Social connections:    Talks on phone: Not on file    Gets together: Not on file    Attends religious service: Not on file    Active member of club or organization: Not on file    Attends meetings of clubs or organizations: Not on file    Relationship status: Not on file  Other Topics Concern  . Not on file  Social History  Narrative   Lives with husband and mother in law.     Regular exercise: walking   Caffeine use: 2 cups of coffee in the morning     Family History: The patient's family history includes Arthritis in her other;  Cancer in her other; Colon cancer in her sister; Diabetes in her other; Heart disease in her mother; Hyperlipidemia in her other; Kidney failure in her mother; Liver cancer in her brother. There is no history of Stomach cancer, Rectal cancer, or Esophageal cancer. ROS:   Please see the history of present illness.     All other systems reviewed and are negative.  EKGs/Labs/Other Studies Reviewed:    EKG:  EKG is  ordered today.  The ekg ordered today demonstrates Sinus rhythm with short AV delay and 100% biventricular pacing. The QRS complex is quite narrow at 116 ms with prominent R waves in leads V1 and V2. EC 507 ms  Recent Labs: 07/01/2017: Hemoglobin 13.6; Platelets 247.0; TSH 1.50 11/08/2017: BUN 16; Creatinine, Ser 0.72; Potassium 3.7; Sodium 137  Recent Lipid Panel    Component Value Date/Time   CHOL 116 06/26/2017 0903   TRIG 151 (H) 06/26/2017 0903   HDL 61 06/26/2017 0903   CHOLHDL 1.9 06/26/2017 0903   CHOLHDL 5.2 (H) 04/04/2016 0723   VLDL NOT CALC 04/04/2016 0723   LDLCALC 25 06/26/2017 0903   LDLDIRECT 50 04/04/2016 0723    Physical Exam:    VS:  BP 102/70   Pulse 90   Ht _0  (1.651 m)   Wt 152 lb 3.2 oz (69 kg)   SpO2 97%   BMI 25.33 kg/m     Wt Readings from Last 3 Encounters:  11/08/17 152 lb (68.9 kg)  11/07/17 152 lb 3.2 oz (69 kg)  10/28/17 151 lb (68.5 kg)     GEN:  Well nourished, well developed in no acute distress HEENT: Normal NECK: No JVD; No carotid bruits LYMPHATICS: No lymphadenopathy CARDIAC: RRR, Rather loud S2, no murmurs, rubs, gallops, excellent pedal and radial pulses, healthy left subclavian pacemaker site RESPIRATORY:  Clear to auscultation without rales, wheezing or rhonchi  ABDOMEN: Soft, non-tender,  non-distended MUSCULOSKELETAL:  No edema; No deformity  SKIN: Warm and dry NEUROLOGIC:  Alert and oriented x 3 PSYCHIATRIC:  Normal affect   ASSESSMENT:    1. Chronic combined systolic and diastolic CHF, NYHA class 2 ([place])   2. Coronary artery disease involving coronary bypass graft of native heart without angina pectoris   3. Type 2 diabetes mellitus with other circulatory complication, without long-term current use of insulin (HCC)   4. Paroxysmal atrial fibrillation ([place])   5. Long term current use of anticoagulant   6. Mixed hyperlipidemia   7. Essential hypertension   8. Biventricular cardiac pacemaker in situ   9. Preoperative cardiovascular examination    PLAN:    In order of problems listed above:  1. CHF: Prior to CRT-P LVEF was 35-40% and she had a lot of problems with heart failure exacerbation, following resynchronization therapy EF was 50-55% and she has not had problems with heart failure in a very long time. She is on an angiotensin receptor blocker and carvedilol.  I think she may be getting a little dry since she is also taking Invokana.  We will cut back on her furosemide to only 20 mg daily. 2. CAD s/p CABG: She is very active and does not have angina pectoris. 3. DM: I think she is on an excellent choice of agents for glycemic control, but recently her hemoglobin A1c has worsened.  Follow-up with her endocrinologist.  Despite the worsening glycemic control, her last triglyceride level was excellent at 151. 4. AFib:  She did have a MAZE procedure at the time of  bypass. AF has not been recorded in a very long time by her pacemaker. CHADSVasc 5 (age, gender, hypertension, heart fa not really, looks more like severe volume overload ilure, CAD). 5. Xarelto: Well-tolerated without bleeding problems.  Holding Xarelto for wrist surgery tomorrow. 6. HLP: Lipid profile dominated by hypertriglyceridemia. She was statin intolerant secondary to myalgia, despite trying numerous  agents.  Excellent response to Repatha. 7. HTN: Excellent blood pressure control, may be her blood pressure is actually a little bit too low.  Decrease the dose of furosemide.  She may be a little "dry". 8. CRT-P: Normal biventricular pacemaker function 9. Preop CV eval: Low risk for major complications with the planned wrist surgery; restart Xarelto once safe from a surgical standpoint  . Medication Adjustments/Labs and Tests Ordered: Current medicines are reviewed at length with the patient today.  Concerns regarding medicines are outlined above.  Orders Placed This Encounter  Procedures  . EKG 12-Lead   Meds ordered this encounter  Medications  . furosemide (LASIX) 20 MG tablet    Sig: Take 1 tablet (20 mg total) by mouth daily.    Dispense:  90 tablet    Refill:  3    Signed, Michelle Klein, MD  11/09/2017 4:57 PM    [place]

## 2017-11-07 NOTE — Telephone Encounter (Signed)
Follow Up  Pt returning call for nurse about surgical clearance

## 2017-11-07 NOTE — Telephone Encounter (Signed)
I s/w pt who has been scheduled for an appt today per Pre Op Protocol. Pt seeing Dr. Sallyanne Kuster her primary cardiologist. I will route surgery clearance information over to Dr. Sallyanne Kuster. Pt was very thankful for the appt today.

## 2017-11-07 NOTE — Telephone Encounter (Signed)
Patient with diagnosis of Afib on Xarelto for anticoagulation.    Procedure: Wrist fracture Date of procedure: 11/08/17  CHADS2-VASc score of  6 (CHF, HTN, AGE, DM2, stroke/tia x 2, CAD, AGE, female)  CrCl 3ml/min  Per office protocol, patient can hold Xarelto for 24 hours prior to procedure.

## 2017-11-07 NOTE — Patient Instructions (Addendum)
Dr Sallyanne Kuster has recommended making the following medication changes: 1. DECREASE Furosemide to 20 mg daily  Remote monitoring is used to monitor your Pacemaker or ICD from home. This monitoring reduces the number of office visits required to check your device to one time per year. It allows [place] to keep an eye on the functioning of your device to ensure it is working properly. You are scheduled for a device check from home on Tuesday, September 3rd, 2019. You may send your transmission at any time that day. If you have a wireless device, the transmission will be sent automatically. After your physician reviews your transmission, you will receive a notification with your next transmission date.  To improve our patient care and to more adequately follow your device, [facility] has decided, as a practice, to start following each patient four times a year with your home monitor. This means that you may experience a remote appointment that is close to an in-office appointment with your physician. Your insurance will apply at the same rate as other remote monitoring transmissions.  Dr Sallyanne Kuster recommends that you schedule a follow-up appointment in 12 months with a pacemaker check. You will receive a reminder letter in the mail two months in advance. If you don't receive a letter, please call our office to schedule the follow-up appointment.  If you need a refill on your cardiac medications before your next appointment, please call your pharmacy.

## 2017-11-07 NOTE — Progress Notes (Signed)
Spoke with pt for pre-op call. Pt has hx of CAD and CABG in 2014. She also has a pacemaker/ICD. Dr. Sallyanne Kuster is her cardiologist. Pt's last dose of Xarelto and Aspirin ws 11/05/17 (PM) She denies any recent chest pain or sob. Pt is a type 2 diabetic. Last A1C was 6.8 four months ago. She states her fasting blood sugar is usually around the 180's. Instructed pt to hold her evening dose of Glipizide today and not take any of her oral medications in the AM. Instructed pt to check her blood sugar when she gets up Saturday AM and every 2 hours until she leaves for the hospital. If blood sugar is 70 or below, treat with 1/2 cup of clear juice (apple or cranberry) and recheck blood sugar 15 minutes after drinking juice. Pt voiced understanding.

## 2017-11-07 NOTE — Telephone Encounter (Signed)
Letter sent to [facility] today. MCr

## 2017-11-07 NOTE — Telephone Encounter (Signed)
Per Pre Op  Protocol pt needs an appt. I see the pt is scheduled to see Dr. Sallyanne Kuster for 12/03/17. I have called and left message for [facility] pt will need an appt before being cleared. Pt is scheduled for surgery 11/08/17. I will fax this information to [facility] as well to fax # [phone] for Dr. Amedeo Plenty.    I have left a message for pt call back to see if we can get her appt moved up sooner.

## 2017-11-07 NOTE — Progress Notes (Signed)
Spoke with Windle Guard, Rep for [facility] about pt's Pacemaker/ICD. He states Jarrett Soho, the rep on call tomorrow will stop by in the AM and see if anything will need to be done.

## 2017-11-07 NOTE — Telephone Encounter (Signed)
Patient in need of appointment for surgical clearance. I will route to [facility] to get recommendations regarding Xarelto. Richardson Dopp, PA-C    11/07/2017 9:57 AM

## 2017-11-08 ENCOUNTER — Encounter (HOSPITAL_COMMUNITY): Payer: Self-pay | Admitting: *Deleted

## 2017-11-08 ENCOUNTER — Ambulatory Visit (HOSPITAL_COMMUNITY)
Admission: RE | Admit: 2017-11-08 | Discharge: 2017-11-08 | Disposition: A | Discharge status: Home / Self Care | Payer: BLUE CROSS/BLUE SHIELD | Source: Ambulatory Visit | Attending: Orthopedic Surgery | Admitting: Orthopedic Surgery

## 2017-11-08 ENCOUNTER — Ambulatory Visit (HOSPITAL_COMMUNITY): Payer: BLUE CROSS/BLUE SHIELD | Admitting: Anesthesiology

## 2017-11-08 ENCOUNTER — Encounter (HOSPITAL_COMMUNITY)
Admission: RE | Disposition: A | Discharge status: Home / Self Care | Payer: Self-pay | Source: Ambulatory Visit | Attending: Orthopedic Surgery

## 2017-11-08 DIAGNOSIS — Z7984 Long term (current) use of oral hypoglycemic drugs: Secondary | ICD-10-CM | POA: Insufficient documentation

## 2017-11-08 DIAGNOSIS — Z79899 Other long term (current) drug therapy: Secondary | ICD-10-CM | POA: Insufficient documentation

## 2017-11-08 DIAGNOSIS — I2510 Atherosclerotic heart disease of native coronary artery without angina pectoris: Secondary | ICD-10-CM | POA: Insufficient documentation

## 2017-11-08 DIAGNOSIS — I509 Heart failure, unspecified: Secondary | ICD-10-CM | POA: Insufficient documentation

## 2017-11-08 DIAGNOSIS — E119 Type 2 diabetes mellitus without complications: Secondary | ICD-10-CM | POA: Insufficient documentation

## 2017-11-08 DIAGNOSIS — I5043 Acute on chronic combined systolic (congestive) and diastolic (congestive) heart failure: Secondary | ICD-10-CM | POA: Diagnosis not present

## 2017-11-08 DIAGNOSIS — Z87891 Personal history of nicotine dependence: Secondary | ICD-10-CM | POA: Diagnosis not present

## 2017-11-08 DIAGNOSIS — I252 Old myocardial infarction: Secondary | ICD-10-CM | POA: Insufficient documentation

## 2017-11-08 DIAGNOSIS — Z9221 Personal history of antineoplastic chemotherapy: Secondary | ICD-10-CM | POA: Insufficient documentation

## 2017-11-08 DIAGNOSIS — I110 Hypertensive heart disease with heart failure: Secondary | ICD-10-CM | POA: Diagnosis not present

## 2017-11-08 DIAGNOSIS — Z7901 Long term (current) use of anticoagulants: Secondary | ICD-10-CM | POA: Insufficient documentation

## 2017-11-08 DIAGNOSIS — I480 Paroxysmal atrial fibrillation: Secondary | ICD-10-CM | POA: Insufficient documentation

## 2017-11-08 DIAGNOSIS — I255 Ischemic cardiomyopathy: Secondary | ICD-10-CM | POA: Diagnosis not present

## 2017-11-08 DIAGNOSIS — G8918 Other acute postprocedural pain: Secondary | ICD-10-CM | POA: Diagnosis not present

## 2017-11-08 DIAGNOSIS — Z951 Presence of aortocoronary bypass graft: Secondary | ICD-10-CM | POA: Insufficient documentation

## 2017-11-08 DIAGNOSIS — S52571A Other intraarticular fracture of lower end of right radius, initial encounter for closed fracture: Secondary | ICD-10-CM | POA: Insufficient documentation

## 2017-11-08 DIAGNOSIS — Z853 Personal history of malignant neoplasm of breast: Secondary | ICD-10-CM | POA: Diagnosis not present

## 2017-11-08 DIAGNOSIS — X58XXXA Exposure to other specified factors, initial encounter: Secondary | ICD-10-CM | POA: Insufficient documentation

## 2017-11-08 DIAGNOSIS — Z95810 Presence of automatic (implantable) cardiac defibrillator: Secondary | ICD-10-CM | POA: Insufficient documentation

## 2017-11-08 DIAGNOSIS — Z7982 Long term (current) use of aspirin: Secondary | ICD-10-CM | POA: Diagnosis not present

## 2017-11-08 HISTORY — PX: ORIF WRIST FRACTURE: SHX2133

## 2017-11-08 LAB — BASIC METABOLIC PANEL
ANION GAP: 11 (ref 5–15)
BUN: 16 mg/dL (ref 8–23)
CHLORIDE: 105 mmol/L (ref 98–111)
CO2: 21 mmol/L — AB (ref 22–32)
Calcium: 9.2 mg/dL (ref 8.9–10.3)
Creatinine, Ser: 0.72 mg/dL (ref 0.44–1.00)
GFR calc non Af Amer: >60 mL/min (ref >60)
GLUCOSE: 213 mg/dL — AB (ref 70–99)
Potassium: 3.7 mmol/L (ref 3.5–5.1)
Sodium: 137 mmol/L (ref 135–145)

## 2017-11-08 LAB — HEMOGLOBIN A1C
Hgb A1c MFr Bld: 7.9 % — ABNORMAL HIGH (ref 4.8–5.6)
Mean Plasma Glucose: 180.03 mg/dL

## 2017-11-08 LAB — GLUCOSE, CAPILLARY
Glucose-Capillary: 194 mg/dL — ABNORMAL HIGH (ref 70–99)
Glucose-Capillary: 148 mg/dL — ABNORMAL HIGH (ref 70–99)

## 2017-11-08 SURGERY — OPEN REDUCTION INTERNAL FIXATION (ORIF) WRIST FRACTURE
Anesthesia: General | Site: Wrist | Laterality: Right

## 2017-11-08 MED ORDER — LACTATED RINGERS IV SOLN
INTRAVENOUS | Status: DC
  Administered 2017-11-08: 08:00:00 via INTRAVENOUS

## 2017-11-08 MED ORDER — FENTANYL CITRATE (PF) 100 MCG/2ML IJ SOLN
25.0000 ug | INTRAMUSCULAR | Status: DC | PRN
  Administered 2017-11-08: 50 ug via INTRAVENOUS

## 2017-11-08 MED ORDER — LIDOCAINE HCL (CARDIAC) PF 100 MG/5ML IV SOSY
PREFILLED_SYRINGE | INTRAVENOUS | Status: DC | PRN
  Administered 2017-11-08: 100 mg via INTRAVENOUS

## 2017-11-08 MED ORDER — LIDOCAINE 2% (20 MG/ML) 5 ML SYRINGE
INTRAMUSCULAR | Status: AC
  Filled 2017-11-08: qty 5

## 2017-11-08 MED ORDER — OXYCODONE HCL 5 MG PO TABS
ORAL_TABLET | ORAL | Status: AC
  Filled 2017-11-08: qty 2

## 2017-11-08 MED ORDER — DEXAMETHASONE SODIUM PHOSPHATE 10 MG/ML IJ SOLN
INTRAMUSCULAR | Status: AC
  Filled 2017-11-08: qty 1

## 2017-11-08 MED ORDER — PROPOFOL 10 MG/ML IV BOLUS
INTRAVENOUS | Status: DC | PRN
  Administered 2017-11-08 (×2): 50 mg via INTRAVENOUS

## 2017-11-08 MED ORDER — MIDAZOLAM HCL 2 MG/2ML IJ SOLN
INTRAMUSCULAR | Status: AC
  Filled 2017-11-08: qty 2

## 2017-11-08 MED ORDER — SODIUM CHLORIDE 0.9 % IV SOLN
INTRAVENOUS | Status: DC | PRN
  Administered 2017-11-08: 25 ug/min via INTRAVENOUS

## 2017-11-08 MED ORDER — OXYCODONE HCL 5 MG PO TABS
10.0000 mg | ORAL_TABLET | Freq: Once | ORAL | Status: AC
  Administered 2017-11-08: 10 mg via ORAL

## 2017-11-08 MED ORDER — 0.9 % SODIUM CHLORIDE (POUR BTL) OPTIME
TOPICAL | Status: DC | PRN
  Administered 2017-11-08: 1000 mL

## 2017-11-08 MED ORDER — PROPOFOL 10 MG/ML IV BOLUS
INTRAVENOUS | Status: AC
  Filled 2017-11-08: qty 20

## 2017-11-08 MED ORDER — FENTANYL CITRATE (PF) 250 MCG/5ML IJ SOLN
INTRAMUSCULAR | Status: AC
  Filled 2017-11-08: qty 5

## 2017-11-08 MED ORDER — FENTANYL CITRATE (PF) 100 MCG/2ML IJ SOLN
INTRAMUSCULAR | Status: AC
  Filled 2017-11-08: qty 2

## 2017-11-08 MED ORDER — PHENYLEPHRINE 40 MCG/ML (10ML) SYRINGE FOR IV PUSH (FOR BLOOD PRESSURE SUPPORT)
PREFILLED_SYRINGE | INTRAVENOUS | Status: DC | PRN
  Administered 2017-11-08 (×5): 80 ug via INTRAVENOUS

## 2017-11-08 MED ORDER — ONDANSETRON HCL 4 MG/2ML IJ SOLN
INTRAMUSCULAR | Status: AC
  Filled 2017-11-08: qty 2

## 2017-11-08 MED ORDER — FENTANYL CITRATE (PF) 100 MCG/2ML IJ SOLN
INTRAMUSCULAR | Status: DC | PRN
  Administered 2017-11-08: 50 ug via INTRAVENOUS

## 2017-11-08 MED ORDER — DEXAMETHASONE SODIUM PHOSPHATE 10 MG/ML IJ SOLN
INTRAMUSCULAR | Status: DC | PRN
  Administered 2017-11-08: 10 mg via INTRAVENOUS

## 2017-11-08 MED ORDER — ONDANSETRON HCL 4 MG/2ML IJ SOLN: 4.0000 mg | Freq: Once | INTRAMUSCULAR | Status: DC | PRN

## 2017-11-08 MED ORDER — ROPIVACAINE HCL 5 MG/ML IJ SOLN
INTRAMUSCULAR | Status: DC | PRN
  Administered 2017-11-08: 20 mL via PERINEURAL

## 2017-11-08 MED ORDER — PHENYLEPHRINE 40 MCG/ML (10ML) SYRINGE FOR IV PUSH (FOR BLOOD PRESSURE SUPPORT)
PREFILLED_SYRINGE | INTRAVENOUS | Status: AC
  Filled 2017-11-08: qty 10

## 2017-11-08 MED ORDER — CEFAZOLIN SODIUM-DEXTROSE 2-4 GM/100ML-% IV SOLN
INTRAVENOUS | Status: AC
  Filled 2017-11-08: qty 100

## 2017-11-08 MED ORDER — ONDANSETRON HCL 4 MG/2ML IJ SOLN
INTRAMUSCULAR | Status: DC | PRN
  Administered 2017-11-08: 4 mg via INTRAVENOUS

## 2017-11-08 MED ORDER — MIDAZOLAM HCL 5 MG/5ML IJ SOLN
INTRAMUSCULAR | Status: DC | PRN
  Administered 2017-11-08 (×2): 1 mg via INTRAVENOUS

## 2017-11-08 SURGICAL SUPPLY — 47 items
BANDAGE ACE 3X5.8 VEL STRL LF (GAUZE/BANDAGES/DRESSINGS) ×3 IMPLANT
BANDAGE ACE 4X5 VEL STRL LF (GAUZE/BANDAGES/DRESSINGS) ×3 IMPLANT
BIT DRILL 2.2 SS TIBIAL (BIT) ×3 IMPLANT
BNDG ESMARK 4X9 LF (GAUZE/BANDAGES/DRESSINGS) ×3 IMPLANT
BNDG GAUZE ELAST 4 BULKY (GAUZE/BANDAGES/DRESSINGS) ×6 IMPLANT
CANISTER SUCT 3000ML PPV (MISCELLANEOUS) ×3 IMPLANT
CORDS BIPOLAR (ELECTRODE) ×3 IMPLANT
COVER SURGICAL LIGHT HANDLE (MISCELLANEOUS) ×3 IMPLANT
CUFF TOURNIQUET SINGLE 18IN (TOURNIQUET CUFF) ×3 IMPLANT
DRAPE OEC MINIVIEW 54X84 (DRAPES) ×3 IMPLANT
DRAPE SURG 17X23 STRL (DRAPES) ×3 IMPLANT
DRSG ADAPTIC 3X8 NADH LF (GAUZE/BANDAGES/DRESSINGS) ×3 IMPLANT
GAUZE SPONGE 4X4 12PLY STRL (GAUZE/BANDAGES/DRESSINGS) ×3 IMPLANT
GAUZE XEROFORM 5X9 LF (GAUZE/BANDAGES/DRESSINGS) ×3 IMPLANT
GLOVE SS BIOGEL STRL SZ 8 (GLOVE) ×1 IMPLANT
GLOVE SUPERSENSE BIOGEL SZ 8 (GLOVE) ×2
GOWN STRL REUS W/ TWL LRG LVL3 (GOWN DISPOSABLE) ×1 IMPLANT
GOWN STRL REUS W/ TWL XL LVL3 (GOWN DISPOSABLE) ×1 IMPLANT
GOWN STRL REUS W/TWL LRG LVL3 (GOWN DISPOSABLE) ×2
GOWN STRL REUS W/TWL XL LVL3 (GOWN DISPOSABLE) ×2
KIT BASIN OR (CUSTOM PROCEDURE TRAY) ×3 IMPLANT
KIT TURNOVER KIT B (KITS) ×3 IMPLANT
NS IRRIG 1000ML POUR BTL (IV SOLUTION) ×3 IMPLANT
PACK ORTHO EXTREMITY (CUSTOM PROCEDURE TRAY) ×3 IMPLANT
PAD ARMBOARD 7.5X6 YLW CONV (MISCELLANEOUS) ×6 IMPLANT
PAD CAST 3X4 CTTN HI CHSV (CAST SUPPLIES) ×1 IMPLANT
PAD CAST 4YDX4 CTTN HI CHSV (CAST SUPPLIES) ×1 IMPLANT
PADDING CAST COTTON 3X4 STRL (CAST SUPPLIES) ×2
PADDING CAST COTTON 4X4 STRL (CAST SUPPLIES) ×2
PEG LOCKING SMOOTH 2.2X18 (Peg) ×6 IMPLANT
PEG LOCKING SMOOTH 2.2X20 (Screw) ×12 IMPLANT
PILLOW ARM CARTER ADULT (MISCELLANEOUS) ×3 IMPLANT
PLATE NARROW DVR RIGHT (Plate) ×3 IMPLANT
SCREW LOCK 10X2.7X3 LD THRD (Screw) ×2 IMPLANT
SCREW LOCKING 2.7X10MM (Screw) ×4 IMPLANT
SCREW LOCKING 2.7X13MM (Screw) ×6 IMPLANT
SCREW MULTI DIRECTIONAL 2.7X18 (Screw) ×3 IMPLANT
SCRUB BETADINE 4OZ XXX (MISCELLANEOUS) ×3 IMPLANT
SOL PREP POV-IOD 4OZ 10% (MISCELLANEOUS) ×3 IMPLANT
SPLINT FIBERGLASS 3X35 (CAST SUPPLIES) ×3 IMPLANT
SUT PROLENE 4 0 P 3 18 (SUTURE) ×6 IMPLANT
SUT PROLENE 4 0 PS 2 18 (SUTURE) ×3 IMPLANT
SYSTEM CHEST DRAIN TLS 7FR (DRAIN) ×3 IMPLANT
TOWEL OR 17X26 10 PK STRL BLUE (TOWEL DISPOSABLE) ×3 IMPLANT
TUBE CONNECTING 12'X1/4 (SUCTIONS) ×1
TUBE CONNECTING 12X1/4 (SUCTIONS) ×2 IMPLANT
TUBE EVACUATION TLS (MISCELLANEOUS) ×3 IMPLANT

## 2017-11-08 NOTE — Op Note (Signed)
NAMEALAILAH, [name] MEDICAL RECORD [id number] ACCOUNT [id number] DATE OF BIRTH:May 09, 1950 FACILITY: [facility] LOCATION: [facility] PHYSICIAN:Dosia Yodice M. Harlon Kutner, MD  OPERATIVE REPORT  DATE OF PROCEDURE:  11/08/2017  PREOPERATIVE DIAGNOSIS:  Comminuted complex intraarticular, greater than 5-part, right upper extremity distal radius fracture.  POSTOPERATIVE DIAGNOSES:  Comminuted complex intraarticular, greater than 5-part, right upper extremity distal radius fracture.  PROCEDURE: 1.  Open reduction internal fixation greater than 5-part intraarticular distal radius fracture, right upper extremity, with DVR plate and screw construct. 2.  Four view radiographic series right upper extremity/wrist.  SURGEON:  [name], MD  ASSISTANT:  None.  COMPLICATIONS:  None.  ANESTHESIA:  General with preoperative block.  TOURNIQUET TIME:  Less than an hour.  DRAINS:  One.     INDICATIONS:  The patient is a pleasant female, alert and oriented, in no acute distress.  She is examined and her examination is consistent with a significantly displaced distal radius fracture.  I have counseled regarding risks and benefits of surgery  and she desires to proceed.  DESCRIPTION OF PROCEDURE:  The patient was seen by myself and Anesthesia, taken to the operative theater and underwent smooth induction of general anesthesia.  Preoperatively, a block was placed at my direction.  Following this, she was given  preoperative antibiotics.  Sterile field was isolated and under sterile prep and drape.  A volar radial incision was made under 250 mm tourniquet control.  The FCR tendon sheath was incised dorsally and palmarly swept out of harm's way with carpal canal  contents placed ulnarly and the FCR placed radially.  We accessed the pronator, incised this and then dissected down to the fracture, which was very carefully exposed.  We were able to recreate normal geometry with provisional fixation methods and then   apply a DVR narrow Biomet plate and screw construct.  Radial height, inclination and volar tilt was able to be restored nicely.  Four view x-ray series was applied and looked excellent.  Thus, the ORIF greater than 5-part intraarticular distal radius fracture with DVR plate and screw construct from Biomet and stress radiography was accomplished.  I irrigated copiously at this time, checked final x-rays and made permanent x-rays for  documentation followed by closure of the pronator with Vicryl and closure of the skin edge over a TLS size #7 drain.  She will be monitored in recovery room.  We will plan to see her back in the office in 2 weeks.  We will discontinue the drain in the morning and allow her to go home today.  Elevate, movement, massage fingers and notify me should any problems occur.  This was a very standard ORIF, no complicating features.  AN/NUANCE  D:11/08/2017 T:11/08/2017 JOB:[id number]

## 2017-11-08 NOTE — Anesthesia Postprocedure Evaluation (Signed)
Anesthesia Post Note  Patient: [name]  Procedure(s) Performed: OPEN REDUCTION INTERNAL FIXATION (ORIF) WRIST FRACTURE (Right Wrist)     Patient location during evaluation: PACU Anesthesia Type: General Level of consciousness: awake and alert, awake and oriented Pain management: pain level controlled Vital Signs Assessment: post-procedure vital signs reviewed and stable Respiratory status: spontaneous breathing, nonlabored ventilation and respiratory function stable Cardiovascular status: blood pressure returned to baseline and stable Postop Assessment: no apparent nausea or vomiting Anesthetic complications: no    Last Vitals:  Vitals:   11/08/17 1200 11/08/17 1215  BP: 120/70 (!) 142/73  Pulse: 81 82  Resp: 14 16  Temp:  (!) 36.3 C  SpO2: 100% 100%    Last Pain:  Vitals:   11/08/17 1145  TempSrc:   PainSc: Asleep                 Catalina Gravel

## 2017-11-08 NOTE — Progress Notes (Signed)
Orthopedic Tech Progress Note Patient Details:  [name] 1950-08-08 [id number]  Patient ID: [name], female   DOB: 05-25-1950, 67 y.o.   MRN: [id number]   [name] 11/08/2017, 12:13 PM [place] arm pillow

## 2017-11-08 NOTE — Anesthesia Preprocedure Evaluation (Addendum)
Anesthesia Evaluation  Patient identified by MRN, date of birth, ID band Patient awake    Reviewed: Allergy & Precautions, NPO status , Patient's Chart, lab work & pertinent test results, reviewed documented beta blocker date and time   Airway Mallampati: II  TM Distance: >3 FB Neck ROM: Full    Dental  (+) Teeth Intact, Dental Advisory Given   Pulmonary former smoker,    Pulmonary exam normal breath sounds clear to auscultation       Cardiovascular hypertension, Pt. on home beta blockers and Pt. on medications + CAD, + Past MI, + CABG and +CHF  Normal cardiovascular exam+ dysrhythmias (LBBB) Atrial Fibrillation + pacemaker (BiV pacer)  Rhythm:Regular Rate:Normal     Neuro/Psych PSYCHIATRIC DISORDERS Anxiety  Neuromuscular disease    GI/Hepatic negative GI ROS, Neg liver ROS,   Endo/Other  diabetes, Type 2, Oral Hypoglycemic Agents  Renal/GU negative Renal ROS     Musculoskeletal  (+) Arthritis ,   Abdominal   Peds  Hematology negative hematology ROS (+)   Anesthesia Other Findings Day of surgery medications reviewed with the patient.  Reproductive/Obstetrics                             Anesthesia Physical Anesthesia Plan  ASA: IV  Anesthesia Plan: General   Post-op Pain Management:  Regional for Post-op pain   Induction: Intravenous  PONV Risk Score and Plan: 3 and Midazolam, Dexamethasone and Ondansetron  Airway Management Planned: LMA  Additional Equipment:   Intra-op Plan:   Post-operative Plan: Extubation in OR  Informed Consent: I have reviewed the patients History and Physical, chart, labs and discussed the procedure including the risks, benefits and alternatives for the proposed anesthesia with the patient or authorized representative who has indicated his/her understanding and acceptance.   Dental advisory given  Plan Discussed with: CRNA  Anesthesia Plan  Comments:         Anesthesia Quick Evaluation

## 2017-11-08 NOTE — Anesthesia Procedure Notes (Signed)
Procedure Name: LMA Insertion Date/Time: 11/08/2017 10:10 AM Performed by: Bryson Corona, CRNA Pre-anesthesia Checklist: Patient identified, Emergency Drugs available, Suction available and Patient being monitored Patient Re-evaluated:Patient Re-evaluated prior to induction Oxygen Delivery Method: Circle System Utilized Preoxygenation: Pre-oxygenation with 100% oxygen Induction Type: IV induction Ventilation: Mask ventilation without difficulty LMA: LMA inserted LMA Size: 4.0 Number of attempts: 1 Placement Confirmation: positive ETCO2 Tube secured with: Tape Dental Injury: Teeth and Oropharynx as per pre-operative assessment

## 2017-11-08 NOTE — Progress Notes (Signed)
Orthopedic Tech Progress Note Patient Details:  Carrieann Spielberg Oct 16, 1950 [id number]  Ortho Devices Type of Ortho Device: (carter arm pillow) Ortho Device/Splint Location: rue Ortho Device/Splint Interventions: Ordered  as ordered by Dr. Amedeo Plenty Post Interventions Patient Tolerated: Well Instructions Provided: Care of device   [name] 11/08/2017, 12:12 PM

## 2017-11-08 NOTE — Op Note (Signed)
See dictation#[id number] SP ORIF DRF Right Jaydee Conran MD

## 2017-11-08 NOTE — Anesthesia Procedure Notes (Signed)
Anesthesia Regional Block: Supraclavicular block   Pre-Anesthetic Checklist: ,, timeout performed, Correct Patient, Correct Site, Correct Laterality, Correct Procedure, Correct Position, site marked, Risks and benefits discussed,  Surgical consent,  Pre-op evaluation,  At surgeon's request and post-op pain management  Laterality: Right  Prep: chloraprep       Needles:  Injection technique: Single-shot  Needle Type: Echogenic Needle     Needle Length: 9cm  Needle Gauge: 21     Additional Needles:   Procedures:,,,, ultrasound used (permanent image in chart),,,,  Narrative:  Start time: 11/08/2017 9:19 AM End time: 11/08/2017 9:24 AM Injection made incrementally with aspirations every 5 mL.  Performed by: Personally  Anesthesiologist: Catalina Gravel, MD  Additional Notes: No pain on injection. No increased resistance to injection. Injection made in 5cc increments.  Good needle visualization.  Patient tolerated procedure well.

## 2017-11-08 NOTE — Discharge Instructions (Signed)
Please elevate move and massage her fingers as instructed.  Please remove the drain in the morning and change the red top tube every 4 hours.  Please notify should any problems occur.  We recommend that you to take vitamin C 1000 mg a day to promote healing. We also recommend that if you require  pain medicine that you take a stool softener to prevent constipation as most pain medicines will have constipation side effects. We recommend either Peri-Colace or Senokot and recommend that you also consider adding MiraLAX as well to prevent the constipation affects from pain medicine if you are required to use them. These medicines are over the counter and may be purchased at a local pharmacy. A cup of yogurt and a probiotic can also be helpful during the recovery process as the medicines can disrupt your intestinal environment. Keep bandage clean and dry.  Call for any problems.  No smoking.  Criteria for driving a car: you should be off your pain medicine for 7-8 hours, able to drive one handed(confident), thinking clearly and feeling able in your judgement to drive. Continue elevation as it will decrease swelling.  If instructed by MD move your fingers within the confines of the bandage/splint.  Use ice if instructed by your MD. Call immediately for any sudden loss of feeling in your hand/arm or change in functional abilities of the extremity.

## 2017-11-08 NOTE — Progress Notes (Signed)
Discharge instructions were reviewed with pt and pt husband. tls drain instructions were given to pt by MD and RN. Pt and husband demonstrated understanding of removing drain and changing drain every 4 hours.

## 2017-11-08 NOTE — H&P (Signed)
[name] is an 68 y.o. female.   Chief Complaint: Right distal radius fracture HPI: Patient presents for open reduction internal fixation right distal radius fracture.  I discussed with her all issues.  She has had evaluation and preop medical clearance completed.  I reviewed all issues with her at length and the findings.  Our plan will be ORIF right distal radius fracture.  Past Medical History:  Diagnosis Date  . AICD (automatic cardioverter/defibrillator) present 01/2013   Biventricular cardiac pacemaker in situ   . Allergic rhinitis   . CAD (coronary artery disease)    a. 2004: s/p MI in [place]. No PCI->Medical RX;  b. 07/2012 Cath: LM 30-40, LAD 70p, 70/72m D1 80-90p, OM1 small 90p, OM2 large 80-90p, 545m70-80d, RCA 20-30 diff, EF 40%, glob HK.s/p CABG  . [facility]   Patient reports left breast cancer diagnosis in 2002 treated with bilateral mastectomy positive lymph nodes with left axillary dissection followed by chemotherapy of unknown type  . Cataract   . Diabetes mellitus type II   . Exertional shortness of breath   . Hyperlipidemia   . Hypertension   . Ischemic cardiomyopathy    a. 07/2012 Echo: EF 35%, Sev inferoseptal HK, mildly dil LA, Peak PASP 5981m.  . LMarland KitchenBB (left bundle branch block)    a. intermittent - present during rapid afib 07/2012.  . NMarland Kitchenuromuscular disorder ([facility]  Patient reports chronic numbness in the right foot related to previous surgery on the right leg and "nerve damage"  . PA[place] ([place]oxysmal [place]) [place]  a. 07/2012: Amio and xarelto initiated.  . SBO (small bowel obstruction) ([facility]   Past Surgical History:  Procedure Laterality Date  . ABDOMINAL HYSTERECTOMY  2000  . APPENDECTOMY  1974  . BI-VENTRICULAR PACEMAKER INSERTION N/A 01/25/2013   Procedure: BI-VENTRICULAR PACEMAKER INSERTION (CRT-P);  Surgeon: GreEvans LanceD;  [facility];  Service: Cardiovascular;  Laterality: N/A;  . BREAST  BIOPSY Left 2002  . CARDIAC CATHETERIZATION    . CHOLECYSTECTOMY OPEN  1974  . CORONARY ARTERY BYPASS GRAFT N/A 09/28/2012   Procedure: CORONARY ARTERY BYPASS GRAFTING (CABG);  Surgeon: EdwGrace IsaacD;  [facility] [place];  Laterality: N/A;  CABG x four, using left internal mammary artery and left leg greater saphenous vein harvested endoscopically  . DILATION AND CURETTAGE OF UTERUS  1983  . EPICARDIAL PACING LEAD PLACEMENT N/A 09/28/2012   Procedure: EPICARDIAL PACING LEAD PLACEMENT;  Surgeon: EdwGrace IsaacD;  [facility] [place];  Laterality: N/A;  LV LEAD PLACEMENT  . HERNIA REPAIR    . INCISIONAL HERNIA REPAIR N/A 05/25/2015   Procedure: LAPAROSCOPIC INCISIONAL HERNIA WITH MESH ;  Surgeon: MatRolm BookbinderD;  [facility] [place];  Laterality: N/A;  . INCONTINENCE SURGERY  2000  . INSERTION OF MESH N/A 05/25/2015   Procedure: INSERTION OF MESH;  Surgeon: MatRolm BookbinderD;  [facility];  Laterality: N/A;  . INTRAOPERATIVE TRANSESOPHAGEAL ECHOCARDIOGRAM N/A 09/28/2012   Procedure: INTRAOPERATIVE TRANSESOPHAGEAL ECHOCARDIOGRAM;  Surgeon: EdwGrace IsaacD;  [facility] [place] [place] [facility];  Laterality: N/A;  . LAPAROSCOPIC INCISIONAL / UMBILICAL / VENTRAL HERNIA REPAIR  05/25/2015   IHR  . LEFT HEART CATHETERIZATION WITH CORONARY ANGIOGRAM N/A 07/20/2012   Procedure: LEFT HEART CATHETERIZATION WITH CORONARY ANGIOGRAM;  Surgeon: Peter M JorMartiniqueD;  [facility];  Service: Cardiovascular;  Laterality: N/A;  . MASTECTOMY Right 2002  . MASTECTOMY MODIFIED RADICAL W/ AXILLARY LYMPH NODES W/ OR W/O PECTORALIS MINOR Left 2002  . MAZE N/A 09/28/2012   Procedure: MAZE;  Surgeon: [name], MD;  Location: [facility];  Service: Open Heart Surgery;  Laterality: N/A;  . TENDON REPAIR Right 2001 X 3-4   torn ligaments and tendons in ankle up to knee from work related accident  .  TUBAL LIGATION  1987    Family History  Problem Relation Age of Onset  . Kidney failure Mother   . Heart disease Mother        Died mid 74Q complications of diabetes  . Colon cancer Sister   . Arthritis Other   . Cancer Other        ovarian  . Diabetes Other   . Hyperlipidemia Other   . Liver cancer Brother   . Stomach cancer Neg Hx   . Rectal cancer Neg Hx   . Esophageal cancer Neg Hx    Social History:  reports that she quit smoking about 5 years ago. Her smoking use included cigarettes. She has a 10.00 pack-year smoking history. She has never used smokeless tobacco. She reports that she does not drink alcohol or use drugs.  Allergies:  Allergies  Allergen Reactions  . Statins Other (See Comments)    Myalgias with rosuvastatin, atorvastatin and simvastatin     Medications Prior to Admission  Medication Sig Dispense Refill  . aspirin 81 MG tablet Take 1 tablet (81 mg total) by mouth daily. 30 tablet   . canagliflozin (INVOKANA) 100 MG TABS tablet Take 1 tablet (100 mg total) by mouth every morning. 30 tablet 11  . carvedilol (COREG) 25 MG tablet TAKE 0.5 TABLETS (12.5 MG TOTAL) BY MOUTH 2 (TWO) TIMES DAILY.   -- NEW LOWER DOSE!!! (Patient taking differently: Take 25 mg by mouth 2 (two) times daily with a meal. ) 100 tablet 4  . Dulaglutide (TRULICITY) 1.5 VZ/5.6LO SOPN INJECT 1.5 MG INTO THE SKIN ONCE A WEEK.   NEED FOLLOW UP FOR FUTURE REFILLS 4 pen 11  . Evolocumab (REPATHA SURECLICK) 756 MG/ML SOAJ Inject 140 mg into the skin every 14 (fourteen) days. 2 pen 11  . glipiZIDE (GLUCOTROL) 5 MG tablet TAKE ONE TABLET BY MOUTH TWICE A DAY BEFORE A MEAL (Patient taking differently: Take 5 mg by mouth 2 (two) times daily before a meal. TAKE ONE TABLET BY MOUTH TWICE A DAY BEFORE A MEAL) 200 tablet 4  . losartan (COZAAR) 25 MG tablet Take 1 tablet (25 mg total) by mouth 2 (two) times daily. 180 tablet 4  . metFORMIN (GLUCOPHAGE) 1000 MG tablet TAKE ONE TABLET BY MOUTH TWICE A DAY WITH  FOOD (Patient taking differently: Take 1,000 mg by mouth 2 (two) times daily with a meal. TAKE ONE TABLET BY MOUTH TWICE A DAY WITH FOOD) 200 tablet 4  . rivaroxaban (XARELTO) 20 MG TABS tablet Take 1 tablet (20 mg total) by mouth daily with supper. 90 tablet 1  . BAYER MICROLET LANCETS lancets Use as instructed 100 each 2  . Blood Glucose Monitoring Suppl (BAYER CONTOUR NEXT LINK) w/Device KIT Use to check blood sugar 2 times per day. 1 kit 2  . furosemide (LASIX) 20 MG tablet Take 1 tablet (20 mg total) by mouth daily. 90 tablet 3  . glucose blood (CONTOUR NEXT TEST) test strip Use as instructed to check sugar 2 times daily  200 each 5  . nitroGLYCERIN (NITROSTAT) 0.4 MG SL tablet Place 1 tablet (0.4 mg total) under the tongue every 5 (five) minutes as needed for chest pain. 25 tablet 3    No results found for this or any previous visit (from the past 48 hour(s)). No results found.  Review of Systems  Respiratory: Negative.   Cardiovascular: Negative.   Gastrointestinal: Negative.   Genitourinary: Negative.     Blood pressure (!) 99/53, pulse 82, temperature 98.6 F (37 C), temperature source Oral, resp. rate 16, height 5' 4.5" (1.638 m), weight 68.9 kg, SpO2 98 %. Physical Exam  Right displaced distal radius fracture no evidence of infection dystrophy or vascular compromise no evidence of instability.  I discussed all issues with the patient at great length.   The patient is alert and oriented in no acute distress. The patient complains of pain in the affected upper extremity.  The patient is noted to have a normal HEENT exam. Lung fields show equal chest expansion and no shortness of breath. Abdomen exam is nontender without distention. Lower extremity examination does not show any fracture dislocation or blood clot symptoms. Pelvis is stable and the neck and back are stable and nontender. Assessment/Plan We will plan for open reduction internal fixation right distal radius  fracture with repair reconstruction is necessary.  She is neurovascularly intact and we will not plan any formal carpal tunnel release. We are planning surgery for your upper extremity. The risk and benefits of surgery to include risk of bleeding, infection, anesthesia,  damage to normal structures and failure of the surgery to accomplish its intended goals of relieving symptoms and restoring function have been discussed in detail. With this in mind we plan to proceed. I have specifically discussed with the patient the pre-and postoperative regime and the dos and don'ts and risk and benefits in great detail. Risk and benefits of surgery also include risk of dystrophy(CRPS), chronic nerve pain, failure of the healing process to go onto completion and other inherent risks of surgery The relavent the pathophysiology of the disease/injury process, as well as the alternatives for treatment and postoperative course of action has been discussed in great detail with the patient who desires to proceed.  We will do everything in our power to help you (the patient) restore function to the upper extremity. It is a pleasure to see this patient today.  Willa Frater III, MD 11/08/2017, 7:54 AM

## 2017-11-08 NOTE — Transfer of Care (Signed)
Immediate Anesthesia Transfer of Care Note  Patient: [name]  Procedure(s) Performed: OPEN REDUCTION INTERNAL FIXATION (ORIF) WRIST FRACTURE (Right Wrist)  Patient Location: PACU  Anesthesia Type:General  Level of Consciousness: awake, oriented and patient cooperative  Airway & Oxygen Therapy: Patient Spontanous Breathing and Patient connected to nasal cannula oxygen  Post-op Assessment: Report given to RN and Post -op Vital signs reviewed and stable  Post vital signs: Reviewed  Last Vitals:  Vitals Value Taken Time  BP 148/78 11/08/2017 11:24 AM  Temp    Pulse 82 11/08/2017 11:24 AM  Resp 12 11/08/2017 11:24 AM  SpO2 99 % 11/08/2017 11:24 AM  Vitals shown include unvalidated device data.  Last Pain:  Vitals:   11/08/17 0739  TempSrc: Oral  PainSc: 5       Patients Stated Pain Goal: 3 (75/42/37 0230)  Complications: No apparent anesthesia complications

## 2017-11-09 ENCOUNTER — Encounter: Payer: Self-pay | Admitting: Cardiovascular Disease

## 2017-11-11 ENCOUNTER — Encounter (HOSPITAL_COMMUNITY): Payer: Self-pay | Admitting: Orthopedic Surgery

## 2017-11-11 ENCOUNTER — Ambulatory Visit (INDEPENDENT_AMBULATORY_CARE_PROVIDER_SITE_OTHER): Payer: BLUE CROSS/BLUE SHIELD | Admitting: *Deleted

## 2017-11-11 DIAGNOSIS — I5042 Chronic combined systolic (congestive) and diastolic (congestive) heart failure: Secondary | ICD-10-CM

## 2017-11-11 DIAGNOSIS — I255 Ischemic cardiomyopathy: Secondary | ICD-10-CM

## 2017-11-11 NOTE — Progress Notes (Signed)
Remote pacemaker transmission.   

## 2017-11-14 ENCOUNTER — Encounter (HOSPITAL_COMMUNITY): Payer: Self-pay | Admitting: Orthopedic Surgery

## 2017-11-20 ENCOUNTER — Encounter: Payer: Self-pay | Admitting: Internal Medicine

## 2017-11-20 ENCOUNTER — Ambulatory Visit (INDEPENDENT_AMBULATORY_CARE_PROVIDER_SITE_OTHER): Payer: BLUE CROSS/BLUE SHIELD | Admitting: Internal Medicine

## 2017-11-20 VITALS — BP 118/70 | HR 90 | Ht 65.0 in | Wt 154.0 lb

## 2017-11-20 DIAGNOSIS — E1159 Type 2 diabetes mellitus with other circulatory complications: Secondary | ICD-10-CM | POA: Diagnosis not present

## 2017-11-20 DIAGNOSIS — E559 Vitamin D deficiency, unspecified: Secondary | ICD-10-CM

## 2017-11-20 DIAGNOSIS — E782 Mixed hyperlipidemia: Secondary | ICD-10-CM

## 2017-11-20 NOTE — Progress Notes (Signed)
Patient ID: [name], female   DOB: 09/21/1950, 67 y.o.   MRN: [id number]  HPI: [name] is a 67 y.o.-year-old female, returning for f/u for DM2, dx 2002, insulin-independent, uncontrolled, with complications (CAD, s/p CABG) and vitamin D deficiency. Last visit 7 months ago.  She had a wrist fx and Sx 10/2017.  Her forearm is still in a cast.  She has not been going out for walks lately.  She moved recently.  She  stopped checking her sugars after last visit and only started 2 to 3 days ago.  DM2: Last hemoglobin A1c was: Lab Results  Component Value Date   HGBA1C 7.9 (H) 11/08/2017   HGBA1C 7.3 04/29/2017   HGBA1C 6.9 10/04/2016  Patient has been on insulin before >> hypoglycemia.   Pt is on a regimen of: - Metformin 2000 mg with dinner - Invokana 100 mg in a.m. - Trulicity 1.5 mg daily weekly - Glipizide 5 mg before breakfast and dinner Started apple cider vinegar. Could not afford Januvia 100 mg daily >> 100$ reduced from 348%. She was on Amaryl 2 mg bid. She was previously on Bydureon.  She restarted checking sugars twice a day 2 to 3 days ago: - am:90s, 178 >> 150-208 >> 140-155 - after b'fast; >> 155-199, 235 >> n/c - before lunch:  70-80s >> 81-150, 171 >> n/c - after lunch: 101-106 >> n/c >> 165-209 >> n/c - before dinner:  100-115 >> 119, 131-187 >> n/c >> 99 - after dinner:  128-197 >> 89 - bedtime:  n/c >> 179, 205, 260 >> n/c Lowest sugar was 81 >> 89; she has hypoglycemia awareness in the 60s. Highest sugar was 200s >> 155.  -No CKD, last BUN/creatinine: Lab Results  Component Value Date   BUN 16 11/08/2017   CREATININE 0.72 11/08/2017  On losartan. -+ HL; last set of lipids: Lab Results  Component Value Date   CHOL 116 06/26/2017   HDL 61 06/26/2017   LDLCALC 25 06/26/2017   LDLDIRECT 50 04/04/2016   TRIG 151 (H) 06/26/2017   CHOLHDL 1.9 06/26/2017  She is back on Repatha- initially started in 10/2015, then had to stop due to lack of  insurance coverage.  She has intolerance to statins.  She is seeing Dr. Sallyanne Kuster. - last eye exam was 06/2016: No DR. Has cataracts - had surgery in 10/2015.  She has a history of laser surgery. -No numbness and tingling in her feet. On ASA 81.  History of vitamin D deficiency:  Previous levels reviewed- latest level was slightly low: Lab Results  Component Value Date   VD25OH 26.08 (L) 10/04/2016   VD25OH 38.79 02/22/2016   VD25OH 12.35 (L) 10/10/2015   She is on vitamin D 5000 units daily.  She has a h/o BrCA  - s/p B mastectomy.  ROS: Constitutional: no weight gain/no weight loss, no fatigue, no subjective hyperthermia, no subjective hypothermia Eyes: no blurry vision, no xerophthalmia ENT: no sore throat, no nodules palpated in throat, no dysphagia, no odynophagia, no hoarseness Cardiovascular: no CP/no SOB/no palpitations/no leg swelling Respiratory: no cough/no SOB/no wheezing Gastrointestinal: no N/no V/no D/no C/no acid reflux Musculoskeletal: no muscle aches/no joint aches, + bone pain Skin: no rashes, no hair loss Neurological: no tremors/no numbness/no tingling/no dizziness  I reviewed pt's medications, allergies, PMH, social hx, family hx, and changes were documented in the history of present illness. Otherwise, unchanged from my initial visit note.  Past Medical History:  Diagnosis Date  . AICD (automatic  cardioverter/defibrillator) present 01/2013   Biventricular cardiac pacemaker in situ   . Allergic rhinitis   . CAD (coronary artery disease)    a. 2004: s/p MI in [place]. No PCI->Medical RX;  b. 07/2012 Cath: LM 30-40, LAD 70p, 70/54m D1 80-90p, OM1 small 90p, OM2 large 80-90p, 579m70-80d, RCA 20-30 diff, EF 40%, glob HK.s/p CABG  . [facility]   Patient reports left breast cancer diagnosis in 2002 treated with bilateral mastectomy positive lymph nodes with left axillary dissection followed by chemotherapy of unknown type  . Cataract   .  Diabetes mellitus type II   . Exertional shortness of breath   . Hyperlipidemia   . Hypertension   . Ischemic cardiomyopathy    a. 07/2012 Echo: EF 35%, Sev inferoseptal HK, mildly dil LA, Peak PASP 5917m.  . LMarland KitchenBB (left bundle branch block)    a. intermittent - present during rapid afib 07/2012.  . NMarland Kitchenuromuscular disorder ([place]  Patient reports chronic numbness in the right foot related to previous surgery on the right leg and "nerve damage"  . PAF (paroxysmal atrial fibrillation) ([place]  a. 07/2012: Amio and xarelto initiated.  . SBO (small bowel obstruction) ([facility] [place]  Past Surgical History:  Procedure Laterality Date  . ABDOMINAL HYSTERECTOMY  2000  . APPENDECTOMY  1974  . BI-VENTRICULAR PACEMAKER INSERTION N/A 01/25/2013   Procedure: BI-VENTRICULAR PACEMAKER INSERTION (CRT-P);  Surgeon: GreEvans LanceD;  [facility];  Service: Cardiovascular;  Laterality: N/A;  . BREAST BIOPSY Left 2002  . CARDIAC CATHETERIZATION    . CHOLECYSTECTOMY OPEN  1974  . CORONARY ARTERY BYPASS GRAFT N/A 09/28/2012   Procedure: CORONARY ARTERY BYPASS GRAFTING (CABG);  Surgeon: EdwGrace IsaacD;  [facility];  Laterality: N/A;  CABG x four, using left internal mammary artery and left leg greater saphenous vein harvested endoscopically  . DILATION AND CURETTAGE OF UTERUS  1983  . EPICARDIAL PACING LEAD PLACEMENT N/A 09/28/2012   Procedure: EPICARDIAL PACING LEAD PLACEMENT;  Surgeon: EdwGrace IsaacD;  [facility];  Laterality: N/A;  LV LEAD PLACEMENT  . HERNIA REPAIR    . INCISIONAL HERNIA REPAIR N/A 05/25/2015   Procedure: LAPAROSCOPIC INCISIONAL HERNIA WITH MESH ;  Surgeon: MatRolm BookbinderD;  [facility];  Laterality: N/A;  . INCONTINENCE SURGERY  2000  . INSERTION OF MESH N/A 05/25/2015   Procedure: INSERTION OF MESH;  Surgeon: MatRolm BookbinderD;  [facility] [place];  Laterality:  N/A;  . INTRAOPERATIVE TRANSESOPHAGEAL ECHOCARDIOGRAM N/A 09/28/2012   Procedure: INTRAOPERATIVE TRANSESOPHAGEAL ECHOCARDIOGRAM;  Surgeon: EdwGrace IsaacD;  [facility] [place];  Laterality: N/A;  . LAPAROSCOPIC INCISIONAL / UMBILICAL / VENTRAL HERNIA REPAIR  05/25/2015   IHR  . LEFT HEART CATHETERIZATION WITH CORONARY ANGIOGRAM N/A 07/20/2012   Procedure: LEFT HEART CATHETERIZATION WITH CORONARY ANGIOGRAM;  Surgeon: Peter M JorMartiniqueD;  [facility];  Service: Cardiovascular;  Laterality: N/A;  . MASTECTOMY Right 2002  . MASTECTOMY MODIFIED RADICAL W/ AXILLARY LYMPH NODES W/ OR W/O PECTORALIS MINOR Left 2002  . MAZE N/A 09/28/2012   Procedure: MAZE;  Surgeon: EdwGrace IsaacD;  [facility] [place];  Laterality: N/A;  . ORIF WRIST FRACTURE Right 11/08/2017   Procedure: OPEN REDUCTION INTERNAL FIXATION (ORIF) WRIST FRACTURE;  Surgeon: GraRoseanne Kaufman  MD;  Location: [place];  Service: Orthopedics;  Laterality: Right;  90 mins  . TENDON REPAIR Right 2001 X 3-4   torn ligaments and tendons in ankle up to knee from work related accident  . TUBAL LIGATION  1987   Social History   Socioeconomic History  . Marital status: Married    Spouse name: Not on file  . Number of children: 3  . Years of education: Not on file  . Highest education level: Not on file  Occupational History  . Occupation: Marketing executive work    Fish farm manager: [employer]  Social Needs  . Financial resource strain: Not on file  . Food insecurity:    Worry: Not on file    Inability: Not on file  . Transportation needs:    Medical: Not on file    Non-medical: Not on file  Tobacco Use  . Smoking status: Former Smoker    Packs/day: 1.00    Years: 10.00    Pack years: 10.00    Types: Cigarettes    Last attempt to quit: 03/11/2012    Years since quitting: 5.6  . Smokeless tobacco: Never Used  Substance and Sexual Activity  . Alcohol use: No  . Drug use: No   . Sexual activity: Yes    Partners: Female    Birth control/protection: Surgical  Lifestyle  . Physical activity:    Days per week: Not on file    Minutes per session: Not on file  . Stress: Not on file  Relationships  . Social connections:    Talks on phone: Not on file    Gets together: Not on file    Attends religious service: Not on file    Active member of club or organization: Not on file    Attends meetings of clubs or organizations: Not on file    Relationship status: Not on file  . Intimate partner violence:    Fear of current or ex partner: Not on file    Emotionally abused: Not on file    Physically abused: Not on file    Forced sexual activity: Not on file  Other Topics Concern  . Not on file  Social History Narrative   Lives with husband and mother in law.     Regular exercise: walking   Caffeine use: 2 cups of coffee in the morning   Current Outpatient Medications on File Prior to Visit  Medication Sig Dispense Refill  . aspirin 81 MG tablet Take 1 tablet (81 mg total) by mouth daily. 30 tablet   . BAYER MICROLET LANCETS lancets Use as instructed 100 each 2  . Blood Glucose Monitoring Suppl (BAYER CONTOUR NEXT LINK) w/Device KIT Use to check blood sugar 2 times per day. 1 kit 2  . canagliflozin (INVOKANA) 100 MG TABS tablet Take 1 tablet (100 mg total) by mouth every morning. 30 tablet 11  . carvedilol (COREG) 25 MG tablet TAKE 0.5 TABLETS (12.5 MG TOTAL) BY MOUTH 2 (TWO) TIMES DAILY.   -- NEW LOWER DOSE!!! (Patient taking differently: Take 25 mg by mouth 2 (two) times daily with a meal. ) 100 tablet 4  . Dulaglutide (TRULICITY) 1.5 CZ/6.6AY SOPN INJECT 1.5 MG INTO THE SKIN ONCE A WEEK.   NEED FOLLOW UP FOR FUTURE REFILLS 4 pen 11  . Evolocumab (REPATHA SURECLICK) 301 MG/ML SOAJ Inject 140 mg into the skin every 14 (fourteen) days. 2 pen 11  . furosemide (LASIX) 20 MG tablet Take 1 tablet (20  mg total) by mouth daily. 90 tablet 3  . glipiZIDE (GLUCOTROL) 5 MG  tablet TAKE ONE TABLET BY MOUTH TWICE A DAY BEFORE A MEAL (Patient taking differently: Take 5 mg by mouth 2 (two) times daily before a meal. TAKE ONE TABLET BY MOUTH TWICE A DAY BEFORE A MEAL) 200 tablet 4  . glucose blood (CONTOUR NEXT [name]) [name] strip Use as instructed to check sugar 2 times daily 200 each 5  . losartan (COZAAR) 25 MG tablet Take 1 tablet (25 mg total) by mouth 2 (two) times daily. 180 tablet 4  . metFORMIN (GLUCOPHAGE) 1000 MG tablet TAKE ONE TABLET BY MOUTH TWICE A DAY WITH FOOD (Patient taking differently: Take 1,000 mg by mouth 2 (two) times daily with a meal. TAKE ONE TABLET BY MOUTH TWICE A DAY WITH FOOD) 200 tablet 4  . nitroGLYCERIN (NITROSTAT) 0.4 MG SL tablet Place 1 tablet (0.4 mg total) under the tongue every 5 (five) minutes as needed for chest pain. 25 tablet 3  . rivaroxaban (XARELTO) 20 MG TABS tablet Take 1 tablet (20 mg total) by mouth daily with supper. 90 tablet 1   No current facility-administered medications on file prior to visit.    Allergies  Allergen Reactions  . Statins Other (See Comments)    Myalgias with rosuvastatin, atorvastatin and simvastatin    Family History  Problem Relation Age of Onset  . Kidney failure Mother   . Heart disease Mother        Died mid 62G complications of diabetes  . Colon cancer Sister   . Arthritis Other   . Cancer Other        ovarian  . Diabetes Other   . Hyperlipidemia Other   . Liver cancer Brother   . Stomach cancer Neg Hx   . Rectal cancer Neg Hx   . Esophageal cancer Neg Hx     PE: BP 118/70   Pulse 90   Ht 5' 5"  (1.651 m)   Wt 154 lb (69.9 kg)   SpO2 97%   BMI 25.63 kg/m  Body mass index is 25.63 kg/m.  Wt Readings from Last 3 Encounters:  11/20/17 154 lb (69.9 kg)  11/08/17 152 lb (68.9 kg)  11/07/17 152 lb 3.2 oz (69 kg)   Constitutional: overweight, in NAD Eyes: PERRLA, EOMI, no exophthalmos ENT: moist mucous membranes, no thyromegaly, no cervical lymphadenopathy Cardiovascular:  RRR, No MRG Respiratory: CTA B Gastrointestinal: abdomen soft, NT, ND, BS+ Musculoskeletal: no deformities, strength intact in all 4 Skin: moist, warm, no rashes Neurological: no tremor with outstretched hands, DTR normal in all 4  ASSESSMENT: 1. 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 Vitamin D deficiency  3. HL  PLAN:  1. Patient with long-standing, uncontrolled, type 2 diabetes, on oral antidiabetic regimen and GLP-1 receptor agonist.  At last visit, sugars were worse, with more spikes in the 200s.  We increase her glipizide and discussed about improving her meals at that time.  However, HbA1c obtained last month was 7.9%, higher. - At this visit, she has only few sugars checked and they are higher than goal in the morning, but improved compared to last time.  In the evening, her sugars are at goal.  Therefore, we discussed about continuing the same medication regimen but she absolutely needs to start checking her sugars and she is aware  that she needs to let me know if they do not improve. - I advised her to: Patient Instructions  Please continue: - Metformin 2000 mg with dinner - Invokana 100 mg in a.m. - Trulicity 1.5 mg daily weekly - Glipizide 5 mg before breakfast and dinner  Please come back for a follow-up appointment in 3 months.  - continue checking sugars at different times of the day - check 1x a day, rotating checks - advised for yearly eye exams >> she is not UTD - Return to clinic in 3 mo with sugar log    2. Vitamin D deficiency -She has a history of vitamin D deficiency after which we increase her vitamin D supplement to 5000 units daily.  She continues on this dose. -She is due for another vitamin D level.  3. HL - Reviewed latest lipid panel from 06/2017: LDL much better -triglycerides now only slightly high. Lab Results   Component Value Date   CHOL 116 06/26/2017   HDL 61 06/26/2017   LDLCALC 25 06/26/2017   LDLDIRECT 50 04/04/2016   TRIG 151 (H) 06/26/2017   CHOLHDL 1.9 06/26/2017  - Continues PCSK9 inhibitor without side effects.  Philemon Kingdom, MD PhD [facility]

## 2017-11-20 NOTE — Patient Instructions (Signed)
Please continue: - Metformin 2000 mg with dinner - Invokana 100 mg in a.m. - Trulicity 1.5 mg daily weekly - Glipizide 5 mg before breakfast and dinner  Please come back for a follow-up appointment in 3 months.

## 2017-11-26 DIAGNOSIS — S52571D Other intraarticular fracture of lower end of right radius, subsequent encounter for closed fracture with routine healing: Secondary | ICD-10-CM | POA: Diagnosis not present

## 2017-11-26 DIAGNOSIS — S52572A Other intraarticular fracture of lower end of left radius, initial encounter for closed fracture: Secondary | ICD-10-CM | POA: Diagnosis not present

## 2017-12-03 ENCOUNTER — Encounter: Payer: BLUE CROSS/BLUE SHIELD | Admitting: Cardiovascular Disease

## 2017-12-08 LAB — CUP PACEART REMOTE DEVICE CHECK
Battery Remaining Longevity: 61 mo
Battery Voltage: 2.93 V
Brady Statistic AP VP Percent: 1 %
Brady Statistic AP VS Percent: 1 %
Brady Statistic RA Percent Paced: 1 %
Implantable Lead Implant Date: 20140721
Implantable Lead Implant Date: 20141117
Implantable Lead Location: 753858
Implantable Lead Model: 5071
Lead Channel Impedance Value: 400 Ohm
Lead Channel Impedance Value: 440 Ohm
Lead Channel Pacing Threshold Amplitude: 0.75 V
Lead Channel Pacing Threshold Pulse Width: 0.4 ms
Lead Channel Sensing Intrinsic Amplitude: 12 mV
Lead Channel Setting Pacing Amplitude: 2.5 V
Lead Channel Setting Pacing Pulse Width: 0.7 ms
Lead Channel Setting Sensing Sensitivity: 2 mV
MDC IDC LEAD IMPLANT DT: 20141117
MDC IDC LEAD LOCATION: 753859
MDC IDC LEAD LOCATION: 753860
MDC IDC MSMT BATTERY REMAINING PERCENTAGE: 80 %
MDC IDC MSMT LEADCHNL LV PACING THRESHOLD AMPLITUDE: 1.5 V
MDC IDC MSMT LEADCHNL LV PACING THRESHOLD PULSEWIDTH: 0.7 ms
MDC IDC MSMT LEADCHNL RA SENSING INTR AMPL: 1.7 mV
MDC IDC MSMT LEADCHNL RV IMPEDANCE VALUE: 550 Ohm
MDC IDC MSMT LEADCHNL RV PACING THRESHOLD AMPLITUDE: 0.75 V
MDC IDC MSMT LEADCHNL RV PACING THRESHOLD PULSEWIDTH: 0.4 ms
MDC IDC PG IMPLANT DT: 20141117
MDC IDC PG SERIAL: 2981305
MDC IDC SESS DTM: 20190903082111
MDC IDC SET LEADCHNL RA PACING AMPLITUDE: 2 V
MDC IDC SET LEADCHNL RV PACING AMPLITUDE: 2.5 V
MDC IDC SET LEADCHNL RV PACING PULSEWIDTH: 0.4 ms
MDC IDC STAT BRADY AS VP PERCENT: 99 %
MDC IDC STAT BRADY AS VS PERCENT: 1 %

## 2017-12-15 ENCOUNTER — Other Ambulatory Visit: Payer: Self-pay

## 2017-12-15 DIAGNOSIS — M25531 Pain in right wrist: Secondary | ICD-10-CM | POA: Diagnosis not present

## 2017-12-15 DIAGNOSIS — Z4789 Encounter for other orthopedic aftercare: Secondary | ICD-10-CM | POA: Diagnosis not present

## 2017-12-15 DIAGNOSIS — S52571D Other intraarticular fracture of lower end of right radius, subsequent encounter for closed fracture with routine healing: Secondary | ICD-10-CM | POA: Diagnosis not present

## 2017-12-15 MED ORDER — RIVAROXABAN 20 MG PO TABS: 20.0000 mg | ORAL_TABLET | Freq: Every day | ORAL | 1 refills | Status: DC

## 2017-12-16 DIAGNOSIS — M25531 Pain in right wrist: Secondary | ICD-10-CM | POA: Diagnosis not present

## 2017-12-19 DIAGNOSIS — M25531 Pain in right wrist: Secondary | ICD-10-CM | POA: Diagnosis not present

## 2017-12-22 ENCOUNTER — Other Ambulatory Visit: Payer: Self-pay | Admitting: Cardiovascular Disease

## 2017-12-22 NOTE — Telephone Encounter (Signed)
New Message    Pt c/o medication issue:  1. Name of Medication: Repathat  2. How are you currently taking this medication (dosage and times per day)?   3. Are you having a reaction (difficulty breathing--STAT)?   4. What is your medication issue? Naima with [facility] pharmacy is calling in reference to a prior authorization that is needed for repatha. Please call to discuss.

## 2017-12-23 DIAGNOSIS — M25531 Pain in right wrist: Secondary | ICD-10-CM | POA: Diagnosis not present

## 2017-12-23 MED ORDER — EVOLOCUMAB 140 MG/ML ~~LOC~~ SOAJ: 140.0000 mg | SUBCUTANEOUS | 11 refills | Status: DC

## 2017-12-25 DIAGNOSIS — M25531 Pain in right wrist: Secondary | ICD-10-CM | POA: Diagnosis not present

## 2017-12-30 DIAGNOSIS — M25531 Pain in right wrist: Secondary | ICD-10-CM | POA: Diagnosis not present

## 2017-12-31 ENCOUNTER — Telehealth: Payer: Self-pay

## 2017-12-31 DIAGNOSIS — R040 Epistaxis: Secondary | ICD-10-CM

## 2017-12-31 NOTE — Telephone Encounter (Signed)
Croitoru, Dani Gobble, MD  Sereniti Wan, Dionne Bucy, CMA        I am sorry to hear you are having problems, Mrs. Woodford.  If my records are correct, you are taking both aspirin and Xarelto. Please stop taking the aspirin. I will set you up to have labs done (CBC, BMET).   If the nosebleeds persist beyond a week after stopping aspirin, you should probably see an ENT specialist; for example Dr. Izora Gala [phone] or Radene Journey [phone].  Hope this helps,  Dr. Romualdo Bolk, please    Labs ordered. Patient notified. Will return for labs on Friday.

## 2018-01-02 DIAGNOSIS — M25531 Pain in right wrist: Secondary | ICD-10-CM | POA: Diagnosis not present

## 2018-01-02 DIAGNOSIS — R040 Epistaxis: Secondary | ICD-10-CM | POA: Diagnosis not present

## 2018-01-02 LAB — CBC
HEMATOCRIT: 39.1 % (ref 34.0–46.6)
HEMOGLOBIN: 13.2 g/dL (ref 11.1–15.9)
MCH: 30.3 pg (ref 26.6–33.0)
MCHC: 33.8 g/dL (ref 31.5–35.7)
MCV: 90 fL (ref 79–97)
Platelets: 227 10*3/uL (ref 150–450)
RBC: 4.36 x10E6/uL (ref 3.77–5.28)
RDW: 11.9 % — ABNORMAL LOW (ref 12.3–15.4)
WBC: 5.3 10*3/uL (ref 3.4–10.8)

## 2018-01-02 LAB — BASIC METABOLIC PANEL
BUN / CREAT RATIO: 21 (ref 12–28)
BUN: 15 mg/dL (ref 8–27)
CHLORIDE: 96 mmol/L (ref 96–106)
CO2: 24 mmol/L (ref 20–29)
CREATININE: 0.7 mg/dL (ref 0.57–1.00)
Calcium: 9.7 mg/dL (ref 8.7–10.3)
GFR calc non Af Amer: 91 mL/min/{1.73_m2} (ref >59)
GFR, EST AFRICAN AMERICAN: 105 mL/min/{1.73_m2} (ref >59)
Glucose: 228 mg/dL — ABNORMAL HIGH (ref 65–99)
Potassium: 4.4 mmol/L (ref 3.5–5.2)
Sodium: 140 mmol/L (ref 134–144)

## 2018-01-06 DIAGNOSIS — M25531 Pain in right wrist: Secondary | ICD-10-CM | POA: Diagnosis not present

## 2018-01-09 DIAGNOSIS — M25531 Pain in right wrist: Secondary | ICD-10-CM | POA: Diagnosis not present

## 2018-01-12 DIAGNOSIS — S52571D Other intraarticular fracture of lower end of right radius, subsequent encounter for closed fracture with routine healing: Secondary | ICD-10-CM | POA: Diagnosis not present

## 2018-01-12 DIAGNOSIS — M25531 Pain in right wrist: Secondary | ICD-10-CM | POA: Diagnosis not present

## 2018-01-13 ENCOUNTER — Encounter: Payer: Self-pay | Admitting: Family Medicine

## 2018-01-13 ENCOUNTER — Ambulatory Visit: Payer: BLUE CROSS/BLUE SHIELD | Admitting: Family Medicine

## 2018-01-13 VITALS — BP 102/62 | HR 93 | Temp 98.0°F | Wt 152.0 lb

## 2018-01-13 DIAGNOSIS — L259 Unspecified contact dermatitis, unspecified cause: Secondary | ICD-10-CM | POA: Diagnosis not present

## 2018-01-13 MED ORDER — FLUOCINONIDE 0.05 % EX OINT: 1.0000 "application " | TOPICAL_OINTMENT | Freq: Two times a day (BID) | CUTANEOUS | 1 refills | Status: DC

## 2018-01-13 NOTE — Patient Instructions (Signed)
Apply the Lidex gel small amounts twice daily  Return as needed  Thank you for coming to see me all these years.  Good luck to you and your family

## 2018-01-13 NOTE — Progress Notes (Signed)
[name] is a delightful 67 year old married female non-smoker comes in today for evaluation of a skin rash  She noticed a pruritic red raised lesion on her left inner thigh.  It is grown larger.  She is tried over-the-counter antibiotic ointment but it did not help.  .vs BP 102/62 (BP Location: Right Arm, Patient Position: Sitting, Cuff Size: Normal)   Pulse 93   Temp 98 F (36.7 C) (Oral)   Wt 152 lb (68.9 kg)   SpO2 98%   BMI 25.29 kg/m  Well-developed nourished female no acute distress vital signs stable she is afebrile examination of skin shows a streaky red rash consistent with contact dermatitis  1.  Contact dermatitis........... cortisone gel

## 2018-01-14 DIAGNOSIS — M25531 Pain in right wrist: Secondary | ICD-10-CM | POA: Diagnosis not present

## 2018-01-19 ENCOUNTER — Encounter: Payer: Self-pay | Admitting: Family Medicine

## 2018-01-23 DIAGNOSIS — M25531 Pain in right wrist: Secondary | ICD-10-CM | POA: Diagnosis not present

## 2018-01-25 ENCOUNTER — Encounter: Payer: Self-pay | Admitting: Family Medicine

## 2018-02-09 ENCOUNTER — Other Ambulatory Visit: Payer: Self-pay | Admitting: Family Medicine

## 2018-02-09 ENCOUNTER — Ambulatory Visit: Payer: BLUE CROSS/BLUE SHIELD | Admitting: Family Medicine

## 2018-02-09 ENCOUNTER — Encounter: Payer: Self-pay | Admitting: Family Medicine

## 2018-02-09 VITALS — BP 110/70 | HR 83 | Temp 98.5°F | Wt 148.7 lb

## 2018-02-09 DIAGNOSIS — R05 Cough: Secondary | ICD-10-CM

## 2018-02-09 DIAGNOSIS — R059 Cough, unspecified: Secondary | ICD-10-CM

## 2018-02-09 MED ORDER — DOXYCYCLINE HYCLATE 100 MG PO TABS: 100.0000 mg | ORAL_TABLET | Freq: Two times a day (BID) | ORAL | 0 refills | Status: DC

## 2018-02-09 MED ORDER — ALBUTEROL SULFATE (2.5 MG/3ML) 0.083% IN NEBU
2.5000 mg | INHALATION_SOLUTION | Freq: Once | RESPIRATORY_TRACT | Status: AC
  Administered 2018-02-09: 2.5 mg via RESPIRATORY_TRACT

## 2018-02-09 NOTE — Progress Notes (Signed)
[name] DOB: 1950/03/14 Encounter date: 02/09/2018  This is a 67 y.o. female who presents with Chief Complaint  Patient presents with  . Cough    History of present illness:  2 weeks ago cough started more mild. Sinus drip at that time. Then week of Thanksgiving woke every day with stronger cough. Saturday fever, yesterday in bed. Lost voice. Energy bad. Not taking any medication right now.     Allergies  Allergen Reactions  . Statins Other (See Comments)    Myalgias with rosuvastatin, atorvastatin and simvastatin    Current Meds  Medication Sig  . BAYER MICROLET LANCETS lancets Use as instructed  . Blood Glucose Monitoring Suppl (BAYER CONTOUR NEXT LINK) w/Device KIT Use to check blood sugar 2 times per day.  . canagliflozin (INVOKANA) 100 MG TABS tablet Take 1 tablet (100 mg total) by mouth every morning.  . carvedilol (COREG) 25 MG tablet TAKE 0.5 TABLETS (12.5 MG TOTAL) BY MOUTH 2 (TWO) TIMES DAILY.   -- NEW LOWER DOSE!!! (Patient taking differently: Take 25 mg by mouth 2 (two) times daily with a meal. )  . Dulaglutide (TRULICITY) 1.5 GG/2.6RS SOPN INJECT 1.5 MG INTO THE SKIN ONCE A WEEK.   NEED FOLLOW UP FOR FUTURE REFILLS  . Evolocumab (REPATHA SURECLICK) 854 MG/ML SOAJ Inject 140 mg into the skin every 14 (fourteen) days.  . fluocinonide ointment (LIDEX) 6.27 % Apply 1 application topically 2 (two) times daily.  . furosemide (LASIX) 20 MG tablet Take 1 tablet (20 mg total) by mouth daily.  Marland Kitchen glipiZIDE (GLUCOTROL) 5 MG tablet TAKE ONE TABLET BY MOUTH TWICE A DAY BEFORE A MEAL (Patient taking differently: Take 5 mg by mouth 2 (two) times daily before a meal. TAKE ONE TABLET BY MOUTH TWICE A DAY BEFORE A MEAL)  . glucose blood (CONTOUR NEXT TEST) test strip Use as instructed to check sugar 2 times daily  . losartan (COZAAR) 25 MG tablet Take 1 tablet (25 mg total) by mouth 2 (two) times daily.  . metFORMIN (GLUCOPHAGE) 1000 MG tablet TAKE ONE TABLET BY MOUTH TWICE A DAY  WITH FOOD (Patient taking differently: Take 1,000 mg by mouth 2 (two) times daily with a meal. TAKE ONE TABLET BY MOUTH TWICE A DAY WITH FOOD)  . nitroGLYCERIN (NITROSTAT) 0.4 MG SL tablet Place 1 tablet (0.4 mg total) under the tongue every 5 (five) minutes as needed for chest pain.  . rivaroxaban (XARELTO) 20 MG TABS tablet Take 1 tablet (20 mg total) by mouth daily with supper.  . [DISCONTINUED] aspirin 81 MG tablet Take 1 tablet (81 mg total) by mouth daily.   Current Facility-Administered Medications for the 02/09/18 encounter (Office Visit) with Caren Macadam, MD  Medication  . albuterol (PROVENTIL) (2.5 MG/3ML) 0.083% nebulizer solution 2.5 mg    Review of Systems  Constitutional: Positive for fatigue and fever. Negative for chills.  HENT: Positive for congestion, postnasal drip, sinus pressure, sinus pain and sore throat. Negative for ear pain.   Respiratory: Positive for cough and shortness of breath (slight). Negative for wheezing.   Cardiovascular: Negative for chest pain.    Objective:  BP 110/70 (BP Location: Right Arm, Patient Position: Sitting, Cuff Size: Large)   Pulse 83   Temp 98.5 F (36.9 C) (Oral)   Wt 148 lb 11.2 oz (67.4 kg)   SpO2 96%   BMI 24.74 kg/m   Weight: 148 lb 11.2 oz (67.4 kg)   BP Readings from Last 3 Encounters:  02/09/18 110/70  01/13/18 102/62  11/20/17 118/70   Wt Readings from Last 3 Encounters:  02/09/18 148 lb 11.2 oz (67.4 kg)  01/13/18 152 lb (68.9 kg)  11/20/17 154 lb (69.9 kg)    Physical Exam  Constitutional: She appears well-developed and well-nourished.  Non-toxic appearance. She appears ill.  HENT:  Right Ear: Tympanic membrane, external ear and ear canal normal.  Left Ear: Tympanic membrane, external ear and ear canal normal.  Nose: Mucosal edema present. Right sinus exhibits no maxillary sinus tenderness and no frontal sinus tenderness. Left sinus exhibits no maxillary sinus tenderness and no frontal sinus tenderness.   Mouth/Throat: Uvula is midline. Posterior oropharyngeal erythema present. No oropharyngeal exudate or posterior oropharyngeal edema.  Cardiovascular: Normal rate, regular rhythm and normal heart sounds.  Pulmonary/Chest: Effort normal. She has decreased breath sounds (slightly). She has no wheezes. She has no rhonchi. She has no rales.  Some improvement in air exchange with albuterol treatment. Mild.     Assessment/Plan  1. Cough Due to duration with worsening of sx; will cover with doxycycline. Albuterol tx in office did open her up some on exam, but without perceived change. If she requests later on and notes change I would be ok with albuterol inhaler.  - doxycycline (VIBRA-TABS) 100 MG tablet; Take 1 tablet (100 mg total) by mouth 2 (two) times daily for 7 days.  Dispense: 14 tablet; Refill: 0 - albuterol (PROVENTIL) (2.5 MG/3ML) 0.083% nebulizer solution 2.5 mg - PR INHAL RX, AIRWAY OBST/DX SPUTUM INDUCT     Return if symptoms worsen or fail to improve.     Micheline Rough, MD

## 2018-02-10 ENCOUNTER — Ambulatory Visit (INDEPENDENT_AMBULATORY_CARE_PROVIDER_SITE_OTHER): Payer: BLUE CROSS/BLUE SHIELD

## 2018-02-10 DIAGNOSIS — I255 Ischemic cardiomyopathy: Secondary | ICD-10-CM | POA: Diagnosis not present

## 2018-02-10 NOTE — Progress Notes (Signed)
Remote pacemaker transmission.   

## 2018-02-13 DIAGNOSIS — M25531 Pain in right wrist: Secondary | ICD-10-CM | POA: Diagnosis not present

## 2018-02-16 ENCOUNTER — Encounter: Payer: Self-pay | Admitting: Internal Medicine

## 2018-02-16 ENCOUNTER — Ambulatory Visit (INDEPENDENT_AMBULATORY_CARE_PROVIDER_SITE_OTHER): Payer: BLUE CROSS/BLUE SHIELD

## 2018-02-16 ENCOUNTER — Ambulatory Visit: Payer: BLUE CROSS/BLUE SHIELD | Admitting: Internal Medicine

## 2018-02-16 VITALS — BP 112/64 | HR 92 | Temp 97.8°F | Wt 150.8 lb

## 2018-02-16 DIAGNOSIS — R0602 Shortness of breath: Secondary | ICD-10-CM

## 2018-02-16 DIAGNOSIS — Z950 Presence of cardiac pacemaker: Secondary | ICD-10-CM

## 2018-02-16 DIAGNOSIS — R05 Cough: Secondary | ICD-10-CM

## 2018-02-16 DIAGNOSIS — I255 Ischemic cardiomyopathy: Secondary | ICD-10-CM

## 2018-02-16 DIAGNOSIS — Z951 Presence of aortocoronary bypass graft: Secondary | ICD-10-CM

## 2018-02-16 DIAGNOSIS — Z853 Personal history of malignant neoplasm of breast: Secondary | ICD-10-CM | POA: Diagnosis not present

## 2018-02-16 DIAGNOSIS — I25810 Atherosclerosis of coronary artery bypass graft(s) without angina pectoris: Secondary | ICD-10-CM | POA: Diagnosis not present

## 2018-02-16 DIAGNOSIS — E1159 Type 2 diabetes mellitus with other circulatory complications: Secondary | ICD-10-CM

## 2018-02-16 DIAGNOSIS — R059 Cough, unspecified: Secondary | ICD-10-CM

## 2018-02-16 MED ORDER — ALBUTEROL SULFATE HFA 108 (90 BASE) MCG/ACT IN AERS: 2.0000 | INHALATION_SPRAY | Freq: Four times a day (QID) | RESPIRATORY_TRACT | 0 refills | Status: DC | PRN

## 2018-02-16 NOTE — Patient Instructions (Addendum)
X ray is reassuring and more consistent with COPD  Like an asthmatic  Reaction .   Try inhaler  As directed  Make appt with pulmonary .     consideration of other inhalers and meds  No evidence of hear problem causing this .  Trying to avoid  Prednisone cause if  Increases your blood sugar.

## 2018-02-16 NOTE — Progress Notes (Signed)
Chief Complaint  Patient presents with  . Cough    Somre improved. When she wakes up there is very little cough but as the day progresses the cough worsens annd by the end of the day she is "wiped out" and tired. Pt is getting concerned that there is more going on than "just a cough".  Pt states that she has 15 stairs in her home and previously had no problems walking up and down the steps but recently in the last week she has noticed some increased SOB and now some upper back/lung pain. No mucous production. Pt sees [facility] - Dr Lamonte Sakai.     HPI: [name] 67 y.o. come in for sda  For ongoing ongoing problem and now feels sob up stairs and concerned   3-4 weeks  Onset likea cough  Ans see last note.   Seen last week  Dr Raliegh Ip    given doxy and neb in office ? If helped or not   Cough. Ongoing and then this weekend  2 days ago  Noted   Dec exercise toleracne   Remote hx of  tobaccopPack per week  Remote hx ? 12 years ogg? Steroid  Inc sugar levels  So wants to avoid this  intervention She has seen pulm in past for doe 2016  Has  Hx heart sugery2012and breast cancer surgery  2002but no radiation to lungs.  Flu shot utd/ ROS: See pertinent positives and negatives per HPI. No cp hemoptysis  has Hyperlipidemia; Essential hypertension; MYOCARDIAL INFARCTION, HX OF; Allergic rhinitis; BREAST CANCER, HX OF; Anogenital warts; Ischemic cardiomyopathy; CAD (coronary artery disease); PAF (paroxysmal atrial fibrillation) ([place]); Right carotid bruit; S/P CABG x 4: 09/28/12 (LIMA-LAD, SVG-OM1-OM2, SVG-Intermediate); Chronic combined systolic and diastolic CHF, NYHA class 2 ([place]); Biventricular cardiac pacemaker - St Jude, Nov 2014; High anion gap metabolic acidosis; Acute on chronic combined systolic and diastolic CHF, NYHA class 3 ([place]); Anxiety state, unspecified; Routine general medical examination at a health care facility; Exertional dyspnea; Incisional hernia, without obstruction or gangrene;  Incisional hernia; Other fatigue; Back pain, chronic; Generalized osteoarthrosis, involving multiple sites; Type 2 diabetes mellitus with circulatory disorder, without long-term current use of insulin ([place]); Chronic pain disorder; Long term current use of anticoagulant; and Vitamin D deficiency on their problem list.   Past Medical History:  Diagnosis Date  . AICD (automatic cardioverter/defibrillator) present 01/2013   Biventricular cardiac pacemaker in situ   . Allergic rhinitis   . CAD (coronary artery disease)    a. 2004: s/p MI in [place]. No PCI->Medical RX;  b. 07/2012 Cath: LM 30-40, LAD 70p, 70/88m D1 80-90p, OM1 small 90p, OM2 large 80-90p, 576m70-80d, RCA 20-30 diff, EF 40%, glob HK.s/p CABG  [facility]   Patient reports left breast cancer diagnosis in 2002 treated with bilateral mastectomy positive lymph nodes with left axillary dissection followed by chemotherapy of unknown type  . Cataract   . Diabetes mellitus type II   . Exertional shortness of breath   . Hyperlipidemia   . Hypertension   . Ischemic cardiomyopathy    a. 07/2012 Echo: EF 35%, Sev inferoseptal HK, mildly dil LA, Peak PASP 5930m.  . LMarland KitchenBB (left bundle branch block)    a. intermittent - present during rapid afib 07/2012.  . N[place]land Kitchenuromuscular disorder ([facility]  Patient reports chronic numbness in the right foot related to previous surgery on the right leg and "nerve damage"  . PAF (paroxysmal atrial  fibrillation) ([place])    a. 07/2012: Amio and xarelto initiated.  . SBO (small bowel obstruction) ([facility])     Family History  Problem Relation Age of Onset  . Kidney failure Mother   . Heart disease Mother        Died mid 88T complications of diabetes  . Colon cancer Sister   . Arthritis Other   . Cancer Other        ovarian  . Diabetes Other   . Hyperlipidemia Other   . Liver cancer Brother   . Stomach cancer Neg Hx   . Rectal cancer Neg Hx   . Esophageal cancer Neg Hx     Social  History   Socioeconomic History  . Marital status: Married    Spouse name: Not on file  . Number of children: 3  . Years of education: Not on file  . Highest education level: Not on file  Occupational History  . Occupation: Marketing executive work    Fish farm manager: [employer]. [employer]  Social Needs  . Financial resource strain: Not on file  . Food insecurity:    Worry: Not on file    Inability: Not on file  . Transportation needs:    Medical: Not on file    Non-medical: Not on file  Tobacco Use  . Smoking status: Former Smoker    Packs/day: 1.00    Years: 10.00    Pack years: 10.00    Types: Cigarettes    Last attempt to quit: 03/11/2012    Years since quitting: 5.9  . Smokeless tobacco: Never Used  Substance and Sexual Activity  . Alcohol use: No  . Drug use: No  . Sexual activity: Yes    Partners: Female    Birth control/protection: Surgical  Lifestyle  . Physical activity:    Days per week: Not on file    Minutes per session: Not on file  . Stress: Not on file  Relationships  . Social connections:    Talks on phone: Not on file    Gets together: Not on file    Attends religious service: Not on file    Active member of club or organization: Not on file    Attends meetings of clubs or organizations: Not on file    Relationship status: Not on file  Other Topics Concern  . Not on file  Social History Narrative   Lives with husband and mother in law.     Regular exercise: walking   Caffeine use: 2 cups of coffee in the morning    Outpatient Medications Prior to Visit  Medication Sig Dispense Refill  . BAYER MICROLET LANCETS lancets Use as instructed 100 each 2  . Blood Glucose Monitoring Suppl (BAYER CONTOUR NEXT LINK) w/Device KIT Use to check blood sugar 2 times per day. 1 kit 2  . canagliflozin (INVOKANA) 100 MG TABS tablet Take 1 tablet (100 mg total) by mouth every morning. 30 tablet 11  . carvedilol (COREG) 25 MG tablet TAKE 0.5 TABLETS (12.5 MG TOTAL) BY MOUTH 2 (TWO)  TIMES DAILY.   -- NEW LOWER DOSE!!! (Patient taking differently: Take 25 mg by mouth 2 (two) times daily with a meal. ) 100 tablet 4  . Dulaglutide (TRULICITY) 1.5 GP/4.9IY SOPN INJECT 1.5 MG INTO THE SKIN ONCE A WEEK.   NEED FOLLOW UP FOR FUTURE REFILLS 4 pen 11  . Evolocumab (REPATHA SURECLICK) 641 MG/ML SOAJ Inject 140 mg into the skin every 14 (fourteen) days. 2 pen 11  .  fluocinonide ointment (LIDEX) 7.07 % Apply 1 application topically 2 (two) times daily. 30 g 1  . furosemide (LASIX) 20 MG tablet Take 1 tablet (20 mg total) by mouth daily. 90 tablet 3  . glipiZIDE (GLUCOTROL) 5 MG tablet TAKE ONE TABLET BY MOUTH TWICE A DAY BEFORE A MEAL (Patient taking differently: Take 5 mg by mouth 2 (two) times daily before a meal. TAKE ONE TABLET BY MOUTH TWICE A DAY BEFORE A MEAL) 200 tablet 4  . glucose blood (CONTOUR NEXT TEST) test strip Use as instructed to check sugar 2 times daily 200 each 5  . losartan (COZAAR) 25 MG tablet Take 1 tablet (25 mg total) by mouth 2 (two) times daily. 180 tablet 4  . metFORMIN (GLUCOPHAGE) 1000 MG tablet TAKE ONE TABLET BY MOUTH TWICE A DAY WITH FOOD (Patient taking differently: Take 1,000 mg by mouth 2 (two) times daily with a meal. TAKE ONE TABLET BY MOUTH TWICE A DAY WITH FOOD) 200 tablet 4  . nitroGLYCERIN (NITROSTAT) 0.4 MG SL tablet Place 1 tablet (0.4 mg total) under the tongue every 5 (five) minutes as needed for chest pain. 25 tablet 3  . rivaroxaban (XARELTO) 20 MG TABS tablet Take 1 tablet (20 mg total) by mouth daily with supper. 90 tablet 1  . doxycycline (VIBRA-TABS) 100 MG tablet Take 1 tablet (100 mg total) by mouth 2 (two) times daily for 7 days. (Patient not taking: Reported on 02/16/2018) 14 tablet 0   No facility-administered medications prior to visit.      EXAM:  BP 112/64 (BP Location: Right Arm, Patient Position: Sitting, Cuff Size: Normal)   Pulse 92   Temp 97.8 F (36.6 C) (Oral)   Wt 150 lb 12.8 oz (68.4 kg)   SpO2 97%   BMI 25.09  kg/m   Body mass index is 25.09 kg/m.  GENERAL: vitals reviewed and listed above, alert, oriented, appears well hydrated and in no acute distress her with  A child  And well appearing   HEENT: atraumatic, conjunctiva  clear, no obvious abnormalities on inspection of external nose and ears tm clear OP : no lesion edema or exudate  NECK: no obvious masses on inspection palpation  LUNGS: clear to auscultation bilaterally,  Chest scar noted ? Rare wheeze r  lower CV: HRRR, no clubbing cyanosis or  peripheral edema nl cap refill  MS: moves all extremities without noticeable focal  abnormality PSYCH: pleasant and cooperative, no obvious depression or anxiety Lab Results  Component Value Date   WBC 5.3 01/02/2018   HGB 13.2 01/02/2018   HCT 39.1 01/02/2018   PLT 227 01/02/2018   GLUCOSE 228 (H) 01/02/2018   CHOL 116 06/26/2017   TRIG 151 (H) 06/26/2017   HDL 61 06/26/2017   LDLDIRECT 50 04/04/2016   LDLCALC 25 06/26/2017   ALT 16 10/04/2016   AST 13 10/04/2016   NA 140 01/02/2018   K 4.4 01/02/2018   CL 96 01/02/2018   CREATININE 0.70 01/02/2018   BUN 15 01/02/2018   CO2 24 01/02/2018   TSH 1.50 07/01/2017   INR 1.09 05/25/2015   HGBA1C 7.9 (H) 11/08/2017   MICROALBUR <0.7 07/01/2017   BP Readings from Last 3 Encounters:  02/16/18 112/64  02/09/18 110/70  01/13/18 102/62   Wt Readings from Last 3 Encounters:  02/16/18 150 lb 12.8 oz (68.4 kg)  02/09/18 148 lb 11.2 oz (67.4 kg)  01/13/18 152 lb (68.9 kg)  x ray shows hyperinflation and no other sig  acute finding   ASSESSMENT AND PLAN:  Discussed the following assessment and plan:  Cough - Plan: DG Chest 2 View, DG Chest 2 View  Shortness of breath - Plan: DG Chest 2 View, DG Chest 2 View  BREAST CANCER, HX OF - Plan: DG Chest 2 View, DG Chest 2 View  Coronary artery disease involving coronary bypass graft of native heart without angina pectoris  Biventricular cardiac pacemaker - St Jude, Nov 2014  Type 2  diabetes mellitus with other circulatory complication, without long-term current use of insulin ([facility])  S/P CABG x 4: 09/28/12 (LIMA-LAD, SVG-OM1-OM2, SVG-Intermediate)  Ischemic cardiomyopathy Poss copd    But no recent  pfts and eval   Advise  Try ventolin for now and see pulmonary but  Exam and  X ray reassuring  No evidence of  chf as cause. -Patient advised to return or notify health care team  if  new concerns arise.  Patient Instructions  X ray is reassuring and more consistent with COPD  Like an asthmatic  Reaction .   Try inhaler  As directed  Make appt with pulmonary .     consideration of other inhalers and meds  No evidence of hear problem causing this .  Trying to avoid  Prednisone cause if  Increases your blood sugar.     Standley Brooking. Panosh M.D.

## 2018-02-20 DIAGNOSIS — M25531 Pain in right wrist: Secondary | ICD-10-CM | POA: Diagnosis not present

## 2018-02-25 ENCOUNTER — Ambulatory Visit: Payer: BLUE CROSS/BLUE SHIELD | Admitting: Internal Medicine

## 2018-02-25 NOTE — Progress Notes (Deleted)
Patient ID: [name], female   DOB: 03/01/1951, 66 y.o.   MRN: [id number]  HPI: [name] is a 66 y.o.-year-old female, returning for f/u for DM2, dx 2002, insulin-independent, uncontrolled, with complications (CAD, s/p CABG) and vitamin D deficiency. Last visit 3.5 months ago.  At last visit, she just had a wrist fracture and then surgery 10/2017 and her forearm are still in a cast.  She was not exercising and sugars were higher.  The fracture has now healed.  DM2: Last hemoglobin A1c was: Lab Results  Component Value Date   HGBA1C 7.9 (H) 11/08/2017   HGBA1C 7.3 04/29/2017   HGBA1C 6.9 10/04/2016  Patient has been on insulin before >> hypoglycemia.   Pt is on a regimen of: - Metformin 2000 mg with dinner - Invokana 100 mg before breakfast - Trulicity 1.5 mg daily weekly - Glipizide 5 mg before breakfast and dinner Could not afford Januvia 100 mg daily >> 100$ reduced from 348%. She was on Amaryl 2 mg bid. She was previously on Bydureon.  She restarted checking sugars 0-2x a day: - am:90s, 178 >> 150-208 >> 140-155 - after b'fast; >> 155-199, 235 >> n/c - before lunch:  70-80s >> 81-150, 171 >> n/c - after lunch: 101-106 >> n/c >> 165-209 >> n/c - before dinner:  100-115 >> 119, 131-187 >> n/c >> 99 - after dinner:  128-197 >> 89 - bedtime:  n/c >> 179, 205, 260 >> n/c Lowest sugar was 81 >> 89 >> ***; she has hypoglycemia awareness in the 60s. Highest sugar was 200s >> 155 >> ***.  -No CKD, last BUN/creatinine: Lab Results  Component Value Date   BUN 15 01/02/2018   CREATININE 0.70 01/02/2018  On losartan 25 mg twice daily. -+ HL; last set of lipids: Lab Results  Component Value Date   CHOL 116 06/26/2017   HDL 61 06/26/2017   LDLCALC 25 06/26/2017   LDLDIRECT 50 04/04/2016   TRIG 151 (H) 06/26/2017   CHOLHDL 1.9 06/26/2017  She started Repatha in 10/2015, then had to come off due to insurance coverage and now is back on the medication.  She has  intolerance to statins; she is seeing Dr. Croitoru. - last eye exam was 06/2016: No DR. Has cataracts - had surgery in 10/2015.  She has a history of laser surgery. -No numbness and tingling in her feet. On ASA 81.  Vitamin D deficiency:  Latest vitamin D level was slightly low: Lab Results  Component Value Date   VD25OH 26.08 (L) 10/04/2016   VD25OH 38.79 02/22/2016   VD25OH 12.35 (L) 10/10/2015   She continues on vitamin D 5000 units daily.  I ordered the vitamin D at last visit but she did not stop at the lab.  She has a h/o BrCA  - s/p B mastectomy.  ROS: Constitutional: no weight gain/no weight loss, no fatigue, no subjective hyperthermia, no subjective hypothermia Eyes: no blurry vision, no xerophthalmia ENT: no sore throat, no nodules palpated in neck, no dysphagia, no odynophagia, no hoarseness Cardiovascular: no CP/no SOB/no palpitations/no leg swelling Respiratory: no cough/no SOB/no wheezing Gastrointestinal: no N/no V/no D/no C/no acid reflux Musculoskeletal: no muscle aches/no joint aches Skin: no rashes, no hair loss Neurological: no tremors/no numbness/no tingling/no dizziness  I reviewed pt's medications, allergies, PMH, social hx, family hx, and changes were documented in the history of present illness. Otherwise, unchanged from my initial visit note.   Past Medical History:  Diagnosis Date  . AICD (automatic   Patient ID: [name], female   DOB: 12/14/1950, 66 y.o.   MRN: [id number]  HPI: [name] is a 66 y.o.-year-old female, returning for f/u for DM2, dx 2002, insulin-independent, uncontrolled, with complications (CAD, s/p CABG) and vitamin D deficiency. Last visit 3.5 months ago.  At last visit, she just had a wrist fracture and then surgery 10/2017 and her forearm are still in a cast.  She was not exercising and sugars were higher.  The fracture has now healed.  DM2: Last hemoglobin A1c was: Lab Results  Component Value Date   HGBA1C 7.9 (H) 11/08/2017   HGBA1C 7.3 04/29/2017   HGBA1C 6.9 10/04/2016  Patient has been on insulin before >> hypoglycemia.   Pt is on a regimen of: - Metformin 2000 mg with dinner - Invokana 100 mg before breakfast - Trulicity 1.5 mg daily weekly - Glipizide 5 mg before breakfast and dinner Could not afford Januvia 100 mg daily >> 100$ reduced from 348%. She was on Amaryl 2 mg bid. She was previously on Bydureon.  She restarted checking sugars 0-2x a day: - am:90s, 178 >> 150-208 >> 140-155 - after b'fast; >> 155-199, 235 >> n/c - before lunch:  70-80s >> 81-150, 171 >> n/c - after lunch: 101-106 >> n/c >> 165-209 >> n/c - before dinner:  100-115 >> 119, 131-187 >> n/c >> 99 - after dinner:  128-197 >> 89 - bedtime:  n/c >> 179, 205, 260 >> n/c Lowest sugar was 81 >> 89 >> ***; she has hypoglycemia awareness in the 60s. Highest sugar was 200s >> 155 >> ***.  -No CKD, last BUN/creatinine: Lab Results  Component Value Date   BUN 15 01/02/2018   CREATININE 0.70 01/02/2018  On losartan 25 mg twice daily. -+ HL; last set of lipids: Lab Results  Component Value Date   CHOL 116 06/26/2017   HDL 61 06/26/2017   LDLCALC 25 06/26/2017   LDLDIRECT 50 04/04/2016   TRIG 151 (H) 06/26/2017   CHOLHDL 1.9 06/26/2017  She started Repatha in 10/2015, then had to come off due to insurance coverage and now is back on the medication.  She has  intolerance to statins; she is seeing Dr. Croitoru. - last eye exam was 06/2016: No DR. Has cataracts - had surgery in 10/2015.  She has a history of laser surgery. -No numbness and tingling in her feet. On ASA 81.  Vitamin D deficiency:  Latest vitamin D level was slightly low: Lab Results  Component Value Date   VD25OH 26.08 (L) 10/04/2016   VD25OH 38.79 02/22/2016   VD25OH 12.35 (L) 10/10/2015   She continues on vitamin D 5000 units daily.  I ordered the vitamin D at last visit but she did not stop at the lab.  She has a h/o BrCA  - s/p B mastectomy.  ROS: Constitutional: no weight gain/no weight loss, no fatigue, no subjective hyperthermia, no subjective hypothermia Eyes: no blurry vision, no xerophthalmia ENT: no sore throat, no nodules palpated in neck, no dysphagia, no odynophagia, no hoarseness Cardiovascular: no CP/no SOB/no palpitations/no leg swelling Respiratory: no cough/no SOB/no wheezing Gastrointestinal: no N/no V/no D/no C/no acid reflux Musculoskeletal: no muscle aches/no joint aches Skin: no rashes, no hair loss Neurological: no tremors/no numbness/no tingling/no dizziness  I reviewed pt's medications, allergies, PMH, social hx, family hx, and changes were documented in the history of present illness. Otherwise, unchanged from my initial visit note.   Past Medical History:  Diagnosis Date  . AICD (automatic   Patient ID: [name], female   DOB: 12/14/1950, 66 y.o.   MRN: [id number]  HPI: [name] is a 66 y.o.-year-old female, returning for f/u for DM2, dx 2002, insulin-independent, uncontrolled, with complications (CAD, s/p CABG) and vitamin D deficiency. Last visit 3.5 months ago.  At last visit, she just had a wrist fracture and then surgery 10/2017 and her forearm are still in a cast.  She was not exercising and sugars were higher.  The fracture has now healed.  DM2: Last hemoglobin A1c was: Lab Results  Component Value Date   HGBA1C 7.9 (H) 11/08/2017   HGBA1C 7.3 04/29/2017   HGBA1C 6.9 10/04/2016  Patient has been on insulin before >> hypoglycemia.   Pt is on a regimen of: - Metformin 2000 mg with dinner - Invokana 100 mg before breakfast - Trulicity 1.5 mg daily weekly - Glipizide 5 mg before breakfast and dinner Could not afford Januvia 100 mg daily >> 100$ reduced from 348%. She was on Amaryl 2 mg bid. She was previously on Bydureon.  She restarted checking sugars 0-2x a day: - am:90s, 178 >> 150-208 >> 140-155 - after b'fast; >> 155-199, 235 >> n/c - before lunch:  70-80s >> 81-150, 171 >> n/c - after lunch: 101-106 >> n/c >> 165-209 >> n/c - before dinner:  100-115 >> 119, 131-187 >> n/c >> 99 - after dinner:  128-197 >> 89 - bedtime:  n/c >> 179, 205, 260 >> n/c Lowest sugar was 81 >> 89 >> ***; she has hypoglycemia awareness in the 60s. Highest sugar was 200s >> 155 >> ***.  -No CKD, last BUN/creatinine: Lab Results  Component Value Date   BUN 15 01/02/2018   CREATININE 0.70 01/02/2018  On losartan 25 mg twice daily. -+ HL; last set of lipids: Lab Results  Component Value Date   CHOL 116 06/26/2017   HDL 61 06/26/2017   LDLCALC 25 06/26/2017   LDLDIRECT 50 04/04/2016   TRIG 151 (H) 06/26/2017   CHOLHDL 1.9 06/26/2017  She started Repatha in 10/2015, then had to come off due to insurance coverage and now is back on the medication.  She has  intolerance to statins; she is seeing Dr. Croitoru. - last eye exam was 06/2016: No DR. Has cataracts - had surgery in 10/2015.  She has a history of laser surgery. -No numbness and tingling in her feet. On ASA 81.  Vitamin D deficiency:  Latest vitamin D level was slightly low: Lab Results  Component Value Date   VD25OH 26.08 (L) 10/04/2016   VD25OH 38.79 02/22/2016   VD25OH 12.35 (L) 10/10/2015   She continues on vitamin D 5000 units daily.  I ordered the vitamin D at last visit but she did not stop at the lab.  She has a h/o BrCA  - s/p B mastectomy.  ROS: Constitutional: no weight gain/no weight loss, no fatigue, no subjective hyperthermia, no subjective hypothermia Eyes: no blurry vision, no xerophthalmia ENT: no sore throat, no nodules palpated in neck, no dysphagia, no odynophagia, no hoarseness Cardiovascular: no CP/no SOB/no palpitations/no leg swelling Respiratory: no cough/no SOB/no wheezing Gastrointestinal: no N/no V/no D/no C/no acid reflux Musculoskeletal: no muscle aches/no joint aches Skin: no rashes, no hair loss Neurological: no tremors/no numbness/no tingling/no dizziness  I reviewed pt's medications, allergies, PMH, social hx, family hx, and changes were documented in the history of present illness. Otherwise, unchanged from my initial visit note.   Past Medical History:  Diagnosis Date  . AICD (automatic   Patient ID: [name], female   DOB: 03/01/1951, 66 y.o.   MRN: [id number]  HPI: [name] is a 66 y.o.-year-old female, returning for f/u for DM2, dx 2002, insulin-independent, uncontrolled, with complications (CAD, s/p CABG) and vitamin D deficiency. Last visit 3.5 months ago.  At last visit, she just had a wrist fracture and then surgery 10/2017 and her forearm are still in a cast.  She was not exercising and sugars were higher.  The fracture has now healed.  DM2: Last hemoglobin A1c was: Lab Results  Component Value Date   HGBA1C 7.9 (H) 11/08/2017   HGBA1C 7.3 04/29/2017   HGBA1C 6.9 10/04/2016  Patient has been on insulin before >> hypoglycemia.   Pt is on a regimen of: - Metformin 2000 mg with dinner - Invokana 100 mg before breakfast - Trulicity 1.5 mg daily weekly - Glipizide 5 mg before breakfast and dinner Could not afford Januvia 100 mg daily >> 100$ reduced from 348%. She was on Amaryl 2 mg bid. She was previously on Bydureon.  She restarted checking sugars 0-2x a day: - am:90s, 178 >> 150-208 >> 140-155 - after b'fast; >> 155-199, 235 >> n/c - before lunch:  70-80s >> 81-150, 171 >> n/c - after lunch: 101-106 >> n/c >> 165-209 >> n/c - before dinner:  100-115 >> 119, 131-187 >> n/c >> 99 - after dinner:  128-197 >> 89 - bedtime:  n/c >> 179, 205, 260 >> n/c Lowest sugar was 81 >> 89 >> ***; she has hypoglycemia awareness in the 60s. Highest sugar was 200s >> 155 >> ***.  -No CKD, last BUN/creatinine: Lab Results  Component Value Date   BUN 15 01/02/2018   CREATININE 0.70 01/02/2018  On losartan 25 mg twice daily. -+ HL; last set of lipids: Lab Results  Component Value Date   CHOL 116 06/26/2017   HDL 61 06/26/2017   LDLCALC 25 06/26/2017   LDLDIRECT 50 04/04/2016   TRIG 151 (H) 06/26/2017   CHOLHDL 1.9 06/26/2017  She started Repatha in 10/2015, then had to come off due to insurance coverage and now is back on the medication.  She has  intolerance to statins; she is seeing Dr. Croitoru. - last eye exam was 06/2016: No DR. Has cataracts - had surgery in 10/2015.  She has a history of laser surgery. -No numbness and tingling in her feet. On ASA 81.  Vitamin D deficiency:  Latest vitamin D level was slightly low: Lab Results  Component Value Date   VD25OH 26.08 (L) 10/04/2016   VD25OH 38.79 02/22/2016   VD25OH 12.35 (L) 10/10/2015   She continues on vitamin D 5000 units daily.  I ordered the vitamin D at last visit but she did not stop at the lab.  She has a h/o BrCA  - s/p B mastectomy.  ROS: Constitutional: no weight gain/no weight loss, no fatigue, no subjective hyperthermia, no subjective hypothermia Eyes: no blurry vision, no xerophthalmia ENT: no sore throat, no nodules palpated in neck, no dysphagia, no odynophagia, no hoarseness Cardiovascular: no CP/no SOB/no palpitations/no leg swelling Respiratory: no cough/no SOB/no wheezing Gastrointestinal: no N/no V/no D/no C/no acid reflux Musculoskeletal: no muscle aches/no joint aches Skin: no rashes, no hair loss Neurological: no tremors/no numbness/no tingling/no dizziness  I reviewed pt's medications, allergies, PMH, social hx, family hx, and changes were documented in the history of present illness. Otherwise, unchanged from my initial visit note.   Past Medical History:  Diagnosis Date  . AICD (automatic   Patient ID: [name], female   DOB: Apr 26, 1950, 67 y.o.   MRN: [id number]  HPI: [name] is a 67 y.o.-year-old female, returning for f/u for DM2, dx 2002, insulin-independent, uncontrolled, with complications (CAD, s/p CABG) and vitamin D deficiency. Last visit 3.5 months ago.  At last visit, she just had a wrist fracture and then surgery 10/2017 and her forearm are still in a cast.  She was not exercising and sugars were higher.  The fracture has now healed.  DM2: Last hemoglobin A1c was: Lab Results  Component Value Date   HGBA1C 7.9 (H) 11/08/2017   HGBA1C 7.3 04/29/2017   HGBA1C 6.9 10/04/2016  Patient has been on insulin before >> hypoglycemia.   Pt is on a regimen of: - Metformin 2000 mg with dinner - Invokana 100 mg before breakfast - Trulicity 1.5 mg daily weekly - Glipizide 5 mg before breakfast and dinner Could not afford Januvia 100 mg daily >> 100$ reduced from 348%. She was on Amaryl 2 mg bid. She was previously on Bydureon.  She restarted checking sugars 0-2x a day: - am:90s, 178 >> 150-208 >> 140-155 - after b'fast; >> 155-199, 235 >> n/c - before lunch:  70-80s >> 81-150, 171 >> n/c - after lunch: 101-106 >> n/c >> 165-209 >> n/c - before dinner:  100-115 >> 119, 131-187 >> n/c >> 99 - after dinner:  128-197 >> 89 - bedtime:  n/c >> 179, 205, 260 >> n/c Lowest sugar was 81 >> 89 >> ***; she has hypoglycemia awareness in the 60s. Highest sugar was 200s >> 155 >> ***.  -No CKD, last BUN/creatinine: Lab Results  Component Value Date   BUN 15 01/02/2018   CREATININE 0.70 01/02/2018  On losartan 25 mg twice daily. -+ HL; last set of lipids: Lab Results  Component Value Date   CHOL 116 06/26/2017   HDL 61 06/26/2017   LDLCALC 25 06/26/2017   LDLDIRECT 50 04/04/2016   TRIG 151 (H) 06/26/2017   CHOLHDL 1.9 06/26/2017  She started Repatha in 10/2015, then had to come off due to insurance coverage and now is back on the medication.  She has  intolerance to statins; she is seeing Dr. Sallyanne Kuster. - last eye exam was 06/2016: No DR. Has cataracts - had surgery in 10/2015.  She has a history of laser surgery. -No numbness and tingling in her feet. On ASA 81.  Vitamin D deficiency:  Latest vitamin D level was slightly low: Lab Results  Component Value Date   VD25OH 26.08 (L) 10/04/2016   VD25OH 38.79 02/22/2016   VD25OH 12.35 (L) 10/10/2015   She continues on vitamin D 5000 units daily.  I ordered the vitamin D at last visit but she did not stop at the lab.  She has a h/o BrCA  - s/p B mastectomy.  ROS: Constitutional: no weight gain/no weight loss, no fatigue, no subjective hyperthermia, no subjective hypothermia Eyes: no blurry vision, no xerophthalmia ENT: no sore throat, no nodules palpated in neck, no dysphagia, no odynophagia, no hoarseness Cardiovascular: no CP/no SOB/no palpitations/no leg swelling Respiratory: no cough/no SOB/no wheezing Gastrointestinal: no N/no V/no D/no C/no acid reflux Musculoskeletal: no muscle aches/no joint aches Skin: no rashes, no hair loss Neurological: no tremors/no numbness/no tingling/no dizziness  I reviewed pt's medications, allergies, PMH, social hx, family hx, and changes were documented in the history of present illness. Otherwise, unchanged from my initial visit note.   Past Medical History:  Diagnosis Date  . AICD (automatic

## 2018-02-26 ENCOUNTER — Encounter: Payer: Self-pay | Admitting: Internal Medicine

## 2018-02-26 ENCOUNTER — Ambulatory Visit: Payer: BLUE CROSS/BLUE SHIELD | Admitting: Internal Medicine

## 2018-02-26 VITALS — BP 98/64 | HR 91 | Temp 98.0°F | Wt 147.1 lb

## 2018-02-26 DIAGNOSIS — Z13820 Encounter for screening for osteoporosis: Secondary | ICD-10-CM

## 2018-02-26 DIAGNOSIS — Z780 Asymptomatic menopausal state: Secondary | ICD-10-CM

## 2018-02-26 DIAGNOSIS — R0602 Shortness of breath: Secondary | ICD-10-CM

## 2018-02-26 DIAGNOSIS — E1159 Type 2 diabetes mellitus with other circulatory complications: Secondary | ICD-10-CM

## 2018-02-26 HISTORY — DX: Shortness of breath: R06.02

## 2018-02-26 LAB — POCT GLYCOSYLATED HEMOGLOBIN (HGB A1C): Hemoglobin A1C: 6.4 % — AB (ref 4.0–5.6)

## 2018-02-26 NOTE — Progress Notes (Signed)
Established Patient Office Visit     CC/Reason for Visit: Establish care, evaluation of shortness of breath  HPI: [name] is a 67 y.o. female who is coming in today for the above mentioned reasons.  She is due for her initial Medicare wellness visit.  [name] presented with an MI in 2004.  In May of 2014 she presented to the hospital with congestive heart failure and atrial fibrillation in the setting of three-vessel coronary artery disease leading to quadruple bypass surgery.  She had persistently depressed left ventricular systolic function and had a dual-chamber biventricular pacemaker.  Her ejection fraction subsequently recovered.  Her most recent 2D echo in February 2016 shows an ejection fraction of 50 to 55%.  She comes in today for evaluation of shortness of breath.  Since November 13 she has been having a cough.  Has been seen twice in this office for the same.  First time was treated as an upper respiratory infection, second time because of persistence of symptoms a chest x-ray was done that was negative for acute changes.  She comes in today complaining of increased shortness of breath with exertion and at rest.  She has been living in a new house since the summer that has 15 steps and she has noticed that she is having increasing difficulty going up and down the steps.  She also notes extreme fatigue saying "I feel like an 67 year old; I am exhausted at the end of the day".  She denies chest pain, palpitations, dizziness.  Of note, since last visit with her endocrinologist at which time her A1c had increased to 7.9, she has been doing better with eating choices and tells me she has lost about 5 pounds over the past 3 months.  Late this summer she suffered a wrist fracture and underwent operative repair by Dr. Amedeo Plenty, has recovered well from this.   Past Medical/Surgical History: Past Medical History:  Diagnosis Date  . AICD (automatic cardioverter/defibrillator) present  01/2013   Biventricular cardiac pacemaker in situ   . Allergic rhinitis   . CAD (coronary artery disease)    a. 2004: s/p MI in [place]. No PCI->Medical RX;  b. 07/2012 Cath: LM 30-40, LAD 70p, 70/64m D1 80-90p, OM1 small 90p, OM2 large 80-90p, 543m70-80d, RCA 20-30 diff, EF 40%, glob HK.s/p CABG  . [facility]   Patient reports left breast cancer diagnosis in 2002 treated with bilateral mastectomy positive lymph nodes with left axillary dissection followed by chemotherapy of unknown type  . Cataract   . Diabetes mellitus type II   . Exertional shortness of breath   . Hyperlipidemia   . Hypertension   . Ischemic cardiomyopathy    a. 07/2012 Echo: EF 35%, Sev inferoseptal HK, mildly dil LA, Peak PASP 5968m.  . LMarland KitchenBB (left bundle branch block)    a. intermittent - present during rapid afib 07/2012.  . NMarland Kitchenuromuscular disorder ([facility]  Patient reports chronic numbness in the right foot related to previous surgery on the right leg and "nerve damage"  . PAF (paroxysmal atrial fibrillation) ([facility]  a. 07/2012: Amio and xarelto initiated.  . SBO (small bowel obstruction) ([facility]   Past Surgical History:  Procedure Laterality Date  . ABDOMINAL HYSTERECTOMY  2000  . APPENDECTOMY  1974  . BI-VENTRICULAR PACEMAKER INSERTION N/A 01/25/2013   Procedure: BI-VENTRICULAR PACEMAKER INSERTION (CRT-P);  Surgeon: GreEvans LanceD;  [facility];  Service:  Cardiovascular;  Laterality: N/A;  . BREAST BIOPSY Left 2002  . CARDIAC CATHETERIZATION    . CHOLECYSTECTOMY OPEN  1974  . CORONARY ARTERY BYPASS GRAFT N/A 09/28/2012   Procedure: CORONARY ARTERY BYPASS GRAFTING (CABG);  Surgeon: [name], MD;  Location: [place];  Service: Open Heart Surgery;  Laterality: N/A;  CABG x four, using left internal mammary artery and left leg greater saphenous vein harvested endoscopically  . DILATION AND CURETTAGE OF UTERUS  1983  . EPICARDIAL PACING LEAD PLACEMENT N/A 09/28/2012    Procedure: EPICARDIAL PACING LEAD PLACEMENT;  Surgeon: [name], MD;  Location: [place];  Service: Thoracic;  Laterality: N/A;  LV LEAD PLACEMENT  . HERNIA REPAIR    . INCISIONAL HERNIA REPAIR N/A 05/25/2015   Procedure: LAPAROSCOPIC INCISIONAL HERNIA WITH MESH ;  Surgeon: [name], MD;  Location: [place];  Service: General;  Laterality: N/A;  . INCONTINENCE SURGERY  2000  . INSERTION OF MESH N/A 05/25/2015   Procedure: INSERTION OF MESH;  Surgeon: [name], MD;  Location: [place];  Service: General;  Laterality: N/A;  . INTRAOPERATIVE TRANSESOPHAGEAL ECHOCARDIOGRAM N/A 09/28/2012   Procedure: INTRAOPERATIVE TRANSESOPHAGEAL ECHOCARDIOGRAM;  Surgeon: [name], MD;  Location: [place];  Service: Open Heart Surgery;  Laterality: N/A;  . LAPAROSCOPIC INCISIONAL / UMBILICAL / VENTRAL HERNIA REPAIR  05/25/2015   IHR  . LEFT HEART CATHETERIZATION WITH CORONARY ANGIOGRAM N/A 07/20/2012   Procedure: LEFT HEART CATHETERIZATION WITH CORONARY ANGIOGRAM;  Surgeon: [name], MD;  Location[facility] C[facility];  Service: Cardiovascular;  Laterality: N/A;  . MASTECTOMY Right 2002  . MASTECTOMY MODIFIED RADICAL W/ AXILLARY LYMPH NODES W/ OR W/O PECTORALIS MINOR Left 2002  . MAZE N/A 09/28/2012   Procedure: MAZE;  Surgeon: [name], MD;  Location: [place];  Service: Open Heart Surgery;  Laterality: N/A;  . ORIF WRIST FRACTURE Right 11/08/2017   Procedure: OPEN REDUCTION INTERNAL FIXATION (ORIF) WRIST FRACTURE;  Surgeon: [name], MD;  Location: [place];  Service: Orthopedics;  Laterality: Right;  90 mins  . TENDON REPAIR Right 2001 X 3-4   torn ligaments and tendons in ankle up to knee from work related accident  . TUBAL LIGATION  1987    Social History:  reports that she quit smoking about 5 years ago. Her smoking use included cigarettes. She has a 10.00 pack-year smoking history. She has never used smokeless tobacco. She reports that she does not drink alcohol or use  drugs.  Allergies: Allergies  Allergen Reactions  . Statins Other (See Comments)    Myalgias with rosuvastatin, atorvastatin and simvastatin     Family History:  Family History  Problem Relation Age of Onset  . Kidney failure Mother   . Heart disease Mother        Died mid 62B complications of diabetes  . Colon cancer Sister   . Arthritis Other   . Cancer Other        ovarian  . Diabetes Other   . Hyperlipidemia Other   . Liver cancer Brother   . Stomach cancer Neg Hx   . Rectal cancer Neg Hx   . Esophageal cancer Neg Hx      Current Outpatient Medications:  .  albuterol (PROVENTIL HFA;VENTOLIN HFA) 108 (90 Base) MCG/ACT inhaler, Inhale 2 puffs into the lungs every 6 (six) hours as needed for wheezing or shortness of breath., Disp: 1 Inhaler, Rfl: 0 .  BAYER MICROLET LANCETS lancets, Use as instructed, Disp:  100 each, Rfl: 2 .  Blood Glucose Monitoring Suppl (BAYER CONTOUR NEXT LINK) w/Device KIT, Use to check blood sugar 2 times per day., Disp: 1 kit, Rfl: 2 .  canagliflozin (INVOKANA) 100 MG TABS tablet, Take 1 tablet (100 mg total) by mouth every morning., Disp: 30 tablet, Rfl: 11 .  carvedilol (COREG) 25 MG tablet, TAKE 0.5 TABLETS (12.5 MG TOTAL) BY MOUTH 2 (TWO) TIMES DAILY.   -- NEW LOWER DOSE!!! (Patient taking differently: Take 25 mg by mouth 2 (two) times daily with a meal. ), Disp: 100 tablet, Rfl: 4 .  Dulaglutide (TRULICITY) 1.5 TT/0.1XB SOPN, INJECT 1.5 MG INTO THE SKIN ONCE A WEEK.   NEED FOLLOW UP FOR FUTURE REFILLS, Disp: 4 pen, Rfl: 11 .  Evolocumab (REPATHA SURECLICK) 939 MG/ML SOAJ, Inject 140 mg into the skin every 14 (fourteen) days., Disp: 2 pen, Rfl: 11 .  fluocinonide ointment (LIDEX) 0.30 %, Apply 1 application topically 2 (two) times daily., Disp: 30 g, Rfl: 1 .  furosemide (LASIX) 20 MG tablet, Take 1 tablet (20 mg total) by mouth daily., Disp: 90 tablet, Rfl: 3 .  glipiZIDE (GLUCOTROL) 5 MG tablet, TAKE ONE TABLET BY MOUTH TWICE A DAY BEFORE A MEAL  (Patient taking differently: Take 5 mg by mouth 2 (two) times daily before a meal. TAKE ONE TABLET BY MOUTH TWICE A DAY BEFORE A MEAL), Disp: 200 tablet, Rfl: 4 .  glucose blood (CONTOUR NEXT TEST) test strip, Use as instructed to check sugar 2 times daily, Disp: 200 each, Rfl: 5 .  losartan (COZAAR) 25 MG tablet, Take 1 tablet (25 mg total) by mouth 2 (two) times daily., Disp: 180 tablet, Rfl: 4 .  metFORMIN (GLUCOPHAGE) 1000 MG tablet, TAKE ONE TABLET BY MOUTH TWICE A DAY WITH FOOD (Patient taking differently: Take 1,000 mg by mouth 2 (two) times daily with a meal. TAKE ONE TABLET BY MOUTH TWICE A DAY WITH FOOD), Disp: 200 tablet, Rfl: 4 .  nitroGLYCERIN (NITROSTAT) 0.4 MG SL tablet, Place 1 tablet (0.4 mg total) under the tongue every 5 (five) minutes as needed for chest pain., Disp: 25 tablet, Rfl: 3 .  rivaroxaban (XARELTO) 20 MG TABS tablet, Take 1 tablet (20 mg total) by mouth daily with supper., Disp: 90 tablet, Rfl: 1  Review of Systems:  Constitutional: Denies fever, chills, diaphoresis, appetite change and fatigue.  HEENT: Denies photophobia, eye pain, redness, hearing loss, ear pain, congestion, sore throat, rhinorrhea, sneezing, mouth sores, trouble swallowing, neck pain, neck stiffness and tinnitus.   Respiratory: Positive for shortness of breath, dyspnea on exertion, dry cough. Cardiovascular: Denies chest pain, palpitations and leg swelling.  Gastrointestinal: Denies nausea, vomiting, abdominal pain, diarrhea, constipation, blood in stool and abdominal distention.  Genitourinary: Denies dysuria, urgency, frequency, hematuria, flank pain and difficulty urinating.  Endocrine: Denies: hot or cold intolerance, sweats, changes in hair or nails, polyuria, polydipsia. Musculoskeletal: Denies myalgias, back pain, joint swelling, arthralgias and gait problem.  Skin: Denies pallor, rash and wound.  Neurological: Denies dizziness, seizures, syncope, weakness, light-headedness, numbness and  headaches.  Hematological: Denies adenopathy. Easy bruising, personal or family bleeding history  Psychiatric/Behavioral: Denies suicidal ideation, mood changes, confusion, nervousness, sleep disturbance and agitation    Physical Exam: Vitals:   02/26/18 1258  BP: 98/64  Pulse: 91  Temp: 98 F (36.7 C)  TempSrc: Oral  SpO2: 95%  Weight: 147 lb 1.6 oz (66.7 kg)    Body mass index is 24.48 kg/m.   Constitutional: NAD, calm, comfortable Eyes:  PERRL, lids and conjunctivae normal ENMT: Mucous membranes are moist.  Respiratory: clear to auscultation bilaterally, no wheezing, no crackles. Normal respiratory effort. No accessory muscle use.  Cardiovascular: Regular rate and rhythm, no murmurs / rubs / gallops. No extremity edema. 2+ pedal pulses. No carotid bruits.  Abdomen: no tenderness, no masses palpated. No hepatosplenomegaly. Bowel sounds positive.  Musculoskeletal: no clubbing / cyanosis. No joint deformity upper and lower extremities. Good ROM, no contractures. Normal muscle tone.  Skin: no rashes, lesions, ulcers. No induration Neurologic: CN 2-12 grossly intact. Sensation intact, DTR normal. Strength 5/5 in all 4.  Psychiatric: Normal judgment and insight. Alert and oriented x 3. Normal mood.    Impression and Plan:   Screening for osteoporosis - Plan: DG Bone Density  Type 2 diabetes mellitus with other circulatory complication, without long-term current use of insulin (HCC)  -A1c with a significant improvement down to 6.4 today. -Continue current management and follow-up with her endocrinologist, Dr. Cruzita Lederer.  Shortness of breath/DOE/Extreme Fatigue -Very concerning for anginal equivalent in a patient with known coronary artery disease. -She never had typical anginal symptoms with her first MI, in fact presented similarly to this with dyspnea on exertion while doing yard work. -Have obtained EKG today which unfortunately is paced and as such does not provide me with  much information. -Will initiate urgent referral to cardiology for further testing i.e. stress test versus redo cath.  Discussed with Dr. Percival Spanish: They will work her in over the next 1 to 2 days. -If she is cleared by cardiology and continues to have symptoms, we can slowly proceed with further work-up.  I believe next step would be referral to pulmonary given her longstanding smoking history and findings concerning for COPD on recent chest x-ray.    Patient Instructions  -It was nice to meet you today!  -I am quite concerned for possibly heart issues in regards to your trouble breathing and fatigue.  I have discussed with Dr. Percival Spanish who is the doctor on call for Dr. Sallyanne Kuster today.  They will work you in to see you in the next day or 2 in the office.  Please come back and see [place] if you continue to have issues.     Lelon Frohlich, MD [facility]

## 2018-02-26 NOTE — Patient Instructions (Signed)
-  It was nice to meet you today!  -I am quite concerned for possibly heart issues in regards to your trouble breathing and fatigue.  I have discussed with Dr. Percival Spanish who is the doctor on call for Dr. Sallyanne Kuster today.  They will work you in to see you in the next day or 2 in the office.  Please come back and see [place] if you continue to have issues.

## 2018-02-27 ENCOUNTER — Encounter: Payer: Self-pay | Admitting: Internal Medicine

## 2018-02-27 ENCOUNTER — Ambulatory Visit: Payer: BLUE CROSS/BLUE SHIELD | Admitting: Internal Medicine

## 2018-02-27 VITALS — BP 98/62 | HR 87 | Ht 64.0 in | Wt 148.4 lb

## 2018-02-27 DIAGNOSIS — Z950 Presence of cardiac pacemaker: Secondary | ICD-10-CM | POA: Diagnosis not present

## 2018-02-27 DIAGNOSIS — R0602 Shortness of breath: Secondary | ICD-10-CM | POA: Diagnosis not present

## 2018-02-27 DIAGNOSIS — R5383 Other fatigue: Secondary | ICD-10-CM

## 2018-02-27 DIAGNOSIS — Z951 Presence of aortocoronary bypass graft: Secondary | ICD-10-CM | POA: Diagnosis not present

## 2018-02-27 NOTE — Progress Notes (Signed)
OFFICE NOTE  Chief Complaint:  Progressive dyspnea on exertion  Primary Care Physician: Isaac Bliss, Rayford Halsted, MD  HPI:  [name] is a 67 y.o. female with a past medial history significant for progressive dyspnea on exertion.  This is a 67 year old female patient of Dr. Sallyanne Kuster with a history of coronary artery disease and prior CABG.  Around 2016 she had some progressive dyspnea on exertion and was found to have chronotropic incompetence by metabolic stress testing.  Subsequently she underwent CRT placement and has had significant improvement in her symptoms since that time.  Recently she was seen by Dr. Sallyanne Kuster and had a reduction in her Lasix due to being placed on Invokana.  It was however about 2 months after this when she started having a worsening shortness of breath.  She denies ever a history of chest pain in fact did not have any chest pain prior to her MI in the past and bypass.  Pacemaker function appears normal according to [employer].  Her weight has been stable and decreased compared to recent studies and there is no evidence of volume overload.  Her blood pressure is low normal today in the upper 43O which certainly could also be causing her some fatigue or shortness of breath with exertion.  PMHx:  Past Medical History:  Diagnosis Date  . AICD (automatic cardioverter/defibrillator) present 01/2013   Biventricular cardiac pacemaker in situ   . Allergic rhinitis   . CAD (coronary artery disease)    a. 2004: s/p MI in [place]. No PCI->Medical RX;  b. 07/2012 Cath: LM 30-40, LAD 70p, 70/46m D1 80-90p, OM1 small 90p, OM2 large 80-90p, 52m70-80d, RCA 20-30 diff, EF 40%, glob HK.s/p CABG  . [facility]   Patient reports left breast cancer diagnosis in 2002 treated with bilateral mastectomy positive lymph nodes with left axillary dissection followed by chemotherapy of unknown type  . Cataract   . Diabetes mellitus type II   . Exertional shortness of  breath   . Hyperlipidemia   . Hypertension   . Ischemic cardiomyopathy    a. 07/2012 Echo: EF 35%, Sev inferoseptal HK, mildly dil LA, Peak PASP 5925m.  . LMarland KitchenBB (left bundle branch block)    a. intermittent - present during rapid afib 07/2012.  . NMarland Kitchenuromuscular disorder ([facility]  Patient reports chronic numbness in the right foot related to previous surgery on the right leg and "nerve damage"  . [facility]ox[facility]mal atrial fibrillation) [facility]  a. 07/2012: Amio and xarelto initiated.  . SBO (small bowel obstruction) ([facility]   Past Surgical History:  Procedure Laterality Date  . ABDOMINAL HYSTERECTOMY  2000  . APPENDECTOMY  1974  . BI-VENTRICULAR PACEMAKER INSERTION N/A 01/25/2013   Procedure: BI-VENTRICULAR PACEMAKER INSERTION (CRT-P);  Surgeon: GreEvans LanceD;  [facility];  Service: Cardiovascular;  Laterality: N/A;  . BREAST BIOPSY Left 2002  . CARDIAC CATHETERIZATION    . CHOLECYSTECTOMY OPEN  1974  . CORONARY ARTERY BYPASS GRAFT N/A 09/28/2012   Procedure: CORONARY ARTERY BYPASS GRAFTING (CABG);  Surgeon: EdwGrace IsaacD;  [facility];  Laterality: N/A;  CABG x four, using left internal mammary artery and left leg greater saphenous vein harvested endoscopically  . DILATION AND CURETTAGE OF UTERUS  1983  . EPICARDIAL PACING LEAD PLACEMENT N/A 09/28/2012   Procedure: EPICARDIAL PACING LEAD PLACEMENT;  Surgeon: EdwGrace IsaacD;  [facility] [place]  Service: Thoracic;  Laterality: N/A;  LV LEAD PLACEMENT  . HERNIA REPAIR    . INCISIONAL HERNIA REPAIR N/A 05/25/2015   Procedure: LAPAROSCOPIC INCISIONAL HERNIA WITH MESH ;  Surgeon: [name], MD;  Location: [place];  Service: General;  Laterality: N/A;  . INCONTINENCE SURGERY  2000  . INSERTION OF MESH N/A 05/25/2015   Procedure: INSERTION OF MESH;  Surgeon: [name], MD;  Location: [place];  Service: General;  Laterality: N/A;  . INTRAOPERATIVE TRANSESOPHAGEAL ECHOCARDIOGRAM  N/A 09/28/2012   Procedure: INTRAOPERATIVE TRANSESOPHAGEAL ECHOCARDIOGRAM;  Surgeon: [name], MD;  Location: [place];  Service: Open Heart Surgery;  Laterality: N/A;  . LAPAROSCOPIC INCISIONAL / UMBILICAL / VENTRAL HERNIA REPAIR  05/25/2015   IHR  . LEFT HEART CATHETERIZATION WITH CORONARY ANGIOGRAM N/A 07/20/2012   Procedure: LEFT HEART CATHETERIZATION WITH CORONARY ANGIOGRAM;  Surgeon: [name], MD;  [facility];  Service: Cardiovascular;  Laterality: N/A;  . MASTECTOMY Right 2002  . MASTECTOMY MODIFIED RADICAL W/ AXILLARY LYMPH NODES W/ OR W/O PECTORALIS MINOR Left 2002  . MAZE N/A 09/28/2012   Procedure: MAZE;  Surgeon: [name], MD;  Location: [place];  Service: Open Heart Surgery;  Laterality: N/A;  . ORIF WRIST FRACTURE Right 11/08/2017   Procedure: OPEN REDUCTION INTERNAL FIXATION (ORIF) WRIST FRACTURE;  Surgeon: [name], MD;  Location: [place];  Service: Orthopedics;  Laterality: Right;  90 mins  . TENDON REPAIR Right 2001 X 3-4   torn ligaments and tendons in ankle up to knee from work related accident  . TUBAL LIGATION  1987    FAMHx:  Family History  Problem Relation Age of Onset  . Kidney failure Mother   . Heart disease Mother        Died mid 38H complications of diabetes  . Colon cancer Sister   . Arthritis Other   . Cancer Other        ovarian  . Diabetes Other   . Hyperlipidemia Other   . Liver cancer Brother   . Stomach cancer Neg Hx   . Rectal cancer Neg Hx   . Esophageal cancer Neg Hx     SOCHx:   reports that she quit smoking about 5 years ago. Her smoking use included cigarettes. She has a 10.00 pack-year smoking history. She has never used smokeless tobacco. She reports that she does not drink alcohol or use drugs.  ALLERGIES:  Allergies  Allergen Reactions  . Statins Other (See Comments)    Myalgias with rosuvastatin, atorvastatin and simvastatin     ROS: Pertinent items noted in HPI and remainder of comprehensive  ROS otherwise negative.  HOME MEDS: Current Outpatient Medications on File Prior to Visit  Medication Sig Dispense Refill  . albuterol (PROVENTIL HFA;VENTOLIN HFA) 108 (90 Base) MCG/ACT inhaler Inhale 2 puffs into the lungs every 6 (six) hours as needed for wheezing or shortness of breath. 1 Inhaler 0  . BAYER MICROLET LANCETS lancets Use as instructed 100 each 2  . Blood Glucose Monitoring Suppl (BAYER CONTOUR NEXT LINK) w/Device KIT Use to check blood sugar 2 times per day. 1 kit 2  . canagliflozin (INVOKANA) 100 MG TABS tablet Take 1 tablet (100 mg total) by mouth every morning. 30 tablet 11  . carvedilol (COREG) 25 MG tablet TAKE 0.5 TABLETS (12.5 MG TOTAL) BY MOUTH 2 (TWO) TIMES DAILY.   -- NEW LOWER DOSE!!! (Patient taking differently: Take 25 mg by mouth 2 (two) times daily with a  meal. ) 100 tablet 4  . Dulaglutide (TRULICITY) 1.5 YP/9.5KD SOPN INJECT 1.5 MG INTO THE SKIN ONCE A WEEK.   NEED FOLLOW UP FOR FUTURE REFILLS 4 pen 11  . Evolocumab (REPATHA SURECLICK) 326 MG/ML SOAJ Inject 140 mg into the skin every 14 (fourteen) days. 2 pen 11  . fluocinonide ointment (LIDEX) 7.12 % Apply 1 application topically 2 (two) times daily. 30 g 1  . furosemide (LASIX) 20 MG tablet Take 1 tablet (20 mg total) by mouth daily. 90 tablet 3  . glipiZIDE (GLUCOTROL) 5 MG tablet TAKE ONE TABLET BY MOUTH TWICE A DAY BEFORE A MEAL (Patient taking differently: Take 5 mg by mouth 2 (two) times daily before a meal. TAKE ONE TABLET BY MOUTH TWICE A DAY BEFORE A MEAL) 200 tablet 4  . glucose blood (CONTOUR NEXT TEST) test strip Use as instructed to check sugar 2 times daily 200 each 5  . losartan (COZAAR) 25 MG tablet Take 1 tablet (25 mg total) by mouth 2 (two) times daily. 180 tablet 4  . metFORMIN (GLUCOPHAGE) 1000 MG tablet TAKE ONE TABLET BY MOUTH TWICE A DAY WITH FOOD (Patient taking differently: Take 1,000 mg by mouth 2 (two) times daily with a meal. TAKE ONE TABLET BY MOUTH TWICE A DAY WITH FOOD) 200 tablet 4   . nitroGLYCERIN (NITROSTAT) 0.4 MG SL tablet Place 1 tablet (0.4 mg total) under the tongue every 5 (five) minutes as needed for chest pain. 25 tablet 3  . rivaroxaban (XARELTO) 20 MG TABS tablet Take 1 tablet (20 mg total) by mouth daily with supper. 90 tablet 1   No current facility-administered medications on file prior to visit.     LABS/IMAGING: Results for orders placed or performed in visit on 02/26/18 (from the past 48 hour(s))  POCT glycosylated hemoglobin (Hb A1C)     Status: Abnormal   Collection Time: 02/26/18  1:09 PM  Result Value Ref Range   Hemoglobin A1C 6.4 (A) 4.0 - 5.6 %   HbA1c POC (<> result, manual entry)     HbA1c, POC (prediabetic range)     HbA1c, POC (controlled diabetic range)     No results found.  LIPID PANEL:    Component Value Date/Time   CHOL 116 06/26/2017 0903   TRIG 151 (H) 06/26/2017 0903   HDL 61 06/26/2017 0903   CHOLHDL 1.9 06/26/2017 0903   CHOLHDL 5.2 (H) 04/04/2016 0723   VLDL NOT CALC 04/04/2016 0723   LDLCALC 25 06/26/2017 0903   LDLDIRECT 50 04/04/2016 0723     WEIGHTS: Wt Readings from Last 3 Encounters:  02/27/18 148 lb 6.4 oz (67.3 kg)  02/26/18 147 lb 1.6 oz (66.7 kg)  02/16/18 150 lb 12.8 oz (68.4 kg)    VITALS: BP 98/62 Comment: feeling a little lightheaded  Pulse 87   Ht 5' 4"  (1.626 m)   Wt 148 lb 6.4 oz (67.3 kg)   BMI 25.47 kg/m   EXAM: General appearance: alert and no distress Neck: no carotid bruit, no JVD and thyroid not enlarged, symmetric, no tenderness/mass/nodules Lungs: clear to auscultation bilaterally Heart: regular rate and rhythm, S1, S2 normal, no murmur, click, rub or gallop Abdomen: soft, non-tender; bowel sounds normal; no masses,  no organomegaly Extremities: extremities normal, atraumatic, no cyanosis or edema Pulses: 2+ and symmetric Skin: Skin color, texture, turgor normal. No rashes or lesions Neurologic: Grossly normal Psych : Pleasant  EKG: EKG from PCP office yesterday shows  biventricular paced rhythm- personally reviewed  ASSESSMENT: 1. Progressive dyspnea on exertion 2. CAD with prior CABG and MI 3. Biventricular pacing with history of chronotropic incompetence 4. History of systolic congestive heart failure with improved LVEF  PLAN: 1.   Mrs. Brashears is having progressive dyspnea on exertion that signs or symptoms of acute congestive heart failure.  She has been appropriately biventricular paced and her LVEF had improved with this.  She does have prior CABG and has not had ischemia testing due to lack of symptoms recently.  She denies any anginal symptoms in the past rather has had that she has had shortness of breath which is similar and may be her anginal equivalent.  In addition she has some pressure across her back between the shoulder blades which could be angina.  I like for her to undergo a Lexiscan Myoview stress testing and plan follow-up with Dr. Loletha Grayer afterwards.  I also took the liberty to decrease her losartan from 25 mg twice daily to 25 mg once daily given her low systolic blood pressures in the 90s and recent weight loss on Invokana.  Pixie Casino, MD, [facility], [place] Director of the Advanced Lipid Disorders &  Cardiovascular Risk Reduction Clinic Diplomate of the American Board of Clinical Lipidology Attending Cardiologist  Direct Dial: [phone]  Fax: [phone]  Website:  [email or web address] [email or web address] 02/27/2018, 12:40 PM

## 2018-02-27 NOTE — Patient Instructions (Signed)
Medication Instructions:  Continue current medications If you need a refill on your cardiac medications before your next appointment, please call your pharmacy.   Testing/Procedures: Dr. Debara Pickett has ordered a Lexiscan Myocardial Perfusion Imaging Study.  Please arrive 15 minutes prior to your appointment time for registration and insurance purposes.   The test will take approximately 3 to 4 hours to complete; you may bring reading material.  If someone comes with you to your appointment, they will need to remain in the main lobby due to limited space in the testing area.   How to prepare for your Myocardial Perfusion Test:  Do not eat or drink 3 hours prior to your test, except you may have water.  Do not consume products containing caffeine (regular or decaffeinated) 12 hours prior to your test. (ex: coffee, chocolate, sodas, tea).  Do wear comfortable clothes (no dresses or overalls) and walking shoes, tennis shoes preferred (No heels or open toe shoes are allowed).  Do NOT wear cologne, perfume, aftershave, or lotions (deodorant is allowed).  If you use an inhaler, use it the AM of your test and bring it with you.   If you use a nebulizer, use it the AM of your test.   If these instructions are not followed, your test will have to be rescheduled.   Follow-Up: At [facility], you and your health needs are our priority.  As part of our continuing mission to provide you with exceptional heart care, we have created designated Provider Care Teams.  These Care Teams include your primary Cardiologist (physician) and Advanced Practice Providers (APPs -  Physician Assistants and Nurse Practitioners) who all work together to provide you with the care you need, when you need it. You will need a follow up appointment in 3-4 weeks after stress test. You may see Dr. Sallyanne Kuster or one of the following Advanced Practice Providers on your designated Care Team: Almyra Deforest, [place] . Fabian Sharp,  PA-C  Any Other Special Instructions Will Be Listed Below (If Applicable).

## 2018-02-27 NOTE — Progress Notes (Signed)
Thank you, [place].  Sounds like a great plan. MCr

## 2018-03-05 ENCOUNTER — Telehealth (HOSPITAL_COMMUNITY): Payer: Self-pay

## 2018-03-05 NOTE — Telephone Encounter (Signed)
Encounter complete. 

## 2018-03-06 ENCOUNTER — Ambulatory Visit (HOSPITAL_COMMUNITY)
Admission: RE | Admit: 2018-03-06 | Discharge: 2018-03-06 | Disposition: A | Discharge status: Home / Self Care | Payer: BLUE CROSS/BLUE SHIELD | Source: Ambulatory Visit | Attending: Cardiovascular Disease | Admitting: Cardiovascular Disease

## 2018-03-06 DIAGNOSIS — Z951 Presence of aortocoronary bypass graft: Secondary | ICD-10-CM | POA: Diagnosis not present

## 2018-03-06 DIAGNOSIS — R5383 Other fatigue: Secondary | ICD-10-CM | POA: Diagnosis not present

## 2018-03-06 DIAGNOSIS — R0602 Shortness of breath: Secondary | ICD-10-CM | POA: Insufficient documentation

## 2018-03-06 DIAGNOSIS — Z950 Presence of cardiac pacemaker: Secondary | ICD-10-CM | POA: Diagnosis not present

## 2018-03-06 LAB — MYOCARDIAL PERFUSION IMAGING
LV dias vol: 92 mL (ref 46–106)
LV sys vol: 43 mL
Peak HR: 109 {beats}/min
Rest HR: 78 {beats}/min
SDS: 2
SRS: 2
SSS: 4
TID: 1.09

## 2018-03-06 MED ORDER — TECHNETIUM TC 99M TETROFOSMIN IV KIT
32.2000 | PACK | Freq: Once | INTRAVENOUS | Status: AC | PRN
  Administered 2018-03-06: 32.2 via INTRAVENOUS
  Filled 2018-03-06: qty 33

## 2018-03-06 MED ORDER — REGADENOSON 0.4 MG/5ML IV SOLN
0.4000 mg | Freq: Once | INTRAVENOUS | Status: AC
  Administered 2018-03-06: 0.4 mg via INTRAVENOUS

## 2018-03-06 MED ORDER — TECHNETIUM TC 99M TETROFOSMIN IV KIT
11.0000 | PACK | Freq: Once | INTRAVENOUS | Status: AC | PRN
  Administered 2018-03-06: 11 via INTRAVENOUS
  Filled 2018-03-06: qty 11

## 2018-03-09 DIAGNOSIS — M25531 Pain in right wrist: Secondary | ICD-10-CM | POA: Diagnosis not present

## 2018-03-09 DIAGNOSIS — S52571D Other intraarticular fracture of lower end of right radius, subsequent encounter for closed fracture with routine healing: Secondary | ICD-10-CM | POA: Diagnosis not present

## 2018-03-17 ENCOUNTER — Ambulatory Visit: Payer: BLUE CROSS/BLUE SHIELD | Admitting: Emergency Medicine

## 2018-03-24 ENCOUNTER — Encounter: Payer: Self-pay | Admitting: Emergency Medicine

## 2018-03-24 ENCOUNTER — Ambulatory Visit: Payer: BLUE CROSS/BLUE SHIELD | Admitting: Emergency Medicine

## 2018-03-24 VITALS — BP 116/68 | HR 84 | Ht 64.0 in | Wt 151.0 lb

## 2018-03-24 DIAGNOSIS — J301 Allergic rhinitis due to pollen: Secondary | ICD-10-CM | POA: Diagnosis not present

## 2018-03-24 DIAGNOSIS — R0600 Dyspnea, unspecified: Secondary | ICD-10-CM

## 2018-03-24 DIAGNOSIS — R0609 Other forms of dyspnea: Secondary | ICD-10-CM | POA: Diagnosis not present

## 2018-03-24 NOTE — Assessment & Plan Note (Signed)
She does have fairly bothersome seasonal allergies.  Question whether this may have precipitated her cough in November.  The cough is now better but the dyspnea persists.  No indication to start scheduled allergy routine right now.

## 2018-03-24 NOTE — Patient Instructions (Addendum)
We will repeat your pulmonary function testing at your next office visit Do not use the albuterol inhaler for now.  We will decide going forward whether to continue it Depending on your pulmonary function testing results we may decide to repeat your cardiopulmonary exercise test Follow with Dr. Lamonte Sakai next available with full pulmonary function testing on the same day.

## 2018-03-24 NOTE — Progress Notes (Signed)
Subjective:    Patient ID: [name], female    DOB: Apr 18, 1950, 68 y.o.   MRN: [id number]  HPI  ROV 11/16/14 -- follow-up visit for evaluation of dyspnea. She has known coronary disease with ischemic cardiomyopathy, hypertension, atrial fibrillation. Because she had possible mild active lung disease on pulmonary function testing that seemed out of proportion to her symptoms I asked her to undergo cardiopulmonary issues as test. This was performed on 10/25/14 and I have reviewed the results. She had mild to moderate decreased functional capacity compared to sedentary norms, and the most remarkable aspect of her testing was chronotropic incompetence in the setting of maximal exercise. Her carvedilol has therefore been decreased by Dr Avon Gully to 12.5 bid. She hasn't noticed a difference in her dyspnea yet. She has started exercising / walking more, but she hasn't noticed an improvement in SOB yet.   New consult to re-establish care 03/24/18 --  [name] is 6 former minimal smoker (10 pk-yrs) with a history of coronary disease, CABG, ischemic cardiomyopathy, dyspnea that was evaluated by me and cardiology going back to 2016.  She was found to have chronotropic incompetence on cardiopulmonary exercise testing, has since had AICD/pacemaker placed.  Also with a history of remote breast cancer.  She had initial improvement in her dyspnea after the biventricular pacemaker was placed.    She now reports that she began to have cough, some changes in her voice quality in late November. She was given abx x 7 days in Dec. The cough persisted until early this month, is not improved. She still has voice changes, worse through the day. She has had progressive dyspnea since this cough started. A CXR 02/16/18 showed some evidence for hyperinflation, otherwise normal.   A myocardial perfusion scan was done on 03/06/2018 which was low risk without any reversible ischemia, normal LV function.  Her pacemaker has been  working appropriately  Review of Systems  Constitutional: Negative for fever and unexpected weight change.  HENT: Negative for congestion, dental problem, ear pain, nosebleeds, postnasal drip, rhinorrhea, sinus pressure, sneezing, sore throat and trouble swallowing.   Eyes: Negative for redness and itching.  Respiratory: Positive for shortness of breath. Negative for cough, chest tightness and wheezing.   Cardiovascular: Negative for palpitations and leg swelling.  Gastrointestinal: Negative for nausea and vomiting.  Genitourinary: Negative for dysuria.  Musculoskeletal: Negative for joint swelling.  Skin: Negative for rash.  Neurological: Negative for headaches.  Hematological: Does not bruise/bleed easily.  Psychiatric/Behavioral: Negative for dysphoric mood. The patient is not nervous/anxious.        Objective:   Physical Exam Vitals:   03/24/18 0926  BP: 116/68  Pulse: 84  SpO2: 99%  Weight: 151 lb (68.5 kg)  Height: 5\' 4"  (1.626 m)   Gen: Pleasant, well-nourished, in no distress,  normal affect  ENT: No lesions,  mouth clear,  oropharynx clear, no postnasal drip  Neck: No JVD, no TMG, no carotid bruits  Lungs: No use of accessory muscles, distant BS, clear without rales or rhonchi  Cardiovascular: RRR, heart sounds normal, no murmur or gallops, no peripheral edema  Musculoskeletal: No deformities, no cyanosis or clubbing  Neuro: alert, non focal  Skin: Warm, no lesions or rashes   TTE 05/09/14 --  Study Conclusions - Left ventricle: The cavity size was normal. Wall thickness was normal. Systolic function was normal. The estimated ejection fraction was in the range of 50% to 55%. Doppler parameters are consistent with abnormal left ventricular relaxation (  grade 1 diastolic dysfunction). Normal RV size and fxn. Normal PAP's    CXR 05/06/14 --  COMPARISON: 10/23/2013 FINDINGS: The heart size and mediastinal contours are normal. Vascular pattern is  normal. Lungs are clear. Cardiac pacer identified with leads in unchanged position in generator over left thorax as previously noted. IMPRESSION: No active cardiopulmonary disease     Assessment & Plan:  Allergic rhinitis She does have fairly bothersome seasonal allergies.  Question whether this may have precipitated her cough in November.  The cough is now better but the dyspnea persists.  No indication to start scheduled allergy routine right now.  Exertional dyspnea Etiology unclear.  She does have significant cardiac disease but this has been evaluated and appears to be well compensated.  She had a reassuring perfusion stress scan.  Given her history of tobacco, hyperinflation on chest x-ray I think we must consider some evolving obstructive disease that was not is relevant in 2016.  She needs repeat pulmonary function testing and she may actually need a repeat cardiopulmonary exercise test for [place] to clarify.  I discussed this with her today.  We will start with a PFT.  Baltazar Apo, MD, PhD 03/24/2018, 10:07 AM [facility] [phone] or if no answer [phone]

## 2018-03-24 NOTE — Assessment & Plan Note (Signed)
Etiology unclear.  She does have significant cardiac disease but this has been evaluated and appears to be well compensated.  She had a reassuring perfusion stress scan.  Given her history of tobacco, hyperinflation on chest x-ray I think we must consider some evolving obstructive disease that was not is relevant in 2016.  She needs repeat pulmonary function testing and she may actually need a repeat cardiopulmonary exercise test for [place] to clarify.  I discussed this with her today.  We will start with a PFT.

## 2018-03-31 ENCOUNTER — Encounter: Payer: Self-pay | Admitting: Internal Medicine

## 2018-03-31 ENCOUNTER — Ambulatory Visit: Payer: BLUE CROSS/BLUE SHIELD | Admitting: Internal Medicine

## 2018-03-31 VITALS — BP 120/70 | HR 68 | Ht 64.0 in | Wt 150.0 lb

## 2018-03-31 DIAGNOSIS — E559 Vitamin D deficiency, unspecified: Secondary | ICD-10-CM | POA: Diagnosis not present

## 2018-03-31 DIAGNOSIS — E782 Mixed hyperlipidemia: Secondary | ICD-10-CM

## 2018-03-31 DIAGNOSIS — E1159 Type 2 diabetes mellitus with other circulatory complications: Secondary | ICD-10-CM | POA: Diagnosis not present

## 2018-03-31 MED ORDER — GLIPIZIDE 5 MG PO TABS: ORAL_TABLET | ORAL | 4 refills | Status: DC

## 2018-03-31 MED ORDER — DULAGLUTIDE 1.5 MG/0.5ML ~~LOC~~ SOAJ: SUBCUTANEOUS | 11 refills | Status: DC

## 2018-03-31 MED ORDER — CANAGLIFLOZIN 100 MG PO TABS: 100.0000 mg | ORAL_TABLET | Freq: Every morning | ORAL | 11 refills | Status: DC

## 2018-03-31 MED ORDER — METFORMIN HCL 1000 MG PO TABS: ORAL_TABLET | ORAL | 4 refills | Status: DC

## 2018-03-31 NOTE — Progress Notes (Signed)
Patient ID: [name], female   DOB: December 16, 1950, 68 y.o.   MRN: [id number]  HPI: [name] is a 68 y.o.-year-old female, returning for f/u for DM2, dx 2002, insulin-independent, uncontrolled, with complications (CAD, s/p CABG) and vitamin D deficiency. Last visit 4 months ago.  She has had a URI since 01/2018. Now cough resolved >> ABx and inhaler. She is still SOB and fatigued. She has further pulmonary testing with Dr. Lamonte Sakai.  DM2: Last hemoglobin A1c was: Lab Results  Component Value Date   HGBA1C 6.4 (A) 02/26/2018   HGBA1C 7.9 (H) 11/08/2017   HGBA1C 7.3 04/29/2017  Patient has been on insulin before >> hypoglycemia.   Pt is on a regimen of: - Metformin 2000 mg with dinner - Invokana 100 mg in a.m. - Trulicity 1.5 mg daily weekly - Glipizide 5 mg before breakfast and dinner Could not afford Januvia 100 mg daily >> 100$ reduced from 348%. She was on Amaryl 2 mg bid. She was previously on Bydureon.  She checks sugars twice a day: - am:90s, 178 >> 150-208 >> 140-155 >> 66, 77-145, 158, 160 - after b'fast; >> 155-199, 235 >> n/c - before lunch:  70-80s >> 81-150, 171 >> n/c  - after lunch: 101-106 >> n/c >> 165-209 >> n/c >> 92-130 - before dinner: 119, 131-187 >> n/c  - after dinner:  128-197 >> 89 >> 90-179, 193 - bedtime:  n/c >> 179, 205, 260 >> n/c Lowest sugar was 81 >> 89 >> 66; she has hypoglycemia awareness in the 60s. Highest sugar was 200s >> 155 >> 193.  -No CKD, last BUN/creatinine: Lab Results  Component Value Date   BUN 15 01/02/2018   CREATININE 0.70 01/02/2018  On losartan. -+ HL; last set of lipids: Lab Results  Component Value Date   CHOL 116 06/26/2017   HDL 61 06/26/2017   LDLCALC 25 06/26/2017   LDLDIRECT 50 04/04/2016   TRIG 151 (H) 06/26/2017   CHOLHDL 1.9 06/26/2017  She continues on Repatha- initially started in 10/2015, then had to stop due to lack of insurance coverage, but restarted before last visit.  She has intolerance to  statins.  She is seeing Dr. Sallyanne Kuster. - last eye exam was 2019: No DR. Has cataracts - had surgery in 10/2015.  She also has a history of laser surgery. -Denies numbness and tingling in her feet. On ASA 81.  History of vitamin D deficiency:  Latest vitamin D level was slightly low: Lab Results  Component Value Date   VD25OH 26.08 (L) 10/04/2016   VD25OH 38.79 02/22/2016   VD25OH 12.35 (L) 10/10/2015  She continues vitamin D 5000 units daily.  She has a h/o BrCA  - s/p B mastectomy.  She had a wrist fx and Sx 10/2017.  ROS: Constitutional: no weight gain/no weight loss, + fatigue, no subjective hyperthermia, no subjective hypothermia Eyes: no blurry vision, no xerophthalmia ENT: no sore throat, no nodules palpated in neck, no dysphagia, no odynophagia, + hoarseness Cardiovascular: no CP/+ SOB/no palpitations/no leg swelling Respiratory: no cough/+ SOB/no wheezing Gastrointestinal: no N/no V/no D/no C/no acid reflux Musculoskeletal: no muscle aches/no joint aches Skin: no rashes, no hair loss Neurological: no tremors/no numbness/no tingling/no dizziness  I reviewed pt's medications, allergies, PMH, social hx, family hx, and changes were documented in the history of present illness. Otherwise, unchanged from my initial visit note.  Past Medical History:  Diagnosis Date  . AICD (automatic cardioverter/defibrillator) present 01/2013   Biventricular cardiac pacemaker in situ   .  Allergic rhinitis   . CAD (coronary artery disease)    a. 2004: s/p MI in [place]. No PCI->Medical RX;  b. 07/2012 Cath: LM 30-40, LAD 70p, 70/71m D1 80-90p, OM1 small 90p, OM2 large 80-90p, 549m70-80d, RCA 20-30 diff, EF 40%, glob HK.s/p CABG  . [facility]   Patient reports left breast cancer diagnosis in 2002 treated with bilateral mastectomy positive lymph nodes with left axillary dissection followed by chemotherapy of unknown type  . Cataract   . Diabetes mellitus type II   .  Exertional shortness of breath   . Hyperlipidemia   . Hypertension   . Ischemic cardiomyopathy    a. 07/2012 Echo: EF 35%, Sev inferoseptal HK, mildly dil LA, Peak PASP 5918m.  . LMarland KitchenBB (left bundle branch block)    a. intermittent - present during rapid afib 07/2012.  . NMarland Kitchenuromuscular disorder ([facility]  Patient reports chronic numbness in the right foot related to previous surgery on the right leg and "nerve damage"  . PAF (paroxysmal atrial fibrillation) ([place]  a. 07/2012: Amio and xarelto initiated.  . SBO (small bowel obstruction) ([facility]  Past Surgical History:  Procedure Laterality Date  . ABDOMINAL HYSTERECTOMY  2000  . APPENDECTOMY  1974  . BI-VENTRICULAR PACEMAKER INSERTION N/A 01/25/2013   Procedure: BI-VENTRICULAR PACEMAKER INSERTION (CRT-P);  Surgeon: GreEvans LanceD;  [facility];  Service: Cardiovascular;  Laterality: N/A;  . BREAST BIOPSY Left 2002  . CARDIAC CATHETERIZATION    . CHOLECYSTECTOMY OPEN  1974  . CORONARY ARTERY BYPASS GRAFT N/A 09/28/2012   Procedure: CORONARY ARTERY BYPASS GRAFTING (CABG);  Surgeon: EdwGrace IsaacD;  [facility] [place];  Laterality: N/A;  CABG x four, using left internal mammary artery and left leg greater saphenous vein harvested endoscopically  . DILATION AND CURETTAGE OF UTERUS  1983  . EPICARDIAL PACING LEAD PLACEMENT N/A 09/28/2012   Procedure: EPICARDIAL PACING LEAD PLACEMENT;  Surgeon: EdwGrace IsaacD;  [facility] [place];  Laterality: N/A;  LV LEAD PLACEMENT  . HERNIA REPAIR    . INCISIONAL HERNIA REPAIR N/A 05/25/2015   Procedure: LAPAROSCOPIC INCISIONAL HERNIA WITH MESH ;  Surgeon: MatRolm BookbinderD;  [facility] [place];  Laterality: N/A;  . INCONTINENCE SURGERY  2000  . INSERTION OF MESH N/A 05/25/2015   Procedure: INSERTION OF MESH;  Surgeon: MatRolm BookbinderD;  [facility] [place] [place];  Laterality: N/A;  . INTRAOPERATIVE  TRANSESOPHAGEAL ECHOCARDIOGRAM N/A 09/28/2012   Procedure: INTRAOPERATIVE TRANSESOPHAGEAL ECHOCARDIOGRAM;  Surgeon: EdwGrace IsaacD;  [facility] [place];  Laterality: N/A;  . LAPAROSCOPIC INCISIONAL / UMBILICAL / VENTRAL HERNIA REPAIR  05/25/2015   IHR  . LEFT HEART CATHETERIZATION WITH CORONARY ANGIOGRAM N/A 07/20/2012   Procedure: LEFT HEART CATHETERIZATION WITH CORONARY ANGIOGRAM;  Surgeon: Peter M JorMartiniqueD;  [facility];  Service: Cardiovascular;  Laterality: N/A;  . MASTECTOMY Right 2002  . MASTECTOMY MODIFIED RADICAL W/ AXILLARY LYMPH NODES W/ OR W/O PECTORALIS MINOR Left 2002  . MAZE N/A 09/28/2012   Procedure: MAZE;  Surgeon: EdwGrace IsaacD;  [facility] [place];  Laterality: N/A;  . ORIF WRIST FRACTURE Right 11/08/2017   Procedure: OPEN REDUCTION INTERNAL FIXATION (ORIF) WRIST FRACTURE;  Surgeon: GraRoseanne KaufmanD;  [facility] [place] [place];  Laterality: Right;  90  mins  . TENDON REPAIR Right 2001 X 3-4   torn ligaments and tendons in ankle up to knee from work related accident  . TUBAL LIGATION  1987   Social History   Socioeconomic History  . Marital status: Married    Spouse name: Not on file  . Number of children: 3  . Years of education: Not on file  . Highest education level: Not on file  Occupational History  . Occupation: Marketing executive work    Fish farm manager: [employer]. [employer].  Social Needs  . Financial resource strain: Not on file  . Food insecurity:    Worry: Not on file    Inability: Not on file  . Transportation needs:    Medical: Not on file    Non-medical: Not on file  Tobacco Use  . Smoking status: Former Smoker    Packs/day: 1.00    Years: 10.00    Pack years: 10.00    Types: Cigarettes    Last attempt to quit: 03/11/2012    Years since quitting: 6.0  . Smokeless tobacco: Never Used  Substance and Sexual Activity  . Alcohol use: No  . Drug use: No  . Sexual activity:  Yes    Partners: Female    Birth control/protection: Surgical  Lifestyle  . Physical activity:    Days per week: Not on file    Minutes per session: Not on file  . Stress: Not on file  Relationships  . Social connections:    Talks on phone: Not on file    Gets together: Not on file    Attends religious service: Not on file    Active member of club or organization: Not on file    Attends meetings of clubs or organizations: Not on file    Relationship status: Not on file  . Intimate partner violence:    Fear of current or ex partner: Not on file    Emotionally abused: Not on file    Physically abused: Not on file    Forced sexual activity: Not on file  Other Topics Concern  . Not on file  Social History Narrative   Lives with husband and mother in law.     Regular exercise: walking   Caffeine use: 2 cups of coffee in the morning   Current Outpatient Medications on File Prior to Visit  Medication Sig Dispense Refill  . albuterol (PROVENTIL HFA;VENTOLIN HFA) 108 (90 Base) MCG/ACT inhaler Inhale 2 puffs into the lungs every 6 (six) hours as needed for wheezing or shortness of breath. (Patient not taking: Reported on 03/24/2018) 1 Inhaler 0  . BAYER MICROLET LANCETS lancets Use as instructed 100 each 2  . Blood Glucose Monitoring Suppl (BAYER CONTOUR NEXT LINK) w/Device KIT Use to check blood sugar 2 times per day. 1 kit 2  . canagliflozin (INVOKANA) 100 MG TABS tablet Take 1 tablet (100 mg total) by mouth every morning. 30 tablet 11  . carvedilol (COREG) 25 MG tablet TAKE 0.5 TABLETS (12.5 MG TOTAL) BY MOUTH 2 (TWO) TIMES DAILY.   -- NEW LOWER DOSE!!! (Patient taking differently: Take 25 mg by mouth 2 (two) times daily with a meal. ) 100 tablet 4  . Dulaglutide (TRULICITY) 1.5 IR/4.8NI SOPN INJECT 1.5 MG INTO THE SKIN ONCE A WEEK.   NEED FOLLOW UP FOR FUTURE REFILLS 4 pen 11  . Evolocumab (REPATHA SURECLICK) 627 MG/ML SOAJ Inject 140 mg into the skin every 14 (fourteen) days. 2 pen 11   . fluocinonide  ointment (LIDEX) 0.30 % Apply 1 application topically 2 (two) times daily. (Patient not taking: Reported on 03/24/2018) 30 g 1  . furosemide (LASIX) 20 MG tablet Take 1 tablet (20 mg total) by mouth daily. 90 tablet 3  . glipiZIDE (GLUCOTROL) 5 MG tablet TAKE ONE TABLET BY MOUTH TWICE A DAY BEFORE A MEAL (Patient taking differently: Take 5 mg by mouth 2 (two) times daily before a meal. TAKE ONE TABLET BY MOUTH TWICE A DAY BEFORE A MEAL) 200 tablet 4  . glucose blood (CONTOUR NEXT TEST) test strip Use as instructed to check sugar 2 times daily 200 each 5  . losartan (COZAAR) 25 MG tablet Take 1 tablet (25 mg total) by mouth 2 (two) times daily. 180 tablet 4  . metFORMIN (GLUCOPHAGE) 1000 MG tablet TAKE ONE TABLET BY MOUTH TWICE A DAY WITH FOOD (Patient taking differently: Take 1,000 mg by mouth 2 (two) times daily with a meal. TAKE ONE TABLET BY MOUTH TWICE A DAY WITH FOOD) 200 tablet 4  . nitroGLYCERIN (NITROSTAT) 0.4 MG SL tablet Place 1 tablet (0.4 mg total) under the tongue every 5 (five) minutes as needed for chest pain. 25 tablet 3  . rivaroxaban (XARELTO) 20 MG TABS tablet Take 1 tablet (20 mg total) by mouth daily with supper. 90 tablet 1   No current facility-administered medications on file prior to visit.    Allergies  Allergen Reactions  . Statins Other (See Comments)    Myalgias with rosuvastatin, atorvastatin and simvastatin    Family History  Problem Relation Age of Onset  . Kidney failure Mother   . Heart disease Mother        Died mid 09Q complications of diabetes  . Colon cancer Sister   . Arthritis Other   . Cancer Other        ovarian  . Diabetes Other   . Hyperlipidemia Other   . Liver cancer Brother   . Stomach cancer Neg Hx   . Rectal cancer Neg Hx   . Esophageal cancer Neg Hx     PE: BP 120/70   Pulse 68   Ht _0  (1.626 m) Comment: measured  Wt 150 lb (68 kg)   SpO2 98%   BMI 25.75 kg/m  Body mass index is 25.75 kg/m.  Wt Readings  from Last 3 Encounters:  03/31/18 150 lb (68 kg)  03/24/18 151 lb (68.5 kg)  03/06/18 148 lb (67.1 kg)   Constitutional: overweight, in NAD Eyes: PERRLA, EOMI, no exophthalmos ENT: moist mucous membranes, no thyromegaly, no cervical lymphadenopathy Cardiovascular: RRR, No MRG Respiratory: CTA B Gastrointestinal: abdomen soft, NT, ND, BS+ Musculoskeletal: no deformities, strength intact in all 4 Skin: moist, warm, no rashes Neurological: no tremor with outstretched hands, DTR normal in all 4  ASSESSMENT: 1. 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 Vitamin D deficiency  3. HL  PLAN:  1. Patient with longstanding, uncontrolled, type 2 diabetes, on oral antidiabetic regimen and GLP-1 receptor agonist.  At last visit, she only had few sugars checked and they were higher than goal in the morning but improves later in the day.  In the evening, sugars were at goal.  Therefore, we did not change her regimen but did discuss about checking sugars more consistently. -She had a recent HbA1c checked last month and this was greatly improved, at  6.4%. -At this visit, sugars are auto close to goal with 3 exceptions -No need to change her medication regimen for now - I advised her to: Patient Instructions  Please continue: - Metformin 2000 mg with dinner - Invokana 100 mg in a.m. - Trulicity 1.5 mg daily weekly - Glipizide 5 mg before breakfast and dinner  Please come back for a follow-up appointment in 4 months.  - continue checking sugars at different times of the day - check 1x a day, rotating checks - advised for yearly eye exams >> she is UTD - Return to clinic in 4 mo with sugar log   2. Vitamin D deficiency -She has a history of vitamin D deficiency after which we increase her vitamin D supplement to 5000 units daily.  She continues on this dose. -We  will check another level today  3. HL - Reviewed latest lipid panel from 06/2017: LDL improved, excellent, triglycerides slightly above goal Lab Results  Component Value Date   CHOL 116 06/26/2017   HDL 61 06/26/2017   LDLCALC 25 06/26/2017   LDLDIRECT 50 04/04/2016   TRIG 151 (H) 06/26/2017   CHOLHDL 1.9 06/26/2017  - Continues PCSK9 inhibitor without side effects.  Philemon Kingdom, MD PhD [facility]

## 2018-03-31 NOTE — Patient Instructions (Addendum)
Please continue: - Metformin 2000 mg with dinner - Invokana 100 mg in a.m. - Trulicity 1.5 mg daily weekly - Glipizide 5 mg before breakfast and dinner  Please come back for a follow-up appointment in 4 months.

## 2018-04-01 ENCOUNTER — Ambulatory Visit: Payer: BLUE CROSS/BLUE SHIELD | Admitting: Physician Assistant

## 2018-04-02 LAB — CUP PACEART REMOTE DEVICE CHECK
Battery Remaining Longevity: 54 mo
Battery Voltage: 2.92 V
Brady Statistic AP VP Percent: 1 %
Brady Statistic AP VS Percent: 1 %
Brady Statistic AS VP Percent: 99 %
Brady Statistic AS VS Percent: 1 %
Brady Statistic RA Percent Paced: 1 %
Date Time Interrogation Session: 20191203070015
Implantable Lead Implant Date: 20140721
Implantable Lead Implant Date: 20141117
Implantable Lead Implant Date: 20141117
Implantable Lead Location: 753858
Implantable Lead Location: 753859
Implantable Lead Location: 753860
Implantable Lead Model: 5071
Lead Channel Impedance Value: 400 Ohm
Lead Channel Impedance Value: 410 Ohm
Lead Channel Impedance Value: 490 Ohm
Lead Channel Pacing Threshold Amplitude: 0.75 V
Lead Channel Pacing Threshold Amplitude: 0.75 V
Lead Channel Pacing Threshold Amplitude: 1.5 V
Lead Channel Pacing Threshold Pulse Width: 0.4 ms
Lead Channel Pacing Threshold Pulse Width: 0.4 ms
Lead Channel Pacing Threshold Pulse Width: 0.7 ms
Lead Channel Sensing Intrinsic Amplitude: 12 mV
Lead Channel Setting Pacing Amplitude: 2 V
Lead Channel Setting Pacing Amplitude: 2.5 V
Lead Channel Setting Pacing Pulse Width: 0.4 ms
Lead Channel Setting Pacing Pulse Width: 0.7 ms
Lead Channel Setting Sensing Sensitivity: 2 mV
MDC IDC MSMT BATTERY REMAINING PERCENTAGE: 71 %
MDC IDC MSMT LEADCHNL RA SENSING INTR AMPL: 0.9 mV
MDC IDC PG IMPLANT DT: 20141117
MDC IDC SET LEADCHNL RV PACING AMPLITUDE: 2.5 V
Pulse Gen Model: 3222
Pulse Gen Serial Number: 2981305

## 2018-04-08 DIAGNOSIS — H35372 Puckering of macula, left eye: Secondary | ICD-10-CM | POA: Diagnosis not present

## 2018-04-08 DIAGNOSIS — E113391 Type 2 diabetes mellitus with moderate nonproliferative diabetic retinopathy without macular edema, right eye: Secondary | ICD-10-CM | POA: Diagnosis not present

## 2018-04-08 DIAGNOSIS — D4981 Neoplasm of unspecified behavior of retina and choroid: Secondary | ICD-10-CM | POA: Diagnosis not present

## 2018-04-08 DIAGNOSIS — E113312 Type 2 diabetes mellitus with moderate nonproliferative diabetic retinopathy with macular edema, left eye: Secondary | ICD-10-CM | POA: Diagnosis not present

## 2018-04-08 DIAGNOSIS — Z961 Presence of intraocular lens: Secondary | ICD-10-CM | POA: Diagnosis not present

## 2018-04-08 LAB — HM DIABETES EYE EXAM

## 2018-04-14 LAB — CUP PACEART INCLINIC DEVICE CHECK
Implantable Lead Implant Date: 20140721
Implantable Lead Implant Date: 20141117
Implantable Lead Implant Date: 20141117
Implantable Lead Location: 753858
Implantable Lead Location: 753859
Implantable Lead Model: 5071
Implantable Pulse Generator Implant Date: 20141117
MDC IDC LEAD LOCATION: 753860
MDC IDC SESS DTM: 20200204153459
Pulse Gen Model: 3222
Pulse Gen Serial Number: 2981305

## 2018-04-16 ENCOUNTER — Encounter: Payer: Self-pay | Admitting: Emergency Medicine

## 2018-04-16 ENCOUNTER — Ambulatory Visit (INDEPENDENT_AMBULATORY_CARE_PROVIDER_SITE_OTHER): Payer: BLUE CROSS/BLUE SHIELD | Admitting: Emergency Medicine

## 2018-04-16 DIAGNOSIS — R0602 Shortness of breath: Secondary | ICD-10-CM | POA: Diagnosis not present

## 2018-04-16 DIAGNOSIS — R0600 Dyspnea, unspecified: Secondary | ICD-10-CM | POA: Diagnosis not present

## 2018-04-16 LAB — PULMONARY FUNCTION TEST
DL/VA % pred: 99 %
DL/VA: 4.17 ml/min/mmHg/L
DLCO unc % pred: 97 %
DLCO unc: 18.65 ml/min/mmHg
FEF 25-75 Post: 2.23 L/sec
FEF 25-75 Pre: 1.89 L/sec
FEF2575-%Change-Post: 18 %
FEF2575-%Pred-Post: 109 %
FEF2575-%Pred-Pre: 93 %
FEV1-%Change-Post: 4 %
FEV1-%PRED-PRE: 88 %
FEV1-%Pred-Post: 92 %
FEV1-POST: 2.12 L
FEV1-Pre: 2.03 L
FEV1FVC-%Change-Post: 3 %
FEV1FVC-%Pred-Pre: 103 %
FEV6-%Change-Post: 1 %
FEV6-%Pred-Post: 89 %
FEV6-%Pred-Pre: 88 %
FEV6-Post: 2.58 L
FEV6-Pre: 2.55 L
FEV6FVC-%CHANGE-POST: 0 %
FEV6FVC-%Pred-Post: 104 %
FEV6FVC-%Pred-Pre: 104 %
FVC-%Change-Post: 0 %
FVC-%Pred-Post: 85 %
FVC-%Pred-Pre: 85 %
FVC-Post: 2.58 L
FVC-Pre: 2.56 L
POST FEV1/FVC RATIO: 82 %
Post FEV6/FVC ratio: 100 %
Pre FEV1/FVC ratio: 80 %
Pre FEV6/FVC Ratio: 100 %
RV % pred: 107 %
RV: 2.21 L
TLC % pred: 96 %
TLC: 4.75 L

## 2018-04-16 NOTE — Assessment & Plan Note (Signed)
She has a reassuring recent myocardial perfusion scan and now normal PFT today.  She had a normal TSH about 10 months ago.  I did reassure her about these test results.  We will follow her exertional tolerance, symptoms.  Consider other work-up including a possible cardiopulmonary exercise test, more in-depth chest imaging should her symptoms progress.  Your pulmonary function testing today is normal.  This is good news. Please continue to do your daily exercise Follow with Dr. Lamonte Sakai in 6 months and we will review your symptoms, decide if any other testing would be helpful.

## 2018-04-16 NOTE — Progress Notes (Signed)
Subjective:    Patient ID: [name], female    DOB: [birth year], 68 y.o.   MRN: [id number]  HPI  ROV 11/16/14 -- follow-up visit for evaluation of dyspnea. She has known coronary disease with ischemic cardiomyopathy, hypertension, atrial fibrillation. Because she had possible mild active lung disease on pulmonary function testing that seemed out of proportion to her symptoms I asked her to undergo cardiopulmonary issues as test. This was performed on 10/25/14 and I have reviewed the results. She had mild to moderate decreased functional capacity compared to sedentary norms, and the most remarkable aspect of her testing was chronotropic incompetence in the setting of maximal exercise. Her carvedilol has therefore been decreased by Dr Avon Gully to 12.5 bid. She hasn't noticed a difference in her dyspnea yet. She has started exercising / walking more, but she hasn't noticed an improvement in SOB yet.   New consult to re-establish care 03/24/18 --  [name] is 68 former minimal smoker (10 pk-yrs) with a history of coronary disease, CABG, ischemic cardiomyopathy, dyspnea that was evaluated by me and cardiology going back to 2016.  She was found to have chronotropic incompetence on cardiopulmonary exercise testing, has since had AICD/pacemaker placed.  Also with a history of remote breast cancer.  She had initial improvement in her dyspnea after the biventricular pacemaker was placed.    She now reports that she began to have cough, some changes in her voice quality in late November. She was given abx x 7 days in Dec. The cough persisted until early this month, is not improved. She still has voice changes, worse through the day. She has had progressive dyspnea since this cough started. A CXR 02/16/18 showed some evidence for hyperinflation, otherwise normal.   A myocardial perfusion scan was done on 03/06/2018 which was low risk without any reversible ischemia, normal LV function.  Her pacemaker has been  working appropriately  ROV 04/16/18 --[name] is 68, returns today for continued evaluation of her cough and shortness of breath.  She underwent pulmonary function testing today that I reviewed.  This showed normal airflows, normal lung volumes, normal diffusion capacity.  Minimal curve to her flow volume loop but no definitive obstructive lung disease. She continues to feel run-down mid-day, fatigued, some exertional SOB. Can also happen at rest. Can develop some weak voice, hoarseness. She has rare GERD. She walks a mile daily.    Review of Systems  Constitutional: Negative for fever and unexpected weight change.  HENT: Negative for congestion, dental problem, ear pain, nosebleeds, postnasal drip, rhinorrhea, sinus pressure, sneezing, sore throat and trouble swallowing.   Eyes: Negative for redness and itching.  Respiratory: Positive for shortness of breath. Negative for cough, chest tightness and wheezing.   Cardiovascular: Negative for palpitations and leg swelling.  Gastrointestinal: Negative for nausea and vomiting.  Genitourinary: Negative for dysuria.  Musculoskeletal: Negative for joint swelling.  Skin: Negative for rash.  Neurological: Negative for headaches.  Hematological: Does not bruise/bleed easily.  Psychiatric/Behavioral: Negative for dysphoric mood. The patient is not nervous/anxious.        Objective:   Physical Exam Vitals:   04/16/18 1127  BP: 134/70  Pulse: 85  SpO2: 100%  Weight: 153 lb (69.4 kg)  Height: 5\' 3"  (1.6 m)   Gen: Pleasant, well-nourished, in no distress,  normal affect  ENT: No lesions,  mouth clear,  oropharynx clear, no postnasal drip  Neck: No JVD, no stridor  Lungs: No use of accessory muscles, distant BS, clear  without rales or rhonchi  Cardiovascular: RRR, heart sounds normal, no murmur or gallops, no peripheral edema  Musculoskeletal: No deformities, no cyanosis or clubbing  Neuro: alert, non focal  Skin: Warm, no lesions or  rashes   TTE 05/09/14 --  Study Conclusions - Left ventricle: The cavity size was normal. Wall thickness was normal. Systolic function was normal. The estimated ejection fraction was in the range of 50% to 55%. Doppler parameters are consistent with abnormal left ventricular relaxation (grade 1 diastolic dysfunction). Normal RV size and fxn. Normal PAP's    CXR 05/06/14 --  COMPARISON: 10/23/2013 FINDINGS: The heart size and mediastinal contours are normal. Vascular pattern is normal. Lungs are clear. Cardiac pacer identified with leads in unchanged position in generator over left thorax as previously noted. IMPRESSION: No active cardiopulmonary disease     Assessment & Plan:  SOB (shortness of breath) She has a reassuring recent myocardial perfusion scan and now normal PFT today.  She had a normal TSH about 10 months ago.  I did reassure her about these test results.  We will follow her exertional tolerance, symptoms.  Consider other work-up including a possible cardiopulmonary exercise test, more in-depth chest imaging should her symptoms progress.  Your pulmonary function testing today is normal.  This is good news. Please continue to do your daily exercise Follow with Dr. Lamonte Sakai in 6 months and we will review your symptoms, decide if any other testing would be helpful.  Baltazar Apo, MD, PhD 04/16/2018, 11:55 AM [facility] [phone] or if no answer [phone]

## 2018-04-16 NOTE — Patient Instructions (Addendum)
Your pulmonary function testing today is normal.  This is good news. Please continue to do your daily exercise Follow with Dr. Lamonte Sakai in 6 months and we will review your symptoms, decide if any other testing would be helpful.

## 2018-04-16 NOTE — Progress Notes (Signed)
PFT done today. 

## 2018-04-20 ENCOUNTER — Encounter: Payer: Self-pay | Admitting: Internal Medicine

## 2018-04-21 NOTE — Telephone Encounter (Signed)
Please advise Dr Jerilee Hoh. (please reply to [facility])

## 2018-05-12 ENCOUNTER — Ambulatory Visit (INDEPENDENT_AMBULATORY_CARE_PROVIDER_SITE_OTHER): Payer: BLUE CROSS/BLUE SHIELD | Admitting: *Deleted

## 2018-05-12 DIAGNOSIS — I255 Ischemic cardiomyopathy: Secondary | ICD-10-CM

## 2018-05-12 DIAGNOSIS — I5042 Chronic combined systolic (congestive) and diastolic (congestive) heart failure: Secondary | ICD-10-CM

## 2018-05-12 LAB — CUP PACEART REMOTE DEVICE CHECK
Battery Remaining Longevity: 49 mo
Battery Remaining Percentage: 63 %
Battery Voltage: 2.9 V
Brady Statistic AP VS Percent: 1 %
Brady Statistic AS VP Percent: 99 %
Brady Statistic AS VS Percent: 1 %
Implantable Lead Implant Date: 20140721
Implantable Lead Implant Date: 20141117
Implantable Lead Implant Date: 20141117
Implantable Lead Location: 753859
Implantable Lead Location: 753860
Implantable Lead Model: 5071
Implantable Pulse Generator Implant Date: 20141117
Lead Channel Impedance Value: 400 Ohm
Lead Channel Impedance Value: 440 Ohm
Lead Channel Impedance Value: 550 Ohm
Lead Channel Pacing Threshold Amplitude: 0.75 V
Lead Channel Pacing Threshold Amplitude: 0.75 V
Lead Channel Pacing Threshold Amplitude: 1.5 V
Lead Channel Pacing Threshold Pulse Width: 0.4 ms
Lead Channel Pacing Threshold Pulse Width: 0.7 ms
Lead Channel Sensing Intrinsic Amplitude: 1.1 mV
Lead Channel Sensing Intrinsic Amplitude: 12 mV
Lead Channel Setting Pacing Amplitude: 2 V
Lead Channel Setting Pacing Amplitude: 2.5 V
Lead Channel Setting Pacing Amplitude: 2.5 V
Lead Channel Setting Pacing Pulse Width: 0.4 ms
Lead Channel Setting Pacing Pulse Width: 0.7 ms
MDC IDC LEAD LOCATION: 753858
MDC IDC MSMT LEADCHNL RA PACING THRESHOLD PULSEWIDTH: 0.4 ms
MDC IDC SESS DTM: 20200303070015
MDC IDC SET LEADCHNL RV SENSING SENSITIVITY: 2 mV
MDC IDC STAT BRADY AP VP PERCENT: 1 %
MDC IDC STAT BRADY RA PERCENT PACED: 1 %
Pulse Gen Model: 3222
Pulse Gen Serial Number: 2981305

## 2018-05-19 NOTE — Progress Notes (Signed)
Remote pacemaker transmission.   

## 2018-06-03 ENCOUNTER — Other Ambulatory Visit: Payer: Self-pay | Admitting: Cardiovascular Disease

## 2018-06-04 ENCOUNTER — Other Ambulatory Visit: Payer: Self-pay

## 2018-06-04 MED ORDER — RIVAROXABAN 20 MG PO TABS: 20.0000 mg | ORAL_TABLET | Freq: Every day | ORAL | 1 refills | Status: DC

## 2018-07-12 ENCOUNTER — Encounter: Payer: Self-pay | Admitting: Internal Medicine

## 2018-07-14 ENCOUNTER — Other Ambulatory Visit: Payer: Self-pay

## 2018-07-14 ENCOUNTER — Ambulatory Visit (INDEPENDENT_AMBULATORY_CARE_PROVIDER_SITE_OTHER): Payer: BLUE CROSS/BLUE SHIELD | Admitting: Internal Medicine

## 2018-07-14 DIAGNOSIS — R1032 Left lower quadrant pain: Secondary | ICD-10-CM

## 2018-07-14 MED ORDER — CYCLOBENZAPRINE HCL 10 MG PO TABS: 10.0000 mg | ORAL_TABLET | Freq: Two times a day (BID) | ORAL | 0 refills | Status: DC | PRN

## 2018-07-14 NOTE — Progress Notes (Signed)
Virtual Visit via Video Note  I connected with [name] on 07/14/18 at  9:00 AM EDT by a video enabled telemedicine application and verified that I am speaking with the correct person using two identifiers.  Location patient: home Location provider: work office Persons participating in the virtual visit: patient, provider  I discussed the limitations of evaluation and management by telemedicine and the availability of in person appointments. The patient expressed understanding and agreed to proceed.   HPI: [name] has made this acute visit due to acute left groin pain.  She tells me that on Tuesday evening she was playing with her grandson in the yard and felt a twinge in that area.  When she woke up the next morning she had significant pain in her left groin that radiates sideways into her hip.  She states the pain is worse with flexing of the hip such as bending down to pick something off the floor or climbing up steps.  She has not noticed any lumps in her groin.   ROS: Constitutional: Denies fever, chills, diaphoresis, appetite change and fatigue.  HEENT: Denies photophobia, eye pain, redness, hearing loss, ear pain, congestion, sore throat, rhinorrhea, sneezing, mouth sores, trouble swallowing, neck pain, neck stiffness and tinnitus.   Respiratory: Denies SOB, DOE, cough, chest tightness,  and wheezing.   Cardiovascular: Denies chest pain, palpitations and leg swelling.  Gastrointestinal: Denies nausea, vomiting, abdominal pain, diarrhea, constipation, blood in stool and abdominal distention.  Genitourinary: Denies dysuria, urgency, frequency, hematuria, flank pain and difficulty urinating.  Endocrine: Denies: hot or cold intolerance, sweats, changes in hair or nails, polyuria, polydipsia. Musculoskeletal: Denies myalgias, back pain, joint swelling, arthralgias Skin: Denies pallor, rash and wound.  Neurological: Denies dizziness, seizures, syncope, weakness, light-headedness,  numbness and headaches.  Hematological: Denies adenopathy. Easy bruising, personal or family bleeding history  Psychiatric/Behavioral: Denies suicidal ideation, mood changes, confusion, nervousness, sleep disturbance and agitation   Past Medical History:  Diagnosis Date  . AICD (automatic cardioverter/defibrillator) present 01/2013   Biventricular cardiac pacemaker in situ   . Allergic rhinitis   . CAD (coronary artery disease)    a. 2004: s/p MI in [place]. No PCI->Medical RX;  b. 07/2012 Cath: LM 30-40, LAD 70p, 70/54m D1 80-90p, OM1 small 90p, OM2 large 80-90p, 528m70-80d, RCA 20-30 diff, EF 40%, glob HK.s/p CABG  . [facility]   Patient reports left breast cancer diagnosis in 2002 treated with bilateral mastectomy positive lymph nodes with left axillary dissection followed by chemotherapy of unknown type  . Cataract   . Diabetes mellitus type II   . Exertional shortness of breath   . Hyperlipidemia   . Hypertension   . Ischemic cardiomyopathy    a. 07/2012 Echo: EF 35%, Sev inferoseptal HK, mildly dil LA, Peak PASP 5960m.  . LMarland KitchenBB (left bundle branch block)    a. intermittent - present during rapid afib 07/2012.  . NMarland Kitchenuromuscular disorder ([place]  Patient reports chronic numbness in the right foot related to previous surgery on the right leg and "nerve damage"  . PAF (paroxysmal atrial fibrillation) ([facility]  a. 07/2012: Amio and xarelto initiated.  . SBO (small bowel obstruction) ([facility]   Past Surgical History:  Procedure Laterality Date  . ABDOMINAL HYSTERECTOMY  2000  . APPENDECTOMY  1974  . BI-VENTRICULAR PACEMAKER INSERTION N/A 01/25/2013   Procedure: BI-VENTRICULAR PACEMAKER INSERTION (CRT-P);  Surgeon: GreEvans LanceD;  [facility];  Service: Cardiovascular;  Laterality: N/A;  . BREAST BIOPSY Left 2002  . CARDIAC CATHETERIZATION    . CHOLECYSTECTOMY OPEN  1974  . CORONARY ARTERY BYPASS GRAFT N/A 09/28/2012   Procedure: CORONARY ARTERY  BYPASS GRAFTING (CABG);  Surgeon: [name], MD;  Location: [place];  Service: Open Heart Surgery;  Laterality: N/A;  CABG x four, using left internal mammary artery and left leg greater saphenous vein harvested endoscopically  . DILATION AND CURETTAGE OF UTERUS  1983  . EPICARDIAL PACING LEAD PLACEMENT N/A 09/28/2012   Procedure: EPICARDIAL PACING LEAD PLACEMENT;  Surgeon: [name], MD;  Location: [place];  Service: Thoracic;  Laterality: N/A;  LV LEAD PLACEMENT  . HERNIA REPAIR    . INCISIONAL HERNIA REPAIR N/A 05/25/2015   Procedure: LAPAROSCOPIC INCISIONAL HERNIA WITH MESH ;  Surgeon: [name], MD;  Location: [place];  Service: General;  Laterality: N/A;  . INCONTINENCE SURGERY  2000  . INSERTION OF MESH N/A 05/25/2015   Procedure: INSERTION OF MESH;  Surgeon: [name], MD;  Location: [place];  Service: General;  Laterality: N/A;  . INTRAOPERATIVE TRANSESOPHAGEAL ECHOCARDIOGRAM N/A 09/28/2012   Procedure: INTRAOPERATIVE TRANSESOPHAGEAL ECHOCARDIOGRAM;  Surgeon: [name], MD;  Location: [place];  Service: Open Heart Surgery;  Laterality: N/A;  . LAPAROSCOPIC INCISIONAL / UMBILICAL / VENTRAL HERNIA REPAIR  05/25/2015   IHR  . LEFT HEART CATHETERIZATION WITH CORONARY ANGIOGRAM N/A 07/20/2012   Procedure: LEFT HEART CATHETERIZATION WITH CORONARY ANGIOGRAM;  Surgeon: [name], MD;  [facility] LAB;  Service: Cardiovascular;  Laterality: N/A;  . MASTECTOMY Right 2002  . MASTECTOMY MODIFIED RADICAL W/ AXILLARY LYMPH NODES W/ OR W/O PECTORALIS MINOR Left 2002  . MAZE N/A 09/28/2012   Procedure: MAZE;  Surgeon: [name], MD;  Location: [place];  Service: Open Heart Surgery;  Laterality: N/A;  . ORIF WRIST FRACTURE Right 11/08/2017   Procedure: OPEN REDUCTION INTERNAL FIXATION (ORIF) WRIST FRACTURE;  Surgeon: [name], MD;  Location: [place];  Service: Orthopedics;  Laterality: Right;  90 mins  . TENDON REPAIR Right 2001 X 3-4   torn ligaments and  tendons in ankle up to knee from work related accident  . TUBAL LIGATION  1987    Family History  Problem Relation Age of Onset  . Kidney failure Mother   . Heart disease Mother        Died mid 82X complications of diabetes  . Colon cancer Sister   . Arthritis Other   . Cancer Other        ovarian  . Diabetes Other   . Hyperlipidemia Other   . Liver cancer Brother   . Stomach cancer Neg Hx   . Rectal cancer Neg Hx   . Esophageal cancer Neg Hx     SOCIAL HX:   reports that she quit smoking about 6 years ago. Her smoking use included cigarettes. She has a 10.00 pack-year smoking history. She has never used smokeless tobacco. She reports that she does not drink alcohol or use drugs.   Current Outpatient Medications:  .  albuterol (PROVENTIL HFA;VENTOLIN HFA) 108 (90 Base) MCG/ACT inhaler, Inhale 2 puffs into the lungs every 6 (six) hours as needed for wheezing or shortness of breath. (Patient not taking: Reported on 04/16/2018), Disp: 1 Inhaler, Rfl: 0 .  BAYER MICROLET LANCETS lancets, Use as instructed, Disp: 100 each, Rfl: 2 .  Blood Glucose Monitoring Suppl (BAYER CONTOUR NEXT LINK) w/Device KIT, Use to check  blood sugar 2 times per day., Disp: 1 kit, Rfl: 2 .  canagliflozin (INVOKANA) 100 MG TABS tablet, Take 1 tablet (100 mg total) by mouth every morning., Disp: 30 tablet, Rfl: 11 .  carvedilol (COREG) 25 MG tablet, TAKE ONE-HALF TABLET BY MOUTH TWO TIMES A DAY *NEW LOWER DOSE, Disp: 90 tablet, Rfl: 1 .  cyclobenzaprine (FLEXERIL) 10 MG tablet, Take 1 tablet (10 mg total) by mouth 2 (two) times daily as needed for muscle spasms., Disp: 30 tablet, Rfl: 0 .  Dulaglutide (TRULICITY) 1.5 FG/9.0SX SOPN, INJECT 1.5 MG INTO THE SKIN ONCE A WEEK., Disp: 4 pen, Rfl: 11 .  Evolocumab (REPATHA SURECLICK) 115 MG/ML SOAJ, Inject 140 mg into the skin every 14 (fourteen) days., Disp: 2 pen, Rfl: 11 .  fluocinonide ointment (LIDEX) 5.20 %, Apply 1 application topically 2 (two) times daily.  (Patient taking differently: Apply 1 application topically 2 (two) times daily as needed. ), Disp: 30 g, Rfl: 1 .  furosemide (LASIX) 20 MG tablet, Take 1 tablet (20 mg total) by mouth daily., Disp: 90 tablet, Rfl: 3 .  glipiZIDE (GLUCOTROL) 5 MG tablet, TAKE ONE TABLET BY MOUTH TWICE A DAY BEFORE A MEAL, Disp: 200 tablet, Rfl: 4 .  glucose blood (CONTOUR NEXT TEST) test strip, Use as instructed to check sugar 2 times daily, Disp: 200 each, Rfl: 5 .  losartan (COZAAR) 25 MG tablet, TAKE ONE TABLET BY MOUTH TWICE A DAY, Disp: 180 tablet, Rfl: 1 .  metFORMIN (GLUCOPHAGE) 1000 MG tablet, TAKE ONE TABLET BY MOUTH TWICE A DAY WITH FOOD, Disp: 200 tablet, Rfl: 4 .  nitroGLYCERIN (NITROSTAT) 0.4 MG SL tablet, Place 1 tablet (0.4 mg total) under the tongue every 5 (five) minutes as needed for chest pain., Disp: 25 tablet, Rfl: 3 .  rivaroxaban (XARELTO) 20 MG TABS tablet, Take 1 tablet (20 mg total) by mouth daily with supper., Disp: 90 tablet, Rfl: 1  EXAM:   VITALS per patient if applicable: None reported  GENERAL: alert, oriented, appears well and in no acute distress  HEENT: atraumatic, conjunttiva clear, no obvious abnormalities on inspection of external nose and ears  NECK: normal movements of the head and neck  LUNGS: on inspection no signs of respiratory distress, breathing rate appears normal, no obvious gross increased work of breathing, gasping or wheezing  CV: no obvious cyanosis  MS: moves all visible extremities without noticeable abnormality  PSYCH/NEURO: pleasant and cooperative, no obvious depression or anxiety, speech and thought processing grossly intact  ASSESSMENT AND PLAN:   Left groin pain  -Suspect acute muscle injury. -Have advised icing, use of ibuprofen, Flexeril 10 mg that she may use up to twice daily as needed. -She knows to contact us if no improvement in around 10 to 14 days.     I discussed the assessment and treatment plan with the patient. The patient  was provided an opportunity to ask questions and all were answered. The patient agreed with the plan and demonstrated an understanding of the instructions.   The patient was advised to call back or seek an in-person evaluation if the symptoms worsen or if the condition fails to improve as anticipated.    Michelle Frohlich, MD  [facility]

## 2018-08-04 ENCOUNTER — Ambulatory Visit: Payer: BLUE CROSS/BLUE SHIELD | Admitting: Internal Medicine

## 2018-08-11 ENCOUNTER — Ambulatory Visit (INDEPENDENT_AMBULATORY_CARE_PROVIDER_SITE_OTHER): Payer: BC Managed Care – PPO | Admitting: *Deleted

## 2018-08-11 DIAGNOSIS — I255 Ischemic cardiomyopathy: Secondary | ICD-10-CM

## 2018-08-12 LAB — CUP PACEART REMOTE DEVICE CHECK
Battery Remaining Longevity: 49 mo
Battery Remaining Percentage: 63 %
Battery Voltage: 2.9 V
Brady Statistic AP VP Percent: 1 %
Brady Statistic AP VS Percent: 1 %
Brady Statistic AS VP Percent: 99 %
Brady Statistic AS VS Percent: 1 %
Brady Statistic RA Percent Paced: 1 %
Date Time Interrogation Session: 20200602061847
Implantable Lead Implant Date: 20140721
Implantable Lead Implant Date: 20141117
Implantable Lead Implant Date: 20141117
Implantable Lead Location: 753858
Implantable Lead Location: 753859
Implantable Lead Location: 753860
Implantable Lead Model: 5071
Implantable Pulse Generator Implant Date: 20141117
Lead Channel Impedance Value: 400 Ohm
Lead Channel Impedance Value: 460 Ohm
Lead Channel Impedance Value: 560 Ohm
Lead Channel Pacing Threshold Amplitude: 0.75 V
Lead Channel Pacing Threshold Amplitude: 0.75 V
Lead Channel Pacing Threshold Amplitude: 1.5 V
Lead Channel Pacing Threshold Pulse Width: 0.4 ms
Lead Channel Pacing Threshold Pulse Width: 0.4 ms
Lead Channel Pacing Threshold Pulse Width: 0.7 ms
Lead Channel Sensing Intrinsic Amplitude: 1.6 mV
Lead Channel Sensing Intrinsic Amplitude: 12 mV
Lead Channel Setting Pacing Amplitude: 2 V
Lead Channel Setting Pacing Amplitude: 2.5 V
Lead Channel Setting Pacing Amplitude: 2.5 V
Lead Channel Setting Pacing Pulse Width: 0.4 ms
Lead Channel Setting Pacing Pulse Width: 0.7 ms
Lead Channel Setting Sensing Sensitivity: 2 mV
Pulse Gen Model: 3222
Pulse Gen Serial Number: 2981305

## 2018-08-19 ENCOUNTER — Encounter: Payer: Self-pay | Admitting: Cardiology

## 2018-08-19 NOTE — Progress Notes (Signed)
Remote pacemaker transmission.   

## 2018-10-31 ENCOUNTER — Other Ambulatory Visit: Payer: Self-pay | Admitting: Cardiovascular Disease

## 2018-10-31 DIAGNOSIS — I10 Essential (primary) hypertension: Secondary | ICD-10-CM

## 2018-11-09 ENCOUNTER — Encounter: Payer: Self-pay | Admitting: Cardiovascular Disease

## 2018-11-09 ENCOUNTER — Ambulatory Visit (INDEPENDENT_AMBULATORY_CARE_PROVIDER_SITE_OTHER): Payer: BC Managed Care – PPO | Admitting: Cardiovascular Disease

## 2018-11-09 ENCOUNTER — Other Ambulatory Visit: Payer: Self-pay

## 2018-11-09 VITALS — BP 110/80 | HR 85 | Temp 97.5°F | Ht 63.0 in | Wt 155.4 lb

## 2018-11-09 DIAGNOSIS — E1159 Type 2 diabetes mellitus with other circulatory complications: Secondary | ICD-10-CM | POA: Diagnosis not present

## 2018-11-09 DIAGNOSIS — I2510 Atherosclerotic heart disease of native coronary artery without angina pectoris: Secondary | ICD-10-CM | POA: Diagnosis not present

## 2018-11-09 DIAGNOSIS — E782 Mixed hyperlipidemia: Secondary | ICD-10-CM

## 2018-11-09 DIAGNOSIS — I10 Essential (primary) hypertension: Secondary | ICD-10-CM | POA: Diagnosis not present

## 2018-11-09 DIAGNOSIS — I480 Paroxysmal atrial fibrillation: Secondary | ICD-10-CM

## 2018-11-09 DIAGNOSIS — I5042 Chronic combined systolic (congestive) and diastolic (congestive) heart failure: Secondary | ICD-10-CM

## 2018-11-09 DIAGNOSIS — Z950 Presence of cardiac pacemaker: Secondary | ICD-10-CM

## 2018-11-09 DIAGNOSIS — Z7901 Long term (current) use of anticoagulants: Secondary | ICD-10-CM

## 2018-11-09 NOTE — Patient Instructions (Signed)
Medication Instructions:  Your physician recommends that you continue on your current medications as directed. Please refer to the Current Medication list given to you today.  If you need a refill on your cardiac medications before your next appointment, please call your pharmacy.   Lab work: Your provider would like for you to return in the next few weeks to have the following labs drawn: FASTING Lipid,CBC, CMET, TSH, A1C and Vitamin D. You do not need an appointment for the lab. Once in our office lobby there is a podium where you can sign in and ring the doorbell to alert [place] that you are here. The lab is open from 8:00 am to 4:30 pm; closed for lunch from 12:45pm-1:45pm.  If you have labs (blood work) drawn today and your tests are completely normal, you will receive your results only by: [place] (if you have MyChart) OR A paper copy in the mail If you have any lab test that is abnormal or we need to change your treatment, we will call you to review the results.  Testing/Procedures: None ordered  Follow-Up: At [facility], you and your health needs are our priority.  As part of our continuing mission to provide you with exceptional heart care, we have created designated Provider Care Teams.  These Care Teams include your primary Cardiologist (physician) and Advanced Practice Providers (APPs -  Physician Assistants and Nurse Practitioners) who all work together to provide you with the care you need, when you need it. You will need a follow up appointment in 12 months.  Please call our office 2 months in advance to schedule this appointment.  You may see [name], MD or one of the following Advanced Practice Providers on your designated Care Team: Almyra Deforest, PA-C Fabian Sharp, [place]

## 2018-11-09 NOTE — Progress Notes (Signed)
Cardiology Office Note:    Date:  11/10/2018   ID:  [name], DOB 07-Aug-1950, MRN [id number]  PCP:  [name], Michelle Halsted, MD  Cardiologist:  Michelle Klein, MD    Referring MD: [name], Estel*   No chief complaint on file. Follow-up coronary artery disease, CRT-P  History of Present Illness:    [name] is a 68 y.o. female with a hx of CAD s/p CABG 2014, paroxysmal atrial fibrillation, CHF with recovery of EF after CRT-P (St. Jude), type 2 diabetes mellitus, hypercholesterolemia, hypertension returning for follow-up.  She had a very good year without health problems, but is suffering due to social isolation. She misses seeing her grandkids in [place].  The patient specifically denies any chest pain at rest exertion, dyspnea at rest or with exertion, orthopnea, paroxysmal nocturnal dyspnea, syncope, palpitations, focal neurological deficits, intermittent claudication, lower extremity edema, unexplained weight gain, cough, hemoptysis or wheezing.  Diabetes control has improved.  Her last hemoglobin A1c was 6.4%.  She has not had a repeat lipid profile since last April 2019 due to the coronavirus pandemic.  When last checked however her LDL was excellent on Repatha (LDL 25, HDL 61).  Device interrogation shows normal function.  She has approximately two-point-3.5 years of estimated generator longevity.  She has greater than 99% biventricular pacing and never requires atrial pacing.  Heart rate histogram is good.  She has had a handful of brief episodes of paroxysmal atrial tachycardia and one episode that is clearly atrial fibrillation that lasted for only 8 seconds.  Thoracic impedance has been stable without evidence of fluid overload.  [name] presented with myocardial infarction in 2004. In May 2014 presented with congestive heart failure and atrial fibrillation in the setting of three-vessel coronary artery disease leading to bypass surgery (July 2014, Dr.  Servando Snare, LIMA to LAD, SVG to ramus intermedius, sequential SVG to OM1 and OM 2, surgical maze procedure). She had persistent depressed left ventricular systolic function and received a dual-chamber biventricular pacemaker (St. Jude Allure, Dr. Lovena Le in November 2014).   Last echo February 2016 shows recovery of left ventricular ejection fraction 50-55%. In January 2015, she was admitted with acute on chronic heart failure due to excessive intravenous fluid administration during an episode of small bowel obstruction.. She has type 2 diabetes mellitus that has been difficult to control as well as hypertension and hyperlipidemia.  Past Medical History:  Diagnosis Date  . AICD (automatic cardioverter/defibrillator) present 01/2013   Biventricular cardiac pacemaker in situ   . Allergic rhinitis   . CAD (coronary artery disease)    a. 2004: s/p MI in [place]. No PCI->Medical RX;  b. 07/2012 Cath: LM 30-40, LAD 70p, 70/48m D1 80-90p, OM1 small 90p, OM2 large 80-90p, 519m70-80d, RCA 20-30 diff, EF 40%, glob HK.s/p CABG  . [facility]   Patient reports left breast cancer diagnosis in 2002 treated with bilateral mastectomy positive lymph nodes with left axillary dissection followed by chemotherapy of unknown type  . Cataract   . Diabetes mellitus type II   . Exertional shortness of breath   . Hyperlipidemia   . Hypertension   . Ischemic cardiomyopathy    a. 07/2012 Echo: EF 35%, Sev inferoseptal HK, mildly dil LA, Peak PASP 5929m.  . LMarland KitchenBB (left bundle branch block)    a. intermittent - present during rapid afib 07/2012.  . [facility]  Patient reports chronic numbness in the right foot related to  previous surgery on the right leg and "nerve damage"  . PAF (paroxysmal atrial fibrillation) ([place])    a. 07/2012: Amio and xarelto initiated.  . SBO (small bowel obstruction) ([place])     Past Surgical History:  Procedure Laterality Date  . ABDOMINAL HYSTERECTOMY   2000  . APPENDECTOMY  1974  . BI-VENTRICULAR PACEMAKER INSERTION N/A 01/25/2013   Procedure: BI-VENTRICULAR PACEMAKER INSERTION (CRT-P);  Surgeon: Evans Lance, MD;  [facility] C[facility] LAB;  Service: Cardiovascular;  Laterality: N/A;  . BREAST BIOPSY Left 2002  . CARDIAC CATHETERIZATION    . CHOLECYSTECTOMY OPEN  1974  . CORONARY ARTERY BYPASS GRAFT N/A 09/28/2012   Procedure: CORONARY ARTERY BYPASS GRAFTING (CABG);  Surgeon: [name], MD;  Location: [place];  Service: Open Heart Surgery;  Laterality: N/A;  CABG x four, using left internal mammary artery and left leg greater saphenous vein harvested endoscopically  . DILATION AND CURETTAGE OF UTERUS  1983  . EPICARDIAL PACING LEAD PLACEMENT N/A 09/28/2012   Procedure: EPICARDIAL PACING LEAD PLACEMENT;  Surgeon: [name], MD;  Location: [place];  Service: Thoracic;  Laterality: N/A;  LV LEAD PLACEMENT  . HERNIA REPAIR    . INCISIONAL HERNIA REPAIR N/A 05/25/2015   Procedure: LAPAROSCOPIC INCISIONAL HERNIA WITH MESH ;  Surgeon: [name], MD;  Location: [place];  Service: General;  Laterality: N/A;  . INCONTINENCE SURGERY  2000  . INSERTION OF MESH N/A 05/25/2015   Procedure: INSERTION OF MESH;  Surgeon: [name], MD;  Location: [place];  Service: General;  Laterality: N/A;  . INTRAOPERATIVE TRANSESOPHAGEAL ECHOCARDIOGRAM N/A 09/28/2012   Procedure: INTRAOPERATIVE TRANSESOPHAGEAL ECHOCARDIOGRAM;  Surgeon: [name], MD;  Location: [place];  Service: Open Heart Surgery;  Laterality: N/A;  . LAPAROSCOPIC INCISIONAL / UMBILICAL / VENTRAL HERNIA REPAIR  05/25/2015   IHR  . LEFT HEART CATHETERIZATION WITH CORONARY ANGIOGRAM N/A 07/20/2012   Procedure: LEFT HEART CATHETERIZATION WITH CORONARY ANGIOGRAM;  Surgeon: [name], MD;  Location[facility] C[facility] LAB;  Service: Cardiovascular;  Laterality: N/A;  . MASTECTOMY Right 2002  . MASTECTOMY MODIFIED RADICAL W/ AXILLARY LYMPH NODES W/ OR W/O PECTORALIS MINOR Left 2002  . MAZE  N/A 09/28/2012   Procedure: MAZE;  Surgeon: [name], MD;  Location: [place];  Service: Open Heart Surgery;  Laterality: N/A;  . ORIF WRIST FRACTURE Right 11/08/2017   Procedure: OPEN REDUCTION INTERNAL FIXATION (ORIF) WRIST FRACTURE;  Surgeon: [name], MD;  Location: [place];  Service: Orthopedics;  Laterality: Right;  90 mins  . TENDON REPAIR Right 2001 X 3-4   torn ligaments and tendons in ankle up to knee from work related accident  . TUBAL LIGATION  1987    Current Medications: Current Meds  Medication Sig  . BAYER MICROLET LANCETS lancets Use as instructed  . Blood Glucose Monitoring Suppl (BAYER CONTOUR NEXT LINK) w/Device KIT Use to check blood sugar 2 times per day.  . canagliflozin (INVOKANA) 100 MG TABS tablet Take 1 tablet (100 mg total) by mouth every morning.  . carvedilol (COREG) 25 MG tablet TAKE ONE-HALF TABLET BY MOUTH TWO TIMES A DAY *NEW LOWER DOSE  . Dulaglutide (TRULICITY) 1.5 JQ/7.3AL SOPN INJECT 1.5 MG INTO THE SKIN ONCE A WEEK.  . Evolocumab (REPATHA SURECLICK) 937 MG/ML SOAJ Inject 140 mg into the skin every 14 (fourteen) days.  . furosemide (LASIX) 20 MG tablet TAKE 1 TABLET(20 MG) BY MOUTH DAILY  . glipiZIDE (GLUCOTROL) 5 MG tablet TAKE ONE TABLET  BY MOUTH TWICE A DAY BEFORE A MEAL  . glucose blood (CONTOUR NEXT TEST) test strip Use as instructed to check sugar 2 times daily  . losartan (COZAAR) 25 MG tablet TAKE ONE TABLET BY MOUTH TWICE A DAY  . metFORMIN (GLUCOPHAGE) 1000 MG tablet TAKE ONE TABLET BY MOUTH TWICE A DAY WITH FOOD  . nitroGLYCERIN (NITROSTAT) 0.4 MG SL tablet Place 1 tablet (0.4 mg total) under the tongue every 5 (five) minutes as needed for chest pain.  . rivaroxaban (XARELTO) 20 MG TABS tablet Take 1 tablet (20 mg total) by mouth daily with supper.     Allergies:   Statins   Social History   Socioeconomic History  . Marital status: Married    Spouse name: Not on file  . Number of children: 3  . Years of education: Not on  file  . Highest education level: Not on file  Occupational History  . Occupation: Marketing executive work    Fish farm manager: [employer]. [employer]  Social Needs  . Financial resource strain: Not on file  . Food insecurity    Worry: Not on file    Inability: Not on file  . Transportation needs    Medical: Not on file    Non-medical: Not on file  Tobacco Use  . Smoking status: Former Smoker    Packs/day: 1.00    Years: 10.00    Pack years: 10.00    Types: Cigarettes    Quit date: 03/11/2012    Years since quitting: 6.6  . Smokeless tobacco: Never Used  Substance and Sexual Activity  . Alcohol use: No  . Drug use: No  . Sexual activity: Yes    Partners: Female    Birth control/protection: Surgical  Lifestyle  . Physical activity    Days per week: Not on file    Minutes per session: Not on file  . Stress: Not on file  Relationships  . Social Herbalist on phone: Not on file    Gets together: Not on file    Attends religious service: Not on file    Active member of club or organization: Not on file    Attends meetings of clubs or organizations: Not on file    Relationship status: Not on file  Other Topics Concern  . Not on file  Social History Narrative   Lives with husband and mother in law.     Regular exercise: walking   Caffeine use: 2 cups of coffee in the morning     Family History: The patient's family history includes Arthritis in an other family member; Cancer in an other family member; Colon cancer in her sister; Diabetes in an other family member; Heart disease in her mother; Hyperlipidemia in an other family member; Kidney failure in her mother; Liver cancer in her brother. There is no history of Stomach cancer, Rectal cancer, or Esophageal cancer. ROS:   Please see the history of present illness.    All other systems are reviewed and are negative  EKGs/Labs/Other Studies Reviewed:    EKG:  EKG is  ordered today.  ECG ordered today shows atrial sensed  biventricular paced rhythm with a remarkably narrow QRS of only 128 ms and positive R waves in lead V1, QTC 525 ms  Recent Labs: 01/02/2018: BUN 15; Creatinine, Ser 0.70; Hemoglobin 13.2; Platelets 227; Potassium 4.4; Sodium 140  Recent Lipid Panel    Component Value Date/Time   CHOL 116 06/26/2017 0903   TRIG 151 (  H) 06/26/2017 0903   HDL 61 06/26/2017 0903   CHOLHDL 1.9 06/26/2017 0903   CHOLHDL 5.2 (H) 04/04/2016 0723   VLDL NOT CALC 04/04/2016 0723   LDLCALC 25 06/26/2017 0903   LDLDIRECT 50 04/04/2016 0723    Physical Exam:    VS:  BP 110/80   Pulse 85   Temp (!) 97.5 F (36.4 C)   Ht 5' 3"  (1.6 m)   Wt 155 lb 6.4 oz (70.5 kg)   BMI 27.53 kg/m     Wt Readings from Last 3 Encounters:  11/09/18 155 lb 6.4 oz (70.5 kg)  04/16/18 153 lb (69.4 kg)  03/31/18 150 lb (68 kg)      General: Alert, oriented x3, no distress, appears well, minimally overweight Head: no evidence of trauma, PERRL, EOMI, no exophtalmos or lid lag, no myxedema, no xanthelasma; normal ears, nose and oropharynx Neck: normal jugular venous pulsations and no hepatojugular reflux; brisk carotid pulses without delay and no carotid bruits Chest: clear to auscultation, no signs of consolidation by percussion or palpation, normal fremitus, symmetrical and full respiratory excursions Cardiovascular: normal position and quality of the apical impulse, regular rhythm, normal first and second heart sounds, no murmurs, rubs or gallops Abdomen: no tenderness or distention, no masses by palpation, no abnormal pulsatility or arterial bruits, normal bowel sounds, no hepatosplenomegaly Extremities: no clubbing, cyanosis or edema; 2+ radial, ulnar and brachial pulses bilaterally; 2+ right femoral, posterior tibial and dorsalis pedis pulses; 2+ left femoral, posterior tibial and dorsalis pedis pulses; no subclavian or femoral bruits Neurological: grossly nonfocal Psych: Normal mood and affect   ASSESSMENT:    1.  Chronic combined systolic and diastolic heart failure ([place])   2. Coronary artery disease involving native coronary artery of native heart without angina pectoris   3. Type 2 diabetes mellitus with other circulatory complication, without long-term current use of insulin (HCC)   4. Paroxysmal atrial fibrillation ([place])   5. Anticoagulant long-term use   6. Mixed hyperlipidemia   7. Essential hypertension   8. Status post biventricular pacemaker    PLAN:    In order of problems listed above:  1. CHF: Excellent response to resynchronization therapy.  NYHA functional class I, euvolemic.  Prior to CRT-P LVEF was 35-40% and she had a lot of problems with heart failure exacerbation, following resynchronization therapy EF was 50-55% and she has not had problems with heart failure in a very long time. She is on an angiotensin receptor blocker and carvedilol.  On a very low-dose of loop diuretic. 2. CAD s/p CABG: No angina despite active lifestyle. 3. DM: Improved glycemic control compared to last year.  It is been about 6 months and it is worth rechecking her A1c now. 4. AFib:  She did have a MAZE procedure at the time of bypass.  Although sustained atrial fibrillation has not been documented, she does have occasional brief episodes of clear-cut atrial fibrillation recorded by her device, likely to have future recurrence.  Continue anticoagulation.. CHADSVasc 5 (age, gender, hypertension, CHF, CAD). 5. Xarelto: No bleeding complications. 6. HLP: Statin intolerant due to severe myalgia, excellent response to Repatha.  Recheck labs today.  We will also get all her other routine labs since she has had difficulty getting other medical appointments recently. 7. HTN: Excellent control, no symptoms of hypotension 8. CRT-P: Continue remote downloads every 3 months and yearly office visit.  . Medication Adjustments/Labs and Tests Ordered: Current medicines are reviewed at length with the patient today.  Concerns  regarding medicines are outlined above.  Orders Placed This Encounter  Procedures  . CBC  . Comprehensive metabolic panel  . Hemoglobin A1c  . Lipid panel  . Vitamin D (25 hydroxy)  . TSH  . EKG 12-Lead   No orders of the defined types were placed in this encounter.   Signed, Michelle Klein, MD  11/10/2018 11:19 AM    [facility]

## 2018-11-10 ENCOUNTER — Ambulatory Visit (INDEPENDENT_AMBULATORY_CARE_PROVIDER_SITE_OTHER): Payer: BC Managed Care – PPO | Admitting: *Deleted

## 2018-11-10 DIAGNOSIS — I480 Paroxysmal atrial fibrillation: Secondary | ICD-10-CM

## 2018-11-10 DIAGNOSIS — I255 Ischemic cardiomyopathy: Secondary | ICD-10-CM

## 2018-11-10 LAB — CUP PACEART REMOTE DEVICE CHECK
Battery Remaining Longevity: 38 mo
Battery Remaining Percentage: 50 %
Battery Voltage: 2.87 V
Brady Statistic AP VP Percent: 1 %
Brady Statistic AP VS Percent: 1 %
Brady Statistic AS VP Percent: 99 %
Brady Statistic AS VS Percent: 1 %
Brady Statistic RA Percent Paced: 1 %
Date Time Interrogation Session: 20200901060013
Implantable Lead Implant Date: 20140721
Implantable Lead Implant Date: 20141117
Implantable Lead Implant Date: 20141117
Implantable Lead Location: 753858
Implantable Lead Location: 753859
Implantable Lead Location: 753860
Implantable Lead Model: 5071
Implantable Pulse Generator Implant Date: 20141117
Lead Channel Impedance Value: 400 Ohm
Lead Channel Impedance Value: 440 Ohm
Lead Channel Impedance Value: 590 Ohm
Lead Channel Pacing Threshold Amplitude: 0.5 V
Lead Channel Pacing Threshold Amplitude: 0.75 V
Lead Channel Pacing Threshold Amplitude: 2.25 V
Lead Channel Pacing Threshold Pulse Width: 0.4 ms
Lead Channel Pacing Threshold Pulse Width: 0.4 ms
Lead Channel Pacing Threshold Pulse Width: 0.7 ms
Lead Channel Sensing Intrinsic Amplitude: 1.4 mV
Lead Channel Sensing Intrinsic Amplitude: 12 mV
Lead Channel Setting Pacing Amplitude: 2 V
Lead Channel Setting Pacing Amplitude: 2.5 V
Lead Channel Setting Pacing Amplitude: 2.5 V
Lead Channel Setting Pacing Pulse Width: 0.4 ms
Lead Channel Setting Pacing Pulse Width: 0.7 ms
Lead Channel Setting Sensing Sensitivity: 2 mV
Pulse Gen Model: 3222
Pulse Gen Serial Number: 2981305

## 2018-11-19 ENCOUNTER — Ambulatory Visit: Payer: BLUE CROSS/BLUE SHIELD | Admitting: Internal Medicine

## 2018-11-24 NOTE — Progress Notes (Signed)
Remote pacemaker transmission.   

## 2018-11-29 ENCOUNTER — Other Ambulatory Visit: Payer: Self-pay | Admitting: Cardiovascular Disease

## 2018-11-30 ENCOUNTER — Other Ambulatory Visit: Payer: Self-pay | Admitting: Cardiovascular Disease

## 2018-11-30 MED ORDER — CARVEDILOL 12.5 MG PO TABS: 12.5000 mg | ORAL_TABLET | Freq: Two times a day (BID) | ORAL | 3 refills | Status: DC

## 2018-11-30 NOTE — Telephone Encounter (Signed)
°*  STAT* If patient is at the pharmacy, call can be transferred to refill team.   1. Which medications need to be refilled? (please list name of each medication and dose if known) Carvedilol  2. Which pharmacy/location (including street and city if local pharmacy) is medication to be sent to? [place]  3. Do they need a 30 day or 90 day supply? 90 and refills

## 2018-11-30 NOTE — Telephone Encounter (Signed)
Rx(s) sent to pharmacy electronically.  

## 2018-12-01 ENCOUNTER — Encounter: Payer: Self-pay | Admitting: Internal Medicine

## 2018-12-02 ENCOUNTER — Other Ambulatory Visit: Payer: Self-pay

## 2018-12-04 ENCOUNTER — Encounter: Payer: Self-pay | Admitting: *Deleted

## 2018-12-05 ENCOUNTER — Other Ambulatory Visit: Payer: Self-pay | Admitting: Cardiovascular Disease

## 2018-12-07 ENCOUNTER — Encounter: Payer: Self-pay | Admitting: Internal Medicine

## 2018-12-07 ENCOUNTER — Telehealth: Payer: Self-pay

## 2018-12-07 MED ORDER — LOSARTAN POTASSIUM 25 MG PO TABS: 25.0000 mg | ORAL_TABLET | Freq: Two times a day (BID) | ORAL | 3 refills | Status: DC

## 2018-12-07 MED ORDER — JARDIANCE 25 MG PO TABS: 25.0000 mg | ORAL_TABLET | Freq: Every day | ORAL | 1 refills | Status: DC

## 2018-12-07 NOTE — Telephone Encounter (Signed)
Melissa, I would suggest to use Jardiance 25.  This is actually a little better tolerated then Invokana.

## 2018-12-07 NOTE — Telephone Encounter (Signed)
Insurance no longer covers [place].  PA was sent today and came back with a denial due to the following:  The medicine is only covered if patient meets ALL of the following:  1. Patient has a diagnosis of diabetic nephropathy with albuminuria greater that 300 mg per day 2. Your doctor attests that [name] is not a suitable treatment option 3. Your doctor submits clinical and laboratory documentation that confirms diagnosis of kidney disease.  Please advise.

## 2018-12-07 NOTE — Telephone Encounter (Signed)
RX for [employer] sent.  Still waiting to hear back on the PA for Trulicity.

## 2018-12-08 ENCOUNTER — Other Ambulatory Visit: Payer: Self-pay

## 2018-12-08 ENCOUNTER — Other Ambulatory Visit: Payer: Self-pay | Admitting: Pharmacist Clinician (PhC)/ Clinical Pharmacy Specialist

## 2018-12-08 MED ORDER — TRULICITY 1.5 MG/0.5ML ~~LOC~~ SOAJ: SUBCUTANEOUS | 11 refills | Status: DC

## 2018-12-08 MED ORDER — RIVAROXABAN 20 MG PO TABS: 20.0000 mg | ORAL_TABLET | Freq: Every day | ORAL | 1 refills | Status: DC

## 2018-12-09 ENCOUNTER — Telehealth: Payer: Self-pay

## 2018-12-09 LAB — CBC
Hematocrit: 39.6 % (ref 34.0–46.6)
Hemoglobin: 13.2 g/dL (ref 11.1–15.9)
MCH: 31.2 pg (ref 26.6–33.0)
MCHC: 33.3 g/dL (ref 31.5–35.7)
MCV: 94 fL (ref 79–97)
Platelets: 211 10*3/uL (ref 150–450)
RBC: 4.23 x10E6/uL (ref 3.77–5.28)
RDW: 12.3 % (ref 11.7–15.4)
WBC: 5.8 10*3/uL (ref 3.4–10.8)

## 2018-12-09 LAB — COMPREHENSIVE METABOLIC PANEL
ALT: 45 IU/L — ABNORMAL HIGH (ref 0–32)
AST: 25 IU/L (ref 0–40)
Albumin/Globulin Ratio: 1.5 (ref 1.2–2.2)
Albumin: 4.1 g/dL (ref 3.8–4.8)
Alkaline Phosphatase: 61 IU/L (ref 39–117)
BUN/Creatinine Ratio: 20 (ref 12–28)
BUN: 13 mg/dL (ref 8–27)
Bilirubin Total: 0.4 mg/dL (ref 0.0–1.2)
CO2: 22 mmol/L (ref 20–29)
Calcium: 9.2 mg/dL (ref 8.7–10.3)
Chloride: 101 mmol/L (ref 96–106)
Creatinine, Ser: 0.65 mg/dL (ref 0.57–1.00)
GFR calc Af Amer: 107 mL/min/{1.73_m2} (ref >59)
GFR calc non Af Amer: 93 mL/min/{1.73_m2} (ref >59)
Globulin, Total: 2.8 g/dL (ref 1.5–4.5)
Glucose: 194 mg/dL — ABNORMAL HIGH (ref 65–99)
Potassium: 4.5 mmol/L (ref 3.5–5.2)
Sodium: 139 mmol/L (ref 134–144)
Total Protein: 6.9 g/dL (ref 6.0–8.5)

## 2018-12-09 LAB — VITAMIN D 25 HYDROXY (VIT D DEFICIENCY, FRACTURES): Vit D, 25-Hydroxy: 26.5 ng/mL — ABNORMAL LOW (ref 30.0–100.0)

## 2018-12-09 LAB — HEMOGLOBIN A1C
Est. average glucose Bld gHb Est-mCnc: 163 mg/dL
Hgb A1c MFr Bld: 7.3 % — ABNORMAL HIGH (ref 4.8–5.6)

## 2018-12-09 LAB — LIPID PANEL
Chol/HDL Ratio: 1.5 ratio (ref 0.0–4.4)
Cholesterol, Total: 92 mg/dL — ABNORMAL LOW (ref 100–199)
HDL: 62 mg/dL (ref >39)
LDL Chol Calc (NIH): 12 mg/dL (ref 0–99)
Triglycerides: 97 mg/dL (ref 0–149)
VLDL Cholesterol Cal: 18 mg/dL (ref 5–40)

## 2018-12-09 LAB — TSH: TSH: 1.56 u[IU]/mL (ref 0.450–4.500)

## 2018-12-09 MED ORDER — JARDIANCE 25 MG PO TABS: 25.0000 mg | ORAL_TABLET | Freq: Every day | ORAL | 3 refills | Status: DC

## 2018-12-09 MED ORDER — TRULICITY 1.5 MG/0.5ML ~~LOC~~ SOAJ: SUBCUTANEOUS | 3 refills | Status: DC

## 2018-12-09 NOTE — Telephone Encounter (Signed)
Prior authorization for [name] has been approved by patient's insurance.  Coverage is effective 12/09/2019  PA Approval/Reference Number [id number]  Approval letter has been sent to scanning.

## 2018-12-09 NOTE — Telephone Encounter (Signed)
Patient is calling requesting to speak with [name] about the information below,  Please Advise. Thanks

## 2018-12-09 NOTE — Telephone Encounter (Signed)
Prior authorization for Trulicity has been approved by patient's insurance.  Coverage is effective until 12/09/2019    PA Approval/Reference Number PA [id number]  Approval letter has been sent to scanning.

## 2018-12-17 ENCOUNTER — Encounter: Payer: Self-pay | Admitting: Internal Medicine

## 2018-12-22 ENCOUNTER — Ambulatory Visit: Payer: No Typology Code available for payment source | Admitting: Internal Medicine

## 2019-01-07 ENCOUNTER — Ambulatory Visit: Payer: No Typology Code available for payment source | Admitting: Internal Medicine

## 2019-01-07 ENCOUNTER — Encounter: Payer: Self-pay | Admitting: Internal Medicine

## 2019-01-07 ENCOUNTER — Other Ambulatory Visit: Payer: Self-pay | Admitting: Internal Medicine

## 2019-01-07 ENCOUNTER — Other Ambulatory Visit: Payer: Self-pay

## 2019-01-07 VITALS — BP 110/80 | HR 102 | Temp 98.7°F | Wt 154.8 lb

## 2019-01-07 DIAGNOSIS — Z23 Encounter for immunization: Secondary | ICD-10-CM | POA: Diagnosis not present

## 2019-01-07 DIAGNOSIS — Z111 Encounter for screening for respiratory tuberculosis: Secondary | ICD-10-CM | POA: Diagnosis not present

## 2019-01-07 DIAGNOSIS — B3731 Acute candidiasis of vulva and vagina: Secondary | ICD-10-CM

## 2019-01-07 DIAGNOSIS — B373 Candidiasis of vulva and vagina: Secondary | ICD-10-CM

## 2019-01-07 MED ORDER — NYSTATIN 100000 UNIT/GM EX POWD: Freq: Four times a day (QID) | CUTANEOUS | 1 refills | Status: DC

## 2019-01-07 MED ORDER — CLOTRIMAZOLE 1 % EX OINT: TOPICAL_OINTMENT | CUTANEOUS | 1 refills | Status: DC

## 2019-01-07 NOTE — Patient Instructions (Signed)
-  Nice seeing you today!!  -Lab work today; will notify you once results are available.  -Flu vaccine today.  -Apply nystatin powder to groin creases and clotrimazole cream to vulvar area on clean, dry skin twice a day.

## 2019-01-07 NOTE — Telephone Encounter (Signed)
Medication Refill - Medication: nystatin (MYCOSTATIN/NYSTOP) powder [id number] Clotrimazole 1 % OINT [id number]     Preferred Pharmacy (with phone number or street name): [facility] [address], [place]  [address] [place] [address]  Phone: [phone] Fax: [phone]      Agent: Please be advised that RX refills may take up to 3 business days. We ask that you follow-up with your pharmacy.

## 2019-01-07 NOTE — Progress Notes (Signed)
Established Patient Office Visit     CC/Reason for Visit: Vaginal rash  HPI: [name] is a 68 y.o. female who is coming in today for the above mentioned reasons.  She is complaining of a very itchy rash in her groin and vulvar area.  She has tried over-the-counter baby rash creams without relief.  She states this started after initiation of Jardiance.  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   Past Medical/Surgical History: Past Medical History:  Diagnosis Date  . AICD (automatic cardioverter/defibrillator) present 01/2013   Biventricular cardiac pacemaker in situ   . Allergic rhinitis   . CAD (coronary artery disease)    a. 2004: s/p MI in [place]. No PCI->Medical RX;  b. 07/2012 Cath: LM 30-40, LAD 70p, 70/84m D1 80-90p, OM1 small 90p, OM2 large 80-90p, 522m70-80d, RCA 20-30 diff, EF 40%, glob HK.s/p CABG  . [facility]   Patient reports left breast cancer diagnosis in 2002 treated with bilateral mastectomy positive lymph nodes with left axillary dissection followed by chemotherapy of unknown type  . Cataract   . Diabetes mellitus type II   . Exertional shortness of breath   . Hyperlipidemia   . Hypertension   . Ischemic cardiomyopathy    a. 07/2012 Echo: EF 35%, Sev inferoseptal HK, mildly dil LA, Peak PASP 5984m.  . LMarland KitchenBB (left bundle branch block)    a. intermittent - present during rapid afib 07/2012.  . NMarland Kitchenuromuscular disorder ([facility]  Patient reports chronic numbness in the right foot related to previous surgery on the right leg and "nerve damage"  . PAF (paroxysmal atrial fibrillation) ([place]  a. 07/2012: Amio and xarelto initiated.  . SBO (small bowel obstruction) ([facility]   Past Surgical History:  Procedure Laterality Date  . ABDOMINAL HYSTERECTOMY  2000  . APPENDECTOMY  1974  . BI-VENTRICULAR PACEMAKER INSERTION N/A 01/25/2013   Procedure: BI-VENTRICULAR  PACEMAKER INSERTION (CRT-P);  Surgeon: GreEvans LanceD;  [facility];  Service: Cardiovascular;  Laterality: N/A;  . BREAST BIOPSY Left 2002  . CARDIAC CATHETERIZATION    . CHOLECYSTECTOMY OPEN  1974  . CORONARY ARTERY BYPASS GRAFT N/A 09/28/2012   Procedure: CORONARY ARTERY BYPASS GRAFTING (CABG);  Surgeon: EdwGrace IsaacD;  [facility] [place] [place] [facility];  Laterality: N/A;  CABG x four, using left internal mammary artery and left leg greater saphenous vein harvested endoscopically  . DILATION AND CURETTAGE OF UTERUS  1983  . EPICARDIAL PACING LEAD PLACEMENT N/A 09/28/2012   Procedure: EPICARDIAL PACING LEAD PLACEMENT;  Surgeon: EdwGrace IsaacD;  [facility] [place] [place];  Laterality: N/A;  LV LEAD PLACEMENT  . HERNIA REPAIR    . INCISIONAL HERNIA REPAIR N/A 05/25/2015   Procedure: LAPAROSCOPIC INCISIONAL HERNIA WITH MESH ;  Surgeon: MatRolm BookbinderD;  [facility] [place] [place];  Laterality: N/A;  . INCONTINENCE SURGERY  2000  . INSERTION OF MESH N/A 05/25/2015   Procedure: INSERTION OF MESH;  Surgeon: MatRolm BookbinderD;  [facility] [place] [place];  Laterality: N/A;  . INTRAOPERATIVE TRANSESOPHAGEAL ECHOCARDIOGRAM N/A 09/28/2012   Procedure: INTRAOPERATIVE TRANSESOPHAGEAL ECHOCARDIOGRAM;  Surgeon: EdwGrace IsaacD;  [place] [facility] [place];  Laterality: N/A;  . LAPAROSCOPIC INCISIONAL / UMBILICAL / VENTRAL HERNIA REPAIR  05/25/2015  [facility]  . LEFT HEART CATHETERIZATION WITH CORONARY ANGIOGRAM N/A 07/20/2012   Procedure: LEFT HEART CATHETERIZATION WITH CORONARY ANGIOGRAM;  Surgeon: [name], MD;  Location: [facility] CATH LAB;  Service: Cardiovascular;  Laterality: N/A;  . MASTECTOMY Right 2002  . MASTECTOMY MODIFIED RADICAL W/ AXILLARY LYMPH NODES W/ OR W/O PECTORALIS MINOR Left 2002  . MAZE N/A 09/28/2012   Procedure: MAZE;  Surgeon: [name], MD;  Location: [place];  Service: Open Heart  Surgery;  Laterality: N/A;  . ORIF WRIST FRACTURE Right 11/08/2017   Procedure: OPEN REDUCTION INTERNAL FIXATION (ORIF) WRIST FRACTURE;  Surgeon: [name], MD;  Location: [place];  Service: Orthopedics;  Laterality: Right;  90 mins  . TENDON REPAIR Right 2001 X 3-4   torn ligaments and tendons in ankle up to knee from work related accident  . TUBAL LIGATION  1987    Social History:  reports that she quit smoking about 6 years ago. Her smoking use included cigarettes. She has a 10.00 pack-year smoking history. She has never used smokeless tobacco. She reports that she does not drink alcohol or use drugs.  Allergies: Allergies  Allergen Reactions  . Statins Other (See Comments)    Myalgias with rosuvastatin, atorvastatin and simvastatin     Family History:  Family History  Problem Relation Age of Onset  . Kidney failure Mother   . Heart disease Mother        Died mid 41U complications of diabetes  . Colon cancer Sister   . Arthritis Other   . Cancer Other        ovarian  . Diabetes Other   . Hyperlipidemia Other   . Liver cancer Brother   . Stomach cancer Neg Hx   . Rectal cancer Neg Hx   . Esophageal cancer Neg Hx      Current Outpatient Medications:  .  BAYER MICROLET LANCETS lancets, Use as instructed, Disp: 100 each, Rfl: 2 .  Blood Glucose Monitoring Suppl (BAYER CONTOUR NEXT LINK) w/Device KIT, Use to check blood sugar 2 times per day., Disp: 1 kit, Rfl: 2 .  carvedilol (COREG) 12.5 MG tablet, Take 1 tablet (12.5 mg total) by mouth 2 (two) times daily with a meal., Disp: 180 tablet, Rfl: 3 .  Dulaglutide (TRULICITY) 1.5 LA/4.5XM SOPN, INJECT 1.5 MG INTO THE SKIN ONCE A WEEK., Disp: 4 pen, Rfl: 3 .  empagliflozin (JARDIANCE) 25 MG TABS tablet, Take 25 mg by mouth daily before breakfast., Disp: 90 tablet, Rfl: 3 .  Evolocumab (REPATHA SURECLICK) 468 MG/ML SOAJ, Inject 140 mg into the skin every 14 (fourteen) days., Disp: 2 pen, Rfl: 11 .  furosemide (LASIX) 20 MG  tablet, TAKE 1 TABLET(20 MG) BY MOUTH DAILY, Disp: 90 tablet, Rfl: 0 .  glipiZIDE (GLUCOTROL) 5 MG tablet, TAKE ONE TABLET BY MOUTH TWICE A DAY BEFORE A MEAL, Disp: 200 tablet, Rfl: 4 .  glucose blood (CONTOUR NEXT TEST) test strip, Use as instructed to check sugar 2 times daily, Disp: 200 each, Rfl: 5 .  losartan (COZAAR) 25 MG tablet, Take 1 tablet (25 mg total) by mouth 2 (two) times daily., Disp: 180 tablet, Rfl: 3 .  metFORMIN (GLUCOPHAGE) 1000 MG tablet, TAKE ONE TABLET BY MOUTH TWICE A DAY WITH FOOD, Disp: 200 tablet, Rfl: 4 .  nitroGLYCERIN (NITROSTAT) 0.4 MG SL tablet, Place 1 tablet (0.4 mg total) under the tongue every 5 (five) minutes as needed for chest pain., Disp: 25 tablet, Rfl: 3 .  rivaroxaban (  XARELTO) 20 MG TABS tablet, Take 1 tablet (20 mg total) by mouth daily with supper., Disp: 90 tablet, Rfl: 1 .  Clotrimazole 1 % OINT, Apply to vulvar area twice a day on clean, dry skin, Disp: 56.7 g, Rfl: 1 .  nystatin (MYCOSTATIN/NYSTOP) powder, Apply topically 4 (four) times daily., Disp: 15 g, Rfl: 1  Review of Systems:  Constitutional: Denies fever, chills, diaphoresis, appetite change and fatigue.  HEENT: Denies photophobia, eye pain, redness, hearing loss, ear pain, congestion, sore throat, rhinorrhea, sneezing, mouth sores, trouble swallowing, neck pain, neck stiffness and tinnitus.   Respiratory: Denies SOB, DOE, cough, chest tightness,  and wheezing.   Cardiovascular: Denies chest pain, palpitations and leg swelling.  Gastrointestinal: Denies nausea, vomiting, abdominal pain, diarrhea, constipation, blood in stool and abdominal distention.  Genitourinary: Denies dysuria, urgency, frequency, hematuria, flank pain and difficulty urinating.  Endocrine: Denies: hot or cold intolerance, sweats, changes in hair or nails, polyuria, polydipsia. Musculoskeletal: Denies myalgias, back pain, joint swelling, arthralgias and gait problem.  Skin: Denies pallor and wound.  Neurological:  Denies dizziness, seizures, syncope, weakness, light-headedness, numbness and headaches.  Hematological: Denies adenopathy. Easy bruising, personal or family bleeding history  Psychiatric/Behavioral: Denies suicidal ideation, mood changes, confusion, nervousness, sleep disturbance and agitation    Physical Exam: Vitals:   01/07/19 1531  BP: 110/80  Pulse: (!) 102  Temp: 98.7 F (37.1 C)  TempSrc: Temporal  SpO2: 94%  Weight: 154 lb 12.8 oz (70.2 kg)    Body mass index is 27.42 kg/m.   Constitutional: NAD, calm, comfortable Eyes: PERRL, lids and conjunctivae normal ENMT: Mucous membranes are moist.  Abdomen: no tenderness, no masses palpated. No hepatosplenomegaly. Bowel sounds positive.  Genitourinary: Yeast of her vulvar area extending into her perineum and to her groin creases Psychiatric: Normal judgment and insight. Alert and oriented x 3. Normal mood.    Impression and Plan:  Yeast infection involving the vagina and surrounding area  -Likely related to Jardiance. -Clotrimazole cream and nystatin powder prescribed.  Encounter for TB tine test  - Plan: QuantiFERON-TB Gold Plus    Patient Instructions  -Nice seeing you today!!  -Lab work today; will notify you once results are available.  -Flu vaccine today.  -Apply nystatin powder to groin creases and clotrimazole cream to vulvar area on clean, dry skin twice a day.     Lelon Frohlich, MD [facility]

## 2019-01-08 ENCOUNTER — Telehealth: Payer: Self-pay

## 2019-01-08 NOTE — Telephone Encounter (Signed)
Copied from [place] [id number]. Topic: General - Inquiry >> Jan 07, 2019  4:46 PM Mathis Bud wrote: Reason for CRM: April called from [facility] regarding Clotrimazole 1 % OINT. They have no ointment. Pharmacy wants to know if she can switch to a cream instead.   Call back [phone]

## 2019-01-08 NOTE — Telephone Encounter (Signed)
Spoke with pharmacist  

## 2019-01-09 LAB — QUANTIFERON-TB GOLD PLUS
Mitogen-NIL: >10 IU/mL
NIL: 0.05 IU/mL
QuantiFERON-TB Gold Plus: NEGATIVE
TB1-NIL: 0 IU/mL
TB2-NIL: 0 IU/mL

## 2019-01-12 NOTE — Telephone Encounter (Signed)
Patient is aware and forms picked up.

## 2019-01-20 ENCOUNTER — Other Ambulatory Visit: Payer: Self-pay

## 2019-01-20 DIAGNOSIS — Z20822 Contact with and (suspected) exposure to covid-19: Secondary | ICD-10-CM

## 2019-01-23 LAB — NOVEL CORONAVIRUS, NAA: SARS-CoV-2, NAA: NOT DETECTED

## 2019-01-27 ENCOUNTER — Other Ambulatory Visit: Payer: Self-pay | Admitting: Cardiovascular Disease

## 2019-01-27 DIAGNOSIS — I10 Essential (primary) hypertension: Secondary | ICD-10-CM

## 2019-01-29 NOTE — Telephone Encounter (Signed)
[place] - can you look into this

## 2019-02-09 ENCOUNTER — Ambulatory Visit (INDEPENDENT_AMBULATORY_CARE_PROVIDER_SITE_OTHER): Payer: No Typology Code available for payment source | Admitting: *Deleted

## 2019-02-09 DIAGNOSIS — I480 Paroxysmal atrial fibrillation: Secondary | ICD-10-CM | POA: Diagnosis not present

## 2019-02-09 LAB — CUP PACEART REMOTE DEVICE CHECK
Battery Remaining Longevity: 34 mo
Battery Remaining Percentage: 44 %
Battery Voltage: 2.86 V
Brady Statistic AP VP Percent: 1 %
Brady Statistic AP VS Percent: 1 %
Brady Statistic AS VP Percent: 99 %
Brady Statistic AS VS Percent: 1 %
Brady Statistic RA Percent Paced: 1 %
Date Time Interrogation Session: 20201201024009
Implantable Lead Implant Date: 20140721
Implantable Lead Implant Date: 20141117
Implantable Lead Implant Date: 20141117
Implantable Lead Location: 753858
Implantable Lead Location: 753859
Implantable Lead Location: 753860
Implantable Lead Model: 5071
Implantable Pulse Generator Implant Date: 20141117
Lead Channel Impedance Value: 400 Ohm
Lead Channel Impedance Value: 440 Ohm
Lead Channel Impedance Value: 590 Ohm
Lead Channel Pacing Threshold Amplitude: 0.5 V
Lead Channel Pacing Threshold Amplitude: 0.75 V
Lead Channel Pacing Threshold Amplitude: 2.25 V
Lead Channel Pacing Threshold Pulse Width: 0.4 ms
Lead Channel Pacing Threshold Pulse Width: 0.4 ms
Lead Channel Pacing Threshold Pulse Width: 0.7 ms
Lead Channel Sensing Intrinsic Amplitude: 0.5 mV
Lead Channel Sensing Intrinsic Amplitude: 12 mV
Lead Channel Setting Pacing Amplitude: 2 V
Lead Channel Setting Pacing Amplitude: 2.5 V
Lead Channel Setting Pacing Amplitude: 2.5 V
Lead Channel Setting Pacing Pulse Width: 0.4 ms
Lead Channel Setting Pacing Pulse Width: 0.7 ms
Lead Channel Setting Sensing Sensitivity: 2 mV
Pulse Gen Model: 3222
Pulse Gen Serial Number: 2981305

## 2019-03-06 NOTE — Progress Notes (Signed)
PPM remote 

## 2019-04-12 ENCOUNTER — Other Ambulatory Visit: Payer: Self-pay

## 2019-04-12 MED ORDER — CARVEDILOL 12.5 MG PO TABS: 12.5000 mg | ORAL_TABLET | Freq: Two times a day (BID) | ORAL | 2 refills | Status: DC

## 2019-04-13 ENCOUNTER — Other Ambulatory Visit: Payer: Self-pay

## 2019-04-13 ENCOUNTER — Telehealth: Payer: Self-pay | Admitting: Cardiovascular Disease

## 2019-04-13 NOTE — Telephone Encounter (Signed)
Patient c/o Palpitations:  High priority if patient c/o lightheadedness, shortness of breath, or chest pain  1) How long have you had palpitations/irregular HR/ Afib? Are you having the symptoms now? Early today and yes now.  2) Are you currently experiencing lightheadedness, SOB or CP? Not right at the moment   3) Do you have a history of afib (atrial fibrillation) or irregular heart rhythm? Yes   4) Have you checked your BP or HR? (document readings if available) no   5) Are you experiencing any other symptoms? Heart racing

## 2019-04-13 NOTE — Telephone Encounter (Signed)
Spoke with pt who states she had palpitations last night while lying on her left side where her pacemaker is located. She states she had SOB and pressure so she got up and walked around and this relieved symptoms so she went back to bed and lay down in supine position. She states today she had an episode of SOB, chest pressure, 'fluttering' for 30 min. No symptoms currently. She would like and EKG and to have Dr. Sallyanne Kuster check her pacemaker for any issues. Pt confirms she is on Xarelto, losartan, carvedilol, and furosemide as prescribed. She checked vitals during call and reports current BP 126/67 and HR 87. Pt prefers pacemaker check appt in office vs OV. Informed pt next available appt is 2/8. Pt will take 0800 appt slot. Advised pt to monitor BP and HR and monitor for return of symptoms and/or CP, lightheadedness and may need to report to ED prior to appt. Pt verbalized understanding

## 2019-04-14 ENCOUNTER — Other Ambulatory Visit: Payer: Self-pay

## 2019-04-14 DIAGNOSIS — I10 Essential (primary) hypertension: Secondary | ICD-10-CM

## 2019-04-14 DIAGNOSIS — E1159 Type 2 diabetes mellitus with other circulatory complications: Secondary | ICD-10-CM

## 2019-04-14 MED ORDER — METFORMIN HCL 1000 MG PO TABS: ORAL_TABLET | ORAL | 4 refills | Status: DC

## 2019-04-14 MED ORDER — GLIPIZIDE 5 MG PO TABS: ORAL_TABLET | ORAL | 4 refills | Status: DC

## 2019-04-14 MED ORDER — JARDIANCE 25 MG PO TABS: 25.0000 mg | ORAL_TABLET | Freq: Every day | ORAL | 3 refills | Status: DC

## 2019-04-14 MED ORDER — RIVAROXABAN 20 MG PO TABS: 20.0000 mg | ORAL_TABLET | Freq: Every day | ORAL | 1 refills | Status: DC

## 2019-04-14 MED ORDER — TRULICITY 1.5 MG/0.5ML ~~LOC~~ SOAJ: SUBCUTANEOUS | 3 refills | Status: DC

## 2019-04-14 NOTE — Telephone Encounter (Signed)
Okay, I will take a look at the download.

## 2019-04-14 NOTE — Telephone Encounter (Signed)
The patient has been made aware and verbalized her understanding. She will keep her appointment on 04/19/2019

## 2019-04-14 NOTE — Telephone Encounter (Addendum)
Transmission reviewed. Normal device function. Presenting rhythm AS/BiVP 90s. 1 PMT episode noted on 04/13/19 at 15:48, terminated with algorithm. 47 PMT detections total since 11/2016. No other events from yesterday. <1% AT/AF burden, most recent on 03/17/19, longest AF episode 1.5hrs on 03/09/19. No HVRs since 11/2018. CorVue at baseline. Lead trends stable.

## 2019-04-14 NOTE — Telephone Encounter (Signed)
Called the patient back to discuss her symptoms. She stated that last night when she was lying down, she began to have the shortness of breath feeling and a fluttering in her chest. She checked her pulse and blood pressure. It was 145/74 and 84 for the pulse. She is only getting the feeling when she is lying down. She denies chest pain or pressure, just a fluttering feeling and shortness of breath.   She decided to take a nitro and then 10 minutes later she felt fine.  She stated that she felt fine this morning. She was asked to do a download but is currently at work. She will do a download around 1:30 when she gets home.

## 2019-04-14 NOTE — Telephone Encounter (Signed)
Looks very good. Normal rhythm right now. Very brief and low risk 10 second event yesterday at 1548h, no true AFib since late December. No signs of excess luid and >99% BiV paced.

## 2019-04-15 ENCOUNTER — Other Ambulatory Visit: Payer: Self-pay | Admitting: *Deleted

## 2019-04-15 MED ORDER — FUROSEMIDE 20 MG PO TABS: ORAL_TABLET | ORAL | 3 refills | Status: DC

## 2019-04-15 MED ORDER — LOSARTAN POTASSIUM 25 MG PO TABS: 25.0000 mg | ORAL_TABLET | Freq: Two times a day (BID) | ORAL | 3 refills | Status: DC

## 2019-04-15 MED ORDER — CARVEDILOL 12.5 MG PO TABS: 12.5000 mg | ORAL_TABLET | Freq: Two times a day (BID) | ORAL | 1 refills | Status: DC

## 2019-04-15 NOTE — Telephone Encounter (Signed)
Rx has been sent to the pharmacy electronically. ° °

## 2019-04-16 ENCOUNTER — Other Ambulatory Visit: Payer: Self-pay

## 2019-04-19 ENCOUNTER — Encounter: Payer: Self-pay | Admitting: Cardiovascular Disease

## 2019-04-19 ENCOUNTER — Other Ambulatory Visit: Payer: Self-pay

## 2019-04-19 ENCOUNTER — Ambulatory Visit (INDEPENDENT_AMBULATORY_CARE_PROVIDER_SITE_OTHER): Payer: Medicare HMO | Admitting: Cardiovascular Disease

## 2019-04-19 VITALS — BP 151/82 | HR 86 | Temp 97.0°F | Ht 64.0 in | Wt 153.0 lb

## 2019-04-19 DIAGNOSIS — Z950 Presence of cardiac pacemaker: Secondary | ICD-10-CM

## 2019-04-19 DIAGNOSIS — E782 Mixed hyperlipidemia: Secondary | ICD-10-CM

## 2019-04-19 DIAGNOSIS — R0989 Other specified symptoms and signs involving the circulatory and respiratory systems: Secondary | ICD-10-CM | POA: Diagnosis not present

## 2019-04-19 DIAGNOSIS — E1159 Type 2 diabetes mellitus with other circulatory complications: Secondary | ICD-10-CM

## 2019-04-19 DIAGNOSIS — I480 Paroxysmal atrial fibrillation: Secondary | ICD-10-CM

## 2019-04-19 DIAGNOSIS — I10 Essential (primary) hypertension: Secondary | ICD-10-CM | POA: Diagnosis not present

## 2019-04-19 DIAGNOSIS — I25700 Atherosclerosis of coronary artery bypass graft(s), unspecified, with unstable angina pectoris: Secondary | ICD-10-CM

## 2019-04-19 DIAGNOSIS — Z7901 Long term (current) use of anticoagulants: Secondary | ICD-10-CM

## 2019-04-19 DIAGNOSIS — I5042 Chronic combined systolic (congestive) and diastolic (congestive) heart failure: Secondary | ICD-10-CM

## 2019-04-19 LAB — COMPREHENSIVE METABOLIC PANEL
ALT: 38 IU/L — ABNORMAL HIGH (ref 0–32)
AST: 22 IU/L (ref 0–40)
Albumin/Globulin Ratio: 1.5 (ref 1.2–2.2)
Albumin: 4.3 g/dL (ref 3.8–4.8)
Alkaline Phosphatase: 56 IU/L (ref 39–117)
BUN/Creatinine Ratio: 17 (ref 12–28)
BUN: 11 mg/dL (ref 8–27)
Bilirubin Total: 0.4 mg/dL (ref 0.0–1.2)
CO2: 24 mmol/L (ref 20–29)
Calcium: 9.7 mg/dL (ref 8.7–10.3)
Chloride: 100 mmol/L (ref 96–106)
Creatinine, Ser: 0.66 mg/dL (ref 0.57–1.00)
GFR calc Af Amer: 106 mL/min/{1.73_m2} (ref >59)
GFR calc non Af Amer: 92 mL/min/{1.73_m2} (ref >59)
Globulin, Total: 2.8 g/dL (ref 1.5–4.5)
Glucose: 115 mg/dL — ABNORMAL HIGH (ref 65–99)
Potassium: 4.8 mmol/L (ref 3.5–5.2)
Sodium: 140 mmol/L (ref 134–144)
Total Protein: 7.1 g/dL (ref 6.0–8.5)

## 2019-04-19 LAB — CBC
Hematocrit: 39.4 % (ref 34.0–46.6)
Hemoglobin: 13.2 g/dL (ref 11.1–15.9)
MCH: 30.8 pg (ref 26.6–33.0)
MCHC: 33.5 g/dL (ref 31.5–35.7)
MCV: 92 fL (ref 79–97)
Platelets: 215 10*3/uL (ref 150–450)
RBC: 4.29 x10E6/uL (ref 3.77–5.28)
RDW: 12.4 % (ref 11.7–15.4)
WBC: 5.5 10*3/uL (ref 3.4–10.8)

## 2019-04-19 LAB — HEMOGLOBIN A1C
Est. average glucose Bld gHb Est-mCnc: 160 mg/dL
Hgb A1c MFr Bld: 7.2 % — ABNORMAL HIGH (ref 4.8–5.6)

## 2019-04-19 LAB — LIPID PANEL
Chol/HDL Ratio: 1.6 ratio (ref 0.0–4.4)
Cholesterol, Total: 100 mg/dL (ref 100–199)
HDL: 61 mg/dL (ref >39)
LDL Chol Calc (NIH): 19 mg/dL (ref 0–99)
Triglycerides: 115 mg/dL (ref 0–149)
VLDL Cholesterol Cal: 20 mg/dL (ref 5–40)

## 2019-04-19 MED ORDER — NITROGLYCERIN 0.4 MG SL SUBL: 0.4000 mg | SUBLINGUAL_TABLET | SUBLINGUAL | 3 refills | Status: DC | PRN

## 2019-04-19 MED ORDER — REPATHA SURECLICK 140 MG/ML ~~LOC~~ SOAJ: 2.0000 "pen " | SUBCUTANEOUS | 11 refills | Status: DC

## 2019-04-19 NOTE — Progress Notes (Signed)
Cardiology Office Note:    Date:  04/19/2019   ID:  [name], DOB 12-02-1950, MRN [id number]  PCP:  Isaac Bliss, Rayford Halsted, MD  Cardiologist:  [name], MD    Referring MD: Isaac Bliss, Estel*   Chief Complaint  Patient presents with  . Coronary Artery Disease  . Chest Pain  Follow-up coronary artery disease, CRT-P  History of Present Illness:    [name] is a 69 y.o. female with a hx of CAD s/p CABG 2014, paroxysmal atrial fibrillation, CHF with recovery of EF after CRT-P (St. Jude), type 2 diabetes mellitus, hypercholesterolemia, hypertension returning for follow-up.  A couple of weeks ago she experienced chest tightness and shortness of breath with light activity (she was actually descending an escalator at the mall after talking).  The symptoms were severe enough to make her stop and sit down for several minutes.  A similar episode happened about a week later at home laying down on the couch and improved after she took sublingual nitroglycerin.  She describes her discomfort as a retrosternal squeezing sensation and uses the [employer] sign.  Otherwise she denies  dyspnea with exertion, orthopnea, paroxysmal nocturnal dyspnea, syncope, palpitations, focal neurological deficits, intermittent claudication, lower extremity edema, unexplained weight gain, cough, hemoptysis or wheezing.  ECG is nondiagnostic due to biventricular pacing (she had an LBBB before).  Nuclear stress test in 2019 showed a fixed apical defect, no ischemia.  She reports that her blood pressure has been rather variable, but typically in the 120s/70s.  She has been taking both Invokana and Jardiance.  She is also on glipizide and Metformin.  She has not had symptoms of hypoglycemia.  The most recent hemoglobin A1c I have on file was 7.3% in September 2020.  On the same labs her LDL cholesterol was 12 on Repatha.  She did an extra download on her device a few days ago that showed no evidence of  recent arrhythmia (she has had atrial fibrillation in the past), excellent biventricular pacing, no evidence of fluid overload by thoracic impedance monitoring.  [name] presented with myocardial infarction in 2004. In May 2014 presented with congestive heart failure and atrial fibrillation in the setting of three-vessel coronary artery disease leading to bypass surgery (July 2014, Dr. Servando Snare, LIMA to LAD, SVG to ramus intermedius, sequential SVG to OM1 and OM 2, surgical maze procedure). She had persistent depressed left ventricular systolic function and received a dual-chamber biventricular pacemaker (St. Jude Allure, Dr. Lovena Le in November 2014).   Last echo February 2016 shows recovery of left ventricular ejection fraction 50-55%. In January 2015, she was admitted with acute on chronic heart failure due to excessive intravenous fluid administration during an episode of small bowel obstruction.. She has type 2 diabetes mellitus that has been difficult to control as well as hypertension and hyperlipidemia.  Nuclear stress test 03/06/2018:  The left ventricular ejection fraction is mildly decreased (45-54%).  Nuclear stress EF: 53%.  There was no ST segment deviation noted during stress.  Defect 1: There is a small, fixed defect of moderate severity present in the apical anterior and apex location.  This is a low risk study.  Past Medical History:  Diagnosis Date  . AICD (automatic cardioverter/defibrillator) present 01/2013   Biventricular cardiac pacemaker in situ   . Allergic rhinitis   . CAD (coronary artery disease)    a. 2004: s/p MI in [place]. No PCI->Medical RX;  b. 07/2012 Cath: LM 30-40, LAD 70p, 70/46m D1 80-90p, OM1  small 90p, OM2 large 80-90p, 76m 70-80d, RCA 20-30 diff, EF 40%, glob HK.s/p CABG  . Cancer of left breast ([facility] 2002   Patient reports left breast cancer diagnosis in 2002 treated with bilateral mastectomy positive lymph nodes with left axillary dissection  followed by chemotherapy of unknown type  . Cataract   . Diabetes mellitus type II   . Exertional shortness of breath   . Hyperlipidemia   . Hypertension   . Ischemic cardiomyopathy    a. 07/2012 Echo: EF 35%, Sev inferoseptal HK, mildly dil LA, Peak PASP 581mg.  . Marland KitchenBBB (left bundle branch block)    a. intermittent - present during rapid afib 07/2012.  . Marland Kitcheneuromuscular disorder ([place]   Patient reports chronic numbness in the right foot related to previous surgery on the right leg and "nerve damage"  . PAF (paroxysmal atrial fibrillation) ([facility]   a. 07/2012: Amio and xarelto initiated.  . SBO (small bowel obstruction) ([facility]    Past Surgical History:  Procedure Laterality Date  . ABDOMINAL HYSTERECTOMY  2000  . APPENDECTOMY  1974  . BI-VENTRICULAR PACEMAKER INSERTION N/A 01/25/2013   Procedure: BI-VENTRICULAR PACEMAKER INSERTION (CRT-P);  Surgeon: GrEvans LanceMD;  [facility];  Service: Cardiovascular;  Laterality: N/A;  . BREAST BIOPSY Left 2002  . CARDIAC CATHETERIZATION    . CHOLECYSTECTOMY OPEN  1974  . CORONARY ARTERY BYPASS GRAFT N/A 09/28/2012   Procedure: CORONARY ARTERY BYPASS GRAFTING (CABG);  Surgeon: EdGrace IsaacMD;  [facility] Service: Open Heart Surgery;  Laterality: N/A;  CABG x four, using left internal mammary artery and left leg greater saphenous vein harvested endoscopically  . DILATION AND CURETTAGE OF UTERUS  1983  . EPICARDIAL PACING LEAD PLACEMENT N/A 09/28/2012   Procedure: EPICARDIAL PACING LEAD PLACEMENT;  Surgeon: EdGrace IsaacMD;  Location: [facility] Service: Thoracic;  Laterality: N/A;  LV LEAD PLACEMENT  . HERNIA REPAIR    . INCISIONAL HERNIA REPAIR N/A 05/25/2015   Procedure: LAPAROSCOPIC INCISIONAL HERNIA WITH MESH ;  Surgeon: MaRolm BookbinderMD;  [facility] Service: General;  Laterality: N/A;  . INCONTINENCE SURGERY  2000  . INSERTION OF MESH N/A 05/25/2015   Procedure: INSERTION OF MESH;  Surgeon: MaRolm BookbinderMD;  Location: [facility] Service: General;  Laterality: N/A;  . INTRAOPERATIVE TRANSESOPHAGEAL ECHOCARDIOGRAM N/A 09/28/2012   Procedure: INTRAOPERATIVE TRANSESOPHAGEAL ECHOCARDIOGRAM;  Surgeon: EdGrace IsaacMD;  [place] Service: Open Heart Surgery;  Laterality: N/A;  . LAPAROSCOPIC INCISIONAL / UMBILICAL / VENTRAL HERNIA REPAIR  05/25/2015   IHR  . LEFT HEART CATHETERIZATION WITH CORONARY ANGIOGRAM N/A 07/20/2012   Procedure: LEFT HEART CATHETERIZATION WITH CORONARY ANGIOGRAM;  Surgeon: Peter M JoMartiniqueMD;  [facility];  Service: Cardiovascular;  Laterality: N/A;  . MASTECTOMY Right 2002  . MASTECTOMY MODIFIED RADICAL W/ AXILLARY LYMPH NODES W/ OR W/O PECTORALIS MINOR Left 2002  . MAZE N/A 09/28/2012   Procedure: MAZE;  Surgeon: EdGrace IsaacMD;  [facility]: Open Heart Surgery;  Laterality: N/A;  . ORIF WRIST FRACTURE Right 11/08/2017   Procedure: OPEN REDUCTION INTERNAL FIXATION (ORIF) WRIST FRACTURE;  Surgeon: GrRoseanne KaufmanMD;  [place] [facility] Service: Orthopedics;  Laterality: Right;  90 mins  . TENDON REPAIR Right 2001 X 3-4   torn ligaments and tendons in ankle up to knee from work related accident  . [place]  Current Medications: Current Meds  Medication Sig  . BAYER MICROLET LANCETS lancets Use as instructed  . Blood Glucose Monitoring Suppl (BAYER CONTOUR NEXT LINK) w/Device KIT Use to check blood sugar 2 times per day.  . carvedilol (COREG) 12.5 MG tablet Take 1 tablet (12.5 mg total) by mouth 2 (two) times daily with a meal.  . Clotrimazole 1 % OINT Apply to vulvar area twice a day on clean, dry skin  . Dulaglutide (TRULICITY) 1.5 JY/7.8GN SOPN INJECT 1.5 MG INTO THE SKIN ONCE A WEEK.  . empagliflozin (JARDIANCE) 25 MG TABS tablet Take 25 mg by mouth daily before breakfast.  . furosemide (LASIX) 20 MG tablet TAKE 1 TABLET(20 MG) BY MOUTH DAILY  . glipiZIDE (GLUCOTROL) 5 MG tablet TAKE ONE TABLET BY MOUTH  TWICE A DAY BEFORE A MEAL  . glucose blood (CONTOUR NEXT TEST) test strip Use as instructed to check sugar 2 times daily  . losartan (COZAAR) 25 MG tablet Take 1 tablet (25 mg total) by mouth 2 (two) times daily.  . metFORMIN (GLUCOPHAGE) 1000 MG tablet TAKE ONE TABLET BY MOUTH TWICE A DAY WITH FOOD  . nitroGLYCERIN (NITROSTAT) 0.4 MG SL tablet Place 1 tablet (0.4 mg total) under the tongue every 5 (five) minutes as needed for chest pain.  Marland Kitchen nystatin (MYCOSTATIN/NYSTOP) powder Apply topically 4 (four) times daily.  . rivaroxaban (XARELTO) 20 MG TABS tablet Take 1 tablet (20 mg total) by mouth daily with supper.  . [DISCONTINUED] nitroGLYCERIN (NITROSTAT) 0.4 MG SL tablet Place 1 tablet (0.4 mg total) under the tongue every 5 (five) minutes as needed for chest pain.  . [DISCONTINUED] REPATHA SURECLICK 562 MG/ML SOAJ INJECT 140MG INTO SKIN EVERY 14 DAYS AS DIRECTED     Allergies:   Statins   Social History   Socioeconomic History  . Marital status: Married    Spouse name: Not on file  . Number of children: 3  . Years of education: Not on file  . Highest education level: Not on file  Occupational History  . Occupation: Marketing executive work    Fish farm manager: [employer]. [employer].  Tobacco Use  . Smoking status: Former Smoker    Packs/day: 1.00    Years: 10.00    Pack years: 10.00    Types: Cigarettes    Quit date: 03/11/2012    Years since quitting: 7.1  . Smokeless tobacco: Never Used  Substance and Sexual Activity  . Alcohol use: No  . Drug use: No  . Sexual activity: Yes    Partners: Female    Birth control/protection: Surgical  Other Topics Concern  . Not on file  Social History Narrative   Lives with husband and mother in law.     Regular exercise: walking   Caffeine use: 2 cups of coffee in the morning   Social Determinants of Health   Financial Resource Strain:   . Difficulty of Paying Living Expenses: Not on file  Food Insecurity:   . Worried About Charity fundraiser in the  Last Year: Not on file  . Ran Out of Food in the Last Year: Not on file  Transportation Needs:   . Lack of Transportation (Medical): Not on file  . Lack of Transportation (Non-Medical): Not on file  Physical Activity:   . Days of Exercise per Week: Not on file  . Minutes of Exercise per Session: Not on file  Stress:   . Feeling of Stress : Not on file  Social Connections:   .  Frequency of Communication with Friends and Family: Not on file  . Frequency of Social Gatherings with Friends and Family: Not on file  . Attends Religious Services: Not on file  . Active Member of Clubs or Organizations: Not on file  . Attends Archivist Meetings: Not on file  . Marital Status: Not on file     Family History: The patient's family history includes Arthritis in an other family member; Cancer in an other family member; Colon cancer in her sister; Diabetes in an other family member; Heart disease in her mother; Hyperlipidemia in an other family member; Kidney failure in her mother; Liver cancer in her brother. There is no history of Stomach cancer, Rectal cancer, or Esophageal cancer. ROS:   Please see the history of present illness.    All other systems are reviewed and are negative.   EKGs/Labs/Other Studies Reviewed:    EKG:  EKG is  ordered today.  It shows sinus rhythm with biventricular pacing remarkably narrow QRS with a positive sharp R wave in V1.  (QRS 138 ms).  QTc 533 ms Recent Labs: 12/08/2018: ALT 45; BUN 13; Creatinine, Ser 0.65; Hemoglobin 13.2; Platelets 211; Potassium 4.5; Sodium 139; TSH 1.560  Recent Lipid Panel    Component Value Date/Time   CHOL 92 (L) 12/08/2018 0958   TRIG 97 12/08/2018 0958   HDL 62 12/08/2018 0958   CHOLHDL 1.5 12/08/2018 0958   CHOLHDL 5.2 (H) 04/04/2016 0723   VLDL NOT CALC 04/04/2016 0723   LDLCALC 12 12/08/2018 0958   LDLDIRECT 50 04/04/2016 0723    Physical Exam:    VS:  BP (!) 151/82   Pulse 86   Temp (!) 97 F (36.1 C)   Ht  5' 4"  (1.626 m)   Wt 153 lb (69.4 kg)   SpO2 100%   BMI 26.26 kg/m     Wt Readings from Last 3 Encounters:  04/19/19 153 lb (69.4 kg)  01/07/19 154 lb 12.8 oz (70.2 kg)  11/09/18 155 lb 6.4 oz (70.5 kg)      General: Alert, oriented x3, no distress, appears well Head: no evidence of trauma, PERRL, EOMI, no exophtalmos or lid lag, no myxedema, no xanthelasma; normal ears, nose and oropharynx Neck: normal jugular venous pulsations and no hepatojugular reflux; brisk carotid pulses without delay and a very distinct right carotid bruit please Chest: clear to auscultation, no signs of consolidation by percussion or palpation, normal fremitus, symmetrical and full respiratory excursions Cardiovascular: normal position and quality of the apical impulse, regular rhythm, normal first and second heart sounds, no murmurs, rubs or gallops Abdomen: no tenderness or distention, no masses by palpation, no abnormal pulsatility or arterial bruits, normal bowel sounds, no hepatosplenomegaly Extremities: no clubbing, cyanosis or edema; 2+ radial, ulnar and brachial pulses bilaterally; 2+ right femoral, 1+ posterior tibial and dorsalis pedis pulses; 2+ left femoral, 1+ posterior tibial and dorsalis pedis pulses; no subclavian or femoral bruits Neurological: grossly nonfocal Psych: Normal mood and affect   ASSESSMENT:    1. Coronary artery disease involving coronary bypass graft of native heart with unstable angina pectoris ([place])   2. Chronic combined systolic and diastolic heart failure ([place])   3. Type 2 diabetes mellitus with other circulatory complication, without long-term current use of insulin ([place])   4. PAF (paroxysmal atrial fibrillation) ([place])   5. Long term current use of anticoagulant   6. Essential hypertension   7. Mixed hyperlipidemia   8. Status post biventricular pacemaker  9. Right carotid bruit    PLAN:    In order of problems listed above:  6. CAD s/p CABG: Recent symptoms are  concerning for unstable angina pectoris, but thankfully were brief and self-limited.  Recommend Lexiscan Myoview.  She should seek emergency medical attention for any episode the last 30 minutes and not relieved by 3 consecutive sublingual nitroglycerin tablets.  Continue aspirin, Repatha, carvedilol.  If the nuclear perfusion study shows only a fixed defect or a small area of ischemia, will focus on antianginal medication adjustment, but if she has a large area of ischemia she should undergo coronary angiography. 7. CHF: She appears clinically euvolemic and has good functional status.  CRT hyperresponder.    Prior to CRT-P LVEF was 35-40% and she had a lot of problems with heart failure exacerbation, but following resynchronization therapy EF was 50-55% and she has not had problems with heart failure in a very long time.  On ARB, carvedilol and SGLT2 inhibitor, very low-dose of loop diuretic. 8. DM: It seems she has been mistakenly taking both Invokana and Jardiance.  I asked her to see which one is better covered by insurance and stopped the other.  If they are equally covered I would prefer she continue Jardiance. 9. AFib: Infrequent episodes of atrial fibrillation have been reported by her device, but none recently to explain her symptoms.  She did have a MAZE procedure at the time of bypass.   Continue anticoagulation.. CHADSVasc 7 (age, gender, hypertension, CHF, CAD, DM, HTN). 10. Xarelto: Well-tolerated, no bleeding complications.  Check CBC with recent complaints of angina. 11. HLP: She has had an exceptional response to Repatha and was intolerant of multiple statins due to severe myalgias.  Check labs again today. 12. HTN: Mostly in target range of <130/80.  Prefer use of ARB and beta-blockers. 13. CRT-P: Very recent device download with normal function,>99% biventricular pacing.  Continue downloads every 3 months. 14. Right carotid bruit: Check Doppler studies (not reviewed since before bypass  surgery in 2014).  Total time spent with patient: 35 minutes.  . Medication Adjustments/Labs and Tests Ordered: Current medicines are reviewed at length with the patient today.  Concerns regarding medicines are outlined above.  Orders Placed This Encounter  Procedures  . CBC  . Hemoglobin A1c  . Comprehensive metabolic panel  . Lipid panel  . MYOCARDIAL PERFUSION IMAGING  . EKG 12-Lead  . VAS US CAROTID   Meds ordered this encounter  Medications  . nitroGLYCERIN (NITROSTAT) 0.4 MG SL tablet    Sig: Place 1 tablet (0.4 mg total) under the tongue every 5 (five) minutes as needed for chest pain.    Dispense:  25 tablet    Refill:  3    Signed, [name], MD  04/19/2019 8:45 AM    [facility]

## 2019-04-19 NOTE — Patient Instructions (Signed)
Medication Instructions:  No changes  Do not take both Jardiance and Invokana  *If you need a refill on your cardiac medications before your next appointment, please call your pharmacy*  Lab Work: Your provider would like for you to have the following labs today: CBC, CMET, A1C, and Lipid  If you have labs (blood work) drawn today and your tests are completely normal, you will receive your results only by: Marland Kitchen MyChart Message (if you have MyChart) OR . A paper copy in the mail If you have any lab test that is abnormal or we need to change your treatment, we will call you to review the results.  Testing/Procedures: Your physician has requested that you have a carotid duplex. This test is an ultrasound of the carotid arteries in your neck. It looks at blood flow through these arteries that supply the brain with blood. Allow one hour for this exam. There are no restrictions or special instructions. This will take place at [address], [address].  Your physician has requested that you have a lexiscan myoview. For further information please visit [email or web address]. Please follow instruction sheet, as given. This will take place at [place], [address]  How to prepare for your Myocardial Perfusion Test:  Do not eat or drink 3 hours prior to your test, except you may have water.  Do not consume products containing caffeine (regular or decaffeinated) 12 hours prior to your test. (ex: coffee, chocolate, sodas, tea).  Do bring a list of your current medications with you.  If not listed below, you may take your medications as normal.  Do wear comfortable clothes (no dresses or overalls) and walking shoes, tennis shoes preferred (No heels or open toe shoes are allowed).  Do NOT wear cologne, perfume, aftershave, or lotions (deodorant is allowed).  The test will take approximately 3 to 4 hours to complete  If these instructions are not followed, your test will have to be  rescheduled.    Follow-Up: At [facility], you and your health needs are our priority.  As part of our continuing mission to provide you with exceptional heart care, we have created designated Provider Care Teams.  These Care Teams include your primary Cardiologist (physician) and Advanced Practice Providers (APPs -  Physician Assistants and Nurse Practitioners) who all work together to provide you with the care you need, when you need it.  Your next appointment:   6 month(s)  The format for your next appointment:   In Person  Provider:   [name], MD

## 2019-04-21 ENCOUNTER — Other Ambulatory Visit: Payer: Self-pay

## 2019-04-21 MED ORDER — REPATHA SURECLICK 140 MG/ML ~~LOC~~ SOAJ: 2.0000 "pen " | SUBCUTANEOUS | 11 refills | Status: DC

## 2019-04-22 ENCOUNTER — Telehealth (HOSPITAL_COMMUNITY): Payer: Self-pay

## 2019-04-22 ENCOUNTER — Other Ambulatory Visit: Payer: Self-pay

## 2019-04-22 NOTE — Telephone Encounter (Signed)
Encounter complete. 

## 2019-04-27 ENCOUNTER — Telehealth: Payer: Self-pay | Admitting: Cardiovascular Disease

## 2019-04-27 MED ORDER — REPATHA SURECLICK 140 MG/ML ~~LOC~~ SOAJ: 140.0000 mg | SUBCUTANEOUS | 11 refills | Status: DC

## 2019-04-27 NOTE — Telephone Encounter (Signed)
New message:      Caren Griffins from [facility] concerning some medication.

## 2019-04-27 NOTE — Telephone Encounter (Signed)
Called and spoke w/pharmacy cancelled the incorrect rx then sent the one that was accurate

## 2019-04-27 NOTE — Telephone Encounter (Signed)
Spoke with Caren Griffins at the [facility]. She wants to clarify Repatha dosage because it states to take 2 pens every 2 weeks. Will route to Pharm D for review.   Callback number is [phone] Ref # [id number].

## 2019-04-28 ENCOUNTER — Other Ambulatory Visit: Payer: Self-pay

## 2019-04-28 ENCOUNTER — Ambulatory Visit (HOSPITAL_COMMUNITY)
Admission: RE | Admit: 2019-04-28 | Discharge: 2019-04-28 | Disposition: A | Discharge status: Home / Self Care | Payer: Medicare HMO | Source: Ambulatory Visit | Attending: Cardiology | Admitting: Cardiology

## 2019-04-28 ENCOUNTER — Ambulatory Visit (HOSPITAL_BASED_OUTPATIENT_CLINIC_OR_DEPARTMENT_OTHER)
Admission: RE | Admit: 2019-04-28 | Discharge: 2019-04-28 | Disposition: A | Discharge status: Home / Self Care | Payer: Medicare HMO | Source: Ambulatory Visit | Attending: Cardiology | Admitting: Cardiology

## 2019-04-28 DIAGNOSIS — E119 Type 2 diabetes mellitus without complications: Secondary | ICD-10-CM | POA: Insufficient documentation

## 2019-04-28 DIAGNOSIS — Z9013 Acquired absence of bilateral breasts and nipples: Secondary | ICD-10-CM | POA: Insufficient documentation

## 2019-04-28 DIAGNOSIS — Z95810 Presence of automatic (implantable) cardiac defibrillator: Secondary | ICD-10-CM | POA: Insufficient documentation

## 2019-04-28 DIAGNOSIS — R0989 Other specified symptoms and signs involving the circulatory and respiratory systems: Secondary | ICD-10-CM

## 2019-04-28 DIAGNOSIS — I252 Old myocardial infarction: Secondary | ICD-10-CM | POA: Diagnosis not present

## 2019-04-28 DIAGNOSIS — I25700 Atherosclerosis of coronary artery bypass graft(s), unspecified, with unstable angina pectoris: Secondary | ICD-10-CM | POA: Insufficient documentation

## 2019-04-28 DIAGNOSIS — I110 Hypertensive heart disease with heart failure: Secondary | ICD-10-CM | POA: Diagnosis not present

## 2019-04-28 DIAGNOSIS — R5383 Other fatigue: Secondary | ICD-10-CM | POA: Insufficient documentation

## 2019-04-28 DIAGNOSIS — R0602 Shortness of breath: Secondary | ICD-10-CM | POA: Diagnosis not present

## 2019-04-28 DIAGNOSIS — I480 Paroxysmal atrial fibrillation: Secondary | ICD-10-CM | POA: Diagnosis not present

## 2019-04-28 DIAGNOSIS — R002 Palpitations: Secondary | ICD-10-CM | POA: Insufficient documentation

## 2019-04-28 DIAGNOSIS — Z9882 Breast implant status: Secondary | ICD-10-CM | POA: Insufficient documentation

## 2019-04-28 DIAGNOSIS — I509 Heart failure, unspecified: Secondary | ICD-10-CM | POA: Insufficient documentation

## 2019-04-28 DIAGNOSIS — Z8249 Family history of ischemic heart disease and other diseases of the circulatory system: Secondary | ICD-10-CM | POA: Diagnosis not present

## 2019-04-28 DIAGNOSIS — Z853 Personal history of malignant neoplasm of breast: Secondary | ICD-10-CM | POA: Insufficient documentation

## 2019-04-28 LAB — MYOCARDIAL PERFUSION IMAGING
LV dias vol: 85 mL (ref 46–106)
LV sys vol: 42 mL
Peak HR: 123 {beats}/min
Rest HR: 87 {beats}/min
SDS: 3
SRS: 1
SSS: 4
TID: 1.04

## 2019-04-28 MED ORDER — REGADENOSON 0.4 MG/5ML IV SOLN
0.4000 mg | Freq: Once | INTRAVENOUS | Status: AC
  Administered 2019-04-28: 0.4 mg via INTRAVENOUS

## 2019-04-28 MED ORDER — TECHNETIUM TC 99M TETROFOSMIN IV KIT
30.8000 | PACK | Freq: Once | INTRAVENOUS | Status: AC | PRN
  Administered 2019-04-28: 30.8 via INTRAVENOUS
  Filled 2019-04-28: qty 31

## 2019-04-28 MED ORDER — TECHNETIUM TC 99M TETROFOSMIN IV KIT
10.9000 | PACK | Freq: Once | INTRAVENOUS | Status: AC | PRN
  Administered 2019-04-28: 10.9 via INTRAVENOUS
  Filled 2019-04-28: qty 11

## 2019-04-29 ENCOUNTER — Other Ambulatory Visit: Payer: Self-pay | Admitting: *Deleted

## 2019-04-29 MED ORDER — ISOSORBIDE MONONITRATE ER 30 MG PO TB24: 30.0000 mg | ORAL_TABLET | Freq: Every day | ORAL | 1 refills | Status: DC

## 2019-05-11 ENCOUNTER — Ambulatory Visit (INDEPENDENT_AMBULATORY_CARE_PROVIDER_SITE_OTHER): Payer: Medicare HMO | Admitting: *Deleted

## 2019-05-11 DIAGNOSIS — I480 Paroxysmal atrial fibrillation: Secondary | ICD-10-CM

## 2019-05-12 DIAGNOSIS — E113391 Type 2 diabetes mellitus with moderate nonproliferative diabetic retinopathy without macular edema, right eye: Secondary | ICD-10-CM | POA: Diagnosis not present

## 2019-05-12 DIAGNOSIS — E113312 Type 2 diabetes mellitus with moderate nonproliferative diabetic retinopathy with macular edema, left eye: Secondary | ICD-10-CM | POA: Diagnosis not present

## 2019-05-12 DIAGNOSIS — H35372 Puckering of macula, left eye: Secondary | ICD-10-CM | POA: Diagnosis not present

## 2019-05-12 DIAGNOSIS — Z961 Presence of intraocular lens: Secondary | ICD-10-CM | POA: Diagnosis not present

## 2019-05-12 DIAGNOSIS — H35033 Hypertensive retinopathy, bilateral: Secondary | ICD-10-CM | POA: Diagnosis not present

## 2019-05-12 LAB — CUP PACEART REMOTE DEVICE CHECK
Battery Remaining Longevity: 26 mo
Battery Remaining Percentage: 33 %
Battery Voltage: 2.83 V
Brady Statistic AP VP Percent: 1 %
Brady Statistic AP VS Percent: 1 %
Brady Statistic AS VP Percent: 99 %
Brady Statistic AS VS Percent: 1 %
Brady Statistic RA Percent Paced: 1 %
Date Time Interrogation Session: 20210302072646
Implantable Lead Implant Date: 20140721
Implantable Lead Implant Date: 20141117
Implantable Lead Implant Date: 20141117
Implantable Lead Location: 753858
Implantable Lead Location: 753859
Implantable Lead Location: 753860
Implantable Lead Model: 5071
Implantable Pulse Generator Implant Date: 20141117
Lead Channel Impedance Value: 400 Ohm
Lead Channel Impedance Value: 400 Ohm
Lead Channel Impedance Value: 600 Ohm
Lead Channel Pacing Threshold Amplitude: 0.5 V
Lead Channel Pacing Threshold Amplitude: 0.75 V
Lead Channel Pacing Threshold Amplitude: 2.25 V
Lead Channel Pacing Threshold Pulse Width: 0.4 ms
Lead Channel Pacing Threshold Pulse Width: 0.4 ms
Lead Channel Pacing Threshold Pulse Width: 0.7 ms
Lead Channel Sensing Intrinsic Amplitude: 1.2 mV
Lead Channel Sensing Intrinsic Amplitude: 12 mV
Lead Channel Setting Pacing Amplitude: 2 V
Lead Channel Setting Pacing Amplitude: 2.5 V
Lead Channel Setting Pacing Amplitude: 2.5 V
Lead Channel Setting Pacing Pulse Width: 0.4 ms
Lead Channel Setting Pacing Pulse Width: 0.7 ms
Lead Channel Setting Sensing Sensitivity: 2 mV
Pulse Gen Model: 3222
Pulse Gen Serial Number: 2981305

## 2019-05-12 NOTE — Progress Notes (Signed)
PPM Remote  

## 2019-06-09 ENCOUNTER — Encounter: Payer: Self-pay | Admitting: Internal Medicine

## 2019-07-07 DIAGNOSIS — H524 Presbyopia: Secondary | ICD-10-CM | POA: Diagnosis not present

## 2019-07-07 DIAGNOSIS — H52222 Regular astigmatism, left eye: Secondary | ICD-10-CM | POA: Diagnosis not present

## 2019-07-21 ENCOUNTER — Other Ambulatory Visit: Payer: Self-pay

## 2019-07-21 ENCOUNTER — Emergency Department (HOSPITAL_COMMUNITY)
Admission: EM | Admit: 2019-07-21 | Discharge: 2019-07-21 | Disposition: A | Discharge status: Home / Self Care | Payer: Medicare HMO | Attending: Emergency Medicine | Admitting: Emergency Medicine

## 2019-07-21 ENCOUNTER — Emergency Department (HOSPITAL_COMMUNITY): Payer: Medicare HMO

## 2019-07-21 ENCOUNTER — Encounter (HOSPITAL_COMMUNITY): Payer: Self-pay | Admitting: Pharmacy Technician

## 2019-07-21 DIAGNOSIS — I110 Hypertensive heart disease with heart failure: Secondary | ICD-10-CM | POA: Insufficient documentation

## 2019-07-21 DIAGNOSIS — Z79899 Other long term (current) drug therapy: Secondary | ICD-10-CM | POA: Diagnosis not present

## 2019-07-21 DIAGNOSIS — Z7901 Long term (current) use of anticoagulants: Secondary | ICD-10-CM | POA: Insufficient documentation

## 2019-07-21 DIAGNOSIS — Z7984 Long term (current) use of oral hypoglycemic drugs: Secondary | ICD-10-CM | POA: Insufficient documentation

## 2019-07-21 DIAGNOSIS — R079 Chest pain, unspecified: Secondary | ICD-10-CM | POA: Insufficient documentation

## 2019-07-21 DIAGNOSIS — I252 Old myocardial infarction: Secondary | ICD-10-CM | POA: Insufficient documentation

## 2019-07-21 DIAGNOSIS — Z9049 Acquired absence of other specified parts of digestive tract: Secondary | ICD-10-CM | POA: Insufficient documentation

## 2019-07-21 DIAGNOSIS — R0789 Other chest pain: Secondary | ICD-10-CM

## 2019-07-21 DIAGNOSIS — I5040 Unspecified combined systolic (congestive) and diastolic (congestive) heart failure: Secondary | ICD-10-CM | POA: Diagnosis not present

## 2019-07-21 DIAGNOSIS — Z950 Presence of cardiac pacemaker: Secondary | ICD-10-CM | POA: Insufficient documentation

## 2019-07-21 DIAGNOSIS — I2510 Atherosclerotic heart disease of native coronary artery without angina pectoris: Secondary | ICD-10-CM | POA: Diagnosis not present

## 2019-07-21 DIAGNOSIS — Z87891 Personal history of nicotine dependence: Secondary | ICD-10-CM | POA: Insufficient documentation

## 2019-07-21 DIAGNOSIS — E119 Type 2 diabetes mellitus without complications: Secondary | ICD-10-CM | POA: Insufficient documentation

## 2019-07-21 DIAGNOSIS — Z853 Personal history of malignant neoplasm of breast: Secondary | ICD-10-CM | POA: Insufficient documentation

## 2019-07-21 LAB — BASIC METABOLIC PANEL
Anion gap: 13 (ref 5–15)
BUN: 14 mg/dL (ref 8–23)
CO2: 24 mmol/L (ref 22–32)
Calcium: 10.2 mg/dL (ref 8.9–10.3)
Chloride: 100 mmol/L (ref 98–111)
Creatinine, Ser: 0.69 mg/dL (ref 0.44–1.00)
GFR calc Af Amer: >60 mL/min (ref >60)
GFR calc non Af Amer: >60 mL/min (ref >60)
Glucose, Bld: 134 mg/dL — ABNORMAL HIGH (ref 70–99)
Potassium: 4.4 mmol/L (ref 3.5–5.1)
Sodium: 137 mmol/L (ref 135–145)

## 2019-07-21 LAB — CBC
HCT: 40.5 % (ref 36.0–46.0)
Hemoglobin: 13.1 g/dL (ref 12.0–15.0)
MCH: 30.7 pg (ref 26.0–34.0)
MCHC: 32.3 g/dL (ref 30.0–36.0)
MCV: 94.8 fL (ref 80.0–100.0)
Platelets: 230 10*3/uL (ref 150–400)
RBC: 4.27 MIL/uL (ref 3.87–5.11)
RDW: 12.5 % (ref 11.5–15.5)
WBC: 6.6 10*3/uL (ref 4.0–10.5)
nRBC: 0 % (ref 0.0–0.2)

## 2019-07-21 LAB — TROPONIN I (HIGH SENSITIVITY)
Troponin I (High Sensitivity): 5 ng/L (ref <18)
Troponin I (High Sensitivity): 6 ng/L (ref <18)

## 2019-07-21 MED ORDER — SODIUM CHLORIDE 0.9% FLUSH: 3.0000 mL | Freq: Once | INTRAVENOUS | Status: DC

## 2019-07-21 NOTE — ED Provider Notes (Signed)
[facility] EMERGENCY DEPARTMENT Provider Note   CSN: [id number] Arrival date & time: 07/21/19  1525     History Chief Complaint  Patient presents with  . Chest Pain    [name] is a 69 y.o. female.  HPI    Patient with multiple medical issues including prior MI, now with pacemaker present presents after an episode of chest pain which has improved substantially prior to my evaluation. Line she notes that she was in her usual state of health, at rest, talking on a video call with a friend when she felt sudden onset chest tightness. On over some radiation superiorly. After onset patient took nitroglycerin, and pain is subsided substantially. No recent changes in medicine, activity. Patient does take Xarelto, states that she has been doing so regularly.  History obtained by the patient and chart. Per cardiology notes patient has had somewhat similar episode of the past few months, has been seen and evaluated by cardiology. She has an implanted defibrillator, pacemaker, which has been interrogated, without recent events.   Past Medical History:  Diagnosis Date  . AICD (automatic cardioverter/defibrillator) present 01/2013   Biventricular cardiac pacemaker in situ   . Allergic rhinitis   . CAD (coronary artery disease)    a. 2004: s/p MI in [place]. No PCI->Medical RX;  b. 07/2012 Cath: LM 30-40, LAD 70p, 70/78m D1 80-90p, OM1 small 90p, OM2 large 80-90p, 579m70-80d, RCA 20-30 diff, EF 40%, glob HK.s/p CABG  . [facility]   Patient reports left breast cancer diagnosis in 2002 treated with bilateral mastectomy positive lymph nodes with left axillary dissection followed by chemotherapy of unknown type  . Cataract   . Diabetes mellitus type II   . Exertional shortness of breath   . Hyperlipidemia   . Hypertension   . Ischemic cardiomyopathy    a. 07/2012 Echo: EF 35%, Sev inferoseptal HK, mildly dil LA, Peak PASP 5953m.  . LMarland KitchenBB (left  bundle branch block)    a. intermittent - present during rapid afib 07/2012.  . NMarland Kitchenuromuscular disorder (HCC[place]  Patient reports chronic numbness in the right foot related to previous surgery on the right leg and "nerve damage"  . PAF (paroxysmal atrial fibrillation) ([place]CC[place]  a. 07/2012: Amio and xarelto initiated.  . [facility]) [facility]   Patient Active Problem List   Diagnosis Date Noted  . SOB (shortness of breath) 02/26/2018  . Vitamin D deficiency 02/22/2016  . Long term current use of anticoagulant 11/10/2015  . Type 2 diabetes mellitus with circulatory disorder, without long-term current use of insulin (HCC[place]7/25/2017  . Chronic pain disorder 10/03/2015  . Generalized osteoarthrosis, involving multiple sites 08/25/2015  . Other fatigue 08/11/2015  . Back pain, chronic 08/11/2015  . Incisional hernia 05/25/2015  . Incisional hernia, without obstruction or gangrene 05/04/2015  . Exertional dyspnea 08/05/2014  . Routine general medical examination at a health care facility 04/14/2014  . Anxiety state, unspecified 05/13/2013  . Acute on chronic combined systolic and diastolic CHF, NYHA class 3 (HCCLittle Brow[place]1/11/2013  . High anion gap metabolic acidosis 01/94/09/6806 Biventricular cardiac pacemaker - [employer], Nov 2014 03/04/2013  . Chronic combined systolic and diastolic CHF, NYHA class 2 (HCCBeech [place]0/31/2014  . S/P CABG x 4: 09/28/12 (LIMA-LAD, SVG-OM1-OM2, SVG-Intermediate) 09/29/2012  . Right carotid bruit 08/11/2012  . PAF (paroxysmal atrial fibrillation) (HCCKol[place]ane[place]5/13/2014  . Ischemic cardiomyopathy   . CAD (coronary artery  disease)   . Anogenital warts 01/22/2011  . Hyperlipidemia 09/19/2009  . Essential hypertension 09/19/2009  . MYOCARDIAL INFARCTION, HX OF 09/19/2009  . Allergic rhinitis 09/19/2009  . BREAST CANCER, HX OF 09/19/2009    Past Surgical History:  Procedure Laterality Date  . ABDOMINAL HYSTERECTOMY  2000  . APPENDECTOMY  1974  . BI-VENTRICULAR  PACEMAKER INSERTION N/A 01/25/2013   Procedure: BI-VENTRICULAR PACEMAKER INSERTION (CRT-P);  Surgeon: Evans Lance, MD;  Location[facility] LAB;  Service: Cardiovascular;  Laterality: N/A;  . BREAST BIOPSY Left 2002  . CARDIAC CATHETERIZATION    . CHOLECYSTECTOMY OPEN  1974  . CORONARY ARTERY BYPASS GRAFT N/A 09/28/2012   Procedure: CORONARY ARTERY BYPASS GRAFTING (CABG);  Surgeon: [name], MD;  Location: [facility];  Service: Open Heart Surgery;  Laterality: N/A;  CABG x four, using left internal mammary artery and left leg greater saphenous vein harvested endoscopically  . DILATION AND CURETTAGE OF UTERUS  1983  . EPICARDIAL PACING LEAD PLACEMENT N/A 09/28/2012   Procedure: EPICARDIAL PACING LEAD PLACEMENT;  Surgeon: [name], MD;  Location: [facility];  Service: Thoracic;  Laterality: N/A;  LV LEAD PLACEMENT  . HERNIA REPAIR    . INCISIONAL HERNIA REPAIR N/A 05/25/2015   Procedure: LAPAROSCOPIC INCISIONAL HERNIA WITH MESH ;  Surgeon: [name], MD;  Location: [facility];  Service: General;  Laterality: N/A;  . INCONTINENCE SURGERY  2000  . INSERTION OF MESH N/A 05/25/2015   Procedure: INSERTION OF MESH;  Surgeon: [name], MD;  Location: [facility] [place];  Service: General;  Laterality: N/A;  . INTRAOPERATIVE TRANSESOPHAGEAL ECHOCARDIOGRAM N/A 09/28/2012   Procedure: INTRAOPERATIVE TRANSESOPHAGEAL ECHOCARDIOGRAM;  Surgeon: [name], MD;  Location: [facility];  Service: Open Heart Surgery;  Laterality: N/A;  . LAPAROSCOPIC INCISIONAL / UMBILICAL / VENTRAL HERNIA REPAIR  05/25/2015   IHR  . LEFT HEART CATHETERIZATION WITH CORONARY ANGIOGRAM N/A 07/20/2012   Procedure: LEFT HEART CATHETERIZATION WITH CORONARY ANGIOGRAM;  Surgeon: [name], MD;  Location[facility] LAB;  Service: Cardiovascular;  Laterality: N/A;  . MASTECTOMY Right 2002  . MASTECTOMY MODIFIED RADICAL W/ AXILLARY LYMPH NODES W/ OR W/O PECTORALIS MINOR Left 2002  . MAZE N/A 09/28/2012   Procedure: MAZE;  Surgeon:  [name], MD;  Location: [facility];  Service: Open Heart Surgery;  Laterality: N/A;  . ORIF WRIST FRACTURE Right 11/08/2017   Procedure: OPEN REDUCTION INTERNAL FIXATION (ORIF) WRIST FRACTURE;  Surgeon: [name], MD;  Location: [facility];  Service: Orthopedics;  Laterality: Right;  90 mins  . TENDON REPAIR Right 2001 X 3-4   torn ligaments and tendons in ankle up to knee from work related accident  . TUBAL LIGATION  1987     OB History   No obstetric history on file.     Family History  Problem Relation Age of Onset  . Kidney failure Mother   . Heart disease Mother        Died mid 90W complications of diabetes  . Colon cancer Sister   . Arthritis Other   . Cancer Other        ovarian  . Diabetes Other   . Hyperlipidemia Other   . Liver cancer Brother   . Stomach cancer Neg Hx   . Rectal cancer Neg Hx   . Esophageal cancer Neg Hx     Social History   Tobacco Use  . Smoking status: Former Smoker    Packs/day: 1.00    Years:  10.00    Pack years: 10.00    Types: Cigarettes    Quit date: 03/11/2012    Years since quitting: 7.3  . Smokeless tobacco: Never Used  Substance Use Topics  . Alcohol use: No  . Drug use: No    Home Medications Prior to Admission medications   Medication Sig Start Date End Date Taking? Authorizing Provider  APPLE CIDER VINEGAR PO Take 1 tablet by mouth in the morning, at noon, and at bedtime.   Yes [provider]  Blood Glucose Monitoring Suppl (BAYER CONTOUR NEXT LINK) w/Device KIT Use to check blood sugar 2 times per day. 08/11/15  Yes Renato Shin, MD  carvedilol (COREG) 12.5 MG tablet Take 1 tablet (12.5 mg total) by mouth 2 (two) times daily with a meal. 04/15/19  Yes Croitoru, Mihai, MD  Dulaglutide (TRULICITY) 1.5 ST/4.1DQ SOPN INJECT 1.5 MG INTO THE SKIN ONCE A WEEK. 04/14/19  Yes Philemon Kingdom, MD  empagliflozin (JARDIANCE) 25 MG TABS tablet Take 25 mg by mouth daily before breakfast. 04/14/19  Yes Philemon Kingdom, MD    Evolocumab (REPATHA SURECLICK) 222 MG/ML SOAJ Inject 140 mg into the skin every 14 (fourteen) days. 04/27/19  Yes Croitoru, Mihai, MD  furosemide (LASIX) 20 MG tablet TAKE 1 TABLET(20 MG) BY MOUTH DAILY 04/15/19  Yes Croitoru, Mihai, MD  glipiZIDE (GLUCOTROL) 5 MG tablet TAKE ONE TABLET BY MOUTH TWICE A DAY BEFORE A MEAL 04/14/19  Yes Philemon Kingdom, MD  glucose blood (CONTOUR NEXT TEST) test strip Use as instructed to check sugar 2 times daily 01/08/17  Yes Philemon Kingdom, MD  isosorbide mononitrate (IMDUR) 30 MG 24 hr tablet Take 1 tablet (30 mg total) by mouth daily. 04/29/19 07/28/19 Yes Croitoru, Mihai, MD  losartan (COZAAR) 25 MG tablet Take 1 tablet (25 mg total) by mouth 2 (two) times daily. 04/15/19  Yes Croitoru, Mihai, MD  metFORMIN (GLUCOPHAGE) 1000 MG tablet TAKE ONE TABLET BY MOUTH TWICE A DAY WITH FOOD 04/14/19  Yes Philemon Kingdom, MD  nitroGLYCERIN (NITROSTAT) 0.4 MG SL tablet Place 1 tablet (0.4 mg total) under the tongue every 5 (five) minutes as needed for chest pain. 04/19/19  Yes Croitoru, Mihai, MD  rivaroxaban (XARELTO) 20 MG TABS tablet Take 1 tablet (20 mg total) by mouth daily with supper. 04/14/19  Yes Croitoru, Dani Gobble, MD  BAYER MICROLET LANCETS lancets Use as instructed Patient not taking: Reported on 07/21/2019 03/12/16   Philemon Kingdom, MD  Clotrimazole 1 % OINT Apply to vulvar area twice a day on clean, dry skin Patient not taking: Reported on 07/21/2019 01/07/19   Isaac Bliss, Rayford Halsted, MD  nystatin (MYCOSTATIN/NYSTOP) powder Apply topically 4 (four) times daily. Patient not taking: Reported on 07/21/2019 01/07/19   Isaac Bliss, Rayford Halsted, MD    Allergies    Statins  Review of Systems   Review of Systems  Constitutional:       Per HPI, otherwise negative  HENT:       Per HPI, otherwise negative  Respiratory:       Per HPI, otherwise negative  Cardiovascular:       Per HPI, otherwise negative  Gastrointestinal: Negative for vomiting.  Endocrine:        Negative aside from HPI  Genitourinary:       Neg aside from HPI   Musculoskeletal:       Per HPI, otherwise negative  Skin: Negative.   Neurological: Negative for syncope.    Physical Exam Updated Vital Signs BP Marland Kitchen)  155/85 (BP Location: Right Arm)   Pulse 74   Temp 98.1 F (36.7 C) (Oral)   Resp 16   SpO2 99%   Physical Exam Vitals and nursing note reviewed.  Constitutional:      General: She is not in acute distress.    Appearance: She is well-developed.  HENT:     Head: Normocephalic and atraumatic.  Eyes:     Conjunctiva/sclera: Conjunctivae normal.  Cardiovascular:     Rate and Rhythm: Normal rate and regular rhythm.  Pulmonary:     Effort: Pulmonary effort is normal. No respiratory distress.     Breath sounds: Normal breath sounds. No stridor.  Abdominal:     General: There is no distension.  Skin:    General: Skin is warm and dry.  Neurological:     Mental Status: She is alert and oriented to person, place, and time.     Cranial Nerves: No cranial nerve deficit.     ED Results / Procedures / Treatments   Labs (all labs ordered are listed, but only abnormal results are displayed) Labs Reviewed  BASIC METABOLIC PANEL - Abnormal; Notable for the following components:      Result Value   Glucose, Bld 134 (*)    All other components within normal limits  CBC  TROPONIN I (HIGH SENSITIVITY)  TROPONIN I (HIGH SENSITIVITY)    EKG EKG Interpretation  Date/Time:  Wednesday Jul 21 2019 15:23:46 EDT Ventricular Rate:  87 PR Interval:  160 QRS Duration: 142 QT Interval:  450 QTC Calculation: 541 R Axis:   -46 Text Interpretation: Atrial-sensed ventricular-paced rhythm Abnormal ECG Confirmed by Carmin Muskrat [id number]) on 07/21/2019 6:25:05 PM   Radiology DG Chest 2 View  Result Date: 07/21/2019 CLINICAL DATA:  Right-sided chest pain x1 day. EXAM: CHEST - 2 VIEW COMPARISON:  February 16, 2018 FINDINGS: Multiple sternal wires are seen. A dual lead AICD is  noted. The lungs are hyperinflated. There is no evidence of acute infiltrate, pleural effusion or pneumothorax. The heart size and mediastinal contours are within normal limits. Radiopaque surgical clips are seen overlying the bilateral axilla. The visualized skeletal structures are unremarkable. IMPRESSION: 1. Evidence of prior median sternotomy/CABG. 2. No acute or active cardiopulmonary disease. Electronically Signed   By: Virgina Norfolk M.D.   On: 07/21/2019 16:21    Procedures Procedures (including critical care time)  Medications Ordered in ED Medications  sodium chloride flush (NS) 0.9 % injection 3 mL (3 mLs Intravenous Not Given 07/21/19 1959)    ED Course  I have reviewed the triage vital signs and the nursing notes.  Pertinent labs & imaging results that were available during my care of the patient were reviewed by me and considered in my medical decision making (see chart for details).  9:37 PM Patient in no distress, awake, alert, speaking clearly.  She is hemodynamically unremarkable. She is now accompanied by her husband. Together we discussed all findings including 2 normal troponin, nonischemic EKG, and pacemaker which has been interrogated without alarming findings. Though the patient does have elevated heart score, she has no ongoing pain, and findings are reassuring, with low suspicion for ACS. We discussed admission for further monitoring versus close outpatient follow-up with her cardiologist.  She is amenable to the latter option, is appropriate for as well, given the aforementioned reassuring findings.  Final Clinical Impression(s) / ED Diagnoses Final diagnoses:  Atypical chest pain     Carmin Muskrat, MD 07/21/19 2139

## 2019-07-21 NOTE — ED Triage Notes (Signed)
Pt arrives pov with chest heaviness with shob. States took 1 nitro pta with relief. Pain 6/10. Extensive cardiac hx.

## 2019-07-21 NOTE — Discharge Instructions (Addendum)
As discussed, your evaluation today has been largely reassuring.  But, it is important that you monitor your condition carefully, and do not hesitate to return to the ED if you develop new, or concerning changes in your condition. ? ?Otherwise, please follow-up with your physician for appropriate ongoing care. ? ?

## 2019-07-23 ENCOUNTER — Telehealth: Payer: Self-pay | Admitting: *Deleted

## 2019-07-23 MED ORDER — ISOSORBIDE MONONITRATE ER 60 MG PO TB24: 60.0000 mg | ORAL_TABLET | Freq: Every day | ORAL | 1 refills | Status: DC

## 2019-07-23 NOTE — Telephone Encounter (Signed)
Spoke with the patient. Appointment made for 6/28 with Dr. Sallyanne Kuster.

## 2019-07-23 NOTE — Telephone Encounter (Signed)
Spoke with the patient. Since she is on already Imdur 30 mg, Dr. Sallyanne Kuster has recommended she increase it to 60 mg.   A new script has been sent in. She has been advised to keep [place] updated on how she is feeling.

## 2019-07-23 NOTE — Telephone Encounter (Signed)
-----   Message from [name], MD sent at 07/22/2019  7:45 AM EDT ----- Please tell her I reviewed the ER records and agree there is no cause for immediate concern. Please start isosorbide mononitrate 30 mg daily to see if it helps prevent these events and arrange follow up in 1-2 months.

## 2019-08-03 ENCOUNTER — Other Ambulatory Visit: Payer: Self-pay | Admitting: Internal Medicine

## 2019-08-10 ENCOUNTER — Other Ambulatory Visit: Payer: Self-pay

## 2019-08-10 ENCOUNTER — Ambulatory Visit (INDEPENDENT_AMBULATORY_CARE_PROVIDER_SITE_OTHER): Payer: Medicare HMO | Admitting: *Deleted

## 2019-08-10 DIAGNOSIS — I255 Ischemic cardiomyopathy: Secondary | ICD-10-CM

## 2019-08-10 DIAGNOSIS — Z45018 Encounter for adjustment and management of other part of cardiac pacemaker: Secondary | ICD-10-CM

## 2019-08-10 LAB — CUP PACEART REMOTE DEVICE CHECK
Battery Remaining Longevity: 21 mo
Battery Remaining Percentage: 28 %
Battery Voltage: 2.81 V
Brady Statistic AP VP Percent: 1 %
Brady Statistic AP VS Percent: 1 %
Brady Statistic AS VP Percent: 99 %
Brady Statistic AS VS Percent: 1 %
Brady Statistic RA Percent Paced: 1 %
Date Time Interrogation Session: 20210601020013
Implantable Lead Implant Date: 20140721
Implantable Lead Implant Date: 20141117
Implantable Lead Implant Date: 20141117
Implantable Lead Location: 753858
Implantable Lead Location: 753859
Implantable Lead Location: 753860
Implantable Lead Model: 5071
Implantable Pulse Generator Implant Date: 20141117
Lead Channel Impedance Value: 390 Ohm
Lead Channel Impedance Value: 390 Ohm
Lead Channel Impedance Value: 530 Ohm
Lead Channel Pacing Threshold Amplitude: 0.5 V
Lead Channel Pacing Threshold Amplitude: 0.75 V
Lead Channel Pacing Threshold Amplitude: 2.25 V
Lead Channel Pacing Threshold Pulse Width: 0.4 ms
Lead Channel Pacing Threshold Pulse Width: 0.4 ms
Lead Channel Pacing Threshold Pulse Width: 0.7 ms
Lead Channel Sensing Intrinsic Amplitude: 1.1 mV
Lead Channel Sensing Intrinsic Amplitude: 11.5 mV
Lead Channel Setting Pacing Amplitude: 2 V
Lead Channel Setting Pacing Amplitude: 2.5 V
Lead Channel Setting Pacing Amplitude: 2.5 V
Lead Channel Setting Pacing Pulse Width: 0.4 ms
Lead Channel Setting Pacing Pulse Width: 0.7 ms
Lead Channel Setting Sensing Sensitivity: 2 mV
Pulse Gen Model: 3222
Pulse Gen Serial Number: 2981305

## 2019-08-10 MED ORDER — TRUEPLUS LANCETS 33G MISC: 12 refills | Status: DC

## 2019-08-10 MED ORDER — TRUE METRIX BLOOD GLUCOSE TEST VI STRP: ORAL_STRIP | 12 refills | Status: DC

## 2019-08-10 MED ORDER — TRUE METRIX AIR GLUCOSE METER DEVI: 0 refills | Status: DC

## 2019-08-11 NOTE — Progress Notes (Signed)
Remote pacemaker transmission.   

## 2019-08-12 ENCOUNTER — Other Ambulatory Visit: Payer: Self-pay | Admitting: *Deleted

## 2019-08-12 MED ORDER — CARVEDILOL 12.5 MG PO TABS: 12.5000 mg | ORAL_TABLET | Freq: Two times a day (BID) | ORAL | 3 refills | Status: DC

## 2019-08-12 MED ORDER — RIVAROXABAN 20 MG PO TABS: 20.0000 mg | ORAL_TABLET | Freq: Every day | ORAL | 3 refills | Status: DC

## 2019-08-12 NOTE — Telephone Encounter (Signed)
Rx has been sent to the pharmacy electronically. ° °

## 2019-08-17 ENCOUNTER — Emergency Department (HOSPITAL_COMMUNITY)
Admission: EM | Admit: 2019-08-17 | Discharge: 2019-08-17 | Disposition: A | Discharge status: Home / Self Care | Payer: Medicare HMO | Attending: Emergency Medicine | Admitting: Emergency Medicine

## 2019-08-17 ENCOUNTER — Encounter (HOSPITAL_COMMUNITY): Payer: Self-pay

## 2019-08-17 DIAGNOSIS — Y929 Unspecified place or not applicable: Secondary | ICD-10-CM | POA: Diagnosis not present

## 2019-08-17 DIAGNOSIS — Z79899 Other long term (current) drug therapy: Secondary | ICD-10-CM | POA: Diagnosis not present

## 2019-08-17 DIAGNOSIS — Z853 Personal history of malignant neoplasm of breast: Secondary | ICD-10-CM | POA: Diagnosis not present

## 2019-08-17 DIAGNOSIS — T24212A Burn of second degree of left thigh, initial encounter: Secondary | ICD-10-CM | POA: Diagnosis not present

## 2019-08-17 DIAGNOSIS — X102XXA Contact with fats and cooking oils, initial encounter: Secondary | ICD-10-CM | POA: Insufficient documentation

## 2019-08-17 DIAGNOSIS — Z7984 Long term (current) use of oral hypoglycemic drugs: Secondary | ICD-10-CM | POA: Insufficient documentation

## 2019-08-17 DIAGNOSIS — I10 Essential (primary) hypertension: Secondary | ICD-10-CM | POA: Insufficient documentation

## 2019-08-17 DIAGNOSIS — T310 Burns involving less than 10% of body surface: Secondary | ICD-10-CM | POA: Diagnosis not present

## 2019-08-17 DIAGNOSIS — Z23 Encounter for immunization: Secondary | ICD-10-CM | POA: Insufficient documentation

## 2019-08-17 DIAGNOSIS — E119 Type 2 diabetes mellitus without complications: Secondary | ICD-10-CM | POA: Diagnosis not present

## 2019-08-17 DIAGNOSIS — Y999 Unspecified external cause status: Secondary | ICD-10-CM | POA: Diagnosis not present

## 2019-08-17 DIAGNOSIS — Y93G3 Activity, cooking and baking: Secondary | ICD-10-CM | POA: Insufficient documentation

## 2019-08-17 DIAGNOSIS — I2510 Atherosclerotic heart disease of native coronary artery without angina pectoris: Secondary | ICD-10-CM | POA: Diagnosis not present

## 2019-08-17 MED ORDER — SILVER SULFADIAZINE 1 % EX CREA
TOPICAL_CREAM | Freq: Once | CUTANEOUS | Status: AC
  Filled 2019-08-17: qty 85

## 2019-08-17 MED ORDER — SILVER SULFADIAZINE 1 % EX CREA
1.0000 | TOPICAL_CREAM | Freq: Two times a day (BID) | CUTANEOUS | 0 refills | Status: DC
Start: 2019-08-17 — End: 2019-09-01

## 2019-08-17 MED ORDER — TETANUS-DIPHTH-ACELL PERTUSSIS 5-2.5-18.5 LF-MCG/0.5 IM SUSP
0.5000 mL | Freq: Once | INTRAMUSCULAR | Status: AC
  Administered 2019-08-17: 0.5 mL via INTRAMUSCULAR
  Filled 2019-08-17: qty 0.5

## 2019-08-17 MED ORDER — OXYCODONE-ACETAMINOPHEN 5-325 MG PO TABS
2.0000 | ORAL_TABLET | Freq: Once | ORAL | Status: AC
  Administered 2019-08-17: 2 via ORAL
  Filled 2019-08-17: qty 2

## 2019-08-17 MED ORDER — OXYCODONE-ACETAMINOPHEN 5-325 MG PO TABS: 1.0000 | ORAL_TABLET | Freq: Four times a day (QID) | ORAL | 0 refills | Status: DC | PRN
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
## 2019-08-17 NOTE — Discharge Instructions (Addendum)
Take motrin for pain. Take percocet for severe pain. Do NOT drive with it   Apply silverdene cream once or twice daily for severe days. Expect more blisters coming up   See your primary care doctor. Consider follow up with plastic surgeon for wound care   Return to ER if you have uncontrolled pain, fever, purulent discharge from the wound.
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
## 2019-08-17 NOTE — ED Notes (Signed)
Left leg and right hand both bandaged with non adherent pads and curlex wrap.

## 2019-08-23 ENCOUNTER — Institutional Professional Consult (permissible substitution): Payer: Medicare HMO | Admitting: Internal Medicine

## 2019-08-25 ENCOUNTER — Ambulatory Visit (INDEPENDENT_AMBULATORY_CARE_PROVIDER_SITE_OTHER): Payer: Medicare HMO | Admitting: Internal Medicine

## 2019-08-25 ENCOUNTER — Institutional Professional Consult (permissible substitution): Payer: Medicare HMO | Admitting: Internal Medicine

## 2019-08-25 ENCOUNTER — Encounter: Payer: Self-pay | Admitting: Internal Medicine

## 2019-08-25 ENCOUNTER — Other Ambulatory Visit: Payer: Self-pay

## 2019-08-25 VITALS — BP 116/68 | HR 92 | Temp 97.7°F | Ht 64.5 in | Wt 152.8 lb

## 2019-08-25 DIAGNOSIS — T24212A Burn of second degree of left thigh, initial encounter: Secondary | ICD-10-CM

## 2019-08-25 DIAGNOSIS — T23201A Burn of second degree of right hand, unspecified site, initial encounter: Secondary | ICD-10-CM

## 2019-08-25 DIAGNOSIS — T23231A Burn of second degree of multiple right fingers (nail), not including thumb, initial encounter: Secondary | ICD-10-CM | POA: Diagnosis not present

## 2019-08-25 MED ORDER — OXYCODONE HCL 5 MG PO TABS: 5.0000 mg | ORAL_TABLET | Freq: Three times a day (TID) | ORAL | 0 refills | Status: AC | PRN
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
## 2019-08-25 NOTE — Patient Instructions (Signed)
[name] It was a pleasure meeting you today.  Please follow the instructions below for your wound care  1) xeroform dressing changes up to 2 times a day on your left leg and right hand 2) cover with non stick pad (abd pad) 3) wrap with gauze 4) cover with ace bandage   Please follow up with me in 1 week.  Call us at [phone] with any questions or concerns  [employer] is a medical supply company.  We have sent them an order for your supplies.  Please contact them if you do not receive your supplies in the next 72hrs.  Their number is [phone] and website is [email or web address]

## 2019-08-26 DIAGNOSIS — T24212A Burn of second degree of left thigh, initial encounter: Secondary | ICD-10-CM | POA: Diagnosis not present

## 2019-08-27 ENCOUNTER — Telehealth: Payer: Self-pay | Admitting: *Deleted

## 2019-08-27 NOTE — Telephone Encounter (Addendum)
Faxed order to [employer] on (08/26/19) for medical supplies for the patient.  Confirmation received.    Received Order Status Notification from [employer].  Stating:The order has been shipped to the patient today.  [employer] verified the patient's coverage criteria and provided service to your patient with products that maximize the patient's benefits and limit out of pocket costs.  Scanned into the chart.//AB/CMA

## 2019-09-01 ENCOUNTER — Ambulatory Visit (INDEPENDENT_AMBULATORY_CARE_PROVIDER_SITE_OTHER): Payer: Medicare HMO | Admitting: Internal Medicine

## 2019-09-01 ENCOUNTER — Other Ambulatory Visit: Payer: Self-pay

## 2019-09-01 VITALS — BP 134/75 | HR 86 | Temp 97.5°F

## 2019-09-01 DIAGNOSIS — T23201D Burn of second degree of right hand, unspecified site, subsequent encounter: Secondary | ICD-10-CM | POA: Diagnosis not present

## 2019-09-01 DIAGNOSIS — T24212D Burn of second degree of left thigh, subsequent encounter: Secondary | ICD-10-CM

## 2019-09-01 DIAGNOSIS — T23231D Burn of second degree of multiple right fingers (nail), not including thumb, subsequent encounter: Secondary | ICD-10-CM
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
## 2019-09-03 ENCOUNTER — Telehealth: Payer: Self-pay

## 2019-09-03 NOTE — Telephone Encounter (Signed)
Patient left voicemail to state she called [facility] to order more supplies; however, they informed her she is not able to order until 07/17 due to her insurance. She would like a call back to discuss alternative wound care supply options.

## 2019-09-06 ENCOUNTER — Other Ambulatory Visit: Payer: Self-pay

## 2019-09-06 ENCOUNTER — Ambulatory Visit (INDEPENDENT_AMBULATORY_CARE_PROVIDER_SITE_OTHER): Payer: Medicare HMO | Admitting: Cardiovascular Disease

## 2019-09-06 ENCOUNTER — Encounter: Payer: Self-pay | Admitting: Cardiovascular Disease

## 2019-09-06 VITALS — BP 138/62 | HR 82 | Ht 64.5 in | Wt 153.0 lb

## 2019-09-06 DIAGNOSIS — I5032 Chronic diastolic (congestive) heart failure: Secondary | ICD-10-CM

## 2019-09-06 DIAGNOSIS — Z7901 Long term (current) use of anticoagulants: Secondary | ICD-10-CM | POA: Diagnosis not present

## 2019-09-06 DIAGNOSIS — Z950 Presence of cardiac pacemaker: Secondary | ICD-10-CM | POA: Diagnosis not present

## 2019-09-06 DIAGNOSIS — E782 Mixed hyperlipidemia: Secondary | ICD-10-CM

## 2019-09-06 DIAGNOSIS — I480 Paroxysmal atrial fibrillation: Secondary | ICD-10-CM | POA: Diagnosis not present

## 2019-09-06 DIAGNOSIS — I10 Essential (primary) hypertension: Secondary | ICD-10-CM | POA: Diagnosis not present

## 2019-09-06 DIAGNOSIS — I2510 Atherosclerotic heart disease of native coronary artery without angina pectoris: Secondary | ICD-10-CM | POA: Diagnosis not present

## 2019-09-06 DIAGNOSIS — E1159 Type 2 diabetes mellitus with other circulatory complications: Secondary | ICD-10-CM

## 2019-09-06 NOTE — Patient Instructions (Signed)
Medication Instructions:  No changes If you need a refill on your cardiac medications before your next appointment, please call your pharmacy.   Lab work: None ordered If you have labs (blood work) drawn today and your tests are completely normal, you will receive your results only by: [place] (if you have MyChart) OR A paper copy in the mail If you have any lab test that is abnormal or we need to change your treatment, we will call you to review the results.  Testing/Procedures: None ordered  Follow-Up: At [facility], you and your health needs are our priority.  As part of our continuing mission to provide you with exceptional heart care, we have created designated Provider Care Teams.  These Care Teams include your primary Cardiologist (physician) and Advanced Practice Providers (APPs -  Physician Assistants and Nurse Practitioners) who all work together to provide you with the care you need, when you need it.  We recommend signing up for the patient portal called "MyChart".  Sign up information is provided on this After Visit Summary.  MyChart is used to connect with patients for Virtual Visits (Telemedicine).  Patients are able to view lab/test results, encounter notes, upcoming appointments, etc.  Non-urgent messages can be sent to your provider as well.   To learn more about what you can do with MyChart, go to [email or web address].    Your next appointment:   6 month(s)  The format for your next appointment:   In Person  Provider:   [name], MD   Remote monitoring is used to monitor your pacemaker from home. This monitoring reduces the number of office visits required to check your device to one time per year. It allows [place] to keep an eye on the functioning of your device to ensure it is working properly. You are scheduled for a device check from home on 11/09/19. You may send your transmission at any time that day. If you have a wireless device, the  transmission will be sent automatically.

## 2019-09-06 NOTE — Telephone Encounter (Signed)
Called and [facility] @ 4:08pm asking the patient to RTC regarding the message below.//AB/CMA

## 2019-09-06 NOTE — Telephone Encounter (Signed)
She can discuss pricing with [employer] for out of pocket expense.  She can also order through [employer].  She should cover with vaseline and non stick pad until she is able to obtain supplies.
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
## 2019-09-09 NOTE — Telephone Encounter (Signed)
Called and spoke with the patient on (09/07/19) regarding the message below.  Informed her of Dr. Jodene Nam note, and she stated not to worry about the Xeroform.  She said she has enough, and she will go to [facility] for the other supplies.//AB/CMA

## 2019-09-15 ENCOUNTER — Other Ambulatory Visit: Payer: Self-pay

## 2019-09-15 ENCOUNTER — Ambulatory Visit (INDEPENDENT_AMBULATORY_CARE_PROVIDER_SITE_OTHER): Payer: Medicare HMO | Admitting: Internal Medicine

## 2019-09-15 ENCOUNTER — Encounter: Payer: Self-pay | Admitting: Internal Medicine

## 2019-09-15 VITALS — BP 96/58 | HR 97 | Temp 97.8°F

## 2019-09-15 DIAGNOSIS — T23231D Burn of second degree of multiple right fingers (nail), not including thumb, subsequent encounter: Secondary | ICD-10-CM | POA: Diagnosis not present

## 2019-09-15 DIAGNOSIS — T23201D Burn of second degree of right hand, unspecified site, subsequent encounter: Secondary | ICD-10-CM

## 2019-09-15 DIAGNOSIS — T24212D Burn of second degree of left thigh, subsequent encounter: Secondary | ICD-10-CM | POA: Diagnosis not present
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
## 2019-09-17 ENCOUNTER — Emergency Department (HOSPITAL_COMMUNITY): Payer: Medicare HMO

## 2019-09-17 ENCOUNTER — Emergency Department (HOSPITAL_COMMUNITY)
Admission: EM | Admit: 2019-09-17 | Discharge: 2019-09-18 | Disposition: A | Discharge status: Home / Self Care | Payer: Medicare HMO | Attending: Emergency Medicine | Admitting: Emergency Medicine

## 2019-09-17 ENCOUNTER — Other Ambulatory Visit: Payer: Self-pay

## 2019-09-17 ENCOUNTER — Telehealth: Payer: Self-pay | Admitting: Cardiovascular Disease

## 2019-09-17 DIAGNOSIS — R0602 Shortness of breath: Secondary | ICD-10-CM | POA: Diagnosis not present

## 2019-09-17 DIAGNOSIS — Z951 Presence of aortocoronary bypass graft: Secondary | ICD-10-CM | POA: Insufficient documentation

## 2019-09-17 DIAGNOSIS — Z95810 Presence of automatic (implantable) cardiac defibrillator: Secondary | ICD-10-CM | POA: Diagnosis not present

## 2019-09-17 DIAGNOSIS — M6258 Muscle wasting and atrophy, not elsewhere classified, other site: Secondary | ICD-10-CM | POA: Diagnosis not present

## 2019-09-17 DIAGNOSIS — R600 Localized edema: Secondary | ICD-10-CM | POA: Insufficient documentation

## 2019-09-17 DIAGNOSIS — M25472 Effusion, left ankle: Secondary | ICD-10-CM | POA: Insufficient documentation

## 2019-09-17 DIAGNOSIS — I5042 Chronic combined systolic (congestive) and diastolic (congestive) heart failure: Secondary | ICD-10-CM | POA: Diagnosis not present

## 2019-09-17 DIAGNOSIS — K760 Fatty (change of) liver, not elsewhere classified: Secondary | ICD-10-CM | POA: Insufficient documentation

## 2019-09-17 DIAGNOSIS — Z7901 Long term (current) use of anticoagulants: Secondary | ICD-10-CM | POA: Insufficient documentation

## 2019-09-17 DIAGNOSIS — E119 Type 2 diabetes mellitus without complications: Secondary | ICD-10-CM | POA: Diagnosis not present

## 2019-09-17 DIAGNOSIS — Z7984 Long term (current) use of oral hypoglycemic drugs: Secondary | ICD-10-CM | POA: Insufficient documentation

## 2019-09-17 DIAGNOSIS — Z9049 Acquired absence of other specified parts of digestive tract: Secondary | ICD-10-CM | POA: Insufficient documentation

## 2019-09-17 DIAGNOSIS — I110 Hypertensive heart disease with heart failure: Secondary | ICD-10-CM | POA: Insufficient documentation

## 2019-09-17 DIAGNOSIS — Z87891 Personal history of nicotine dependence: Secondary | ICD-10-CM | POA: Insufficient documentation

## 2019-09-17 DIAGNOSIS — R0789 Other chest pain: Secondary | ICD-10-CM | POA: Insufficient documentation

## 2019-09-17 DIAGNOSIS — M6289 Other specified disorders of muscle: Secondary | ICD-10-CM | POA: Diagnosis not present

## 2019-09-17 DIAGNOSIS — Z90710 Acquired absence of both cervix and uterus: Secondary | ICD-10-CM | POA: Diagnosis not present

## 2019-09-17 DIAGNOSIS — Z79899 Other long term (current) drug therapy: Secondary | ICD-10-CM | POA: Diagnosis not present

## 2019-09-17 DIAGNOSIS — I2510 Atherosclerotic heart disease of native coronary artery without angina pectoris: Secondary | ICD-10-CM | POA: Diagnosis not present

## 2019-09-17 DIAGNOSIS — I700 Atherosclerosis of aorta: Secondary | ICD-10-CM | POA: Diagnosis not present

## 2019-09-17 DIAGNOSIS — Z853 Personal history of malignant neoplasm of breast: Secondary | ICD-10-CM | POA: Insufficient documentation

## 2019-09-17 DIAGNOSIS — R079 Chest pain, unspecified: Secondary | ICD-10-CM | POA: Diagnosis not present

## 2019-09-17 LAB — CBC
HCT: 39.3 % (ref 36.0–46.0)
Hemoglobin: 13.1 g/dL (ref 12.0–15.0)
MCH: 31.4 pg (ref 26.0–34.0)
MCHC: 33.3 g/dL (ref 30.0–36.0)
MCV: 94.2 fL (ref 80.0–100.0)
Platelets: 241 10*3/uL (ref 150–400)
RBC: 4.17 MIL/uL (ref 3.87–5.11)
RDW: 12.8 % (ref 11.5–15.5)
WBC: 6.9 10*3/uL (ref 4.0–10.5)
nRBC: 0 % (ref 0.0–0.2)

## 2019-09-17 LAB — BASIC METABOLIC PANEL
Anion gap: 11 (ref 5–15)
BUN: 17 mg/dL (ref 8–23)
CO2: 24 mmol/L (ref 22–32)
Calcium: 9.5 mg/dL (ref 8.9–10.3)
Chloride: 101 mmol/L (ref 98–111)
Creatinine, Ser: 0.73 mg/dL (ref 0.44–1.00)
GFR calc Af Amer: >60 mL/min (ref >60)
GFR calc non Af Amer: >60 mL/min (ref >60)
Glucose, Bld: 107 mg/dL — ABNORMAL HIGH (ref 70–99)
Potassium: 4.1 mmol/L (ref 3.5–5.1)
Sodium: 136 mmol/L (ref 135–145)

## 2019-09-17 LAB — TROPONIN I (HIGH SENSITIVITY)
Troponin I (High Sensitivity): 6 ng/L (ref <18)
Troponin I (High Sensitivity): 6 ng/L (ref <18)

## 2019-09-17 NOTE — Telephone Encounter (Signed)
New Message  Pt c/o swelling: STAT is pt has developed SOB within 24 hours  How much weight have you gained and in what time span? None 1) If swelling, where is the swelling located? Entire left arm and left leg  2) Are you currently taking a fluid pill? Yes  3) Are you currently SOB? No  4) Do you have a log of your daily weights (if so, list)? 154, 153  5) Have you gained 3 pounds in a day or 5 pounds in a week? no  6) Have you traveled recently? No  Patient will be sending pictures through mychart.

## 2019-09-17 NOTE — ED Triage Notes (Signed)
Pt advised by cardiologist to come here for further eval-woke up this morning with swelling in L arm and L leg but no other complaints. Talked back and forth with her cardiologist and triage nurse on [place] who first advised her to follow up with PCP, but then pt developed mild shortness of breath and central chest pain so was advised to come to ED. Hx MI last month, CABG x 4 in 2014.

## 2019-09-17 NOTE — Telephone Encounter (Signed)
Responded to patient via MyChart message previously sent  Reported unilateral swelling BP and HR stable Mild SOB, but not reported worse No reported acute weight gain Advised PCP or urgent care eval

## 2019-09-18 ENCOUNTER — Emergency Department (HOSPITAL_COMMUNITY): Payer: Medicare HMO

## 2019-09-18 DIAGNOSIS — Z9049 Acquired absence of other specified parts of digestive tract: Secondary | ICD-10-CM | POA: Diagnosis not present

## 2019-09-18 DIAGNOSIS — I700 Atherosclerosis of aorta: Secondary | ICD-10-CM | POA: Diagnosis not present

## 2019-09-18 DIAGNOSIS — K760 Fatty (change of) liver, not elsewhere classified: Secondary | ICD-10-CM | POA: Diagnosis not present

## 2019-09-18 DIAGNOSIS — M6258 Muscle wasting and atrophy, not elsewhere classified, other site: Secondary | ICD-10-CM | POA: Diagnosis not present

## 2019-09-18 DIAGNOSIS — M6289 Other specified disorders of muscle: Secondary | ICD-10-CM | POA: Diagnosis not present

## 2019-09-18 LAB — TROPONIN I (HIGH SENSITIVITY): Troponin I (High Sensitivity): 7 ng/L (ref <18)

## 2019-09-18 MED ORDER — IOHEXOL 300 MG/ML  SOLN
100.0000 mL | Freq: Once | INTRAMUSCULAR | Status: AC | PRN
  Administered 2019-09-18: 100 mL via INTRAVENOUS
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
## 2019-09-18 NOTE — ED Notes (Signed)
Patient verbalizes understanding of discharge instructions. Opportunity for questioning and answers were provided. Armband removed by staff, pt discharged from ED. Pt. ambulatory and discharged home.  

## 2019-09-18 NOTE — ED Provider Notes (Signed)
[facility] EMERGENCY DEPARTMENT Provider Note   CSN: [id number] Arrival date & time: 09/17/19  1733     History Chief Complaint  Patient presents with  . Chest Pain  . Shortness of Breath  . Leg Swelling    [name] is a 69 y.o. female with a history of chronic combined systolic and diastolic CHF, NYHA class 3, CAD, left breast cancer s/p bilateral mastectomy, diabetes mellitus type 2 who presents the emergency department with a chief complaint of chest pain.  The patient reports that she awoke with swelling to her left lower leg and left arm.  No history of similar.  She reports that she is currently being treated for a burn to the skin of her left knee that occurred approximately a month ago.  She has been followed by Dr. Heber Newell with plastic surgery and wound is well-healing.  She also reports that she noticed some mild swelling to her left forearm.  No right upper or lower extremity swelling.  No history of unilateral left-sided swelling.  She does report that she was volunteering at her granddaughter's school 2 days ago, which is more activity than her baseline, but denies any swelling yesterday.  She reports that she had been messaging her cardiologist and MyChart earlier in the day regarding the swelling when she suddenly developed mild shortness of breath followed by pressure-like central chest pain accompanied by mild shortness of breath at approximately 1500.  She reports both symptoms have been simultaneously coming and going since onset.  Reports that she has not had an episode of shortness of breath or chest pain since being roomed in the emergency department.  Reports that she was on the phone when her symptoms began.  No known aggravating or alleviating factors.  No treatment prior to arrival.  Chest pain is nonradiating.  No history of similar.  She does have a history of an MI, but reports that her symptoms today are markedly different from when she had an  MI.  No dizziness, lightheadedness, diaphoresis, PND, orthopnea, cough, abdominal swelling, headache, pallor, palpitations, nausea, vomiting, diarrhea, abdominal pain, back pain, pain in the upper extremities, numbness, weakness, neck pain, fever, chills, or URI symptoms.  No treatment prior to arrival.  No recent changes in her medication.  She does note that her blood pressure has been softer over the last 2 days, 90s over 60s, and reports that she is typically around 130s over 80s.  She has a history of a bilateral mastectomy secondary to left breast cancer.  No history of lymphedema.  She is on Xarelto for atrial fibrillation.  The history is provided by the patient. No language interpreter was used.       Past Medical History:  Diagnosis Date  . Acute on chronic combined systolic and diastolic CHF, NYHA class 3 ([place]) 03/19/2013  . AICD (automatic cardioverter/defibrillator) present 01/2013   Biventricular cardiac pacemaker in situ   . Allergic rhinitis   . Anogenital warts 01/22/2011  . Anxiety state, unspecified 05/13/2013  . BREAST CANCER, HX OF 09/19/2009   Qualifier: Diagnosis of  By: [name], Jory Ee   . CAD (coronary artery disease)    a. 2004: s/p MI in [place]. No PCI->Medical RX;  b. 07/2012 Cath: LM 30-40, LAD 70p, 70/83m, D1 80-90p, OM1 small 90p, OM2 large 80-90p, 55m, 70-80d, RCA 20-30 diff, EF 40%, glob HK.s/p CABG  . Cancer of left breast [facility]) 2002   Patient reports left breast cancer diagnosis in  2002 treated with bilateral mastectomy positive lymph nodes with left axillary dissection followed by chemotherapy of unknown type  . Cataract   . Chronic combined systolic and diastolic CHF, NYHA class 2 ([place]) 01/08/2013  . Diabetes mellitus type II   . Exertional dyspnea 08/05/2014  . Exertional shortness of breath   . Generalized osteoarthrosis, involving multiple sites 08/25/2015  . Hyperlipidemia   . Hypertension   . Incisional hernia, without obstruction or gangrene  05/04/2015  . Ischemic cardiomyopathy    a. 07/2012 Echo: EF 35%, Sev inferoseptal HK, mildly dil LA, Peak PASP 43mmHg.  Marland Kitchen LBBB (left bundle branch block)    a. intermittent - present during rapid afib 07/2012.  Marland Kitchen MYOCARDIAL INFARCTION, HX OF 09/19/2009   Qualifier: Diagnosis of  By: [name], Jory Ee   . Neuromuscular disorder ([place])    Patient reports chronic numbness in the right foot related to previous surgery on the right leg and "nerve damage"  . PAF (paroxysmal atrial fibrillation) ([place])    a. 07/2012: Amio and xarelto initiated.  . Right carotid bruit 08/11/2012  . SBO (small bowel obstruction) ([place])   . SOB (shortness of breath) 02/26/2018  . Type 2 diabetes mellitus with circulatory disorder, without long-term current use of insulin ([place]) 10/03/2015  . Vitamin D deficiency 02/22/2016    Patient Active Problem List   Diagnosis Date Noted  . SOB (shortness of breath) 02/26/2018  . Vitamin D deficiency 02/22/2016  . Long term current use of anticoagulant 11/10/2015  . Type 2 diabetes mellitus with circulatory disorder, without long-term current use of insulin ([place]) 10/03/2015  . Chronic pain disorder 10/03/2015  . Generalized osteoarthrosis, involving multiple sites 08/25/2015  . Other fatigue 08/11/2015  . Back pain, chronic 08/11/2015  . Incisional hernia 05/25/2015  . Incisional hernia, without obstruction or gangrene 05/04/2015  . Exertional dyspnea 08/05/2014  . Routine general medical examination at a health care facility 04/14/2014  . Anxiety state, unspecified 05/13/2013  . Acute on chronic combined systolic and diastolic CHF, NYHA class 3 ([place]) 03/19/2013  . High anion gap metabolic acidosis 79/04/4095  . Biventricular cardiac pacemaker - [employer] Jude, Nov 2014 03/04/2013  . Chronic combined systolic and diastolic CHF, NYHA class 2 ([place]) 01/08/2013  . S/P CABG x 4: 09/28/12 (LIMA-LAD, SVG-OM1-OM2, SVG-Intermediate) 09/29/2012  . Right carotid bruit 08/11/2012  . PAF  (paroxysmal atrial fibrillation) ([place]) 07/21/2012  . Ischemic cardiomyopathy   . CAD (coronary artery disease)   . Anogenital warts 01/22/2011  . Hyperlipidemia 09/19/2009  . Essential hypertension 09/19/2009  . MYOCARDIAL INFARCTION, HX OF 09/19/2009  . Allergic rhinitis 09/19/2009  . BREAST CANCER, HX OF 09/19/2009    Past Surgical History:  Procedure Laterality Date  . ABDOMINAL HYSTERECTOMY  2000  . APPENDECTOMY  1974  . BI-VENTRICULAR PACEMAKER INSERTION N/A 01/25/2013   Procedure: BI-VENTRICULAR PACEMAKER INSERTION (CRT-P);  Surgeon: Evans Lance, MD;  Location: [facility] CATH LAB;  Service: Cardiovascular;  Laterality: N/A;  . BREAST BIOPSY Left 2002  . CARDIAC CATHETERIZATION    . CHOLECYSTECTOMY OPEN  1974  . CORONARY ARTERY BYPASS GRAFT N/A 09/28/2012   Procedure: CORONARY ARTERY BYPASS GRAFTING (CABG);  Surgeon: [name], MD;  Location: [facility];  Service: Open Heart Surgery;  Laterality: N/A;  CABG x four, using left internal mammary artery and left leg greater saphenous vein harvested endoscopically  . DILATION AND CURETTAGE OF UTERUS  1983  . EPICARDIAL PACING LEAD PLACEMENT N/A 09/28/2012   Procedure: EPICARDIAL PACING LEAD  PLACEMENT;  Surgeon: [name], MD;  Location: [facility];  Service: Thoracic;  Laterality: N/A;  LV LEAD PLACEMENT  . HERNIA REPAIR    . INCISIONAL HERNIA REPAIR N/A 05/25/2015   Procedure: LAPAROSCOPIC INCISIONAL HERNIA WITH MESH ;  Surgeon: [name], MD;  Location: [facility];  Service: General;  Laterality: N/A;  . INCONTINENCE SURGERY  2000  . INSERTION OF MESH N/A 05/25/2015   Procedure: INSERTION OF MESH;  Surgeon: [name], MD;  Location: [facility];  Service: General;  Laterality: N/A;  . INTRAOPERATIVE TRANSESOPHAGEAL ECHOCARDIOGRAM N/A 09/28/2012   Procedure: INTRAOPERATIVE TRANSESOPHAGEAL ECHOCARDIOGRAM;  Surgeon: [name], MD;  Location: [facility];  Service: Open Heart Surgery;  Laterality: N/A;  . LAPAROSCOPIC INCISIONAL /  UMBILICAL / VENTRAL HERNIA REPAIR  05/25/2015   IHR  . LEFT HEART CATHETERIZATION WITH CORONARY ANGIOGRAM N/A 07/20/2012   Procedure: LEFT HEART CATHETERIZATION WITH CORONARY ANGIOGRAM;  Surgeon: [name], MD;  Location: [facility] CATH LAB;  Service: Cardiovascular;  Laterality: N/A;  . MASTECTOMY Right 2002  . MASTECTOMY MODIFIED RADICAL W/ AXILLARY LYMPH NODES W/ OR W/O PECTORALIS MINOR Left 2002  . MAZE N/A 09/28/2012   Procedure: MAZE;  Surgeon: [name], MD;  Location: [facility];  Service: Open Heart Surgery;  Laterality: N/A;  . ORIF WRIST FRACTURE Right 11/08/2017   Procedure: OPEN REDUCTION INTERNAL FIXATION (ORIF) WRIST FRACTURE;  Surgeon: [name], MD;  Location: [facility];  Service: Orthopedics;  Laterality: Right;  90 mins  . TENDON REPAIR Right 2001 X 3-4   torn ligaments and tendons in ankle up to knee from work related accident  . TUBAL LIGATION  1987     OB History   No obstetric history on file.     Family History  Problem Relation Age of Onset  . Kidney failure Mother   . Heart disease Mother        Died mid 40X complications of diabetes  . Colon cancer Sister   . Arthritis Other   . Cancer Other        ovarian  . Diabetes Other   . Hyperlipidemia Other   . Liver cancer Brother   . Stomach cancer Neg Hx   . Rectal cancer Neg Hx   . Esophageal cancer Neg Hx     Social History   Tobacco Use  . Smoking status: Former Smoker    Packs/day: 1.00    Years: 10.00    Pack years: 10.00    Types: Cigarettes    Quit date: 03/11/2012    Years since quitting: 7.5  . Smokeless tobacco: Never Used  Vaping Use  . Vaping Use: Never used  Substance Use Topics  . Alcohol use: No  . Drug use: No    Home Medications Prior to Admission medications   Medication Sig Start Date End Date Taking? Authorizing Provider  APPLE CIDER VINEGAR PO Take 1 tablet by mouth in the morning, at noon, and at bedtime.   Yes [provider]  carvedilol (COREG) 12.5 MG  tablet Take 1 tablet (12.5 mg total) by mouth 2 (two) times daily with a meal. 08/12/19  Yes Croitoru, Mihai, MD  Dulaglutide (TRULICITY) 1.5 BD/5.3GD SOPN INJECT 1.5MG  (1 PEN) SUBCUTANEOUSLY EVERY WEEK Patient taking differently: Inject 1.5 mg into the skin every Sunday.  08/04/19  Yes Philemon Kingdom, MD  empagliflozin (JARDIANCE) 25 MG TABS tablet Take 25 mg by mouth daily before breakfast. 04/14/19  Yes Philemon Kingdom, MD  Evolocumab (  REPATHA SURECLICK) 240 MG/ML SOAJ Inject 140 mg into the skin every 14 (fourteen) days. 04/27/19  Yes Croitoru, Mihai, MD  furosemide (LASIX) 20 MG tablet TAKE 1 TABLET(20 MG) BY MOUTH DAILY Patient taking differently: Take 20 mg by mouth daily.  04/15/19  Yes Croitoru, Mihai, MD  glipiZIDE (GLUCOTROL) 5 MG tablet TAKE ONE TABLET BY MOUTH TWICE A DAY BEFORE A MEAL Patient taking differently: Take 5 mg by mouth 2 (two) times daily before a meal.  04/14/19  Yes Philemon Kingdom, MD  isosorbide mononitrate (IMDUR) 60 MG 24 hr tablet Take 1 tablet (60 mg total) by mouth daily. 07/23/19 10/21/19 Yes Croitoru, Mihai, MD  losartan (COZAAR) 25 MG tablet Take 1 tablet (25 mg total) by mouth 2 (two) times daily. 04/15/19  Yes Croitoru, Mihai, MD  metFORMIN (GLUCOPHAGE) 1000 MG tablet TAKE ONE TABLET BY MOUTH TWICE A DAY WITH FOOD Patient taking differently: Take 1,000 mg by mouth 2 (two) times daily with a meal.  04/14/19  Yes Philemon Kingdom, MD  nitroGLYCERIN (NITROSTAT) 0.4 MG SL tablet Place 1 tablet (0.4 mg total) under the tongue every 5 (five) minutes as needed for chest pain. 04/19/19  Yes Croitoru, Mihai, MD  rivaroxaban (XARELTO) 20 MG TABS tablet Take 1 tablet (20 mg total) by mouth daily with supper. 08/12/19  Yes Croitoru, Mihai, MD  Blood Glucose Monitoring Suppl (TRUE METRIX AIR GLUCOSE METER) DEVI Check blood sugar 2 times a day 08/10/19   Philemon Kingdom, MD  glucose blood (TRUE METRIX BLOOD GLUCOSE TEST) test strip Use to check blood sugar 2 times a day. 08/10/19    Philemon Kingdom, MD  TRUEplus Lancets 33G MISC Use to check blood sugar 2 times a day. 08/10/19   Philemon Kingdom, MD    Allergies    Statins  Review of Systems   Review of Systems  Constitutional: Negative for activity change, chills, fever and unexpected weight change.  HENT: Negative for congestion and sore throat.   Respiratory: Positive for shortness of breath. Negative for cough.   Cardiovascular: Positive for chest pain and leg swelling.  Gastrointestinal: Negative for abdominal pain, diarrhea, nausea and vomiting.  Genitourinary: Negative for dysuria.  Musculoskeletal: Negative for back pain, myalgias, neck pain and neck stiffness.  Skin: Negative for rash.  Allergic/Immunologic: Negative for immunocompromised state.  Neurological: Negative for syncope, weakness and headaches.  Psychiatric/Behavioral: Negative for confusion.    Physical Exam Updated Vital Signs BP (!) 147/81   Pulse 84   Temp 98.1 F (36.7 C)   Resp 17   Ht 5\' 4"  (1.626 m)   Wt 69.4 kg   SpO2 94%   BMI 26.26 kg/m   Physical Exam Vitals and nursing note reviewed.  Constitutional:      General: She is not in acute distress.    Appearance: She is not ill-appearing, toxic-appearing or diaphoretic.     Comments: Pleasant, well-appearing.  No acute distress.  HENT:     Head: Normocephalic.  Eyes:     Conjunctiva/sclera: Conjunctivae normal.  Cardiovascular:     Rate and Rhythm: Normal rate and regular rhythm.     Pulses: Normal pulses.     Heart sounds: Normal heart sounds. No murmur heard.  No friction rub. No gallop.      Comments: Peripheral pulses are symmetric and 2+. Pulmonary:     Effort: Pulmonary effort is normal. No respiratory distress.     Breath sounds: No stridor. No wheezing, rhonchi or rales.     Comments:  Lungs are clear to auscultation bilaterally.  No increased work of breathing.  No tenderness to palpation to the chest wall. Chest:     Chest wall: No tenderness.    Abdominal:     General: There is no distension.     Palpations: Abdomen is soft. There is no mass.     Tenderness: There is no abdominal tenderness. There is no right CVA tenderness, left CVA tenderness, guarding or rebound.     Hernia: No hernia is present.  Musculoskeletal:     Cervical back: Neck supple.     Comments: Mild pedal edema noted to the left ankle.  Left calf and lower leg are nonedematous and nontender.  No edema noted to the right ankle.  Normal exam of the left ankle.  There are multiple well-healing wounds noted to the left medial knee with good granuloma tissue formation.  No evidence of cellulitis or abscess.   There is minimal swelling noted to the left distal forearm.  Normal exam of the left wrist and elbow.  No redness, warmth, or tenderness to palpation.  She is neurovascular intact throughout the bilateral upper and lower extremities.  Skin:    General: Skin is warm.     Findings: No rash.  Neurological:     Mental Status: She is alert.  Psychiatric:        Behavior: Behavior normal.     ED Results / Procedures / Treatments   Labs (all labs ordered are listed, but only abnormal results are displayed) Labs Reviewed  BASIC METABOLIC PANEL - Abnormal; Notable for the following components:      Result Value   Glucose, Bld 107 (*)    All other components within normal limits  CBC  TROPONIN I (HIGH SENSITIVITY)  TROPONIN I (HIGH SENSITIVITY)  TROPONIN I (HIGH SENSITIVITY)  TROPONIN I (HIGH SENSITIVITY)    EKG EKG Interpretation  Date/Time:  Friday September 17 2019 17:48:04 EDT Ventricular Rate:  86 PR Interval:  122 QRS Duration: 144 QT Interval:  442 QTC Calculation: 528 R Axis:   -46 Text Interpretation: Atrial-sensed ventricular-paced rhythm Abnormal ECG No significant change was found Confirmed by Ezequiel Essex [phone]) on 09/17/2019 10:55:56 PM   Radiology DG Chest 2 View  Result Date: 09/17/2019 CLINICAL DATA:  Shortness of breath.  Chest  pain. EXAM: CHEST - 2 VIEW COMPARISON:  Jul 21, 2019 FINDINGS: Stable pacemaker. Stable cardiomediastinal silhouette. No pneumothorax. No nodules or masses. No focal infiltrates. No acute abnormalities. IMPRESSION: No active cardiopulmonary disease. Electronically Signed   By: Dorise Bullion III M.D   On: 09/17/2019 18:25   CT Chest W Contrast  Result Date: 09/18/2019 CLINICAL DATA:  Swelling in the left arm and leg EXAM: CT CHEST, ABDOMEN, AND PELVIS WITH CONTRAST TECHNIQUE: Multidetector CT imaging of the chest, abdomen and pelvis was performed following the standard protocol during bolus administration of intravenous contrast. CONTRAST:  175mL OMNIPAQUE IOHEXOL 300 MG/ML  SOLN COMPARISON:  CT abdomen pelvis 07/18/2016, radiograph 09/17/2019, CT angiography chest, abdomen and pelvis 03/14/2013 FINDINGS: CT CHEST FINDINGS Cardiovascular: Pacer pack overlies the left chest wall. A single lead courses trans mediastinal E to terminate near the left ventricular free wall. Additional leads coursing via the subclavian vein terminate at the right atrium and right ventricle. Mild cardiomegaly with biatrial enlargement. Postsurgical changes from prior CABG with extensive calcification of the native coronary arteries and prior stenting as well. No pericardial effusion. The aortic root is suboptimally assessed given cardiac pulsation artifact. Atherosclerotic  plaque within the normal caliber aorta. No acute luminal abnormality. No periaortic stranding or hemorrhage. Normal 3 vessel branching of the aortic arch with plaque in the proximal great vessels including more moderate narrowing at the left vertebral artery origin incompletely assessed on this non angiographic technique. Central pulmonary arteries are normal caliber. No large central filling defects on this non tailored examination of the pulmonary arteries. Mediastinum/Nodes: Postsurgical changes in the anterior mediastinum related to prior sternotomy and CABG. No  mediastinal fluid or gas. Few partially calcified subcentimeter thyroid nodules are not significantly changed since 2015 and are likely benign. No acute abnormality of the trachea or esophagus. No worrisome mediastinal, hilar or axillary adenopathy. Lungs/Pleura: Minimal atelectatic changes in the lung bases. No consolidation, features of edema, pneumothorax, or effusion. No concerning pulmonary nodules or masses. Musculoskeletal: Bilateral breast prostheses. Prior sternotomy with near complete bony fusion across the sternal plate. Sternal sutures appear intact. Slightly asymmetric stranding along the medial left breast is likely postsurgical and unchanged since 2015. CT ABDOMEN PELVIS FINDINGS Hepatobiliary: Diffuse hepatic hypoattenuation compatible with hepatic steatosis. No focal liver abnormality is seen. Patient is post cholecystectomy. Slight prominence of the biliary tree likely related to reservoir effect. No calcified intraductal gallstones. Pancreas: Punctate calcification at the tail the pancreas may reflect sequela of prior inflammation. No pancreatic ductal dilatation or surrounding inflammatory changes. Spleen: Normal in size without focal abnormality. Adrenals/Urinary Tract: Normal adrenal glands. Kidneys enhance and excrete symmetrically. Slightly lobular contour of the kidneys could reflect areas of cortical scarring with stable mild bilateral perinephric stranding similar to comparison studies. No concerning renal lesion. No urolithiasis or hydronephrosis. Stomach/Bowel: Distal esophagus, stomach and duodenal sweep are unremarkable. No small bowel wall thickening or dilatation. No evidence of obstruction. The appendix is surgically absent. No colonic dilatation or wall thickening. Vascular/Lymphatic: Atherosclerotic calcifications within the abdominal aorta and branch vessels. No aneurysm or ectasia. Plaque at the splanchnic and renal artery ostia likely results in some mild to moderate multilevel  ostial narrowing, incompletely assessed on this non angiographic technique. No enlarged abdominopelvic lymph nodes. Reproductive: Uterus is surgically absent. No concerning adnexal lesions. Other: Marked anterior abdominal wall laxity and evidence of prior hernia repairs. Superimposed ventral fat containing hernia is similar to prior. No abdominopelvic free air or fluid. Additional fat containing inguinal hernias noted bilaterally. Musculoskeletal: No acute osseous abnormality or suspicious osseous lesion. Degenerative changes throughout the spine hips and pelvis. Near complete muscular atrophy of the rectus sheath IMPRESSION: 1. No acute CT findings of the chest, abdomen or pelvis to explain the patient's symptoms. 2. Postsurgical changes from prior CABG with extensive calcification of the native coronary arteries and prior stenting as well. 3. Hepatic steatosis. 4. Marked anterior abdominal wall laxity and evidence of prior hernia repairs. Superimposed ventral fat containing hernia is similar to prior. 5. Punctate calcification in the tail of the pancreas could reflect sequela of prior or chronic pancreatitis, no acute pancreatic inflammation. 6. Prior hysterectomy, cholecystectomy, appendectomy. 7. Aortic Atherosclerosis (ICD10-I70.0). Electronically Signed   By: Lovena Le M.D.   On: 09/18/2019 01:37   CT ABDOMEN PELVIS W CONTRAST  Result Date: 09/18/2019 CLINICAL DATA:  Swelling in the left arm and leg EXAM: CT CHEST, ABDOMEN, AND PELVIS WITH CONTRAST TECHNIQUE: Multidetector CT imaging of the chest, abdomen and pelvis was performed following the standard protocol during bolus administration of intravenous contrast. CONTRAST:  166mL OMNIPAQUE IOHEXOL 300 MG/ML  SOLN COMPARISON:  CT abdomen pelvis 07/18/2016, radiograph 09/17/2019, CT angiography chest, abdomen and  pelvis 03/14/2013 FINDINGS: CT CHEST FINDINGS Cardiovascular: Pacer pack overlies the left chest wall. A single lead courses trans mediastinal  E to terminate near the left ventricular free wall. Additional leads coursing via the subclavian vein terminate at the right atrium and right ventricle. Mild cardiomegaly with biatrial enlargement. Postsurgical changes from prior CABG with extensive calcification of the native coronary arteries and prior stenting as well. No pericardial effusion. The aortic root is suboptimally assessed given cardiac pulsation artifact. Atherosclerotic plaque within the normal caliber aorta. No acute luminal abnormality. No periaortic stranding or hemorrhage. Normal 3 vessel branching of the aortic arch with plaque in the proximal great vessels including more moderate narrowing at the left vertebral artery origin incompletely assessed on this non angiographic technique. Central pulmonary arteries are normal caliber. No large central filling defects on this non tailored examination of the pulmonary arteries. Mediastinum/Nodes: Postsurgical changes in the anterior mediastinum related to prior sternotomy and CABG. No mediastinal fluid or gas. Few partially calcified subcentimeter thyroid nodules are not significantly changed since 2015 and are likely benign. No acute abnormality of the trachea or esophagus. No worrisome mediastinal, hilar or axillary adenopathy. Lungs/Pleura: Minimal atelectatic changes in the lung bases. No consolidation, features of edema, pneumothorax, or effusion. No concerning pulmonary nodules or masses. Musculoskeletal: Bilateral breast prostheses. Prior sternotomy with near complete bony fusion across the sternal plate. Sternal sutures appear intact. Slightly asymmetric stranding along the medial left breast is likely postsurgical and unchanged since 2015. CT ABDOMEN PELVIS FINDINGS Hepatobiliary: Diffuse hepatic hypoattenuation compatible with hepatic steatosis. No focal liver abnormality is seen. Patient is post cholecystectomy. Slight prominence of the biliary tree likely related to reservoir effect. No  calcified intraductal gallstones. Pancreas: Punctate calcification at the tail the pancreas may reflect sequela of prior inflammation. No pancreatic ductal dilatation or surrounding inflammatory changes. Spleen: Normal in size without focal abnormality. Adrenals/Urinary Tract: Normal adrenal glands. Kidneys enhance and excrete symmetrically. Slightly lobular contour of the kidneys could reflect areas of cortical scarring with stable mild bilateral perinephric stranding similar to comparison studies. No concerning renal lesion. No urolithiasis or hydronephrosis. Stomach/Bowel: Distal esophagus, stomach and duodenal sweep are unremarkable. No small bowel wall thickening or dilatation. No evidence of obstruction. The appendix is surgically absent. No colonic dilatation or wall thickening. Vascular/Lymphatic: Atherosclerotic calcifications within the abdominal aorta and branch vessels. No aneurysm or ectasia. Plaque at the splanchnic and renal artery ostia likely results in some mild to moderate multilevel ostial narrowing, incompletely assessed on this non angiographic technique. No enlarged abdominopelvic lymph nodes. Reproductive: Uterus is surgically absent. No concerning adnexal lesions. Other: Marked anterior abdominal wall laxity and evidence of prior hernia repairs. Superimposed ventral fat containing hernia is similar to prior. No abdominopelvic free air or fluid. Additional fat containing inguinal hernias noted bilaterally. Musculoskeletal: No acute osseous abnormality or suspicious osseous lesion. Degenerative changes throughout the spine hips and pelvis. Near complete muscular atrophy of the rectus sheath IMPRESSION: 1. No acute CT findings of the chest, abdomen or pelvis to explain the patient's symptoms. 2. Postsurgical changes from prior CABG with extensive calcification of the native coronary arteries and prior stenting as well. 3. Hepatic steatosis. 4. Marked anterior abdominal wall laxity and evidence  of prior hernia repairs. Superimposed ventral fat containing hernia is similar to prior. 5. Punctate calcification in the tail of the pancreas could reflect sequela of prior or chronic pancreatitis, no acute pancreatic inflammation. 6. Prior hysterectomy, cholecystectomy, appendectomy. 7. Aortic Atherosclerosis (ICD10-I70.0). Electronically Signed   By:  Lovena Le M.D.   On: 09/18/2019 01:37    Procedures Procedures (including critical care time)  Medications Ordered in ED Medications  iohexol (OMNIPAQUE) 300 MG/ML solution 100 mL (100 mLs Intravenous Contrast Given 09/18/19 0102)    ED Course  I have reviewed the triage vital signs and the nursing notes.  Pertinent labs & imaging results that were available during my care of the patient were reviewed by me and considered in my medical decision making (see chart for details).    MDM Rules/Calculators/A&P                          69 year old female with a history of chronic combined systolic and diastolic CHF, NYHA class 3, CAD, left breast cancer s/p bilateral mastectomy, diabetes mellitus type 2 who presents to the emergency department with a sudden onset left arm and leg swelling, onset today.  As she was not messaging her cardiologist when she suddenly began having chest pressure and shortness of breath that has been intermittent since onset this afternoon.  She is currently asymptomatic with chest pain or shortness of breath.  Bilateral lower extremity edema is not symmetric.  There is mild edema noted to the left ankle.  Normal exam of the left ankle.  There are some well-healing burns noted to the left knee.  Mild edema could be secondary to this as patient has noted that she has been more active this week.  No calf tenderness and patient is currently anticoagulated on Xarelto so lower suspicion for PE or DVT.  Doubt acute on chronic congestive heart failure.  Regarding her left upper arm swelling, this is localized to the distal forearm  and is minimal.  Doubt injury to the left ankle or elbow.  Low suspicion for upper extremity DVT.  Does not appear consistent with lymphedema.  The patient was seen and evaluated by Dr. Wyvonnia Dusky, attending physician, who recommends CT of the chest abdomen pelvis given the patient's history of breast cancer.  Imaging is negative for acute findings.  No evidence of mass or thoracic outlet obstruction.  Since my initial evaluation, patient has remained chest pain-free and has had no further episodes of shortness of breath.  Vital signs are stable.  Chest pain is very atypical and less likely concerning for ACS, especially in the setting of negative troponin x3 and no changes noted on EKG.  She reports that symptoms are very different from her previous MI.  She is established with cardiology, and I have recommended that she follow-up with them as directed.  She can follow-up with primary care regarding swelling.  Advised her to elevate the left lower extremity to see if this will help with pedal edema.  Labs are otherwise unremarkable.  All questions answered.  Discharge plan has been discussed with patient and she is agreeable.  She is hemodynamically stable and in no acute distress.  ER return precautions given.  Safe for discharge home with outpatient follow-up as indicated.  Final Clinical Impression(s) / ED Diagnoses Final diagnoses:  Atypical chest pain  Edema of left ankle    Rx / DC Orders ED Discharge Orders    None       [name], PA-C 09/18/19 0219    Ezequiel Essex, MD 09/19/19 1622

## 2019-09-21 ENCOUNTER — Encounter: Payer: Self-pay | Admitting: Internal Medicine

## 2019-09-21 ENCOUNTER — Ambulatory Visit (INDEPENDENT_AMBULATORY_CARE_PROVIDER_SITE_OTHER): Payer: Medicare HMO | Admitting: Internal Medicine

## 2019-09-21 ENCOUNTER — Other Ambulatory Visit: Payer: Self-pay

## 2019-09-21 VITALS — BP 110/80 | HR 85 | Temp 98.3°F | Wt 153.1 lb

## 2019-09-21 DIAGNOSIS — Z09 Encounter for follow-up examination after completed treatment for conditions other than malignant neoplasm: Secondary | ICD-10-CM

## 2019-09-21 DIAGNOSIS — R600 Localized edema: Secondary | ICD-10-CM

## 2019-09-21 DIAGNOSIS — E1159 Type 2 diabetes mellitus with other circulatory complications: Secondary | ICD-10-CM

## 2019-09-21 LAB — POCT GLYCOSYLATED HEMOGLOBIN (HGB A1C): Hemoglobin A1C: 6.5 % — AB (ref 4.0–5.6)

## 2019-09-21 NOTE — Patient Instructions (Signed)
-  Nice seeing you today!!  -Let's get you fitted with a left arm compression sleeve.  -Return if no improvement.  -Schedule 3 months follow up.

## 2019-09-21 NOTE — Progress Notes (Signed)
Established Patient Office Visit     This visit occurred during the SARS-CoV-2 public health emergency.  Safety protocols were in place, including screening questions prior to the visit, additional usage of staff PPE, and extensive cleaning of exam room while observing appropriate contact time as indicated for disinfecting solutions.    CC/Reason for Visit: ED follow-up  HPI: [name] is a 69 y.o. female who is coming in today for the above mentioned reasons. Past Medical History is significant for: Coronary artery disease status post CABG with a biventricular pacer with recovered EF, atrial fibrillation anticoagulated on Xarelto, diabetes, hyperlipidemia.  About 5 weeks ago, while cooking in her kitchen, she had a severe burn to her left knee.  She is undergoing local wound care by plastic surgery, has not required a skin graft.  This weekend she noticed swelling of her left distal arm and left leg.  After chatting with her cardiologist she was advised to follow-up with me as they did not believe it was cardiac related.  Unfortunately before that appointment could happen, she developed chest pain and shortness of breath and decided to go to the hospital for evaluation.  Evaluation in the ED including EKG, troponins, chest x-ray and CT of the chest were all normal.  She comes in today for evaluation.  Her history is also significant for remote history of left breast cancer with left axillary node dissection.   Past Medical/Surgical History: Past Medical History:  Diagnosis Date  . Acute on chronic combined systolic and diastolic CHF, NYHA class 3 ([place]) 03/19/2013  . AICD (automatic cardioverter/defibrillator) present 01/2013   Biventricular cardiac pacemaker in situ   . Allergic rhinitis   . Anogenital warts 01/22/2011  . Anxiety state, unspecified 05/13/2013  . BREAST CANCER, HX OF 09/19/2009   Qualifier: Diagnosis of  By: [name], Jory Ee   . CAD (coronary artery disease)    a.  2004: s/p MI in [place]. No PCI->Medical RX;  b. 07/2012 Cath: LM 30-40, LAD 70p, 70/86m, D1 80-90p, OM1 small 90p, OM2 large 80-90p, 39m, 70-80d, RCA 20-30 diff, EF 40%, glob HK.s/p CABG  . Cancer of left breast [facility]) 2002   Patient reports left breast cancer diagnosis in 2002 treated with bilateral mastectomy positive lymph nodes with left axillary dissection followed by chemotherapy of unknown type  . Cataract   . Chronic combined systolic and diastolic CHF, NYHA class 2 ([place]) 01/08/2013  . Diabetes mellitus type II   . Exertional dyspnea 08/05/2014  . Exertional shortness of breath   . Generalized osteoarthrosis, involving multiple sites 08/25/2015  . Hyperlipidemia   . Hypertension   . Incisional hernia, without obstruction or gangrene 05/04/2015  . Ischemic cardiomyopathy    a. 07/2012 Echo: EF 35%, Sev inferoseptal HK, mildly dil LA, Peak PASP 68mmHg.  Marland Kitchen LBBB (left bundle branch block)    a. intermittent - present during rapid afib 07/2012.  Marland Kitchen MYOCARDIAL INFARCTION, HX OF 09/19/2009   Qualifier: Diagnosis of  By: [name], Jory Ee   . Neuromuscular disorder ([place])    Patient reports chronic numbness in the right foot related to previous surgery on the right leg and "nerve damage"  . PAF (paroxysmal atrial fibrillation) ([place])    a. 07/2012: Amio and xarelto initiated.  . Right carotid bruit 08/11/2012  . SBO (small bowel obstruction) ([place])   . SOB (shortness of breath) 02/26/2018  . Type 2 diabetes mellitus with circulatory disorder, without long-term current use of insulin (  [place]) 10/03/2015  . Vitamin D deficiency 02/22/2016    Past Surgical History:  Procedure Laterality Date  . ABDOMINAL HYSTERECTOMY  2000  . APPENDECTOMY  1974  . BI-VENTRICULAR PACEMAKER INSERTION N/A 01/25/2013   Procedure: BI-VENTRICULAR PACEMAKER INSERTION (CRT-P);  Surgeon: Evans Lance, MD;  Location: [facility] CATH LAB;  Service: Cardiovascular;  Laterality: N/A;  . BREAST BIOPSY Left 2002  . CARDIAC  CATHETERIZATION    . CHOLECYSTECTOMY OPEN  1974  . CORONARY ARTERY BYPASS GRAFT N/A 09/28/2012   Procedure: CORONARY ARTERY BYPASS GRAFTING (CABG);  Surgeon: [name], MD;  Location: [facility];  Service: Open Heart Surgery;  Laterality: N/A;  CABG x four, using left internal mammary artery and left leg greater saphenous vein harvested endoscopically  . DILATION AND CURETTAGE OF UTERUS  1983  . EPICARDIAL PACING LEAD PLACEMENT N/A 09/28/2012   Procedure: EPICARDIAL PACING LEAD PLACEMENT;  Surgeon: [name], MD;  Location: [place];  Service: Thoracic;  Laterality: N/A;  LV LEAD PLACEMENT  . HERNIA REPAIR    . INCISIONAL HERNIA REPAIR N/A 05/25/2015   Procedure: LAPAROSCOPIC INCISIONAL HERNIA WITH MESH ;  Surgeon: [name], MD;  Location: [facility];  Service: General;  Laterality: N/A;  . INCONTINENCE SURGERY  2000  . INSERTION OF MESH N/A 05/25/2015   Procedure: INSERTION OF MESH;  Surgeon: [name], MD;  Location: [place];  Service: General;  Laterality: N/A;  . INTRAOPERATIVE TRANSESOPHAGEAL ECHOCARDIOGRAM N/A 09/28/2012   Procedure: INTRAOPERATIVE TRANSESOPHAGEAL ECHOCARDIOGRAM;  Surgeon: [name], MD;  Location: [place];  Service: Open Heart Surgery;  Laterality: N/A;  . LAPAROSCOPIC INCISIONAL / UMBILICAL / VENTRAL HERNIA REPAIR  05/25/2015   IHR  . LEFT HEART CATHETERIZATION WITH CORONARY ANGIOGRAM N/A 07/20/2012   Procedure: LEFT HEART CATHETERIZATION WITH CORONARY ANGIOGRAM;  Surgeon: [name], MD;  Location: [facility] C[facility] LAB;  Service: Cardiovascular;  Laterality: N/A;  . MASTECTOMY Right 2002  . MASTECTOMY MODIFIED RADICAL W/ AXILLARY LYMPH NODES W/ OR W/O PECTORALIS MINOR Left 2002  . MAZE N/A 09/28/2012   Procedure: MAZE;  Surgeon: [name], MD;  Location: [place];  Service: Open Heart Surgery;  Laterality: N/A;  . ORIF WRIST FRACTURE Right 11/08/2017   Procedure: OPEN REDUCTION INTERNAL FIXATION (ORIF) WRIST FRACTURE;  Surgeon: [name], MD;   Location: [place];  Service: Orthopedics;  Laterality: Right;  90 mins  . TENDON REPAIR Right 2001 X 3-4   torn ligaments and tendons in ankle up to knee from work related accident  . TUBAL LIGATION  1987    Social History:  reports that she quit smoking about 7 years ago. Her smoking use included cigarettes. She has a 10.00 pack-year smoking history. She has never used smokeless tobacco. She reports that she does not drink alcohol and does not use drugs.  Allergies: Allergies  Allergen Reactions  . Statins Other (See Comments)    Myalgias with rosuvastatin, atorvastatin and simvastatin     Family History:  Family History  Problem Relation Age of Onset  . Kidney failure Mother   . Heart disease Mother        Died mid 67E complications of diabetes  . Colon cancer Sister   . Arthritis Other   . Cancer Other        ovarian  . Diabetes Other   . Hyperlipidemia Other   . Liver cancer Brother   . Stomach cancer Neg Hx   . Rectal cancer Neg Hx   .  Esophageal cancer Neg Hx      Current Outpatient Medications:  .  APPLE CIDER VINEGAR PO, Take 1 tablet by mouth in the morning, at noon, and at bedtime., Disp: , Rfl:  .  Blood Glucose Monitoring Suppl (TRUE METRIX AIR GLUCOSE METER) DEVI, Check blood sugar 2 times a day, Disp: 1 each, Rfl: 0 .  carvedilol (COREG) 12.5 MG tablet, Take 1 tablet (12.5 mg total) by mouth 2 (two) times daily with a meal., Disp: 180 tablet, Rfl: 3 .  Dulaglutide (TRULICITY) 1.5 BM/8.4XL SOPN, INJECT 1.5MG  (1 PEN) SUBCUTANEOUSLY EVERY WEEK (Patient taking differently: Inject 1.5 mg into the skin every Sunday. ), Disp: 12 pen, Rfl: 0 .  empagliflozin (JARDIANCE) 25 MG TABS tablet, Take 25 mg by mouth daily before breakfast., Disp: 90 tablet, Rfl: 3 .  Evolocumab (REPATHA SURECLICK) 244 MG/ML SOAJ, Inject 140 mg into the skin every 14 (fourteen) days., Disp: 2 pen, Rfl: 11 .  furosemide (LASIX) 20 MG tablet, TAKE 1 TABLET(20 MG) BY MOUTH DAILY (Patient taking  differently: Take 20 mg by mouth daily. ), Disp: 90 tablet, Rfl: 3 .  glipiZIDE (GLUCOTROL) 5 MG tablet, TAKE ONE TABLET BY MOUTH TWICE A DAY BEFORE A MEAL (Patient taking differently: Take 5 mg by mouth 2 (two) times daily before a meal. ), Disp: 200 tablet, Rfl: 4 .  glucose blood (TRUE METRIX BLOOD GLUCOSE TEST) test strip, Use to check blood sugar 2 times a day., Disp: 200 each, Rfl: 12 .  isosorbide mononitrate (IMDUR) 60 MG 24 hr tablet, Take 1 tablet (60 mg total) by mouth daily., Disp: 90 tablet, Rfl: 1 .  losartan (COZAAR) 25 MG tablet, Take 1 tablet (25 mg total) by mouth 2 (two) times daily., Disp: 180 tablet, Rfl: 3 .  metFORMIN (GLUCOPHAGE) 1000 MG tablet, TAKE ONE TABLET BY MOUTH TWICE A DAY WITH FOOD (Patient taking differently: Take 1,000 mg by mouth 2 (two) times daily with a meal. ), Disp: 200 tablet, Rfl: 4 .  nitroGLYCERIN (NITROSTAT) 0.4 MG SL tablet, Place 1 tablet (0.4 mg total) under the tongue every 5 (five) minutes as needed for chest pain., Disp: 25 tablet, Rfl: 3 .  rivaroxaban (XARELTO) 20 MG TABS tablet, Take 1 tablet (20 mg total) by mouth daily with supper., Disp: 90 tablet, Rfl: 3 .  TRUEplus Lancets 33G MISC, Use to check blood sugar 2 times a day., Disp: 200 each, Rfl: 12  Review of Systems:  Constitutional: Denies fever, chills, diaphoresis, appetite change and fatigue.  HEENT: Denies photophobia, eye pain, redness, hearing loss, ear pain, congestion, sore throat, rhinorrhea, sneezing, mouth sores, trouble swallowing, neck pain, neck stiffness and tinnitus.   Respiratory: Denies SOB, DOE, cough, chest tightness,  and wheezing.   Cardiovascular: Denies chest pain, palpitations. Gastrointestinal: Denies nausea, vomiting, abdominal pain, diarrhea, constipation, blood in stool and abdominal distention.  Genitourinary: Denies dysuria, urgency, frequency, hematuria, flank pain and difficulty urinating.  Endocrine: Denies: hot or cold intolerance, sweats, changes in hair  or nails, polyuria, polydipsia. Musculoskeletal: Denies myalgias, back pain, joint swelling, arthralgias and gait problem.  Skin: Denies pallor, rash and wound.  Neurological: Denies dizziness, seizures, syncope, weakness, light-headedness, numbness and headaches.  Hematological: Denies adenopathy. Easy bruising, personal or family bleeding history  Psychiatric/Behavioral: Denies suicidal ideation, mood changes, confusion, nervousness, sleep disturbance and agitation    Physical Exam: Vitals:   09/21/19 0703  BP: 110/80  Pulse: 85  Temp: 98.3 F (36.8 C)  TempSrc: Oral  SpO2: 97%  Weight: 153 lb 1.6 oz (69.4 kg)    Body mass index is 26.28 kg/m.   Constitutional: NAD, calm, comfortable Eyes: PERRL, lids and conjunctivae normal. Respiratory: clear to auscultation bilaterally, no wheezing, no crackles. Normal respiratory effort. No accessory muscle use.  Cardiovascular: iRregular rhythm, rate controlled, no murmurs / rubs / gallops.2+ pedal pulses.  Trace nonpitting edema to her left ankle and left distal forearm around the wrist area, full range of motion and no pain to palpation. Neurologic: Grossly intact and nonfocal Psychiatric: Normal judgment and insight. Alert and oriented x 3. Normal mood.    Impression and Plan:  Hospital discharge follow-up  Type 2 diabetes mellitus with other circulatory complication, without long-term current use of insulin ([place]) - Plan: POCT glycosylated hemoglobin (Hb A1C) -Well-controlled with an A1c of 6.5 today.  Leg edema, left Edema of left upper arm -Etiology not entirely clear, do not believe due to her congestive heart failure given it is unilateral.  Edema of the left leg is not very significant, she does have some edema to her distal forearm and wrist, no pain no trauma or injury that she can recall. -I wonder if her left arm edema may be due to delayed lymphedema from her axillary lymph node dissection due to her breast cancer.  Have  advised compression sleeve and lymphatic massage. -She will follow-up with me if continued issues.   Patient Instructions  -Nice seeing you today!!  -Let's get you fitted with a left arm compression sleeve.  -Return if no improvement.  -Schedule 3 months follow up.     Lelon Frohlich, MD [facility]

## 2019-09-24 ENCOUNTER — Ambulatory Visit: Payer: Medicare HMO | Admitting: Cardiovascular Disease

## 2019-09-29 ENCOUNTER — Other Ambulatory Visit: Payer: Self-pay

## 2019-09-29 ENCOUNTER — Encounter: Payer: Self-pay | Admitting: Internal Medicine

## 2019-09-29 ENCOUNTER — Ambulatory Visit (INDEPENDENT_AMBULATORY_CARE_PROVIDER_SITE_OTHER): Payer: Medicare HMO | Admitting: Internal Medicine

## 2019-09-29 VITALS — BP 93/59 | HR 97 | Temp 98.6°F

## 2019-09-29 DIAGNOSIS — T24212D Burn of second degree of left thigh, subsequent encounter: Secondary | ICD-10-CM

## 2019-09-29 DIAGNOSIS — T23201D Burn of second degree of right hand, unspecified site, subsequent encounter: Secondary | ICD-10-CM

## 2019-09-29 DIAGNOSIS — T23231D Burn of second degree of multiple right fingers (nail), not including thumb, subsequent encounter: Secondary | ICD-10-CM | POA: Diagnosis not present

## 2019-09-29 NOTE — Progress Notes (Signed)
Subjective:     Patient ID: [name], female    DOB: 1950/09/25, 69 y.o.   MRN: [id number]  Chief Complaint  Patient presents with  . Follow-up for burn    HPI: The patient is a 69 y.o. female here for follow-up of left leg and right hand burn  Patient stopped using xeroform one week ago on the left leg.  She started using mederma and continues to use vaseline to the leg and right hand burn.  She reports improvement to the burn sites.  She denies pain, drainage or increased warmth or erythema to the wound sites.  Review of Systems  All other systems reviewed and are negative.    has a past medical history of Acute on chronic combined systolic and diastolic CHF, NYHA class 3 ([place]) (03/19/2013), AICD (automatic cardioverter/defibrillator) present (01/2013), Allergic rhinitis, Anogenital warts (01/22/2011), Anxiety state, unspecified (05/13/2013), BREAST CANCER, HX OF (09/19/2009), CAD (coronary artery disease), Cancer of left breast ([place]) (2002), Cataract, Chronic combined systolic and diastolic CHF, NYHA class 2 ([place]) (01/08/2013), Diabetes mellitus type II, Exertional dyspnea (08/05/2014), Exertional shortness of breath, Generalized osteoarthrosis, involving multiple sites (08/25/2015), Hyperlipidemia, Hypertension, Incisional hernia, without obstruction or gangrene (05/04/2015), Ischemic cardiomyopathy, LBBB (left bundle branch block), MYOCARDIAL INFARCTION, HX OF (09/19/2009), Neuromuscular disorder ([place]), PAF (paroxysmal atrial fibrillation) ([place]), Right carotid bruit (08/11/2012), SBO (small bowel obstruction) ([facility]), SOB (shortness of breath) (02/26/2018), Type 2 diabetes mellitus with circulatory disorder, without long-term current use of insulin ([employer]) (10/03/2015), and Vitamin D deficiency (02/22/2016).  has a past surgical history that includes Incontinence surgery (2000); Tendon repair (Right, 2001 X 3-4); Appendectomy (1974); Coronary artery bypass graft (N/A, 09/28/2012); MAZE (N/A,  09/28/2012); Intraoprative transesophageal echocardiogram (N/A, 09/28/2012); Epicardial pacing lead placement (N/A, 09/28/2012); left heart catheterization with coronary angiogram (N/A, 07/20/2012); bi-ventricular pacemaker insertion (N/A, 01/25/2013); Laparoscopic incisional / umbilical / ventral hernia repair (05/25/2015); Mastectomy (Right, 2002); Mastectomy modified radical w/ axillary lymph nodes w/ or w/o pectoralis minor (Left, 2002); Cholecystectomy open (1974); Hernia repair; Cardiac catheterization; Abdominal hysterectomy (2000); Dilation and curettage of uterus (1983); Tubal ligation (1987); Breast biopsy (Left, 2002); Incisional hernia repair (N/A, 05/25/2015); Insertion of mesh (N/A, 05/25/2015); and ORIF wrist fracture (Right, 11/08/2017).  reports that she quit smoking about 7 years ago. Her smoking use included cigarettes. She has a 10.00 pack-year smoking history. She has never used smokeless tobacco. Objective:   Vital Signs BP (!) 93/59 (BP Location: Right Arm, Patient Position: Sitting, Cuff Size: Normal)   Pulse 97   Temp 98.6 F (37 C) (Oral)   SpO2 97%  Vital Signs and Nursing Note Reviewed Physical Exam Skin:    Comments: Left leg wound:  Scattered burn sites throughout upper left leg.  Wound is closed with epithelialized tissue throughout.    Right hand wound with epithelialized tissue           Assessment/Plan:     ICD-10-CM   1. Partial thickness burn of left thigh, subsequent encounter  T24.212D   2. Partial thickness burn of right hand including fingers, subsequent encounter  T23.201D    T23.231D    Assessment: Partial thickness burn of left lower extremity and right hand  The burn sites on the left leg and right hand have completely healed and are closed with epithelialized tissue present.  She is using Mederma to help with scarring.  I recommended she continue to moisturize with Vaseline.  At this time she can follow-up as needed.  Plan -[place]  as needed -  Follow-up as needed  Boyd Kerbs, DO 09/29/2019, 2:20 PM

## 2019-10-06 ENCOUNTER — Ambulatory Visit: Payer: Medicare HMO | Admitting: Internal Medicine

## 2019-10-13 ENCOUNTER — Other Ambulatory Visit: Payer: Self-pay | Admitting: Internal Medicine

## 2019-11-09 ENCOUNTER — Ambulatory Visit (INDEPENDENT_AMBULATORY_CARE_PROVIDER_SITE_OTHER): Payer: Medicare HMO | Admitting: *Deleted

## 2019-11-09 DIAGNOSIS — I480 Paroxysmal atrial fibrillation: Secondary | ICD-10-CM

## 2019-11-09 LAB — CUP PACEART REMOTE DEVICE CHECK
Battery Remaining Longevity: 14 mo
Battery Remaining Percentage: 21 %
Battery Voltage: 2.78 V
Brady Statistic AP VP Percent: 1 %
Brady Statistic AP VS Percent: 1 %
Brady Statistic AS VP Percent: 99 %
Brady Statistic AS VS Percent: 1 %
Brady Statistic RA Percent Paced: 1 %
Date Time Interrogation Session: 20210831131639
Implantable Lead Implant Date: 20140721
Implantable Lead Implant Date: 20141117
Implantable Lead Implant Date: 20141117
Implantable Lead Location: 753858
Implantable Lead Location: 753859
Implantable Lead Location: 753860
Implantable Lead Model: 5071
Implantable Pulse Generator Implant Date: 20141117
Lead Channel Impedance Value: 400 Ohm
Lead Channel Impedance Value: 400 Ohm
Lead Channel Impedance Value: 560 Ohm
Lead Channel Pacing Threshold Amplitude: 0.5 V
Lead Channel Pacing Threshold Amplitude: 0.75 V
Lead Channel Pacing Threshold Amplitude: 1.5 V
Lead Channel Pacing Threshold Pulse Width: 0.4 ms
Lead Channel Pacing Threshold Pulse Width: 0.4 ms
Lead Channel Pacing Threshold Pulse Width: 0.7 ms
Lead Channel Sensing Intrinsic Amplitude: 0.7 mV
Lead Channel Sensing Intrinsic Amplitude: 11.5 mV
Lead Channel Setting Pacing Amplitude: 2 V
Lead Channel Setting Pacing Amplitude: 2.5 V
Lead Channel Setting Pacing Amplitude: 2.5 V
Lead Channel Setting Pacing Pulse Width: 0.4 ms
Lead Channel Setting Pacing Pulse Width: 0.7 ms
Lead Channel Setting Sensing Sensitivity: 2 mV
Pulse Gen Model: 3222
Pulse Gen Serial Number: 2981305

## 2019-11-10 NOTE — Progress Notes (Signed)
Remote pacemaker transmission.   

## 2019-12-01 ENCOUNTER — Telehealth: Payer: Self-pay | Admitting: Internal Medicine

## 2019-12-01 NOTE — Telephone Encounter (Signed)
Pt is wanting a call back. She said someone called her from this number

## 2019-12-01 NOTE — Telephone Encounter (Signed)
Informed patient that it was not me calling.

## 2019-12-19 ENCOUNTER — Other Ambulatory Visit: Payer: Self-pay | Admitting: Cardiovascular Disease

## 2019-12-28 ENCOUNTER — Encounter: Payer: Self-pay | Admitting: Internal Medicine

## 2019-12-28 ENCOUNTER — Ambulatory Visit (INDEPENDENT_AMBULATORY_CARE_PROVIDER_SITE_OTHER): Payer: Medicare HMO | Admitting: Internal Medicine

## 2019-12-28 ENCOUNTER — Other Ambulatory Visit: Payer: Self-pay

## 2019-12-28 VITALS — BP 120/70 | HR 96 | Temp 98.1°F | Wt 147.0 lb

## 2019-12-28 DIAGNOSIS — I255 Ischemic cardiomyopathy: Secondary | ICD-10-CM | POA: Diagnosis not present

## 2019-12-28 DIAGNOSIS — Z23 Encounter for immunization: Secondary | ICD-10-CM | POA: Diagnosis not present

## 2019-12-28 DIAGNOSIS — Z7901 Long term (current) use of anticoagulants: Secondary | ICD-10-CM | POA: Diagnosis not present

## 2019-12-28 DIAGNOSIS — E782 Mixed hyperlipidemia: Secondary | ICD-10-CM

## 2019-12-28 DIAGNOSIS — I25810 Atherosclerosis of coronary artery bypass graft(s) without angina pectoris: Secondary | ICD-10-CM

## 2019-12-28 DIAGNOSIS — E1159 Type 2 diabetes mellitus with other circulatory complications: Secondary | ICD-10-CM | POA: Diagnosis not present

## 2019-12-28 DIAGNOSIS — I10 Essential (primary) hypertension: Secondary | ICD-10-CM

## 2019-12-28 DIAGNOSIS — Z1211 Encounter for screening for malignant neoplasm of colon: Secondary | ICD-10-CM

## 2019-12-28 DIAGNOSIS — I480 Paroxysmal atrial fibrillation: Secondary | ICD-10-CM

## 2019-12-28 DIAGNOSIS — Z853 Personal history of malignant neoplasm of breast: Secondary | ICD-10-CM

## 2019-12-28 DIAGNOSIS — E559 Vitamin D deficiency, unspecified: Secondary | ICD-10-CM | POA: Diagnosis not present

## 2019-12-28 DIAGNOSIS — Z951 Presence of aortocoronary bypass graft: Secondary | ICD-10-CM

## 2019-12-28 DIAGNOSIS — I5042 Chronic combined systolic (congestive) and diastolic (congestive) heart failure: Secondary | ICD-10-CM | POA: Diagnosis not present

## 2019-12-28 LAB — POCT GLYCOSYLATED HEMOGLOBIN (HGB A1C): Hemoglobin A1C: 7.1 % — AB (ref 4.0–5.6)

## 2019-12-28 MED ORDER — GLIPIZIDE 5 MG PO TABS: ORAL_TABLET | ORAL | 4 refills | Status: DC

## 2019-12-28 MED ORDER — EMPAGLIFLOZIN 25 MG PO TABS: 25.0000 mg | ORAL_TABLET | Freq: Every day | ORAL | 3 refills | Status: DC

## 2019-12-28 MED ORDER — TRULICITY 1.5 MG/0.5ML ~~LOC~~ SOAJ: SUBCUTANEOUS | 1 refills | Status: DC

## 2019-12-28 MED ORDER — METFORMIN HCL 1000 MG PO TABS: ORAL_TABLET | ORAL | 4 refills | Status: DC

## 2019-12-28 NOTE — Patient Instructions (Addendum)
-  Nice seeing you today!!  -Flu vaccine today.  -Schedule follow up in 4 months for your physical. Please come in fasting that day.

## 2019-12-28 NOTE — Addendum Note (Signed)
Addended by: Westley Hummer B on: 12/28/2019 04:22 PM   Modules accepted: Orders
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
## 2019-12-30 ENCOUNTER — Other Ambulatory Visit: Payer: Self-pay | Admitting: Cardiovascular Disease

## 2020-01-13 DIAGNOSIS — K432 Incisional hernia without obstruction or gangrene: Secondary | ICD-10-CM | POA: Diagnosis not present

## 2020-01-14 ENCOUNTER — Other Ambulatory Visit: Payer: Self-pay | Admitting: General Surgery

## 2020-01-14 DIAGNOSIS — K432 Incisional hernia without obstruction or gangrene: Secondary | ICD-10-CM

## 2020-01-31 ENCOUNTER — Other Ambulatory Visit: Payer: Self-pay | Admitting: Cardiovascular Disease

## 2020-02-01 ENCOUNTER — Other Ambulatory Visit: Payer: Self-pay

## 2020-02-01 ENCOUNTER — Ambulatory Visit
Admission: RE | Admit: 2020-02-01 | Discharge: 2020-02-01 | Disposition: A | Discharge status: Home / Self Care | Payer: Medicare HMO | Source: Ambulatory Visit | Attending: General Surgery | Admitting: General Surgery

## 2020-02-01 DIAGNOSIS — K439 Ventral hernia without obstruction or gangrene: Secondary | ICD-10-CM | POA: Diagnosis not present

## 2020-02-01 DIAGNOSIS — K432 Incisional hernia without obstruction or gangrene: Secondary | ICD-10-CM

## 2020-02-01 DIAGNOSIS — K458 Other specified abdominal hernia without obstruction or gangrene: Secondary | ICD-10-CM | POA: Diagnosis not present

## 2020-02-01 DIAGNOSIS — K5730 Diverticulosis of large intestine without perforation or abscess without bleeding: Secondary | ICD-10-CM | POA: Diagnosis not present

## 2020-02-01 DIAGNOSIS — I700 Atherosclerosis of aorta: Secondary | ICD-10-CM | POA: Diagnosis not present

## 2020-02-01 MED ORDER — IOPAMIDOL (ISOVUE-300) INJECTION 61%
100.0000 mL | Freq: Once | INTRAVENOUS | Status: AC | PRN
  Administered 2020-02-01: 100 mL via INTRAVENOUS

## 2020-02-06 ENCOUNTER — Other Ambulatory Visit: Payer: Self-pay | Admitting: Cardiovascular Disease

## 2020-02-06 ENCOUNTER — Other Ambulatory Visit: Payer: Self-pay | Admitting: Internal Medicine

## 2020-02-06 DIAGNOSIS — E1159 Type 2 diabetes mellitus with other circulatory complications: Secondary | ICD-10-CM

## 2020-02-06 DIAGNOSIS — I10 Essential (primary) hypertension: Secondary | ICD-10-CM

## 2020-02-08 ENCOUNTER — Ambulatory Visit (INDEPENDENT_AMBULATORY_CARE_PROVIDER_SITE_OTHER): Payer: Medicare HMO

## 2020-02-08 DIAGNOSIS — I480 Paroxysmal atrial fibrillation: Secondary | ICD-10-CM

## 2020-02-08 LAB — CUP PACEART REMOTE DEVICE CHECK
Battery Remaining Longevity: 5 mo
Battery Remaining Percentage: 7 %
Battery Voltage: 2.69 V
Brady Statistic AP VP Percent: 1 %
Brady Statistic AP VS Percent: 1 %
Brady Statistic AS VP Percent: 99 %
Brady Statistic AS VS Percent: 1 %
Brady Statistic RA Percent Paced: 1 %
Date Time Interrogation Session: 20211130020012
Implantable Lead Implant Date: 20140721
Implantable Lead Implant Date: 20141117
Implantable Lead Implant Date: 20141117
Implantable Lead Location: 753858
Implantable Lead Location: 753859
Implantable Lead Location: 753860
Implantable Lead Model: 5071
Implantable Pulse Generator Implant Date: 20141117
Lead Channel Impedance Value: 400 Ohm
Lead Channel Impedance Value: 410 Ohm
Lead Channel Impedance Value: 590 Ohm
Lead Channel Pacing Threshold Amplitude: 0.5 V
Lead Channel Pacing Threshold Amplitude: 0.75 V
Lead Channel Pacing Threshold Amplitude: 1.5 V
Lead Channel Pacing Threshold Pulse Width: 0.4 ms
Lead Channel Pacing Threshold Pulse Width: 0.4 ms
Lead Channel Pacing Threshold Pulse Width: 0.7 ms
Lead Channel Sensing Intrinsic Amplitude: 1 mV
Lead Channel Sensing Intrinsic Amplitude: 12 mV
Lead Channel Setting Pacing Amplitude: 2 V
Lead Channel Setting Pacing Amplitude: 2.5 V
Lead Channel Setting Pacing Amplitude: 2.5 V
Lead Channel Setting Pacing Pulse Width: 0.4 ms
Lead Channel Setting Pacing Pulse Width: 0.7 ms
Lead Channel Setting Sensing Sensitivity: 2 mV
Pulse Gen Model: 3222
Pulse Gen Serial Number: 2981305

## 2020-02-14 NOTE — Progress Notes (Signed)
Remote pacemaker transmission.   

## 2020-02-23 ENCOUNTER — Encounter: Payer: Self-pay | Admitting: Internal Medicine

## 2020-02-23 NOTE — Telephone Encounter (Signed)
Spoke with patient and her husband has a sinus infection and was treated at an urgent care.

## 2020-03-07 ENCOUNTER — Telehealth: Payer: Medicare HMO | Admitting: Physician Assistant

## 2020-03-07 DIAGNOSIS — Z20822 Contact with and (suspected) exposure to covid-19: Secondary | ICD-10-CM | POA: Diagnosis not present

## 2020-03-07 DIAGNOSIS — R0981 Nasal congestion: Secondary | ICD-10-CM | POA: Diagnosis not present

## 2020-03-07 MED ORDER — FLUTICASONE PROPIONATE 50 MCG/ACT NA SUSP
2.0000 | Freq: Every day | NASAL | 0 refills | Status: DC
Start: 2020-03-07 — End: 2020-07-31

## 2020-03-07 NOTE — Progress Notes (Signed)
E-Visit for Corona Virus Screening  Your current symptoms could be consistent with the coronavirus or influenza.  I would highly recommend testing.  Many health care providers can now test patients at their office but not all are.  [facility] has multiple testing sites. For information on our COVID testing locations and hours go to [email or web address]  We are enrolling you in our MyChart Home Monitoring for COVID19 . Daily you will receive a questionnaire within the MyChart website. Our COVID 19 response team will be monitoring your responses daily.  Testing Information: The COVID-19 Community Testing sites are testing BY APPOINTMENT ONLY.  You can schedule online at [email or web address]  If you do not have access to a smart phone or computer you may call [phone] for an appointment.   Additional testing sites in the Community:  . For [facility] Testing sites in [facility]  [email or web address]  . For Pop-up testing sites in [place]  [email or web address]  . For [facility] [email or web address]  . For [facility] testing in [place] and [employer] [email or web address]  . For [facility] testing in [facility]   [email or web address]  For  more information about community testing call [phone]   Please quarantine yourself while awaiting your test results. Please stay home for a minimum of 10 days from the first day of illness with improving symptoms and you have had 24 hours of no fever (without the use of Tylenol (Acetaminophen) Motrin (Ibuprofen) or any fever reducing medication).  Also - Do not get tested prior to returning to work because once you  have had a positive test the test can stay positive for more than a month in some cases.   You should wear a mask or cloth face covering over your nose and mouth if you must be around other people or animals, including pets (even at home). Try to stay at least 6 feet away from other people. This will protect the people around you.  Please continue good preventive care measures, including:  frequent hand-washing, avoid touching your face, cover coughs/sneezes, stay out of crowds and keep a 6 foot distance from others.  COVID-19 is a respiratory illness with symptoms that are similar to the flu. Symptoms are typically mild to moderate, but there have been cases of severe illness and death due to the virus.   The following symptoms may appear 2-14 days after exposure: . Fever . Cough . Shortness of breath or difficulty breathing . Chills . Repeated shaking with chills . Muscle pain . Headache . Sore throat . New loss of taste or smell . Fatigue . Congestion or runny nose . Nausea or vomiting . Diarrhea  Go to the nearest hospital ED for assessment if fever/cough/breathlessness are severe or illness seems like a threat to life.  It is vitally important that if you feel that you have an infection such as this virus or any other virus that you stay home and away from places where you may spread it to others.  You should avoid contact with people age 68 and older.   You may also take acetaminophen (Tylenol) as needed for fever. I will prescribe flonase to help with nasal congestion.  Reduce your risk of any infection by using the same precautions used for avoiding the common cold or flu:  Marland Kitchen Wash your hands often with soap and warm water for at least 20 seconds.  If soap and water are not readily available, use an alcohol-based hand sanitizer with at least 60%  alcohol.  . If coughing or sneezing, cover your mouth and nose by coughing or sneezing into the elbow areas of your shirt or coat, into a  tissue or into your sleeve (not your hands). . Avoid shaking hands with others and consider head nods or verbal greetings only. . Avoid touching your eyes, nose, or mouth with unwashed hands.  . Avoid close contact with people who are sick. . Avoid places or events with large numbers of people in one location, like concerts or sporting events. . Carefully consider travel plans you have or are making. . If you are planning any travel outside or inside the [place], visit the CDC's Travelers' Health webpage for the latest health notices. . If you have some symptoms but not all symptoms, continue to monitor at home and seek medical attention if your symptoms worsen. . If you are having a medical emergency, call 911.  HOME CARE . Only take medications as instructed by your medical team. . Drink plenty of fluids and get plenty of rest. . A steam or ultrasonic humidifier can help if you have congestion.   GET HELP RIGHT AWAY IF YOU HAVE EMERGENCY WARNING SIGNS** FOR COVID-19. If you or someone is showing any of these signs seek emergency medical care immediately. Call 911 or proceed to your closest emergency facility if: . You develop worsening high fever. . Trouble breathing . Bluish lips or face . Persistent pain or pressure in the chest . New confusion . Inability to wake or stay awake . You cough up blood. . Your symptoms become more severe  **This list is not all possible symptoms. Contact your medical provider for any symptoms that are sever or concerning to you.  MAKE SURE YOU   Understand these instructions.  Will watch your condition.  Will get help right away if you are not doing well or get worse.  Your e-visit answers were reviewed by a board certified advanced clinical practitioner to complete your personal care plan.  Depending on the condition, your plan could have included both over the counter or prescription medications.  If there is a problem please reply once you have  received a response from your provider.  Your safety is important to [place].  If you have drug allergies check your prescription carefully.    You can use MyChart to ask questions about today's visit, request a non-urgent call back, or ask for a work or school excuse for 24 hours related to this e-Visit. If it has been greater than 24 hours you will need to follow up with your provider, or enter a new e-Visit to address those concerns. You will get an e-mail in the next two days asking about your experience.  I hope that your e-visit has been valuable and will speed your recovery. Thank you for using e-visits.   Greater than 5 minutes, yet less than 10 minutes of time have been spent researching, coordinating, and implementing care for this patient today

## 2020-03-19 NOTE — Progress Notes (Addendum)
Cardiology Office Note:    Date:  03/20/2020   ID:  [name], DOB Oct 12, 1950, MRN [id number]  PCP:  [name], Michelle Halsted, MD  Cardiologist:  Michelle Klein, MD    Referring MD: [name], [place]*   Chief Complaint  Patient presents with  . Pacemaker Check  . Congestive Heart Failure  . Coronary Artery Disease   CRT-P  History of Present Illness:    [name] is a 70 y.o. female with a hx of CAD s/p CABG 2014, paroxysmal atrial fibrillation, CHF with recovery of EF after CRT-P (St. Jude), type 2 diabetes mellitus, hypercholesterolemia, hypertension returning for follow-up.  The patient specifically denies any chest pain at rest exertion, dyspnea at rest or with exertion, orthopnea, paroxysmal nocturnal dyspnea, syncope, palpitations, focal neurological deficits, intermittent claudication, lower extremity edema, unexplained weight gain, cough, hemoptysis or wheezing. She had a kitchen accident with scalding of her left leg, but it is healing well. Denies falls and bleeding.  Her pacemaker ([place] allure implanted 2014) is now < 3 months to ERI. All lead parameters are excellent. She has not had true atrial fibrillation since December 2020, has had occasional 4-8" episodes of nonsustained irregular atrial tachycardia.  ECG is nondiagnostic due to biventricular pacing (she had an LBBB before).  Nuclear stress test in February 2021 showed very minor abnormalities: A previously described fixed apical defect, and a possible small area of basal inferolateral ischemia.  EF was 51%.  She has not had repeat cardiac catheterization since her bypass surgery in 2014.  She has good glycemic control with a hemoglobin A1c of 7.1% and an excellent lipid profile with an LDL cholesterol of 19 on Repatha.  [name] presented with myocardial infarction in 2004. In May 2014 presented with congestive heart failure and atrial fibrillation in the setting of three-vessel coronary artery  disease leading to bypass surgery (July 2014, Dr. Servando [name], LIMA to LAD, SVG to ramus intermedius, sequential SVG to OM1 and OM 2, surgical maze procedure). She had persistent depressed left ventricular systolic function and received a dual-chamber biventricular pacemaker (St. Jude Allure, [name] in November 2014).   Last echo February 2016 shows recovery of left ventricular ejection fraction 50-55%. In January 2015, she was admitted with acute on chronic heart failure due to excessive intravenous fluid administration during an episode of small bowel obstruction.. She has type 2 diabetes mellitus that has been difficult to control as well as hypertension and hyperlipidemia.  Nuclear stress test 04/28/2019  The left ventricular ejection fraction is mildly decreased (45-54%).  Nuclear stress EF: 51%.  There was no ST segment deviation noted during stress.  No T wave inversion was noted during stress.  Defect 1: There is a small defect of mild severity present in the apex location.  Defect 2: There is a small defect of mild severity present in the basal inferolateral location.  This is a low risk study.  Findings consistent with prior myocardial infarction.   Low risk nuclear scan with a small apical scar (previously described) and a possibly new small area of mild inferolateral ischemia (sum difference 4%) and borderline reduction in left ventricular systolic function ([id number]).   Past Medical History:  Diagnosis Date  . Acute on chronic combined systolic and diastolic CHF, NYHA class 3 ([name]) 03/19/2013  . AICD (automatic cardioverter/defibrillator) present 01/2013   Biventricular cardiac pacemaker in situ   . Allergic rhinitis   . Anogenital warts 01/22/2011  . Anxiety state, unspecified 05/13/2013  .  BREAST CANCER, HX OF 09/19/2009   Qualifier: Diagnosis of  By: Michelle Mocha MD, [name]   . CAD (coronary artery disease)    a. 2004: s/p MI in [place]. No PCI->Medical RX;  b. 07/2012 Cath: LM  30-40, LAD 70p, 70/21m, D1 80-90p, OM1 small 90p, OM2 large 80-90p, 31m, 70-80d, RCA 20-30 diff, EF 40%, glob HK.s/p CABG  . Cancer of left breast [facility]) 2002   Patient reports left breast cancer diagnosis in 2002 treated with bilateral mastectomy positive lymph nodes with left axillary dissection followed by chemotherapy of unknown type  . Cataract   . Chronic combined systolic and diastolic CHF, NYHA class 2 ([name]) 01/08/2013  . Diabetes mellitus type II   . Exertional dyspnea 08/05/2014  . Exertional shortness of breath   . Generalized osteoarthrosis, involving multiple sites 08/25/2015  . Hyperlipidemia   . Hypertension   . Incisional hernia, without obstruction or gangrene 05/04/2015  . Ischemic cardiomyopathy    a. 07/2012 Echo: EF 35%, Sev inferoseptal HK, mildly dil LA, Peak PASP 50mmHg.  Marland Kitchen LBBB (left bundle branch block)    a. intermittent - present during rapid afib 07/2012.  Marland Kitchen MYOCARDIAL INFARCTION, HX OF 09/19/2009   Qualifier: Diagnosis of  By: Michelle Mocha MD, [name]   . Neuromuscular disorder ([place])    Patient reports chronic numbness in the right foot related to previous surgery on the right leg and "nerve damage"  . PAF (paroxysmal atrial fibrillation) ([name])    a. 07/2012: Amio and xarelto initiated.  . Right carotid bruit 08/11/2012  . SBO (small bowel obstruction) ([place])   . SOB (shortness of breath) 02/26/2018  . Type 2 diabetes mellitus with circulatory disorder, without long-term current use of insulin ([place]) 10/03/2015  . Vitamin D deficiency 02/22/2016    Past Surgical History:  Procedure Laterality Date  . ABDOMINAL HYSTERECTOMY  2000  . APPENDECTOMY  1974  . BI-VENTRICULAR PACEMAKER INSERTION N/A 01/25/2013   Procedure: BI-VENTRICULAR PACEMAKER INSERTION (CRT-P);  Surgeon: Evans Lance, MD;  Location: [facility] CATH LAB;  Service: Cardiovascular;  Laterality: N/A;  . BREAST BIOPSY Left 2002  . CARDIAC CATHETERIZATION    . CHOLECYSTECTOMY OPEN  1974  . CORONARY ARTERY BYPASS  GRAFT N/A 09/28/2012   Procedure: CORONARY ARTERY BYPASS GRAFTING (CABG);  Surgeon: [name], MD;  Location: [place];  Service: Open Heart Surgery;  Laterality: N/A;  CABG x four, using left internal mammary artery and left leg greater saphenous vein harvested endoscopically  . DILATION AND CURETTAGE OF UTERUS  1983  . EPICARDIAL PACING LEAD PLACEMENT N/A 09/28/2012   Procedure: EPICARDIAL PACING LEAD PLACEMENT;  Surgeon: [name], MD;  Location: [place];  Service: Thoracic;  Laterality: N/A;  LV LEAD PLACEMENT  . HERNIA REPAIR    . INCISIONAL HERNIA REPAIR N/A 05/25/2015   Procedure: LAPAROSCOPIC INCISIONAL HERNIA WITH MESH ;  Surgeon: [name], MD;  Location: [place];  Service: General;  Laterality: N/A;  . INCONTINENCE SURGERY  2000  . INSERTION OF MESH N/A 05/25/2015   Procedure: INSERTION OF MESH;  Surgeon: [name], MD;  Location: [place];  Service: General;  Laterality: N/A;  . INTRAOPERATIVE TRANSESOPHAGEAL ECHOCARDIOGRAM N/A 09/28/2012   Procedure: INTRAOPERATIVE TRANSESOPHAGEAL ECHOCARDIOGRAM;  Surgeon: [name], MD;  Location: [place];  Service: Open Heart Surgery;  Laterality: N/A;  . LAPAROSCOPIC INCISIONAL / UMBILICAL / VENTRAL HERNIA REPAIR  05/25/2015   IHR  . LEFT HEART CATHETERIZATION WITH CORONARY ANGIOGRAM N/A 07/20/2012   Procedure:  LEFT HEART CATHETERIZATION WITH CORONARY ANGIOGRAM;  Surgeon: [name], MD;  Location: [facility] CATH LAB;  Service: Cardiovascular;  Laterality: N/A;  . MASTECTOMY Right 2002  . MASTECTOMY MODIFIED RADICAL W/ AXILLARY LYMPH NODES W/ OR W/O PECTORALIS MINOR Left 2002  . MAZE N/A 09/28/2012   Procedure: MAZE;  Surgeon: [name], MD;  Location: [place];  Service: Open Heart Surgery;  Laterality: N/A;  . ORIF WRIST FRACTURE Right 11/08/2017   Procedure: OPEN REDUCTION INTERNAL FIXATION (ORIF) WRIST FRACTURE;  Surgeon: [name], MD;  Location: [place];  Service: Orthopedics;  Laterality: Right;  90 mins  .  TENDON REPAIR Right 2001 X 3-4   torn ligaments and tendons in ankle up to knee from work related accident  . TUBAL LIGATION  1987    Current Medications: Current Meds  Medication Sig  . APPLE CIDER VINEGAR PO Take 1 tablet by mouth in the morning, at noon, and at bedtime.  . Blood Glucose Monitoring Suppl (TRUE METRIX AIR GLUCOSE METER) DEVI Check blood sugar 2 times a day  . carvedilol (COREG) 12.5 MG tablet Take 1 tablet (12.5 mg total) by mouth 2 (two) times daily with a meal.  . Dulaglutide (TRULICITY) 1.5 0000000 SOPN INJECT 1.5MG  (1 PEN) SUBCUTANEOUSLY EVERY WEEK  . empagliflozin (JARDIANCE) 25 MG TABS tablet Take 1 tablet (25 mg total) by mouth daily before breakfast.  . fluticasone (FLONASE) 50 MCG/ACT nasal spray Place 2 sprays into both nostrils daily.  . furosemide (LASIX) 20 MG tablet TAKE 1 TABLET EVERY DAY  . glipiZIDE (GLUCOTROL) 5 MG tablet TAKE ONE TABLET BY MOUTH TWICE A DAY BEFORE A MEAL  . glucose blood (TRUE METRIX BLOOD GLUCOSE TEST) test strip Use to check blood sugar 2 times a day.  . isosorbide mononitrate (IMDUR) 60 MG 24 hr tablet TAKE 1 TABLET EVERY DAY  . losartan (COZAAR) 25 MG tablet TAKE 1 TABLET TWICE DAILY  . metFORMIN (GLUCOPHAGE) 1000 MG tablet TAKE ONE TABLET BY MOUTH TWICE A DAY WITH FOOD  . nitroGLYCERIN (NITROSTAT) 0.4 MG SL tablet Place 1 tablet (0.4 mg total) under the tongue every 5 (five) minutes as needed for chest pain.  Marland Kitchen REPATHA SURECLICK [id number] MG/ML SOAJ INJECT 140 MG INTO THE SKIN EVERY 14 (FOURTEEN) DAYS.  Marland Kitchen rivaroxaban (XARELTO) 20 MG TABS tablet Take 1 tablet (20 mg total) by mouth daily with supper.  . TRUEplus Lancets 33G MISC Use to check blood sugar 2 times a day.     Allergies:   Statins   Social History   Socioeconomic History  . Marital status: Married    Spouse name: Not on file  . Number of children: 3  . Years of education: Not on file  . Highest education level: Not on file  Occupational History  . Occupation: Marketing executive  work    Fish farm manager: [employer]. [employer].  Tobacco Use  . Smoking status: Former Smoker    Packs/day: 1.00    Years: 10.00    Pack years: 10.00    Types: Cigarettes    Quit date: 03/11/2012    Years since quitting: 8.0  . Smokeless tobacco: Never Used  Vaping Use  . Vaping Use: Never used  Substance and Sexual Activity  . Alcohol use: No  . Drug use: No  . Sexual activity: Yes    Partners: Male    Birth control/protection: Surgical  Other Topics Concern  . Not on file  Social History Narrative   Lives with husband and  mother in law.     Regular exercise: walking   Caffeine use: 2 cups of coffee in the morning   Social Determinants of Health   Financial Resource Strain: Not on file  Food Insecurity: Not on file  Transportation Needs: Not on file  Physical Activity: Not on file  Stress: Not on file  Social Connections: Not on file     Family History: The patient's family history includes Arthritis in an other family member; Colon cancer in her sister; Diabetes in her mother and another family member; Heart disease in her mother; Hyperlipidemia in an other family member; Kidney failure in her mother; Liver cancer in her brother; Ovarian cancer in an other family member. There is no history of Stomach cancer, Rectal cancer, Esophageal cancer, or Pancreatic cancer. ROS:   Please see the history of present illness.    All other systems are reviewed and are negative.   EKGs/Labs/Other Studies Reviewed:   Nuclear stress test 04/28/2019  The left ventricular ejection fraction is mildly decreased (45-54%).  Nuclear stress EF: 51%.  There was no ST segment deviation noted during stress.  No T wave inversion was noted during stress.  Defect 1: There is a small defect of mild severity present in the apex location.  Defect 2: There is a small defect of mild severity present in the basal inferolateral location.  This is a low risk study.  Findings consistent with prior  myocardial infarction.   Low risk nuclear scan with a small apical scar (previously described) and a possibly new small area of mild inferolateral ischemia (sum difference 4%) and borderline reduction in left ventricular systolic function (Q000111Q).   EKG:  EKG is ordered today shows atrial sensed, biventricular paced rhythm with a prominent positive R wave in lead V1  Recent Labs: 04/19/2019: ALT 38 09/17/2019: BUN 17; Creatinine, Ser 0.73; Hemoglobin 13.1; Platelets 241; Potassium 4.1; Sodium 136  Recent Lipid Panel    Component Value Date/Time   CHOL 100 04/19/2019 0854   TRIG 115 04/19/2019 0854   HDL 61 04/19/2019 0854   CHOLHDL 1.6 04/19/2019 0854   CHOLHDL 5.2 (H) 04/04/2016 0723   VLDL NOT CALC 04/04/2016 0723   LDLCALC 19 04/19/2019 0854   LDLDIRECT 50 04/04/2016 0723    Physical Exam:    VS:  BP (!) 114/58   Pulse 89   Ht 5' 0.5" (1.537 m)   Wt 151 lb 9.6 oz (68.8 kg)   SpO2 97%   BMI 29.12 kg/m     Wt Readings from Last 3 Encounters:  03/20/20 152 lb (68.9 kg)  03/20/20 151 lb 9.6 oz (68.8 kg)  12/28/19 147 lb (66.7 kg)     General: Alert, oriented x3, no distress, healthy left subclavian device site Head: no evidence of trauma, PERRL, EOMI, no exophtalmos or lid lag, no myxedema, no xanthelasma; normal ears, nose and oropharynx Neck: normal jugular venous pulsations and no hepatojugular reflux; brisk carotid pulses without delay and no carotid bruits Chest: clear to auscultation, no signs of consolidation by percussion or palpation, normal fremitus, symmetrical and full respiratory excursions Cardiovascular: normal position and quality of the apical impulse, regular rhythm, normal first and second heart sounds, no murmurs, rubs or gallops Abdomen: no tenderness or distention, no masses by palpation, no abnormal pulsatility or arterial bruits, normal bowel sounds, no hepatosplenomegaly Extremities: Healing wounds of scalding left lower extremity; no clubbing,  cyanosis or edema; 2+ radial, ulnar and brachial pulses bilaterally; 2+ right femoral, posterior  tibial and dorsalis pedis pulses; 2+ left femoral, posterior tibial and dorsalis pedis pulses; no subclavian or femoral bruits Neurological: grossly nonfocal Psych: Normal mood and affect    ASSESSMENT:    1. Coronary artery disease involving native coronary artery of native heart without angina pectoris   2. Chronic combined systolic and diastolic heart failure ([place])   3. Type 2 diabetes mellitus with other circulatory complication, without long-term current use of insulin (HCC)   4. Paroxysmal atrial fibrillation ([place])   5. Long term current use of anticoagulant   6. Mixed hyperlipidemia   7. Essential hypertension   8. Status post biventricular pacemaker   9. Preoperative cardiovascular examination    PLAN:    In order of problems listed above:  1. CAD s/p CABG: She has not had recent chest discomfort.  She had a low risk recent nuclear stress test.  Past episodes of abrupt onset and unpredictable chest discomfort appeared to improve following initiation of treatment with long-acting nitrates.  Not sure whether this is because we were treating coronary spasm or esophageal spasm.      2. CHF: Asymptomatic (NYHA functional class I) and clinically euvolemic.  She is a CRT-P hyper responder.   Prior to CRT-P LVEF was 35-40% and she had a lot of problems with heart failure exacerbation, but following resynchronization therapy EF was 50-55% and she has not had problems with heart failure in a very long time.  On ARB, carvedilol and SGLT2 inhibitor, very low-dose of loop diuretic. 3. DM: Hemoglobin A1c slightly above target range at 7.1% last October. 4. AFib: No recent episodes of true atrial fibrillation.  She does have occasional atrial tachycardia.  She did have a MAZE procedure at the time of bypass.   Continue anticoagulation.. CHADSVasc 99 (age, gender,  CHF, CAD, DM, HTN). 5. Xarelto:  Well-tolerated, no bleeding complications.  6. HLP: She has had an exceptional response to Repatha and was intolerant of multiple statins due to severe myalgias.  He is due for repeat lipid profile, which we will schedule when she undergoes her preop labs for her upcoming colonoscopy or hernia surgery. 7. HTN: Excellent control 8. CRT-P: Normal device function, continue remote downloads every 3 months 9. Preop cardiovascular exam: Despite her numerous medical problems, all the cardiac issues are currently well compensated.  I think she is at low risk for serious cardiovascular complications with colonoscopy or with the umbilical hernia surgery.  Obviously she will have to hold her anticoagulant for a couple of days before each procedure.  I assured her that the risk of ischemic/embolic complications is very low, especially since she has had an extremely low burden of atrial fibrillation recently.  We will send letters of communication to both Dr. Loletha Carrow and Dr. Donne Hazel.  Total time spent with patient: 48 minutes.  Patient Instructions  Medication Instructions:  No changes *If you need a refill on your cardiac medications before your next appointment, please call your pharmacy*   Lab Work: Your provider would like for you to have the following labs: Lipid  If you have labs (blood work) drawn today and your tests are completely normal, you will receive your results only by: Marland Kitchen MyChart Message (if you have MyChart) OR . A paper copy in the mail If you have any lab test that is abnormal or we need to change your treatment, we will call you to review the results.   Testing/Procedures: None ordered   Follow-Up: At [facility], you and your  health needs are our priority.  As part of our continuing mission to provide you with exceptional heart care, we have created designated Provider Care Teams.  These Care Teams include your primary Cardiologist (physician) and Advanced Practice Providers  (APPs -  Physician Assistants and Nurse Practitioners) who all work together to provide you with the care you need, when you need it.  We recommend signing up for the patient portal called "MyChart".  Sign up information is provided on this After Visit Summary.  MyChart is used to connect with patients for Virtual Visits (Telemedicine).  Patients are able to view lab/test results, encounter notes, upcoming appointments, etc.  Non-urgent messages can be sent to your provider as well.   To learn more about what you can do with MyChart, go to [email or web address].    Your next appointment:   3 month(s) on a pacer day  The format for your next appointment:   In Person  Provider:   [name], MD    . Medication Adjustments/Labs and Tests Ordered: Current medicines are reviewed at length with the patient today.  Concerns regarding medicines are outlined above.  Orders Placed This Encounter  Procedures  . Lipid panel  . EKG 12-Lead   No orders of the defined types were placed in this encounter.  Patient Instructions  Medication Instructions:  No changes *If you need a refill on your cardiac medications before your next appointment, please call your pharmacy*   Lab Work: Your provider would like for you to have the following labs: Lipid  If you have labs (blood work) drawn today and your tests are completely normal, you will receive your results only by: Marland Kitchen MyChart Message (if you have MyChart) OR . A paper copy in the mail If you have any lab test that is abnormal or we need to change your treatment, we will call you to review the results.   Testing/Procedures: None ordered   Follow-Up: At [facility], you and your health needs are our priority.  As part of our continuing mission to provide you with exceptional heart care, we have created designated Provider Care Teams.  These Care Teams include your primary Cardiologist (physician) and Advanced Practice Providers  (APPs -  Physician Assistants and Nurse Practitioners) who all work together to provide you with the care you need, when you need it.  We recommend signing up for the patient portal called "MyChart".  Sign up information is provided on this After Visit Summary.  MyChart is used to connect with patients for Virtual Visits (Telemedicine).  Patients are able to view lab/test results, encounter notes, upcoming appointments, etc.  Non-urgent messages can be sent to your provider as well.   To learn more about what you can do with MyChart, go to [email or web address].    Your next appointment:   3 month(s) on a pacer day  The format for your next appointment:   In Person  Provider:   [name], MD     Signed, Michelle Klein, MD  03/20/2020 7:12 PM    [place]

## 2020-03-19 NOTE — H&P (View-Only) (Signed)
Cardiology Office Note:    Date:  03/20/2020   ID:  [name], DOB 09-Mar-1951, MRN [id number]  PCP:  Isaac Bliss, Rayford Halsted, MD  Cardiologist:  [name], MD    Referring MD: Isaac Bliss, [place]*   Chief Complaint  Patient presents with  . Pacemaker Check  . Congestive Heart Failure  . Coronary Artery Disease   CRT-P  History of Present Illness:    [name] is a 70 y.o. female with a hx of CAD s/p CABG 2014, paroxysmal atrial fibrillation, CHF with recovery of EF after CRT-P (St. Jude), type 2 diabetes mellitus, hypercholesterolemia, hypertension returning for follow-up.  The patient specifically denies any chest pain at rest exertion, dyspnea at rest or with exertion, orthopnea, paroxysmal nocturnal dyspnea, syncope, palpitations, focal neurological deficits, intermittent claudication, lower extremity edema, unexplained weight gain, cough, hemoptysis or wheezing. She had a kitchen accident with scalding of her left leg, but it is healing well. Denies falls and bleeding.  Her pacemaker ([place] allure implanted 2014) is now < 3 months to ERI. All lead parameters are excellent. She has not had true atrial fibrillation since December 2020, has had occasional 4-8" episodes of nonsustained irregular atrial tachycardia.  ECG is nondiagnostic due to biventricular pacing (she had an LBBB before).  Nuclear stress test in February 2021 showed very minor abnormalities: A previously described fixed apical defect, and a possible small area of basal inferolateral ischemia.  EF was 51%.  She has not had repeat cardiac catheterization since her bypass surgery in 2014.  She has good glycemic control with a hemoglobin A1c of 7.1% and an excellent lipid profile with an LDL cholesterol of 19 on Repatha.  [name] presented with myocardial infarction in 2004. In May 2014 presented with congestive heart failure and atrial fibrillation in the setting of three-vessel coronary artery  disease leading to bypass surgery (July 2014, Dr. Servando Snare, LIMA to LAD, SVG to ramus intermedius, sequential SVG to OM1 and OM 2, surgical maze procedure). She had persistent depressed left ventricular systolic function and received a dual-chamber biventricular pacemaker ([employer] Jude Allure, Dr. Lovena Le in November 2014).   Last echo February 2016 shows recovery of left ventricular ejection fraction 50-55%. In January 2015, she was admitted with acute on chronic heart failure due to excessive intravenous fluid administration during an episode of small bowel obstruction.. She has type 2 diabetes mellitus that has been difficult to control as well as hypertension and hyperlipidemia.  Nuclear stress test 04/28/2019  The left ventricular ejection fraction is mildly decreased (45-54%).  Nuclear stress EF: 51%.  There was no ST segment deviation noted during stress.  No T wave inversion was noted during stress.  Defect 1: There is a small defect of mild severity present in the apex location.  Defect 2: There is a small defect of mild severity present in the basal inferolateral location.  This is a low risk study.  Findings consistent with prior myocardial infarction.   Low risk nuclear scan with a small apical scar (previously described) and a possibly new small area of mild inferolateral ischemia (sum difference 4%) and borderline reduction in left ventricular systolic function ([id number]).   Past Medical History:  Diagnosis Date  . Acute on chronic combined systolic and diastolic CHF, NYHA class 3 ([place]) 03/19/2013  . AICD (automatic cardioverter/defibrillator) present 01/2013   Biventricular cardiac pacemaker in situ   . Allergic rhinitis   . Anogenital warts 01/22/2011  . Anxiety state, unspecified 05/13/2013  .  BREAST CANCER, HX OF 09/19/2009   Qualifier: Diagnosis of  By: [name], Jory Ee   . CAD (coronary artery disease)    a. 2004: s/p MI in [place]. No PCI->Medical RX;  b. 07/2012 Cath: LM  30-40, LAD 70p, 70/21m, D1 80-90p, OM1 small 90p, OM2 large 80-90p, 31m, 70-80d, RCA 20-30 diff, EF 40%, glob HK.s/p CABG  . Cancer of left breast [facility]) 2002   Patient reports left breast cancer diagnosis in 2002 treated with bilateral mastectomy positive lymph nodes with left axillary dissection followed by chemotherapy of unknown type  . Cataract   . Chronic combined systolic and diastolic CHF, NYHA class 2 ([place]) 01/08/2013  . Diabetes mellitus type II   . Exertional dyspnea 08/05/2014  . Exertional shortness of breath   . Generalized osteoarthrosis, involving multiple sites 08/25/2015  . Hyperlipidemia   . Hypertension   . Incisional hernia, without obstruction or gangrene 05/04/2015  . Ischemic cardiomyopathy    a. 07/2012 Echo: EF 35%, Sev inferoseptal HK, mildly dil LA, Peak PASP 50mmHg.  Marland Kitchen LBBB (left bundle branch block)    a. intermittent - present during rapid afib 07/2012.  Marland Kitchen MYOCARDIAL INFARCTION, HX OF 09/19/2009   Qualifier: Diagnosis of  By: [name], Jory Ee   . Neuromuscular disorder ([place])    Patient reports chronic numbness in the right foot related to previous surgery on the right leg and "nerve damage"  . PAF (paroxysmal atrial fibrillation) ([place])    a. 07/2012: Amio and xarelto initiated.  . Right carotid bruit 08/11/2012  . SBO (small bowel obstruction) ([place])   . SOB (shortness of breath) 02/26/2018  . Type 2 diabetes mellitus with circulatory disorder, without long-term current use of insulin ([place]) 10/03/2015  . Vitamin D deficiency 02/22/2016    Past Surgical History:  Procedure Laterality Date  . ABDOMINAL HYSTERECTOMY  2000  . APPENDECTOMY  1974  . BI-VENTRICULAR PACEMAKER INSERTION N/A 01/25/2013   Procedure: BI-VENTRICULAR PACEMAKER INSERTION (CRT-P);  Surgeon: Evans Lance, MD;  Location: [facility] CATH LAB;  Service: Cardiovascular;  Laterality: N/A;  . BREAST BIOPSY Left 2002  . CARDIAC CATHETERIZATION    . CHOLECYSTECTOMY OPEN  1974  . CORONARY ARTERY BYPASS  GRAFT N/A 09/28/2012   Procedure: CORONARY ARTERY BYPASS GRAFTING (CABG);  Surgeon: [name], MD;  Location: [place];  Service: Open Heart Surgery;  Laterality: N/A;  CABG x four, using left internal mammary artery and left leg greater saphenous vein harvested endoscopically  . DILATION AND CURETTAGE OF UTERUS  1983  . EPICARDIAL PACING LEAD PLACEMENT N/A 09/28/2012   Procedure: EPICARDIAL PACING LEAD PLACEMENT;  Surgeon: [name], MD;  Location: [place];  Service: Thoracic;  Laterality: N/A;  LV LEAD PLACEMENT  . HERNIA REPAIR    . INCISIONAL HERNIA REPAIR N/A 05/25/2015   Procedure: LAPAROSCOPIC INCISIONAL HERNIA WITH MESH ;  Surgeon: [name], MD;  Location: [place];  Service: General;  Laterality: N/A;  . INCONTINENCE SURGERY  2000  . INSERTION OF MESH N/A 05/25/2015   Procedure: INSERTION OF MESH;  Surgeon: [name], MD;  Location: [place];  Service: General;  Laterality: N/A;  . INTRAOPERATIVE TRANSESOPHAGEAL ECHOCARDIOGRAM N/A 09/28/2012   Procedure: INTRAOPERATIVE TRANSESOPHAGEAL ECHOCARDIOGRAM;  Surgeon: [name], MD;  Location: [place];  Service: Open Heart Surgery;  Laterality: N/A;  . LAPAROSCOPIC INCISIONAL / UMBILICAL / VENTRAL HERNIA REPAIR  05/25/2015   IHR  . LEFT HEART CATHETERIZATION WITH CORONARY ANGIOGRAM N/A 07/20/2012   Procedure:  LEFT HEART CATHETERIZATION WITH CORONARY ANGIOGRAM;  Surgeon: [name], MD;  Location: [facility] CATH LAB;  Service: Cardiovascular;  Laterality: N/A;  . MASTECTOMY Right 2002  . MASTECTOMY MODIFIED RADICAL W/ AXILLARY LYMPH NODES W/ OR W/O PECTORALIS MINOR Left 2002  . MAZE N/A 09/28/2012   Procedure: MAZE;  Surgeon: [name], MD;  Location: [place];  Service: Open Heart Surgery;  Laterality: N/A;  . ORIF WRIST FRACTURE Right 11/08/2017   Procedure: OPEN REDUCTION INTERNAL FIXATION (ORIF) WRIST FRACTURE;  Surgeon: [name], MD;  Location: [place];  Service: Orthopedics;  Laterality: Right;  90 mins  .  TENDON REPAIR Right 2001 X 3-4   torn ligaments and tendons in ankle up to knee from work related accident  . TUBAL LIGATION  1987    Current Medications: Current Meds  Medication Sig  . APPLE CIDER VINEGAR PO Take 1 tablet by mouth in the morning, at noon, and at bedtime.  . Blood Glucose Monitoring Suppl (TRUE METRIX AIR GLUCOSE METER) DEVI Check blood sugar 2 times a day  . carvedilol (COREG) 12.5 MG tablet Take 1 tablet (12.5 mg total) by mouth 2 (two) times daily with a meal.  . Dulaglutide (TRULICITY) 1.5 0000000 SOPN INJECT 1.5MG  (1 PEN) SUBCUTANEOUSLY EVERY WEEK  . empagliflozin (JARDIANCE) 25 MG TABS tablet Take 1 tablet (25 mg total) by mouth daily before breakfast.  . fluticasone (FLONASE) 50 MCG/ACT nasal spray Place 2 sprays into both nostrils daily.  . furosemide (LASIX) 20 MG tablet TAKE 1 TABLET EVERY DAY  . glipiZIDE (GLUCOTROL) 5 MG tablet TAKE ONE TABLET BY MOUTH TWICE A DAY BEFORE A MEAL  . glucose blood (TRUE METRIX BLOOD GLUCOSE TEST) test strip Use to check blood sugar 2 times a day.  . isosorbide mononitrate (IMDUR) 60 MG 24 hr tablet TAKE 1 TABLET EVERY DAY  . losartan (COZAAR) 25 MG tablet TAKE 1 TABLET TWICE DAILY  . metFORMIN (GLUCOPHAGE) 1000 MG tablet TAKE ONE TABLET BY MOUTH TWICE A DAY WITH FOOD  . nitroGLYCERIN (NITROSTAT) 0.4 MG SL tablet Place 1 tablet (0.4 mg total) under the tongue every 5 (five) minutes as needed for chest pain.  Marland Kitchen REPATHA SURECLICK [id number] MG/ML SOAJ INJECT 140 MG INTO THE SKIN EVERY 14 (FOURTEEN) DAYS.  Marland Kitchen rivaroxaban (XARELTO) 20 MG TABS tablet Take 1 tablet (20 mg total) by mouth daily with supper.  . TRUEplus Lancets 33G MISC Use to check blood sugar 2 times a day.     Allergies:   Statins   Social History   Socioeconomic History  . Marital status: Married    Spouse name: Not on file  . Number of children: 3  . Years of education: Not on file  . Highest education level: Not on file  Occupational History  . Occupation: Marketing executive  work    Fish farm manager: [employer]. [employer].  Tobacco Use  . Smoking status: Former Smoker    Packs/day: 1.00    Years: 10.00    Pack years: 10.00    Types: Cigarettes    Quit date: 03/11/2012    Years since quitting: 8.0  . Smokeless tobacco: Never Used  Vaping Use  . Vaping Use: Never used  Substance and Sexual Activity  . Alcohol use: No  . Drug use: No  . Sexual activity: Yes    Partners: Male    Birth control/protection: Surgical  Other Topics Concern  . Not on file  Social History Narrative   Lives with husband and  mother in law.     Regular exercise: walking   Caffeine use: 2 cups of coffee in the morning   Social Determinants of Health   Financial Resource Strain: Not on file  Food Insecurity: Not on file  Transportation Needs: Not on file  Physical Activity: Not on file  Stress: Not on file  Social Connections: Not on file     Family History: The patient's family history includes Arthritis in an other family member; Colon cancer in her sister; Diabetes in her mother and another family member; Heart disease in her mother; Hyperlipidemia in an other family member; Kidney failure in her mother; Liver cancer in her brother; Ovarian cancer in an other family member. There is no history of Stomach cancer, Rectal cancer, Esophageal cancer, or Pancreatic cancer. ROS:   Please see the history of present illness.    All other systems are reviewed and are negative.   EKGs/Labs/Other Studies Reviewed:   Nuclear stress test 04/28/2019  The left ventricular ejection fraction is mildly decreased (45-54%).  Nuclear stress EF: 51%.  There was no ST segment deviation noted during stress.  No T wave inversion was noted during stress.  Defect 1: There is a small defect of mild severity present in the apex location.  Defect 2: There is a small defect of mild severity present in the basal inferolateral location.  This is a low risk study.  Findings consistent with prior  myocardial infarction.   Low risk nuclear scan with a small apical scar (previously described) and a possibly new small area of mild inferolateral ischemia (sum difference 4%) and borderline reduction in left ventricular systolic function (Q000111Q).   EKG:  EKG is ordered today shows atrial sensed, biventricular paced rhythm with a prominent positive R wave in lead V1  Recent Labs: 04/19/2019: ALT 38 09/17/2019: BUN 17; Creatinine, Ser 0.73; Hemoglobin 13.1; Platelets 241; Potassium 4.1; Sodium 136  Recent Lipid Panel    Component Value Date/Time   CHOL 100 04/19/2019 0854   TRIG 115 04/19/2019 0854   HDL 61 04/19/2019 0854   CHOLHDL 1.6 04/19/2019 0854   CHOLHDL 5.2 (H) 04/04/2016 0723   VLDL NOT CALC 04/04/2016 0723   LDLCALC 19 04/19/2019 0854   LDLDIRECT 50 04/04/2016 0723    Physical Exam:    VS:  BP (!) 114/58   Pulse 89   Ht 5' 0.5" (1.537 m)   Wt 151 lb 9.6 oz (68.8 kg)   SpO2 97%   BMI 29.12 kg/m     Wt Readings from Last 3 Encounters:  03/20/20 152 lb (68.9 kg)  03/20/20 151 lb 9.6 oz (68.8 kg)  12/28/19 147 lb (66.7 kg)     General: Alert, oriented x3, no distress, healthy left subclavian device site Head: no evidence of trauma, PERRL, EOMI, no exophtalmos or lid lag, no myxedema, no xanthelasma; normal ears, nose and oropharynx Neck: normal jugular venous pulsations and no hepatojugular reflux; brisk carotid pulses without delay and no carotid bruits Chest: clear to auscultation, no signs of consolidation by percussion or palpation, normal fremitus, symmetrical and full respiratory excursions Cardiovascular: normal position and quality of the apical impulse, regular rhythm, normal first and second heart sounds, no murmurs, rubs or gallops Abdomen: no tenderness or distention, no masses by palpation, no abnormal pulsatility or arterial bruits, normal bowel sounds, no hepatosplenomegaly Extremities: Healing wounds of scalding left lower extremity; no clubbing,  cyanosis or edema; 2+ radial, ulnar and brachial pulses bilaterally; 2+ right femoral, posterior  tibial and dorsalis pedis pulses; 2+ left femoral, posterior tibial and dorsalis pedis pulses; no subclavian or femoral bruits Neurological: grossly nonfocal Psych: Normal mood and affect    ASSESSMENT:    1. Coronary artery disease involving native coronary artery of native heart without angina pectoris   2. Chronic combined systolic and diastolic heart failure ([place])   3. Type 2 diabetes mellitus with other circulatory complication, without long-term current use of insulin (HCC)   4. Paroxysmal atrial fibrillation ([place])   5. Long term current use of anticoagulant   6. Mixed hyperlipidemia   7. Essential hypertension   8. Status post biventricular pacemaker   9. Preoperative cardiovascular examination    PLAN:    In order of problems listed above:  1. CAD s/p CABG: She has not had recent chest discomfort.  She had a low risk recent nuclear stress test.  Past episodes of abrupt onset and unpredictable chest discomfort appeared to improve following initiation of treatment with long-acting nitrates.  Not sure whether this is because we were treating coronary spasm or esophageal spasm.      2. CHF: Asymptomatic (NYHA functional class I) and clinically euvolemic.  She is a CRT-P hyper responder.   Prior to CRT-P LVEF was 35-40% and she had a lot of problems with heart failure exacerbation, but following resynchronization therapy EF was 50-55% and she has not had problems with heart failure in a very long time.  On ARB, carvedilol and SGLT2 inhibitor, very low-dose of loop diuretic. 3. DM: Hemoglobin A1c slightly above target range at 7.1% last October. 4. AFib: No recent episodes of true atrial fibrillation.  She does have occasional atrial tachycardia.  She did have a MAZE procedure at the time of bypass.   Continue anticoagulation.. CHADSVasc 82 (age, gender,  CHF, CAD, DM, HTN). 5. Xarelto:  Well-tolerated, no bleeding complications.  6. HLP: She has had an exceptional response to Repatha and was intolerant of multiple statins due to severe myalgias.  He is due for repeat lipid profile, which we will schedule when she undergoes her preop labs for her upcoming colonoscopy or hernia surgery. 7. HTN: Excellent control 8. CRT-P: Normal device function, continue remote downloads every 3 months 9. Preop cardiovascular exam: Despite her numerous medical problems, all the cardiac issues are currently well compensated.  I think she is at low risk for serious cardiovascular complications with colonoscopy or with the umbilical hernia surgery.  Obviously she will have to hold her anticoagulant for a couple of days before each procedure.  I assured her that the risk of ischemic/embolic complications is very low, especially since she has had an extremely low burden of atrial fibrillation recently.  We will send letters of communication to both Dr. Loletha Carrow and Dr. Donne Hazel.  Total time spent with patient: 48 minutes.  Patient Instructions  Medication Instructions:  No changes *If you need a refill on your cardiac medications before your next appointment, please call your pharmacy*   Lab Work: Your provider would like for you to have the following labs: Lipid  If you have labs (blood work) drawn today and your tests are completely normal, you will receive your results only by: Marland Kitchen MyChart Message (if you have MyChart) OR . A paper copy in the mail If you have any lab test that is abnormal or we need to change your treatment, we will call you to review the results.   Testing/Procedures: None ordered   Follow-Up: At [facility], you and your  health needs are our priority.  As part of our continuing mission to provide you with exceptional heart care, we have created designated Provider Care Teams.  These Care Teams include your primary Cardiologist (physician) and Advanced Practice Providers  (APPs -  Physician Assistants and Nurse Practitioners) who all work together to provide you with the care you need, when you need it.  We recommend signing up for the patient portal called "MyChart".  Sign up information is provided on this After Visit Summary.  MyChart is used to connect with patients for Virtual Visits (Telemedicine).  Patients are able to view lab/test results, encounter notes, upcoming appointments, etc.  Non-urgent messages can be sent to your provider as well.   To learn more about what you can do with MyChart, go to [email or web address].    Your next appointment:   3 month(s) on a pacer day  The format for your next appointment:   In Person  Provider:   [name], MD    . Medication Adjustments/Labs and Tests Ordered: Current medicines are reviewed at length with the patient today.  Concerns regarding medicines are outlined above.  Orders Placed This Encounter  Procedures  . Lipid panel  . EKG 12-Lead   No orders of the defined types were placed in this encounter.  Patient Instructions  Medication Instructions:  No changes *If you need a refill on your cardiac medications before your next appointment, please call your pharmacy*   Lab Work: Your provider would like for you to have the following labs: Lipid  If you have labs (blood work) drawn today and your tests are completely normal, you will receive your results only by: Marland Kitchen MyChart Message (if you have MyChart) OR . A paper copy in the mail If you have any lab test that is abnormal or we need to change your treatment, we will call you to review the results.   Testing/Procedures: None ordered   Follow-Up: At [facility], you and your health needs are our priority.  As part of our continuing mission to provide you with exceptional heart care, we have created designated Provider Care Teams.  These Care Teams include your primary Cardiologist (physician) and Advanced Practice Providers  (APPs -  Physician Assistants and Nurse Practitioners) who all work together to provide you with the care you need, when you need it.  We recommend signing up for the patient portal called "MyChart".  Sign up information is provided on this After Visit Summary.  MyChart is used to connect with patients for Virtual Visits (Telemedicine).  Patients are able to view lab/test results, encounter notes, upcoming appointments, etc.  Non-urgent messages can be sent to your provider as well.   To learn more about what you can do with MyChart, go to [email or web address].    Your next appointment:   3 month(s) on a pacer day  The format for your next appointment:   In Person  Provider:   [name], MD     Signed, [name], MD  03/20/2020 7:12 PM    [place]

## 2020-03-20 ENCOUNTER — Encounter: Payer: Self-pay | Admitting: Gastroenterology

## 2020-03-20 ENCOUNTER — Ambulatory Visit (INDEPENDENT_AMBULATORY_CARE_PROVIDER_SITE_OTHER): Payer: Medicare HMO | Admitting: Gastroenterology

## 2020-03-20 ENCOUNTER — Telehealth: Payer: Self-pay

## 2020-03-20 ENCOUNTER — Encounter: Payer: Self-pay | Admitting: Cardiovascular Disease

## 2020-03-20 ENCOUNTER — Other Ambulatory Visit: Payer: Self-pay

## 2020-03-20 ENCOUNTER — Ambulatory Visit (INDEPENDENT_AMBULATORY_CARE_PROVIDER_SITE_OTHER): Payer: Medicare HMO | Admitting: Cardiovascular Disease

## 2020-03-20 VITALS — BP 120/60 | HR 91 | Ht 60.5 in | Wt 152.0 lb

## 2020-03-20 VITALS — BP 114/58 | HR 89 | Ht 60.5 in | Wt 151.6 lb

## 2020-03-20 DIAGNOSIS — I480 Paroxysmal atrial fibrillation: Secondary | ICD-10-CM

## 2020-03-20 DIAGNOSIS — Z01810 Encounter for preprocedural cardiovascular examination: Secondary | ICD-10-CM

## 2020-03-20 DIAGNOSIS — Z7901 Long term (current) use of anticoagulants: Secondary | ICD-10-CM

## 2020-03-20 DIAGNOSIS — I5042 Chronic combined systolic (congestive) and diastolic (congestive) heart failure: Secondary | ICD-10-CM | POA: Diagnosis not present

## 2020-03-20 DIAGNOSIS — E782 Mixed hyperlipidemia: Secondary | ICD-10-CM | POA: Diagnosis not present

## 2020-03-20 DIAGNOSIS — I10 Essential (primary) hypertension: Secondary | ICD-10-CM | POA: Diagnosis not present

## 2020-03-20 DIAGNOSIS — Z951 Presence of aortocoronary bypass graft: Secondary | ICD-10-CM

## 2020-03-20 DIAGNOSIS — Z86010 Personal history of colonic polyps: Secondary | ICD-10-CM

## 2020-03-20 DIAGNOSIS — Z950 Presence of cardiac pacemaker: Secondary | ICD-10-CM | POA: Diagnosis not present

## 2020-03-20 DIAGNOSIS — E1159 Type 2 diabetes mellitus with other circulatory complications: Secondary | ICD-10-CM

## 2020-03-20 DIAGNOSIS — I2510 Atherosclerotic heart disease of native coronary artery without angina pectoris: Secondary | ICD-10-CM

## 2020-03-20 MED ORDER — PLENVU 140 G PO SOLR
140.0000 g | ORAL | 0 refills | Status: DC
Start: 2020-03-20 — End: 2020-07-31

## 2020-03-20 NOTE — Patient Instructions (Addendum)
If you are age 69 or older, your body mass index should be between 23-30. Your Body mass index is 29.2 kg/m. If this is out of the aforementioned range listed, please consider follow up with your Primary Care Provider.  If you are age 35 or younger, your body mass index should be between 19-25. Your Body mass index is 29.2 kg/m. If this is out of the aformentioned range listed, please consider follow up with your Primary Care Provider.   You have been scheduled for a colonoscopy. Please follow written instructions given to you at your visit today.  Please pick up your prep supplies at the pharmacy within the next 1-3 days. If you use inhalers (even only as needed), please bring them with you on the day of your procedure.  Due to recent changes in healthcare laws, you may see the results of your imaging and laboratory studies on MyChart before your provider has had a chance to review them.  We understand that in some cases there may be results that are confusing or concerning to you. Not all laboratory results come back in the same time frame and the provider may be waiting for multiple results in order to interpret others.  Please give [place] 48 hours in order for your provider to thoroughly review all the results before contacting the office for clarification of your results.   You will be contacted by our office prior to your procedure for directions on holding your XARELTO If you do not hear from our office 1 week prior to your scheduled procedure, please call [phone] to discuss.   _  _   ORAL DIABETIC MEDICATION INSTRUCTIONS  The day before your procedure:  Take your diabetic pill as you do normally  The day of your procedure:  Do not take your diabetic pill   We will check your blood sugar levels during the admission process and again in Recovery before discharging you home  ______________________________________________________________________                [name] _ OTHER NON-INSULIN  INJECTABLE MEDICATIONS         (Tanzeum, Trulicity, Byetta, Victoza, Bydureon, SymlinPen, Ozempic)  Hold the am of the procedure       It was a pleasure to see you today!  Dr. Loletha Carrow

## 2020-03-20 NOTE — Addendum Note (Signed)
Addended by: Elias Else on: 03/20/2020 03:39 PM   Modules accepted: Orders

## 2020-03-20 NOTE — Patient Instructions (Signed)
Medication Instructions:  No changes *If you need a refill on your cardiac medications before your next appointment, please call your pharmacy*   Lab Work: Your provider would like for you to have the following labs: Lipid  If you have labs (blood work) drawn today and your tests are completely normal, you will receive your results only by: Marland Kitchen MyChart Message (if you have MyChart) OR . A paper copy in the mail If you have any lab test that is abnormal or we need to change your treatment, we will call you to review the results.   Testing/Procedures: None ordered   Follow-Up: At [facility], you and your health needs are our priority.  As part of our continuing mission to provide you with exceptional heart care, we have created designated Provider Care Teams.  These Care Teams include your primary Cardiologist (physician) and Advanced Practice Providers (APPs -  Physician Assistants and Nurse Practitioners) who all work together to provide you with the care you need, when you need it.  We recommend signing up for the patient portal called "MyChart".  Sign up information is provided on this After Visit Summary.  MyChart is used to connect with patients for Virtual Visits (Telemedicine).  Patients are able to view lab/test results, encounter notes, upcoming appointments, etc.  Non-urgent messages can be sent to your provider as well.   To learn more about what you can do with MyChart, go to [email or web address].    Your next appointment:   3 month(s) on a pacer day  The format for your next appointment:   In Person  Provider:   [name], MD

## 2020-03-20 NOTE — Progress Notes (Signed)
[facility] Consult Note:  History: [name] 03/20/2020  Referring provider: Isaac [name], Michelle Halsted, MD  Reason for consult/chief complaint: Colon Cancer Screening (Patient is here for colon cancer screening, no sx or concerns.)   Subjective  HPI:  This is a very pleasant 70 year old woman referred for history of colon polyps.  This patient's last colonoscopy on file was in November 2012 with [name], where diverticulosis and 2 diminutive right colon tubular adenomas without high-grade dysplasia were removed.  Recommendation was repeat exam in 5 years.  [name]'s chart indicates a history of colon cancer in her sister, and it was the indication for the last colonoscopy.  However, she tells me today that she is uncertain if her sister truly had colon cancer, though she certainly had at least a partial colectomy for some indication.  [name] reports regular bowel habits and denies rectal bleeding.  She denies chronic upper digestive symptoms such as heartburn, dysphagia, nausea, early satiety or loss of appetite.  Her weight has been stable within about a 7 to 8 pound range. ________________________________________________    Cardiology follow-up office note from today was reviewed and includes the following:  "The patient specifically denies any chest pain at rest exertion, dyspnea at rest or with exertion, orthopnea, paroxysmal nocturnal dyspnea, syncope, palpitations, focal neurological deficits, intermittent claudication, lower extremity edema, unexplained weight gain, cough, hemoptysis or wheezing. She had a kitchen accident with scalding of her left leg, but it is healing well. Denies falls and bleeding.   Her pacemaker ([name] implanted 2014) is now < 3 months to ERI. All lead parameters are excellent. She has not had true atrial fibrillation since December 2020, has had occasional 4-8" episodes of nonsustained irregular atrial  tachycardia.   ECG is nondiagnostic due to biventricular pacing (she had an LBBB before).  Nuclear stress test in February 2021 showed very minor abnormalities: A previously described fixed apical defect, and a possible small area of basal inferolateral ischemia.  EF was 51%.  She has not had repeat cardiac catheterization since her bypass surgery in 2014.   She has good glycemic control with a hemoglobin A1c of 7.1% and an excellent lipid profile with an LDL cholesterol of 19 on Repatha.  ROS:  Review of Systems  Constitutional: Negative for appetite change and unexpected weight change.  HENT: Negative for mouth sores and voice change.   Eyes: Negative for pain and redness.  Respiratory: Negative for cough and shortness of breath.   Cardiovascular: Negative for chest pain and palpitations.  Genitourinary: Negative for dysuria and hematuria.  Musculoskeletal: Negative for arthralgias and myalgias.  Skin: Negative for pallor and rash.  Neurological: Negative for weakness and headaches.  Hematological: Negative for adenopathy.     Past Medical History: Past Medical History:  Diagnosis Date  . Acute on chronic combined systolic and diastolic CHF, NYHA class 3 ([place]) 03/19/2013  . AICD (automatic cardioverter/defibrillator) present 01/2013   Biventricular cardiac pacemaker in situ   . Allergic rhinitis   . Anogenital warts 01/22/2011  . Anxiety state, unspecified 05/13/2013  . BREAST CANCER, HX OF 09/19/2009   Qualifier: Diagnosis of  By: Michelle Mocha MD, [name]   . CAD (coronary artery disease)    a. 2004: s/p MI in [place]. No PCI->Medical RX;  b. 07/2012 Cath: LM 30-40, LAD 70p, 70/54m, D1 80-90p, OM1 small 90p, OM2 large 80-90p, 69m, 70-80d, RCA 20-30 diff, EF 40%, glob HK.s/p CABG  . Cancer of left breast ([place]) 2002  Patient reports left breast cancer diagnosis in 2002 treated with bilateral mastectomy positive lymph nodes with left axillary dissection followed by chemotherapy of unknown  type  . Cataract   . Chronic combined systolic and diastolic CHF, NYHA class 2 ([place]) 01/08/2013  . Diabetes mellitus type II   . Exertional dyspnea 08/05/2014  . Exertional shortness of breath   . Generalized osteoarthrosis, involving multiple sites 08/25/2015  . Hyperlipidemia   . Hypertension   . Incisional hernia, without obstruction or gangrene 05/04/2015  . Ischemic cardiomyopathy    a. 07/2012 Echo: EF 35%, Sev inferoseptal HK, mildly dil LA, Peak PASP 65mmHg.  Marland Kitchen LBBB (left bundle branch block)    a. intermittent - present during rapid afib 07/2012.  Marland Kitchen MYOCARDIAL INFARCTION, HX OF 09/19/2009   Qualifier: Diagnosis of  By: Michelle Mocha MD, [name]   . Neuromuscular disorder ([place])    Patient reports chronic numbness in the right foot related to previous surgery on the right leg and "nerve damage"  . PAF (paroxysmal atrial fibrillation) ([place])    a. 07/2012: Amio and xarelto initiated.  . Right carotid bruit 08/11/2012  . SBO (small bowel obstruction) ([place])   . SOB (shortness of breath) 02/26/2018  . Type 2 diabetes mellitus with circulatory disorder, without long-term current use of insulin ([place]) 10/03/2015  . Vitamin D deficiency 02/22/2016     Past Surgical History: Past Surgical History:  Procedure Laterality Date  . ABDOMINAL HYSTERECTOMY  2000  . APPENDECTOMY  1974  . BI-VENTRICULAR PACEMAKER INSERTION N/A 01/25/2013   Procedure: BI-VENTRICULAR PACEMAKER INSERTION (CRT-P);  Surgeon: Michelle Lance, MD;  Location[facility] CATH LAB;  Service: Cardiovascular;  Laterality: N/A;  . BREAST BIOPSY Left 2002  . CARDIAC CATHETERIZATION    . CHOLECYSTECTOMY OPEN  1974  . CORONARY ARTERY BYPASS GRAFT N/A 09/28/2012   Procedure: CORONARY ARTERY BYPASS GRAFTING (CABG);  Surgeon: Michelle Isaac, MD;  Location: [place];  Service: Open Heart Surgery;  Laterality: N/A;  CABG x four, using left internal mammary artery and left leg greater saphenous vein harvested endoscopically  . DILATION AND CURETTAGE OF  UTERUS  1983  . EPICARDIAL PACING LEAD PLACEMENT N/A 09/28/2012   Procedure: EPICARDIAL PACING LEAD PLACEMENT;  Surgeon: Michelle Isaac, MD;  Location: [facility];  Service: Thoracic;  Laterality: N/A;  LV LEAD PLACEMENT  . HERNIA REPAIR    . INCISIONAL HERNIA REPAIR N/A 05/25/2015   Procedure: LAPAROSCOPIC INCISIONAL HERNIA WITH MESH ;  Surgeon: [name], MD;  Location: [place];  Service: General;  Laterality: N/A;  . INCONTINENCE SURGERY  2000  . INSERTION OF MESH N/A 05/25/2015   Procedure: INSERTION OF MESH;  Surgeon: [name], MD;  Location: [facility];  Service: General;  Laterality: N/A;  . INTRAOPERATIVE TRANSESOPHAGEAL ECHOCARDIOGRAM N/A 09/28/2012   Procedure: INTRAOPERATIVE TRANSESOPHAGEAL ECHOCARDIOGRAM;  Surgeon: Michelle Isaac, MD;  Location: [facility];  Service: Open Heart Surgery;  Laterality: N/A;  . LAPAROSCOPIC INCISIONAL / UMBILICAL / VENTRAL HERNIA REPAIR  05/25/2015   IHR  . LEFT HEART CATHETERIZATION WITH CORONARY ANGIOGRAM N/A 07/20/2012   Procedure: LEFT HEART CATHETERIZATION WITH CORONARY ANGIOGRAM;  Surgeon: [name], MD;  Location: [facility] CATH LAB;  Service: Cardiovascular;  Laterality: N/A;  . MASTECTOMY Right 2002  . MASTECTOMY MODIFIED RADICAL W/ AXILLARY LYMPH NODES W/ OR W/O PECTORALIS MINOR Left 2002  . MAZE N/A 09/28/2012   Procedure: MAZE;  Surgeon: Michelle Isaac, MD;  Location: [facility];  Service: Open Heart Surgery;  Laterality: N/A;  . ORIF WRIST FRACTURE Right 11/08/2017   Procedure: OPEN REDUCTION INTERNAL FIXATION (ORIF) WRIST FRACTURE;  Surgeon: [name], MD;  Location: [facility];  Service: Orthopedics;  Laterality: Right;  90 mins  . TENDON REPAIR Right 2001 X 3-4   torn ligaments and tendons in ankle up to knee from work related accident  . TUBAL LIGATION  1987     Family History: Family History  Problem Relation Age of Onset  . Kidney failure Mother   . Heart disease Mother        Died mid 0000000 complications of diabetes  . Diabetes  Mother   . Colon cancer Sister   . Arthritis Other   . Diabetes Other   . Hyperlipidemia Other   . Ovarian cancer Other   . Liver cancer Brother   . Stomach cancer Neg Hx   . Rectal cancer Neg Hx   . Esophageal cancer Neg Hx   . Pancreatic cancer Neg Hx     Social History: Social History   Socioeconomic History  . Marital status: Married    Spouse name: Not on file  . Number of children: 3  . Years of education: Not on file  . Highest education level: Not on file  Occupational History  . Occupation: Marketing executive work    Fish farm manager: [employer]. [employer].  Tobacco Use  . Smoking status: Former Smoker    Packs/day: 1.00    Years: 10.00    Pack years: 10.00    Types: Cigarettes    Quit date: 03/11/2012    Years since quitting: 8.0  . Smokeless tobacco: Never Used  Vaping Use  . Vaping Use: Never used  Substance and Sexual Activity  . Alcohol use: No  . Drug use: No  . Sexual activity: Yes    Partners: Male    Birth control/protection: Surgical  Other Topics Concern  . Not on file  Social History Narrative   Lives with husband and mother in law.     Regular exercise: walking   Caffeine use: 2 cups of coffee in the morning   Social Determinants of Health   Financial Resource Strain: Not on file  Food Insecurity: Not on file  Transportation Needs: Not on file  Physical Activity: Not on file  Stress: Not on file  Social Connections: Not on file    Allergies: Allergies  Allergen Reactions  . Statins Other (See Comments)    Myalgias with rosuvastatin, atorvastatin and simvastatin     Outpatient Meds: Current Outpatient Medications  Medication Sig Dispense Refill  . APPLE CIDER VINEGAR PO Take 1 tablet by mouth in the morning, at noon, and at bedtime.    . Blood Glucose Monitoring Suppl (TRUE METRIX AIR GLUCOSE METER) DEVI Check blood sugar 2 times a day 1 each 0  . carvedilol (COREG) 12.5 MG tablet Take 1 tablet (12.5 mg total) by mouth 2 (two) times daily with a  meal. 180 tablet 3  . Dulaglutide (TRULICITY) 1.5 0000000 SOPN INJECT 1.5MG  (1 PEN) SUBCUTANEOUSLY EVERY WEEK 18 mL 1  . empagliflozin (JARDIANCE) 25 MG TABS tablet Take 1 tablet (25 mg total) by mouth daily before breakfast. 90 tablet 3  . fluticasone (FLONASE) 50 MCG/ACT nasal spray Place 2 sprays into both nostrils daily. 9.9 g 0  . furosemide (LASIX) 20 MG tablet TAKE 1 TABLET EVERY DAY 90 tablet 3  . glipiZIDE (GLUCOTROL) 5 MG tablet TAKE ONE TABLET BY MOUTH TWICE A DAY BEFORE A MEAL  200 tablet 4  . glucose blood (TRUE METRIX BLOOD GLUCOSE TEST) test strip Use to check blood sugar 2 times a day. 200 each 12  . isosorbide mononitrate (IMDUR) 60 MG 24 hr tablet TAKE 1 TABLET EVERY DAY 90 tablet 1  . losartan (COZAAR) 25 MG tablet TAKE 1 TABLET TWICE DAILY 180 tablet 3  . metFORMIN (GLUCOPHAGE) 1000 MG tablet TAKE ONE TABLET BY MOUTH TWICE A DAY WITH FOOD 200 tablet 4  . nitroGLYCERIN (NITROSTAT) 0.4 MG SL tablet Place 1 tablet (0.4 mg total) under the tongue every 5 (five) minutes as needed for chest pain. 25 tablet 3  . REPATHA SURECLICK [id number] MG/ML SOAJ INJECT 140 MG INTO THE SKIN EVERY 14 (FOURTEEN) DAYS. 2 mL 2  . rivaroxaban (XARELTO) 20 MG TABS tablet Take 1 tablet (20 mg total) by mouth daily with supper. 90 tablet 3  . TRUEplus Lancets 33G MISC Use to check blood sugar 2 times a day. 200 each 12   No current facility-administered medications for this visit.      ___________________________________________________________________ Objective   Exam:  BP 120/60   Pulse 91   Ht 5' 0.5" (1.537 m)   Wt 152 lb (68.9 kg)   SpO2 96%   BMI 29.20 kg/m  Wt Readings from Last 3 Encounters:  03/20/20 152 lb (68.9 kg)  03/20/20 151 lb 9.6 oz (68.8 kg)  12/28/19 147 lb (66.7 kg)     General: Well-appearing.  Eyes: sclera anicteric, no redness  ENT: oral mucosa moist without lesions, no cervical or supraclavicular lymphadenopathy  CV: RRR without murmur, S1/S2, no JVD, no  peripheral edema  Resp: clear to auscultation bilaterally, normal RR and effort noted  GI: soft, no tenderness, with active bowel sounds. No guarding or palpable organomegaly noted.  Poor muscle tone mid to lower abdominal wall from prior transflap reconstruction  Skin; warm and dry, no rash or jaundice noted  Neuro: awake, alert and oriented x 3. Normal gross motor function and fluent speech  Labs:  CBC Latest Ref Rng & Units 09/17/2019 07/21/2019 04/19/2019  WBC 4.0 - 10.5 K/uL 6.9 6.6 5.5  Hemoglobin 12.0 - 15.0 g/dL 13.1 13.1 13.2  Hematocrit 36.0 - 46.0 % 39.3 40.5 39.4  Platelets 150 - 400 K/uL 241 230 215     Other:  Nuclear stress test 04/28/2019  The left ventricular ejection fraction is mildly decreased (45-54%).  Nuclear stress EF: 51%.  There was no ST segment deviation noted during stress.  No T wave inversion was noted during stress.  Defect 1: There is a small defect of mild severity present in the apex location.  Defect 2: There is a small defect of mild severity present in the basal inferolateral location.  This is a low risk study.  Findings consistent with prior myocardial infarction.   Low risk nuclear scan with a small apical scar (previously described) and a possibly new small area of mild inferolateral ischemia (sum difference 4%) and borderline reduction in left ventricular systolic function ([id number]). ___________________________________   CHADSVasc 21 (age, gender, CHF, CAD, DM, HTN).    Assessment: Encounter Diagnoses  Name Primary?  . Personal history of colonic polyps Yes  . Current use of long term anticoagulation   . S/P CABG x 4: 09/28/12 (LIMA-LAD, SVG-OM1-OM2, SVG-Intermediate)   . PAF (paroxysmal atrial fibrillation) (HCC)     History of colon polyps, overdue for surveillance colonoscopy.  She was agreeable after discussion of procedure and risks.  The benefits and risks  of the planned procedure were described in detail with the  patient or (when appropriate) their health care proxy.  Risks were outlined as including, but not limited to, bleeding, infection, perforation, adverse medication reaction leading to cardiac or pulmonary decompensation, pancreatitis (if ERCP).  The limitation of incomplete mucosal visualization was also discussed.  No guarantees or warranties were given. Patient at increased risk for cardiopulmonary complications of procedure due to medical comorbidities.   Will need to hold [place] 2 days prior to procedure, and she understands the small but real risk of stroke during that time.  Cardiac condition has been stable as noted above, I expect her cardiologist will be agreeable to this.   Thank you for the courtesy of this consult.  Please call me with any questions or concerns.  Nelida Meuse III  CC: Referring provider noted above And  [name], MD

## 2020-03-20 NOTE — Telephone Encounter (Signed)
[facility] Pre-operative Risk Assessment     Request for surgical clearance:     Endoscopy Procedure  What type of surgery is being performed?     Colonoscopy  When is this surgery scheduled?     04-14-2020  What type of clearance is required ?   Pharmacy  Are there any medications that need to be held prior to surgery and how long? Yes, Xarelto, 2 days  Practice name and name of physician performing surgery?      [facility]  What is your office phone and fax number?      Phone- [phone]  [phone]  Anesthesia type (None, local, MAC, general) ?       MAC

## 2020-03-21 NOTE — Telephone Encounter (Signed)
Patient with diagnosis of afib on Xarelto for anticoagulation.    Procedure: Colonoscopy Date of procedure: 04/14/20    CHA2DS2-VASc Score = 6  This indicates a 9.7% annual risk of stroke. The patient's score is based upon: CHF History: Yes HTN History: Yes Diabetes History: Yes Stroke History: No Vascular Disease History: Yes Age Score: 1 Gender Score: 1     CrCl 64.6 ml/min  Per office protocol, patient can hold Xarelto for 2 days prior to procedure.

## 2020-03-21 NOTE — Telephone Encounter (Signed)
Patient has been notified and aware. No additional questions or concerns at this time

## 2020-03-21 NOTE — Telephone Encounter (Signed)
   Primary Cardiologist: [name], MD  Chart reviewed as part of pre-operative protocol coverage. Patient was contacted 03/21/2020 in reference to pre-operative risk assessment for pending surgery as outlined below.  Patient was seen yesterday and cleared by Dr. Sallyanne Kuster, "despite her numerous medical problems, all the cardiac issues are currently well compensated.  I think she is at low risk for serious cardiovascular complications with colonoscopy or with the umbilical hernia surgery.  Obviously she will have to hold her anticoagulant for a couple of days before each procedure.  I assured her that the risk of ischemic/embolic complications is very low, especially since she has had an extremely low burden of atrial fibrillation recently.  We will send letters of communication to both Dr. Loletha Carrow and Dr. Donne Hazel."  Per our clinical pharmacist, patient will need to hold Xarelto for 2 days prior to the procedure and restart as soon as possible after the procedure.  Therefore, based on ACC/AHA guidelines, the patient would be at acceptable risk for the planned procedure without further cardiovascular testing.   I will route this recommendation to the requesting party via Epic fax function and remove from pre-op pool. Please call with questions.  [place], [place] 03/21/2020, 10:53 AM

## 2020-04-07 ENCOUNTER — Telehealth: Payer: Self-pay | Admitting: Internal Medicine

## 2020-04-07 NOTE — Telephone Encounter (Signed)
Pt is calling to see it she can get a call back about a personal matter.

## 2020-04-07 NOTE — Telephone Encounter (Signed)
Spoke with patient and explained it was office policy to have a virtual visit with a provider if the patient has any Covid symptoms.

## 2020-04-10 ENCOUNTER — Emergency Department (HOSPITAL_COMMUNITY)
Admission: EM | Admit: 2020-04-10 | Discharge: 2020-04-11 | Disposition: A | Discharge status: Home / Self Care | Payer: Medicare HMO | Attending: Emergency Medicine | Admitting: Emergency Medicine

## 2020-04-10 ENCOUNTER — Ambulatory Visit (INDEPENDENT_AMBULATORY_CARE_PROVIDER_SITE_OTHER): Payer: Medicare HMO

## 2020-04-10 ENCOUNTER — Other Ambulatory Visit: Payer: Self-pay

## 2020-04-10 DIAGNOSIS — R1031 Right lower quadrant pain: Secondary | ICD-10-CM | POA: Diagnosis not present

## 2020-04-10 DIAGNOSIS — R109 Unspecified abdominal pain: Secondary | ICD-10-CM

## 2020-04-10 DIAGNOSIS — R1013 Epigastric pain: Secondary | ICD-10-CM | POA: Diagnosis not present

## 2020-04-10 DIAGNOSIS — I110 Hypertensive heart disease with heart failure: Secondary | ICD-10-CM | POA: Insufficient documentation

## 2020-04-10 DIAGNOSIS — E119 Type 2 diabetes mellitus without complications: Secondary | ICD-10-CM | POA: Diagnosis not present

## 2020-04-10 DIAGNOSIS — I5042 Chronic combined systolic (congestive) and diastolic (congestive) heart failure: Secondary | ICD-10-CM | POA: Diagnosis not present

## 2020-04-10 DIAGNOSIS — Z853 Personal history of malignant neoplasm of breast: Secondary | ICD-10-CM | POA: Diagnosis not present

## 2020-04-10 DIAGNOSIS — M47816 Spondylosis without myelopathy or radiculopathy, lumbar region: Secondary | ICD-10-CM | POA: Diagnosis not present

## 2020-04-10 DIAGNOSIS — Z951 Presence of aortocoronary bypass graft: Secondary | ICD-10-CM | POA: Diagnosis not present

## 2020-04-10 DIAGNOSIS — K760 Fatty (change of) liver, not elsewhere classified: Secondary | ICD-10-CM | POA: Diagnosis not present

## 2020-04-10 DIAGNOSIS — I2510 Atherosclerotic heart disease of native coronary artery without angina pectoris: Secondary | ICD-10-CM | POA: Insufficient documentation

## 2020-04-10 DIAGNOSIS — K439 Ventral hernia without obstruction or gangrene: Secondary | ICD-10-CM | POA: Diagnosis not present

## 2020-04-10 DIAGNOSIS — Z87891 Personal history of nicotine dependence: Secondary | ICD-10-CM | POA: Insufficient documentation

## 2020-04-10 DIAGNOSIS — I10 Essential (primary) hypertension: Secondary | ICD-10-CM | POA: Diagnosis not present

## 2020-04-10 DIAGNOSIS — I255 Ischemic cardiomyopathy: Secondary | ICD-10-CM

## 2020-04-10 DIAGNOSIS — I700 Atherosclerosis of aorta: Secondary | ICD-10-CM | POA: Diagnosis not present

## 2020-04-10 DIAGNOSIS — Z90711 Acquired absence of uterus with remaining cervical stump: Secondary | ICD-10-CM | POA: Diagnosis not present

## 2020-04-10 LAB — CUP PACEART REMOTE DEVICE CHECK
Battery Remaining Longevity: 1 mo
Battery Remaining Percentage: 0.5 %
Battery Voltage: 2.62 V
Brady Statistic AP VP Percent: 1 %
Brady Statistic AP VS Percent: 1 %
Brady Statistic AS VP Percent: 99 %
Brady Statistic AS VS Percent: 1 %
Brady Statistic RA Percent Paced: 1 %
Date Time Interrogation Session: 20220131020014
Implantable Lead Implant Date: 20140721
Implantable Lead Implant Date: 20141117
Implantable Lead Implant Date: 20141117
Implantable Lead Location: 753858
Implantable Lead Location: 753859
Implantable Lead Location: 753860
Implantable Lead Model: 5071
Implantable Pulse Generator Implant Date: 20141117
Lead Channel Impedance Value: 400 Ohm
Lead Channel Impedance Value: 400 Ohm
Lead Channel Impedance Value: 540 Ohm
Lead Channel Pacing Threshold Amplitude: 0.5 V
Lead Channel Pacing Threshold Amplitude: 0.75 V
Lead Channel Pacing Threshold Amplitude: 1.25 V
Lead Channel Pacing Threshold Pulse Width: 0.4 ms
Lead Channel Pacing Threshold Pulse Width: 0.4 ms
Lead Channel Pacing Threshold Pulse Width: 0.7 ms
Lead Channel Sensing Intrinsic Amplitude: 0.6 mV
Lead Channel Sensing Intrinsic Amplitude: 12 mV
Lead Channel Setting Pacing Amplitude: 2 V
Lead Channel Setting Pacing Amplitude: 2.5 V
Lead Channel Setting Pacing Amplitude: 2.5 V
Lead Channel Setting Pacing Pulse Width: 0.4 ms
Lead Channel Setting Pacing Pulse Width: 0.7 ms
Lead Channel Setting Sensing Sensitivity: 2 mV
Pulse Gen Model: 3222
Pulse Gen Serial Number: 2981305

## 2020-04-10 LAB — COMPREHENSIVE METABOLIC PANEL
ALT: 27 U/L (ref 0–44)
AST: 23 U/L (ref 15–41)
Albumin: 4.1 g/dL (ref 3.5–5.0)
Alkaline Phosphatase: 46 U/L (ref 38–126)
Anion gap: 13 (ref 5–15)
BUN: 14 mg/dL (ref 8–23)
CO2: 26 mmol/L (ref 22–32)
Calcium: 9.4 mg/dL (ref 8.9–10.3)
Chloride: 99 mmol/L (ref 98–111)
Creatinine, Ser: 0.73 mg/dL (ref 0.44–1.00)
GFR, Estimated: >60 mL/min (ref >60)
Glucose, Bld: 128 mg/dL — ABNORMAL HIGH (ref 70–99)
Potassium: 3.8 mmol/L (ref 3.5–5.1)
Sodium: 138 mmol/L (ref 135–145)
Total Bilirubin: 0.8 mg/dL (ref 0.3–1.2)
Total Protein: 7.2 g/dL (ref 6.5–8.1)

## 2020-04-10 LAB — CBC
HCT: 43.7 % (ref 36.0–46.0)
Hemoglobin: 13.8 g/dL (ref 12.0–15.0)
MCH: 30.3 pg (ref 26.0–34.0)
MCHC: 31.6 g/dL (ref 30.0–36.0)
MCV: 96 fL (ref 80.0–100.0)
Platelets: 252 10*3/uL (ref 150–400)
RBC: 4.55 MIL/uL (ref 3.87–5.11)
RDW: 12.2 % (ref 11.5–15.5)
WBC: 7.7 10*3/uL (ref 4.0–10.5)
nRBC: 0 % (ref 0.0–0.2)

## 2020-04-10 LAB — LIPASE, BLOOD: Lipase: 52 U/L — ABNORMAL HIGH (ref 11–51)

## 2020-04-10 NOTE — ED Triage Notes (Signed)
Pt presents with worsening pain from hernia, for which she is being followed by Dr. Donne Hazel for possible surgery in February. Pt experiencing n/v over the last day with increased pain and was advised by nurse at [facility] to come to ED.

## 2020-04-11 ENCOUNTER — Encounter: Payer: Self-pay | Admitting: *Deleted

## 2020-04-11 ENCOUNTER — Telehealth: Payer: Self-pay | Admitting: Gastroenterology

## 2020-04-11 ENCOUNTER — Emergency Department (HOSPITAL_COMMUNITY): Payer: Medicare HMO

## 2020-04-11 ENCOUNTER — Telehealth: Payer: Self-pay | Admitting: Emergency Medicine

## 2020-04-11 DIAGNOSIS — K439 Ventral hernia without obstruction or gangrene: Secondary | ICD-10-CM | POA: Diagnosis not present

## 2020-04-11 DIAGNOSIS — I700 Atherosclerosis of aorta: Secondary | ICD-10-CM | POA: Diagnosis not present

## 2020-04-11 DIAGNOSIS — K760 Fatty (change of) liver, not elsewhere classified: Secondary | ICD-10-CM | POA: Diagnosis not present

## 2020-04-11 DIAGNOSIS — M47816 Spondylosis without myelopathy or radiculopathy, lumbar region: Secondary | ICD-10-CM | POA: Diagnosis not present

## 2020-04-11 LAB — URINALYSIS, ROUTINE W REFLEX MICROSCOPIC
Bacteria, UA: NONE SEEN
Bilirubin Urine: NEGATIVE
Glucose, UA: >=500 mg/dL — AB
Hgb urine dipstick: NEGATIVE
Ketones, ur: NEGATIVE mg/dL
Nitrite: NEGATIVE
Protein, ur: NEGATIVE mg/dL
Specific Gravity, Urine: 1.029 (ref 1.005–1.030)
pH: 6 (ref 5.0–8.0)

## 2020-04-11 MED ORDER — OXYCODONE HCL 5 MG PO TABS: 5.0000 mg | ORAL_TABLET | ORAL | 0 refills | Status: DC | PRN

## 2020-04-11 MED ORDER — HYDROMORPHONE HCL 1 MG/ML IJ SOLN
0.5000 mg | Freq: Once | INTRAMUSCULAR | Status: AC
  Administered 2020-04-11: 0.5 mg via INTRAVENOUS
  Filled 2020-04-11: qty 1

## 2020-04-11 MED ORDER — ONDANSETRON HCL 4 MG/2ML IJ SOLN
4.0000 mg | Freq: Once | INTRAMUSCULAR | Status: AC
  Administered 2020-04-11: 4 mg via INTRAVENOUS
  Filled 2020-04-11: qty 2

## 2020-04-11 MED ORDER — SODIUM CHLORIDE 0.9 % IV BOLUS
1000.0000 mL | Freq: Once | INTRAVENOUS | Status: AC
  Administered 2020-04-11: 1000 mL via INTRAVENOUS

## 2020-04-11 MED ORDER — IOHEXOL 300 MG/ML  SOLN
100.0000 mL | Freq: Once | INTRAMUSCULAR | Status: AC | PRN
  Administered 2020-04-11: 100 mL via INTRAVENOUS

## 2020-04-11 NOTE — ED Notes (Signed)
Patient verbalizes understanding of discharge instructions. Opportunity for questioning and answers were provided. Armband removed by staff, pt discharged from ED via wheelchair.  

## 2020-04-11 NOTE — ED Notes (Signed)
Pt pacemaker interrogated.  

## 2020-04-11 NOTE — Telephone Encounter (Signed)
No rush to replace the device. We have 3 months to get that done. Has she already scheduled the hernia repair? We could tentatively schedule for afternoon of Feb 7, 14 or 28 if that fits her schedule. Bu also aOK to wait until next month.

## 2020-04-11 NOTE — Telephone Encounter (Signed)
Patient called and notified of instructions. Instructions sent to MyChart as well.     [facility] [place], [place]  [place], [place] [address]  Phone: [phone] Fax: [phone]    Generator Change Procedure Instructions  You are scheduled for a Generator Change (battery change) on  04/17/20  with Dr. Sallyanne Kuster.  1. Please arrive at the [facility], Entrance "A"  at [facility] at 11:30 am on the day of your procedure. (The address is [address])  2. DIET: You may have a light, early breakfast the morning of your procedure. NOTHING TO EAT AFTER 8:00 AM.  3. LABS:  You will need to have the coronavirus test completed prior to your procedure. An appointment has been made at 8:55 am on 04/13/20. This is a Drive Up Visit at [address], [place], [place] [address]. Please tell them that you are there for procedure testing. Stay in your car and someone will be with you shortly. Please make sure to have all other labs completed before this test because you will need to stay quarantined until your procedure.   4. MEDICATIONS:  Hold Xarelto for 24 hours prior to the procedure (hold on 04/16/20) Hold the Glipizide the morning of the procedure.   5.  Plan for an overnight stay.  Bring your insurance cards and a list of you medications.  6.  Wash your chest and neck with surgical scrub the evening before and the morning of your procedure.  Rinse well. Please review the surgical scrub instruction sheet given to you.   7. Your chest will need to be shaved prior to this procedure (if needed). We ask that you do this yourself at home 1 to 2 days before or if uncomfortable/unable to do yourself, then it will be performed by the hospital staff the day of.  * Special note:  Every effort is made to have your procedure done on time.  Occasionally there are emergencies that present themselves at the hospital that may cause delays.   Please be patient if a delay does occur.                                                                                                           * If you have any questions after you get home, please call Lattie Haw, RN at [phone].    [facility] - Preparing For Surgery  Before surgery, you can play an important role. Because skin is not sterile, your skin needs to be as free of germs as possible. You can reduce the number of germs on your skin by washing with CHG (chlorahexidine gluconate) Soap before surgery.  CHG is an antiseptic cleaner which kills germs and bonds with the skin to continue killing germs even after washing.   Please do not use if you have an allergy to CHG or antibacterial soaps.  If your skin becomes reddened/irritated stop using the CHG.   Do not shave (including legs and underarms) for at least  48 hours prior to first CHG shower.  It is OK to shave your face.  Please follow these instructions carefully:  1.  Shower the night before surgery and the morning of surgery with CHG.  2.  If you choose to wash your hair, wash your hair first as usual with your normal shampoo.  3.  After you shampoo, rinse your hair and body thoroughly to remove the shampoo.  4.  Use CHG as you would any other liquid soap.  You can apply CHG directly to the skin and wash gently with a clean washcloth. 5.  Apply the CHG Soap to your body ONLY FROM THE NECK DOWN.  Do not use on open wounds or open sores.  Avoid contact with your eyes, ears, mouth and genitals (private parts).    6.  Wash thoroughly, paying special attention to the area where your surgery will be performed.  7.  Thoroughly rinse your body with warm water from the neck down.   8.  DO NOT shower/wash with your normal soap after using and rinsing off the CHG soap.  9.  Pat yourself dry with a clean towel.   10.  Wear clean pajamas.   11.  Place clean sheets on your bed the night of your first shower and do not sleep with  pets.  Day of Surgery: Do not apply any deodorants/lotions.  Please wear clean clothes to the hospital/surgery center.

## 2020-04-11 NOTE — ED Notes (Signed)
Dr. Sedonia Small on phone with [facility] Jude

## 2020-04-11 NOTE — ED Provider Notes (Signed)
[facility] Emergency Department Provider Note MRN:  [id number]  Arrival date & time: 04/11/20     Chief Complaint   Abdominal pain History of Present Illness   [name] is a 70 y.o. year-old female with a history of CAD, breast cancer, diabetes, hernia presenting to the ED with chief complaint of abdominal pain.  Location: Right lower quadrant Duration: 2 to 3 days Onset: Gradual Timing: Constant, much worse today Description: Sharp Severity: Severe Exacerbating/Alleviating Factors: None Associated Symptoms: Nausea and vomiting Pertinent Negatives: Denies any constipation, no chest pain or shortness of breath, fever.  Eating and drinking normally today.   Review of Systems  A complete 10 system review of systems was obtained and all systems are negative except as noted in the HPI and PMH.   Patient's Health History    Past Medical History:  Diagnosis Date  . Acute on chronic combined systolic and diastolic CHF, NYHA class 3 ([place]) 03/19/2013  . AICD (automatic cardioverter/defibrillator) present 01/2013   Biventricular cardiac pacemaker in situ   . Allergic rhinitis   . Anogenital warts 01/22/2011  . Anxiety state, unspecified 05/13/2013  . BREAST CANCER, HX OF 09/19/2009   Qualifier: Diagnosis of  By: [name], Jory Ee   . CAD (coronary artery disease)    a. 2004: s/p MI in [place]. No PCI->Medical RX;  b. 07/2012 Cath: LM 30-40, LAD 70p, 70/82m, D1 80-90p, OM1 small 90p, OM2 large 80-90p, 23m, 70-80d, RCA 20-30 diff, EF 40%, glob HK.s/p CABG  . Cancer of left breast [facility]) 2002   Patient reports left breast cancer diagnosis in 2002 treated with bilateral mastectomy positive lymph nodes with left axillary dissection followed by chemotherapy of unknown type  . Cataract   . Chronic combined systolic and diastolic CHF, NYHA class 2 ([place]) 01/08/2013  . Diabetes mellitus type II   . Exertional dyspnea 08/05/2014  . Exertional shortness of breath   .  Generalized osteoarthrosis, involving multiple sites 08/25/2015  . Hyperlipidemia   . Hypertension   . Incisional hernia, without obstruction or gangrene 05/04/2015  . Ischemic cardiomyopathy    a. 07/2012 Echo: EF 35%, Sev inferoseptal HK, mildly dil LA, Peak PASP 78mmHg.  Marland Kitchen LBBB (left bundle branch block)    a. intermittent - present during rapid afib 07/2012.  Marland Kitchen MYOCARDIAL INFARCTION, HX OF 09/19/2009   Qualifier: Diagnosis of  By: [name], Jory Ee   . Neuromuscular disorder ([place])    Patient reports chronic numbness in the right foot related to previous surgery on the right leg and "nerve damage"  . PAF (paroxysmal atrial fibrillation) ([place])    a. 07/2012: Amio and xarelto initiated.  . Right carotid bruit 08/11/2012  . SBO (small bowel obstruction) ([place])   . SOB (shortness of breath) 02/26/2018  . Type 2 diabetes mellitus with circulatory disorder, without long-term current use of insulin ([place]) 10/03/2015  . Vitamin D deficiency 02/22/2016    Past Surgical History:  Procedure Laterality Date  . ABDOMINAL HYSTERECTOMY  2000  . APPENDECTOMY  1974  . BI-VENTRICULAR PACEMAKER INSERTION N/A 01/25/2013   Procedure: BI-VENTRICULAR PACEMAKER INSERTION (CRT-P);  Surgeon: Evans Lance, MD;  Location: [facility] CATH LAB;  Service: Cardiovascular;  Laterality: N/A;  . BREAST BIOPSY Left 2002  . CARDIAC CATHETERIZATION    . CHOLECYSTECTOMY OPEN  1974  . CORONARY ARTERY BYPASS GRAFT N/A 09/28/2012   Procedure: CORONARY ARTERY BYPASS GRAFTING (CABG);  Surgeon: [name], MD;  Location: [facility];  Service:  Open Heart Surgery;  Laterality: N/A;  CABG x four, using left internal mammary artery and left leg greater saphenous vein harvested endoscopically  . DILATION AND CURETTAGE OF UTERUS  1983  . EPICARDIAL PACING LEAD PLACEMENT N/A 09/28/2012   Procedure: EPICARDIAL PACING LEAD PLACEMENT;  Surgeon: [name], MD;  Location: [facility];  Service: Thoracic;  Laterality: N/A;  LV LEAD PLACEMENT  .  HERNIA REPAIR    . INCISIONAL HERNIA REPAIR N/A 05/25/2015   Procedure: LAPAROSCOPIC INCISIONAL HERNIA WITH MESH ;  Surgeon: [name], MD;  Location: [facility];  Service: General;  Laterality: N/A;  . INCONTINENCE SURGERY  2000  . INSERTION OF MESH N/A 05/25/2015   Procedure: INSERTION OF MESH;  Surgeon: [name], MD;  Location: [facility];  Service: General;  Laterality: N/A;  . INTRAOPERATIVE TRANSESOPHAGEAL ECHOCARDIOGRAM N/A 09/28/2012   Procedure: INTRAOPERATIVE TRANSESOPHAGEAL ECHOCARDIOGRAM;  Surgeon: [name], MD;  Location: [facility];  Service: Open Heart Surgery;  Laterality: N/A;  . LAPAROSCOPIC INCISIONAL / UMBILICAL / VENTRAL HERNIA REPAIR  05/25/2015   IHR  . LEFT HEART CATHETERIZATION WITH CORONARY ANGIOGRAM N/A 07/20/2012   Procedure: LEFT HEART CATHETERIZATION WITH CORONARY ANGIOGRAM;  Surgeon: [name], MD;  Location: [facility] CATH LAB;  Service: Cardiovascular;  Laterality: N/A;  . MASTECTOMY Right 2002  . MASTECTOMY MODIFIED RADICAL W/ AXILLARY LYMPH NODES W/ OR W/O PECTORALIS MINOR Left 2002  . MAZE N/A 09/28/2012   Procedure: MAZE;  Surgeon: [name], MD;  Location: [facility];  Service: Open Heart Surgery;  Laterality: N/A;  . ORIF WRIST FRACTURE Right 11/08/2017   Procedure: OPEN REDUCTION INTERNAL FIXATION (ORIF) WRIST FRACTURE;  Surgeon: [name], MD;  Location: [facility];  Service: Orthopedics;  Laterality: Right;  90 mins  . TENDON REPAIR Right 2001 X 3-4   torn ligaments and tendons in ankle up to knee from work related accident  . TUBAL LIGATION  1987    Family History  Problem Relation Age of Onset  . Kidney failure Mother   . Heart disease Mother        Died mid 21J complications of diabetes  . Diabetes Mother   . Colon cancer Sister   . Arthritis Other   . Diabetes Other   . Hyperlipidemia Other   . Ovarian cancer Other   . Liver cancer Brother   . Stomach cancer Neg Hx   . Rectal cancer Neg Hx   . Esophageal cancer Neg Hx   .  Pancreatic cancer Neg Hx     Social History   Socioeconomic History  . Marital status: Married    Spouse name: Not on file  . Number of children: 3  . Years of education: Not on file  . Highest education level: Not on file  Occupational History  . Occupation: Marketing executive work    Fish farm manager: [employer]. [employer].  Tobacco Use  . Smoking status: Former Smoker    Packs/day: 1.00    Years: 10.00    Pack years: 10.00    Types: Cigarettes    Quit date: 03/11/2012    Years since quitting: 8.0  . Smokeless tobacco: Never Used  Vaping Use  . Vaping Use: Never used  Substance and Sexual Activity  . Alcohol use: No  . Drug use: No  . Sexual activity: Yes    Partners: Male    Birth control/protection: Surgical  Other Topics Concern  . Not on file  Social History Narrative   Lives with  husband and mother in law.     Regular exercise: walking   Caffeine use: 2 cups of coffee in the morning   Social Determinants of Health   Financial Resource Strain: Not on file  Food Insecurity: Not on file  Transportation Needs: Not on file  Physical Activity: Not on file  Stress: Not on file  Social Connections: Not on file  Intimate Partner Violence: Not on file     Physical Exam   Vitals:   04/11/20 0330 04/11/20 0400  BP: 135/74 132/67  Pulse: 87 92  Resp: (!) 25 16  Temp:    SpO2: 98% 93%    CONSTITUTIONAL: Well-appearing, NAD NEURO:  Alert and oriented x 3, no focal deficits EYES:  eyes equal and reactive ENT/NECK:  no LAD, no JVD CARDIO: Regular rate, well-perfused, normal S1 and S2 PULM:  CTAB no wheezing or rhonchi GI/GU:  normal bowel sounds, non-distended, tender to palpation to the right lower quadrant MSK/SPINE:  No gross deformities, no edema SKIN:  no rash, atraumatic PSYCH:  Appropriate speech and behavior  *Additional and/or pertinent findings included in MDM below  Diagnostic and Interventional Summary    EKG Interpretation  Date/Time:    Ventricular Rate:    PR  Interval:    QRS Duration:   QT Interval:    QTC Calculation:   R Axis:     Text Interpretation:        Labs Reviewed  LIPASE, BLOOD - Abnormal; Notable for the following components:      Result Value   Lipase 52 (*)    All other components within normal limits  COMPREHENSIVE METABOLIC PANEL - Abnormal; Notable for the following components:   Glucose, Bld 128 (*)    All other components within normal limits  URINALYSIS, ROUTINE W REFLEX MICROSCOPIC - Abnormal; Notable for the following components:   Glucose, UA >=500 (*)    Leukocytes,Ua TRACE (*)    All other components within normal limits  CBC    CT ABDOMEN PELVIS W CONTRAST  Final Result      Medications  HYDROmorphone (DILAUDID) injection 0.5 mg (0.5 mg Intravenous Given 04/11/20 0126)  ondansetron (ZOFRAN) injection 4 mg (4 mg Intravenous Given 04/11/20 0125)  sodium chloride 0.9 % bolus 1,000 mL (0 mLs Intravenous Stopped 04/11/20 0416)  iohexol (OMNIPAQUE) 300 MG/ML solution 100 mL (100 mLs Intravenous Contrast Given 04/11/20 0148)  HYDROmorphone (DILAUDID) injection 0.5 mg (0.5 mg Intravenous Given 04/11/20 0417)     Procedures  /  Critical Care Procedures  ED Course and Medical Decision Making  I have reviewed the triage vital signs, the nursing notes, and pertinent available records from the EMR.  Listed above are laboratory and imaging tests that I personally ordered, reviewed, and interpreted and then considered in my medical decision making (see below for details).  CT to evaluate for complication of hernia such as incarceration.  Also considering appendicitis given the location of pain.     CT is without acute complication, no incarceration.  Patient is feeling better.  Upon reevaluation patient has a new tachycardia, narrow complex and regular.  She has a pacemaker in place.  Asymptomatic.  Heart rate up to 130.  Saint Jude device was interrogated, device is functioning normally, no ventricular arrhythmia.   Patient's heart rate returned to normal after about 5 minutes.  Nothing further to do regarding this atrial abnormality, patient is appropriate for discharge.  Her general surgeon Dr. Donne Hazel was messaged to see if  we could expedite her follow-up.  Barth Kirks. Sedonia Small, [place] [email or web address]  Final Clinical Impressions(s) / ED Diagnoses     ICD-10-CM   1. Abdominal pain, unspecified abdominal location  R10.9     ED Discharge Orders         Ordered    oxyCODONE (ROXICODONE) 5 MG immediate release tablet  Every 4 hours PRN        04/11/20 0440           Discharge Instructions Discussed with and Provided to Patient:     Discharge Instructions     You were evaluated in the Emergency Department and after careful evaluation, we did not find any emergent condition requiring admission or further testing in the hospital.  Your exam/testing today was overall reassuring.  Please return to the Emergency Department if you experience any worsening of your condition.  Thank you for allowing [place] to be a part of your care.        Maudie Flakes, MD 04/11/20 [phone]

## 2020-04-11 NOTE — Telephone Encounter (Signed)
Patient notified that  Device at RRT. Patient reports she has surgical consult this week and may be having a hernia repair. Will be expecting call from Dr Croitoru's office to discuss next steps to schedule generator change.

## 2020-04-11 NOTE — Discharge Instructions (Signed)
You were evaluated in the Emergency Department and after careful evaluation, we did not find any emergent condition requiring admission or further testing in the hospital.  Your exam/testing today was overall reassuring.  Please return to the Emergency Department if you experience any worsening of your condition.  Thank you for allowing us to be a part of your care.  

## 2020-04-11 NOTE — Telephone Encounter (Signed)
Called patient to remind her of her appointment on 04/14/20. She stated she was in the ED today due to hernia problems and would have to cancel appointment. She stated she would call back to reschedule appointment.

## 2020-04-11 NOTE — Telephone Encounter (Signed)
We have already discussed the generator chamgeout at last office visit. Can do H/P day of procedure. No need for office appt unless she requests it.

## 2020-04-11 NOTE — Telephone Encounter (Signed)
Understood, thank you. 

## 2020-04-11 NOTE — ED Notes (Signed)
Dr Bero at bedside.  

## 2020-04-12 ENCOUNTER — Other Ambulatory Visit: Payer: Self-pay | Admitting: Cardiovascular Disease

## 2020-04-13 ENCOUNTER — Other Ambulatory Visit (HOSPITAL_COMMUNITY)
Admission: RE | Admit: 2020-04-13 | Discharge: 2020-04-13 | Disposition: A | Discharge status: Home / Self Care | Payer: Medicare HMO | Source: Ambulatory Visit | Attending: Cardiovascular Disease | Admitting: Cardiovascular Disease

## 2020-04-13 DIAGNOSIS — Z20822 Contact with and (suspected) exposure to covid-19: Secondary | ICD-10-CM | POA: Diagnosis not present

## 2020-04-13 DIAGNOSIS — Z01812 Encounter for preprocedural laboratory examination: Secondary | ICD-10-CM | POA: Diagnosis not present

## 2020-04-13 LAB — SARS CORONAVIRUS 2 (TAT 6-24 HRS): SARS Coronavirus 2: NEGATIVE

## 2020-04-14 ENCOUNTER — Telehealth: Payer: Self-pay

## 2020-04-14 ENCOUNTER — Encounter: Payer: Medicare HMO | Admitting: Gastroenterology

## 2020-04-14 DIAGNOSIS — K432 Incisional hernia without obstruction or gangrene: Secondary | ICD-10-CM | POA: Diagnosis not present

## 2020-04-14 NOTE — Telephone Encounter (Signed)
   [facility] Pre-operative Risk Assessment    HEARTCARE STAFF: - Please ensure there is not already an duplicate clearance open for this procedure. - Under Visit Info/Reason for Call, type in Other and utilize the format Clearance MM/DD/YY or Clearance TBD. Do not use dashes or single digits. - If request is for dental extraction, please clarify the # of teeth to be extracted.  Request for surgical clearance:  1. What type of surgery is being performed? Hernia Repair   2. When is this surgery scheduled? TBD   3. What type of clearance is required (medical clearance vs. Pharmacy clearance to hold med vs. Both)?  Both   4. Are there any medications that need to be held prior to surgery and how long? Xarelto, How long to hold prior to procedure   5. Practice name and name of physician performing surgery? [facility],    6. What is the office phone number? [phone]   7.   What is the office fax number? [phone] Attn: Mammie Lorenzo, LPN  8.   Anesthesia type (None, local, MAC, general) ? General    [name] 04/14/2020, 12:57 PM  _________________________________________________________________   (provider comments below)

## 2020-04-14 NOTE — Telephone Encounter (Signed)
Patient with diagnosis of A Fib on Xarelto for anticoagulation.     Procedure: hernia repair Date of procedure: TBD   CHA2DS2-VASc Score = 6  This indicates a 9.7% annual risk of stroke. The patient's score is based upon: CHF History: Yes HTN History: Yes Diabetes History: Yes Stroke History: No Vascular Disease History: Yes Age Score: 1 Gender Score: 1    CrCl 80 mL/min Platelet count 252K   Per office protocol, patient can hold Xarelto for 2 days prior to procedure.   Patient will not need bridging with Lovenox (enoxaparin) around procedure.

## 2020-04-16 ENCOUNTER — Other Ambulatory Visit: Payer: Self-pay | Admitting: *Deleted

## 2020-04-16 DIAGNOSIS — Z950 Presence of cardiac pacemaker: Secondary | ICD-10-CM

## 2020-04-17 ENCOUNTER — Encounter (HOSPITAL_COMMUNITY)
Admission: RE | Disposition: A | Discharge status: Home / Self Care | Payer: Self-pay | Source: Home / Self Care | Attending: Cardiovascular Disease

## 2020-04-17 ENCOUNTER — Ambulatory Visit (HOSPITAL_COMMUNITY)
Admission: RE | Admit: 2020-04-17 | Discharge: 2020-04-17 | Disposition: A | Discharge status: Home / Self Care | Payer: Medicare HMO | Attending: Cardiovascular Disease | Admitting: Cardiovascular Disease

## 2020-04-17 ENCOUNTER — Other Ambulatory Visit: Payer: Self-pay

## 2020-04-17 DIAGNOSIS — Z79899 Other long term (current) drug therapy: Secondary | ICD-10-CM | POA: Diagnosis not present

## 2020-04-17 DIAGNOSIS — I110 Hypertensive heart disease with heart failure: Secondary | ICD-10-CM | POA: Insufficient documentation

## 2020-04-17 DIAGNOSIS — I480 Paroxysmal atrial fibrillation: Secondary | ICD-10-CM | POA: Insufficient documentation

## 2020-04-17 DIAGNOSIS — Z87891 Personal history of nicotine dependence: Secondary | ICD-10-CM | POA: Insufficient documentation

## 2020-04-17 DIAGNOSIS — Z7901 Long term (current) use of anticoagulants: Secondary | ICD-10-CM | POA: Insufficient documentation

## 2020-04-17 DIAGNOSIS — Z951 Presence of aortocoronary bypass graft: Secondary | ICD-10-CM | POA: Insufficient documentation

## 2020-04-17 DIAGNOSIS — E782 Mixed hyperlipidemia: Secondary | ICD-10-CM | POA: Diagnosis not present

## 2020-04-17 DIAGNOSIS — Z7984 Long term (current) use of oral hypoglycemic drugs: Secondary | ICD-10-CM | POA: Insufficient documentation

## 2020-04-17 DIAGNOSIS — I2510 Atherosclerotic heart disease of native coronary artery without angina pectoris: Secondary | ICD-10-CM | POA: Insufficient documentation

## 2020-04-17 DIAGNOSIS — E119 Type 2 diabetes mellitus without complications: Secondary | ICD-10-CM | POA: Diagnosis not present

## 2020-04-17 DIAGNOSIS — Z45010 Encounter for checking and testing of cardiac pacemaker pulse generator [battery]: Secondary | ICD-10-CM

## 2020-04-17 DIAGNOSIS — I5042 Chronic combined systolic (congestive) and diastolic (congestive) heart failure: Secondary | ICD-10-CM | POA: Diagnosis not present

## 2020-04-17 DIAGNOSIS — Z950 Presence of cardiac pacemaker: Secondary | ICD-10-CM | POA: Diagnosis present

## 2020-04-17 DIAGNOSIS — I441 Atrioventricular block, second degree: Secondary | ICD-10-CM

## 2020-04-17 HISTORY — PX: PPM GENERATOR CHANGEOUT: EP1233

## 2020-04-17 LAB — GLUCOSE, CAPILLARY: Glucose-Capillary: 127 mg/dL — ABNORMAL HIGH (ref 70–99)

## 2020-04-17 SURGERY — PPM GENERATOR CHANGEOUT

## 2020-04-17 MED ORDER — CEFAZOLIN SODIUM-DEXTROSE 2-4 GM/100ML-% IV SOLN
2.0000 g | INTRAVENOUS | Status: AC
  Administered 2020-04-17: 2 g via INTRAVENOUS
  Filled 2020-04-17: qty 100

## 2020-04-17 MED ORDER — LIDOCAINE HCL (PF) 1 % IJ SOLN
INTRAMUSCULAR | Status: AC
  Filled 2020-04-17: qty 60

## 2020-04-17 MED ORDER — LIDOCAINE HCL (PF) 1 % IJ SOLN
INTRAMUSCULAR | Status: DC | PRN
  Administered 2020-04-17: 45 mL

## 2020-04-17 MED ORDER — CHLORHEXIDINE GLUCONATE 4 % EX LIQD
4.0000 "application " | Freq: Once | CUTANEOUS | Status: DC
  Filled 2020-04-17: qty 60

## 2020-04-17 MED ORDER — SODIUM CHLORIDE 0.9 % IV SOLN: INTRAVENOUS | Status: DC

## 2020-04-17 MED ORDER — CEFAZOLIN SODIUM-DEXTROSE 2-4 GM/100ML-% IV SOLN
INTRAVENOUS | Status: AC
  Filled 2020-04-17: qty 100

## 2020-04-17 MED ORDER — SODIUM CHLORIDE 0.9 % IV SOLN
INTRAVENOUS | Status: AC
  Filled 2020-04-17: qty 2

## 2020-04-17 MED ORDER — SODIUM CHLORIDE 0.9 % IV SOLN
80.0000 mg | INTRAVENOUS | Status: AC
  Administered 2020-04-17: 80 mg
  Filled 2020-04-17: qty 2

## 2020-04-17 MED ORDER — HEPARIN (PORCINE) IN NACL 1000-0.9 UT/500ML-% IV SOLN: INTRAVENOUS | Status: DC | PRN

## 2020-04-17 SURGICAL SUPPLY — 5 items
CABLE SURGICAL S-101-97-12 (CABLE) ×2 IMPLANT
PACEMAKER ALLR CRT-P RF PM3222 (Pacemaker) ×1 IMPLANT
PAD PRO RADIOLUCENT 2001M-C (PAD) ×2 IMPLANT
PPM ALLURE CRT-P RF PM3222 (Pacemaker) ×2 IMPLANT
TRAY PACEMAKER INSERTION (PACKS) ×2 IMPLANT

## 2020-04-17 NOTE — Discharge Instructions (Signed)

## 2020-04-17 NOTE — Telephone Encounter (Signed)
Pt is having a generator change today. Will contact tomorrow for preop clearance.

## 2020-04-17 NOTE — Interval H&P Note (Signed)
History and Physical Interval Note:  04/17/2020 1:35 PM  [name]  has presented today for surgery, with the diagnosis of ERI.  The various methods of treatment have been discussed with the patient and family. After consideration of risks, benefits and other options for treatment, the patient has consented to  Procedure(s): PPM GENERATOR CHANGEOUT (N/A) as a surgical intervention.  The patient's history has been reviewed, patient examined, no change in status, stable for surgery.  I have reviewed the patient's chart and labs.  Questions were answered to the patient's satisfaction.     Jacek Colson

## 2020-04-17 NOTE — Progress Notes (Signed)
I called [name] to tell her to skip the Xarelto tonight, resume as usual tomorrow.

## 2020-04-17 NOTE — Op Note (Signed)
Procedure report  Procedure performed:  1. Dual chamber biventricular pacemaker (CRT-P) generator changeout   Reason for procedure:  1. Device generator at elective replacement interval  2. Chronic combined systolic and diastolic heart failure 3. Second degree AV block  Procedure performed by:  [name], MD  Complications:  None  Estimated blood loss:  <5 mL  Medications administered during procedure:  Ancef 2 g intravenously,lidocaine 1% 30 mL locally  Device details:   [employer]. Jude Allure RF model number [id number], serial number [id number] Right atrial lead (chronic) St. Jude , model number [phone], serial number [id number] (implanted 01/25/2013) Right ventricular lead (chronic)  St. Jude, model number 2088-52, serial number [id number] (implanted 01/25/2013) Left ventricular lead  Medtronic epicardial unipolar, model number [phone], serial number [id number] (implanted 09/28/2012)  Explanted generator St. Jude ,  model number 3222, serial number  [id number] (implanted 01/25/2013)  Procedure details:  After the risks and benefits of the procedure were discussed the patient provided informed consent. She was brought to the cardiac catheter lab in the fasting state. The patient was prepped and draped in usual sterile fashion. Local anesthesia with 1% lidocaine was administered to to the left infraclavicular area. A 5-6cm horizontal incision was made parallel with and 2-3 cm caudal to the left clavicle, in the area of an old scar. Using minimal electrocautery and mostly sharp and blunt dissection the prepectoral pocket was opened carefully to avoid injury to the loops of chronic leads. Extensive dissection was not necessary. The device was explanted. The pocket was carefully inspected for hemostasis and flushed with copious amounts of antibiotic solution.  The leads were disconnected from the old generator and connected to the new generator, with appropriate pacing noted. testing of the  lead parameters via telemetry showed excellent values.   The entire system was then carefully inserted in the pocket with care been taking that the leads and device assumed a comfortable position without pressure on the incision. Great care was taken that the leads be located deep to the generator. The pocket was then closed in layers using 2 layers of 2-0 Vicryl and one layer of 3-0 Vicryl, cutaneous steristrips after which a sterile dressing was applied.   At the end of the procedure the following lead parameters were encountered:   Right atrial lead sensed P waves 1.4 mV, impedance 440 ohms, threshold 0.7 at 0.4 ms pulse width.  Right ventricular lead sensed R waves  >12 mV, impedance 600 ohms, threshold 0.7 at 0.4 ms pulse width.  Left ventricular lead impedance 400 ohms, threshold 1.2 at 0.5 ms pulse width.   [name], MD, [facility] [phone] office [phone] pager

## 2020-04-17 NOTE — Progress Notes (Signed)
Discharge instructions reviewed with patient. Verbalized understanding. D/C at 1700- family updated.

## 2020-04-18 ENCOUNTER — Encounter (HOSPITAL_COMMUNITY): Payer: Self-pay | Admitting: Cardiovascular Disease

## 2020-04-18 NOTE — Telephone Encounter (Signed)
Pt stable with no new symptoms. I have faxed Dr. Victorino December last clinic note with clearance along with PharmD recommendations.

## 2020-04-19 ENCOUNTER — Encounter (HOSPITAL_COMMUNITY): Payer: Self-pay | Admitting: Cardiovascular Disease

## 2020-04-19 NOTE — Progress Notes (Signed)
Remote pacemaker transmission.   

## 2020-04-20 ENCOUNTER — Encounter: Payer: Self-pay | Admitting: Cardiovascular Disease

## 2020-04-20 DIAGNOSIS — M79605 Pain in left leg: Secondary | ICD-10-CM

## 2020-04-20 NOTE — Telephone Encounter (Signed)
error 

## 2020-04-21 NOTE — Telephone Encounter (Signed)
Since the symptoms sound symmetrical, I would suspect this is more likely neuropathy from diabetes, rather than a vascular issue. Blood flow in legs was pretty normal in 2014 (checked before CABG). But that was a long time ago. Let's go ahead and order ABI please.

## 2020-04-24 ENCOUNTER — Other Ambulatory Visit: Payer: Self-pay

## 2020-04-24 ENCOUNTER — Ambulatory Visit (HOSPITAL_COMMUNITY)
Admission: RE | Admit: 2020-04-24 | Discharge: 2020-04-24 | Disposition: A | Discharge status: Home / Self Care | Payer: Medicare HMO | Source: Ambulatory Visit | Attending: Cardiology | Admitting: Cardiology

## 2020-04-24 ENCOUNTER — Other Ambulatory Visit: Payer: Self-pay | Admitting: Cardiovascular Disease

## 2020-04-24 DIAGNOSIS — M79605 Pain in left leg: Secondary | ICD-10-CM

## 2020-04-24 DIAGNOSIS — I739 Peripheral vascular disease, unspecified: Secondary | ICD-10-CM | POA: Insufficient documentation

## 2020-04-27 ENCOUNTER — Other Ambulatory Visit: Payer: Self-pay

## 2020-04-27 ENCOUNTER — Ambulatory Visit: Payer: Medicare HMO | Admitting: Student

## 2020-04-27 DIAGNOSIS — I5042 Chronic combined systolic (congestive) and diastolic (congestive) heart failure: Secondary | ICD-10-CM

## 2020-04-27 LAB — CUP PACEART INCLINIC DEVICE CHECK
Battery Remaining Longevity: 76 mo
Battery Voltage: 3.02 V
Brady Statistic RA Percent Paced: 0 %
Brady Statistic RV Percent Paced: 99.96 %
Date Time Interrogation Session: 20220217122510
Implantable Lead Implant Date: 20140721
Implantable Lead Implant Date: 20141117
Implantable Lead Implant Date: 20141117
Implantable Lead Location: 753858
Implantable Lead Location: 753859
Implantable Lead Location: 753860
Implantable Lead Model: 5071
Implantable Pulse Generator Implant Date: 20220207
Lead Channel Impedance Value: 387.5 Ohm
Lead Channel Impedance Value: 412.5 Ohm
Lead Channel Impedance Value: 600 Ohm
Lead Channel Pacing Threshold Amplitude: 0.5 V
Lead Channel Pacing Threshold Amplitude: 0.5 V
Lead Channel Pacing Threshold Amplitude: 0.75 V
Lead Channel Pacing Threshold Amplitude: 0.75 V
Lead Channel Pacing Threshold Amplitude: 1.5 V
Lead Channel Pacing Threshold Amplitude: 1.5 V
Lead Channel Pacing Threshold Pulse Width: 0.4 ms
Lead Channel Pacing Threshold Pulse Width: 0.4 ms
Lead Channel Pacing Threshold Pulse Width: 0.4 ms
Lead Channel Pacing Threshold Pulse Width: 0.4 ms
Lead Channel Pacing Threshold Pulse Width: 0.5 ms
Lead Channel Pacing Threshold Pulse Width: 0.5 ms
Lead Channel Sensing Intrinsic Amplitude: 1.3 mV
Lead Channel Sensing Intrinsic Amplitude: 12 mV
Lead Channel Setting Pacing Amplitude: 2 V
Lead Channel Setting Pacing Amplitude: 2.5 V
Lead Channel Setting Pacing Amplitude: 2.5 V
Lead Channel Setting Pacing Pulse Width: 0.4 ms
Lead Channel Setting Pacing Pulse Width: 0.5 ms
Lead Channel Setting Sensing Sensitivity: 2 mV
Pulse Gen Model: 3222
Pulse Gen Serial Number: 3859407

## 2020-04-27 NOTE — Progress Notes (Signed)
Wound check appointment. Steri-strips removed. Wound without redness or edema. Incision edges approximated, wound well healed. Normal device function. Thresholds, sensing, and impedances consistent with implant measurements. Device programmed at appropriate safety margins for chronic leads. Histogram distribution appropriate for patient and level of activity. No mode switches or high ventricular rates noted. Patient educated about wound care, arm mobility, lifting restrictions. ROV in 3 months with implanting physician.

## 2020-05-04 DIAGNOSIS — K432 Incisional hernia without obstruction or gangrene: Secondary | ICD-10-CM | POA: Diagnosis not present

## 2020-05-12 DIAGNOSIS — E1165 Type 2 diabetes mellitus with hyperglycemia: Secondary | ICD-10-CM | POA: Diagnosis not present

## 2020-05-12 DIAGNOSIS — I10 Essential (primary) hypertension: Secondary | ICD-10-CM | POA: Diagnosis not present

## 2020-05-17 ENCOUNTER — Other Ambulatory Visit: Payer: Self-pay | Admitting: Cardiovascular Disease

## 2020-06-26 ENCOUNTER — Encounter: Payer: Medicare HMO | Admitting: Cardiovascular Disease

## 2020-06-26 DIAGNOSIS — Z0189 Encounter for other specified special examinations: Secondary | ICD-10-CM | POA: Diagnosis not present

## 2020-06-26 DIAGNOSIS — Z0000 Encounter for general adult medical examination without abnormal findings: Secondary | ICD-10-CM | POA: Diagnosis not present

## 2020-06-26 DIAGNOSIS — Z7189 Other specified counseling: Secondary | ICD-10-CM | POA: Diagnosis not present

## 2020-06-26 DIAGNOSIS — K432 Incisional hernia without obstruction or gangrene: Secondary | ICD-10-CM | POA: Diagnosis not present

## 2020-06-28 ENCOUNTER — Other Ambulatory Visit: Payer: Self-pay | Admitting: Cardiovascular Disease

## 2020-06-29 LAB — HEMOGLOBIN A1C: Hemoglobin A1C: 7

## 2020-07-03 DIAGNOSIS — Z0000 Encounter for general adult medical examination without abnormal findings: Secondary | ICD-10-CM | POA: Diagnosis not present

## 2020-07-03 DIAGNOSIS — K432 Incisional hernia without obstruction or gangrene: Secondary | ICD-10-CM | POA: Diagnosis not present

## 2020-07-10 ENCOUNTER — Encounter: Payer: Self-pay | Admitting: *Deleted

## 2020-07-12 ENCOUNTER — Telehealth: Payer: Self-pay

## 2020-07-12 NOTE — Telephone Encounter (Signed)
   [facility] Pre-operative Risk Assessment    Patient Name: [name] [id number]   MRN: [id number]  Request for surgical clearance:  1. What type of surgery is being performed? Hernia Repair   2. When is this surgery scheduled? TBD   3. What type of clearance is required (medical clearance vs. Pharmacy clearance to hold med vs. Both)?  Both   Are there any medications that need to be held prior to surgery and how long? Xarelto  Practice name and name of physician performing surgery? [facility]   What is the office phone number? [phone]   7.   What is the office fax number? [phone]  8.   Anesthesia type (None, local, MAC, general) ? NOT LISTED

## 2020-07-13 NOTE — Telephone Encounter (Signed)
   Name: [name]  DOB: 1950-06-08  MRN: [id number]   Primary Cardiologist: [name], MD  Chart reviewed as part of pre-operative protocol coverage. Patient was contacted 07/13/2020 in reference to pre-operative risk assessment for pending surgery as outlined below.  [name] was last seen on 04/27/20 by Oda Kilts, PA-C.  Since that day, [name] has done well from a cardiac standpoint. She can complete 4 METs without anginal complaints.   Therefore, based on ACC/AHA guidelines, the patient would be at acceptable risk for the planned procedure without further cardiovascular testing.   The patient was advised that if she develops new symptoms prior to surgery to contact our office to arrange for a follow-up visit, and she verbalized understanding.  Per pharmacy recommendations, patient can hold xarelto 2 days prior to her upcoming hernia repair. Xarelto should be restarted as soon as she is cleared to do so by her surgeon.  I will route this recommendation to the requesting party via Epic fax function and remove from pre-op pool. Please call with questions.  Abigail Butts, PA-C 07/13/2020, 2:40 PM

## 2020-07-13 NOTE — Telephone Encounter (Signed)
Pharmacy, can you please comment on how long Xarelto can be held for upcoming procedure?  Thank you! 

## 2020-07-13 NOTE — Telephone Encounter (Signed)
Patient with diagnosis of afib on Xarelto for anticoagulation.    Procedure: hernia repair Date of procedure: TBD  CHA2DS2-VASc Score = 6  This indicates a 9.7% annual risk of stroke. The patient's score is based upon: CHF History: Yes HTN History: Yes Diabetes History: Yes Stroke History: No Vascular Disease History: Yes Age Score: 1 Gender Score: 1     CrCl 64 ml/min Platelet count 252  Per office protocol, patient can hold Xarelto for 2 days prior to procedure.

## 2020-07-18 ENCOUNTER — Ambulatory Visit (INDEPENDENT_AMBULATORY_CARE_PROVIDER_SITE_OTHER): Payer: Medicare HMO

## 2020-07-18 DIAGNOSIS — I255 Ischemic cardiomyopathy: Secondary | ICD-10-CM

## 2020-07-18 LAB — CUP PACEART REMOTE DEVICE CHECK
Battery Remaining Longevity: 81 mo
Battery Remaining Percentage: 95.5 %
Battery Voltage: 2.99 V
Brady Statistic AP VP Percent: 1 %
Brady Statistic AP VS Percent: 1 %
Brady Statistic AS VP Percent: 99 %
Brady Statistic AS VS Percent: 1 %
Brady Statistic RA Percent Paced: 1 %
Date Time Interrogation Session: 20220509080201
Implantable Lead Implant Date: 20140721
Implantable Lead Implant Date: 20141117
Implantable Lead Implant Date: 20141117
Implantable Lead Location: 753858
Implantable Lead Location: 753859
Implantable Lead Location: 753860
Implantable Lead Model: 5071
Implantable Pulse Generator Implant Date: 20220207
Lead Channel Impedance Value: 390 Ohm
Lead Channel Impedance Value: 410 Ohm
Lead Channel Impedance Value: 580 Ohm
Lead Channel Pacing Threshold Amplitude: 0.5 V
Lead Channel Pacing Threshold Amplitude: 0.75 V
Lead Channel Pacing Threshold Amplitude: 1.5 V
Lead Channel Pacing Threshold Pulse Width: 0.4 ms
Lead Channel Pacing Threshold Pulse Width: 0.4 ms
Lead Channel Pacing Threshold Pulse Width: 0.5 ms
Lead Channel Sensing Intrinsic Amplitude: 1.2 mV
Lead Channel Sensing Intrinsic Amplitude: 12 mV
Lead Channel Setting Pacing Amplitude: 2 V
Lead Channel Setting Pacing Amplitude: 2.5 V
Lead Channel Setting Pacing Amplitude: 2.5 V
Lead Channel Setting Pacing Pulse Width: 0.4 ms
Lead Channel Setting Pacing Pulse Width: 0.5 ms
Lead Channel Setting Sensing Sensitivity: 2 mV
Pulse Gen Model: 3222
Pulse Gen Serial Number: 3859407

## 2020-07-31 ENCOUNTER — Encounter: Payer: Self-pay | Admitting: Cardiovascular Disease

## 2020-07-31 ENCOUNTER — Ambulatory Visit (INDEPENDENT_AMBULATORY_CARE_PROVIDER_SITE_OTHER): Payer: Medicare HMO | Admitting: Cardiovascular Disease

## 2020-07-31 ENCOUNTER — Other Ambulatory Visit: Payer: Self-pay

## 2020-07-31 VITALS — BP 126/72 | HR 98 | Ht 60.0 in | Wt 147.2 lb

## 2020-07-31 DIAGNOSIS — I5042 Chronic combined systolic (congestive) and diastolic (congestive) heart failure: Secondary | ICD-10-CM

## 2020-07-31 DIAGNOSIS — Z950 Presence of cardiac pacemaker: Secondary | ICD-10-CM

## 2020-07-31 DIAGNOSIS — E782 Mixed hyperlipidemia: Secondary | ICD-10-CM

## 2020-07-31 DIAGNOSIS — I10 Essential (primary) hypertension: Secondary | ICD-10-CM | POA: Diagnosis not present

## 2020-07-31 DIAGNOSIS — I480 Paroxysmal atrial fibrillation: Secondary | ICD-10-CM | POA: Diagnosis not present

## 2020-07-31 DIAGNOSIS — Z01810 Encounter for preprocedural cardiovascular examination: Secondary | ICD-10-CM

## 2020-07-31 DIAGNOSIS — I2510 Atherosclerotic heart disease of native coronary artery without angina pectoris: Secondary | ICD-10-CM | POA: Diagnosis not present

## 2020-07-31 DIAGNOSIS — E1159 Type 2 diabetes mellitus with other circulatory complications: Secondary | ICD-10-CM | POA: Diagnosis not present

## 2020-07-31 DIAGNOSIS — Z7901 Long term (current) use of anticoagulants: Secondary | ICD-10-CM

## 2020-07-31 LAB — LIPID PANEL
Chol/HDL Ratio: 2.2 ratio (ref 0.0–4.4)
Cholesterol, Total: 135 mg/dL (ref 100–199)
HDL: 61 mg/dL (ref >39)
LDL Chol Calc (NIH): 48 mg/dL (ref 0–99)
Triglycerides: 157 mg/dL — ABNORMAL HIGH (ref 0–149)
VLDL Cholesterol Cal: 26 mg/dL (ref 5–40)

## 2020-07-31 NOTE — Patient Instructions (Signed)
Medication Instructions:  No changes *If you need a refill on your cardiac medications before your next appointment, please call your pharmacy*   Lab Work: Your provider would like for you to have the following labs today: fasting lipid  If you have labs (blood work) drawn today and your tests are completely normal, you will receive your results only by: Marland Kitchen MyChart Message (if you have MyChart) OR . A paper copy in the mail If you have any lab test that is abnormal or we need to change your treatment, we will call you to review the results.   Testing/Procedures: None ordered   Follow-Up: At [facility], you and your health needs are our priority.  As part of our continuing mission to provide you with exceptional heart care, we have created designated Provider Care Teams.  These Care Teams include your primary Cardiologist (physician) and Advanced Practice Providers (APPs -  Physician Assistants and Nurse Practitioners) who all work together to provide you with the care you need, when you need it.  We recommend signing up for the patient portal called "MyChart".  Sign up information is provided on this After Visit Summary.  MyChart is used to connect with patients for Virtual Visits (Telemedicine).  Patients are able to view lab/test results, encounter notes, upcoming appointments, etc.  Non-urgent messages can be sent to your provider as well.   To learn more about what you can do with MyChart, go to [email or web address].    Your next appointment:   12 month(s)  The format for your next appointment:   In Person  Provider:   [name], MD

## 2020-07-31 NOTE — Progress Notes (Signed)
Cardiology Office Note:    Date:  07/31/2020   ID:  [name], DOB 1951/03/09, MRN [id number]  PCP:  [name], Michelle Halsted, MD  Cardiologist:  Michelle Klein, MD    Referring MD: [name], Estel*   Chief Complaint  Patient presents with  . Congestive Heart Failure  . Pacemaker Check   CRT-P  History of Present Illness:    [name] is a 70 y.o. female with a hx of CAD s/p CABG 2014, paroxysmal atrial fibrillation, CHF with recovery of EF after CRT-P (St. Jude), type 2 diabetes mellitus, hypercholesterolemia, hypertension returning for follow-up.  She has not had any palpitations since her pacemaker generator change out in February.  The site has healed well.  Device interrogation shows normal function (Saint Jude Allure dual-chamber CRT-P implanted 04/17/2020).  Estimated generator longevity 6.2-7.2 years.  All the lead parameters are stable and in desirable range except the sensed P waves 4 a little low at 0.6 mV today.  She has 100% biventricular pacing.  There have only been 10 seconds of mode switch and no episodes of high ventricular rates since the generator exchange.  Corevue is reportedly out of range but seems to be trending quickly back to normal.  She is planning surgery for her umbilical hernia.  This will be performed by a specialist in [place], since she has had so many previous abdominal surgeries.  The patient specifically denies any chest pain at rest exertion, dyspnea at rest or with exertion, orthopnea, paroxysmal nocturnal dyspnea, syncope, palpitations, focal neurological deficits, intermittent claudication, lower extremity edema, unexplained weight gain, cough, hemoptysis or wheezing.   ECG is nondiagnostic due to biventricular pacing (she had an LBBB before).  Nuclear stress test in February 2021 showed very minor abnormalities: A previously described fixed apical defect, and a possible small area of basal inferolateral ischemia.  EF was 51%.   She has not had repeat cardiac catheterization since her bypass surgery in 2014.  She has good glycemic control with a hemoglobin A1c of 7.1% and an excellent lipid profile with an LDL cholesterol of 19 on Repatha.  [name] presented with myocardial infarction in 2004. In May 2014 presented with congestive heart failure and atrial fibrillation in the setting of three-vessel coronary artery disease leading to bypass surgery (July 2014, Dr. Servando Snare, LIMA to LAD, SVG to ramus intermedius, sequential SVG to OM1 and OM 2, surgical maze procedure). She had persistent depressed left ventricular systolic function and received a dual-chamber biventricular pacemaker (St. Jude Allure, Dr. Lovena Le in November 2014).   Last echo February 2016 shows recovery of left ventricular ejection fraction 50-55%. In January 2015, she was admitted with acute on chronic heart failure due to excessive intravenous fluid administration during an episode of small bowel obstruction.. She has type 2 diabetes mellitus that has been difficult to control as well as hypertension and hyperlipidemia.  Nuclear stress test 04/28/2019  The left ventricular ejection fraction is mildly decreased (45-54%).  Nuclear stress EF: 51%.  There was no ST segment deviation noted during stress.  No T wave inversion was noted during stress.  Defect 1: There is a small defect of mild severity present in the apex location.  Defect 2: There is a small defect of mild severity present in the basal inferolateral location.  This is a low risk study.  Findings consistent with prior myocardial infarction.   Low risk nuclear scan with a small apical scar (previously described) and a possibly new small area of  mild inferolateral ischemia (sum difference 4%) and borderline reduction in left ventricular systolic function ([id number]).   Past Medical History:  Diagnosis Date  . Acute on chronic combined systolic and diastolic CHF, NYHA class 3 ([place])  03/19/2013  . AICD (automatic cardioverter/defibrillator) present 01/2013   Biventricular cardiac pacemaker in situ   . Allergic rhinitis   . Anogenital warts 01/22/2011  . Anxiety state, unspecified 05/13/2013  . BREAST CANCER, HX OF 09/19/2009   Qualifier: Diagnosis of  By: [name], Jory Ee   . CAD (coronary artery disease)    a. 2004: s/p MI in [place]. No PCI->Medical RX;  b. 07/2012 Cath: LM 30-40, LAD 70p, 70/72m, D1 80-90p, OM1 small 90p, OM2 large 80-90p, 58m, 70-80d, RCA 20-30 diff, EF 40%, glob HK.s/p CABG  . Cancer of left breast [facility]) 2002   Patient reports left breast cancer diagnosis in 2002 treated with bilateral mastectomy positive lymph nodes with left axillary dissection followed by chemotherapy of unknown type  . Cataract   . Chronic combined systolic and diastolic CHF, NYHA class 2 ([place]) 01/08/2013  . Diabetes mellitus type II   . Exertional dyspnea 08/05/2014  . Exertional shortness of breath   . Generalized osteoarthrosis, involving multiple sites 08/25/2015  . Hyperlipidemia   . Hypertension   . Incisional hernia, without obstruction or gangrene 05/04/2015  . Ischemic cardiomyopathy    a. 07/2012 Echo: EF 35%, Sev inferoseptal HK, mildly dil LA, Peak PASP 81mmHg.  Marland Kitchen LBBB (left bundle branch block)    a. intermittent - present during rapid afib 07/2012.  Marland Kitchen MYOCARDIAL INFARCTION, HX OF 09/19/2009   Qualifier: Diagnosis of  By: [name], Jory Ee   . Neuromuscular disorder ([place])    Patient reports chronic numbness in the right foot related to previous surgery on the right leg and "nerve damage"  . PAF (paroxysmal atrial fibrillation) ([place])    a. 07/2012: Amio and xarelto initiated.  . Right carotid bruit 08/11/2012  . SBO (small bowel obstruction) ([place])   . SOB (shortness of breath) 02/26/2018  . Type 2 diabetes mellitus with circulatory disorder, without long-term current use of insulin ([place]) 10/03/2015  . Vitamin D deficiency 02/22/2016    Past Surgical History:   Procedure Laterality Date  . ABDOMINAL HYSTERECTOMY  2000  . APPENDECTOMY  1974  . BI-VENTRICULAR PACEMAKER INSERTION N/A 01/25/2013   Procedure: BI-VENTRICULAR PACEMAKER INSERTION (CRT-P);  Surgeon: Evans Lance, MD;  Location: [facility] CATH LAB;  Service: Cardiovascular;  Laterality: N/A;  . BREAST BIOPSY Left 2002  . CARDIAC CATHETERIZATION    . CHOLECYSTECTOMY OPEN  1974  . CORONARY ARTERY BYPASS GRAFT N/A 09/28/2012   Procedure: CORONARY ARTERY BYPASS GRAFTING (CABG);  Surgeon: [name], MD;  Location: [place];  Service: Open Heart Surgery;  Laterality: N/A;  CABG x four, using left internal mammary artery and left leg greater saphenous vein harvested endoscopically  . DILATION AND CURETTAGE OF UTERUS  1983  . EPICARDIAL PACING LEAD PLACEMENT N/A 09/28/2012   Procedure: EPICARDIAL PACING LEAD PLACEMENT;  Surgeon: [name], MD;  Location: [place];  Service: Thoracic;  Laterality: N/A;  LV LEAD PLACEMENT  . HERNIA REPAIR    . INCISIONAL HERNIA REPAIR N/A 05/25/2015   Procedure: LAPAROSCOPIC INCISIONAL HERNIA WITH MESH ;  Surgeon: [name], MD;  Location: [place];  Service: General;  Laterality: N/A;  . INCONTINENCE SURGERY  2000  . INSERTION OF MESH N/A 05/25/2015   Procedure: INSERTION OF MESH;  Surgeon: Rodman Key  Donne Hazel, MD;  Location: [place];  Service: General;  Laterality: N/A;  . INTRAOPERATIVE TRANSESOPHAGEAL ECHOCARDIOGRAM N/A 09/28/2012   Procedure: INTRAOPERATIVE TRANSESOPHAGEAL ECHOCARDIOGRAM;  Surgeon: [name], MD;  Location: [place];  Service: Open Heart Surgery;  Laterality: N/A;  . LAPAROSCOPIC INCISIONAL / UMBILICAL / VENTRAL HERNIA REPAIR  05/25/2015   IHR  . LEFT HEART CATHETERIZATION WITH CORONARY ANGIOGRAM N/A 07/20/2012   Procedure: LEFT HEART CATHETERIZATION WITH CORONARY ANGIOGRAM;  Surgeon: [name], MD;  Location[facility] C[facility] LAB;  Service: Cardiovascular;  Laterality: N/A;  . MASTECTOMY Right 2002  . MASTECTOMY MODIFIED RADICAL W/ AXILLARY  LYMPH NODES W/ OR W/O PECTORALIS MINOR Left 2002  . MAZE N/A 09/28/2012   Procedure: MAZE;  Surgeon: [name], MD;  Location: [place];  Service: Open Heart Surgery;  Laterality: N/A;  . ORIF WRIST FRACTURE Right 11/08/2017   Procedure: OPEN REDUCTION INTERNAL FIXATION (ORIF) WRIST FRACTURE;  Surgeon: [name], MD;  Location: [place];  Service: Orthopedics;  Laterality: Right;  90 mins  . PPM GENERATOR CHANGEOUT N/A 04/17/2020   Procedure: PPM GENERATOR CHANGEOUT;  Surgeon: Michelle Klein, MD;  Location: [place] CV LAB;  Service: Cardiovascular;  Laterality: N/A;  . TENDON REPAIR Right 2001 X 3-4   torn ligaments and tendons in ankle up to knee from work related accident  . TUBAL LIGATION  1987    Current Medications: Current Meds  Medication Sig  . APPLE CIDER VINEGAR PO Take 5,220 mg by mouth in the morning, at noon, and at bedtime. 1740 mg each  . Blood Glucose Monitoring Suppl (TRUE METRIX AIR GLUCOSE METER) DEVI Check blood sugar 2 times a day  . carvedilol (COREG) 12.5 MG tablet TAKE 1 TABLET (12.5 MG) BY MOUTH 2 (TWO) TIMES DAILY WITH A MEAL.  . Dulaglutide (TRULICITY) 1.5 0000000 SOPN INJECT 1.5MG  (1 PEN) SUBCUTANEOUSLY EVERY WEEK (Patient taking differently: Inject 1.5 mg into the skin every Sunday.)  . empagliflozin (JARDIANCE) 25 MG TABS tablet Take 1 tablet (25 mg total) by mouth daily before breakfast.  . furosemide (LASIX) 20 MG tablet TAKE 1 TABLET EVERY DAY (Patient taking differently: Take 20 mg by mouth daily.)  . glipiZIDE (GLUCOTROL) 5 MG tablet TAKE ONE TABLET BY MOUTH TWICE A DAY BEFORE A MEAL (Patient taking differently: Take 5 mg by mouth 2 (two) times daily before a meal.)  . glucose blood (TRUE METRIX BLOOD GLUCOSE TEST) test strip Use to check blood sugar 2 times a day.  . isosorbide mononitrate (IMDUR) 60 MG 24 hr tablet TAKE 1 TABLET EVERY DAY  . losartan (COZAAR) 25 MG tablet TAKE 1 TABLET TWICE DAILY (Patient taking differently: Take 25 mg by mouth  2 (two) times daily.)  . metFORMIN (GLUCOPHAGE) 1000 MG tablet TAKE ONE TABLET BY MOUTH TWICE A DAY WITH FOOD (Patient taking differently: Take 1,000 mg by mouth 2 (two) times daily with a meal.)  . nitroGLYCERIN (NITROSTAT) 0.4 MG SL tablet Place 1 tablet (0.4 mg total) under the tongue every 5 (five) minutes as needed for chest pain.  Marland Kitchen REPATHA SURECLICK [id number] MG/ML SOAJ INJECT 140 MG INTO THE SKIN EVERY 14 (FOURTEEN) DAYS.  . TRUEplus Lancets 33G MISC Use to check blood sugar 2 times a day.  Alveda Reasons 20 MG TABS tablet TAKE 1 TABLET (20 MG TOTAL) BY MOUTH DAILY WITH SUPPER.  . [DISCONTINUED] fluticasone (FLONASE) 50 MCG/ACT nasal spray Place 2 sprays into both nostrils daily. (Patient taking differently: Place 2 sprays into both nostrils  daily as needed (Sinus infection).)  . [DISCONTINUED] oxyCODONE (ROXICODONE) 5 MG immediate release tablet Take 1 tablet (5 mg total) by mouth every 4 (four) hours as needed for severe pain. (Patient taking differently: Take 5 mg by mouth at bedtime.)  . [DISCONTINUED] PEG-KCl-NaCl-NaSulf-Na Asc-C (PLENVU) 140 g SOLR Take 140 g by mouth as directed. Manufacturer's coupon Universal coupon code:BIN: P2366821; GROUP: [id number]; PCN: [id number]; ID: [id number]; PAY NO MORE $50     Allergies:   Statins   Social History   Socioeconomic History  . Marital status: Married    Spouse name: Not on file  . Number of children: 3  . Years of education: Not on file  . Highest education level: Not on file  Occupational History  . Occupation: Marketing executive work    Fish farm manager: [employer]. [employer].  Tobacco Use  . Smoking status: Former Smoker    Packs/day: 1.00    Years: 10.00    Pack years: 10.00    Types: Cigarettes    Quit date: 03/11/2012    Years since quitting: 8.3  . Smokeless tobacco: Never Used  Vaping Use  . Vaping Use: Never used  Substance and Sexual Activity  . Alcohol use: No  . Drug use: No  . Sexual activity: Yes    Partners: Male    Birth control/protection:  Surgical  Other Topics Concern  . Not on file  Social History Narrative   Lives with husband and mother in law.     Regular exercise: walking   Caffeine use: 2 cups of coffee in the morning   Social Determinants of Health   Financial Resource Strain: Not on file  Food Insecurity: Not on file  Transportation Needs: Not on file  Physical Activity: Not on file  Stress: Not on file  Social Connections: Not on file     Family History: The patient's family history includes Arthritis in an other family member; Colon cancer in her sister; Diabetes in her mother and another family member; Heart disease in her mother; Hyperlipidemia in an other family member; Kidney failure in her mother; Liver cancer in her brother; Ovarian cancer in an other family member. There is no history of Stomach cancer, Rectal cancer, Esophageal cancer, or Pancreatic cancer. ROS:   Please see the history of present illness.    All other systems are reviewed and are negative.   EKGs/Labs/Other Studies Reviewed:   Nuclear stress test 04/28/2019  The left ventricular ejection fraction is mildly decreased (45-54%).  Nuclear stress EF: 51%.  There was no ST segment deviation noted during stress.  No T wave inversion was noted during stress.  Defect 1: There is a small defect of mild severity present in the apex location.  Defect 2: There is a small defect of mild severity present in the basal inferolateral location.  This is a low risk study.  Findings consistent with prior myocardial infarction.   Low risk nuclear scan with a small apical scar (previously described) and a possibly new small area of mild inferolateral ischemia (sum difference 4%) and borderline reduction in left ventricular systolic function (WN02%).   EKG:  EKG is not ordered today.  The tracing performed just after generator change out shows atrial sensed, biventricular paced rhythm with prominent R in lead V1  Recent Labs: 04/10/2020:  ALT 27; BUN 14; Creatinine, Ser 0.73; Hemoglobin 13.8; Platelets 252; Potassium 3.8; Sodium 138  Recent Lipid Panel    Component Value Date/Time   CHOL 100 04/19/2019 0854  TRIG 115 04/19/2019 0854   HDL 61 04/19/2019 0854   CHOLHDL 1.6 04/19/2019 0854   CHOLHDL 5.2 (H) 04/04/2016 0723   VLDL NOT CALC 04/04/2016 0723   LDLCALC 19 04/19/2019 0854   LDLDIRECT 50 04/04/2016 0723    Physical Exam:    VS:  BP 126/72   Pulse 98   Ht 5' (1.524 m)   Wt 147 lb 3.2 oz (66.8 kg)   SpO2 (!) 87%   BMI 28.75 kg/m     Wt Readings from Last 3 Encounters:  07/31/20 147 lb 3.2 oz (66.8 kg)  04/17/20 151 lb (68.5 kg)  03/20/20 152 lb (68.9 kg)     General: Alert, oriented x3, no distress, well-healed left subclavian pacemaker site Head: no evidence of trauma, PERRL, EOMI, no exophtalmos or lid lag, no myxedema, no xanthelasma; normal ears, nose and oropharynx Neck: normal jugular venous pulsations and no hepatojugular reflux; brisk carotid pulses without delay and no carotid bruits Chest: clear to auscultation, no signs of consolidation by percussion or palpation, normal fremitus, symmetrical and full respiratory excursions Cardiovascular: normal position and quality of the apical impulse, regular rhythm, normal first and second heart sounds, no murmurs, rubs or gallops Abdomen: no tenderness or distention, no masses by palpation, no abnormal pulsatility or arterial bruits, normal bowel sounds, no hepatosplenomegaly Extremities: no clubbing, cyanosis or edema; 2+ radial, ulnar and brachial pulses bilaterally; 2+ right femoral, posterior tibial and dorsalis pedis pulses; 2+ left femoral, posterior tibial and dorsalis pedis pulses; no subclavian or femoral bruits Neurological: grossly nonfocal Psych: Normal mood and affect  ASSESSMENT:    1. Chronic combined systolic and diastolic CHF, NYHA class 2 ([place])   2. Coronary artery disease involving native coronary artery of native heart without  angina pectoris   3. Type 2 diabetes mellitus with other circulatory complication, without long-term current use of insulin (H[facility])   4. Paroxysmal atrial fibrillation ([place])   5. Long term current use of anticoagulant   6. Mixed hyperlipidemia   7. Essential hypertension   8. Biventricular cardiac pacemaker in situ   9. Preoperative cardiovascular examination    PLAN:    In order of problems listed above:  1. CAD s/p CABG: Denies angina.  She had a low risk recent nuclear stress test.  Past episodes of abrupt onset and unpredictable chest discomfort appeared to improve following initiation of treatment with long-acting nitrates.  Not sure whether this is because we were treating coronary spasm or esophageal spasm.      2. CHF: Excellent functional status, asymptomatic (NYHA functional class I) and clinically euvolemic.  She is a CRT-P hyperresponder.   Prior to CRT-P LVEF was 35-40% and she had a lot of problems with heart failure exacerbation, but following resynchronization therapy EF was 50-55% and she has not had problems with heart failure in a very long time.  On ARB, carvedilol and SGLT2 inhibitor, very low-dose of loop diuretic. 3. DM: Most recent hemoglobin A1c was close to target at 7.1%. 4. AFib: None has occurred recently.  She does have occasional atrial tachycardia.  She did have a MAZE procedure at the time of bypass.   Continue anticoagulation.. CHADSVasc 76 (age, gender,  CHF, CAD, DM, HTN). 5. Xarelto: Denies falls or bleeding complications. 6. HLP: Intolerant to multiple statins due to myopathy, but excellent results on Repatha.  Continue same medications. 7. HTN: Well-controlled.  8. CRT-P: Well-healed after recent generator change out.  Continue remote downloads every 3 months.  9.  Preop cardiovascular exam: Well compensated cardiac problems.  Low risk for major cardiovascular complications with planned umbilical hernia surgery.  Hold the anticoagulant for 2 days before the  procedure.  The surgery should not interfere with normal pacemaker function.  She is not pacemaker dependent.   Patient Instructions  Medication Instructions:  No changes *If you need a refill on your cardiac medications before your next appointment, please call your pharmacy*   Lab Work: Your provider would like for you to have the following labs today: fasting lipid  If you have labs (blood work) drawn today and your tests are completely normal, you will receive your results only by: Marland Kitchen MyChart Message (if you have MyChart) OR . A paper copy in the mail If you have any lab test that is abnormal or we need to change your treatment, we will call you to review the results.   Testing/Procedures: None ordered   Follow-Up: At [facility], you and your health needs are our priority.  As part of our continuing mission to provide you with exceptional heart care, we have created designated Provider Care Teams.  These Care Teams include your primary Cardiologist (physician) and Advanced Practice Providers (APPs -  Physician Assistants and Nurse Practitioners) who all work together to provide you with the care you need, when you need it.  We recommend signing up for the patient portal called "MyChart".  Sign up information is provided on this After Visit Summary.  MyChart is used to connect with patients for Virtual Visits (Telemedicine).  Patients are able to view lab/test results, encounter notes, upcoming appointments, etc.  Non-urgent messages can be sent to your provider as well.   To learn more about what you can do with MyChart, go to [email or web address].    Your next appointment:   12 month(s)  The format for your next appointment:   In Person  Provider:   [name], MD      . Medication Adjustments/Labs and Tests Ordered: Current medicines are reviewed at length with the patient today.  Concerns regarding medicines are outlined above.  Orders Placed This  Encounter  Procedures  . Lipid panel   No orders of the defined types were placed in this encounter.  Patient Instructions  Medication Instructions:  No changes *If you need a refill on your cardiac medications before your next appointment, please call your pharmacy*   Lab Work: Your provider would like for you to have the following labs today: fasting lipid  If you have labs (blood work) drawn today and your tests are completely normal, you will receive your results only by: Marland Kitchen MyChart Message (if you have MyChart) OR . A paper copy in the mail If you have any lab test that is abnormal or we need to change your treatment, we will call you to review the results.   Testing/Procedures: None ordered   Follow-Up: At [facility], you and your health needs are our priority.  As part of our continuing mission to provide you with exceptional heart care, we have created designated Provider Care Teams.  These Care Teams include your primary Cardiologist (physician) and Advanced Practice Providers (APPs -  Physician Assistants and Nurse Practitioners) who all work together to provide you with the care you need, when you need it.  We recommend signing up for the patient portal called "MyChart".  Sign up information is provided on this After Visit Summary.  MyChart is used to connect with patients for Virtual Visits (Telemedicine).  Patients are able to view lab/test results, encounter notes, upcoming appointments, etc.  Non-urgent messages can be sent to your provider as well.   To learn more about what you can do with MyChart, go to [email or web address].    Your next appointment:   12 month(s)  The format for your next appointment:   In Person  Provider:   [name], MD       Signed, Michelle Klein, MD  07/31/2020 8:35 AM    [place]

## 2020-08-09 NOTE — Progress Notes (Signed)
Remote pacemaker transmission.   

## 2020-08-14 DIAGNOSIS — Z0000 Encounter for general adult medical examination without abnormal findings: Secondary | ICD-10-CM | POA: Diagnosis not present

## 2020-08-14 DIAGNOSIS — K439 Ventral hernia without obstruction or gangrene: Secondary | ICD-10-CM | POA: Diagnosis not present

## 2020-08-28 ENCOUNTER — Other Ambulatory Visit: Payer: Self-pay

## 2020-08-29 ENCOUNTER — Encounter: Payer: Self-pay | Admitting: Internal Medicine

## 2020-08-29 ENCOUNTER — Ambulatory Visit (INDEPENDENT_AMBULATORY_CARE_PROVIDER_SITE_OTHER): Payer: Medicare HMO | Admitting: Internal Medicine

## 2020-08-29 VITALS — BP 110/70 | HR 58 | Temp 98.0°F | Wt 146.8 lb

## 2020-08-29 DIAGNOSIS — I480 Paroxysmal atrial fibrillation: Secondary | ICD-10-CM

## 2020-08-29 DIAGNOSIS — Z7901 Long term (current) use of anticoagulants: Secondary | ICD-10-CM

## 2020-08-29 DIAGNOSIS — E1159 Type 2 diabetes mellitus with other circulatory complications: Secondary | ICD-10-CM | POA: Diagnosis not present

## 2020-08-29 DIAGNOSIS — E782 Mixed hyperlipidemia: Secondary | ICD-10-CM

## 2020-08-29 DIAGNOSIS — I255 Ischemic cardiomyopathy: Secondary | ICD-10-CM

## 2020-08-29 DIAGNOSIS — Z01818 Encounter for other preprocedural examination: Secondary | ICD-10-CM

## 2020-08-29 DIAGNOSIS — I5042 Chronic combined systolic (congestive) and diastolic (congestive) heart failure: Secondary | ICD-10-CM

## 2020-08-29 DIAGNOSIS — I10 Essential (primary) hypertension: Secondary | ICD-10-CM

## 2020-08-29 NOTE — Progress Notes (Signed)
Established Patient Office Visit     This visit occurred during the SARS-CoV-2 public health emergency.  Safety protocols were in place, including screening questions prior to the visit, additional usage of staff PPE, and extensive cleaning of exam room while observing appropriate contact time as indicated for disinfecting solutions.    CC/Reason for Visit: Preop clearance  HPI: [name] is a 70 y.o. female who is coming in today for the above mentioned reasons. Past Medical History is significant for: Coronary artery disease status post CABG with a biventricular pacer with recovered EF, atrial fibrillation anticoagulated on Xarelto, diabetes, hyperlipidemia.  I last saw her in October 2021.  Since I saw her she has seen her cardiologist.  She had her pacemaker battery exchange in February.  Her EF has recovered nicely.  She has seen a Psychologist, sport and exercise out of [facility] for an umbilical hernia repair.  They are requesting cardiology and internal medicine clearance.  She has already been cleared by cardiology.  She had an A1c done last week that was 7.  She is feeling well and has no complaints.   Past Medical/Surgical History: Past Medical History:  Diagnosis Date   Acute on chronic combined systolic and diastolic CHF, NYHA class 3 ([place]) 03/19/2013   AICD (automatic cardioverter/defibrillator) present 01/2013   Biventricular cardiac pacemaker in situ    Allergic rhinitis    Anogenital warts 01/22/2011   Anxiety state, unspecified 05/13/2013   BREAST CANCER, HX OF 09/19/2009   Qualifier: Diagnosis of  By: [name], [name]    CAD (coronary artery disease)    a. 2004: s/p MI in [place]. No PCI->Medical RX;  b. 07/2012 Cath: LM 30-40, LAD 70p, 70/34m, D1 80-90p, OM1 small 90p, OM2 large 80-90p, 54m, 70-80d, RCA 20-30 diff, EF 40%, glob HK.s/p CABG   Cancer of left breast ([place]) 2002   Patient reports left breast cancer diagnosis in 2002 treated with bilateral mastectomy positive lymph nodes with  left axillary dissection followed by chemotherapy of unknown type   Cataract    Chronic combined systolic and diastolic CHF, NYHA class 2 ([place]) 01/08/2013   Diabetes mellitus type II    Exertional dyspnea 08/05/2014   Exertional shortness of breath    Generalized osteoarthrosis, involving multiple sites 08/25/2015   Hyperlipidemia    Hypertension    Incisional hernia, without obstruction or gangrene 05/04/2015   Ischemic cardiomyopathy    a. 07/2012 Echo: EF 35%, Sev inferoseptal HK, mildly dil LA, Peak PASP 19mmHg.   LBBB (left bundle branch block)    a. intermittent - present during rapid afib 07/2012.   MYOCARDIAL INFARCTION, HX OF 09/19/2009   Qualifier: Diagnosis of  By: [name], Jory Ee    Neuromuscular disorder [facility])    Patient reports chronic numbness in the right foot related to previous surgery on the right leg and "nerve damage"   PAF (paroxysmal atrial fibrillation) ([place])    a. 07/2012: Amio and xarelto initiated.   Right carotid bruit 08/11/2012   SBO (small bowel obstruction) (HCC)    SOB (shortness of breath) 02/26/2018   Type 2 diabetes mellitus with circulatory disorder, without long-term current use of insulin ([place]) 10/03/2015   Vitamin D deficiency 02/22/2016    Past Surgical History:  Procedure Laterality Date   ABDOMINAL HYSTERECTOMY  2000   APPENDECTOMY  1974   BI-VENTRICULAR PACEMAKER INSERTION N/A 01/25/2013   Procedure: BI-VENTRICULAR PACEMAKER INSERTION (CRT-P);  Surgeon: Evans Lance, MD;  Location: [facility] CATH LAB;  Service: Cardiovascular;  Laterality: N/A;   BREAST BIOPSY Left 2002   CARDIAC CATHETERIZATION     CHOLECYSTECTOMY OPEN  1974   CORONARY ARTERY BYPASS GRAFT N/A 09/28/2012   Procedure: CORONARY ARTERY BYPASS GRAFTING (CABG);  Surgeon: [name], MD;  Location: [place];  Service: Open Heart Surgery;  Laterality: N/A;  CABG x four, using left internal mammary artery and left leg greater saphenous vein harvested endoscopically   DILATION AND  CURETTAGE OF UTERUS  1983   EPICARDIAL PACING LEAD PLACEMENT N/A 09/28/2012   Procedure: EPICARDIAL PACING LEAD PLACEMENT;  Surgeon: [name], MD;  Location: [place];  Service: Thoracic;  Laterality: N/A;  LV LEAD PLACEMENT   HERNIA REPAIR     INCISIONAL HERNIA REPAIR N/A 05/25/2015   Procedure: LAPAROSCOPIC INCISIONAL HERNIA WITH MESH ;  Surgeon: [name], MD;  Location: [place];  Service: General;  Laterality: N/A;   INCONTINENCE SURGERY  2000   INSERTION OF MESH N/A 05/25/2015   Procedure: INSERTION OF MESH;  Surgeon: [name], MD;  Location: [place];  Service: General;  Laterality: N/A;   INTRAOPERATIVE TRANSESOPHAGEAL ECHOCARDIOGRAM N/A 09/28/2012   Procedure: INTRAOPERATIVE TRANSESOPHAGEAL ECHOCARDIOGRAM;  Surgeon: [name], MD;  Location: [place];  Service: Open Heart Surgery;  Laterality: N/A;   LAPAROSCOPIC INCISIONAL / UMBILICAL / VENTRAL HERNIA REPAIR  05/25/2015   IHR   LEFT HEART CATHETERIZATION WITH CORONARY ANGIOGRAM N/A 07/20/2012   Procedure: LEFT HEART CATHETERIZATION WITH CORONARY ANGIOGRAM;  Surgeon: [name], MD;  Location[facility] CATH LAB;  Service: Cardiovascular;  Laterality: N/A;   MASTECTOMY Right 2002   MASTECTOMY MODIFIED RADICAL W/ AXILLARY LYMPH NODES W/ OR W/O PECTORALIS MINOR Left 2002   MAZE N/A 09/28/2012   Procedure: MAZE;  Surgeon: [name], MD;  Location: [place];  Service: Open Heart Surgery;  Laterality: N/A;   ORIF WRIST FRACTURE Right 11/08/2017   Procedure: OPEN REDUCTION INTERNAL FIXATION (ORIF) WRIST FRACTURE;  Surgeon: [name], MD;  Location: [place];  Service: Orthopedics;  Laterality: Right;  90 mins   PPM GENERATOR CHANGEOUT N/A 04/17/2020   Procedure: PPM GENERATOR CHANGEOUT;  Surgeon: [name], MD;  Location: [place] CV LAB;  Service: Cardiovascular;  Laterality: N/A;   TENDON REPAIR Right 2001 X 3-4   torn ligaments and tendons in ankle up to knee from work related accident   [place]     Social History:  reports that she quit smoking about 8 years ago. Her smoking use included cigarettes. She has a 10.00 pack-year smoking history. She has never used smokeless tobacco. She reports that she does not drink alcohol and does not use drugs.  Allergies: Allergies  Allergen Reactions   Statins Other (See Comments)    Myalgias with rosuvastatin, atorvastatin and simvastatin     Family History:  Family History  Problem Relation Age of Onset   Kidney failure Mother    Heart disease Mother        Died mid 24P complications of diabetes   Diabetes Mother    Colon cancer Sister    Arthritis Other    Diabetes Other    Hyperlipidemia Other    Ovarian cancer Other    Liver cancer Brother    Stomach cancer Neg Hx    Rectal cancer Neg Hx    Esophageal cancer Neg Hx    Pancreatic cancer Neg Hx      Current Outpatient Medications:    APPLE CIDER VINEGAR  PO, Take 5,220 mg by mouth in the morning, at noon, and at bedtime. 1740 mg each, Disp: , Rfl:    Blood Glucose Monitoring Suppl (TRUE METRIX AIR GLUCOSE METER) DEVI, Check blood sugar 2 times a day, Disp: 1 each, Rfl: 0   carvedilol (COREG) 12.5 MG tablet, TAKE 1 TABLET (12.5 MG) BY MOUTH 2 (TWO) TIMES DAILY WITH A MEAL., Disp: 180 tablet, Rfl: 3   Dulaglutide (TRULICITY) 1.5 BD/5.3GD SOPN, INJECT 1.5MG  (1 PEN) SUBCUTANEOUSLY EVERY WEEK (Patient taking differently: Inject 1.5 mg into the skin every Sunday.), Disp: 18 mL, Rfl: 1   empagliflozin (JARDIANCE) 25 MG TABS tablet, Take 1 tablet (25 mg total) by mouth daily before breakfast., Disp: 90 tablet, Rfl: 3   furosemide (LASIX) 20 MG tablet, TAKE 1 TABLET EVERY DAY (Patient taking differently: Take 20 mg by mouth daily.), Disp: 90 tablet, Rfl: 3   glipiZIDE (GLUCOTROL) 5 MG tablet, TAKE ONE TABLET BY MOUTH TWICE A DAY BEFORE A MEAL (Patient taking differently: Take 5 mg by mouth 2 (two) times daily before a meal.), Disp: 200 tablet, Rfl: 4   glucose blood (TRUE METRIX BLOOD  GLUCOSE TEST) test strip, Use to check blood sugar 2 times a day., Disp: 200 each, Rfl: 12   isosorbide mononitrate (IMDUR) 60 MG 24 hr tablet, TAKE 1 TABLET EVERY DAY, Disp: 90 tablet, Rfl: 1   losartan (COZAAR) 25 MG tablet, TAKE 1 TABLET TWICE DAILY (Patient taking differently: Take 25 mg by mouth 2 (two) times daily.), Disp: 180 tablet, Rfl: 3   metFORMIN (GLUCOPHAGE) 1000 MG tablet, TAKE ONE TABLET BY MOUTH TWICE A DAY WITH FOOD (Patient taking differently: Take 1,000 mg by mouth 2 (two) times daily with a meal.), Disp: 200 tablet, Rfl: 4   nitroGLYCERIN (NITROSTAT) 0.4 MG SL tablet, Place 1 tablet (0.4 mg total) under the tongue every 5 (five) minutes as needed for chest pain., Disp: 25 tablet, Rfl: 3   REPATHA SURECLICK 924 MG/ML SOAJ, INJECT 140 MG INTO THE SKIN EVERY 14 (FOURTEEN) DAYS., Disp: 6 mL, Rfl: 3   TRUEplus Lancets 33G MISC, Use to check blood sugar 2 times a day., Disp: 200 each, Rfl: 12   XARELTO 20 MG TABS tablet, TAKE 1 TABLET (20 MG TOTAL) BY MOUTH DAILY WITH SUPPER., Disp: 90 tablet, Rfl: 3  Review of Systems:  Constitutional: Denies fever, chills, diaphoresis, appetite change and fatigue.  HEENT: Denies photophobia, eye pain, redness, hearing loss, ear pain, congestion, sore throat, rhinorrhea, sneezing, mouth sores, trouble swallowing, neck pain, neck stiffness and tinnitus.   Respiratory: Denies SOB, DOE, cough, chest tightness,  and wheezing.   Cardiovascular: Denies chest pain, palpitations and leg swelling.  Gastrointestinal: Denies nausea, vomiting, abdominal pain, diarrhea, constipation, blood in stool and abdominal distention.  Genitourinary: Denies dysuria, urgency, frequency, hematuria, flank pain and difficulty urinating.  Endocrine: Denies: hot or cold intolerance, sweats, changes in hair or nails, polyuria, polydipsia. Musculoskeletal: Denies myalgias, back pain, joint swelling, arthralgias and gait problem.  Skin: Denies pallor, rash and wound.   Neurological: Denies dizziness, seizures, syncope, weakness, light-headedness, numbness and headaches.  Hematological: Denies adenopathy. Easy bruising, personal or family bleeding history  Psychiatric/Behavioral: Denies suicidal ideation, mood changes, confusion, nervousness, sleep disturbance and agitation    Physical Exam: Vitals:   08/29/20 1046  BP: 110/70  Pulse: (!) 58  Temp: 98 F (36.7 C)  TempSrc: Oral  SpO2: 93%  Weight: 146 lb 12.8 oz (66.6 kg)    Body mass index is 28.67  kg/m.   Constitutional: NAD, calm, comfortable Eyes: PERRL, lids and conjunctivae normal ENMT: Mucous membranes are moist.  Respiratory: clear to auscultation bilaterally, no wheezing, no crackles. Normal respiratory effort. No accessory muscle use.  Cardiovascular: Regular rate and rhythm, no murmurs / rubs / gallops. No extremity edema.  Neurologic: Grossly intact and nonfocal Psychiatric: Normal judgment and insight. Alert and oriented x 3. Normal mood.    Impression and Plan:  Pre-operative clearance  Type 2 diabetes mellitus with other circulatory complication, without long-term current use of insulin (HCC)  Chronic combined systolic and diastolic CHF, NYHA class 2 (HCC)  Ischemic cardiomyopathy  PAF (paroxysmal atrial fibrillation) ([place])  Long term current use of anticoagulant  Essential hypertension  Mixed hyperlipidemia  -I believe it is acceptable for her to proceed to surgery without further clearance.  Per cardiology she had a recent normal stress test, her ejection fraction had recovered per most recent echo after pacemaker implantation.  Her A1c is at 7 which demonstrates good control of her diabetes. -She is on Xarelto for atrial fibrillation so this will need to be discontinued 5 days prior to surgery and resumed as soon as possible post operatively to reduce her risk of stroke.  Time spent: 30 minutes reviewing chart, interviewing and examining patient, filling out  forms and formulating plan of care.     Lelon Frohlich, MD [facility] at [facility]

## 2020-09-13 ENCOUNTER — Encounter: Payer: Self-pay | Admitting: Internal Medicine

## 2020-09-19 ENCOUNTER — Telehealth: Payer: Self-pay | Admitting: *Deleted

## 2020-09-19 NOTE — Chronic Care Management (AMB) (Signed)
  Chronic Care Management   Outreach Note  09/19/2020 Name: Shawniece Oyola MRN: [id number] DOB: [birth year]  Kaydance Bowie is a 70 y.o. year old female who is a primary care patient of Isaac Bliss, Rayford Halsted, MD. I reached out to Marciano Sequin by phone today in response to a referral sent by Ms. Aylin Toor's PCP, Isaac Bliss, Rayford Halsted, MD      An unsuccessful telephone outreach was attempted today. The patient was referred to the case management team for assistance with care management and care coordination.   Follow Up Plan: The care management team will reach out to the patient again over the next 7 days.  If patient returns call to provider office, please advise to call [facility] at [phone].  [facility]

## 2020-09-22 ENCOUNTER — Ambulatory Visit: Payer: Medicare HMO | Attending: Critical Care Medicine

## 2020-09-22 DIAGNOSIS — Z20822 Contact with and (suspected) exposure to covid-19: Secondary | ICD-10-CM

## 2020-09-23 LAB — NOVEL CORONAVIRUS, NAA: SARS-CoV-2, NAA: NOT DETECTED

## 2020-09-23 LAB — SARS-COV-2, NAA 2 DAY TAT

## 2020-09-25 ENCOUNTER — Other Ambulatory Visit: Payer: Medicare HMO

## 2020-09-26 NOTE — Chronic Care Management (AMB) (Signed)
Chronic Care Management   Note  09/26/2020 Name: [name] MRN: [id number] DOB: 02-Dec-1950  [name] is a 70 y.o. year old female who is a primary care patient of Philip Aspen, Limmie Patricia, MD. I reached out to [name] by phone today in response to a referral sent by Ms. Jailyn Monier's PCP, Philip Aspen, Limmie Patricia, MD      Ms. Hires was given information about Chronic Care Management services today including:  CCM service includes personalized support from designated clinical staff supervised by her physician, including individualized plan of care and coordination with other care providers 24/7 contact phone numbers for assistance for urgent and routine care needs. Service will only be billed when office clinical staff spend 20 minutes or more in a month to coordinate care. Only one practitioner may furnish and bill the service in a calendar month. The patient may stop CCM services at any time (effective at the end of the month) by phone call to the office staff. The patient will be responsible for cost sharing (co-pay) of up to 20% of the service fee (after annual deductible is met).  Patient agreed to services and verbal consent obtained.   Follow up plan: Telephone appointment with care management team member scheduled for:10/24/20  [facility] Guide, Embedded Care Coordination [facility] Management

## 2020-09-28 DIAGNOSIS — Z79899 Other long term (current) drug therapy: Secondary | ICD-10-CM | POA: Diagnosis not present

## 2020-09-28 DIAGNOSIS — K660 Peritoneal adhesions (postprocedural) (postinfection): Secondary | ICD-10-CM | POA: Diagnosis not present

## 2020-09-28 DIAGNOSIS — Z9189 Other specified personal risk factors, not elsewhere classified: Secondary | ICD-10-CM | POA: Diagnosis not present

## 2020-09-28 DIAGNOSIS — I4891 Unspecified atrial fibrillation: Secondary | ICD-10-CM | POA: Diagnosis not present

## 2020-09-28 DIAGNOSIS — Z950 Presence of cardiac pacemaker: Secondary | ICD-10-CM | POA: Diagnosis not present

## 2020-09-28 DIAGNOSIS — E119 Type 2 diabetes mellitus without complications: Secondary | ICD-10-CM | POA: Diagnosis not present

## 2020-09-28 DIAGNOSIS — K430 Incisional hernia with obstruction, without gangrene: Secondary | ICD-10-CM | POA: Diagnosis not present

## 2020-09-28 DIAGNOSIS — Z7189 Other specified counseling: Secondary | ICD-10-CM | POA: Diagnosis not present

## 2020-09-28 DIAGNOSIS — Z9889 Other specified postprocedural states: Secondary | ICD-10-CM | POA: Diagnosis not present

## 2020-09-28 DIAGNOSIS — I2510 Atherosclerotic heart disease of native coronary artery without angina pectoris: Secondary | ICD-10-CM | POA: Diagnosis not present

## 2020-09-28 DIAGNOSIS — G4700 Insomnia, unspecified: Secondary | ICD-10-CM | POA: Diagnosis not present

## 2020-09-28 DIAGNOSIS — K439 Ventral hernia without obstruction or gangrene: Secondary | ICD-10-CM | POA: Diagnosis not present

## 2020-09-28 DIAGNOSIS — I252 Old myocardial infarction: Secondary | ICD-10-CM | POA: Diagnosis not present

## 2020-09-28 DIAGNOSIS — K5909 Other constipation: Secondary | ICD-10-CM | POA: Diagnosis not present

## 2020-09-28 DIAGNOSIS — G8918 Other acute postprocedural pain: Secondary | ICD-10-CM | POA: Diagnosis not present

## 2020-09-28 DIAGNOSIS — Z0189 Encounter for other specified special examinations: Secondary | ICD-10-CM | POA: Diagnosis not present

## 2020-09-29 DIAGNOSIS — Z9189 Other specified personal risk factors, not elsewhere classified: Secondary | ICD-10-CM | POA: Diagnosis not present

## 2020-09-29 DIAGNOSIS — Z0189 Encounter for other specified special examinations: Secondary | ICD-10-CM | POA: Diagnosis not present

## 2020-09-29 DIAGNOSIS — G8918 Other acute postprocedural pain: Secondary | ICD-10-CM | POA: Diagnosis not present

## 2020-09-29 DIAGNOSIS — Z9889 Other specified postprocedural states: Secondary | ICD-10-CM | POA: Diagnosis not present

## 2020-09-29 DIAGNOSIS — K439 Ventral hernia without obstruction or gangrene: Secondary | ICD-10-CM | POA: Diagnosis not present

## 2020-09-29 DIAGNOSIS — Z7189 Other specified counseling: Secondary | ICD-10-CM | POA: Diagnosis not present

## 2020-09-29 DIAGNOSIS — Z79899 Other long term (current) drug therapy: Secondary | ICD-10-CM | POA: Diagnosis not present

## 2020-09-30 DIAGNOSIS — Z9889 Other specified postprocedural states: Secondary | ICD-10-CM | POA: Diagnosis not present

## 2020-10-11 ENCOUNTER — Other Ambulatory Visit: Payer: Self-pay | Admitting: Cardiovascular Disease

## 2020-10-11 DIAGNOSIS — Z0000 Encounter for general adult medical examination without abnormal findings: Secondary | ICD-10-CM | POA: Diagnosis not present

## 2020-10-11 DIAGNOSIS — Z8719 Personal history of other diseases of the digestive system: Secondary | ICD-10-CM | POA: Diagnosis not present

## 2020-10-11 DIAGNOSIS — Z9889 Other specified postprocedural states: Secondary | ICD-10-CM | POA: Diagnosis not present

## 2020-10-17 ENCOUNTER — Ambulatory Visit (INDEPENDENT_AMBULATORY_CARE_PROVIDER_SITE_OTHER): Payer: Medicare HMO

## 2020-10-17 DIAGNOSIS — I5042 Chronic combined systolic (congestive) and diastolic (congestive) heart failure: Secondary | ICD-10-CM | POA: Diagnosis not present

## 2020-10-17 LAB — CUP PACEART REMOTE DEVICE CHECK
Battery Remaining Longevity: 77 mo
Battery Remaining Percentage: 93 %
Battery Voltage: 2.99 V
Brady Statistic AP VP Percent: 1 %
Brady Statistic AP VS Percent: 1 %
Brady Statistic AS VP Percent: 99 %
Brady Statistic AS VS Percent: 1 %
Brady Statistic RA Percent Paced: 1 %
Date Time Interrogation Session: 20220809025454
Implantable Lead Implant Date: 20140721
Implantable Lead Implant Date: 20141117
Implantable Lead Implant Date: 20141117
Implantable Lead Location: 753858
Implantable Lead Location: 753859
Implantable Lead Location: 753860
Implantable Lead Model: 5071
Implantable Pulse Generator Implant Date: 20220207
Lead Channel Impedance Value: 400 Ohm
Lead Channel Impedance Value: 400 Ohm
Lead Channel Impedance Value: 560 Ohm
Lead Channel Pacing Threshold Amplitude: 0.5 V
Lead Channel Pacing Threshold Amplitude: 0.75 V
Lead Channel Pacing Threshold Amplitude: 1.5 V
Lead Channel Pacing Threshold Pulse Width: 0.4 ms
Lead Channel Pacing Threshold Pulse Width: 0.4 ms
Lead Channel Pacing Threshold Pulse Width: 0.5 ms
Lead Channel Sensing Intrinsic Amplitude: 0.5 mV
Lead Channel Sensing Intrinsic Amplitude: 12 mV
Lead Channel Setting Pacing Amplitude: 2 V
Lead Channel Setting Pacing Amplitude: 2.5 V
Lead Channel Setting Pacing Amplitude: 2.5 V
Lead Channel Setting Pacing Pulse Width: 0.4 ms
Lead Channel Setting Pacing Pulse Width: 0.5 ms
Lead Channel Setting Sensing Sensitivity: 2 mV
Pulse Gen Model: 3222
Pulse Gen Serial Number: 3859407

## 2020-10-24 ENCOUNTER — Ambulatory Visit (INDEPENDENT_AMBULATORY_CARE_PROVIDER_SITE_OTHER): Payer: Medicare HMO

## 2020-10-24 DIAGNOSIS — I5042 Chronic combined systolic (congestive) and diastolic (congestive) heart failure: Secondary | ICD-10-CM | POA: Diagnosis not present

## 2020-10-24 DIAGNOSIS — E1159 Type 2 diabetes mellitus with other circulatory complications: Secondary | ICD-10-CM

## 2020-10-24 DIAGNOSIS — I10 Essential (primary) hypertension: Secondary | ICD-10-CM

## 2020-10-24 DIAGNOSIS — E782 Mixed hyperlipidemia: Secondary | ICD-10-CM | POA: Diagnosis not present

## 2020-10-24 DIAGNOSIS — I480 Paroxysmal atrial fibrillation: Secondary | ICD-10-CM | POA: Diagnosis not present

## 2020-10-24 NOTE — Patient Instructions (Signed)
Visit Information   PATIENT GOALS:   Goals Addressed             This Visit's Progress    RNCM:Monitor and Manage My Blood Sugar-Diabetes Type 2   On track    Timeframe:  Long-Range Goal Priority:  High Start Date:    10/24/20                         Expected End Date:    03/11/21                   Follow Up Date 12/07/20    - check blood sugar at prescribed times - check blood sugar before and after exercise - check blood sugar if I feel it is too high or too low - enter blood sugar readings and medication or insulin into daily log - take the blood sugar log to all doctor visits - take the blood sugar meter to all doctor visits    Why is this important?   Checking your blood sugar at home helps to keep it from getting very high or very low.  Writing the results in a diary or log helps the doctor know how to care for you.  Your blood sugar log should have the time, date and the results.  Also, write down the amount of insulin or other medicine that you take.  Other information, like what you ate, exercise done and how you were feeling, will also be helpful.     Notes:      RNCM:Track and Manage My Blood Pressure-Hypertension   On track    Timeframe:  Long-Range Goal Priority:  Medium Start Date:     10/24/20                        Expected End Date:    03/11/21                   Follow Up Date 12/07/20    - check blood pressure weekly - choose a place to take my blood pressure (home, clinic or office, retail store) - write blood pressure results in a log or diary    Why is this important?   You won't feel high blood pressure, but it can still hurt your blood vessels.  High blood pressure can cause heart or kidney problems. It can also cause a stroke.  Making lifestyle changes like losing a little weight or eating less salt will help.  Checking your blood pressure at home and at different times of the day can help to control blood pressure.  If the doctor prescribes medicine  remember to take it the way the doctor ordered.  Call the office if you cannot afford the medicine or if there are questions about it.     Notes:      RNCM:Track and Manage Symptoms-Heart Failure   On track    Timeframe:  Long-Range Goal Priority:  Medium Start Date:     10/24/20                        Expected End Date:    03/11/21                   Follow Up Date 12/07/20    - develop a rescue plan - eat more whole grains, fruits and vegetables, lean meats and healthy fats -  follow rescue plan if symptoms flare-up - know when to call the doctor - track symptoms and what helps feel better or worse - dress right for the weather, hot or cold    Why is this important?   You will be able to handle your symptoms better if you keep track of them.  Making some simple changes to your lifestyle will help.  Eating healthy is one thing you can do to take good care of yourself.    Notes:       Heart Failure Action Plan A heart failure action plan helps you understand what to do when you have symptoms of heart failure. Your action plan is a color-coded plan that lists the symptoms to watch for and indicates what actions to take. If you have symptoms in the red zone, you need medical care right away. If you have symptoms in the yellow zone, you are having problems. If you have symptoms in the green zone, you are doing well. Follow the plan that was created by you and your health care provider. Reviewyour plan each time you visit your health care provider. Red zone These signs and symptoms mean you should get medical help right away: You have trouble breathing when resting. You have a dry cough that is getting worse. You have swelling or pain in your legs or abdomen that is getting worse. You suddenly gain more than 2-3 lb (0.9-1.4 kg) in 24 hours, or more than 5 lb (2.3 kg) in a week. This amount may be more or less depending on your condition. You have trouble staying awake or you feel  confused. You have chest pain. You do not have an appetite. You pass out. You have worsening sadness or depression. If you have any of these symptoms, call your local emergency services (911 in the [place] right away. Do not drive yourself to the hospital. Yellow zone These signs and symptoms mean your condition may be getting worse and you should make some changes: You have trouble breathing when you are active, or you need to sleep with your head raised on extra pillows to help you breathe. You have swelling in your legs or abdomen. You gain 2-3 lb (0.9-1.4 kg) in 24 hours, or 5 lb (2.3 kg) in a week. This amount may be more or less depending on your condition. You get tired easily. You have trouble sleeping. You have a dry cough. If you have any of these symptoms: Contact your health care provider within the next day. Your health care provider may adjust your medicines. Green zone These signs mean you are doing well and can continue what you are doing: You do not have shortness of breath. You have very little swelling or no new swelling. Your weight is stable (no gain or loss). You have a normal activity level. You do not have chest pain or any other new symptoms. Follow these instructions at home: Take over-the-counter and prescription medicines only as told by your health care provider. Weigh yourself daily. Your target weight is __________ lb (__________ kg). Call your health care provider if you gain more than ____3______ lb (__________ kg) in 24 hours, or more than ___5_______ lb (__________ kg) in a week. Health care provider name: _____________________________________________________ Health care provider phone number: _____________________________________________________ Eat a heart-healthy diet. Work with a diet and nutrition specialist (dietitian) to create an eating plan that is best for you. Keep all follow-up visits. This is important. Where to find more  information American  Heart Association: www.heart.org Summary A heart failure action plan helps you understand what to do when you have symptoms of heart failure. Follow the action plan that was created by you and your health care provider. Get help right away if you have any symptoms in the red zone. This information is not intended to replace advice given to you by your health care provider. Make sure you discuss any questions you have with your healthcare provider. Document Revised: 10/11/2019 Document Reviewed: 10/11/2019 Elsevier Patient Education  2022 [place]. Type 2 Diabetes Mellitus, Self-Care, Adult When you have type 2 diabetes (type 2 diabetes mellitus), you must make sure your blood sugar (glucose) stays in a healthy range. You can do this with: Nutrition. Exercise. Lifestyle changes. Medicines or insulin, if needed. Support from your doctors and others. What are the risks? Having diabetes can raise your risk for other long-term (chronic) health problems. You may get medicines to help prevent these problems. How to stay aware of blood sugar  Check your blood sugar level every day, as often as told. Have your A1C (hemoglobin A1C) level checked two or more times a year. Have it checked more often if told. Your doctor will set personal treatment goals for you. In general, you should have these blood sugar levels: Before meals: 80-130 mg/dL (4.4-7.2 mmol/L). After meals: below 180 mg/dL (10 mmol/L). A1C: less than 7%. How to manage high and low blood sugar Symptoms of high blood sugar High blood sugar is also called hyperglycemia. Know the symptoms of high blood sugar. These may include: More thirst. Hunger. Feeling very tired. Needing to pee (urinate) more often than normal. Seeing things blurry. Symptoms of low blood sugar Low blood sugar is also called hypoglycemia. This is when blood sugar is at or below 70 mg/dL (3.9 mmol/L). Symptoms may  include: Hunger. Feeling worried or nervous (anxious). Feeling sweaty and cold to the touch (clammy). Being dizzy or light-headed. Feeling sleepy. A fast heartbeat. Feeling grouchy (irritable). Tingling or loss of feeling (numbness) around your mouth, lips, or tongue. Restless sleep. Diabetes medicines can cause low blood sugar. You are more at risk: While you exercise. After exercise. During sleep. When you are sick. When you skip meals or do not eat for a long time. Treating low blood sugar If you think you have low blood sugar, eat or drink something sugary right away. Keep 15 grams of a fast-acting carb (carbohydrate) with you all the time. Make sure your family and friends know how to treatyou if you cannot treat yourself. Treating very low blood sugar Severe hypoglycemia is when your blood sugar is at or below 54 mg/dL (3 mmol/L). Severe hypoglycemia is an emergency. Do not wait to see if the symptoms will go away. Get medical help right away. Call your local emergency services (911 in the [place] Do not drive yourself to the hospital. You may need a glucagon shot if you have very low blood sugar and you cannot eat or drink. Have a family member or friend learn how to check your blood sugar and how to give you a glucagon shot. Ask your doctor if you should have akit for glucagon shots. Follow these instructions at home: Medicines Take diabetes medicines as told. If your doctor prescribed insulin or diabetes medicines, take them each day. Do not run out of insulin or other medicines. Plan ahead. If you use insulin, change the amount you take based on how active you are and what foods you eat. Your doctor  will tell you how to do this. Take over-the-counter and prescription medicines only as told by your doctor. Eating and drinking  Eat healthy foods. These include: Low-fat (lean) proteins. Complex carbs, such as whole grains. Fresh fruits and vegetables. Low-fat dairy  products. Healthy fats. Meet with a food expert (dietitian) to make an eating plan. Follow instructions from your doctor about what you cannot eat or drink. Drink enough fluid to keep your pee (urine) pale yellow. Keep track of carbs that you eat. Read food labels and learn serving sizes of foods. Follow your sick-day plan when you cannot eat or drink as normal. Make this plan with your doctor so it is ready to use.  Activity Exercise as told by your doctor. You may need to: Do stretching and strength exercises 2 or more times a week. Do 150 minutes or more of exercise each week that makes your heart beat faster and makes you sweat. Spread out your exercise over 3 or more days a week. Do not go more than 2 days in a row without exercise. Talk with your doctor before you start a new exercise. Your doctor may tell you to change: How much insulin or medicines you take. How much food you eat. Lifestyle Do not use any products that contain nicotine or tobacco, such as cigarettes, e-cigarettes, and chewing tobacco. If you need help quitting, ask your doctor. If you drink alcohol and your doctor says alcohol is safe for you: Limit how much you use to: 0-1 drink a day for women who are not pregnant. 0-2 drinks a day for men. Be aware of how much alcohol is in your drink. In the [place] one drink equals one 12 oz bottle of beer (355 mL), one 5 oz glass of wine (148 mL), or one 1 oz glass of hard liquor (44 mL). Learn to deal with stress. If you need help, ask your doctor. Body care  Stay up to date with your shots (immunizations). Have your eyes and feet checked by a doctor as often as told. Check your skin and feet every day. Check for cuts, bruises, redness, blisters, or sores. Brush your teeth and gums two times a day. Floss one or more times a day. Go to the dentist one or more times every 6 months. Stay at a healthy weight.  General instructions Share your diabetes care plan with: Your  work or school. People you live with. Carry a card or wear jewelry that says you have diabetes. Keep all follow-up visits as told by your doctor. This is important. Questions to ask your doctor Do I need to meet with a certified expert in diabetes education and care? Where can I find a support group? Where to find more information American Diabetes Association: www.diabetes.org American Association of Diabetes Care and Education Specialists: www.diabeteseducator.org International Diabetes Federation: MemberVerification.ca Summary When you have type 2 diabetes, you must make sure your blood sugar (glucose) stays in a healthy range. You can do this with nutrition, exercise, medicines and insulin, and support from doctors and others. Check your blood sugar every day, or as often as told. Having diabetes can raise your risk for other long-term health problems. You may get medicines to help prevent these problems. Share your diabetes management plan with people at work, school, and home. Keep all follow-up visits as told by your doctor. This is important. This information is not intended to replace advice given to you by your health care provider. Make sure you discuss any  questions you have with your healthcare provider. Document Revised: 02/09/2020 Document Reviewed: 09/29/2019 Elsevier Patient Education  2022 [employer].   Consent to CCM Services: Ms. Parilla was given information about Chronic Care Management services including:  CCM service includes personalized support from designated clinical staff supervised by her physician, including individualized plan of care and coordination with other care providers 24/7 contact phone numbers for assistance for urgent and routine care needs. Service will only be billed when office clinical staff spend 20 minutes or more in a month to coordinate care. Only one practitioner may furnish and bill the service in a calendar month. The patient may stop CCM  services at any time (effective at the end of the month) by phone call to the office staff. The patient will be responsible for cost sharing (co-pay) of up to 20% of the service fee (after annual deductible is met).  Patient agreed to services and verbal consent obtained.   Patient verbalizes understanding of instructions provided today and agrees to view in [place].   Telephone follow up appointment with care management team member scheduled for:  Peter Garter RN, [facility], CDE Care Management Coordinator [facility] [phone], Mobile [phone]   CLINICAL CARE PLAN: Patient Care Plan: Cardiovascular disease Management (CHF, AF,HTN and HLD)     Problem Identified: Lack of long term self management of Cardiovascular disease  (CHF, AF,HTN and HLD)   Priority: High     Long-Range Goal: Effective  long term self management of Cardiovascular disease  (CHF, AF,HTN and HLD)   Start Date: 10/24/2020  Expected End Date: 03/11/2021  This Visit's Progress: On track  Priority: High  Note:   Current Barriers:  Knowledge deficits related to basic Cardiovascular disease  (CHF, AF,HTN and HLD) pathophysiology and self care management Unable to independently self manage  Cardiovascular disease  (CHF, AF,HTN and HLD) Pt states that she weights daily and her weight stays within 3 lbs of her goal of 146.  States she checks her B/P about once a week.  Denies any shortness of breath, swelling or chest pains.  States she has not had any atrial fib since February.States she tries to follow a low sodium diet.  States she has not been able to exercise since she had her hernia surgery in July.  States she will see her surgeon in a few weeks to be cleared to return to normal activity Nurse Case Manager Clinical Goal(s):  patient will weigh self daily and record patient will verbalize understanding of Heart Failure Action Plan and when to call doctor patient will take all Heart  Failure mediations as prescribed Interventions:  Collaboration with Isaac Bliss, Rayford Halsted, MD regarding development and update of comprehensive plan of care as evidenced by provider attestation and co-signature Inter-disciplinary care team collaboration (see longitudinal plan of care) Basic overview and discussion of pathophysiology of Cardiovascular disease  (CHF, AF,HTN and HLD)  Provided written and verbal education on low sodium diet Reviewed Heart Failure Action Plan in depth and provided written copy Assessed for scales in home-has scale Discussed importance of daily weight Reviewed role of diuretics in prevention of fluid overload Provided education to patient re: stroke prevention, s/s of heart attack and stroke, DASH diet, complications of uncontrolled blood pressure Advised patient, providing education and rationale, to monitor blood pressure once a week and record, calling PCP for findings outside established parameters.  Reviewed importance of taking anticoagulant to prevent stroke with atrial fibrillation  [employer]  calendar  Self-Care Activities:  Takes Heart Failure Medications as prescribed Weighs daily and record (notifying MD of 3 lb weight gain over night or 5 lb in a week) Verbalizes understanding of and follows CHF Action Plan Adheres to low sodium diet  Patient Goals:  - Take Heart Failure Medications as prescribed - Weigh daily and record (notify MD with 3 lb weight gain over night or 5 lb in a week) - Follow CHF Action Plan - Adhere to low sodium diet - develop a rescue plan - eat more whole grains, fruits and vegetables, lean meats and healthy fats - follow rescue plan if symptoms flare-up - know when to call the doctor - track symptoms and what helps feel better or worse - dress right for the weather, hot or cold - make an activity or exercise plan - pace activity allowing for rest - track symptoms during activity in diary - warm up  and cool down for 10 minutes before and after exercise - keep legs up while sitting - track weight in diary - use salt in moderation - watch for swelling in feet, ankles and legs every day - check blood pressure weekly - choose a place to take my blood pressure (home, clinic or office, retail store) - write blood pressure results in a log or diary Follow Up Plan: Telephone follow up appointment with care management team member scheduled for: 12/07/20 at 9 AM The patient has been provided with contact information for the care management team and has been advised to call with any health related questions or concerns.      Patient Care Plan: Diabetes Type 2 (Adult)     Problem Identified: Need for long term self management of Type 2 Diabetes   Priority: Medium     Long-Range Goal: Effective long term self management of Type 2 Diabetes   Start Date: 10/24/2020  Expected End Date: 03/11/2021  This Visit's Progress: On track  Priority: Medium  Note:   Objective:  Lab Results  Component Value Date   HGBA1C 7.0 06/29/2020   Lab Results  Component Value Date   CREATININE 0.73 04/10/2020   CREATININE 0.73 09/17/2019   CREATININE 0.69 07/21/2019   No results found for: EGFR Current Barriers:  Knowledge Deficits related to basic Diabetes pathophysiology and self care/management Knowledge Deficits related to medications used for management of diabetes Unable to independently self manage Type 2 diabetes States she checks her CBG about every other day.  States her morning readings are higher and range 130-141 and if she checks later in the day it is in the 90's.  Denies any low readings.  States she tries to eat healthy and mostly cooks at home.  States she likes to walk on the treadmill but has not been able to exercise due to her hernia and she is recovering from her hernia repair. Case Manager Clinical Goal(s):  patient will demonstrate improved adherence to prescribed treatment plan for  diabetes self care/management as evidenced by: daily monitoring and recording of CBG  adherence to ADA/ carb modified diet adherence to prescribed medication regimen contacting provider for new or worsened symptoms or questions Interventions:  Collaboration with Isaac Bliss, Rayford Halsted, MD regarding development and update of comprehensive plan of care as evidenced by provider attestation and co-signature Inter-disciplinary care team collaboration (see longitudinal plan of care) Provided education to patient about basic DM disease process Reviewed medications with patient and discussed importance of medication adherence Discussed plans with patient for ongoing  care management follow up and provided patient with direct contact information for care management team Provided patient with written educational materials related to hypo and hyperglycemia and importance of correct treatment Reviewed scheduled/upcoming provider appointments including: no upcoming primary care Advised patient, providing education and rationale, to check cbg daily and record, calling provider for findings outside established parameters.   Review of patient status, including review of consultants reports, relevant laboratory and other test results, and medications completed. Reviewed importance of annual eye exams and to call to schedule her annual exam Self-Care Activities - Self administers oral medications as prescribed Self administers injectable DM medication (Trulicity) as prescribed Attends all scheduled provider appointments Checks blood sugars as prescribed and utilize hyper and hypoglycemia protocol as needed Adheres to prescribed ADA/carb modified Patient Goals: - check blood sugar at prescribed times - check blood sugar before and after exercise - check blood sugar if I feel it is too high or too low - enter blood sugar readings and medication or insulin into daily log - take the blood sugar log to all doctor  visits - change to whole grain breads, cereal, pasta - drink 6 to 8 glasses of water each day - fill half of plate with vegetables - limit fast food meals to no more than 1 per week - manage portion size - read food labels for fat, fiber, carbohydrates and portion size - switch to low-fat or skim milk - switch to sugar-free drinks - schedule appointment with eye doctor - check feet daily for cuts, sores or redness - keep feet up while sitting - wash and dry feet carefully every day - wear comfortable, cotton socks - wear comfortable, well-fitting shoes Follow Up Plan: Telephone follow up appointment with care management team member scheduled for: 12/07/20 at 9 AM The patient has been provided with contact information for the care management team and has been advised to call with any health related questions or concerns.

## 2020-10-24 NOTE — Chronic Care Management (AMB) (Signed)
Chronic Care Management   CCM RN Visit Note  10/24/2020 Name: [name] MRN: [id number] DOB: 04-Feb-1951  Subjective: [name] is a 70 y.o. year old female who is a primary care patient of [name], Michelle Halsted, MD. The care management team was consulted for assistance with disease management and care coordination needs.    Engaged with patient by telephone for initial visit in response to provider referral for case management and/or care coordination services.   Consent to Services:  The patient was given the following information about Chronic Care Management services today, agreed to services, and gave verbal consent: 1. CCM service includes personalized support from designated clinical staff supervised by the primary care provider, including individualized plan of care and coordination with other care providers 2. 24/7 contact phone numbers for assistance for urgent and routine care needs. 3. Service will only be billed when office clinical staff spend 20 minutes or more in a month to coordinate care. 4. Only one practitioner may furnish and bill the service in a calendar month. 5.The patient may stop CCM services at any time (effective at the end of the month) by phone call to the office staff. 6. The patient will be responsible for cost sharing (co-pay) of up to 20% of the service fee (after annual deductible is met). Patient agreed to services and consent obtained.  Patient agreed to services and verbal consent obtained.   Assessment: Review of patient past medical history, allergies, medications, health status, including review of consultants reports, laboratory and other test data, was performed as part of comprehensive evaluation and provision of chronic care management services.   SDOH (Social Determinants of Health) assessments and interventions performed:  SDOH Interventions    Flowsheet Row Most Recent Value  SDOH Interventions   Food Insecurity Interventions  Intervention Not Indicated  Financial Strain Interventions Intervention Not Indicated  Housing Interventions Intervention Not Indicated  Physical Activity Interventions Other (Comments)  [reviewed insurance fitness benefit]  Stress Interventions Intervention Not Indicated  Transportation Interventions Intervention Not Indicated        CCM Care Plan  Allergies  Allergen Reactions   Statins Other (See Comments)    Myalgias with rosuvastatin, atorvastatin and simvastatin     Outpatient Encounter Medications as of 10/24/2020  Medication Sig   APPLE CIDER VINEGAR PO Take 5,220 mg by mouth in the morning, at noon, and at bedtime. 1740 mg each   Blood Glucose Monitoring Suppl (TRUE METRIX AIR GLUCOSE METER) DEVI Check blood sugar 2 times a day   carvedilol (COREG) 12.5 MG tablet TAKE 1 TABLET (12.5 MG) BY MOUTH 2 (TWO) TIMES DAILY WITH A MEAL.   Dulaglutide (TRULICITY) 1.5 UT/6.5YY SOPN INJECT 1.5MG (1 PEN) SUBCUTANEOUSLY EVERY WEEK (Patient taking differently: Inject 1.5 mg into the skin every Sunday.)   empagliflozin (JARDIANCE) 25 MG TABS tablet Take 1 tablet (25 mg total) by mouth daily before breakfast.   furosemide (LASIX) 20 MG tablet TAKE 1 TABLET EVERY DAY (Patient taking differently: Take 20 mg by mouth daily.)   glipiZIDE (GLUCOTROL) 5 MG tablet TAKE ONE TABLET BY MOUTH TWICE A DAY BEFORE A MEAL (Patient taking differently: Take 5 mg by mouth 2 (two) times daily before a meal.)   glucose blood (TRUE METRIX BLOOD GLUCOSE TEST) test strip Use to check blood sugar 2 times a day.   isosorbide mononitrate (IMDUR) 60 MG 24 hr tablet TAKE 1 TABLET EVERY DAY   losartan (COZAAR) 25 MG tablet TAKE 1 TABLET TWICE DAILY (Patient  taking differently: Take 25 mg by mouth 2 (two) times daily.)   metFORMIN (GLUCOPHAGE) 1000 MG tablet TAKE ONE TABLET BY MOUTH TWICE A DAY WITH FOOD (Patient taking differently: Take 1,000 mg by mouth 2 (two) times daily with a meal.)   nitroGLYCERIN (NITROSTAT) 0.4 MG  SL tablet Place 1 tablet (0.4 mg total) under the tongue every 5 (five) minutes as needed for chest pain.   REPATHA SURECLICK 161 MG/ML SOAJ INJECT 140 MG INTO THE SKIN EVERY 14 (FOURTEEN) DAYS.   TRUEplus Lancets 33G MISC Use to check blood sugar 2 times a day.   XARELTO 20 MG TABS tablet TAKE 1 TABLET (20 MG TOTAL) BY MOUTH DAILY WITH SUPPER.   No facility-administered encounter medications on file as of 10/24/2020.    Patient Active Problem List   Diagnosis Date Noted   Pacemaker battery depletion 04/17/2020   SOB (shortness of breath) 02/26/2018   Vitamin D deficiency 02/22/2016   Long term current use of anticoagulant 11/10/2015   Type 2 diabetes mellitus with circulatory disorder, without long-term current use of insulin ([place]) 10/03/2015   Chronic pain disorder 10/03/2015   Generalized osteoarthrosis, involving multiple sites 08/25/2015   Other fatigue 08/11/2015   Back pain, chronic 08/11/2015   Incisional hernia 05/25/2015   Incisional hernia, without obstruction or gangrene 05/04/2015   Exertional dyspnea 08/05/2014   Routine general medical examination at a health care facility 04/14/2014   Anxiety state, unspecified 05/13/2013   Acute on chronic combined systolic and diastolic CHF, NYHA class 3 ([place]) 03/19/2013   High anion gap metabolic acidosis 09/60/4540   Biventricular cardiac pacemaker - [employer] Jude, Nov 2014 03/04/2013   Chronic combined systolic and diastolic CHF, NYHA class 2 ([place]) 01/08/2013   S/P CABG x 4: 09/28/12 (LIMA-LAD, SVG-OM1-OM2, SVG-Intermediate) 09/29/2012   Right carotid bruit 08/11/2012   PAF (paroxysmal atrial fibrillation) ([place]) 07/21/2012   Ischemic cardiomyopathy    CAD (coronary artery disease)    Anogenital warts 01/22/2011   Hyperlipidemia 09/19/2009   Essential hypertension 09/19/2009   MYOCARDIAL INFARCTION, HX OF 09/19/2009   Allergic rhinitis 09/19/2009   BREAST CANCER, HX OF 09/19/2009    Conditions to be addressed/monitored:Atrial  Fibrillation, CHF, CAD, HTN, HLD, and DMII  Care Plan : Cardiovascular disease Management (CHF, AF,HTN and HLD)  Updates made by Dimitri Ped, RN since 10/24/2020 12:00 AM     Problem: Lack of long term self management of Cardiovascular disease  (CHF, AF,HTN and HLD)   Priority: High     Long-Range Goal: Effective  long term self management of Cardiovascular disease  (CHF, AF,HTN and HLD)   Start Date: 10/24/2020  Expected End Date: 03/11/2021  This Visit's Progress: On track  Priority: High  Note:   Current Barriers:  Knowledge deficits related to basic Cardiovascular disease  (CHF, AF,HTN and HLD) pathophysiology and self care management Unable to independently self manage  Cardiovascular disease  (CHF, AF,HTN and HLD) Pt states that she weights daily and her weight stays within 3 lbs of her goal of 146.  States she checks her B/P about once a week.  Denies any shortness of breath, swelling or chest pains.  States she has not had any atrial fib since February.States she tries to follow a low sodium diet.  States she has not been able to exercise since she had her hernia surgery in July.  States she will see her surgeon in a few weeks to be cleared to return to normal activity Nurse Case Manager  Clinical Goal(s):  patient will weigh self daily and record patient will verbalize understanding of Heart Failure Action Plan and when to call doctor patient will take all Heart Failure mediations as prescribed Interventions:  Collaboration with [name], Michelle Halsted, MD regarding development and update of comprehensive plan of care as evidenced by provider attestation and co-signature Inter-disciplinary care team collaboration (see longitudinal plan of care) Basic overview and discussion of pathophysiology of Cardiovascular disease  (CHF, AF,HTN and HLD)  Provided written and verbal education on low sodium diet Reviewed Heart Failure Action Plan in depth and provided written  copy Assessed for scales in home-has scale Discussed importance of daily weight Reviewed role of diuretics in prevention of fluid overload Provided education to patient re: stroke prevention, s/s of heart attack and stroke, DASH diet, complications of uncontrolled blood pressure Advised patient, providing education and rationale, to monitor blood pressure once a week and record, calling PCP for findings outside established parameters.  Reviewed importance of taking anticoagulant to prevent stroke with atrial fibrillation  [place]  calendar  Self-Care Activities:  Takes Heart Failure Medications as prescribed Weighs daily and record (notifying MD of 3 lb weight gain over night or 5 lb in a week) Verbalizes understanding of and follows CHF Action Plan Adheres to low sodium diet  Patient Goals:  - Take Heart Failure Medications as prescribed - Weigh daily and record (notify MD with 3 lb weight gain over night or 5 lb in a week) - Follow CHF Action Plan - Adhere to low sodium diet - develop a rescue plan - eat more whole grains, fruits and vegetables, lean meats and healthy fats - follow rescue plan if symptoms flare-up - know when to call the doctor - track symptoms and what helps feel better or worse - dress right for the weather, hot or cold - make an activity or exercise plan - pace activity allowing for rest - track symptoms during activity in diary - warm up and cool down for 10 minutes before and after exercise - keep legs up while sitting - track weight in diary - use salt in moderation - watch for swelling in feet, ankles and legs every day - check blood pressure weekly - choose a place to take my blood pressure (home, clinic or office, retail store) - write blood pressure results in a log or diary Follow Up Plan: Telephone follow up appointment with care management team member scheduled for: 12/07/20 at 9 AM The patient has been provided with contact  information for the care management team and has been advised to call with any health related questions or concerns.      Care Plan : Diabetes Type 2 (Adult)  Updates made by Dimitri Ped, RN since 10/24/2020 12:00 AM     Problem: Need for long term self management of Type 2 Diabetes   Priority: Medium     Long-Range Goal: Effective long term self management of Type 2 Diabetes   Start Date: 10/24/2020  Expected End Date: 03/11/2021  This Visit's Progress: On track  Priority: Medium  Note:   Objective:  Lab Results  Component Value Date   HGBA1C 7.0 06/29/2020   Lab Results  Component Value Date   CREATININE 0.73 04/10/2020   CREATININE 0.73 09/17/2019   CREATININE 0.69 07/21/2019   No results found for: EGFR Current Barriers:  Knowledge Deficits related to basic Diabetes pathophysiology and self care/management Knowledge Deficits related to medications used for management of  diabetes Unable to independently self manage Type 2 diabetes States she checks her CBG about every other day.  States her morning readings are higher and range 130-141 and if she checks later in the day it is in the 90's.  Denies any low readings.  States she tries to eat healthy and mostly cooks at home.  States she likes to walk on the treadmill but has not been able to exercise due to her hernia and she is recovering from her hernia repair. Case Manager Clinical Goal(s):  patient will demonstrate improved adherence to prescribed treatment plan for diabetes self care/management as evidenced by: daily monitoring and recording of CBG  adherence to ADA/ carb modified diet adherence to prescribed medication regimen contacting provider for new or worsened symptoms or questions Interventions:  Collaboration with [name], Michelle Halsted, MD regarding development and update of comprehensive plan of care as evidenced by provider attestation and co-signature Inter-disciplinary care team collaboration (see  longitudinal plan of care) Provided education to patient about basic DM disease process Reviewed medications with patient and discussed importance of medication adherence Discussed plans with patient for ongoing care management follow up and provided patient with direct contact information for care management team Provided patient with written educational materials related to hypo and hyperglycemia and importance of correct treatment Reviewed scheduled/upcoming provider appointments including: no upcoming primary care Advised patient, providing education and rationale, to check cbg daily and record, calling provider for findings outside established parameters.   Review of patient status, including review of consultants reports, relevant laboratory and other test results, and medications completed. Reviewed importance of annual eye exams and to call to schedule her annual exam Self-Care Activities - Self administers oral medications as prescribed Self administers injectable DM medication (Trulicity) as prescribed Attends all scheduled provider appointments Checks blood sugars as prescribed and utilize hyper and hypoglycemia protocol as needed Adheres to prescribed ADA/carb modified Patient Goals: - check blood sugar at prescribed times - check blood sugar before and after exercise - check blood sugar if I feel it is too high or too low - enter blood sugar readings and medication or insulin into daily log - take the blood sugar log to all doctor visits - change to whole grain breads, cereal, pasta - drink 6 to 8 glasses of water each day - fill half of plate with vegetables - limit fast food meals to no more than 1 per week - manage portion size - read food labels for fat, fiber, carbohydrates and portion size - switch to low-fat or skim milk - switch to sugar-free drinks - schedule appointment with eye doctor - check feet daily for cuts, sores or redness - keep feet up while sitting -  wash and dry feet carefully every day - wear comfortable, cotton socks - wear comfortable, well-fitting shoes Follow Up Plan: Telephone follow up appointment with care management team member scheduled for: 12/07/20 at 9 AM The patient has been provided with contact information for the care management team and has been advised to call with any health related questions or concerns.       Plan:Telephone follow up appointment with care management team member scheduled for:  12/07/20 and The patient has been provided with contact information for the care management team and has been advised to call with any health related questions or concerns.  Peter Garter RN, Jackquline Denmark, CDE Care Management Coordinator [facility] [phone], Mobile [phone]

## 2020-11-01 ENCOUNTER — Other Ambulatory Visit: Payer: Self-pay

## 2020-11-01 ENCOUNTER — Emergency Department (HOSPITAL_COMMUNITY)
Admission: EM | Admit: 2020-11-01 | Discharge: 2020-11-01 | Disposition: A | Discharge status: Home / Self Care | Payer: Medicare HMO | Attending: Emergency Medicine | Admitting: Emergency Medicine

## 2020-11-01 ENCOUNTER — Emergency Department (HOSPITAL_COMMUNITY): Payer: Medicare HMO

## 2020-11-01 DIAGNOSIS — Z87891 Personal history of nicotine dependence: Secondary | ICD-10-CM | POA: Insufficient documentation

## 2020-11-01 DIAGNOSIS — R0789 Other chest pain: Secondary | ICD-10-CM | POA: Diagnosis not present

## 2020-11-01 DIAGNOSIS — Z7984 Long term (current) use of oral hypoglycemic drugs: Secondary | ICD-10-CM | POA: Diagnosis not present

## 2020-11-01 DIAGNOSIS — Z20822 Contact with and (suspected) exposure to covid-19: Secondary | ICD-10-CM | POA: Insufficient documentation

## 2020-11-01 DIAGNOSIS — R079 Chest pain, unspecified: Secondary | ICD-10-CM | POA: Diagnosis not present

## 2020-11-01 DIAGNOSIS — Z95810 Presence of automatic (implantable) cardiac defibrillator: Secondary | ICD-10-CM | POA: Insufficient documentation

## 2020-11-01 DIAGNOSIS — I5043 Acute on chronic combined systolic (congestive) and diastolic (congestive) heart failure: Secondary | ICD-10-CM | POA: Insufficient documentation

## 2020-11-01 DIAGNOSIS — M79602 Pain in left arm: Secondary | ICD-10-CM | POA: Diagnosis not present

## 2020-11-01 DIAGNOSIS — Z955 Presence of coronary angioplasty implant and graft: Secondary | ICD-10-CM | POA: Diagnosis not present

## 2020-11-01 DIAGNOSIS — R0602 Shortness of breath: Secondary | ICD-10-CM | POA: Diagnosis not present

## 2020-11-01 DIAGNOSIS — I110 Hypertensive heart disease with heart failure: Secondary | ICD-10-CM | POA: Insufficient documentation

## 2020-11-01 DIAGNOSIS — Z794 Long term (current) use of insulin: Secondary | ICD-10-CM | POA: Diagnosis not present

## 2020-11-01 DIAGNOSIS — Z79899 Other long term (current) drug therapy: Secondary | ICD-10-CM | POA: Diagnosis not present

## 2020-11-01 DIAGNOSIS — E1159 Type 2 diabetes mellitus with other circulatory complications: Secondary | ICD-10-CM | POA: Diagnosis not present

## 2020-11-01 DIAGNOSIS — Z7901 Long term (current) use of anticoagulants: Secondary | ICD-10-CM | POA: Diagnosis not present

## 2020-11-01 DIAGNOSIS — I2510 Atherosclerotic heart disease of native coronary artery without angina pectoris: Secondary | ICD-10-CM | POA: Diagnosis not present

## 2020-11-01 DIAGNOSIS — Z853 Personal history of malignant neoplasm of breast: Secondary | ICD-10-CM | POA: Insufficient documentation

## 2020-11-01 DIAGNOSIS — K219 Gastro-esophageal reflux disease without esophagitis: Secondary | ICD-10-CM | POA: Insufficient documentation

## 2020-11-01 LAB — TROPONIN I (HIGH SENSITIVITY)
Troponin I (High Sensitivity): 7 ng/L (ref <18)
Troponin I (High Sensitivity): 8 ng/L (ref <18)

## 2020-11-01 LAB — CBC WITH DIFFERENTIAL/PLATELET
Abs Immature Granulocytes: 0.02 10*3/uL (ref 0.00–0.07)
Basophils Absolute: 0 10*3/uL (ref 0.0–0.1)
Basophils Relative: 1 %
Eosinophils Absolute: 0.4 10*3/uL (ref 0.0–0.5)
Eosinophils Relative: 6 %
HCT: 39.1 % (ref 36.0–46.0)
Hemoglobin: 13 g/dL (ref 12.0–15.0)
Immature Granulocytes: 0 %
Lymphocytes Relative: 30 %
Lymphs Abs: 2 10*3/uL (ref 0.7–4.0)
MCH: 31.1 pg (ref 26.0–34.0)
MCHC: 33.2 g/dL (ref 30.0–36.0)
MCV: 93.5 fL (ref 80.0–100.0)
Monocytes Absolute: 0.4 10*3/uL (ref 0.1–1.0)
Monocytes Relative: 7 %
Neutro Abs: 3.7 10*3/uL (ref 1.7–7.7)
Neutrophils Relative %: 56 %
Platelets: 217 10*3/uL (ref 150–400)
RBC: 4.18 MIL/uL (ref 3.87–5.11)
RDW: 12.3 % (ref 11.5–15.5)
WBC: 6.6 10*3/uL (ref 4.0–10.5)
nRBC: 0 % (ref 0.0–0.2)

## 2020-11-01 LAB — COMPREHENSIVE METABOLIC PANEL
ALT: 15 U/L (ref 0–44)
AST: 16 U/L (ref 15–41)
Albumin: 3.8 g/dL (ref 3.5–5.0)
Alkaline Phosphatase: 43 U/L (ref 38–126)
Anion gap: 9 (ref 5–15)
BUN: 11 mg/dL (ref 8–23)
CO2: 26 mmol/L (ref 22–32)
Calcium: 9.3 mg/dL (ref 8.9–10.3)
Chloride: 102 mmol/L (ref 98–111)
Creatinine, Ser: 0.62 mg/dL (ref 0.44–1.00)
GFR, Estimated: >60 mL/min (ref >60)
Glucose, Bld: 101 mg/dL — ABNORMAL HIGH (ref 70–99)
Potassium: 4.3 mmol/L (ref 3.5–5.1)
Sodium: 137 mmol/L (ref 135–145)
Total Bilirubin: 0.5 mg/dL (ref 0.3–1.2)
Total Protein: 6.9 g/dL (ref 6.5–8.1)

## 2020-11-01 LAB — LIPASE, BLOOD: Lipase: 55 U/L — ABNORMAL HIGH (ref 11–51)

## 2020-11-01 LAB — BRAIN NATRIURETIC PEPTIDE: B Natriuretic Peptide: 94.3 pg/mL (ref 0.0–100.0)

## 2020-11-01 LAB — RESP PANEL BY RT-PCR (FLU A&B, COVID) ARPGX2
Influenza A by PCR: NEGATIVE
Influenza B by PCR: NEGATIVE
SARS Coronavirus 2 by RT PCR: NEGATIVE

## 2020-11-01 LAB — PROTIME-INR
INR: 2.5 — ABNORMAL HIGH (ref 0.8–1.2)
Prothrombin Time: 27 seconds — ABNORMAL HIGH (ref 11.4–15.2)

## 2020-11-01 MED ORDER — PANTOPRAZOLE SODIUM 40 MG PO TBEC: 40.0000 mg | DELAYED_RELEASE_TABLET | Freq: Every day | ORAL | 1 refills | Status: DC

## 2020-11-01 NOTE — ED Notes (Signed)
Pt verbalizes understanding of discharge instructions. Opportunity for questions and answers were provided. Pt discharged from the ED.   ?

## 2020-11-01 NOTE — ED Triage Notes (Signed)
Pt c/o chest pressure, tingling ands numbness in her left arm, and shortness of breath.

## 2020-11-01 NOTE — ED Provider Notes (Signed)
[name] EMERGENCY DEPARTMENT Provider Note   CSN: [id number] Arrival date & time: 11/01/20  0105     History Chief Complaint  Patient presents with   Chest Pain    [name] is a 70 y.o. female.  HPI She presents for evaluation of chest and left arm pain with a sensation of palpitations which started about 11 PM last night.  These discomforts have spontaneously resolved, about an hour ago.  She has noticed a sensation of heartburn, felt as indigestion in the center of her chest, for the last week and a half.  She had abdominal wall surgery for ventral hernia, several weeks ago.  She reports wound is healing well and she is eating and stooling appropriately.  She denies fever, chills, cough, shortness of breath, weakness or dizziness.  There are no other known active modifying factors.    Past Medical History:  Diagnosis Date   Acute on chronic combined systolic and diastolic CHF, NYHA class 3 ([place]) 03/19/2013   AICD (automatic cardioverter/defibrillator) present 01/2013   Biventricular cardiac pacemaker in situ    Allergic rhinitis    Anogenital warts 01/22/2011   Anxiety state, unspecified 05/13/2013   BREAST CANCER, HX OF 09/19/2009   Qualifier: Diagnosis of  By: [name], [name]    CAD (coronary artery disease)    a. 2004: s/p MI in [place]. No PCI->Medical RX;  b. 07/2012 Cath: LM 30-40, LAD 70p, 70/42m D1 80-90p, OM1 small 90p, OM2 large 80-90p, 515m70-80d, RCA 20-30 diff, EF 40%, glob HK.s/p CABG   Cancer of left breast ([facility] [facility]2002   Patient reports left breast cancer diagnosis in 2002 treated with bilateral mastectomy positive lymph nodes with left axillary dissection followed by chemotherapy of unknown type   Cataract    Chronic combined systolic and diastolic CHF, NYHA class 2 (HCBranson10/31/2014   Diabetes mellitus type II    Exertional dyspnea 08/05/2014   Exertional shortness of breath    Generalized osteoarthrosis, involving multiple sites  08/25/2015   Hyperlipidemia    Hypertension    Incisional hernia, without obstruction or gangrene 05/04/2015   Ischemic cardiomyopathy    a. 07/2012 Echo: EF 35%, Sev inferoseptal HK, mildly dil LA, Peak PASP 5936m.   LBBB (left bundle branch block)    a. intermittent - present during rapid afib 07/2012.   MYOCARDIAL INFARCTION, HX OF 09/19/2009   Qualifier: Diagnosis of  By: TodSherren Mocha, JefJory Ee Neuromuscular disorder ([facility]  Patient reports chronic numbness in the right foot related to previous surgery on the right leg and "nerve damage"   PAF (paroxysmal atrial fibrillation) ([facility]CC[place]  a. 07/2012: Amio and xarelto initiated.   Right carotid bruit 08/11/2012   SBO (small bowel obstruction) (HCC)    SOB (shortness of breath) 02/26/2018   Type 2 diabetes mellitus with circulatory disorder, without long-term current use of insulin (HCCSaegertown/25/2017   Vitamin D deficiency 02/22/2016    Patient Active Problem List   Diagnosis Date Noted   Pacemaker battery depletion 04/17/2020   SOB (shortness of breath) 02/26/2018   Vitamin D deficiency 02/22/2016   Long term current use of anticoagulant 11/10/2015   Type 2 diabetes mellitus with circulatory disorder, without long-term current use of insulin (HCCBryan7/25/2017   Chronic pain disorder 10/03/2015   Generalized osteoarthrosis, involving multiple sites 08/25/2015   Other fatigue 08/11/2015   Back pain, chronic 08/11/2015   Incisional hernia 05/25/2015   Incisional  hernia, without obstruction or gangrene 05/04/2015   Exertional dyspnea 08/05/2014   Routine general medical examination at a health care facility 04/14/2014   Anxiety state, unspecified 05/13/2013   Acute on chronic combined systolic and diastolic CHF, NYHA class 3 ([place]) 03/19/2013   High anion gap metabolic acidosis 123456   Biventricular cardiac pacemaker - [employer], Nov 2014 03/04/2013   Chronic combined systolic and diastolic CHF, NYHA class 2 ([place]) 01/08/2013   S/P CABG x  4: 09/28/12 (LIMA-LAD, SVG-OM1-OM2, SVG-Intermediate) 09/29/2012   Right carotid bruit 08/11/2012   PAF (paroxysmal atrial fibrillation) ([place]) 07/21/2012   Ischemic cardiomyopathy    CAD (coronary artery disease)    Anogenital warts 01/22/2011   Hyperlipidemia 09/19/2009   Essential hypertension 09/19/2009   MYOCARDIAL INFARCTION, HX OF 09/19/2009   Allergic rhinitis 09/19/2009   BREAST CANCER, HX OF 09/19/2009    Past Surgical History:  Procedure Laterality Date   ABDOMINAL HYSTERECTOMY  2000   APPENDECTOMY  1974   BI-VENTRICULAR PACEMAKER INSERTION N/A 01/25/2013   Procedure: BI-VENTRICULAR PACEMAKER INSERTION (CRT-P);  Surgeon: Evans Lance, MD;  Location[facility] LAB;  Service: Cardiovascular;  Laterality: N/A;   BREAST BIOPSY Left 2002   CARDIAC CATHETERIZATION     CHOLECYSTECTOMY OPEN  1974   CORONARY ARTERY BYPASS GRAFT N/A 09/28/2012   Procedure: CORONARY ARTERY BYPASS GRAFTING (CABG);  Surgeon: [name], MD;  Location: [facility];  Service: Open Heart Surgery;  Laterality: N/A;  CABG x four, using left internal mammary artery and left leg greater saphenous vein harvested endoscopically   DILATION AND CURETTAGE OF UTERUS  1983   EPICARDIAL PACING LEAD PLACEMENT N/A 09/28/2012   Procedure: EPICARDIAL PACING LEAD PLACEMENT;  Surgeon: [name], MD;  Location: [facility];  Service: Thoracic;  Laterality: N/A;  LV LEAD PLACEMENT   HERNIA REPAIR     INCISIONAL HERNIA REPAIR N/A 05/25/2015   Procedure: LAPAROSCOPIC INCISIONAL HERNIA WITH MESH ;  Surgeon: [name], MD;  Location: [facility];  Service: General;  Laterality: N/A;   INCONTINENCE SURGERY  2000   INSERTION OF MESH N/A 05/25/2015   Procedure: INSERTION OF MESH;  Surgeon: [name], MD;  Location: [facility];  Service: General;  Laterality: N/A;   INTRAOPERATIVE TRANSESOPHAGEAL ECHOCARDIOGRAM N/A 09/28/2012   Procedure: INTRAOPERATIVE TRANSESOPHAGEAL ECHOCARDIOGRAM;  Surgeon: [name], MD;  Location: [facility];  Service: Open Heart Surgery;  Laterality: N/A;   LAPAROSCOPIC INCISIONAL / UMBILICAL / VENTRAL HERNIA REPAIR  05/25/2015   IHR   LEFT HEART CATHETERIZATION WITH CORONARY ANGIOGRAM N/A 07/20/2012   Procedure: LEFT HEART CATHETERIZATION WITH CORONARY ANGIOGRAM;  Surgeon: [name], MD;  Location[facility] C[facility] LAB;  Service: Cardiovascular;  Laterality: N/A;   MASTECTOMY Right 2002   MASTECTOMY MODIFIED RADICAL W/ AXILLARY LYMPH NODES W/ OR W/O PECTORALIS MINOR Left 2002   MAZE N/A 09/28/2012   Procedure: MAZE;  Surgeon: [name], MD;  Location: [facility];  Service: Open Heart Surgery;  Laterality: N/A;   ORIF WRIST FRACTURE Right 11/08/2017   Procedure: OPEN REDUCTION INTERNAL FIXATION (ORIF) WRIST FRACTURE;  Surgeon: [name], MD;  Location: [facility];  Service: Orthopedics;  Laterality: Right;  90 mins   PPM GENERATOR CHANGEOUT N/A 04/17/2020   Procedure: PPM GENERATOR CHANGEOUT;  Surgeon: [name], MD;  Location: [facility] CV LAB;  Service: Cardiovascular;  Laterality: N/A;   TENDON REPAIR Right 2001 X 3-4   torn ligaments and tendons in ankle up to knee from work  related accident   [place]     OB History   No obstetric history on file.     Family History  Problem Relation Age of Onset   Kidney failure Mother    Heart disease Mother        Died mid 0000000 complications of diabetes   Diabetes Mother    Colon cancer Sister    Arthritis Other    Diabetes Other    Hyperlipidemia Other    Ovarian cancer Other    Liver cancer Brother    Stomach cancer Neg Hx    Rectal cancer Neg Hx    Esophageal cancer Neg Hx    Pancreatic cancer Neg Hx     Social History   Tobacco Use   Smoking status: Former    Packs/day: 1.00    Years: 10.00    Pack years: 10.00    Types: Cigarettes    Quit date: 03/11/2012    Years since quitting: 8.6   Smokeless tobacco: Never  Vaping Use   Vaping Use: Never used  Substance Use Topics   Alcohol use: No   Drug use: No     Home Medications Prior to Admission medications   Medication Sig Start Date End Date Taking? Authorizing Provider  losartan (COZAAR) 25 MG tablet Take by mouth. 04/21/20  Yes [provider]  metFORMIN (GLUCOPHAGE) 1000 MG tablet Take by mouth. 12/28/19  Yes [provider]  pantoprazole (PROTONIX) 40 MG tablet Take 1 tablet (40 mg total) by mouth daily. 11/01/20  Yes Daleen Bo, MD  APPLE CIDER VINEGAR PO Take 5,220 mg by mouth in the morning, at noon, and at bedtime. 1740 mg each    [provider]  Blood Glucose Monitoring Suppl (TRUE METRIX AIR GLUCOSE METER) DEVI Check blood sugar 2 times a day 08/10/19   Philemon Kingdom, MD  carvedilol (COREG) 12.5 MG tablet TAKE 1 TABLET (12.5 MG) BY MOUTH 2 (TWO) TIMES DAILY WITH A MEAL. 06/28/20   Croitoru, Mihai, MD  Dulaglutide (TRULICITY) 1.5 0000000 SOPN INJECT 1.'5MG'$  (1 PEN) SUBCUTANEOUSLY EVERY WEEK Patient taking differently: Inject 1.5 mg into the skin every Sunday. 12/28/19   Isaac Bliss, Rayford Halsted, MD  empagliflozin (JARDIANCE) 25 MG TABS tablet Take 1 tablet (25 mg total) by mouth daily before breakfast. 12/28/19   Isaac Bliss, Rayford Halsted, MD  furosemide (LASIX) 20 MG tablet TAKE 1 TABLET EVERY DAY Patient taking differently: Take 20 mg by mouth daily. 02/07/20   Croitoru, Mihai, MD  glipiZIDE (GLUCOTROL) 5 MG tablet TAKE ONE TABLET BY MOUTH TWICE A DAY BEFORE A MEAL Patient taking differently: Take 5 mg by mouth 2 (two) times daily before a meal. 12/28/19   Isaac Bliss, Rayford Halsted, MD  glucose blood (TRUE METRIX BLOOD GLUCOSE TEST) test strip Use to check blood sugar 2 times a day. 08/10/19   Philemon Kingdom, MD  isosorbide mononitrate (IMDUR) 60 MG 24 hr tablet TAKE 1 TABLET EVERY DAY 10/11/20   Croitoru, Mihai, MD  losartan (COZAAR) 25 MG tablet TAKE 1 TABLET TWICE DAILY Patient taking differently: Take 25 mg by mouth 2 (two) times daily. 02/07/20   Croitoru, Mihai, MD  metFORMIN (GLUCOPHAGE) 1000  MG tablet TAKE ONE TABLET BY MOUTH TWICE A DAY WITH FOOD Patient taking differently: Take 1,000 mg by mouth 2 (two) times daily with a meal. 12/28/19   Isaac Bliss, Rayford Halsted, MD  nitroGLYCERIN (NITROSTAT) 0.4 MG SL tablet Place 1 tablet (0.4 mg total) under  the tongue every 5 (five) minutes as needed for chest pain. 04/19/19   Croitoru, Mihai, MD  oxyCODONE (OXY IR/ROXICODONE) 5 MG immediate release tablet Take by mouth. 09/30/20   [provider]  REPATHA SURECLICK [id number] MG/ML SOAJ INJECT 140 MG INTO THE SKIN EVERY 14 (FOURTEEN) DAYS. 04/12/20   Croitoru, Mihai, MD  TRUEplus Lancets 33G MISC Use to check blood sugar 2 times a day. 08/10/19   Philemon Kingdom, MD  XARELTO 20 MG TABS tablet TAKE 1 TABLET (20 MG TOTAL) BY MOUTH DAILY WITH SUPPER. 06/28/20   Croitoru, Dani Gobble, MD    Allergies    Statins  Review of Systems   Review of Systems  All other systems reviewed and are negative.  Physical Exam Updated Vital Signs BP 123/65   Pulse 78   Temp 98.2 F (36.8 C)   Resp 18   Ht '5\' 4"'$  (1.626 m)   Wt 65.8 kg   SpO2 96%   BMI 24.89 kg/m   Physical Exam Vitals and nursing note reviewed.  Constitutional:      General: She is not in acute distress.    Appearance: She is well-developed. She is not ill-appearing, toxic-appearing or diaphoretic.  HENT:     Head: Normocephalic and atraumatic.     Right Ear: External ear normal.     Left Ear: External ear normal.     Mouth/Throat:     Mouth: Mucous membranes are moist.     Pharynx: No oropharyngeal exudate.  Eyes:     Conjunctiva/sclera: Conjunctivae normal.     Pupils: Pupils are equal, round, and reactive to light.  Neck:     Trachea: Phonation normal.  Cardiovascular:     Rate and Rhythm: Normal rate and regular rhythm.     Heart sounds: Normal heart sounds.  Pulmonary:     Effort: Pulmonary effort is normal.     Breath sounds: Normal breath sounds.  Chest:     Chest wall: No tenderness.  Abdominal:     General: There  is no distension.     Palpations: Abdomen is soft.     Tenderness: There is no abdominal tenderness.     Comments: Healing transverse lower abdominal wound, no signs of infection or dehiscence.  Musculoskeletal:        General: Normal range of motion.     Cervical back: Normal range of motion and neck supple.  Skin:    General: Skin is warm and dry.  Neurological:     Mental Status: She is alert and oriented to person, place, and time.     Cranial Nerves: No cranial nerve deficit.     Sensory: No sensory deficit.     Motor: No abnormal muscle tone.     Coordination: Coordination normal.  Psychiatric:        Mood and Affect: Mood normal.        Behavior: Behavior normal.        Thought Content: Thought content normal.        Judgment: Judgment normal.    ED Results / Procedures / Treatments   Labs (all labs ordered are listed, but only abnormal results are displayed) Labs Reviewed  COMPREHENSIVE METABOLIC PANEL - Abnormal; Notable for the following components:      Result Value   Glucose, Bld 101 (*)    All other components within normal limits  LIPASE, BLOOD - Abnormal; Notable for the following components:   Lipase 55 (*)    All other  components within normal limits  PROTIME-INR - Abnormal; Notable for the following components:   Prothrombin Time 27.0 (*)    INR 2.5 (*)    All other components within normal limits  RESP PANEL BY RT-PCR (FLU A&B, COVID) ARPGX2  CBC WITH DIFFERENTIAL/PLATELET  BRAIN NATRIURETIC PEPTIDE  TROPONIN I (HIGH SENSITIVITY)  TROPONIN I (HIGH SENSITIVITY)    EKG EKG Interpretation  Date/Time:  Wednesday November 01 2020 01:47:16 EDT Ventricular Rate:  84 PR Interval:  150 QRS Duration: 144 QT Interval:  444 QTC Calculation: 524 R Axis:   139 Text Interpretation: Atrial-sensed ventricular-paced rhythm Abnormal ECG since last tracing no significant change Confirmed by Daleen Bo [phone]) on 11/01/2020 8:55:04 AM    Radiology DG Chest 2  View  Result Date: 11/01/2020 CLINICAL DATA:  Chest pain and shortness of breath. EXAM: CHEST - 2 VIEW COMPARISON:  Chest radiograph dated 09/17/2019. FINDINGS: No focal consolidation, pleural effusion or pneumothorax. The cardiac silhouette is within limits. Median sternotomy wires. Left pectoral pacemaker device. No acute osseous pathology. IMPRESSION: No active cardiopulmonary disease. Electronically Signed   By: Anner Crete M.D.   On: 11/01/2020 02:49    Procedures Procedures   Medications Ordered in ED Medications - No data to display  ED Course  I have reviewed the triage vital signs and the nursing notes.  Pertinent labs & imaging results that were available during my care of the patient were reviewed by me and considered in my medical decision making (see chart for details).    MDM Rules/Calculators/A&P                            Patient Vitals for the past 24 hrs:  BP Temp Pulse Resp SpO2 Height Weight  11/01/20 0845 123/65 -- 78 18 96 % -- --  11/01/20 0830 137/68 -- 82 (!) 22 98 % -- --  11/01/20 0802 131/75 -- 83 17 100 % -- --  11/01/20 0630 113/73 -- 83 20 99 % -- --  11/01/20 0413 126/75 -- 79 18 100 % -- --  11/01/20 0151 (!) 161/79 98.2 F (36.8 C) 88 17 97 % -- --  11/01/20 0148 -- -- -- -- -- '5\' 4"'$  (1.626 m) 65.8 kg  11/01/20 0108 (!) 158/84 97.8 F (36.6 C) 87 18 99 % -- --    9:08 AM Reevaluation with update and discussion. After initial assessment and treatment, an updated evaluation reveals no change in clinical status, she is comfortable, findings discussed and questions answered. Daleen Bo   Medical Decision Making:  This patient is presenting for evaluation of chest discomfort, which does require a range of treatment options, and is a complaint that involves a high risk of morbidity and mortality. The differential diagnoses include ACS, pneumonia, heart failure, GERD. I decided to review old records, and in summary elderly female presenting  with sudden onset of symptoms at 45 PM last night which spontaneously resolved.  She has history of coronary artery bypass, cardiac pacemaker, heart failure, diabetes, hypertension and atrial fibrillation.  I did not require additional historical information from anyone.  Clinical Laboratory Tests Ordered, included CBC, Metabolic panel, and troponin, lipase, PT/INR viral panel . Review indicates normal except lipase minimally elevated, INR high. Radiologic Tests Ordered, included chest x-ray.  I independently Visualized: Radiograph images, which show no infiltrate or edema  Cardiac Monitor Tracing which shows paced rhythm   Critical Interventions-clinical evaluation, laboratory testing, radiography, EKG and  cardiac monitor, observation and reassessment  After These Interventions, the Patient was reevaluated and was found stable for discharge.  Patient has signs symptoms of GERD, with reassuring cardiac markers and EKG.  No syncope, or shortness of breath.  She is stable for discharge and outpatient management.  Will start Protonix, supplement with antacid, and refer to PCP.  CRITICAL CARE-no Performed by: Daleen Bo  Nursing Notes Reviewed/ Care Coordinated Applicable Imaging Reviewed Interpretation of Laboratory Data incorporated into ED treatment  The patient appears reasonably screened and/or stabilized for discharge and I doubt any other medical condition or other [facility] requiring further screening, evaluation, or treatment in the ED at this time prior to discharge.  Plan: Home Medications-antacids before meals and at bedtime, if needed, continue usual; Home Treatments-rest, fluids; return here if the recommended treatment, does not improve the symptoms; Recommended follow up-PCP follow-up 2 to 3 weeks and as needed     Final Clinical Impression(s) / ED Diagnoses Final diagnoses:  Gastroesophageal reflux disease, unspecified whether esophagitis present    Rx / DC Orders ED  Discharge Orders          Ordered    pantoprazole (PROTONIX) 40 MG tablet  Daily        11/01/20 0914             Daleen Bo, MD 11/01/20 [phone]

## 2020-11-01 NOTE — Discharge Instructions (Addendum)
The testing for heart problems today was normal.  You likely have heartburn/GERD, causing your symptoms.  We are starting Protonix, to help that.  You may need to supplement with an antacid like Maalox or Mylanta, before meals and at bedtime for the first few days until the Protonix starts helping.  Return here if needed.  Follow-up with your doctor for further care and management.

## 2020-11-01 NOTE — ED Provider Notes (Signed)
Emergency Medicine Provider Triage Evaluation Note  [name] , a 70 y.o. female  was evaluated in triage.  Pt complains of chest pain.  Patient reports central chest pain and pressure, numbness and paresthesias in her left arm, and shortness of breath that began suddenly at 23:30.  She has a history of CAD and reports that her symptoms feel similar, but milder, to previous MIs.  Patient did also have a ventral hernia repair on July 19 and reports that her recovery has been benign.  She does report that she developed worsening fatigue and GERD (of which she does not have a history) and intermittent episodes of chest pain over the last 2 weeks, but she suspected that these were due to her recent procedure.  No fever, chills, palpitation, vomiting, diarrhea, diaphoresis, syncope, visual changes.   Review of Systems  Positive: Chest pain, paresthesias, numbness, shortness of breath, fatigue, epigastric pain, belching Negative: Cough, fever, chills, vomiting, diaphoresis, syncope, visual changes, palpitations,   Physical Exam  BP (!) 161/79 (BP Location: Right Arm)   Pulse 88   Temp 98.2 F (36.8 C)   Resp 17   Ht '5\' 4"'$  (1.626 m)   Wt 65.8 kg   SpO2 97%   BMI 24.89 kg/m  Gen:   Awake, no distress   Resp:  Normal effort  MSK:   Moves extremities without difficulty  Other:  Pacemaker scar noted over the left chest wall  Medical Decision Making  Medically screening exam initiated at 1:54 AM.  Appropriate orders placed.  [name] was informed that the remainder of the evaluation will be completed by another provider, this initial triage assessment does not replace that evaluation, and the importance of remaining in the ED until their evaluation is complete.  Labs imaging have been ordered.  She will require further work-up and evaluation in the emergency department.   [name], PA-C 11/01/20 0154    [name], MD 11/01/20 [phone]

## 2020-11-03 ENCOUNTER — Encounter: Payer: Medicare HMO | Admitting: Student

## 2020-11-06 ENCOUNTER — Encounter: Payer: Self-pay | Admitting: Internal Medicine

## 2020-11-06 DIAGNOSIS — Z0000 Encounter for general adult medical examination without abnormal findings: Secondary | ICD-10-CM | POA: Diagnosis not present

## 2020-11-06 DIAGNOSIS — R197 Diarrhea, unspecified: Secondary | ICD-10-CM | POA: Diagnosis not present

## 2020-11-08 ENCOUNTER — Other Ambulatory Visit: Payer: Self-pay

## 2020-11-08 NOTE — Progress Notes (Signed)
Remote pacemaker transmission.   

## 2020-11-09 ENCOUNTER — Other Ambulatory Visit: Payer: Self-pay

## 2020-11-09 ENCOUNTER — Ambulatory Visit (INDEPENDENT_AMBULATORY_CARE_PROVIDER_SITE_OTHER): Payer: Medicare HMO | Admitting: Internal Medicine

## 2020-11-09 ENCOUNTER — Encounter: Payer: Self-pay | Admitting: Internal Medicine

## 2020-11-09 VITALS — BP 130/70 | HR 91 | Temp 98.7°F | Ht 64.0 in | Wt 151.4 lb

## 2020-11-09 DIAGNOSIS — E1159 Type 2 diabetes mellitus with other circulatory complications: Secondary | ICD-10-CM | POA: Diagnosis not present

## 2020-11-09 DIAGNOSIS — I10 Essential (primary) hypertension: Secondary | ICD-10-CM | POA: Diagnosis not present

## 2020-11-09 DIAGNOSIS — E782 Mixed hyperlipidemia: Secondary | ICD-10-CM | POA: Diagnosis not present

## 2020-11-09 DIAGNOSIS — Z09 Encounter for follow-up examination after completed treatment for conditions other than malignant neoplasm: Secondary | ICD-10-CM

## 2020-11-09 DIAGNOSIS — R079 Chest pain, unspecified: Secondary | ICD-10-CM

## 2020-11-09 NOTE — Progress Notes (Signed)
Established Patient Office Visit     This visit occurred during the SARS-CoV-2 public health emergency.  Safety protocols were in place, including screening questions prior to the visit, additional usage of staff PPE, and extensive cleaning of exam room while observing appropriate contact time as indicated for disinfecting solutions.    CC/Reason for Visit: ED follow-up  HPI: [name] is a 70 y.o. female who is coming in today for the above mentioned reasons. Past Medical History is significant for: Coronary artery disease status post CABG with a biventricular pacer with recovered EF, atrial fibrillation anticoagulated on Xarelto, diabetes, hyperlipidemia.  I last saw her in June for preoperative clearance for an umbilical hernia repair.  She states that her surgery went quite well.  However on the late night of August 24 she developed chest pain and jaw pain.  Given her heart history she decided to proceed to the emergency department for evaluation.  Hospital charts have been reviewed in great detail.  All work-up was negative including EKG, troponin, lab work.  It was thought that she could possibly have GERD and was discharged on Protonix.  She has been taking it but wonders if she can stop taking it as she does not believe this pain was related to indigestion and she would like to avoid an additional medication if possible.  She has had no further recurrence of chest pain.  Of note she had a recent stress test that was normal and her EF had recovered per her most recent echocardiogram after pacemaker implantation.  She is feeling back to her baseline.   Past Medical/Surgical History: Past Medical History:  Diagnosis Date   Acute on chronic combined systolic and diastolic CHF, NYHA class 3 ([place]) 03/19/2013   AICD (automatic cardioverter/defibrillator) present 01/2013   Biventricular cardiac pacemaker in situ    Allergic rhinitis    Anogenital warts 01/22/2011   Anxiety state,  unspecified 05/13/2013   BREAST CANCER, HX OF 09/19/2009   Qualifier: Diagnosis of  By: [name], [name]    CAD (coronary artery disease)    a. 2004: s/p MI in [place]. No PCI->Medical RX;  b. 07/2012 Cath: LM 30-40, LAD 70p, 70/52m D1 80-90p, OM1 small 90p, OM2 large 80-90p, 550m70-80d, RCA 20-30 diff, EF 40%, glob HK.s/p CABG   Cancer of left breast ([place]2002   Patient reports left breast cancer diagnosis in 2002 treated with bilateral mastectomy positive lymph nodes with left axillary dissection followed by chemotherapy of unknown type   Cataract    Chronic combined systolic and diastolic CHF, NYHA class 2 (HCCh[place]10/31/2014   Diabetes mellitus type II    Exertional dyspnea 08/05/2014   Exertional shortness of breath    Generalized osteoarthrosis, involving multiple sites 08/25/2015   Hyperlipidemia    Hypertension    Incisional hernia, without obstruction or gangrene 05/04/2015   Ischemic cardiomyopathy    a. 07/2012 Echo: EF 35%, Sev inferoseptal HK, mildly dil LA, Peak PASP 5945m.   LBBB (left bundle branch block)    a. intermittent - present during rapid afib 07/2012.   MYOCARDIAL INFARCTION, HX OF 09/19/2009   Qualifier: Diagnosis of  By: TodSherren Mocha, JefJory Ee Neuromuscular disorder ([facility]  Patient reports chronic numbness in the right foot related to previous surgery on the right leg and "nerve damage"   PAF (paroxysmal atrial fibrillation) ([facility]  a. 07/2012: Amio and xarelto initiated.   Right carotid bruit 08/11/2012  SBO (small bowel obstruction) (HCC)    SOB (shortness of breath) 02/26/2018   Type 2 diabetes mellitus with circulatory disorder, without long-term current use of insulin ([place]) 10/03/2015   Vitamin D deficiency 02/22/2016    Past Surgical History:  Procedure Laterality Date   ABDOMINAL HYSTERECTOMY  2000   APPENDECTOMY  1974   BI-VENTRICULAR PACEMAKER INSERTION N/A 01/25/2013   Procedure: BI-VENTRICULAR PACEMAKER INSERTION (CRT-P);  Surgeon: Evans Lance,  MD;  Location[facility] CATH LAB;  Service: Cardiovascular;  Laterality: N/A;   BREAST BIOPSY Left 2002   CARDIAC CATHETERIZATION     CHOLECYSTECTOMY OPEN  1974   CORONARY ARTERY BYPASS GRAFT N/A 09/28/2012   Procedure: CORONARY ARTERY BYPASS GRAFTING (CABG);  Surgeon: [name], MD;  Location: [place];  Service: Open Heart Surgery;  Laterality: N/A;  CABG x four, using left internal mammary artery and left leg greater saphenous vein harvested endoscopically   DILATION AND CURETTAGE OF UTERUS  1983   EPICARDIAL PACING LEAD PLACEMENT N/A 09/28/2012   Procedure: EPICARDIAL PACING LEAD PLACEMENT;  Surgeon: [name], MD;  Location: [place];  Service: Thoracic;  Laterality: N/A;  LV LEAD PLACEMENT   HERNIA REPAIR     INCISIONAL HERNIA REPAIR N/A 05/25/2015   Procedure: LAPAROSCOPIC INCISIONAL HERNIA WITH MESH ;  Surgeon: [name], MD;  Location: [place];  Service: General;  Laterality: N/A;   INCONTINENCE SURGERY  2000   INSERTION OF MESH N/A 05/25/2015   Procedure: INSERTION OF MESH;  Surgeon: [name], MD;  Location: [place];  Service: General;  Laterality: N/A;   INTRAOPERATIVE TRANSESOPHAGEAL ECHOCARDIOGRAM N/A 09/28/2012   Procedure: INTRAOPERATIVE TRANSESOPHAGEAL ECHOCARDIOGRAM;  Surgeon: [name], MD;  Location: [place];  Service: Open Heart Surgery;  Laterality: N/A;   LAPAROSCOPIC INCISIONAL / UMBILICAL / VENTRAL HERNIA REPAIR  05/25/2015   IHR   LEFT HEART CATHETERIZATION WITH CORONARY ANGIOGRAM N/A 07/20/2012   Procedure: LEFT HEART CATHETERIZATION WITH CORONARY ANGIOGRAM;  Surgeon: [name], MD;  [facility] LAB;  Service: Cardiovascular;  Laterality: N/A;   MASTECTOMY Right 2002   MASTECTOMY MODIFIED RADICAL W/ AXILLARY LYMPH NODES W/ OR W/O PECTORALIS MINOR Left 2002   MAZE N/A 09/28/2012   Procedure: MAZE;  Surgeon: [name], MD;  Location: [place];  Service: Open Heart Surgery;  Laterality: N/A;   ORIF WRIST FRACTURE Right 11/08/2017    Procedure: OPEN REDUCTION INTERNAL FIXATION (ORIF) WRIST FRACTURE;  Surgeon: [name], MD;  Location: [place];  Service: Orthopedics;  Laterality: Right;  90 mins   PPM GENERATOR CHANGEOUT N/A 04/17/2020   Procedure: PPM GENERATOR CHANGEOUT;  Surgeon: [name], MD;  Location: [place] CV LAB;  Service: Cardiovascular;  Laterality: N/A;   TENDON REPAIR Right 2001 X 3-4   torn ligaments and tendons in ankle up to knee from work related accident   [place]    Social History:  reports that she quit smoking about 8 years ago. Her smoking use included cigarettes. She has a 10.00 pack-year smoking history. She has never used smokeless tobacco. She reports that she does not drink alcohol and does not use drugs.  Allergies: Allergies  Allergen Reactions   Statins Other (See Comments)    Myalgias with rosuvastatin, atorvastatin and simvastatin     Family History:  Family History  Problem Relation Age of Onset   Kidney failure Mother    Heart disease Mother        Died mid  0000000 complications of diabetes   Diabetes Mother    Colon cancer Sister    Arthritis Other    Diabetes Other    Hyperlipidemia Other    Ovarian cancer Other    Liver cancer Brother    Stomach cancer Neg Hx    Rectal cancer Neg Hx    Esophageal cancer Neg Hx    Pancreatic cancer Neg Hx      Current Outpatient Medications:    APPLE CIDER VINEGAR PO, Take 5,220 mg by mouth in the morning, at noon, and at bedtime. 1740 mg each, Disp: , Rfl:    Blood Glucose Monitoring Suppl (TRUE METRIX AIR GLUCOSE METER) DEVI, Check blood sugar 2 times a day, Disp: 1 each, Rfl: 0   carvedilol (COREG) 12.5 MG tablet, TAKE 1 TABLET (12.5 MG) BY MOUTH 2 (TWO) TIMES DAILY WITH A MEAL., Disp: 180 tablet, Rfl: 3   Dulaglutide (TRULICITY) 1.5 0000000 SOPN, INJECT 1.'5MG'$  (1 PEN) SUBCUTANEOUSLY EVERY WEEK (Patient taking differently: Inject 1.5 mg into the skin every Sunday.), Disp: 18 mL, Rfl: 1   empagliflozin  (JARDIANCE) 25 MG TABS tablet, Take 1 tablet (25 mg total) by mouth daily before breakfast., Disp: 90 tablet, Rfl: 3   furosemide (LASIX) 20 MG tablet, TAKE 1 TABLET EVERY DAY (Patient taking differently: Take 20 mg by mouth daily.), Disp: 90 tablet, Rfl: 3   glipiZIDE (GLUCOTROL) 5 MG tablet, TAKE ONE TABLET BY MOUTH TWICE A DAY BEFORE A MEAL (Patient taking differently: Take 5 mg by mouth 2 (two) times daily before a meal.), Disp: 200 tablet, Rfl: 4   glucose blood (TRUE METRIX BLOOD GLUCOSE TEST) test strip, Use to check blood sugar 2 times a day., Disp: 200 each, Rfl: 12   isosorbide mononitrate (IMDUR) 60 MG 24 hr tablet, TAKE 1 TABLET EVERY DAY, Disp: 90 tablet, Rfl: 3   losartan (COZAAR) 25 MG tablet, TAKE 1 TABLET TWICE DAILY (Patient taking differently: Take 25 mg by mouth 2 (two) times daily.), Disp: 180 tablet, Rfl: 3   losartan (COZAAR) 25 MG tablet, Take by mouth., Disp: , Rfl:    metFORMIN (GLUCOPHAGE) 1000 MG tablet, TAKE ONE TABLET BY MOUTH TWICE A DAY WITH FOOD (Patient taking differently: Take 1,000 mg by mouth 2 (two) times daily with a meal.), Disp: 200 tablet, Rfl: 4   metFORMIN (GLUCOPHAGE) 1000 MG tablet, Take by mouth., Disp: , Rfl:    nitroGLYCERIN (NITROSTAT) 0.4 MG SL tablet, Place 1 tablet (0.4 mg total) under the tongue every 5 (five) minutes as needed for chest pain., Disp: 25 tablet, Rfl: 3   pantoprazole (PROTONIX) 40 MG tablet, Take 1 tablet (40 mg total) by mouth daily., Disp: 30 tablet, Rfl: 1   REPATHA SURECLICK XX123456 MG/ML SOAJ, INJECT 140 MG INTO THE SKIN EVERY 14 (FOURTEEN) DAYS., Disp: 6 mL, Rfl: 3   TRUEplus Lancets 33G MISC, Use to check blood sugar 2 times a day., Disp: 200 each, Rfl: 12   XARELTO 20 MG TABS tablet, TAKE 1 TABLET (20 MG TOTAL) BY MOUTH DAILY WITH SUPPER., Disp: 90 tablet, Rfl: 3  Review of Systems:  Constitutional: Denies fever, chills, diaphoresis, appetite change and fatigue.  HEENT: Denies photophobia, eye pain, redness, hearing loss, ear  pain, congestion, sore throat, rhinorrhea, sneezing, mouth sores, trouble swallowing, neck pain, neck stiffness and tinnitus.   Respiratory: Denies SOB, DOE, cough, chest tightness,  and wheezing.   Cardiovascular: Denies chest pain, palpitations and leg swelling.  Gastrointestinal: Denies nausea, vomiting, abdominal pain, diarrhea,  constipation, blood in stool and abdominal distention.  Genitourinary: Denies dysuria, urgency, frequency, hematuria, flank pain and difficulty urinating.  Endocrine: Denies: hot or cold intolerance, sweats, changes in hair or nails, polyuria, polydipsia. Musculoskeletal: Denies myalgias, back pain, joint swelling, arthralgias and gait problem.  Skin: Denies pallor, rash and wound.  Neurological: Denies dizziness, seizures, syncope, weakness, light-headedness, numbness and headaches.  Hematological: Denies adenopathy. Easy bruising, personal or family bleeding history  Psychiatric/Behavioral: Denies suicidal ideation, mood changes, confusion, nervousness, sleep disturbance and agitation    Physical Exam: Vitals:   11/09/20 0700  BP: 130/70  Pulse: 91  Temp: 98.7 F (37.1 C)  TempSrc: Oral  SpO2: 97%  Weight: 151 lb 6.4 oz (68.7 kg)  Height: '5\' 4"'$  (1.626 m)    Body mass index is 25.99 kg/m.   Constitutional: NAD, calm, comfortable Eyes: PERRL, lids and conjunctivae normal ENMT: Mucous membranes are moist.  Respiratory: clear to auscultation bilaterally, no wheezing, no crackles. Normal respiratory effort. No accessory muscle use.  Cardiovascular: Regular rate and rhythm, no murmurs / rubs / gallops. No extremity edema.  Neurologic: Grossly intact and nonfocal Psychiatric: Normal judgment and insight. Alert and oriented x 3. Normal mood.    Impression and Plan:  Hospital discharge follow-up  Chest pain, unspecified type  Type 2 diabetes mellitus with other circulatory complication, without long-term current use of insulin [facility])  Essential  hypertension  Mixed hyperlipidemia  [facility] charts have been reviewed in detail.  Work-up in the ED was negative including EKG, troponins, imaging studies.  This is very reassuring especially with the addition of a recent normal echocardiogram and stress test. -Do not believe further cardiac work-up is necessary at this time. -I think it is okay for her to try and wean herself off of the PPI therapy.  She will start taking it every other day and then every third day until she is off. -She will schedule follow-up in 3 weeks as she is overdue for CPE with lab work.  Time spent: 32 minutes reviewing chart, interviewing and examining patient and formulating plan of care.    Lelon Frohlich, MD [facility]

## 2020-11-17 ENCOUNTER — Other Ambulatory Visit: Payer: Self-pay

## 2020-11-17 ENCOUNTER — Ambulatory Visit (INDEPENDENT_AMBULATORY_CARE_PROVIDER_SITE_OTHER): Payer: Medicare HMO | Admitting: Internal Medicine

## 2020-11-17 VITALS — BP 140/80 | HR 92 | Temp 98.2°F | Wt 151.4 lb

## 2020-11-17 DIAGNOSIS — L509 Urticaria, unspecified: Secondary | ICD-10-CM

## 2020-11-17 DIAGNOSIS — Z23 Encounter for immunization: Secondary | ICD-10-CM

## 2020-11-17 NOTE — Progress Notes (Signed)
Acute office Visit     This visit occurred during the SARS-CoV-2 public health emergency.  Safety protocols were in place, including screening questions prior to the visit, additional usage of staff PPE, and extensive cleaning of exam room while observing appropriate contact time as indicated for disinfecting solutions.    CC/Reason for Visit: Discuss rash  HPI: [name] is a 70 y.o. female who is coming in today for the above mentioned reasons.  She was just seen last week for her physical.  About 2 days ago she started noticing a very pruritic rash over her face and neck.  She has very large raised areas over her forehead, cheeks, neck.  No new soaps, lotions, facial cleansers, laundry detergents.  Past Medical/Surgical History: Past Medical History:  Diagnosis Date   Acute on chronic combined systolic and diastolic CHF, NYHA class 3 ([place]) 03/19/2013   AICD (automatic cardioverter/defibrillator) present 01/2013   Biventricular cardiac pacemaker in situ    Allergic rhinitis    Anogenital warts 01/22/2011   Anxiety state, unspecified 05/13/2013   BREAST CANCER, HX OF 09/19/2009   Qualifier: Diagnosis of  By: [name], [name]    CAD (coronary artery disease)    a. 2004: s/p MI in [place]. No PCI->Medical RX;  b. 07/2012 Cath: LM 30-40, LAD 70p, 70/78m D1 80-90p, OM1 small 90p, OM2 large 80-90p, 54m70-80d, RCA 20-30 diff, EF 40%, glob HK.s/p CABG   Can[place] of left breast ([place]   Patient reports left breast cancer diagnosis in 2002 treated with bilateral mastectomy positive lymph nodes with left axillary dissection followed by chemotherapy of unknown type   Cataract    Chronic combined systolic and diastolic CHF, NYHA class 2 (HCAv[place]10/31/2014   Diabetes mellitus type II    Exertional dyspnea 08/05/2014   Exertional shortness of breath    Generalized osteoarthrosis, involving multiple sites 08/25/2015   Hyperlipidemia    Hypertension    Incisional hernia, without  obstruction or gangrene 05/04/2015   Ischemic cardiomyopathy    a. 07/2012 Echo: EF 35%, Sev inferoseptal HK, mildly dil LA, Peak PASP 599m.   LBBB (left bundle branch block)    a. intermittent - present during rapid afib 07/2012.   MYOCARDIAL INFARCTION, HX OF 09/19/2009   Qualifier: Diagnosis of  By: TodSherren Mocha, JefJory Ee Neuromuscular disorder ([facility]  Patient reports chronic numbness in the right foot related to previous surgery on the right leg and "nerve damage"   PAF (paroxysmal atrial fibrillation) ([place]CC[place]  a. 07/2012: Amio and xarelto initiated.   Right carotid bruit 08/11/2012   SBO (small bowel obstruction) (HCC)    SOB (shortness of breath) 02/26/2018   Type 2 diabetes mellitus with circulatory disorder, without long-term current use of insulin (HCCCascade/25/2017   Vitamin D deficiency 02/22/2016    Past Surgical History:  Procedure Laterality Date   ABDOMINAL HYSTERECTOMY  2000   APPENDECTOMY  1974   BI-VENTRICULAR PACEMAKER INSERTION N/A 01/25/2013   Procedure: BI-VENTRICULAR PACEMAKER INSERTION (CRT-P);  Surgeon: GreEvans LanceD;  [facility];  Service: Cardiovascular;  Laterality: N/A;   BREAST BIOPSY Left 2002   CARDIAC CATHETERIZATION     CHOLECYSTECTOMY OPEN  1974   CORONARY ARTERY BYPASS GRAFT N/A 09/28/2012   Procedure: CORONARY ARTERY BYPASS GRAFTING (CABG);  Surgeon: EdwGrace IsaacD;  [facility] [place];  Laterality: N/A;  CABG x four, using left  internal mammary artery and left leg greater saphenous vein harvested endoscopically   DILATION AND CURETTAGE OF UTERUS  1983   EPICARDIAL PACING LEAD PLACEMENT N/A 09/28/2012   Procedure: EPICARDIAL PACING LEAD PLACEMENT;  Surgeon: [name], MD;  Location: [place];  Service: Thoracic;  Laterality: N/A;  LV LEAD PLACEMENT   HERNIA REPAIR     INCISIONAL HERNIA REPAIR N/A 05/25/2015   Procedure: LAPAROSCOPIC INCISIONAL HERNIA WITH MESH ;  Surgeon: [name], MD;   Location: [place];  Service: General;  Laterality: N/A;   INCONTINENCE SURGERY  2000   INSERTION OF MESH N/A 05/25/2015   Procedure: INSERTION OF MESH;  Surgeon: [name], MD;  Location: [place];  Service: General;  Laterality: N/A;   INTRAOPERATIVE TRANSESOPHAGEAL ECHOCARDIOGRAM N/A 09/28/2012   Procedure: INTRAOPERATIVE TRANSESOPHAGEAL ECHOCARDIOGRAM;  Surgeon: [name], MD;  Location: [place];  Service: Open Heart Surgery;  Laterality: N/A;   LAPAROSCOPIC INCISIONAL / UMBILICAL / VENTRAL HERNIA REPAIR  05/25/2015   IHR   LEFT HEART CATHETERIZATION WITH CORONARY ANGIOGRAM N/A 07/20/2012   Procedure: LEFT HEART CATHETERIZATION WITH CORONARY ANGIOGRAM;  Surgeon: [name], MD;  Location[facility] CATH LAB;  Service: Cardiovascular;  Laterality: N/A;   MASTECTOMY Right 2002   MASTECTOMY MODIFIED RADICAL W/ AXILLARY LYMPH NODES W/ OR W/O PECTORALIS MINOR Left 2002   MAZE N/A 09/28/2012   Procedure: MAZE;  Surgeon: [name], MD;  Location: [place];  Service: Open Heart Surgery;  Laterality: N/A;   ORIF WRIST FRACTURE Right 11/08/2017   Procedure: OPEN REDUCTION INTERNAL FIXATION (ORIF) WRIST FRACTURE;  Surgeon: [name], MD;  Location: [place];  Service: Orthopedics;  Laterality: Right;  90 mins   PPM GENERATOR CHANGEOUT N/A 04/17/2020   Procedure: PPM GENERATOR CHANGEOUT;  Surgeon: [name], MD;  Location: [place] CV LAB;  Service: Cardiovascular;  Laterality: N/A;   TENDON REPAIR Right 2001 X 3-4   torn ligaments and tendons in ankle up to knee from work related accident   [place]    Social History:  reports that she quit smoking about 8 years ago. Her smoking use included cigarettes. She has a 10.00 pack-year smoking history. She has never used smokeless tobacco. She reports that she does not drink alcohol and does not use drugs.  Allergies: Allergies  Allergen Reactions   Statins Other (See Comments)    Myalgias with rosuvastatin, atorvastatin and  simvastatin     Family History:  Family History  Problem Relation Age of Onset   Kidney failure Mother    Heart disease Mother        Died mid 0000000 complications of diabetes   Diabetes Mother    Colon cancer Sister    Arthritis Other    Diabetes Other    Hyperlipidemia Other    Ovarian cancer Other    Liver cancer Brother    Stomach cancer Neg Hx    Rectal cancer Neg Hx    Esophageal cancer Neg Hx    Pancreatic cancer Neg Hx      Current Outpatient Medications:    APPLE CIDER VINEGAR PO, Take 5,220 mg by mouth in the morning, at noon, and at bedtime. 1740 mg each, Disp: , Rfl:    Blood Glucose Monitoring Suppl (TRUE METRIX AIR GLUCOSE METER) DEVI, Check blood sugar 2 times a day, Disp: 1 each, Rfl: 0   carvedilol (COREG) 12.5 MG tablet, TAKE 1 TABLET (12.5 MG) BY MOUTH 2 (TWO) TIMES DAILY  WITH A MEAL., Disp: 180 tablet, Rfl: 3   Dulaglutide (TRULICITY) 1.5 0000000 SOPN, INJECT 1.'5MG'$  (1 PEN) SUBCUTANEOUSLY EVERY WEEK (Patient taking differently: Inject 1.5 mg into the skin every Sunday.), Disp: 18 mL, Rfl: 1   empagliflozin (JARDIANCE) 25 MG TABS tablet, Take 1 tablet (25 mg total) by mouth daily before breakfast., Disp: 90 tablet, Rfl: 3   furosemide (LASIX) 20 MG tablet, TAKE 1 TABLET EVERY DAY (Patient taking differently: Take 20 mg by mouth daily.), Disp: 90 tablet, Rfl: 3   glipiZIDE (GLUCOTROL) 5 MG tablet, TAKE ONE TABLET BY MOUTH TWICE A DAY BEFORE A MEAL (Patient taking differently: Take 5 mg by mouth 2 (two) times daily before a meal.), Disp: 200 tablet, Rfl: 4   glucose blood (TRUE METRIX BLOOD GLUCOSE TEST) test strip, Use to check blood sugar 2 times a day., Disp: 200 each, Rfl: 12   isosorbide mononitrate (IMDUR) 60 MG 24 hr tablet, TAKE 1 TABLET EVERY DAY, Disp: 90 tablet, Rfl: 3   losartan (COZAAR) 25 MG tablet, TAKE 1 TABLET TWICE DAILY (Patient taking differently: Take 25 mg by mouth 2 (two) times daily.), Disp: 180 tablet, Rfl: 3   losartan (COZAAR) 25 MG tablet,  Take by mouth., Disp: , Rfl:    metFORMIN (GLUCOPHAGE) 1000 MG tablet, TAKE ONE TABLET BY MOUTH TWICE A DAY WITH FOOD (Patient taking differently: Take 1,000 mg by mouth 2 (two) times daily with a meal.), Disp: 200 tablet, Rfl: 4   metFORMIN (GLUCOPHAGE) 1000 MG tablet, Take by mouth., Disp: , Rfl:    nitroGLYCERIN (NITROSTAT) 0.4 MG SL tablet, Place 1 tablet (0.4 mg total) under the tongue every 5 (five) minutes as needed for chest pain., Disp: 25 tablet, Rfl: 3   pantoprazole (PROTONIX) 40 MG tablet, Take 1 tablet (40 mg total) by mouth daily., Disp: 30 tablet, Rfl: 1   REPATHA SURECLICK [id number] MG/ML SOAJ, INJECT 140 MG INTO THE SKIN EVERY 14 (FOURTEEN) DAYS., Disp: 6 mL, Rfl: 3   TRUEplus Lancets 33G MISC, Use to check blood sugar 2 times a day., Disp: 200 each, Rfl: 12   XARELTO 20 MG TABS tablet, TAKE 1 TABLET (20 MG TOTAL) BY MOUTH DAILY WITH SUPPER., Disp: 90 tablet, Rfl: 3  Review of Systems:  Constitutional: Denies fever, chills, diaphoresis, appetite change and fatigue.  HEENT: Denies photophobia, eye pain, redness, hearing loss, ear pain, congestion, sore throat, rhinorrhea, sneezing, mouth sores, trouble swallowing, neck pain, neck stiffness and tinnitus.   Respiratory: Denies SOB, DOE, cough, chest tightness,  and wheezing.   Cardiovascular: Denies chest pain, palpitations and leg swelling.  Gastrointestinal: Denies nausea, vomiting, abdominal pain, diarrhea, constipation, blood in stool and abdominal distention.  Genitourinary: Denies dysuria, urgency, frequency, hematuria, flank pain and difficulty urinating.  Endocrine: Denies: hot or cold intolerance, sweats, changes in hair or nails, polyuria, polydipsia. Musculoskeletal: Denies myalgias, back pain, joint swelling, arthralgias and gait problem.  Neurological: Denies dizziness, seizures, syncope, weakness, light-headedness, numbness and headaches.  Hematological: Denies adenopathy. Easy bruising, personal or family bleeding history   Psychiatric/Behavioral: Denies suicidal ideation, mood changes, confusion, nervousness, sleep disturbance and agitation    Physical Exam: Vitals:   11/17/20 0751  BP: 140/80  Pulse: 92  Temp: 98.2 F (36.8 C)  TempSrc: Oral  SpO2: 97%  Weight: 151 lb 6.4 oz (68.7 kg)    Body mass index is 25.99 kg/m.   Constitutional: NAD, calm, comfortable Eyes: PERRL, lids and conjunctivae normal ENMT: Mucous membranes are moist.  Skin: Large raised  red macules over forehead, cheeks and neck Neurologic: Grossly intact and nonfocal Psychiatric: Normal judgment and insight. Alert and oriented x 3. Normal mood.    Impression and Plan:  Hives -Have advised daily use of Claritin in the morning and Benadryl at night, hesitant to use steroids due to diabetes. -If no improvement can consider allergy referral.  Needs flu shot  - Plan: Flu Vaccine QUAD High Dose(Fluad)      Lelon Frohlich, MD [facility]

## 2020-11-19 ENCOUNTER — Other Ambulatory Visit: Payer: Self-pay | Admitting: Internal Medicine

## 2020-11-19 DIAGNOSIS — E1159 Type 2 diabetes mellitus with other circulatory complications: Secondary | ICD-10-CM

## 2020-11-20 ENCOUNTER — Other Ambulatory Visit: Payer: Self-pay

## 2020-11-21 ENCOUNTER — Ambulatory Visit (INDEPENDENT_AMBULATORY_CARE_PROVIDER_SITE_OTHER): Payer: Medicare HMO | Admitting: Internal Medicine

## 2020-11-21 VITALS — BP 124/80 | HR 55 | Temp 98.3°F

## 2020-11-21 DIAGNOSIS — L509 Urticaria, unspecified: Secondary | ICD-10-CM

## 2020-11-21 NOTE — Progress Notes (Signed)
Acute office Visit     This visit occurred during the SARS-CoV-2 public health emergency.  Safety protocols were in place, including screening questions prior to the visit, additional usage of staff PPE, and extensive cleaning of exam room while observing appropriate contact time as indicated for disinfecting solutions.    CC/Reason for Visit: Recurrent hives  HPI: [name] is a 70 y.o. female who is coming in today for the above mentioned reasons.  She was seen on 9/9 for the same.  She was advised to take antihistamines.  At that time she denied new medication or topical OTC creams, lotions, detergents.  Hives are mostly over her forehead and neck.  Are barely visible today, however she tells me that on Sunday she was unable to go to church due to them.  They are very itchy.  She denies trouble swallowing.  Past Medical/Surgical History: Past Medical History:  Diagnosis Date   Acute on chronic combined systolic and diastolic CHF, NYHA class 3 ([place]) 03/19/2013   AICD (automatic cardioverter/defibrillator) present 01/2013   Biventricular cardiac pacemaker in situ    Allergic rhinitis    Anogenital warts 01/22/2011   Anxiety state, unspecified 05/13/2013   BREAST CANCER, HX OF 09/19/2009   Qualifier: Diagnosis of  By: [name], [name]    CAD (coronary artery disease)    a. 2004: s/p MI in [place]. No PCI->Medical RX;  b. 07/2012 Cath: LM 30-40, LAD 70p, 70/3m D1 80-90p, OM1 small 90p, OM2 large 80-90p, 521m70-80d, RCA 20-30 diff, EF 40%, glob HK.s/p CABG   [place]   Patient reports left breast cancer diagnosis in 2002 treated with bilateral mastectomy positive lymph nodes with left axillary dissection followed by chemotherapy of unknown type   Cataract    Chronic combined systolic and diastolic CHF, NYHA class 2 ([place]10/31/2014   Diabetes mellitus type II    Exertional dyspnea 08/05/2014   Exertional shortness of breath    Generalized osteoarthrosis,  involving multiple sites 08/25/2015   Hyperlipidemia    Hypertension    Incisional hernia, without obstruction or gangrene 05/04/2015   Ischemic cardiomyopathy    a. 07/2012 Echo: EF 35%, Sev inferoseptal HK, mildly dil LA, Peak PASP 5941m.   LBBB (left bundle branch block)    a. intermittent - present during rapid afib 07/2012.   MYOCARDIAL INFARCTION, HX OF 09/19/2009   Qualifier: Diagnosis of  By: TodSherren Mocha, JefJory Ee [facility]rom[facility] ([facility]  Patient reports chronic numbness in the right foot related to previous surgery on the right leg and "nerve damage"   PAF (paroxysmal atrial fibrillation) ([place]  a. 07/2012: Amio and xarelto initiated.   Right carotid bruit 08/11/2012   SBO (small bowel obstruction) (HCC)    SOB (shortness of breath) 02/26/2018   Type 2 diabetes mellitus with circulatory disorder, without long-term current use of insulin ([place]/25/2017   Vitamin D deficiency 02/22/2016    Past Surgical History:  Procedure Laterality Date   ABDOMINAL HYSTERECTOMY  2000   APPENDECTOMY  1974   BI-VENTRICULAR PACEMAKER INSERTION N/A 01/25/2013   Procedure: BI-VENTRICULAR PACEMAKER INSERTION (CRT-P);  Surgeon: GreEvans LanceD;  [facility];  Service: Cardiovascular;  Laterality: N/A;   BREAST BIOPSY Left 2002   CARDIAC CATHETERIZATION     CHOLECYSTECTOMY OPEN  1974   CORONARY ARTERY BYPASS GRAFT N/A 09/28/2012   Procedure: CORONARY ARTERY BYPASS GRAFTING (CABG);  Surgeon:  [name], MD;  Location: [place];  Service: Open Heart Surgery;  Laterality: N/A;  CABG x four, using left internal mammary artery and left leg greater saphenous vein harvested endoscopically   DILATION AND CURETTAGE OF UTERUS  1983   EPICARDIAL PACING LEAD PLACEMENT N/A 09/28/2012   Procedure: EPICARDIAL PACING LEAD PLACEMENT;  Surgeon: [name], MD;  Location: [place];  Service: Thoracic;  Laterality: N/A;  LV LEAD PLACEMENT   HERNIA REPAIR     INCISIONAL HERNIA REPAIR N/A  05/25/2015   Procedure: LAPAROSCOPIC INCISIONAL HERNIA WITH MESH ;  Surgeon: [name], MD;  Location: [place];  Service: General;  Laterality: N/A;   INCONTINENCE SURGERY  2000   INSERTION OF MESH N/A 05/25/2015   Procedure: INSERTION OF MESH;  Surgeon: [name], MD;  Location: [place];  Service: General;  Laterality: N/A;   INTRAOPERATIVE TRANSESOPHAGEAL ECHOCARDIOGRAM N/A 09/28/2012   Procedure: INTRAOPERATIVE TRANSESOPHAGEAL ECHOCARDIOGRAM;  Surgeon: [name], MD;  Location: [place];  Service: Open Heart Surgery;  Laterality: N/A;   LAPAROSCOPIC INCISIONAL / UMBILICAL / VENTRAL HERNIA REPAIR  05/25/2015   IHR   LEFT HEART CATHETERIZATION WITH CORONARY ANGIOGRAM N/A 07/20/2012   Procedure: LEFT HEART CATHETERIZATION WITH CORONARY ANGIOGRAM;  Surgeon: [name], MD;  [facility];  Service: Cardiovascular;  Laterality: N/A;   MASTECTOMY Right 2002   MASTECTOMY MODIFIED RADICAL W/ AXILLARY LYMPH NODES W/ OR W/O PECTORALIS MINOR Left 2002   MAZE N/A 09/28/2012   Procedure: MAZE;  Surgeon: [name], MD;  Location: [place];  Service: Open Heart Surgery;  Laterality: N/A;   ORIF WRIST FRACTURE Right 11/08/2017   Procedure: OPEN REDUCTION INTERNAL FIXATION (ORIF) WRIST FRACTURE;  Surgeon: [name], MD;  Location: [place];  Service: Orthopedics;  Laterality: Right;  90 mins   PPM GENERATOR CHANGEOUT N/A 04/17/2020   Procedure: PPM GENERATOR CHANGEOUT;  Surgeon: [name], MD;  Location: [place] CV LAB;  Service: Cardiovascular;  Laterality: N/A;   TENDON REPAIR Right 2001 X 3-4   torn ligaments and tendons in ankle up to knee from work related accident   [place]    Social History:  reports that she quit smoking about 8 years ago. Her smoking use included cigarettes. She has a 10.00 pack-year smoking history. She has never used smokeless tobacco. She reports that she does not drink alcohol and does not use drugs.  Allergies: Allergies   Allergen Reactions   Statins Other (See Comments)    Myalgias with rosuvastatin, atorvastatin and simvastatin     Family History:  Family History  Problem Relation Age of Onset   Kidney failure Mother    Heart disease Mother        Died mid 0000000 complications of diabetes   Diabetes Mother    Colon cancer Sister    Arthritis Other    Diabetes Other    Hyperlipidemia Other    Ovarian cancer Other    Liver cancer Brother    Stomach cancer Neg Hx    Rectal cancer Neg Hx    Esophageal cancer Neg Hx    Pancreatic cancer Neg Hx      Current Outpatient Medications:    APPLE CIDER VINEGAR PO, Take 5,220 mg by mouth in the morning, at noon, and at bedtime. 1740 mg each, Disp: , Rfl:    Blood Glucose Monitoring Suppl (TRUE METRIX AIR GLUCOSE METER) DEVI, Check blood sugar 2 times a day, Disp:  1 each, Rfl: 0   carvedilol (COREG) 12.5 MG tablet, TAKE 1 TABLET (12.5 MG) BY MOUTH 2 (TWO) TIMES DAILY WITH A MEAL., Disp: 180 tablet, Rfl: 3   Dulaglutide (TRULICITY) 1.5 0000000 SOPN, INJECT 1.'5MG'$  (1 PEN) SUBCUTANEOUSLY EVERY WEEK (Patient taking differently: Inject 1.5 mg into the skin every Sunday.), Disp: 18 mL, Rfl: 1   empagliflozin (JARDIANCE) 25 MG TABS tablet, Take 1 tablet (25 mg total) by mouth daily before breakfast., Disp: 90 tablet, Rfl: 3   furosemide (LASIX) 20 MG tablet, TAKE 1 TABLET EVERY DAY (Patient taking differently: Take 20 mg by mouth daily.), Disp: 90 tablet, Rfl: 3   glipiZIDE (GLUCOTROL) 5 MG tablet, TAKE ONE TABLET BY MOUTH TWICE A DAY BEFORE A MEAL (Patient taking differently: Take 5 mg by mouth 2 (two) times daily before a meal.), Disp: 200 tablet, Rfl: 4   glucose blood (TRUE METRIX BLOOD GLUCOSE TEST) test strip, Use to check blood sugar 2 times a day., Disp: 200 each, Rfl: 12   isosorbide mononitrate (IMDUR) 60 MG 24 hr tablet, TAKE 1 TABLET EVERY DAY, Disp: 90 tablet, Rfl: 3   losartan (COZAAR) 25 MG tablet, TAKE 1 TABLET TWICE DAILY (Patient taking differently:  Take 25 mg by mouth 2 (two) times daily.), Disp: 180 tablet, Rfl: 3   losartan (COZAAR) 25 MG tablet, Take by mouth., Disp: , Rfl:    metFORMIN (GLUCOPHAGE) 1000 MG tablet, TAKE ONE TABLET BY MOUTH TWICE A DAY WITH FOOD (Patient taking differently: Take 1,000 mg by mouth 2 (two) times daily with a meal.), Disp: 200 tablet, Rfl: 4   metFORMIN (GLUCOPHAGE) 1000 MG tablet, Take by mouth., Disp: , Rfl:    nitroGLYCERIN (NITROSTAT) 0.4 MG SL tablet, Place 1 tablet (0.4 mg total) under the tongue every 5 (five) minutes as needed for chest pain., Disp: 25 tablet, Rfl: 3   pantoprazole (PROTONIX) 40 MG tablet, Take 1 tablet (40 mg total) by mouth daily., Disp: 30 tablet, Rfl: 1   REPATHA SURECLICK XX123456 MG/ML SOAJ, INJECT 140 MG INTO THE SKIN EVERY 14 (FOURTEEN) DAYS., Disp: 6 mL, Rfl: 3   TRUEplus Lancets 33G MISC, Use to check blood sugar 2 times a day., Disp: 200 each, Rfl: 12   XARELTO 20 MG TABS tablet, TAKE 1 TABLET (20 MG TOTAL) BY MOUTH DAILY WITH SUPPER., Disp: 90 tablet, Rfl: 3  Review of Systems:  Constitutional: Denies fever, chills, diaphoresis, appetite change and fatigue.  HEENT: Denies photophobia, eye pain, redness, hearing loss, ear pain, congestion, sore throat, rhinorrhea, sneezing, mouth sores, trouble swallowing, neck pain, neck stiffness and tinnitus.   Respiratory: Denies SOB, DOE, cough, chest tightness,  and wheezing.   Cardiovascular: Denies chest pain, palpitations and leg swelling.  Gastrointestinal: Denies nausea, vomiting, abdominal pain, diarrhea, constipation, blood in stool and abdominal distention.  Genitourinary: Denies dysuria, urgency, frequency, hematuria, flank pain and difficulty urinating.  Endocrine: Denies: hot or cold intolerance, sweats, changes in hair or nails, polyuria, polydipsia. Musculoskeletal: Denies myalgias, back pain, joint swelling, arthralgias and gait problem.  Skin: Denies pallor and wound.  Neurological: Denies dizziness, seizures, syncope,  weakness, light-headedness, numbness and headaches.  Hematological: Denies adenopathy. Easy bruising, personal or family bleeding history  Psychiatric/Behavioral: Denies suicidal ideation, mood changes, confusion, nervousness, sleep disturbance and agitation    Physical Exam: Vitals:   11/21/20 0654  BP: 124/80  Pulse: (!) 55  Temp: 98.3 F (36.8 C)  TempSrc: Oral  SpO2: 97%    There is no height or weight  on file to calculate BMI.   Constitutional: NAD, calm, comfortable Eyes: PERRL, lids and conjunctivae normal ENMT: Mucous membranes are moist.  Skin: Some red splotches that are barely visible over her forehead and neck. Neurologic: Grossly intact and nonfocal Psychiatric: Normal judgment and insight. Alert and oriented x 3. Normal mood.    Impression and Plan:  Hives  - Plan: Ambulatory referral to Allergy -Continue antihistamines twice daily, will refer to allergist.    Michelle Frohlich, MD [facility] at [facility]

## 2020-11-26 ENCOUNTER — Other Ambulatory Visit: Payer: Self-pay | Admitting: Cardiovascular Disease

## 2020-11-26 DIAGNOSIS — I10 Essential (primary) hypertension: Secondary | ICD-10-CM

## 2020-12-04 ENCOUNTER — Encounter: Payer: Medicare HMO | Admitting: Cardiovascular Disease

## 2020-12-06 ENCOUNTER — Encounter: Payer: Self-pay | Admitting: Internal Medicine

## 2020-12-06 DIAGNOSIS — J309 Allergic rhinitis, unspecified: Secondary | ICD-10-CM

## 2020-12-07 ENCOUNTER — Telehealth: Payer: Medicare HMO

## 2020-12-29 DIAGNOSIS — Z299 Encounter for prophylactic measures, unspecified: Secondary | ICD-10-CM | POA: Diagnosis not present

## 2020-12-29 DIAGNOSIS — J300 Vasomotor rhinitis: Secondary | ICD-10-CM | POA: Diagnosis not present

## 2020-12-29 DIAGNOSIS — L501 Idiopathic urticaria: Secondary | ICD-10-CM | POA: Diagnosis not present

## 2021-01-16 ENCOUNTER — Ambulatory Visit (INDEPENDENT_AMBULATORY_CARE_PROVIDER_SITE_OTHER): Payer: Medicare HMO

## 2021-01-16 DIAGNOSIS — I5042 Chronic combined systolic (congestive) and diastolic (congestive) heart failure: Secondary | ICD-10-CM

## 2021-01-16 LAB — CUP PACEART REMOTE DEVICE CHECK
Battery Remaining Longevity: 74 mo
Battery Remaining Percentage: 90 %
Battery Voltage: 2.99 V
Brady Statistic AP VP Percent: 1 %
Brady Statistic AP VS Percent: 1 %
Brady Statistic AS VP Percent: 99 %
Brady Statistic AS VS Percent: 1 %
Brady Statistic RA Percent Paced: 1 %
Date Time Interrogation Session: 20221107074316
Implantable Lead Implant Date: 20140721
Implantable Lead Implant Date: 20141117
Implantable Lead Implant Date: 20141117
Implantable Lead Location: 753858
Implantable Lead Location: 753859
Implantable Lead Location: 753860
Implantable Lead Model: 5071
Implantable Pulse Generator Implant Date: 20220207
Lead Channel Impedance Value: 400 Ohm
Lead Channel Impedance Value: 410 Ohm
Lead Channel Impedance Value: 590 Ohm
Lead Channel Pacing Threshold Amplitude: 0.5 V
Lead Channel Pacing Threshold Amplitude: 0.75 V
Lead Channel Pacing Threshold Amplitude: 1.5 V
Lead Channel Pacing Threshold Pulse Width: 0.4 ms
Lead Channel Pacing Threshold Pulse Width: 0.4 ms
Lead Channel Pacing Threshold Pulse Width: 0.5 ms
Lead Channel Sensing Intrinsic Amplitude: 0.8 mV
Lead Channel Sensing Intrinsic Amplitude: 12 mV
Lead Channel Setting Pacing Amplitude: 2 V
Lead Channel Setting Pacing Amplitude: 2.5 V
Lead Channel Setting Pacing Amplitude: 2.5 V
Lead Channel Setting Pacing Pulse Width: 0.4 ms
Lead Channel Setting Pacing Pulse Width: 0.5 ms
Lead Channel Setting Sensing Sensitivity: 2 mV
Pulse Gen Model: 3222
Pulse Gen Serial Number: 3859407

## 2021-01-17 ENCOUNTER — Other Ambulatory Visit: Payer: Self-pay | Admitting: Cardiovascular Disease

## 2021-01-17 DIAGNOSIS — I252 Old myocardial infarction: Secondary | ICD-10-CM

## 2021-01-17 DIAGNOSIS — E782 Mixed hyperlipidemia: Secondary | ICD-10-CM

## 2021-01-18 ENCOUNTER — Telehealth: Payer: Self-pay

## 2021-01-18 NOTE — Telephone Encounter (Signed)
Last OV of 11/09/20 recommendation to Return in about 3 months (around 02/08/2021) for blood pressure, dm. No appt noted at this time.  Pt notified of above & appt scheduled for 02/08/21

## 2021-01-24 NOTE — Progress Notes (Signed)
Remote pacemaker transmission.   

## 2021-01-27 ENCOUNTER — Other Ambulatory Visit: Payer: Self-pay | Admitting: Internal Medicine

## 2021-01-27 DIAGNOSIS — E1159 Type 2 diabetes mellitus with other circulatory complications: Secondary | ICD-10-CM

## 2021-02-08 ENCOUNTER — Ambulatory Visit (INDEPENDENT_AMBULATORY_CARE_PROVIDER_SITE_OTHER): Payer: Medicare HMO | Admitting: Internal Medicine

## 2021-02-08 ENCOUNTER — Encounter: Payer: Self-pay | Admitting: Internal Medicine

## 2021-02-08 VITALS — BP 110/70 | HR 85 | Temp 98.2°F | Wt 152.2 lb

## 2021-02-08 DIAGNOSIS — I10 Essential (primary) hypertension: Secondary | ICD-10-CM

## 2021-02-08 DIAGNOSIS — I480 Paroxysmal atrial fibrillation: Secondary | ICD-10-CM

## 2021-02-08 DIAGNOSIS — I25810 Atherosclerosis of coronary artery bypass graft(s) without angina pectoris: Secondary | ICD-10-CM | POA: Diagnosis not present

## 2021-02-08 DIAGNOSIS — E1159 Type 2 diabetes mellitus with other circulatory complications: Secondary | ICD-10-CM

## 2021-02-08 DIAGNOSIS — Z1231 Encounter for screening mammogram for malignant neoplasm of breast: Secondary | ICD-10-CM | POA: Diagnosis not present

## 2021-02-08 DIAGNOSIS — Z1211 Encounter for screening for malignant neoplasm of colon: Secondary | ICD-10-CM | POA: Diagnosis not present

## 2021-02-08 DIAGNOSIS — I5042 Chronic combined systolic (congestive) and diastolic (congestive) heart failure: Secondary | ICD-10-CM | POA: Diagnosis not present

## 2021-02-08 DIAGNOSIS — J069 Acute upper respiratory infection, unspecified: Secondary | ICD-10-CM | POA: Diagnosis not present

## 2021-02-08 DIAGNOSIS — E782 Mixed hyperlipidemia: Secondary | ICD-10-CM

## 2021-02-08 LAB — POCT GLYCOSYLATED HEMOGLOBIN (HGB A1C): Hemoglobin A1C: 6.7 % — AB (ref 4.0–5.6)

## 2021-02-08 MED ORDER — BENZONATATE 100 MG PO CAPS: 100.0000 mg | ORAL_CAPSULE | Freq: Two times a day (BID) | ORAL | 0 refills | Status: DC | PRN

## 2021-02-08 NOTE — Progress Notes (Signed)
Established Patient Office Visit     This visit occurred during the SARS-CoV-2 public health emergency.  Safety protocols were in place, including screening questions prior to the visit, additional usage of staff PPE, and extensive cleaning of exam room while observing appropriate contact time as indicated for disinfecting solutions.    CC/Reason for Visit: 72-month follow-up chronic medical conditions  HPI: [name] is a 70 y.o. female who is coming in today for the above mentioned reasons. Past Medical History is significant for: Coronary artery disease status post CABG with a biventricular pacer with recovered EF, atrial fibrillation anticoagulated on Xarelto, diabetes, hyperlipidemia.  She has been doing well and has no acute concerns or complaints.  We have gone over her medication list and she is compliant with them well.  She recently had some laryngitis and is wanting a prescription for cough medication.  All immunizations are up-to-date with the exception of COVID which she declines.  She is overdue for colonoscopy and mammogram and is okay with [place] placing those referrals today.   Past Medical/Surgical History: Past Medical History:  Diagnosis Date   Acute on chronic combined systolic and diastolic CHF, NYHA class 3 ([place]) 03/19/2013   AICD (automatic cardioverter/defibrillator) present 01/2013   Biventricular cardiac pacemaker in situ    Allergic rhinitis    Anogenital warts 01/22/2011   Anxiety state, unspecified 05/13/2013   BREAST CANCER, HX OF 09/19/2009   Qualifier: Diagnosis of  By: [name], [name]    CAD (coronary artery disease)    a. 2004: s/p MI in [place]. No PCI->Medical RX;  b. 07/2012 Cath: LM 30-40, LAD 70p, 70/65m, D1 80-90p, OM1 small 90p, OM2 large 80-90p, 32m, 70-80d, RCA 20-30 diff, EF 40%, glob HK.s/p CABG   Cancer of left breast ([place]) 2002   Patient reports left breast cancer diagnosis in 2002 treated with bilateral mastectomy positive lymph nodes  with left axillary dissection followed by chemotherapy of unknown type   Cataract    Chronic combined systolic and diastolic CHF, NYHA class 2 ([place]) 01/08/2013   Diabetes mellitus type II    Exertional dyspnea 08/05/2014   Exertional shortness of breath    Generalized osteoarthrosis, involving multiple sites 08/25/2015   Hyperlipidemia    Hypertension    Incisional hernia, without obstruction or gangrene 05/04/2015   Ischemic cardiomyopathy    a. 07/2012 Echo: EF 35%, Sev inferoseptal HK, mildly dil LA, Peak PASP 27mmHg.   LBBB (left bundle branch block)    a. intermittent - present during rapid afib 07/2012.   MYOCARDIAL INFARCTION, HX OF 09/19/2009   Qualifier: Diagnosis of  By: [name], Jory Ee    Neuromuscular disorder [facility])    Patient reports chronic numbness in the right foot related to previous surgery on the right leg and "nerve damage"   PAF (paroxysmal atrial fibrillation) ([place])    a. 07/2012: Amio and xarelto initiated.   Right carotid bruit 08/11/2012   SBO (small bowel obstruction) (HCC)    SOB (shortness of breath) 02/26/2018   Type 2 diabetes mellitus with circulatory disorder, without long-term current use of insulin ([place]) 10/03/2015   Vitamin D deficiency 02/22/2016    Past Surgical History:  Procedure Laterality Date   ABDOMINAL HYSTERECTOMY  2000   APPENDECTOMY  1974   BI-VENTRICULAR PACEMAKER INSERTION N/A 01/25/2013   Procedure: BI-VENTRICULAR PACEMAKER INSERTION (CRT-P);  Surgeon: Evans Lance, MD;  Location: [facility] CATH LAB;  Service: Cardiovascular;  Laterality: N/A;   BREAST  BIOPSY Left 2002   CARDIAC CATHETERIZATION     CHOLECYSTECTOMY OPEN  1974   CORONARY ARTERY BYPASS GRAFT N/A 09/28/2012   Procedure: CORONARY ARTERY BYPASS GRAFTING (CABG);  Surgeon: [name], MD;  Location: [place];  Service: Open Heart Surgery;  Laterality: N/A;  CABG x four, using left internal mammary artery and left leg greater saphenous vein harvested endoscopically   DILATION AND  CURETTAGE OF UTERUS  1983   EPICARDIAL PACING LEAD PLACEMENT N/A 09/28/2012   Procedure: EPICARDIAL PACING LEAD PLACEMENT;  Surgeon: [name], MD;  Location: [place];  Service: Thoracic;  Laterality: N/A;  LV LEAD PLACEMENT   HERNIA REPAIR     INCISIONAL HERNIA REPAIR N/A 05/25/2015   Procedure: LAPAROSCOPIC INCISIONAL HERNIA WITH MESH ;  Surgeon: [name], MD;  Location: [place];  Service: General;  Laterality: N/A;   INCONTINENCE SURGERY  2000   INSERTION OF MESH N/A 05/25/2015   Procedure: INSERTION OF MESH;  Surgeon: [name], MD;  Location: [place];  Service: General;  Laterality: N/A;   INTRAOPERATIVE TRANSESOPHAGEAL ECHOCARDIOGRAM N/A 09/28/2012   Procedure: INTRAOPERATIVE TRANSESOPHAGEAL ECHOCARDIOGRAM;  Surgeon: [name], MD;  Location: [place];  Service: Open Heart Surgery;  Laterality: N/A;   LAPAROSCOPIC INCISIONAL / UMBILICAL / VENTRAL HERNIA REPAIR  05/25/2015   IHR   LEFT HEART CATHETERIZATION WITH CORONARY ANGIOGRAM N/A 07/20/2012   Procedure: LEFT HEART CATHETERIZATION WITH CORONARY ANGIOGRAM;  Surgeon: [name], MD;  Location[facility] CATH LAB;  Service: Cardiovascular;  Laterality: N/A;   MASTECTOMY Right 2002   MASTECTOMY MODIFIED RADICAL W/ AXILLARY LYMPH NODES W/ OR W/O PECTORALIS MINOR Left 2002   MAZE N/A 09/28/2012   Procedure: MAZE;  Surgeon: [name], MD;  Location: [place];  Service: Open Heart Surgery;  Laterality: N/A;   ORIF WRIST FRACTURE Right 11/08/2017   Procedure: OPEN REDUCTION INTERNAL FIXATION (ORIF) WRIST FRACTURE;  Surgeon: [name], MD;  Location: [place];  Service: Orthopedics;  Laterality: Right;  90 mins   PPM GENERATOR CHANGEOUT N/A 04/17/2020   Procedure: PPM GENERATOR CHANGEOUT;  Surgeon: [name], MD;  Location: [place] CV LAB;  Service: Cardiovascular;  Laterality: N/A;   TENDON REPAIR Right 2001 X 3-4   torn ligaments and tendons in ankle up to knee from work related accident   [place]     Social History:  reports that she quit smoking about 8 years ago. Her smoking use included cigarettes. She has a 10.00 pack-year smoking history. She has never used smokeless tobacco. She reports that she does not drink alcohol and does not use drugs.  Allergies: Allergies  Allergen Reactions   Statins Other (See Comments)    Myalgias with rosuvastatin, atorvastatin and simvastatin     Family History:  Family History  Problem Relation Age of Onset   Kidney failure Mother    Heart disease Mother        Died mid 32D complications of diabetes   Diabetes Mother    Colon cancer Sister    Arthritis Other    Diabetes Other    Hyperlipidemia Other    Ovarian cancer Other    Liver cancer Brother    Stomach cancer Neg Hx    Rectal cancer Neg Hx    Esophageal cancer Neg Hx    Pancreatic cancer Neg Hx      Current Outpatient Medications:    APPLE CIDER VINEGAR PO, Take 5,220 mg by mouth in the  morning, at noon, and at bedtime. 1740 mg each, Disp: , Rfl:    benzonatate (TESSALON) 100 MG capsule, Take 1 capsule (100 mg total) by mouth 2 (two) times daily as needed for cough., Disp: 20 capsule, Rfl: 0   Blood Glucose Monitoring Suppl (TRUE METRIX AIR GLUCOSE METER) DEVI, Check blood sugar 2 times a day, Disp: 1 each, Rfl: 0   carvedilol (COREG) 12.5 MG tablet, TAKE 1 TABLET (12.5 MG) BY MOUTH 2 (TWO) TIMES DAILY WITH A MEAL., Disp: 180 tablet, Rfl: 3   furosemide (LASIX) 20 MG tablet, TAKE 1 TABLET EVERY DAY, Disp: 90 tablet, Rfl: 3   glipiZIDE (GLUCOTROL) 5 MG tablet, TAKE 1 TABLET TWICE DAILY BEFORE MEALS, Disp: 180 tablet, Rfl: 0   glucose blood (TRUE METRIX BLOOD GLUCOSE TEST) test strip, Use to check blood sugar 2 times a day., Disp: 200 each, Rfl: 12   isosorbide mononitrate (IMDUR) 60 MG 24 hr tablet, TAKE 1 TABLET EVERY DAY, Disp: 90 tablet, Rfl: 3   JARDIANCE 25 MG TABS tablet, TAKE 1 TABLET (25 MG TOTAL) BY MOUTH DAILY BEFORE BREAKFAST., Disp: 90 tablet, Rfl: 1   losartan  (COZAAR) 25 MG tablet, TAKE 1 TABLET TWICE DAILY (Patient taking differently: Take 25 mg by mouth 2 (two) times daily.), Disp: 180 tablet, Rfl: 3   losartan (COZAAR) 25 MG tablet, Take by mouth., Disp: , Rfl:    losartan (COZAAR) 25 MG tablet, TAKE 1 TABLET TWICE DAILY, Disp: 180 tablet, Rfl: 0   metFORMIN (GLUCOPHAGE) 1000 MG tablet, TAKE ONE TABLET BY MOUTH TWICE A DAY WITH FOOD (Patient taking differently: Take 1,000 mg by mouth 2 (two) times daily with a meal.), Disp: 200 tablet, Rfl: 4   metFORMIN (GLUCOPHAGE) 1000 MG tablet, Take by mouth., Disp: , Rfl:    metFORMIN (GLUCOPHAGE) 1000 MG tablet, TAKE ONE TABLET BY MOUTH TWICE A DAY WITH FOOD, Disp: 180 tablet, Rfl: 0   nitroGLYCERIN (NITROSTAT) 0.4 MG SL tablet, Place 1 tablet (0.4 mg total) under the tongue every 5 (five) minutes as needed for chest pain., Disp: 25 tablet, Rfl: 3   REPATHA SURECLICK 235 MG/ML SOAJ, INJECT 140 MG INTO THE SKIN EVERY 14 (FOURTEEN) DAYS., Disp: 6 mL, Rfl: 3   TRUEplus Lancets 33G MISC, Use to check blood sugar 2 times a day., Disp: 200 each, Rfl: 12   TRULICITY 1.5 TD/3.2KG SOPN, INJECT 1.5MG  (1 PEN) SUBCUTANEOUSLY EVERY WEEK, Disp: 3 mL, Rfl: 0   XARELTO 20 MG TABS tablet, TAKE 1 TABLET (20 MG TOTAL) BY MOUTH DAILY WITH SUPPER., Disp: 90 tablet, Rfl: 3  Review of Systems:  Constitutional: Denies fever, chills, diaphoresis, appetite change and fatigue.  HEENT: Denies photophobia, eye pain, redness, hearing loss, ear pain,  mouth sores, trouble swallowing, neck pain, neck stiffness and tinnitus.   Respiratory: Denies SOB, DOE, cough, chest tightness,  and wheezing.   Cardiovascular: Denies chest pain, palpitations and leg swelling.  Gastrointestinal: Denies nausea, vomiting, abdominal pain, diarrhea, constipation, blood in stool and abdominal distention.  Genitourinary: Denies dysuria, urgency, frequency, hematuria, flank pain and difficulty urinating.  Endocrine: Denies: hot or cold intolerance, sweats, changes  in hair or nails, polyuria, polydipsia. Musculoskeletal: Denies myalgias, back pain, joint swelling, arthralgias and gait problem.  Skin: Denies pallor, rash and wound.  Neurological: Denies dizziness, seizures, syncope, weakness, light-headedness, numbness and headaches.  Hematological: Denies adenopathy. Easy bruising, personal or family bleeding history  Psychiatric/Behavioral: Denies suicidal ideation, mood changes, confusion, nervousness, sleep disturbance and agitation  Physical Exam: Vitals:   02/08/21 0707  BP: 110/70  Pulse: 85  Temp: 98.2 F (36.8 C)  TempSrc: Oral  SpO2: 97%  Weight: 152 lb 3.2 oz (69 kg)    Body mass index is 26.13 kg/m.   Constitutional: NAD, calm, comfortable Eyes: PERRL, lids and conjunctivae normal ENMT: Mucous membranes are moist.  Respiratory: clear to auscultation bilaterally, no wheezing, no crackles. Normal respiratory effort. No accessory muscle use.  Cardiovascular: Regular rate and rhythm, no murmurs / rubs / gallops. No extremity edema.  Neurologic: Grossly intact and nonfocal Psychiatric: Normal judgment and insight. Alert and oriented x 3. Normal mood.    Impression and Plan:  Type 2 diabetes mellitus with other circulatory complication, without long-term current use of insulin ([place])  - Plan: POCT glycosylated hemoglobin (Hb A1C) -In office A1c demonstrates excellent control at 6.7.  Essential hypertension -Excellent control.  Mixed hyperlipidemia -Last lipid panel in May 2022 with a total cholesterol of 134, triglycerides 157 and LDL 48.  She is currently on Repatha.  Coronary artery disease involving coronary bypass graft of native heart without angina pectoris Chronic combined systolic and diastolic CHF, NYHA class 2 (HCC) -Followed closely by cardiology, compensated.  PAF (paroxysmal atrial fibrillation) (HCC) -Rate controlled on carvedilol, anticoagulated on Xarelto, followed by cardiology.  Viral URI with cough   - Plan: benzonatate (TESSALON) 100 MG capsule -She took a negative COVID test yesterday.  Screen for colon cancer -GI referral for screening colonoscopy as her last one was in 2012.  Encounter for screening mammogram for malignant neoplasm of breast -Mammogram order placed today.   Time spent: 33 minutes reviewing chart, interviewing and examining patient and formulating plan of care.    Lelon Frohlich, MD [facility] at [facility]

## 2021-02-08 NOTE — Addendum Note (Signed)
Addended by: Westley Hummer B on: 02/08/2021 08:06 AM   Modules accepted: Orders

## 2021-02-10 ENCOUNTER — Encounter: Payer: Self-pay | Admitting: Internal Medicine

## 2021-03-28 ENCOUNTER — Encounter: Payer: Self-pay | Admitting: Cardiovascular Disease

## 2021-04-03 ENCOUNTER — Ambulatory Visit
Admission: RE | Admit: 2021-04-03 | Discharge: 2021-04-03 | Disposition: A | Discharge status: Home / Self Care | Payer: Medicare HMO | Source: Ambulatory Visit | Attending: Internal Medicine | Admitting: Internal Medicine

## 2021-04-03 DIAGNOSIS — Z1231 Encounter for screening mammogram for malignant neoplasm of breast: Secondary | ICD-10-CM

## 2021-04-09 ENCOUNTER — Ambulatory Visit (INDEPENDENT_AMBULATORY_CARE_PROVIDER_SITE_OTHER): Payer: Medicare HMO | Admitting: Internal Medicine

## 2021-04-09 ENCOUNTER — Other Ambulatory Visit: Payer: Self-pay | Admitting: Cardiovascular Disease

## 2021-04-09 ENCOUNTER — Other Ambulatory Visit: Payer: Self-pay | Admitting: Internal Medicine

## 2021-04-09 ENCOUNTER — Encounter: Payer: Self-pay | Admitting: Internal Medicine

## 2021-04-09 VITALS — BP 108/62 | HR 88 | Temp 98.9°F | Ht 64.0 in | Wt 155.0 lb

## 2021-04-09 DIAGNOSIS — M2550 Pain in unspecified joint: Secondary | ICD-10-CM | POA: Diagnosis not present

## 2021-04-09 DIAGNOSIS — M7918 Myalgia, other site: Secondary | ICD-10-CM | POA: Diagnosis not present

## 2021-04-09 DIAGNOSIS — E1159 Type 2 diabetes mellitus with other circulatory complications: Secondary | ICD-10-CM

## 2021-04-09 NOTE — Progress Notes (Signed)
Acute office Visit     This visit occurred during the SARS-CoV-2 public health emergency.  Safety protocols were in place, including screening questions prior to the visit, additional usage of staff PPE, and extensive cleaning of exam room while observing appropriate contact time as indicated for disinfecting solutions.    CC/Reason for Visit: Multiple achy joints, back pain  HPI: [name] is a 71 y.o. female who is coming in today for the above mentioned reasons.  She has a complex past medical history with coronary artery disease, A. fib, diabetes that has been well controlled and hyperlipidemia.  She comes in today complaining with several months worth of multiple joints mainly affecting hands and upper extremities, but also knees.  She also has back pain that goes from her shoulders all the way down to her lower back.  She has been on Repatha now for some time.  Past Medical/Surgical History: Past Medical History:  Diagnosis Date   Acute on chronic combined systolic and diastolic CHF, NYHA class 3 ([place]) 03/19/2013   AICD (automatic cardioverter/defibrillator) present 01/2013   Biventricular cardiac pacemaker in situ    Allergic rhinitis    Anogenital warts 01/22/2011   Anxiety state, unspecified 05/13/2013   BREAST CANCER, HX OF 09/19/2009   Qualifier: Diagnosis of  By: [name], [name]    CAD (coronary artery disease)    a. 2004: s/p MI in [place]. No PCI->Medical RX;  b. 07/2012 Cath: LM 30-40, LAD 70p, 70/63m, D1 80-90p, OM1 small 90p, OM2 large 80-90p, 23m, 70-80d, RCA 20-30 diff, EF 40%, glob HK.s/p CABG   Cancer of left breast ([place]) 2002   Patient reports left breast cancer diagnosis in 2002 treated with bilateral mastectomy positive lymph nodes with left axillary dissection followed by chemotherapy of unknown type   Cataract    Chronic combined systolic and diastolic CHF, NYHA class 2 ([place]) 01/08/2013   Diabetes mellitus type II    Exertional dyspnea 08/05/2014    Exertional shortness of breath    Generalized osteoarthrosis, involving multiple sites 08/25/2015   Hyperlipidemia    Hypertension    Incisional hernia, without obstruction or gangrene 05/04/2015   Ischemic cardiomyopathy    a. 07/2012 Echo: EF 35%, Sev inferoseptal HK, mildly dil LA, Peak PASP 24mmHg.   LBBB (left bundle branch block)    a. intermittent - present during rapid afib 07/2012.   MYOCARDIAL INFARCTION, HX OF 09/19/2009   Qualifier: Diagnosis of  By: [name], Jory Ee    Neuromuscular disorder [facility])    Patient reports chronic numbness in the right foot related to previous surgery on the right leg and "nerve damage"   PAF (paroxysmal atrial fibrillation) ([place])    a. 07/2012: Amio and xarelto initiated.   Right carotid bruit 08/11/2012   SBO (small bowel obstruction) ([facility])    SOB (shortness of breath) 02/26/2018   Type 2 diabetes mellitus with circulatory disorder, without long-term current use of insulin ([place]) 10/03/2015   Vitamin D deficiency 02/22/2016    Past Surgical History:  Procedure Laterality Date   ABDOMINAL HYSTERECTOMY  2000   APPENDECTOMY  1974   BI-VENTRICULAR PACEMAKER INSERTION N/A 01/25/2013   Procedure: BI-VENTRICULAR PACEMAKER INSERTION (CRT-P);  Surgeon: Evans Lance, MD;  Location: [facility] CATH LAB;  Service: Cardiovascular;  Laterality: N/A;   BREAST BIOPSY Left 2002   CARDIAC CATHETERIZATION     CHOLECYSTECTOMY OPEN  1974   CORONARY ARTERY BYPASS GRAFT N/A 09/28/2012   Procedure: CORONARY  ARTERY BYPASS GRAFTING (CABG);  Surgeon: [name], MD;  Location: [place];  Service: Open Heart Surgery;  Laterality: N/A;  CABG x four, using left internal mammary artery and left leg greater saphenous vein harvested endoscopically   DILATION AND CURETTAGE OF UTERUS  1983   EPICARDIAL PACING LEAD PLACEMENT N/A 09/28/2012   Procedure: EPICARDIAL PACING LEAD PLACEMENT;  Surgeon: [name], MD;  Location: [place];  Service: Thoracic;  Laterality: N/A;  LV LEAD  PLACEMENT   HERNIA REPAIR     INCISIONAL HERNIA REPAIR N/A 05/25/2015   Procedure: LAPAROSCOPIC INCISIONAL HERNIA WITH MESH ;  Surgeon: [name], MD;  Location: [place];  Service: General;  Laterality: N/A;   INCONTINENCE SURGERY  2000   INSERTION OF MESH N/A 05/25/2015   Procedure: INSERTION OF MESH;  Surgeon: [name], MD;  Location: [place];  Service: General;  Laterality: N/A;   INTRAOPERATIVE TRANSESOPHAGEAL ECHOCARDIOGRAM N/A 09/28/2012   Procedure: INTRAOPERATIVE TRANSESOPHAGEAL ECHOCARDIOGRAM;  Surgeon: [name], MD;  Location: [place];  Service: Open Heart Surgery;  Laterality: N/A;   LAPAROSCOPIC INCISIONAL / UMBILICAL / VENTRAL HERNIA REPAIR  05/25/2015   IHR   LEFT HEART CATHETERIZATION WITH CORONARY ANGIOGRAM N/A 07/20/2012   Procedure: LEFT HEART CATHETERIZATION WITH CORONARY ANGIOGRAM;  Surgeon: [name], MD;  Location: [facility] CATH LAB;  Service: Cardiovascular;  Laterality: N/A;   MASTECTOMY Right 2002   MASTECTOMY MODIFIED RADICAL W/ AXILLARY LYMPH NODES W/ OR W/O PECTORALIS MINOR Left 2002   MAZE N/A 09/28/2012   Procedure: MAZE;  Surgeon: [name], MD;  Location: [place];  Service: Open Heart Surgery;  Laterality: N/A;   ORIF WRIST FRACTURE Right 11/08/2017   Procedure: OPEN REDUCTION INTERNAL FIXATION (ORIF) WRIST FRACTURE;  Surgeon: [name], MD;  Location: [place];  Service: Orthopedics;  Laterality: Right;  90 mins   PPM GENERATOR CHANGEOUT N/A 04/17/2020   Procedure: PPM GENERATOR CHANGEOUT;  Surgeon: [name], MD;  Location: [place] CV LAB;  Service: Cardiovascular;  Laterality: N/A;   TENDON REPAIR Right 2001 X 3-4   torn ligaments and tendons in ankle up to knee from work related accident   [place]    Social History:  reports that she quit smoking about 9 years ago. Her smoking use included cigarettes. She has a 10.00 pack-year smoking history. She has never used smokeless tobacco. She reports that she does not  drink alcohol and does not use drugs.  Allergies: Allergies  Allergen Reactions   Statins Other (See Comments)    Myalgias with rosuvastatin, atorvastatin and simvastatin     Family History:  Family History  Problem Relation Age of Onset   Kidney failure Mother    Heart disease Mother        Died mid 25K complications of diabetes   Diabetes Mother    Colon cancer Sister    Arthritis Other    Diabetes Other    Hyperlipidemia Other    Ovarian cancer Other    Liver cancer Brother    Stomach cancer Neg Hx    Rectal cancer Neg Hx    Esophageal cancer Neg Hx    Pancreatic cancer Neg Hx      Current Outpatient Medications:    APPLE CIDER VINEGAR PO, Take 5,220 mg by mouth in the morning, at noon, and at bedtime. 1740 mg each, Disp: , Rfl:    benzonatate (TESSALON) 100 MG capsule, Take 1 capsule (100 mg total) by  mouth 2 (two) times daily as needed for cough., Disp: 20 capsule, Rfl: 0   Blood Glucose Monitoring Suppl (TRUE METRIX AIR GLUCOSE METER) DEVI, Check blood sugar 2 times a day, Disp: 1 each, Rfl: 0   carvedilol (COREG) 12.5 MG tablet, TAKE 1 TABLET (12.5 MG) BY MOUTH 2 (TWO) TIMES DAILY WITH A MEAL., Disp: 180 tablet, Rfl: 3   furosemide (LASIX) 20 MG tablet, TAKE 1 TABLET EVERY DAY, Disp: 90 tablet, Rfl: 3   glipiZIDE (GLUCOTROL) 5 MG tablet, TAKE 1 TABLET TWICE DAILY BEFORE MEALS, Disp: 180 tablet, Rfl: 0   glucose blood (TRUE METRIX BLOOD GLUCOSE TEST) test strip, Use to check blood sugar 2 times a day., Disp: 200 each, Rfl: 12   isosorbide mononitrate (IMDUR) 60 MG 24 hr tablet, TAKE 1 TABLET EVERY DAY, Disp: 90 tablet, Rfl: 3   JARDIANCE 25 MG TABS tablet, TAKE 1 TABLET (25 MG TOTAL) BY MOUTH DAILY BEFORE BREAKFAST., Disp: 90 tablet, Rfl: 1   losartan (COZAAR) 25 MG tablet, TAKE 1 TABLET TWICE DAILY (Patient taking differently: Take 25 mg by mouth 2 (two) times daily.), Disp: 180 tablet, Rfl: 3   losartan (COZAAR) 25 MG tablet, Take by mouth., Disp: , Rfl:    losartan  (COZAAR) 25 MG tablet, TAKE 1 TABLET TWICE DAILY, Disp: 180 tablet, Rfl: 0   metFORMIN (GLUCOPHAGE) 1000 MG tablet, TAKE ONE TABLET BY MOUTH TWICE A DAY WITH FOOD (Patient taking differently: Take 1,000 mg by mouth 2 (two) times daily with a meal.), Disp: 200 tablet, Rfl: 4   metFORMIN (GLUCOPHAGE) 1000 MG tablet, Take by mouth., Disp: , Rfl:    metFORMIN (GLUCOPHAGE) 1000 MG tablet, TAKE ONE TABLET BY MOUTH TWICE A DAY WITH FOOD, Disp: 180 tablet, Rfl: 0   nitroGLYCERIN (NITROSTAT) 0.4 MG SL tablet, Place 1 tablet (0.4 mg total) under the tongue every 5 (five) minutes as needed for chest pain., Disp: 25 tablet, Rfl: 3   REPATHA SURECLICK 656 MG/ML SOAJ, INJECT 140 MG INTO THE SKIN EVERY 14 (FOURTEEN) DAYS., Disp: 6 mL, Rfl: 3   TRUEplus Lancets 33G MISC, Use to check blood sugar 2 times a day., Disp: 200 each, Rfl: 12   TRULICITY 1.5 CL/2.7NT SOPN, INJECT 1.5MG  (1 PEN) SUBCUTANEOUSLY EVERY WEEK, Disp: 3 mL, Rfl: 0   XARELTO 20 MG TABS tablet, TAKE 1 TABLET (20 MG TOTAL) BY MOUTH DAILY WITH SUPPER., Disp: 90 tablet, Rfl: 3  Review of Systems:  Constitutional: Denies fever, chills, diaphoresis, appetite change and fatigue.  HEENT: Denies photophobia, eye pain, redness, hearing loss, ear pain, congestion, sore throat, rhinorrhea, sneezing, mouth sores, trouble swallowing, neck pain, neck stiffness and tinnitus.   Respiratory: Denies SOB, DOE, cough, chest tightness,  and wheezing.   Cardiovascular: Denies chest pain, palpitations and leg swelling.  Gastrointestinal: Denies nausea, vomiting, abdominal pain, diarrhea, constipation, blood in stool and abdominal distention.  Genitourinary: Denies dysuria, urgency, frequency, hematuria, flank pain and difficulty urinating.  Endocrine: Denies: hot or cold intolerance, sweats, changes in hair or nails, polyuria, polydipsia. Musculoskeletal: Denies  and gait problem.  Skin: Denies pallor, rash and wound.  Neurological: Denies dizziness, seizures, syncope,  weakness, light-headedness, numbness and headaches.  Hematological: Denies adenopathy. Easy bruising, personal or family bleeding history  Psychiatric/Behavioral: Denies suicidal ideation, mood changes, confusion, nervousness, sleep disturbance and agitation    Physical Exam: Vitals:   04/09/21 0707  BP: 108/62  Pulse: 88  Temp: 98.9 F (37.2 C)  TempSrc: Oral  SpO2: 97%  Weight: 155  lb (70.3 kg)  Height: 5\' 4"  (1.626 m)    Body mass index is 26.61 kg/m.   Constitutional: NAD, calm, comfortable Eyes: PERRL, lids and conjunctivae normal ENMT: Mucous membranes are moist.  Neurologic: Grossly intact and nonfocal Psychiatric: Normal judgment and insight. Alert and oriented x 3. Normal mood.    Impression and Plan:  Musculoskeletal pain Polyarthralgia  -Suspect musculoskeletal back pain due to poor posture, joint aches likely some low-grade osteoarthritis versus possibly related to her Repatha. -Have discussed things like local massage therapy, warm soaks, back exercises, occasional NSAID use and even physical therapy if needed.  Time spent: 23 minutes reviewing chart, interviewing and examining patient and formulating plan of care.    Lelon Frohlich, MD  Primary Care at [facility]

## 2021-04-09 NOTE — Telephone Encounter (Signed)
Prescription refill request for Xarelto received.  Indication:Afib Last office visit:5/22 Weight:69 kg Age:71 Scr:0.6 CrCl:96.39 ml/min  Prescription refilled

## 2021-04-17 ENCOUNTER — Ambulatory Visit (INDEPENDENT_AMBULATORY_CARE_PROVIDER_SITE_OTHER): Payer: Medicare HMO

## 2021-04-17 DIAGNOSIS — I255 Ischemic cardiomyopathy: Secondary | ICD-10-CM

## 2021-04-17 LAB — CUP PACEART REMOTE DEVICE CHECK
Battery Remaining Longevity: 71 mo
Battery Remaining Percentage: 86 %
Battery Voltage: 2.99 V
Brady Statistic AP VP Percent: 1 %
Brady Statistic AP VS Percent: 1 %
Brady Statistic AS VP Percent: 99 %
Brady Statistic AS VS Percent: 1 %
Brady Statistic RA Percent Paced: 1 %
Date Time Interrogation Session: 20230207020013
Implantable Lead Implant Date: 20140721
Implantable Lead Implant Date: 20141117
Implantable Lead Implant Date: 20141117
Implantable Lead Location: 753858
Implantable Lead Location: 753859
Implantable Lead Location: 753860
Implantable Lead Model: 5071
Implantable Pulse Generator Implant Date: 20220207
Lead Channel Impedance Value: 400 Ohm
Lead Channel Impedance Value: 400 Ohm
Lead Channel Impedance Value: 600 Ohm
Lead Channel Pacing Threshold Amplitude: 0.5 V
Lead Channel Pacing Threshold Amplitude: 0.75 V
Lead Channel Pacing Threshold Amplitude: 1.5 V
Lead Channel Pacing Threshold Pulse Width: 0.4 ms
Lead Channel Pacing Threshold Pulse Width: 0.4 ms
Lead Channel Pacing Threshold Pulse Width: 0.5 ms
Lead Channel Sensing Intrinsic Amplitude: 0.6 mV
Lead Channel Sensing Intrinsic Amplitude: 12 mV
Lead Channel Setting Pacing Amplitude: 2 V
Lead Channel Setting Pacing Amplitude: 2.5 V
Lead Channel Setting Pacing Amplitude: 2.5 V
Lead Channel Setting Pacing Pulse Width: 0.4 ms
Lead Channel Setting Pacing Pulse Width: 0.5 ms
Lead Channel Setting Sensing Sensitivity: 2 mV
Pulse Gen Model: 3222
Pulse Gen Serial Number: 3859407

## 2021-04-20 NOTE — Progress Notes (Signed)
Remote pacemaker transmission.   

## 2021-04-25 ENCOUNTER — Other Ambulatory Visit: Payer: Self-pay | Admitting: Cardiovascular Disease

## 2021-04-30 ENCOUNTER — Encounter: Payer: Self-pay | Admitting: Internal Medicine

## 2021-05-01 NOTE — Telephone Encounter (Signed)
Good morning [name],  Is the hernia Inguinal or abdominal? Does you want to see a surgeon?   thanks

## 2021-05-09 ENCOUNTER — Ambulatory Visit (INDEPENDENT_AMBULATORY_CARE_PROVIDER_SITE_OTHER): Payer: Medicare HMO | Admitting: Internal Medicine

## 2021-05-09 VITALS — BP 110/80 | HR 94 | Temp 98.4°F | Wt 155.2 lb

## 2021-05-09 DIAGNOSIS — Z780 Asymptomatic menopausal state: Secondary | ICD-10-CM

## 2021-05-09 DIAGNOSIS — Z13820 Encounter for screening for osteoporosis: Secondary | ICD-10-CM | POA: Diagnosis not present

## 2021-05-09 DIAGNOSIS — K432 Incisional hernia without obstruction or gangrene: Secondary | ICD-10-CM | POA: Diagnosis not present

## 2021-05-09 DIAGNOSIS — M25512 Pain in left shoulder: Secondary | ICD-10-CM | POA: Diagnosis not present

## 2021-05-09 DIAGNOSIS — G8929 Other chronic pain: Secondary | ICD-10-CM

## 2021-05-09 MED ORDER — MONTELUKAST SODIUM 10 MG PO TABS: 10.0000 mg | ORAL_TABLET | Freq: Every day | ORAL | 3 refills | Status: DC

## 2021-05-09 NOTE — Progress Notes (Signed)
Established Patient Office Visit     This visit occurred during the SARS-CoV-2 public health emergency.  Safety protocols were in place, including screening questions prior to the visit, additional usage of staff PPE, and extensive cleaning of exam room while observing appropriate contact time as indicated for disinfecting solutions.    CC/Reason for Visit: Discuss some acute concerns  HPI: [name] is a 71 y.o. female who is coming in today for the above mentioned reasons.  She is here today to discuss 2 acute concerns:  1.  She had a previous repair of a hernia by Dr. Ruby Cola Heniford in [place].  She has developed an incisional hernia resulting from that.  She is having pain at times.  2.  She has been having chronic left shoulder pain with limitation to range of motion.  She is unable to comb her hair or reach into kitchen cabinets.  Past Medical/Surgical History: Past Medical History:  Diagnosis Date   Acute on chronic combined systolic and diastolic CHF, NYHA class 3 ([place]) 03/19/2013   AICD (automatic cardioverter/defibrillator) present 01/2013   Biventricular cardiac pacemaker in situ    Allergic rhinitis    Anogenital warts 01/22/2011   Anxiety state, unspecified 05/13/2013   BREAST CANCER, HX OF 09/19/2009   Qualifier: Diagnosis of  By: [name], [name]    CAD (coronary artery disease)    a. 2004: s/p MI in [place]. No PCI->Medical RX;  b. 07/2012 Cath: LM 30-40, LAD 70p, 70/42m, D1 80-90p, OM1 small 90p, OM2 large 80-90p, 49m, 70-80d, RCA 20-30 diff, EF 40%, glob HK.s/p CABG   Cancer of left breast ([place]) 2002   Patient reports left breast cancer diagnosis in 2002 treated with bilateral mastectomy positive lymph nodes with left axillary dissection followed by chemotherapy of unknown type   Cataract    Chronic combined systolic and diastolic CHF, NYHA class 2 ([place]) 01/08/2013   Diabetes mellitus type II    Exertional dyspnea 08/05/2014   Exertional shortness of  breath    Generalized osteoarthrosis, involving multiple sites 08/25/2015   Hyperlipidemia    Hypertension    Incisional hernia, without obstruction or gangrene 05/04/2015   Ischemic cardiomyopathy    a. 07/2012 Echo: EF 35%, Sev inferoseptal HK, mildly dil LA, Peak PASP 49mmHg.   LBBB (left bundle branch block)    a. intermittent - present during rapid afib 07/2012.   MYOCARDIAL INFARCTION, HX OF 09/19/2009   Qualifier: Diagnosis of  By: [name], Jory Ee    Neuromuscular disorder [facility])    Patient reports chronic numbness in the right foot related to previous surgery on the right leg and "nerve damage"   PAF (paroxysmal atrial fibrillation) ([place])    a. 07/2012: Amio and xarelto initiated.   Right carotid bruit 08/11/2012   SBO (small bowel obstruction) ([facility])    SOB (shortness of breath) 02/26/2018   Type 2 diabetes mellitus with circulatory disorder, without long-term current use of insulin ([place]) 10/03/2015   Vitamin D deficiency 02/22/2016    Past Surgical History:  Procedure Laterality Date   ABDOMINAL HYSTERECTOMY  2000   APPENDECTOMY  1974   BI-VENTRICULAR PACEMAKER INSERTION N/A 01/25/2013   Procedure: BI-VENTRICULAR PACEMAKER INSERTION (CRT-P);  Surgeon: Evans Lance, MD;  Location: [facility] CATH LAB;  Service: Cardiovascular;  Laterality: N/A;   BREAST BIOPSY Left 2002   CARDIAC CATHETERIZATION     CHOLECYSTECTOMY OPEN  1974   CORONARY ARTERY BYPASS GRAFT N/A 09/28/2012   Procedure:  CORONARY ARTERY BYPASS GRAFTING (CABG);  Surgeon: [name], MD;  Location: [place];  Service: Open Heart Surgery;  Laterality: N/A;  CABG x four, using left internal mammary artery and left leg greater saphenous vein harvested endoscopically   DILATION AND CURETTAGE OF UTERUS  1983   EPICARDIAL PACING LEAD PLACEMENT N/A 09/28/2012   Procedure: EPICARDIAL PACING LEAD PLACEMENT;  Surgeon: [name], MD;  Location: [place];  Service: Thoracic;  Laterality: N/A;  LV LEAD PLACEMENT   HERNIA REPAIR      INCISIONAL HERNIA REPAIR N/A 05/25/2015   Procedure: LAPAROSCOPIC INCISIONAL HERNIA WITH MESH ;  Surgeon: [name], MD;  Location: [place];  Service: General;  Laterality: N/A;   INCONTINENCE SURGERY  2000   INSERTION OF MESH N/A 05/25/2015   Procedure: INSERTION OF MESH;  Surgeon: [name], MD;  Location: [place];  Service: General;  Laterality: N/A;   INTRAOPERATIVE TRANSESOPHAGEAL ECHOCARDIOGRAM N/A 09/28/2012   Procedure: INTRAOPERATIVE TRANSESOPHAGEAL ECHOCARDIOGRAM;  Surgeon: [name], MD;  Location: [place];  Service: Open Heart Surgery;  Laterality: N/A;   LAPAROSCOPIC INCISIONAL / UMBILICAL / VENTRAL HERNIA REPAIR  05/25/2015   IHR   LEFT HEART CATHETERIZATION WITH CORONARY ANGIOGRAM N/A 07/20/2012   Procedure: LEFT HEART CATHETERIZATION WITH CORONARY ANGIOGRAM;  Surgeon: [name], MD;  Location: [facility] CATH LAB;  Service: Cardiovascular;  Laterality: N/A;   MASTECTOMY Right 2002   MASTECTOMY MODIFIED RADICAL W/ AXILLARY LYMPH NODES W/ OR W/O PECTORALIS MINOR Left 2002   MAZE N/A 09/28/2012   Procedure: MAZE;  Surgeon: [name], MD;  Location: [place];  Service: Open Heart Surgery;  Laterality: N/A;   ORIF WRIST FRACTURE Right 11/08/2017   Procedure: OPEN REDUCTION INTERNAL FIXATION (ORIF) WRIST FRACTURE;  Surgeon: [name], MD;  Location: [place];  Service: Orthopedics;  Laterality: Right;  90 mins   PPM GENERATOR CHANGEOUT N/A 04/17/2020   Procedure: PPM GENERATOR CHANGEOUT;  Surgeon: [name], MD;  Location: [place] CV LAB;  Service: Cardiovascular;  Laterality: N/A;   TENDON REPAIR Right 2001 X 3-4   torn ligaments and tendons in ankle up to knee from work related accident   [place]    Social History:  reports that she quit smoking about 9 years ago. Her smoking use included cigarettes. She has a 10.00 pack-year smoking history. She has never used smokeless tobacco. She reports that she does not drink alcohol and does not use  drugs.  Allergies: Allergies  Allergen Reactions   Statins Other (See Comments)    Myalgias with rosuvastatin, atorvastatin and simvastatin     Family History:  Family History  Problem Relation Age of Onset   Kidney failure Mother    Heart disease Mother        Died mid 34H complications of diabetes   Diabetes Mother    Colon cancer Sister    Arthritis Other    Diabetes Other    Hyperlipidemia Other    Ovarian cancer Other    Liver cancer Brother    Stomach cancer Neg Hx    Rectal cancer Neg Hx    Esophageal cancer Neg Hx    Pancreatic cancer Neg Hx      Current Outpatient Medications:    APPLE CIDER VINEGAR PO, Take 5,220 mg by mouth in the morning, at noon, and at bedtime. 1740 mg each, Disp: , Rfl:    Blood Glucose Monitoring Suppl (TRUE METRIX AIR GLUCOSE METER) DEVI, Check  blood sugar 2 times a day, Disp: 1 each, Rfl: 0   carvedilol (COREG) 12.5 MG tablet, TAKE 1 TABLET (12.5 MG) BY MOUTH 2 (TWO) TIMES DAILY WITH A MEAL., Disp: 180 tablet, Rfl: 3   Dulaglutide (TRULICITY) 1.5 OE/7.0JJ SOPN, INJECT 1.5MG  (1 PEN) UNDER THE SKIN EVERY WEEK, Disp: 6 mL, Rfl: 0   furosemide (LASIX) 20 MG tablet, TAKE 1 TABLET EVERY DAY, Disp: 90 tablet, Rfl: 3   glipiZIDE (GLUCOTROL) 5 MG tablet, TAKE 1 TABLET TWICE DAILY BEFORE MEALS, Disp: 180 tablet, Rfl: 0   glucose blood (TRUE METRIX BLOOD GLUCOSE TEST) test strip, Use to check blood sugar 2 times a day., Disp: 200 each, Rfl: 12   isosorbide mononitrate (IMDUR) 60 MG 24 hr tablet, TAKE 1 TABLET EVERY DAY, Disp: 90 tablet, Rfl: 3   JARDIANCE 25 MG TABS tablet, TAKE 1 TABLET (25 MG TOTAL) BY MOUTH DAILY BEFORE BREAKFAST., Disp: 90 tablet, Rfl: 1   losartan (COZAAR) 25 MG tablet, TAKE 1 TABLET TWICE DAILY (Patient taking differently: Take 25 mg by mouth 2 (two) times daily.), Disp: 180 tablet, Rfl: 3   losartan (COZAAR) 25 MG tablet, Take by mouth., Disp: , Rfl:    losartan (COZAAR) 25 MG tablet, TAKE 1 TABLET TWICE DAILY, Disp: 180 tablet,  Rfl: 0   losartan (COZAAR) 25 MG tablet, TAKE 1 TABLET TWICE DAILY, Disp: 180 tablet, Rfl: 0   metFORMIN (GLUCOPHAGE) 1000 MG tablet, TAKE ONE TABLET BY MOUTH TWICE A DAY WITH FOOD (Patient taking differently: Take 1,000 mg by mouth 2 (two) times daily with a meal.), Disp: 200 tablet, Rfl: 4   metFORMIN (GLUCOPHAGE) 1000 MG tablet, Take by mouth., Disp: , Rfl:    metFORMIN (GLUCOPHAGE) 1000 MG tablet, TAKE ONE TABLET BY MOUTH TWICE A DAY WITH FOOD, Disp: 180 tablet, Rfl: 0   montelukast (SINGULAIR) 10 MG tablet, Take 1 tablet (10 mg total) by mouth at bedtime., Disp: 90 tablet, Rfl: 3   nitroGLYCERIN (NITROSTAT) 0.4 MG SL tablet, Place 1 tablet (0.4 mg total) under the tongue every 5 (five) minutes as needed for chest pain., Disp: 25 tablet, Rfl: 3   REPATHA SURECLICK 009 MG/ML SOAJ, INJECT 140 MG INTO THE SKIN EVERY 14 (FOURTEEN) DAYS., Disp: 6 mL, Rfl: 3   TRUEplus Lancets 33G MISC, Use to check blood sugar 2 times a day., Disp: 200 each, Rfl: 12   XARELTO 20 MG TABS tablet, TAKE 1 TABLET (20 MG TOTAL) BY MOUTH DAILY WITH SUPPER., Disp: 90 tablet, Rfl: 3  Review of Systems:  Constitutional: Denies fever, chills, diaphoresis, appetite change and fatigue.  HEENT: Denies photophobia, eye pain, redness, hearing loss, ear pain, congestion, sore throat, rhinorrhea, sneezing, mouth sores, trouble swallowing, neck pain, neck stiffness and tinnitus.   Respiratory: Denies SOB, DOE, cough, chest tightness,  and wheezing.   Cardiovascular: Denies chest pain, palpitations and leg swelling.  Gastrointestinal: Denies nausea, vomiting, abdominal pain, diarrhea, constipation, blood in stool and abdominal distention.  Genitourinary: Denies dysuria, urgency, frequency, hematuria, flank pain and difficulty urinating.  Endocrine: Denies: hot or cold intolerance, sweats, changes in hair or nails, polyuria, polydipsia. Musculoskeletal: Denies myalgias, back pain, joint swelling  and gait problem.  Skin: Denies  pallor, rash and wound.  Neurological: Denies dizziness, seizures, syncope, weakness, light-headedness, numbness and headaches.  Hematological: Denies adenopathy. Easy bruising, personal or family bleeding history  Psychiatric/Behavioral: Denies suicidal ideation, mood changes, confusion, nervousness, sleep disturbance and agitation    Physical Exam: Vitals:   05/09/21 3818  BP: 110/80  Pulse: 94  Temp: 98.4 F (36.9 C)  TempSrc: Oral  SpO2: 99%  Weight: 155 lb 3.2 oz (70.4 kg)    Body mass index is 26.64 kg/m.   Constitutional: NAD, calm, comfortable Eyes: PERRL, lids and conjunctivae normal ENMT: Mucous membranes are moist.   Musculoskeletal: Significant limitation to range of motion of her left shoulder, unable to raise in frontal or lateral beyond 90 degrees. Abdomen: She does have an incisional hernia. Psychiatric: Normal judgment and insight. Alert and oriented x 3. Normal mood.    Impression and Plan:  Encounter for osteoporosis screening in asymptomatic postmenopausal patient  - Plan: DG Bone Density  Incisional hernia, without obstruction or gangrene  - Plan: Ambulatory referral to General Surgery. -Have advised her to discuss with the surgeon who did her previous hernia repair in [place].  She agrees.  Chronic left shoulder pain  - Plan: Ambulatory referral to Orthopedic Surgery, suspect either adhesive capsulitis or rotator cuff tendinopathy given severe limitation to range of motion with pain.  Time spent: 31 minutes reviewing chart, interviewing and examining patient and formulating plan of care.     Lelon Frohlich, MD  Primary Care at [facility]

## 2021-05-21 ENCOUNTER — Encounter: Payer: Self-pay | Admitting: Orthopedic Surgery

## 2021-05-21 ENCOUNTER — Ambulatory Visit (INDEPENDENT_AMBULATORY_CARE_PROVIDER_SITE_OTHER): Payer: Medicare HMO

## 2021-05-21 ENCOUNTER — Other Ambulatory Visit: Payer: Self-pay

## 2021-05-21 ENCOUNTER — Ambulatory Visit: Payer: Medicare HMO | Admitting: Orthopedic Surgery

## 2021-05-21 DIAGNOSIS — G8929 Other chronic pain: Secondary | ICD-10-CM

## 2021-05-21 DIAGNOSIS — M25512 Pain in left shoulder: Secondary | ICD-10-CM

## 2021-05-21 DIAGNOSIS — M7502 Adhesive capsulitis of left shoulder: Secondary | ICD-10-CM

## 2021-05-21 NOTE — Progress Notes (Signed)
Office Visit Note   Patient: [name]           Date of Birth: Apr 15, 1950           MRN: [id number] Visit Date: 05/21/2021              Requested by: Philip Aspen, Limmie Patricia, MD [address] [place],  [place] [address] PCP: Philip Aspen, Limmie Patricia, MD  Chief Complaint  Patient presents with   Left Shoulder - Pain      HPI: Patient is a 71 year old woman who is seen for initial evaluation for decreased range of motion left shoulder.  Patient states she has had bilateral mastectomies has had open heart surgery and electrical device implanted.  Patient states she cannot perform activities of daily living due to the adhesive capsulitis of the left shoulder.  Assessment & Plan: Visit Diagnoses:  1. Adhesive capsulitis of left shoulder   2. Chronic left shoulder pain     Plan: Patient is set up for physical therapy to work on range of motion of the left shoulder.  Discussed that if she does not obtain sufficient range of motion with therapy arthroscopic intervention is an option.  Follow-Up Instructions: Return in about 2 months (around 07/21/2021).   Ortho Exam  Patient is alert, oriented, no adenopathy, well-dressed, normal affect, normal respiratory effort. Examination patient has abduction and flexion of 70 degrees she has internal and external rotation of 45 degrees she has pain with Neer and Hawkins impingement test.  The AC joint is nontender to palpation.  Imaging: XR Shoulder Left  Result Date: 05/21/2021 2 view radiographs of the left shoulder shows superior migration of the humeral head within the glenoid with no fractures.  Patient has hardware from sternal sternotomy and a pacemaker.  No images are attached to the encounter.  Labs: Lab Results  Component Value Date   HGBA1C 6.7 (A) 02/08/2021   HGBA1C 7.0 06/29/2020   HGBA1C 7.1 (A) 12/28/2019   ESRSEDRATE 6 08/11/2015   CRP <0.5 08/11/2015   REPTSTATUS 07/21/2012 FINAL 07/19/2012    GRAMSTAIN Rare 11/24/2012   GRAMSTAIN WBC present-both PMN and Mononuclear 11/24/2012   GRAMSTAIN No Squamous Epithelial Cells Seen 11/24/2012   GRAMSTAIN No Organisms Seen 11/24/2012   CULT  07/19/2012    GROUP B STREP(S.AGALACTIAE)ISOLATED Note: TESTING AGAINST S. AGALACTIAE NOT ROUTINELY PERFORMED DUE TO PREDICTABILITY OF AMP/PEN/VAN SUSCEPTIBILITY. ESCHERICHIA COLI   LABORGA STAPHYLOCOCCUS AUREUS 11/24/2012     Lab Results  Component Value Date   ALBUMIN 3.8 11/01/2020   ALBUMIN 4.1 04/10/2020   ALBUMIN 4.3 04/19/2019    Lab Results  Component Value Date   MG 1.9 09/29/2012   MG 1.9 09/29/2012   MG 2.6 (H) 09/28/2012   Lab Results  Component Value Date   VD25OH 26.5 (L) 12/08/2018   VD25OH 26.08 (L) 10/04/2016   VD25OH 38.79 02/22/2016    No results found for: PREALBUMIN CBC EXTENDED Latest Ref Rng & Units 11/01/2020 04/10/2020 09/17/2019  WBC 4.0 - 10.5 K/uL 6.6 7.7 6.9  RBC 3.87 - 5.11 MIL/uL 4.18 4.55 4.17  HGB 12.0 - 15.0 g/dL 62.1 30.8 65.7  HCT 84.6 - 46.0 % 39.1 43.7 39.3  PLT 150 - 400 K/uL 217 252 241  NEUTROABS 1.7 - 7.7 K/uL 3.7 - -  LYMPHSABS 0.7 - 4.0 K/uL 2.0 - -     There is no height or weight on file to calculate BMI.  Orders:  Orders Placed This Encounter  Procedures  XR Shoulder Left   Ambulatory referral to Physical Therapy   No orders of the defined types were placed in this encounter.    Procedures: No procedures performed  Clinical Data: No additional findings.  ROS:  All other systems negative, except as noted in the HPI. Review of Systems  Objective: Vital Signs: There were no vitals taken for this visit.  Specialty Comments:  No specialty comments available.  PMFS History: Patient Active Problem List   Diagnosis Date Noted   Pacemaker battery depletion 04/17/2020   SOB (shortness of breath) 02/26/2018   Vitamin D deficiency 02/22/2016   Long term current use of anticoagulant 11/10/2015   Type 2 diabetes mellitus  with circulatory disorder, without long-term current use of insulin (HCC) 10/03/2015   Chronic pain disorder 10/03/2015   Generalized osteoarthrosis, involving multiple sites 08/25/2015   Other fatigue 08/11/2015   Back pain, chronic 08/11/2015   Incisional hernia 05/25/2015   Incisional hernia, without obstruction or gangrene 05/04/2015   Exertional dyspnea 08/05/2014   Routine general medical examination at a health care facility 04/14/2014   Anxiety state, unspecified 05/13/2013   Acute on chronic combined systolic and diastolic CHF, NYHA class 3 (HCC) 03/19/2013   High anion gap metabolic acidosis 03/14/2013   Biventricular cardiac pacemaker - St Jude, Nov 2014 03/04/2013   Chronic combined systolic and diastolic CHF, NYHA class 2 (HCC) 01/08/2013   S/P CABG x 4: 09/28/12 (LIMA-LAD, SVG-OM1-OM2, SVG-Intermediate) 09/29/2012   Right carotid bruit 08/11/2012   PAF (paroxysmal atrial fibrillation) (HCC) 07/21/2012   Ischemic cardiomyopathy    CAD (coronary artery disease)    Anogenital warts 01/22/2011   Hyperlipidemia 09/19/2009   Essential hypertension 09/19/2009   MYOCARDIAL INFARCTION, HX OF 09/19/2009   Allergic rhinitis 09/19/2009   BREAST CANCER, HX OF 09/19/2009   Past Medical History:  Diagnosis Date   Acute on chronic combined systolic and diastolic CHF, NYHA class 3 (HCC) 03/19/2013   AICD (automatic cardioverter/defibrillator) present 01/2013   Biventricular cardiac pacemaker in situ    Allergic rhinitis    Anogenital warts 01/22/2011   Anxiety state, unspecified 05/13/2013   BREAST CANCER, HX OF 09/19/2009   Qualifier: Diagnosis of  By: Tawanna Cooler MD, Tinnie Gens A    CAD (coronary artery disease)    a. 2004: s/p MI in [place]. No PCI->Medical RX;  b. 07/2012 Cath: LM 30-40, LAD 70p, 70/2m, D1 80-90p, OM1 small 90p, OM2 large 80-90p, 21m, 70-80d, RCA 20-30 diff, EF 40%, glob HK.s/p CABG   Cancer of left breast (HCC) 2002   Patient reports left breast cancer diagnosis in 2002  treated with bilateral mastectomy positive lymph nodes with left axillary dissection followed by chemotherapy of unknown type   Cataract    Chronic combined systolic and diastolic CHF, NYHA class 2 (HCC) 01/08/2013   Diabetes mellitus type II    Exertional dyspnea 08/05/2014   Exertional shortness of breath    Generalized osteoarthrosis, involving multiple sites 08/25/2015   Hyperlipidemia    Hypertension    Incisional hernia, without obstruction or gangrene 05/04/2015   Ischemic cardiomyopathy    a. 07/2012 Echo: EF 35%, Sev inferoseptal HK, mildly dil LA, Peak PASP .   LBBB (left bundle branch block)    a. intermittent - present during rapid afib 07/2012.   MYOCARDIAL INFARCTION, HX OF 09/19/2009   Qualifier: Diagnosis of  By: Tawanna Cooler MD, Eugenio Hoes    Neuromuscular disorder [facility])    Patient reports chronic numbness in the right foot related to  previous surgery on the right leg and "nerve damage"   PAF (paroxysmal atrial fibrillation) (HCC)    a. 07/2012: Amio and xarelto initiated.   Right carotid bruit 08/11/2012   SBO (small bowel obstruction) (HCC)    SOB (shortness of breath) 02/26/2018   Type 2 diabetes mellitus with circulatory disorder, without long-term current use of insulin (HCC) 10/03/2015   Vitamin D deficiency 02/22/2016    Family History  Problem Relation Age of Onset   Kidney failure Mother    Heart disease Mother        Died mid 90s complications of diabetes   Diabetes Mother    Colon cancer Sister    Arthritis Other    Diabetes Other    Hyperlipidemia Other    Ovarian cancer Other    Liver cancer Brother    Stomach cancer Neg Hx    Rectal cancer Neg Hx    Esophageal cancer Neg Hx    Pancreatic cancer Neg Hx     Past Surgical History:  Procedure Laterality Date   ABDOMINAL HYSTERECTOMY  2000   APPENDECTOMY  1974   BI-VENTRICULAR PACEMAKER INSERTION N/A 01/25/2013   Procedure: BI-VENTRICULAR PACEMAKER INSERTION (CRT-P);  Surgeon: Marinus Maw, MD;   Location: [facility] CATH LAB;  Service: Cardiovascular;  Laterality: N/A;   BREAST BIOPSY Left 2002   CARDIAC CATHETERIZATION     CHOLECYSTECTOMY OPEN  1974   CORONARY ARTERY BYPASS GRAFT N/A 09/28/2012   Procedure: CORONARY ARTERY BYPASS GRAFTING (CABG);  Surgeon: Delight Ovens, MD;  Location: [facility] OR;  Service: Open Heart Surgery;  Laterality: N/A;  CABG x four, using left internal mammary artery and left leg greater saphenous vein harvested endoscopically   DILATION AND CURETTAGE OF UTERUS  1983   EPICARDIAL PACING LEAD PLACEMENT N/A 09/28/2012   Procedure: EPICARDIAL PACING LEAD PLACEMENT;  Surgeon: Delight Ovens, MD;  Location: [facility] OR;  Service: Thoracic;  Laterality: N/A;  LV LEAD PLACEMENT   HERNIA REPAIR     INCISIONAL HERNIA REPAIR N/A 05/25/2015   Procedure: LAPAROSCOPIC INCISIONAL HERNIA WITH MESH ;  Surgeon: Emelia Loron, MD;  Location: [facility] OR;  Service: General;  Laterality: N/A;   INCONTINENCE SURGERY  2000   INSERTION OF MESH N/A 05/25/2015   Procedure: INSERTION OF MESH;  Surgeon: Emelia Loron, MD;  Location: [facility] OR;  Service: General;  Laterality: N/A;   INTRAOPERATIVE TRANSESOPHAGEAL ECHOCARDIOGRAM N/A 09/28/2012   Procedure: INTRAOPERATIVE TRANSESOPHAGEAL ECHOCARDIOGRAM;  Surgeon: Delight Ovens, MD;  Location[facility] OR;  Service: Open Heart Surgery;  Laterality: N/A;   LAPAROSCOPIC INCISIONAL / UMBILICAL / VENTRAL HERNIA REPAIR  05/25/2015   IHR   LEFT HEART CATHETERIZATION WITH CORONARY ANGIOGRAM N/A 07/20/2012   Procedure: LEFT HEART CATHETERIZATION WITH CORONARY ANGIOGRAM;  Surgeon: Peter M Swaziland, MD;  Location: [facility] CATH LAB;  Service: Cardiovascular;  Laterality: N/A;   MASTECTOMY Right 2002   MASTECTOMY MODIFIED RADICAL W/ AXILLARY LYMPH NODES W/ OR W/O PECTORALIS MINOR Left 2002   MAZE N/A 09/28/2012   Procedure: MAZE;  Surgeon: Delight Ovens, MD;  Location: [facility] OR;  Service: Open Heart Surgery;  Laterality: N/A;   ORIF WRIST FRACTURE Right 11/08/2017   Procedure:  OPEN REDUCTION INTERNAL FIXATION (ORIF) WRIST FRACTURE;  Surgeon: Dominica Severin, MD;  Location: [facility] OR;  Service: Orthopedics;  Laterality: Right;  90 mins   PPM GENERATOR CHANGEOUT N/A 04/17/2020   Procedure: PPM GENERATOR CHANGEOUT;  Surgeon: Thurmon Fair, MD;  Location: [facility] INVASIVE CV LAB;  Service: Cardiovascular;  Laterality: N/A;   TENDON REPAIR Right 2001 X 3-4   torn ligaments and tendons in ankle up to knee from work related accident   TUBAL LIGATION  1987   Social History   Occupational History   Occupation: Paramedic work    Associate Professor: [employer]  Tobacco Use   Smoking status: Former    Packs/day: 1.00    Years: 10.00    Pack years: 10.00    Types: Cigarettes    Quit date: 03/11/2012    Years since quitting: 9.2   Smokeless tobacco: Never  Vaping Use   Vaping Use: Never used  Substance and Sexual Activity   Alcohol use: No   Drug use: No   Sexual activity: Yes    Partners: Male    Birth control/protection: Surgical

## 2021-05-24 ENCOUNTER — Encounter: Payer: Self-pay | Admitting: Rehabilitative and Restorative Service Providers"

## 2021-05-24 ENCOUNTER — Ambulatory Visit: Payer: Medicare HMO | Admitting: Rehabilitative and Restorative Service Providers"

## 2021-05-24 ENCOUNTER — Other Ambulatory Visit: Payer: Self-pay

## 2021-05-24 DIAGNOSIS — M25511 Pain in right shoulder: Secondary | ICD-10-CM | POA: Diagnosis not present

## 2021-05-24 DIAGNOSIS — M25612 Stiffness of left shoulder, not elsewhere classified: Secondary | ICD-10-CM | POA: Diagnosis not present

## 2021-05-24 DIAGNOSIS — M6281 Muscle weakness (generalized): Secondary | ICD-10-CM | POA: Diagnosis not present

## 2021-05-24 NOTE — Therapy (Addendum)
?OUTPATIENT PHYSICAL THERAPY SHOULDER EVALUATION ? ? ?Patient Name: [name] ?MRN: [id number] ?DOB:03/15/1950, 71 y.o., female ?Today's Date: 05/24/2021 ? ? PT End of Session - 05/24/21 1150   ? ? Visit Number 1   ? Number of Visits 12   ? Date for PT Re-Evaluation 07/05/21   ? Authorization Type [employer]   ? Authorization - Number of Visits 12   ? Progress Note Due on Visit 10   ? PT Start Time 1100   ? PT Stop Time 1145   ? PT Time Calculation (min) 45 min   ? Activity Tolerance Patient tolerated treatment well   ? Behavior During Therapy [facility] for tasks assessed/performed   ? ?  ?  ? ?  ? ?Referring diagnosis? M25.512,G89.29 (ICD-10-CM) - Chronic left shoulder pain M75.02 (ICD-10-CM) - Adhesive capsulitis of left shoulder  ?Treatment diagnosis? (if different than referring diagnosis) M25.612   M25.511   M62.81 ?What was this (referring dx) caused by? ?'[]'$  Surgery ?'[]'$  Fall ?'[x]'$  Ongoing issue ?'[]'$  Arthritis ?'[]'$  Other: ____________ ? ?Laterality: ?'[]'$  Rt ?'[x]'$  Lt ?'[]'$  Both ? ?Check all possible CPT codes:  *CHOOSE 10 OR LESS*    ?'[x]'$  97110 (Therapeutic Exercise)  '[]'$  92507 (SLP Treatment)  ?'[x]'$  [id number] (Neuro Re-ed)   '[]'$  92526 (Swallowing Treatment)  ? '[]'$  [id number] (Gait Training)   '[]'$  [id number] (Cognitive Training, 1st 15 minutes) ?'[x]'$  97140 (Manual Therapy)   '[]'$  97130 (Cognitive Training, each add'l 15 minutes)  ?'[x]'$  97530 (Therapeutic Activities)  '[]'$  Other, List CPT Code ____________    ?'[x]'$  23762 (Self Care)      ? '[]'$  All codes above (97110 - 97535) ? '[]'$  [id number] (Mechanical Traction) ? '[]'$  83151 (E-stim Unattended) ? '[]'$  97032 (E-stim manual) ? '[]'$  97033 (Ionto) ? '[]'$  97035 (Ultrasound) ? '[]'$  [phone] Therapist, art) ?'[x]'$  73710 (Physical Performance Training) ?'[]'$  62694 (Aquatic Therapy) ?'[]'$  85462 (Contrast Bath) ?'[]'$  [id number] (Paraffin) ?'[]'$  97597 (Wound Care 1st 20 sq cm) ?'[]'$  97598 (Wound Care each add'l 20 sq cm) ?'[x]'$  70350 (Vasopneumatic Device) ?'[]'$  [phone] Comptroller) ?'[]'$  [id number] (Prosthetic Training)  ?Past Medical History:   ?Diagnosis Date  ? Acute on chronic combined systolic and diastolic CHF, NYHA class 3 ([place]) 03/19/2013  ? AICD (automatic cardioverter/defibrillator) present 01/2013  ? Biventricular cardiac pacemaker in situ   ? Allergic rhinitis   ? Anogenital warts 01/22/2011  ? Anxiety state, unspecified 05/13/2013  ? BREAST CANCER, HX OF 09/19/2009  ? Qualifier: Diagnosis of  By: [name], [name]   ? CAD (coronary artery disease)   ? a. 2004: s/p MI in [place]. No PCI->Medical RX;  b. 07/2012 Cath: LM 30-40, LAD 70p, 70/103m D1 80-90p, OM1 small 90p, OM2 large 80-90p, 55m70-80d, RCA 20-30 diff, EF 40%, glob HK.s/p CABG  ? [facility]  ? Patient reports left breast cancer diagnosis in 2002 treated with bilateral mastectomy positive lymph nodes with left axillary dissection followed by chemotherapy of unknown type  ? Cataract   ? Chronic combined systolic and diastolic CHF, NYHA class 2 ([place]10/31/2014  ? Diabetes mellitus type II   ? Exertional dyspnea 08/05/2014  ? Exertional shortness of breath   ? Generalized osteoarthrosis, involving multiple sites 08/25/2015  ? Hyperlipidemia   ? Hypertension   ? Incisional hernia, without obstruction or gangrene 05/04/2015  ? Ischemic cardiomyopathy   ? a. 07/2012 Echo: EF 35%, Sev inferoseptal HK, mildly dil LA, Peak PASP 5977m.  ? LBBB (left bundle branch block)   ?  a. intermittent - present during rapid afib 07/2012.  ? MYOCARDIAL INFARCTION, HX OF 09/19/2009  ? Qualifier: Diagnosis of  By: [name], Jory Ee   ? Neuromuscular disorder ([place])   ? Patient reports chronic numbness in the right foot related to previous surgery on the right leg and "nerve damage"  ? PAF (paroxysmal atrial fibrillation) ([place])   ? a. 07/2012: Amio and xarelto initiated.  ? Right carotid bruit 08/11/2012  ? SBO (small bowel obstruction) ([place])   ? SOB (shortness of breath) 02/26/2018  ? Type 2 diabetes mellitus with circulatory disorder, without long-term current use of insulin ([place]) 10/03/2015  ?  Vitamin D deficiency 02/22/2016  ? ?Past Surgical History:  ?Procedure Laterality Date  ? ABDOMINAL HYSTERECTOMY  2000  ? APPENDECTOMY  1974  ? BI-VENTRICULAR PACEMAKER INSERTION N/A 01/25/2013  ? Procedure: BI-VENTRICULAR PACEMAKER INSERTION (CRT-P);  Surgeon: Evans Lance, MD;  Location: [facility] CATH LAB;  Service: Cardiovascular;  Laterality: N/A;  ? BREAST BIOPSY Left 2002  ? CARDIAC CATHETERIZATION    ? CHOLECYSTECTOMY OPEN  1974  ? CORONARY ARTERY BYPASS GRAFT N/A 09/28/2012  ? Procedure: CORONARY ARTERY BYPASS GRAFTING (CABG);  Surgeon: [name], MD;  Location: [place];  Service: Open Heart Surgery;  Laterality: N/A;  CABG x four, using left internal mammary artery and left leg greater saphenous vein harvested endoscopically  ? [place] OF UTERUS  1983  ? EPICARDIAL PACING LEAD PLACEMENT N/A 09/28/2012  ? Procedure: EPICARDIAL PACING LEAD PLACEMENT;  Surgeon: [name], MD;  Location: [place];  Service: Thoracic;  Laterality: N/A;  LV LEAD PLACEMENT  ? HERNIA REPAIR    ? INCISIONAL HERNIA REPAIR N/A 05/25/2015  ? Procedure: LAPAROSCOPIC INCISIONAL HERNIA WITH MESH ;  Surgeon: [name], MD;  Location: [place];  Service: General;  Laterality: N/A;  ? INCONTINENCE SURGERY  2000  ? INSERTION OF MESH N/A 05/25/2015  ? Procedure: INSERTION OF MESH;  Surgeon: [name], MD;  Location: [place];  Service: General;  Laterality: N/A;  ? INTRAOPERATIVE TRANSESOPHAGEAL ECHOCARDIOGRAM N/A 09/28/2012  ? Procedure: INTRAOPERATIVE TRANSESOPHAGEAL ECHOCARDIOGRAM;  Surgeon: [name], MD;  Location: [place];  Service: Open Heart Surgery;  Laterality: N/A;  ? LAPAROSCOPIC INCISIONAL / UMBILICAL / VENTRAL HERNIA REPAIR  05/25/2015  ? IHR  ? LEFT HEART CATHETERIZATION WITH CORONARY ANGIOGRAM N/A 07/20/2012  ? Procedure: LEFT HEART CATHETERIZATION WITH CORONARY ANGIOGRAM;  Surgeon: [name], MD;  Location: [facility] CATH LAB;  Service: Cardiovascular;  Laterality: N/A;  ? MASTECTOMY Right 2002  ?  MASTECTOMY MODIFIED RADICAL W/ AXILLARY LYMPH NODES W/ OR W/O PECTORALIS MINOR Left 2002  ? MAZE N/A 09/28/2012  ? Procedure: MAZE;  Surgeon: [name], MD;  Location: [place];  Service: Open Heart Surgery;  Laterality: N/A;  ? ORIF WRIST FRACTURE Right 11/08/2017  ? Procedure: OPEN REDUCTION INTERNAL FIXATION (ORIF) WRIST FRACTURE;  Surgeon: [name], MD;  Location: [place];  Service: Orthopedics;  Laterality: Right;  90 mins  ? [place] N/A 04/17/2020  ? Procedure: PPM GENERATOR CHANGEOUT;  Surgeon: [name], MD;  Location: [place] CV LAB;  Service: Cardiovascular;  Laterality: N/A;  ? TENDON REPAIR Right 2001 X 3-4  ? torn ligaments and tendons in ankle up to knee from work related accident  ? TUBAL LIGATION  1987  ? ?Patient Active Problem List  ? Diagnosis Date Noted  ? Pacemaker battery depletion 04/17/2020  ? SOB (shortness of breath) 02/26/2018  ?  Vitamin D deficiency 02/22/2016  ? Long term current use of anticoagulant 11/10/2015  ? Type 2 diabetes mellitus with circulatory disorder, without long-term current use of insulin ([place]) 10/03/2015  ? Chronic pain disorder 10/03/2015  ? Generalized osteoarthrosis, involving multiple sites 08/25/2015  ? Other fatigue 08/11/2015  ? Back pain, chronic 08/11/2015  ? Incisional hernia 05/25/2015  ? Incisional hernia, without obstruction or gangrene 05/04/2015  ? Exertional dyspnea 08/05/2014  ? Routine general medical examination at a health care facility 04/14/2014  ? Anxiety state, unspecified 05/13/2013  ? Acute on chronic combined systolic and diastolic CHF, NYHA class 3 ([place]) 03/19/2013  ? High anion gap metabolic acidosis 91/69/4503  ? Biventricular cardiac pacemaker - St Jude, Nov 2014 03/04/2013  ? Chronic combined systolic and diastolic CHF, NYHA class 2 ([place]) 01/08/2013  ? S/P CABG x 4: 09/28/12 (LIMA-LAD, SVG-OM1-OM2, SVG-Intermediate) 09/29/2012  ? Right carotid bruit 08/11/2012  ? PAF (paroxysmal atrial fibrillation) ([place])  07/21/2012  ? Ischemic cardiomyopathy   ? CAD (coronary artery disease)   ? Anogenital warts 01/22/2011  ? Hyperlipidemia 09/19/2009  ? Essential hypertension 09/19/2009  ? MYOCARDIAL INFARCTION, HX OF 09/19/2009

## 2021-06-11 ENCOUNTER — Encounter: Payer: Self-pay | Admitting: Physical Therapy

## 2021-06-11 ENCOUNTER — Ambulatory Visit: Payer: Medicare HMO | Admitting: Physical Therapy

## 2021-06-11 DIAGNOSIS — M6281 Muscle weakness (generalized): Secondary | ICD-10-CM | POA: Diagnosis not present

## 2021-06-11 DIAGNOSIS — M25511 Pain in right shoulder: Secondary | ICD-10-CM

## 2021-06-11 DIAGNOSIS — M25612 Stiffness of left shoulder, not elsewhere classified: Secondary | ICD-10-CM

## 2021-06-11 NOTE — Therapy (Signed)
?OUTPATIENT PHYSICAL THERAPY SHOULDER TREATMENT NOTE ? ? ?Patient Name: [name] ?MRN: [id number] ?DOB:1951/01/27, 71 y.o., female ?Today's Date: 06/11/2021 ? ? PT End of Session - 06/11/21 1334   ? ? Visit Number 2   ? Number of Visits 12   ? Date for PT Re-Evaluation 07/05/21   ? Authorization Type [employer]   ? Authorization - Visit Number 1   ? Authorization - Number of Visits 12   ? Progress Note Due on Visit 10   ? PT Start Time 1302   ? PT Stop Time 1342   ? PT Time Calculation (min) 40 min   ? Activity Tolerance Patient tolerated treatment well   ? Behavior During Therapy [facility] for tasks assessed/performed   ? ?  ?  ? ?  ? ? ? ?Past Medical History:  ?Diagnosis Date  ? Acute on chronic combined systolic and diastolic CHF, NYHA class 3 ([place]) 03/19/2013  ? AICD (automatic cardioverter/defibrillator) present 01/2013  ? Biventricular cardiac pacemaker in situ   ? Allergic rhinitis   ? Anogenital warts 01/22/2011  ? Anxiety state, unspecified 05/13/2013  ? BREAST CANCER, HX OF 09/19/2009  ? Qualifier: Diagnosis of  By: [name], [name]   ? CAD (coronary artery disease)   ? a. 2004: s/p MI in [place]. No PCI->Medical RX;  b. 07/2012 Cath: LM 30-40, LAD 70p, 70/32m D1 80-90p, OM1 small 90p, OM2 large 80-90p, 575m70-80d, RCA 20-30 diff, EF 40%, glob HK.s/p CABG  ? [facility]  ? Patient reports left breast cancer diagnosis in 2002 treated with bilateral mastectomy positive lymph nodes with left axillary dissection followed by chemotherapy of unknown type  ? Cataract   ? Chronic combined systolic and diastolic CHF, NYHA class 2 (HCGarden City Park10/31/2014  ? Diabetes mellitus type II   ? Exertional dyspnea 08/05/2014  ? Exertional shortness of breath   ? Generalized osteoarthrosis, involving multiple sites 08/25/2015  ? Hyperlipidemia   ? Hypertension   ? Incisional hernia, without obstruction or gangrene 05/04/2015  ? Ischemic cardiomyopathy   ? a. 07/2012 Echo: EF 35%, Sev inferoseptal HK, mildly dil LA, Peak  PASP 5969m.  ? LBBB (left bundle branch block)   ? a. intermittent - present during rapid afib 07/2012.  ? MYOCARDIAL INFARCTION, HX OF 09/19/2009  ? Qualifier: Diagnosis of  By: TodSherren Mocha, JefJory Ee? Neuromuscular disorder (HCC[place] ? Patient reports chronic numbness in the right foot related to previous surgery on the right leg and "nerve damage"  ? PAF (paroxysmal atrial fibrillation) ([place]CC[place] ? a. 07/2012: Amio and xarelto initiated.  ? Right carotid bruit 08/11/2012  ? SBO (small bowel obstruction) ([place]CC[place] ? SOB (shortness of breath) 02/26/2018  ? Type 2 diabetes mellitus with circulatory disorder, without long-term current use of insulin (HCCNorth [place]/25/2017  ? Vitamin D deficiency 02/22/2016  ? ?Past Surgical History:  ?Procedure Laterality Date  ? ABDOMINAL HYSTERECTOMY  2000  ? APPENDECTOMY  1974  ? BI-VENTRICULAR PACEMAKER INSERTION N/A 01/25/2013  ? Procedure: BI-VENTRICULAR PACEMAKER INSERTION (CRT-P);  Surgeon: GreEvans LanceD;  [facility];  Service: Cardiovascular;  Laterality: N/A;  ? BREAST BIOPSY Left 2002  ? CARDIAC CATHETERIZATION    ? CHOLECYSTECTOMY OPEN  1974  ? CORONARY ARTERY BYPASS GRAFT N/A 09/28/2012  ? Procedure: CORONARY ARTERY BYPASS GRAFTING (CABG);  Surgeon: EdwGrace IsaacD;  [facility];  Laterality: N/A;  CABG x four, using left internal mammary artery and left leg greater saphenous vein harvested endoscopically  ? [place] OF UTERUS  1983  ? EPICARDIAL PACING LEAD PLACEMENT N/A 09/28/2012  ? Procedure: EPICARDIAL PACING LEAD PLACEMENT;  Surgeon: [name], MD;  Location: [place];  Service: Thoracic;  Laterality: N/A;  LV LEAD PLACEMENT  ? HERNIA REPAIR    ? INCISIONAL HERNIA REPAIR N/A 05/25/2015  ? Procedure: LAPAROSCOPIC INCISIONAL HERNIA WITH MESH ;  Surgeon: [name], MD;  Location: [place];  Service: General;  Laterality: N/A;  ? INCONTINENCE SURGERY  2000  ? INSERTION OF MESH N/A 05/25/2015  ? Procedure:  INSERTION OF MESH;  Surgeon: [name], MD;  Location: [place];  Service: General;  Laterality: N/A;  ? INTRAOPERATIVE TRANSESOPHAGEAL ECHOCARDIOGRAM N/A 09/28/2012  ? Procedure: INTRAOPERATIVE TRANSESOPHAGEAL ECHOCARDIOGRAM;  Surgeon: [name], MD;  Location: [place];  Service: Open Heart Surgery;  Laterality: N/A;  ? LAPAROSCOPIC INCISIONAL / UMBILICAL / VENTRAL HERNIA REPAIR  05/25/2015  ? IHR  ? LEFT HEART CATHETERIZATION WITH CORONARY ANGIOGRAM N/A 07/20/2012  ? Procedure: LEFT HEART CATHETERIZATION WITH CORONARY ANGIOGRAM;  Surgeon: [name], MD;  Location[facility] CATH LAB;  Service: Cardiovascular;  Laterality: N/A;  ? MASTECTOMY Right 2002  ? MASTECTOMY MODIFIED RADICAL W/ AXILLARY LYMPH NODES W/ OR W/O PECTORALIS MINOR Left 2002  ? MAZE N/A 09/28/2012  ? Procedure: MAZE;  Surgeon: [name], MD;  Location: [place];  Service: Open Heart Surgery;  Laterality: N/A;  ? ORIF WRIST FRACTURE Right 11/08/2017  ? Procedure: OPEN REDUCTION INTERNAL FIXATION (ORIF) WRIST FRACTURE;  Surgeon: [name], MD;  Location: [place];  Service: Orthopedics;  Laterality: Right;  90 mins  ? [place] N/A 04/17/2020  ? Procedure: PPM GENERATOR CHANGEOUT;  Surgeon: [name], MD;  Location: [place] CV LAB;  Service: Cardiovascular;  Laterality: N/A;  ? TENDON REPAIR Right 2001 X 3-4  ? torn ligaments and tendons in ankle up to knee from work related accident  ? TUBAL LIGATION  1987  ? ?Patient Active Problem List  ? Diagnosis Date Noted  ? Pacemaker battery depletion 04/17/2020  ? SOB (shortness of breath) 02/26/2018  ? Vitamin D deficiency 02/22/2016  ? Long term current use of anticoagulant 11/10/2015  ? Type 2 diabetes mellitus with circulatory disorder, without long-term current use of insulin ([place]) 10/03/2015  ? Chronic pain disorder 10/03/2015  ? Generalized osteoarthrosis, involving multiple sites 08/25/2015  ? Other fatigue 08/11/2015  ? Back pain, chronic 08/11/2015  ? Incisional  hernia 05/25/2015  ? Incisional hernia, without obstruction or gangrene 05/04/2015  ? Exertional dyspnea 08/05/2014  ? Routine general medical examination at a health care facility 04/14/2014  ? Anxiety state, unspecified 05/13/2013  ? Acute on chronic combined systolic and diastolic CHF, NYHA class 3 ([place]) 03/19/2013  ? High anion gap metabolic acidosis 02/63/7858  ? Biventricular cardiac pacemaker - St Jude, Nov 2014 03/04/2013  ? Chronic combined systolic and diastolic CHF, NYHA class 2 ([place]) 01/08/2013  ? S/P CABG x 4: 09/28/12 (LIMA-LAD, SVG-OM1-OM2, SVG-Intermediate) 09/29/2012  ? Right carotid bruit 08/11/2012  ? PAF (paroxysmal atrial fibrillation) ([place]) 07/21/2012  ? Ischemic cardiomyopathy   ? CAD (coronary artery disease)   ? Anogenital warts 01/22/2011  ? Hyperlipidemia 09/19/2009  ? Essential hypertension 09/19/2009  ? MYOCARDIAL INFARCTION, HX OF 09/19/2009  ? Allergic rhinitis 09/19/2009  ? BREAST CANCER, HX OF 09/19/2009  ? ? ?PCP: Isaac Bliss, Rayford Halsted,  MD ? ?REFERRING PROVIDER: Newt Minion, MD ? ?REFERRING DIAG: M25.512,G89.29 (ICD-10-CM) - Chronic left shoulder pain M75.02 (ICD-10-CM) - Adhesive capsulitis of left shoulder  ? ?THERAPY DIAG:  ?Stiffness of left shoulder, not elsewhere classified ? ?Right shoulder pain, unspecified chronicity ? ?Muscle weakness (generalized) ? ? ?ONSET DATE: 4-5 months ? ?SUBJECTIVE:                                                                                                                                                                                     ? ?SUBJECTIVE STATEMENT: ?Pt arriving today with 6/10 pain in left anterior shoulder. Pt stating reaching outward and up causes more pain.  ? ? ?PERTINENT HISTORY: ?Cardiac history, breast cancer survivor (double mastectomy), DM Type 2, former smoker. ? ?PAIN:  ?Are you having pain? Yes: NPRS scale: 6/10 ?Pain location: Left shoulder anterior ?Pain description: Achy, tight, sharp ?Aggravating factors: L  shoulder use and at night ?Relieving factors: Rest and pain-free movement ? ?PRECAUTIONS: None ? ?WEIGHT BEARING RESTRICTIONS No ? ?FALLS:  ?Has patient fallen in last 6 months? No Number of falls: 0 ? ?LIVING

## 2021-06-12 ENCOUNTER — Encounter: Payer: Self-pay | Admitting: Internal Medicine

## 2021-06-13 ENCOUNTER — Encounter: Payer: Self-pay | Admitting: Rehabilitative and Restorative Service Providers"

## 2021-06-13 ENCOUNTER — Ambulatory Visit: Payer: Medicare HMO | Admitting: Rehabilitative and Restorative Service Providers"

## 2021-06-13 DIAGNOSIS — M6281 Muscle weakness (generalized): Secondary | ICD-10-CM | POA: Diagnosis not present

## 2021-06-13 DIAGNOSIS — M25612 Stiffness of left shoulder, not elsewhere classified: Secondary | ICD-10-CM | POA: Diagnosis not present

## 2021-06-13 NOTE — Therapy (Addendum)
OUTPATIENT PHYSICAL THERAPY SHOULDER TREATMENT NOTE   Patient Name: [name] MRN: [id number] DOB:1951-02-08, 71 y.o., female Today's Date: 06/13/2021  PHYSICAL THERAPY DISCHARGE SUMMARY  Visits from Start of Care: 3  Current functional level related to goals / functional outcomes: See note   Remaining deficits: See note   Education / Equipment: HEP   Patient agrees to discharge. Patient goals were partially met. Patient is being discharged due to not returning since the last visit.    PT End of Session - 06/13/21 1021     Visit Number 3    Number of Visits 12    Date for PT Re-Evaluation 07/05/21    Authorization Type Humana    Authorization - Visit Number 3    Authorization - Number of Visits 12    Progress Note Due on Visit 10    PT Start Time 7902    PT Stop Time 1055    PT Time Calculation (min) 40 min    Activity Tolerance Patient tolerated treatment well    Behavior During Therapy WFL for tasks assessed/performed               Past Medical History:  Diagnosis Date   Acute on chronic combined systolic and diastolic CHF, NYHA class 3 ([place]) 03/19/2013   AICD (automatic cardioverter/defibrillator) present 01/2013   Biventricular cardiac pacemaker in situ    Allergic rhinitis    Anogenital warts 01/22/2011   Anxiety state, unspecified 05/13/2013   BREAST CANCER, HX OF 09/19/2009   Qualifier: Diagnosis of  By: [name], [name]    CAD (coronary artery disease)    a. 2004: s/p MI in [place]. No PCI->Medical RX;  b. 07/2012 Cath: LM 30-40, LAD 70p, 70/2m D1 80-90p, OM1 small 90p, OM2 large 80-90p, 552m70-80d, RCA 20-30 diff, EF 40%, glob HK.s/p CABG   Cancer of left breast (HCRidgeland2002   Patient reports left breast cancer diagnosis in 2002 treated with bilateral mastectomy positive lymph nodes with left axillary dissection followed by chemotherapy of unknown type   Cataract    Chronic combined systolic and diastolic CHF, NYHA class 2 (HCAllensville10/31/2014    Diabetes mellitus type II    Exertional dyspnea 08/05/2014   Exertional shortness of breath    Generalized osteoarthrosis, involving multiple sites 08/25/2015   Hyperlipidemia    Hypertension    Incisional hernia, without obstruction or gangrene 05/04/2015   Ischemic cardiomyopathy    a. 07/2012 Echo: EF 35%, Sev inferoseptal HK, mildly dil LA, Peak PASP 5939m.   LBBB (left bundle branch block)    a. intermittent - present during rapid afib 07/2012.   MYOCARDIAL INFARCTION, HX OF 09/19/2009   Qualifier: Diagnosis of  By: TodSherren Mocha, JefJory Ee [facility]romuscular disorder ([facility]  Patient reports chronic numbness in the right foot related to previous surgery on the right leg and "nerve damage"   PAF (paroxysmal atrial fibrillation) (HCCB[place]  a. 07/2012: Amio and xarelto initiated.   Right carotid bruit 08/11/2012   SBO (small bowel obstruction) (HCC)    SOB (shortness of breath) 02/26/2018   Type 2 diabetes mellitus with circulatory disorder, without long-term current use of insulin (HCCRaymond/25/2017   Vitamin D deficiency 02/22/2016   Past Surgical History:  Procedure Laterality Date   ABDOMINAL HYSTERECTOMY  2000   APPENDECTOMY  1974   BI-VENTRICULAR PACEMAKER INSERTION N/A 01/25/2013   Procedure: BI-VENTRICULAR PACEMAKER INSERTION (CRT-P);  Surgeon: GreEvans LanceD;  Location:  [place] CATH LAB;  Service: Cardiovascular;  Laterality: N/A;   BREAST BIOPSY Left 2002   CARDIAC CATHETERIZATION     CHOLECYSTECTOMY OPEN  1974   CORONARY ARTERY BYPASS GRAFT N/A 09/28/2012   Procedure: CORONARY ARTERY BYPASS GRAFTING (CABG);  Surgeon: [name], MD;  Location: [place];  Service: Open Heart Surgery;  Laterality: N/A;  CABG x four, using left internal mammary artery and left leg greater saphenous vein harvested endoscopically   DILATION AND CURETTAGE OF UTERUS  1983   EPICARDIAL PACING LEAD PLACEMENT N/A 09/28/2012   Procedure: EPICARDIAL PACING LEAD PLACEMENT;  Surgeon: [name], MD;  Location:  [place];  Service: Thoracic;  Laterality: N/A;  LV LEAD PLACEMENT   HERNIA REPAIR     INCISIONAL HERNIA REPAIR N/A 05/25/2015   Procedure: LAPAROSCOPIC INCISIONAL HERNIA WITH MESH ;  Surgeon: [name], MD;  Location: [place];  Service: General;  Laterality: N/A;   INCONTINENCE SURGERY  2000   INSERTION OF MESH N/A 05/25/2015   Procedure: INSERTION OF MESH;  Surgeon: [name], MD;  Location: [place];  Service: General;  Laterality: N/A;   INTRAOPERATIVE TRANSESOPHAGEAL ECHOCARDIOGRAM N/A 09/28/2012   Procedure: INTRAOPERATIVE TRANSESOPHAGEAL ECHOCARDIOGRAM;  Surgeon: [name], MD;  Location: [place];  Service: Open Heart Surgery;  Laterality: N/A;   LAPAROSCOPIC INCISIONAL / UMBILICAL / VENTRAL HERNIA REPAIR  05/25/2015   IHR   LEFT HEART CATHETERIZATION WITH CORONARY ANGIOGRAM N/A 07/20/2012   Procedure: LEFT HEART CATHETERIZATION WITH CORONARY ANGIOGRAM;  Surgeon: [name], MD;  Location: [facility] CATH LAB;  Service: Cardiovascular;  Laterality: N/A;   MASTECTOMY Right 2002   MASTECTOMY MODIFIED RADICAL W/ AXILLARY LYMPH NODES W/ OR W/O PECTORALIS MINOR Left 2002   MAZE N/A 09/28/2012   Procedure: MAZE;  Surgeon: [name], MD;  Location: [place];  Service: Open Heart Surgery;  Laterality: N/A;   ORIF WRIST FRACTURE Right 11/08/2017   Procedure: OPEN REDUCTION INTERNAL FIXATION (ORIF) WRIST FRACTURE;  Surgeon: [name], MD;  Location: [place];  Service: Orthopedics;  Laterality: Right;  90 mins   PPM GENERATOR CHANGEOUT N/A 04/17/2020   Procedure: PPM GENERATOR CHANGEOUT;  Surgeon: [name], MD;  Location: [place] CV LAB;  Service: Cardiovascular;  Laterality: N/A;   TENDON REPAIR Right 2001 X 3-4   torn ligaments and tendons in ankle up to knee from work related accident   [place]   Patient Active Problem List   Diagnosis Date Noted   Pacemaker battery depletion 04/17/2020   SOB (shortness of breath) 02/26/2018   Vitamin D deficiency  02/22/2016   Long term current use of anticoagulant 11/10/2015   Type 2 diabetes mellitus with circulatory disorder, without long-term current use of insulin ([place]) 10/03/2015   Chronic pain disorder 10/03/2015   Generalized osteoarthrosis, involving multiple sites 08/25/2015   Other fatigue 08/11/2015   Back pain, chronic 08/11/2015   Incisional hernia 05/25/2015   Incisional hernia, without obstruction or gangrene 05/04/2015   Exertional dyspnea 08/05/2014   Routine general medical examination at a health care facility 04/14/2014   Anxiety state, unspecified 05/13/2013   Acute on chronic combined systolic and diastolic CHF, NYHA class 3 ([place]) 03/19/2013   High anion gap metabolic acidosis 25/36/6440   Biventricular cardiac pacemaker - St Jude, Nov 2014 03/04/2013   Chronic combined systolic and diastolic CHF, NYHA class 2 ([place]) 01/08/2013   S/P CABG x 4: 09/28/12 (LIMA-LAD, SVG-OM1-OM2, SVG-Intermediate) 09/29/2012   Right carotid bruit  08/11/2012   PAF (paroxysmal atrial fibrillation) ([place]) 07/21/2012   Ischemic cardiomyopathy    CAD (coronary artery disease)    Anogenital warts 01/22/2011   Hyperlipidemia 09/19/2009   Essential hypertension 09/19/2009   MYOCARDIAL INFARCTION, HX OF 09/19/2009   Allergic rhinitis 09/19/2009   BREAST CANCER, HX OF 09/19/2009    PCP: Isaac Bliss, Rayford Halsted, MD  REFERRING PROVIDER: Newt Minion, MD  REFERRING DIAG: [phone] (ICD-10-CM) - Chronic left shoulder pain M75.02 (ICD-10-CM) - Adhesive capsulitis of left shoulder   THERAPY DIAG:  Stiffness of left shoulder, not elsewhere classified  Muscle weakness (generalized)   ONSET DATE: 4-5 months  SUBJECTIVE:                                                                                                                                                                                      SUBJECTIVE STATEMENT: [name] reports she is better overall since starting PT.  She has been  doing more around the house and sleep is overall better.  She does note that she is more sore today possibly related to babysitting duties.   PERTINENT HISTORY: Cardiac history, breast cancer survivor (double mastectomy), DM Type 2, former smoker.  PAIN:  Are you having pain? Yes: NPRS scale: 3-6/10 Pain location: Left shoulder anterior Pain description: Achy, tight, sharp Aggravating factors: L shoulder use and at night Relieving factors: Rest and pain-free movement  PRECAUTIONS: None  WEIGHT BEARING RESTRICTIONS No  FALLS:  Has patient fallen in last 6 months? No Number of falls: 0  LIVING ENVIRONMENT: Lives with: lives with their family and lives with their spouse  OCCUPATION: Retired, Psychologist, occupational  PLOF: Independent  PATIENT GOALS Return pain-free L shoulder function  OBJECTIVE:   DIAGNOSTIC FINDINGS:  AROM, capsular tightness, strength, pain and functional impairments in need of skilled care.  PATIENT SURVEYS:  FOTO 40 (Goal 60 in  13 visits)  COGNITION:  Overall cognitive status: Within functional limits for tasks assessed     POSTURE: Mild IR and protracted shoulders  UPPER EXTREMITY ROM:   ROM Right AROM 05/24/2021 Left AROM 05/24/2021 Left PROM In supine 06/11/2021 Left PROM supine 06/13/2021  Shoulder flexion 155 130 150 150  Shoulder extension      Shoulder abduction      Shoulder horizontal adduction 50 35  55  Shoulder internal rotation 65 30 60 deg c shoulder abd 45 degrees 45 supine at 70 degrees abduction (elbow above shoulder)  Shoulder external rotation 90 50 20 deg c shoulder abd 45 degrees 90 degrees (same position as ER)  Elbow flexion      Elbow extension      Wrist flexion  Wrist extension      Wrist ulnar deviation      Wrist radial deviation      Wrist pronation      Wrist supination      (Blank rows = not tested)  UPPER EXTREMITY MMT:  Hand held dynamometer in pounds Right 05/24/2021 Left 05/24/2021 Left 06/13/2021  Shoulder  flexion     Shoulder extension     Shoulder abduction     Shoulder adduction     Shoulder internal rotation 23.1 19.8 20.1  Shoulder external rotation 15.5 10.0 10.6  Middle trapezius     Lower trapezius     Elbow flexion     Elbow extension     Wrist flexion     Wrist extension     Wrist ulnar deviation     Wrist radial deviation     Wrist pronation     Wrist supination     Grip strength (lbs)     (Blank rows = not tested)   TODAY'S TREATMENT:  06/13/2021: *Supine arm raises 20X 3 seconds with 5# Supine shoulder *IR/ER stretch 10X 10 seconds *Shoulder blade pinches 10X 5 seconds *Supine shoulder flexion AAROM 10X 10 seconds (protract 1st and keep elbow in by ear)  Theraband IR Green 10X slow eccentrics  *Theraband ER Red 2 sets of 10 slow eccentrics  * means HEP   06/11/2021:    Pulleys: flexion and scaption x 2 minutes each   Door stretch x 3 (60 degrees, 90 degrees and 110 degrees)   Standing AAROM flexion using 1 # bar x 15   Supine AAROM flexion using 1 # bar x 20, with more range noted   Supine AAROM shoulder ER 1 # bar x 20    Standing: Rows Level 3 theraband 2 x10   Standing: shoulder ext Level 3 theraband 2 x 10   Wall ladder x 10   UBE: Level 1.5 (3 minutes each direction)   Manual: Grade 2-3 GH AP and inferior joint mobs     05/24/2021 Supine arm raises 10X 3 seconds Supine shoulder IR/ER stretch 10X 10 seconds Shoulder blade pinches 10X 5 seconds Supine shoulder flexion AAROM 10X 10 seconds (protract 1st and keep elbow in by ear)   PATIENT EDUCATION: Education details: Review exam HEP, shoulder anatomy review Person educated: Patient Education method: Explanation, Demonstration, Tactile cues, Verbal cues, and Handouts Education comprehension: verbalized understanding, returned demonstration, verbal cues required, tactile cues required, and needs further education   HOME EXERCISE PROGRAM: Access Code: [email or web address] URL:  [email or web address] Date: 06/13/2021 Prepared by: Vista Mink  Exercises - Standing Scapular Retraction  - 5 x daily - 7 x weekly - 1 sets - 5 reps - 5 second hold - Supine Scapular Protraction in Flexion with Dumbbells  - 2 x daily - 7 x weekly - 1 sets - 20 reps - 3 seconds hold - Supine Shoulder Internal Rotation Stretch  - 2 x daily - 7 x weekly - 1 sets - 20 reps - 10 seconds hold - Supine Shoulder Flexion AAROM with Hands Clasped  - 2 x daily - 7 x weekly - 1 sets - 10 reps - 10 seconds hold - Shoulder External Rotation with Anchored Resistance  - 1 x daily - 7 x weekly - 2 sets - 10 reps - 3 hold   ASSESSMENT:  CLINICAL IMPRESSION 4/5//2023: [name] reports excellent early functional progress with her shoulder rehabilitation.  She has been a bit more irritated the past few  days due to lifting and caring for her granddaughter.  AROM is significantly improved while strength will benefit from continued work.  Strength progressions were started today.   06/11/2021:  Pt arriving today reporting compliance in her HEP. Pt still reporting increased pain up to 10/10 at times. Pt did however report she was able to perform some yard work this weekend with limited mobility.  Pt tolerating exercises well today with improved ROM from initial visit. Continue skilled PT to maximize function.    OBJECTIVE IMPAIRMENTS decreased ROM, decreased strength, increased edema, impaired perceived functional ability, impaired flexibility, impaired UE functional use, and pain.   ACTIVITY LIMITATIONS community activity.   PERSONAL FACTORS Cardiac history, breast cancer survivor (double mastectomy), DM Type 2, former smoker are also affecting patient's functional outcome.    REHAB POTENTIAL: Good  CLINICAL DECISION MAKING: Stable/uncomplicated  EVALUATION COMPLEXITY: Low   GOALS: Goals reviewed with patient? Yes  SHORT TERM GOALS: Target date: 06/21/2021  [name] will be independent  with her day 1 starter HEP. Baseline: Started 05/24/2021 Goal status: Met 06/13/2021  LONG TERM GOALS: Target date: 07/25/2021  Improve FOTO to 60. Baseline: 40 Goal status: INITIAL  2.  Improve L shoulder pain to consistently 0-2/10 on the VAS. Baseline: Can be 7/10 Goal status: INITIAL  3.  Improve B shoulder AROM for flexion to 170; ER to 90; IR to 60 and horizontal adduction to 40 degrees. Baseline: See objective Goal status: On Going (See 06/13/2021 numbers)  4.  Improve L shoulder strength for ER to 16 pounds and IR to 24 pounds. Baseline: See objective Goal status: On Going (See 06/13/2021 numbers)  5.  [name] will be independent and compliant with her HEP at DC. Baseline: Started today. Goal status: INITIAL   PLAN: PT FREQUENCY: 1-2x/week  PT DURATION: 6 weeks  PLANNED INTERVENTIONS: Therapeutic exercises, Therapeutic activity, Neuromuscular re-education, Patient/Family education, Joint mobilization, Cryotherapy, Moist heat, and Manual therapy  PLAN FOR NEXT SESSION: Continue to progress scapular and RTC strength.  Check AROM, strength, FOTO (RA).   Farley Ly, PT, MPT 06/13/2021, 11:00 AM

## 2021-06-18 ENCOUNTER — Other Ambulatory Visit: Payer: Self-pay | Admitting: Internal Medicine

## 2021-06-18 DIAGNOSIS — E1159 Type 2 diabetes mellitus with other circulatory complications: Secondary | ICD-10-CM

## 2021-06-20 ENCOUNTER — Encounter: Payer: Medicare HMO | Admitting: Rehabilitative and Restorative Service Providers"

## 2021-06-20 ENCOUNTER — Encounter: Payer: Self-pay | Admitting: Internal Medicine

## 2021-06-20 DIAGNOSIS — K432 Incisional hernia without obstruction or gangrene: Secondary | ICD-10-CM | POA: Diagnosis not present

## 2021-06-20 DIAGNOSIS — K4090 Unilateral inguinal hernia, without obstruction or gangrene, not specified as recurrent: Secondary | ICD-10-CM | POA: Diagnosis not present

## 2021-06-22 ENCOUNTER — Encounter: Payer: Medicare HMO | Admitting: Rehabilitative and Restorative Service Providers"

## 2021-06-27 ENCOUNTER — Encounter: Payer: Medicare HMO | Admitting: Rehabilitative and Restorative Service Providers"

## 2021-06-29 ENCOUNTER — Encounter: Payer: Medicare HMO | Admitting: Rehabilitative and Restorative Service Providers"

## 2021-07-03 ENCOUNTER — Other Ambulatory Visit: Payer: Self-pay | Admitting: Internal Medicine

## 2021-07-03 ENCOUNTER — Ambulatory Visit (INDEPENDENT_AMBULATORY_CARE_PROVIDER_SITE_OTHER): Payer: Medicare HMO | Admitting: Internal Medicine

## 2021-07-03 ENCOUNTER — Encounter: Payer: Self-pay | Admitting: Internal Medicine

## 2021-07-03 VITALS — BP 138/76 | HR 86 | Temp 98.0°F | Ht 64.5 in | Wt 151.8 lb

## 2021-07-03 DIAGNOSIS — E559 Vitamin D deficiency, unspecified: Secondary | ICD-10-CM

## 2021-07-03 DIAGNOSIS — Z124 Encounter for screening for malignant neoplasm of cervix: Secondary | ICD-10-CM | POA: Diagnosis not present

## 2021-07-03 DIAGNOSIS — E1159 Type 2 diabetes mellitus with other circulatory complications: Secondary | ICD-10-CM

## 2021-07-03 DIAGNOSIS — Z1211 Encounter for screening for malignant neoplasm of colon: Secondary | ICD-10-CM

## 2021-07-03 DIAGNOSIS — R5383 Other fatigue: Secondary | ICD-10-CM

## 2021-07-03 DIAGNOSIS — G4730 Sleep apnea, unspecified: Secondary | ICD-10-CM

## 2021-07-03 LAB — POCT GLYCOSYLATED HEMOGLOBIN (HGB A1C): Hemoglobin A1C: 6.6 % — AB (ref 4.0–5.6)

## 2021-07-03 LAB — COMPREHENSIVE METABOLIC PANEL
ALT: 16 U/L (ref 0–35)
AST: 15 U/L (ref 0–37)
Albumin: 4.2 g/dL (ref 3.5–5.2)
Alkaline Phosphatase: 47 U/L (ref 39–117)
BUN: 9 mg/dL (ref 6–23)
CO2: 28 mEq/L (ref 19–32)
Calcium: 9.4 mg/dL (ref 8.4–10.5)
Chloride: 100 mEq/L (ref 96–112)
Creatinine, Ser: 0.51 mg/dL (ref 0.40–1.20)
GFR: 95.3 mL/min (ref >60.00)
Glucose, Bld: 155 mg/dL — ABNORMAL HIGH (ref 70–99)
Potassium: 4.4 mEq/L (ref 3.5–5.1)
Sodium: 139 mEq/L (ref 135–145)
Total Bilirubin: 0.6 mg/dL (ref 0.2–1.2)
Total Protein: 7.7 g/dL (ref 6.0–8.3)

## 2021-07-03 LAB — CBC WITH DIFFERENTIAL/PLATELET
Basophils Absolute: 0 10*3/uL (ref 0.0–0.1)
Basophils Relative: 0.4 % (ref 0.0–3.0)
Eosinophils Absolute: 0.2 10*3/uL (ref 0.0–0.7)
Eosinophils Relative: 2.1 % (ref 0.0–5.0)
HCT: 38.4 % (ref 36.0–46.0)
Hemoglobin: 13 g/dL (ref 12.0–15.0)
Lymphocytes Relative: 16.8 % (ref 12.0–46.0)
Lymphs Abs: 1.5 10*3/uL (ref 0.7–4.0)
MCHC: 33.8 g/dL (ref 30.0–36.0)
MCV: 89.4 fl (ref 78.0–100.0)
Monocytes Absolute: 0.5 10*3/uL (ref 0.1–1.0)
Monocytes Relative: 5.4 % (ref 3.0–12.0)
Neutro Abs: 6.6 10*3/uL (ref 1.4–7.7)
Neutrophils Relative %: 75.3 % (ref 43.0–77.0)
Platelets: 234 10*3/uL (ref 150.0–400.0)
RBC: 4.29 Mil/uL (ref 3.87–5.11)
RDW: 13.2 % (ref 11.5–15.5)
WBC: 8.8 10*3/uL (ref 4.0–10.5)

## 2021-07-03 LAB — MICROALBUMIN / CREATININE URINE RATIO
Creatinine,U: 30.3 mg/dL
Microalb Creat Ratio: 2.7 mg/g (ref 0.0–30.0)
Microalb, Ur: 0.8 mg/dL (ref 0.0–1.9)

## 2021-07-03 LAB — VITAMIN B12: Vitamin B-12: 337 pg/mL (ref 211–911)

## 2021-07-03 LAB — VITAMIN D 25 HYDROXY (VIT D DEFICIENCY, FRACTURES): VITD: 18.13 ng/mL — ABNORMAL LOW (ref 30.00–100.00)

## 2021-07-03 LAB — TSH: TSH: 1.46 u[IU]/mL (ref 0.35–5.50)

## 2021-07-03 MED ORDER — VITAMIN D (ERGOCALCIFEROL) 1.25 MG (50000 UNIT) PO CAPS: 50000.0000 [IU] | ORAL_CAPSULE | ORAL | 0 refills | Status: DC

## 2021-07-03 NOTE — Progress Notes (Signed)
? ? ? ?Established Patient Office Visit ? ? ? ? ?This visit occurred during the SARS-CoV-2 public health emergency.  Safety protocols were in place, including screening questions prior to the visit, additional usage of staff PPE, and extensive cleaning of exam room while observing appropriate contact time as indicated for disinfecting solutions.  ? ? ?CC/Reason for Visit: Fatigue ? ?HPI: [name] is a 71 y.o. female who is coming in today for the above mentioned reasons. Past Medical History is significant for: coronary artery disease, A. fib, diabetes that has been well controlled and hyperlipidemia.  She comes in today wanting to discuss fatigue.  This has been ongoing for a few months but she has noted it particularly worse over the last 2 to 3 weeks.  She has difficulty getting up in the mornings.  Has not been able to volunteer in church due to it.  She has noted that she has also become very cold.  She does not sleep well at night.  She has been told she snores.  She is not aware that she wakes up multiple times at nighttime.  She has never had a sleep study.  She is overdue for age-appropriate screening.  She has had a history of breast cancer with bilateral mastectomies and has had a total reconstruction so mammograms are no longer necessary.  No chest pain or shortness of breath. ? ? ?Past Medical/Surgical History: ?Past Medical History:  ?Diagnosis Date  ? Acute on chronic combined systolic and diastolic CHF, NYHA class 3 ([place]) 03/19/2013  ? AICD (automatic cardioverter/defibrillator) present 01/2013  ? Biventricular cardiac pacemaker in situ   ? Allergic rhinitis   ? Anogenital warts 01/22/2011  ? Anxiety state, unspecified 05/13/2013  ? BREAST CANCER, HX OF 09/19/2009  ? Qualifier: Diagnosis of  By: [name], [name]   ? CAD (coronary artery disease)   ? a. 2004: s/p MI in [place]. No PCI->Medical RX;  b. 07/2012 Cath: LM 30-40, LAD 70p, 70/31m D1 80-90p, OM1 small 90p, OM2 large 80-90p, 521m70-80d,  RCA 20-30 diff, EF 40%, glob HK.s/p CABG  ? [facility]  ? Patient reports left breast cancer diagnosis in 2002 treated with bilateral mastectomy positive lymph nodes with left axillary dissection followed by chemotherapy of unknown type  ? Cataract   ? Chronic combined systolic and diastolic CHF, NYHA class 2 (HCSardis10/31/2014  ? Diabetes mellitus type II   ? Exertional dyspnea 08/05/2014  ? Exertional shortness of breath   ? Generalized osteoarthrosis, involving multiple sites 08/25/2015  ? Hyperlipidemia   ? Hypertension   ? Incisional hernia, without obstruction or gangrene 05/04/2015  ? Ischemic cardiomyopathy   ? a. 07/2012 Echo: EF 35%, Sev inferoseptal HK, mildly dil LA, Peak PASP 5953m.  ? LBBB (left bundle branch block)   ? a. intermittent - present during rapid afib 07/2012.  ? MYOCARDIAL INFARCTION, HX OF 09/19/2009  ? Qualifier: Diagnosis of  By: TodSherren Mocha, JefJory Ee? Neuromuscular disorder (HCC[place]armony ? Patient reports chronic numbness in the right foot related to previous surgery on the right leg and "nerve damage"  ? PAF (paroxysmal atrial fibrillation) (HCCMahaffe[place] ? a. 07/2012: Amio and xarelto initiated.  ? Right carotid bruit 08/11/2012  ? SBO (small bowel obstruction) ([place]CC[place] ? SOB (shortness of breath) 02/26/2018  ? Type 2 diabetes mellitus with circulatory disorder, without long-term current use of insulin (HCCFort Cobb/25/2017  ? Vitamin D deficiency  02/22/2016  ? ? ?Past Surgical History:  ?Procedure Laterality Date  ? ABDOMINAL HYSTERECTOMY  2000  ? APPENDECTOMY  1974  ? BI-VENTRICULAR PACEMAKER INSERTION N/A 01/25/2013  ? Procedure: BI-VENTRICULAR PACEMAKER INSERTION (CRT-P);  Surgeon: Evans Lance, MD;  Location[facility];  Service: Cardiovascular;  Laterality: N/A;  ? BREAST BIOPSY Left 2002  ? CARDIAC CATHETERIZATION    ? CHOLECYSTECTOMY OPEN  1974  ? CORONARY ARTERY BYPASS GRAFT N/A 09/28/2012  ? Procedure: CORONARY ARTERY BYPASS GRAFTING (CABG);  Surgeon: [name], MD;   Location: [place];  Service: Open Heart Surgery;  Laterality: N/A;  CABG x four, using left internal mammary artery and left leg greater saphenous vein harvested endoscopically  ? [place] OF UTERUS  1983  ? EPICARDIAL PACING LEAD PLACEMENT N/A 09/28/2012  ? Procedure: EPICARDIAL PACING LEAD PLACEMENT;  Surgeon: [name], MD;  Location: [place];  Service: Thoracic;  Laterality: N/A;  LV LEAD PLACEMENT  ? HERNIA REPAIR    ? INCISIONAL HERNIA REPAIR N/A 05/25/2015  ? Procedure: LAPAROSCOPIC INCISIONAL HERNIA WITH MESH ;  Surgeon: [name], MD;  Location: [place];  Service: General;  Laterality: N/A;  ? INCONTINENCE SURGERY  2000  ? INSERTION OF MESH N/A 05/25/2015  ? Procedure: INSERTION OF MESH;  Surgeon: [name], MD;  Location: [place];  Service: General;  Laterality: N/A;  ? INTRAOPERATIVE TRANSESOPHAGEAL ECHOCARDIOGRAM N/A 09/28/2012  ? Procedure: INTRAOPERATIVE TRANSESOPHAGEAL ECHOCARDIOGRAM;  Surgeon: [name], MD;  Location: [place];  Service: Open Heart Surgery;  Laterality: N/A;  ? LAPAROSCOPIC INCISIONAL / UMBILICAL / VENTRAL HERNIA REPAIR  05/25/2015  ? IHR  ? LEFT HEART CATHETERIZATION WITH CORONARY ANGIOGRAM N/A 07/20/2012  ? Procedure: LEFT HEART CATHETERIZATION WITH CORONARY ANGIOGRAM;  Surgeon: [name], MD;  Location: [facility] CATH LAB;  Service: Cardiovascular;  Laterality: N/A;  ? MASTECTOMY Right 2002  ? MASTECTOMY MODIFIED RADICAL W/ AXILLARY LYMPH NODES W/ OR W/O PECTORALIS MINOR Left 2002  ? MAZE N/A 09/28/2012  ? Procedure: MAZE;  Surgeon: [name], MD;  Location: [place];  Service: Open Heart Surgery;  Laterality: N/A;  ? ORIF WRIST FRACTURE Right 11/08/2017  ? Procedure: OPEN REDUCTION INTERNAL FIXATION (ORIF) WRIST FRACTURE;  Surgeon: [name], MD;  Location: [place];  Service: Orthopedics;  Laterality: Right;  90 mins  ? [place] N/A 04/17/2020  ? Procedure: PPM GENERATOR CHANGEOUT;  Surgeon: [name], MD;  Location: [place] CV LAB;  Service: Cardiovascular;  Laterality: N/A;  ? TENDON REPAIR Right 2001 X 3-4  ? torn ligaments and tendons in ankle up to knee from work related accident  ? TUBAL LIGATION  1987  ? ? ?Social History: ? reports that she quit smoking about 9 years ago. Her smoking use included cigarettes. She has a 10.00 pack-year smoking history. She has never used smokeless tobacco. She reports that she does not drink alcohol and does not use drugs. ? ?Allergies: ?Allergies  ?Allergen Reactions  ? Statins Other (See Comments)  ?  Myalgias with rosuvastatin, atorvastatin and simvastatin   ? ? ?Family History:  ?Family History  ?Problem Relation Age of Onset  ? Kidney failure Mother   ? Heart disease Mother   ?     Died mid 50P complications of diabetes  ? Diabetes Mother   ? Colon cancer Sister   ? Arthritis Other   ? Diabetes Other   ? Hyperlipidemia Other   ? Ovarian cancer Other   ?  Liver cancer Brother   ? Stomach cancer Neg Hx   ? Rectal cancer Neg Hx   ? Esophageal cancer Neg Hx   ? Pancreatic cancer Neg Hx   ? ? ? ?Current Outpatient Medications:  ?  APPLE CIDER VINEGAR PO, Take 5,220 mg by mouth in the morning, at noon, and at bedtime. 1740 mg each, Disp: , Rfl:  ?  Blood Glucose Monitoring Suppl (TRUE METRIX AIR GLUCOSE METER) DEVI, Check blood sugar 2 times a day, Disp: 1 each, Rfl: 0 ?  carvedilol (COREG) 12.5 MG tablet, TAKE 1 TABLET (12.5 MG) BY MOUTH 2 (TWO) TIMES DAILY WITH A MEAL., Disp: 180 tablet, Rfl: 3 ?  Dulaglutide (TRULICITY) 1.5 FA/2.1HY SOPN, INJECT 1.'5MG'$  (1 PEN) UNDER THE SKIN EVERY WEEK, Disp: 6 mL, Rfl: 0 ?  furosemide (LASIX) 20 MG tablet, TAKE 1 TABLET EVERY DAY, Disp: 90 tablet, Rfl: 3 ?  glipiZIDE (GLUCOTROL) 5 MG tablet, TAKE 1 TABLET TWICE DAILY  BEFORE  MEALS, Disp: 180 tablet, Rfl: 0 ?  glucose blood (TRUE METRIX BLOOD GLUCOSE TEST) test strip, Use to check blood sugar 2 times a day., Disp: 200 each, Rfl: 12 ?  isosorbide mononitrate (IMDUR) 60 MG 24 hr tablet, TAKE 1 TABLET EVERY  DAY, Disp: 90 tablet, Rfl: 3 ?  JARDIANCE 25 MG TABS tablet, TAKE 1 TABLET EVERY DAY BEFORE BREAKFAST, Disp: 90 tablet, Rfl: 1 ?  losartan (COZAAR) 25 MG tablet, TAKE 1 TABLET TWICE DAILY (Patient taking diffe

## 2021-07-03 NOTE — Patient Instructions (Signed)
-  Nice seeing you today!! ? ?-Lab work today; will notify you once results are available. ? ?-Will get you set up for a sleep study. ? ?-Call your cardiologist for an appointment. ?

## 2021-07-04 ENCOUNTER — Telehealth: Payer: Self-pay | Admitting: Cardiovascular Disease

## 2021-07-04 ENCOUNTER — Other Ambulatory Visit: Payer: Self-pay | Admitting: Internal Medicine

## 2021-07-04 DIAGNOSIS — Z124 Encounter for screening for malignant neoplasm of cervix: Secondary | ICD-10-CM

## 2021-07-04 NOTE — Progress Notes (Deleted)
Office Visit    Patient Name: [name] Date of Encounter: 07/04/2021  Primary Care Provider:  Isaac Bliss, Rayford Halsted, MD Primary Cardiologist:  [name], MD  Chief Complaint    71 year old female with a history of CAD s/p CABG in 2014, paroxysmal atrial fibrillation on Xarelto, chronic combined systolic and diastolic heart with recovery of EF following CRT-P ([place]), hypertension, hyperlipidemia, an type 2 diabetes who presents for follow-up related to CAD, heart failure, atrial fibrillation, and major fatigue.  Past Medical History    Past Medical History:  Diagnosis Date   Acute on chronic combined systolic and diastolic CHF, NYHA class 3 ([place]) 03/19/2013   AICD (automatic cardioverter/defibrillator) present 01/2013   Biventricular cardiac pacemaker in situ    Allergic rhinitis    Anogenital warts 01/22/2011   Anxiety state, unspecified 05/13/2013   BREAST CANCER, HX OF 09/19/2009   Qualifier: Diagnosis of  By: [name], [name]    CAD (coronary artery disease)    a. 2004: s/p MI in [place]. No PCI->Medical RX;  b. 07/2012 Cath: LM 30-40, LAD 70p, 70/21m D1 80-90p, OM1 small 90p, OM2 large 80-90p, 517m70-80d, RCA 20-30 diff, EF 40%, glob HK.s/p CABG   Cancer of left breast (HCLeland2002   Patient reports left breast cancer diagnosis in 2002 treated with bilateral mastectomy positive lymph nodes with left axillary dissection followed by chemotherapy of unknown type   Cataract    Chronic combined systolic and diastolic CHF, NYHA class 2 (HCMaryville10/31/2014   Diabetes mellitus type II    Exertional dyspnea 08/05/2014   Exertional shortness of breath    Generalized osteoarthrosis, involving multiple sites 08/25/2015   Hyperlipidemia    Hypertension    Incisional hernia, without obstruction or gangrene 05/04/2015   Ischemic cardiomyopathy    a. 07/2012 Echo: EF 35%, Sev inferoseptal HK, mildly dil LA, Peak PASP 595m.   LBBB (left bundle branch block)    a.  intermittent - present during rapid afib 07/2012.   MYOCARDIAL INFARCTION, HX OF 09/19/2009   Qualifier: Diagnosis of  By: TodSherren Mocha, JefJory Ee [facility] ([facility]  Patient reports chronic numbness in the right foot related to previous surgery on the right leg and "nerve damage"   PAF (paroxysmal atrial fibrillation) (HCCElim  a. 07/2012: Amio and xarelto initiated.   Right carotid bruit 08/11/2012   SBO (small bowel obstruction) (HCC)    SOB (shortness of breath) 02/26/2018   Type 2 diabetes mellitus with circulatory disorder, without long-term current use of insulin (HCCGarrison/25/2017   Vitamin D deficiency 02/22/2016   Past Surgical History:  Procedure Laterality Date   ABDOMINAL HYSTERECTOMY  2000   APPENDECTOMY  1974   BI-VENTRICULAR PACEMAKER INSERTION N/A 01/25/2013   Procedure: BI-VENTRICULAR PACEMAKER INSERTION (CRT-P);  Surgeon: GreEvans LanceD;  [facility];  Service: Cardiovascular;  Laterality: N/A;   BREAST BIOPSY Left 2002   CARDIAC CATHETERIZATION     CHOLECYSTECTOMY OPEN  1974   CORONARY ARTERY BYPASS GRAFT N/A 09/28/2012   Procedure: CORONARY ARTERY BYPASS GRAFTING (CABG);  Surgeon: EdwGrace IsaacD;  [facility] [place];  Laterality: N/A;  CABG x four, using left internal mammary artery and left leg greater saphenous vein harvested endoscopically   DILATION AND CURETTAGE OF UTERUS  1983   EPICARDIAL PACING LEAD PLACEMENT N/A 09/28/2012   Procedure: EPICARDIAL PACING LEAD PLACEMENT;  Surgeon: EdwLilia Argue  Servando Snare, MD;  Location: [place];  Service: Thoracic;  Laterality: N/A;  LV LEAD PLACEMENT   HERNIA REPAIR     INCISIONAL HERNIA REPAIR N/A 05/25/2015   Procedure: LAPAROSCOPIC INCISIONAL HERNIA WITH MESH ;  Surgeon: [name], MD;  Location: [place];  Service: General;  Laterality: N/A;   INCONTINENCE SURGERY  2000   INSERTION OF MESH N/A 05/25/2015   Procedure: INSERTION OF MESH;  Surgeon: [name], MD;   Location: [place];  Service: General;  Laterality: N/A;   INTRAOPERATIVE TRANSESOPHAGEAL ECHOCARDIOGRAM N/A 09/28/2012   Procedure: INTRAOPERATIVE TRANSESOPHAGEAL ECHOCARDIOGRAM;  Surgeon: [name], MD;  Location: [place];  Service: Open Heart Surgery;  Laterality: N/A;   LAPAROSCOPIC INCISIONAL / UMBILICAL / VENTRAL HERNIA REPAIR  05/25/2015   IHR   LEFT HEART CATHETERIZATION WITH CORONARY ANGIOGRAM N/A 07/20/2012   Procedure: LEFT HEART CATHETERIZATION WITH CORONARY ANGIOGRAM;  Surgeon: [name], MD;  [facility] C[facility] LAB;  Service: Cardiovascular;  Laterality: N/A;   MASTECTOMY Right 2002   MASTECTOMY MODIFIED RADICAL W/ AXILLARY LYMPH NODES W/ OR W/O PECTORALIS MINOR Left 2002   MAZE N/A 09/28/2012   Procedure: MAZE;  Surgeon: [name], MD;  Location: [place];  Service: Open Heart Surgery;  Laterality: N/A;   ORIF WRIST FRACTURE Right 11/08/2017   Procedure: OPEN REDUCTION INTERNAL FIXATION (ORIF) WRIST FRACTURE;  Surgeon: [name], MD;  Location: [place];  Service: Orthopedics;  Laterality: Right;  90 mins   PPM GENERATOR CHANGEOUT N/A 04/17/2020   Procedure: PPM GENERATOR CHANGEOUT;  Surgeon: [name], MD;  Location: [place] CV LAB;  Service: Cardiovascular;  Laterality: N/A;   TENDON REPAIR Right 2001 X 3-4   torn ligaments and tendons in ankle up to knee from work related accident   TUBAL LIGATION  1987    Allergies  Allergies  Allergen Reactions   Statins Other (See Comments)    Myalgias with rosuvastatin, atorvastatin and simvastatin     History of Present Illness    71 year old female with the above past medical history including CAD s/p CABG in 2014, paroxysmal atrial fibrillation on Xarelto, chronic combined systolic and diastolic heart with recovery of EF following CRT-P ([place]), hypertension, hyperlipidemia, an type 2 diabetes.  She has a history of MI in 2004.  She was hospitalized in 2014 with congestive heart failure, atrial  fibrillation in the setting of three-vessel CAD.  She underwent CABG x x4 (LIMA-LAD, SVG-RI, sequential SVG-OM1 and OM2) as well as surgical MAZE procedure in July 2014.  Echo at the time showed EF 35%, severe hypokinesis of the inferoseptal myocardium.  Follow-up echocardiogram in October 2014 showed no improvement in EF. She underwent CRT-P insertion in November 2014 in the setting of chronic combined systolic and diastolic heart failure.  Most recent echo in February 2016 showed 50 to 55%, G1 DD.  Lexiscan Myoview in February 2021 showed small apical scar, and possibly new small area of mild inferolateral ischemia, overall was considered low risk. She was started on Imdur.  Carotid Dopplers in February 2021 showed 1 to 39% B ICA stenosis. She underwent CRT-P generator change in February 2022.  She was last seen in the office on 07/31/2020 and was doing well from a cardiac standpoint.  She was cleared for umbilical hernia surgery.  Her PCP on 07/03/2021 and reported a several month history of worsening fatigue, particularly worse over the past 2 to 3 weeks.  She reported difficulty eating up in the mornings, feeling  cold, and not sleeping well at night.  Vitamin B12, TSH, BMP, and CBC were unremarkable.  Her vitamin D level was low, this was supplemented.  She was referred for a sleep study and advised to follow-up with cardiology.  She presents today for follow-up.  Since her last visit  Fatigue: Has been referred for sleep study. CAD: Paroxysmal atrial fibrillation: Chronic combined systolic and diastolic heart failure: Hypertension: Hyperlipidemia: Type 2 diabetes: Disposition:  Home Medications    Current Outpatient Medications  Medication Sig Dispense Refill   APPLE CIDER VINEGAR PO Take 5,220 mg by mouth in the morning, at noon, and at bedtime. 1740 mg each     Blood Glucose Monitoring Suppl (TRUE METRIX AIR GLUCOSE METER) DEVI Check blood sugar 2 times a day 1 each 0   carvedilol (COREG)  12.5 MG tablet TAKE 1 TABLET (12.5 MG) BY MOUTH 2 (TWO) TIMES DAILY WITH A MEAL. 180 tablet 3   Dulaglutide (TRULICITY) 1.5 EL/3.8BO SOPN INJECT 1.'5MG'$  (1 PEN) UNDER THE SKIN EVERY WEEK 6 mL 0   furosemide (LASIX) 20 MG tablet TAKE 1 TABLET EVERY DAY 90 tablet 3   glipiZIDE (GLUCOTROL) 5 MG tablet TAKE 1 TABLET TWICE DAILY  BEFORE  MEALS 180 tablet 0   glucose blood (TRUE METRIX BLOOD GLUCOSE TEST) test strip Use to check blood sugar 2 times a day. 200 each 12   isosorbide mononitrate (IMDUR) 60 MG 24 hr tablet TAKE 1 TABLET EVERY DAY 90 tablet 3   JARDIANCE 25 MG TABS tablet TAKE 1 TABLET EVERY DAY BEFORE BREAKFAST 90 tablet 1   losartan (COZAAR) 25 MG tablet TAKE 1 TABLET TWICE DAILY (Patient taking differently: Take 25 mg by mouth 2 (two) times daily.) 180 tablet 3   losartan (COZAAR) 25 MG tablet Take by mouth.     losartan (COZAAR) 25 MG tablet TAKE 1 TABLET TWICE DAILY 180 tablet 0   losartan (COZAAR) 25 MG tablet TAKE 1 TABLET TWICE DAILY 180 tablet 0   metFORMIN (GLUCOPHAGE) 1000 MG tablet TAKE ONE TABLET BY MOUTH TWICE A DAY WITH FOOD (Patient taking differently: Take 1,000 mg by mouth 2 (two) times daily with a meal.) 200 tablet 4   metFORMIN (GLUCOPHAGE) 1000 MG tablet Take by mouth.     metFORMIN (GLUCOPHAGE) 1000 MG tablet TAKE ONE TABLET BY MOUTH TWICE A DAY WITH FOOD 180 tablet 0   metFORMIN (GLUCOPHAGE) 1000 MG tablet TAKE 1 TABLET TWICE DAILY WITH FOOD 180 tablet 0   montelukast (SINGULAIR) 10 MG tablet Take 1 tablet (10 mg total) by mouth at bedtime. 90 tablet 3   nitroGLYCERIN (NITROSTAT) 0.4 MG SL tablet Place 1 tablet (0.4 mg total) under the tongue every 5 (five) minutes as needed for chest pain. 25 tablet 3   REPATHA SURECLICK 175 MG/ML SOAJ INJECT 140 MG INTO THE SKIN EVERY 14 (FOURTEEN) DAYS. 6 mL 3   TRUEplus Lancets 33G MISC Use to check blood sugar 2 times a day. 200 each 12   Vitamin D, Ergocalciferol, (DRISDOL) 1.25 MG (50000 UNIT) CAPS capsule Take 1 capsule (50,000  Units total) by mouth every 7 (seven) days for 12 doses. 12 capsule 0   XARELTO 20 MG TABS tablet TAKE 1 TABLET (20 MG TOTAL) BY MOUTH DAILY WITH SUPPER. 90 tablet 3   No current facility-administered medications for this visit.     Review of Systems    ***.  All other systems reviewed and are otherwise negative except as noted above.  Physical Exam    VS:  There were no vitals taken for this visit. , BMI There is no height or weight on file to calculate BMI.     GEN: Well nourished, well developed, in no acute distress. HEENT: normal. Neck: Supple, no JVD, carotid bruits, or masses. Cardiac: RRR, no murmurs, rubs, or gallops. No clubbing, cyanosis, edema.  Radials/DP/PT 2+ and equal bilaterally.  Respiratory:  Respirations regular and unlabored, clear to auscultation bilaterally. GI: Soft, nontender, nondistended, BS + x 4. MS: no deformity or atrophy. Skin: warm and dry, no rash. Neuro:  Strength and sensation are intact. Psych: Normal affect.  Accessory Clinical Findings    ECG personally reviewed by me today - *** - no acute changes.  Lab Results  Component Value Date   WBC 8.8 07/03/2021   HGB 13.0 07/03/2021   HCT 38.4 07/03/2021   MCV 89.4 07/03/2021   PLT 234.0 07/03/2021   Lab Results  Component Value Date   CREATININE 0.51 07/03/2021   BUN 9 07/03/2021   NA 139 07/03/2021   K 4.4 07/03/2021   CL 100 07/03/2021   CO2 28 07/03/2021   Lab Results  Component Value Date   ALT 16 07/03/2021   AST 15 07/03/2021   ALKPHOS 47 07/03/2021   BILITOT 0.6 07/03/2021   Lab Results  Component Value Date   CHOL 135 07/31/2020   HDL 61 07/31/2020   LDLCALC 48 07/31/2020   LDLDIRECT 50 04/04/2016   TRIG 157 (H) 07/31/2020   CHOLHDL 2.2 07/31/2020    Lab Results  Component Value Date   HGBA1C 6.6 (A) 07/03/2021    Assessment & Plan    1.  ***   Lenna Sciara, NP 07/04/2021, 3:55 PM

## 2021-07-04 NOTE — Telephone Encounter (Signed)
Patient sent message to scheduling stating to schedule an appt for major fatigue per her PCP. Patient scheduled for tomorrow at 2:15pm with Diona Browner, NP - just FYI ?

## 2021-07-04 NOTE — Telephone Encounter (Signed)
Returned the call to the patient. She stated that her fatigue is gradually increasing. PCP suggested she make an appointment. Appointment made on 5/1 with Dr. Sallyanne Kuster.  ?

## 2021-07-05 ENCOUNTER — Ambulatory Visit: Payer: Medicare HMO | Admitting: Nurse Practitioner

## 2021-07-09 ENCOUNTER — Ambulatory Visit (INDEPENDENT_AMBULATORY_CARE_PROVIDER_SITE_OTHER): Payer: Medicare HMO | Admitting: Cardiovascular Disease

## 2021-07-09 ENCOUNTER — Encounter: Payer: Self-pay | Admitting: Cardiovascular Disease

## 2021-07-09 VITALS — BP 108/72 | HR 88 | Ht 64.5 in | Wt 150.4 lb

## 2021-07-09 DIAGNOSIS — I442 Atrioventricular block, complete: Secondary | ICD-10-CM | POA: Diagnosis not present

## 2021-07-09 DIAGNOSIS — D6869 Other thrombophilia: Secondary | ICD-10-CM | POA: Diagnosis not present

## 2021-07-09 DIAGNOSIS — Z950 Presence of cardiac pacemaker: Secondary | ICD-10-CM

## 2021-07-09 DIAGNOSIS — I2510 Atherosclerotic heart disease of native coronary artery without angina pectoris: Secondary | ICD-10-CM

## 2021-07-09 DIAGNOSIS — I5032 Chronic diastolic (congestive) heart failure: Secondary | ICD-10-CM | POA: Diagnosis not present

## 2021-07-09 DIAGNOSIS — E1159 Type 2 diabetes mellitus with other circulatory complications: Secondary | ICD-10-CM | POA: Diagnosis not present

## 2021-07-09 DIAGNOSIS — R5383 Other fatigue: Secondary | ICD-10-CM | POA: Diagnosis not present

## 2021-07-09 DIAGNOSIS — E782 Mixed hyperlipidemia: Secondary | ICD-10-CM | POA: Diagnosis not present

## 2021-07-09 DIAGNOSIS — I10 Essential (primary) hypertension: Secondary | ICD-10-CM

## 2021-07-09 DIAGNOSIS — I480 Paroxysmal atrial fibrillation: Secondary | ICD-10-CM

## 2021-07-09 NOTE — Patient Instructions (Signed)
Medication Instructions:  No changes *If you need a refill on your cardiac medications before your next appointment, please call your pharmacy*   Lab Work: None ordered If you have labs (blood work) drawn today and your tests are completely normal, you will receive your results only by: MyChart Message (if you have MyChart) OR A paper copy in the mail If you have any lab test that is abnormal or we need to change your treatment, we will call you to review the results.   Testing/Procedures: Your physician has requested that you have an echocardiogram. Echocardiography is a painless test that uses sound waves to create images of your heart. It provides your doctor with information about the size and shape of your heart and how well your heart's chambers and valves are working. You may receive an ultrasound enhancing agent through an IV if needed to better visualize your heart during the echo.This procedure takes approximately one hour. There are no restrictions for this procedure. This will take place at the [address], [address].    Follow-Up: At [facility], you and your health needs are our priority.  As part of our continuing mission to provide you with exceptional heart care, we have created designated Provider Care Teams.  These Care Teams include your primary Cardiologist (physician) and Advanced Practice Providers (APPs -  Physician Assistants and Nurse Practitioners) who all work together to provide you with the care you need, when you need it.  We recommend signing up for the patient portal called "MyChart".  Sign up information is provided on this After Visit Summary.  MyChart is used to connect with patients for Virtual Visits (Telemedicine).  Patients are able to view lab/test results, encounter notes, upcoming appointments, etc.  Non-urgent messages can be sent to your provider as well.   To learn more about what you can do with MyChart, go to [email or web address].     Your next appointment:   12 month(s)  The format for your next appointment:   In Person  Provider:   Mihai Croitoru, MD {   Important Information About Sugar        

## 2021-07-09 NOTE — Progress Notes (Signed)
?Cardiology Office Note:   ? ?Date:  07/10/2021  ? ?ID:  [name], DOB Nov 11, 1950, MRN [id number] ? ?PCP:  Isaac Bliss, Rayford Halsted, MD  ?Cardiologist:  [name], MD   ? ?Referring MD: Isaac Bliss, Est[place]*  ? ?Chief Complaint  ?Patient presents with  ? Fatigue  ? CRT-P ? ?History of Present Illness:   ? ?Simranjit Thayer is a 71 y.o. female with a hx of CAD s/p CABG 2014, paroxysmal atrial fibrillation, CHF with recovery of EF after CRT-P (St. Jude), type 2 diabetes mellitus, hypercholesterolemia, hypertension returning for follow-up. ? ?She does not have any complaints of angina or exertional dyspnea. She complains of feeling tired a lot.  She wakes up feeling tired.  Recent hemoglobin and TSH levels were normal.  She does not have any symptoms of depression.  She states that her husband reports that she snores infrequently and has never reported periods of apnea.  She denies palpitations dizziness or syncope.  She has not had lower extremity edema, orthopnea or PND. ? ?Device interrogation shows normal function: She does not require atrial pacing and has a normal heart rate distribution and based on her histograms and has 100% biventricular pacing.  There have been no episodes of high ventricular rates.  She had a single brief episode of paroxysmal atrial fibrillation, the first in many years, about a month ago.  Estimated generator longevity is 5 years.  All lead parameters are stable.  No evidence of fluid overload recently based on thoracic impedance. ? ?A new finding today is that she does not have any native AV conduction.  She has developed complete heart block and is now pacemaker dependent. ? ?She might require another surgery for her periumbilical hernia, performed by a specialist in [place], since she has had many previous abdominal surgeries ? ?Glycemic control has been excellent with hemoglobin A1c 6.6%.  Her most recent LDL cholesterol was 48 (she is due for repeat labs next  month). ? ?Her most recent functional study was a nuclear stress test in February 2021, showing LVEF 51%.  She has not had an echocardiogram since 2016, similar estimation of EF 50-55%. ? ?ECG is nondiagnostic due to biventricular pacing (she had an LBBB before).  Nuclear stress test in February 2021 showed very minor abnormalities: A previously described fixed apical defect, and a possible small area of basal inferolateral ischemia.  EF was 51%.  She has not had repeat cardiac catheterization since her bypass surgery in 2014. ? ?Mrs. Hannula presented with myocardial infarction in 2004. In May 2014 presented with congestive heart failure and atrial fibrillation in the setting of three-vessel coronary artery disease leading to bypass surgery (July 2014, Dr. Servando Snare, LIMA to LAD, SVG to ramus intermedius, sequential SVG to OM1 and OM 2, surgical maze procedure). She had persistent depressed left ventricular systolic function and received a dual-chamber biventricular pacemaker (St. Jude Allure, Dr. Lovena Le in November 2014).   Last echo February 2016 shows recovery of left ventricular ejection fraction 50-55%. In January 2015, she was admitted with acute on chronic heart failure due to excessive intravenous fluid administration during an episode of small bowel obstruction.Marland Kitchen ?She has type 2 diabetes mellitus that has been difficult to control as well as hypertension and hyperlipidemia. ? ?Nuclear stress test 04/28/2019 ?The left ventricular ejection fraction is mildly decreased (45-54%). ?Nuclear stress EF: 51%. ?There was no ST segment deviation noted during stress. ?No T wave inversion was noted during stress. ?Defect 1: There is a small defect  of mild severity present in the apex location. ?Defect 2: There is a small defect of mild severity present in the basal inferolateral location. ?This is a low risk study. ?Findings consistent with prior myocardial infarction. ?  ?Low risk nuclear scan with a small apical scar  (previously described) and a possibly new small area of mild inferolateral ischemia (sum difference 4%) and borderline reduction in left ventricular systolic function (UY40%). ? ? ?Past Medical History:  ?Diagnosis Date  ? Acute on chronic combined systolic and diastolic CHF, NYHA class 3 ([place]) 03/19/2013  ? AICD (automatic cardioverter/defibrillator) present 01/2013  ? Biventricular cardiac pacemaker in situ   ? Allergic rhinitis   ? Anogenital warts 01/22/2011  ? Anxiety state, unspecified 05/13/2013  ? BREAST CANCER, HX OF 09/19/2009  ? Qualifier: Diagnosis of  By: [name], [name]   ? CAD (coronary artery disease)   ? a. 2004: s/p MI in [place]. No PCI->Medical RX;  b. 07/2012 Cath: LM 30-40, LAD 70p, 70/31m D1 80-90p, OM1 small 90p, OM2 large 80-90p, 562m70-80d, RCA 20-30 diff, EF 40%, glob HK.s/p CABG  ? [facility]  ? Patient reports left breast cancer diagnosis in 2002 treated with bilateral mastectomy positive lymph nodes with left axillary dissection followed by chemotherapy of unknown type  ? Cataract   ? Chronic combined systolic and diastolic CHF, NYHA class 2 (HCHoltville10/31/2014  ? Diabetes mellitus type II   ? Exertional dyspnea 08/05/2014  ? Exertional shortness of breath   ? Generalized osteoarthrosis, involving multiple sites 08/25/2015  ? Hyperlipidemia   ? Hypertension   ? Incisional hernia, without obstruction or gangrene 05/04/2015  ? Ischemic cardiomyopathy   ? a. 07/2012 Echo: EF 35%, Sev inferoseptal HK, mildly dil LA, Peak PASP 5968m.  ? LBBB (left bundle branch block)   ? a. intermittent - present during rapid afib 07/2012.  ? MYOCARDIAL INFARCTION, HX OF 09/19/2009  ? Qualifier: Diagnosis of  By: TodSherren Mocha, JefJory Ee? Neuromuscular disorder (HCCHar[place] ? Patient reports chronic numbness in the right foot related to previous surgery on the right leg and "nerve damage"  ? PAF (paroxysmal atrial fibrillation) (HCCLakeville ? a. 07/2012: Amio and xarelto initiated.  ? Right carotid bruit  08/11/2012  ? SBO (small bowel obstruction) (HCCMc[place] ? SOB (shortness of breath) 02/26/2018  ? Type 2 diabetes mellitus with circulatory disorder, without long-term current use of insulin (HCCLos Molinos/25/2017  ? Vitamin D deficiency 02/22/2016  ? ? ?Past Surgical History:  ?Procedure Laterality Date  ? ABDOMINAL HYSTERECTOMY  2000  ? APPENDECTOMY  1974  ? BI-VENTRICULAR PACEMAKER INSERTION N/A 01/25/2013  ? Procedure: BI-VENTRICULAR PACEMAKER INSERTION (CRT-P);  Surgeon: GreEvans LanceD;  [facility];  Service: Cardiovascular;  Laterality: N/A;  ? BREAST BIOPSY Left 2002  ? CARDIAC CATHETERIZATION    ? CHOLECYSTECTOMY OPEN  1974  ? CORONARY ARTERY BYPASS GRAFT N/A 09/28/2012  ? Procedure: CORONARY ARTERY BYPASS GRAFTING (CABG);  Surgeon: EdwGrace IsaacD;  [facility];  Laterality: N/A;  CABG x four, using left internal mammary artery and left leg greater saphenous vein harvested endoscopically  ? DILIdeal UTERUS  1983  ? EPICARDIAL PACING LEAD PLACEMENT N/A 09/28/2012  ? Procedure: EPICARDIAL PACING LEAD PLACEMENT;  Surgeon: EdwGrace IsaacD;  [facility];  Laterality: N/A;  LV LEAD PLACEMENT  ? HERNIA  REPAIR    ? INCISIONAL HERNIA REPAIR N/A 05/25/2015  ? Procedure: LAPAROSCOPIC INCISIONAL HERNIA WITH MESH ;  Surgeon: [name], MD;  Location: [place];  Service: General;  Laterality: N/A;  ? INCONTINENCE SURGERY  2000  ? INSERTION OF MESH N/A 05/25/2015  ? Procedure: INSERTION OF MESH;  Surgeon: [name], MD;  Location: [place];  Service: General;  Laterality: N/A;  ? INTRAOPERATIVE TRANSESOPHAGEAL ECHOCARDIOGRAM N/A 09/28/2012  ? Procedure: INTRAOPERATIVE TRANSESOPHAGEAL ECHOCARDIOGRAM;  Surgeon: [name], MD;  Location: [place];  Service: Open Heart Surgery;  Laterality: N/A;  ? LAPAROSCOPIC INCISIONAL / UMBILICAL / VENTRAL HERNIA REPAIR  05/25/2015  ? IHR  ? LEFT HEART CATHETERIZATION WITH CORONARY ANGIOGRAM  N/A 07/20/2012  ? Procedure: LEFT HEART CATHETERIZATION WITH CORONARY ANGIOGRAM;  Surgeon: [name], MD;  [facility] C[facility] LAB;  Service: Cardiovascular;  Laterality: N/A;  ? MASTECTOMY Right 2002  ? MASTECTOMY M

## 2021-07-10 ENCOUNTER — Encounter: Payer: Self-pay | Admitting: Cardiovascular Disease

## 2021-07-11 ENCOUNTER — Ambulatory Visit: Payer: Medicare HMO | Admitting: Physician Assistant

## 2021-07-12 ENCOUNTER — Encounter: Payer: Self-pay | Admitting: Cardiovascular Disease

## 2021-07-12 ENCOUNTER — Other Ambulatory Visit (HOSPITAL_COMMUNITY): Payer: Medicare HMO

## 2021-07-12 ENCOUNTER — Ambulatory Visit: Payer: Medicare HMO | Admitting: Nurse Practitioner

## 2021-07-12 ENCOUNTER — Ambulatory Visit (HOSPITAL_COMMUNITY): Payer: Medicare HMO | Attending: Cardiology

## 2021-07-12 DIAGNOSIS — I5032 Chronic diastolic (congestive) heart failure: Secondary | ICD-10-CM | POA: Diagnosis not present

## 2021-07-12 LAB — ECHOCARDIOGRAM COMPLETE
Area-P 1/2: 3.83 cm2
S' Lateral: 2.8 cm

## 2021-07-13 ENCOUNTER — Telehealth (HOSPITAL_COMMUNITY): Payer: Self-pay | Admitting: Radiology

## 2021-07-13 NOTE — Telephone Encounter (Signed)
I called the patient 07/12/21 to explain the cancellation of her echocardiogram due to double booking. After talking with the patient, I was able to schedule the patient for 07/12/21 '@300'$  pm, to the patient's satisfaction.  ?

## 2021-07-17 ENCOUNTER — Ambulatory Visit (INDEPENDENT_AMBULATORY_CARE_PROVIDER_SITE_OTHER): Payer: Medicare HMO

## 2021-07-17 DIAGNOSIS — I5032 Chronic diastolic (congestive) heart failure: Secondary | ICD-10-CM | POA: Diagnosis not present

## 2021-07-17 DIAGNOSIS — I442 Atrioventricular block, complete: Secondary | ICD-10-CM

## 2021-07-17 LAB — CUP PACEART REMOTE DEVICE CHECK
Battery Remaining Longevity: 67 mo
Battery Remaining Percentage: 82 %
Battery Voltage: 2.99 V
Brady Statistic AP VP Percent: 1 %
Brady Statistic AP VS Percent: 0 %
Brady Statistic AS VP Percent: 99 %
Brady Statistic AS VS Percent: 1 %
Brady Statistic RA Percent Paced: 1 %
Date Time Interrogation Session: 20230508070056
Implantable Lead Implant Date: 20140721
Implantable Lead Implant Date: 20141117
Implantable Lead Implant Date: 20141117
Implantable Lead Location: 753858
Implantable Lead Location: 753859
Implantable Lead Location: 753860
Implantable Lead Model: 5071
Implantable Pulse Generator Implant Date: 20220207
Lead Channel Impedance Value: 400 Ohm
Lead Channel Impedance Value: 400 Ohm
Lead Channel Impedance Value: 540 Ohm
Lead Channel Pacing Threshold Amplitude: 0.5 V
Lead Channel Pacing Threshold Amplitude: 0.75 V
Lead Channel Pacing Threshold Amplitude: 1.25 V
Lead Channel Pacing Threshold Pulse Width: 0.4 ms
Lead Channel Pacing Threshold Pulse Width: 0.4 ms
Lead Channel Pacing Threshold Pulse Width: 0.5 ms
Lead Channel Sensing Intrinsic Amplitude: 0.4 mV
Lead Channel Sensing Intrinsic Amplitude: 12 mV
Lead Channel Setting Pacing Amplitude: 2 V
Lead Channel Setting Pacing Amplitude: 2.5 V
Lead Channel Setting Pacing Amplitude: 2.5 V
Lead Channel Setting Pacing Pulse Width: 0.4 ms
Lead Channel Setting Pacing Pulse Width: 0.5 ms
Lead Channel Setting Sensing Sensitivity: 2 mV
Pulse Gen Model: 3222
Pulse Gen Serial Number: 3859407

## 2021-07-18 ENCOUNTER — Encounter: Payer: Self-pay | Admitting: *Deleted

## 2021-07-20 ENCOUNTER — Encounter: Payer: Self-pay | Admitting: Gastroenterology

## 2021-07-20 ENCOUNTER — Telehealth: Payer: Self-pay | Admitting: Gastroenterology

## 2021-07-20 ENCOUNTER — Ambulatory Visit: Payer: Medicare HMO | Admitting: Gastroenterology

## 2021-07-20 ENCOUNTER — Telehealth: Payer: Self-pay | Admitting: *Deleted

## 2021-07-20 VITALS — BP 126/70 | HR 88 | Ht 64.4 in | Wt 153.0 lb

## 2021-07-20 DIAGNOSIS — Z951 Presence of aortocoronary bypass graft: Secondary | ICD-10-CM | POA: Diagnosis not present

## 2021-07-20 DIAGNOSIS — Z860100 Personal history of colon polyps, unspecified: Secondary | ICD-10-CM | POA: Insufficient documentation

## 2021-07-20 DIAGNOSIS — Z7901 Long term (current) use of anticoagulants: Secondary | ICD-10-CM

## 2021-07-20 DIAGNOSIS — I480 Paroxysmal atrial fibrillation: Secondary | ICD-10-CM | POA: Diagnosis not present

## 2021-07-20 MED ORDER — PLENVU 140 G PO SOLR: 1.0000 | ORAL | 0 refills | Status: DC

## 2021-07-20 NOTE — Telephone Encounter (Signed)
Patient called stating that plenvu needs prior approval. Please advise ?

## 2021-07-20 NOTE — Telephone Encounter (Signed)
Request for surgical clearance:     Endoscopy Procedure ? ?What type of surgery is being performed?     colonoscopy ? ?When is this surgery scheduled?     07/30/21 ? ?What type of clearance is required ?   Pharmacy ? ?Are there any medications that need to be held prior to surgery and how long? Xarelto, 1 day ? ?Practice name and name of physician performing surgery?      [facility] ? ?What is your office phone and fax number?      Phone- [phone]  Fax- [phone] ? ?Anesthesia type (None, local, MAC, general) ?       MAC ? ?

## 2021-07-20 NOTE — Progress Notes (Signed)
? ? ? ?07/20/2021 ?[name] ?[id number] ?10-28-1950 ? ? ?HISTORY OF PRESENT ILLNESS: This is a 71 year old female who is a patient of Dr. Corena Pilgrim.  She is here today to discuss and schedule colonoscopy. ? ?Last colonoscopy was November 2012 with Dr. Deatra Ina at which time she was found to have internal hemorrhoids, diverticulosis, 2 polyps removed that were tubular adenomas. ? ?She had seen Dr. Loletha Carrow in January 2022 to schedule that, but then something came up and she had to cancel it.  She is on Xarelto for history of atrial fibrillation for which she follows with Dr. Sallyanne Kuster. ? ?She denies any GI issues.  Says she moves her bowels regularly.  No rectal bleeding. ? ? ?Past Medical History:  ?Diagnosis Date  ? Acute on chronic combined systolic and diastolic CHF, NYHA class 3 ([place]) 03/19/2013  ? AICD (automatic cardioverter/defibrillator) present 01/2013  ? Biventricular cardiac pacemaker in situ   ? Allergic rhinitis   ? Anogenital warts 01/22/2011  ? Anxiety state, unspecified 05/13/2013  ? BREAST CANCER, HX OF 09/19/2009  ? Qualifier: Diagnosis of  By: [name], [name]   ? CAD (coronary artery disease)   ? a. 2004: s/p MI in [place]. No PCI->Medical RX;  b. 07/2012 Cath: LM 30-40, LAD 70p, 70/59m D1 80-90p, OM1 small 90p, OM2 large 80-90p, 526m70-80d, RCA 20-30 diff, EF 40%, glob HK.s/p CABG  ? [facility]  ? Patient reports left breast cancer diagnosis in 2002 treated with bilateral mastectomy positive lymph nodes with left axillary dissection followed by chemotherapy of unknown type  ? Cataract   ? Chronic combined systolic and diastolic CHF, NYHA class 2 (HCPikeville10/31/2014  ? Diabetes mellitus type II   ? Exertional dyspnea 08/05/2014  ? Exertional shortness of breath   ? Generalized osteoarthrosis, involving multiple sites 08/25/2015  ? History of colon polyps   ? Hyperlipidemia   ? Hypertension   ? Incisional hernia, without obstruction or gangrene 05/04/2015  ? Ischemic  cardiomyopathy   ? a. 07/2012 Echo: EF 35%, Sev inferoseptal HK, mildly dil LA, Peak PASP 592m.  ? LBBB (left bundle branch block)   ? a. intermittent - present during rapid afib 07/2012.  ? MYOCARDIAL INFARCTION, HX OF 09/19/2009  ? Qualifier: Diagnosis of  By: TodSherren Mocha, JefJory Ee? Neuromuscular disorder (HCCSt[place] ? Patient reports chronic numbness in the right foot related to previous surgery on the right leg and "nerve damage"  ? PAF (paroxysmal atrial fibrillation) (HCCSpringdale ? a. 07/2012: Amio and xarelto initiated.  ? Right carotid bruit 08/11/2012  ? SBO (small bowel obstruction) (HCCEll[place] ? SOB (shortness of breath) 02/26/2018  ? Type 2 diabetes mellitus with circulatory disorder, without long-term current use of insulin (HCCWestwego7/25/2017  ? Vitamin D deficiency 02/22/2016  ? ?Past Surgical History:  ?Procedure Laterality Date  ? ABDOMINAL HYSTERECTOMY  2000  ? APPENDECTOMY  1974  ? BI-VENTRICULAR PACEMAKER INSERTION N/A 01/25/2013  ? Procedure: BI-VENTRICULAR PACEMAKER INSERTION (CRT-P);  Surgeon: GreEvans LanceD;  [facility];  Service: Cardiovascular;  Laterality: N/A;  ? BREAST BIOPSY Left 2002  ? CARDIAC CATHETERIZATION    ? CHOLECYSTECTOMY OPEN  1974  ? CORONARY ARTERY BYPASS GRAFT N/A 09/28/2012  ? Procedure: CORONARY ARTERY BYPASS GRAFTING (CABG);  Surgeon: EdwGrace IsaacD;  [facility];  Laterality: N/A;  CABG x four, using left internal mammary artery and  left leg greater saphenous vein harvested endoscopically  ? [place] OF UTERUS  1983  ? EPICARDIAL PACING LEAD PLACEMENT N/A 09/28/2012  ? Procedure: EPICARDIAL PACING LEAD PLACEMENT;  Surgeon: [name], MD;  Location: [place];  Service: Thoracic;  Laterality: N/A;  LV LEAD PLACEMENT  ? HERNIA REPAIR    ? INCISIONAL HERNIA REPAIR N/A 05/25/2015  ? Procedure: LAPAROSCOPIC INCISIONAL HERNIA WITH MESH ;  Surgeon: [name], MD;  Location: [place];  Service: General;  Laterality:  N/A;  ? INCONTINENCE SURGERY  2000  ? INSERTION OF MESH N/A 05/25/2015  ? Procedure: INSERTION OF MESH;  Surgeon: [name], MD;  Location: [place];  Service: General;  Laterality: N/A;  ? INTRAOPERATIVE TRANSESOPHAGEAL ECHOCARDIOGRAM N/A 09/28/2012  ? Procedure: INTRAOPERATIVE TRANSESOPHAGEAL ECHOCARDIOGRAM;  Surgeon: [name], MD;  Location: [place];  Service: Open Heart Surgery;  Laterality: N/A;  ? LAPAROSCOPIC INCISIONAL / UMBILICAL / VENTRAL HERNIA REPAIR  05/25/2015  ? IHR  ? LEFT HEART CATHETERIZATION WITH CORONARY ANGIOGRAM N/A 07/20/2012  ? Procedure: LEFT HEART CATHETERIZATION WITH CORONARY ANGIOGRAM;  Surgeon: [name], MD;  [facility] C[facility] LAB;  Service: Cardiovascular;  Laterality: N/A;  ? MASTECTOMY Right 2002  ? MASTECTOMY MODIFIED RADICAL W/ AXILLARY LYMPH NODES W/ OR W/O PECTORALIS MINOR Left 2002  ? MAZE N/A 09/28/2012  ? Procedure: MAZE;  Surgeon: [name], MD;  Location: [place];  Service: Open Heart Surgery;  Laterality: N/A;  ? ORIF WRIST FRACTURE Right 11/08/2017  ? Procedure: OPEN REDUCTION INTERNAL FIXATION (ORIF) WRIST FRACTURE;  Surgeon: [name], MD;  Location: [place];  Service: Orthopedics;  Laterality: Right;  90 mins  ? [place] N/A 04/17/2020  ? Procedure: PPM GENERATOR CHANGEOUT;  Surgeon: [name], MD;  Location: [place] CV LAB;  Service: Cardiovascular;  Laterality: N/A;  ? TENDON REPAIR Right 2001 X 3-4  ? torn ligaments and tendons in ankle up to knee from work related accident  ? TUBAL LIGATION  1987  ? ? reports that she quit smoking about 9 years ago. Her smoking use included cigarettes. She has a 10.00 pack-year smoking history. She has never used smokeless tobacco. She reports that she does not drink alcohol and does not use drugs. ?family history includes Arthritis in an other family member; Colon cancer in her sister; Diabetes in her mother and another family member; Heart disease in her mother; Hyperlipidemia in an  other family member; Kidney failure in her mother; Liver cancer in her brother; Ovarian cancer in an other family member. ?Allergies  ?Allergen Reactions  ? Statins Other (See Comments)  ?  Myalgias with rosuvastatin, atorvastatin and simvastatin   ? ? ?  ?Outpatient Encounter Medications as of 07/20/2021  ?Medication Sig  ? APPLE CIDER VINEGAR PO Take 5,220 mg by mouth in the morning, at noon, and at bedtime. 1740 mg each  ? Blood Glucose Monitoring Suppl (TRUE METRIX AIR GLUCOSE METER) DEVI Check blood sugar 2 times a day  ? carvedilol (COREG) 12.5 MG tablet TAKE 1 TABLET (12.5 MG) BY MOUTH 2 (TWO) TIMES DAILY WITH A MEAL.  ? Dulaglutide (TRULICITY) 1.5 QI/2.9NL SOPN INJECT 1.'5MG'$  (1 PEN) UNDER THE SKIN EVERY WEEK  ? furosemide (LASIX) 20 MG tablet TAKE 1 TABLET EVERY DAY  ? glipiZIDE (GLUCOTROL) 5 MG tablet TAKE 1 TABLET TWICE DAILY  BEFORE  MEALS  ? glucose blood (TRUE METRIX BLOOD GLUCOSE TEST) test strip Use to check blood sugar 2 times a  day.  ? isosorbide mononitrate (IMDUR) 60 MG 24 hr tablet TAKE 1 TABLET EVERY DAY  ? JARDIANCE 25 MG TABS tablet TAKE 1 TABLET EVERY DAY BEFORE BREAKFAST  ? losartan (COZAAR) 25 MG tablet TAKE 1 TABLET TWICE DAILY (Patient taking differently: Take 25 mg by mouth 2 (two) times daily.)  ? metFORMIN (GLUCOPHAGE) 1000 MG tablet TAKE ONE TABLET BY MOUTH TWICE A DAY WITH FOOD (Patient taking differently: Take 1,000 mg by mouth 2 (two) times daily with a meal.)  ? nitroGLYCERIN (NITROSTAT) 0.4 MG SL tablet Place 1 tablet (0.4 mg total) under the tongue every 5 (five) minutes as needed for chest pain.  ? REPATHA SURECLICK 759 MG/ML SOAJ INJECT 140 MG INTO THE SKIN EVERY 14 (FOURTEEN) DAYS.  ? TRUEplus Lancets 33G MISC Use to check blood sugar 2 times a day.  ? Vitamin D, Ergocalciferol, (DRISDOL) 1.25 MG (50000 UNIT) CAPS capsule Take 1 capsule (50,000 Units total) by mouth every 7 (seven) days for 12 doses.  ? XARELTO 20 MG TABS tablet TAKE 1 TABLET (20 MG TOTAL) BY MOUTH DAILY WITH  SUPPER.  ? [DISCONTINUED] montelukast (SINGULAIR) 10 MG tablet Take 1 tablet (10 mg total) by mouth at bedtime.  ? ?No facility-administered encounter medications on file as of 07/20/2021.  ? ? ? ?REVIEW OF SYSTEMS  : Al

## 2021-07-20 NOTE — Patient Instructions (Signed)
You have been scheduled for a colonoscopy. Please follow written instructions given to you at your visit today. ?If you use inhalers (even only as needed), please bring them with you on the day of your procedure. ? ?If you are age 70 or older, your body mass index should be between 23-30. Your Body mass index is 25.94 kg/m?Marland Kitchen If this is out of the aforementioned range listed, please consider follow up with your Primary Care Provider. ? ?If you are age 73 or younger, your body mass index should be between 19-25. Your Body mass index is 25.94 kg/m?Marland Kitchen If this is out of the aformentioned range listed, please consider follow up with your Primary Care Provider.  ? ?________________________________________________________ ? ?The [place] [facility] providers would like to encourage you to use [facility] to communicate with providers for non-urgent requests or questions.  Due to long hold times on the telephone, sending your provider a message by [facility] may be a faster and more efficient way to get a response.  Please allow 48 business hours for a response.  Please remember that this is for non-urgent requests.  ?_______________________________________________________ ?Due to recent changes in healthcare laws, you may see the results of your imaging and laboratory studies on MyChart before your provider has had a chance to review them.  We understand that in some cases there may be results that are confusing or concerning to you. Not all laboratory results come back in the same time frame and the provider may be waiting for multiple results in order to interpret others.  Please give [place] 48 hours in order for your provider to thoroughly review all the results before contacting the office for clarification of your results.  ? ?

## 2021-07-20 NOTE — Telephone Encounter (Signed)
Contacted pharmacy and gave a medicare coupon. This places the medication at a 60 dollar copay. ?

## 2021-07-20 NOTE — Telephone Encounter (Signed)
Patient with diagnosis of afib on Xarelto for anticoagulation.   ? ?Procedure: colonoscopy ?Date of procedure: 07/30/21 ? ?CHA2DS2-VASc Score = 6  ?This indicates a 9.7% annual risk of stroke. ?The patient's score is based upon: ?CHF History: 1 ?HTN History: 1 ?Diabetes History: 1 ?Stroke History: 0 ?Vascular Disease History: 1 ?Age Score: 1 ?Gender Score: 1 ? ?CrCl >144m/min ?Platelet count 234K ? ?Per office protocol, patient can hold Xarelto for 1 day prior to procedure.   ?

## 2021-07-21 NOTE — Progress Notes (Signed)
____________________________________________________________ ? ?Attending physician addendum: ? ?Thank you for sending this case to me. ?I have reviewed the entire note and agree with the plan. ? ?Incidentally, recent cardiac echo with nml LVEF and no significant valvular disease.  So OK for procedure in the [place]. ? ?Wilfrid Lund, MD ? ?____________________________________________________________ ? ?

## 2021-07-23 ENCOUNTER — Encounter: Payer: Self-pay | Admitting: Gastroenterology

## 2021-07-23 NOTE — Telephone Encounter (Signed)
? ?  Name: [name]  ?DOB: 09/29/1950  ?MRN: [id number]  ? ?Primary Cardiologist: [name], MD ? ?Chart reviewed as part of pre-operative protocol coverage. Patient was contacted 07/23/2021 in reference to pre-operative risk assessment for pending surgery as outlined below.  [name] was last seen on 07/11/2021 by Dr. Sallyanne Kuster.  Since that day, [name] has done well from a cardiac standpoint. She denies any new or concerning symptoms. ? ?Therefore, based on ACC/AHA guidelines, the patient would be at acceptable risk for the planned procedure without further cardiovascular testing.  ? ?The patient was advised that if she develops new symptoms prior to surgery to contact our office to arrange for a follow-up visit, and she verbalized understanding. ? ?Patient with diagnosis of afib on Xarelto for anticoagulation.   ?  ?Procedure: colonoscopy ?Date of procedure: 07/30/21 ?  ?CHA2DS2-VASc Score = 6  ?This indicates a 9.7% annual risk of stroke. ?The patient's score is based upon: ?CHF History: 1 ?HTN History: 1 ?Diabetes History: 1 ?Stroke History: 0 ?Vascular Disease History: 1 ?Age Score: 1 ?Gender Score: 1 ?  ?CrCl >141m/min ?Platelet count 234K ?  ?Per office protocol, patient can hold Xarelto for 1 day prior to procedure. Please resume Xarelto as soon as possible postprocedure, at the discretion of the surgeon.  ? ?I will route this recommendation to the requesting party via Epic fax function and remove from pre-op pool. Please call with questions. ? ?ELenna Sciara NP ?07/23/2021, 11:51 AM ? ?

## 2021-07-23 NOTE — Telephone Encounter (Signed)
I have spoken to patient to advise that per cardiology, she may hold xarelto 1 day prior to her upcoming procedure. Patient verbalizes understanding of this information. ?

## 2021-07-25 ENCOUNTER — Other Ambulatory Visit (HOSPITAL_COMMUNITY): Payer: Medicare HMO

## 2021-07-30 ENCOUNTER — Ambulatory Visit (AMBULATORY_SURGERY_CENTER): Payer: Medicare HMO | Admitting: Gastroenterology

## 2021-07-30 ENCOUNTER — Encounter: Payer: Self-pay | Admitting: Gastroenterology

## 2021-07-30 ENCOUNTER — Other Ambulatory Visit: Payer: Self-pay | Admitting: Gastroenterology

## 2021-07-30 VITALS — BP 106/68 | HR 89 | Temp 96.2°F | Resp 18 | Ht 64.0 in | Wt 153.0 lb

## 2021-07-30 DIAGNOSIS — I2510 Atherosclerotic heart disease of native coronary artery without angina pectoris: Secondary | ICD-10-CM | POA: Diagnosis not present

## 2021-07-30 DIAGNOSIS — K5140 Inflammatory polyps of colon without complications: Secondary | ICD-10-CM

## 2021-07-30 DIAGNOSIS — I10 Essential (primary) hypertension: Secondary | ICD-10-CM | POA: Diagnosis not present

## 2021-07-30 DIAGNOSIS — D122 Benign neoplasm of ascending colon: Secondary | ICD-10-CM | POA: Diagnosis not present

## 2021-07-30 DIAGNOSIS — D123 Benign neoplasm of transverse colon: Secondary | ICD-10-CM | POA: Diagnosis not present

## 2021-07-30 DIAGNOSIS — Z86010 Personal history of colonic polyps: Secondary | ICD-10-CM

## 2021-07-30 MED ORDER — SODIUM CHLORIDE 0.9 % IV SOLN: 500.0000 mL | Freq: Once | INTRAVENOUS | Status: DC

## 2021-07-30 NOTE — Progress Notes (Signed)
Report to PACU, RN, vss, BBS= Clear.  

## 2021-07-30 NOTE — Progress Notes (Signed)
Remote pacemaker transmission.   

## 2021-07-30 NOTE — Patient Instructions (Addendum)
Handouts were given to your care partner on polyps, diverticulosis, and a high fiber diet with liberal fluid intake Resume your XARELTO at prior dose TOMORROW, Jul 31, 2021. Your sugar was 174 in the recovery room. You may resume your current medications today. Await biopsy results.  May take 1-3 weeks to receive pathology results. Please call if any questions or concerns.   YOU HAD AN ENDOSCOPIC PROCEDURE TODAY AT [facility]:   Refer to the procedure report that was given to you for any specific questions about what was found during the examination.  If the procedure report does not answer your questions, please call your gastroenterologist to clarify.  If you requested that your care partner not be given the details of your procedure findings, then the procedure report has been included in a sealed envelope for you to review at your convenience later.  YOU SHOULD EXPECT: Some feelings of bloating in the abdomen. Passage of more gas than usual.  Walking can help get rid of the air that was put into your GI tract during the procedure and reduce the bloating. If you had a lower endoscopy (such as a colonoscopy or flexible sigmoidoscopy) you may notice spotting of blood in your stool or on the toilet paper. If you underwent a bowel prep for your procedure, you may not have a normal bowel movement for a few days.  Please Note:  You might notice some irritation and congestion in your nose or some drainage.  This is from the oxygen used during your procedure.  There is no need for concern and it should clear up in a day or so.  SYMPTOMS TO REPORT IMMEDIATELY:  Following lower endoscopy (colonoscopy or flexible sigmoidoscopy):  Excessive amounts of blood in the stool  Significant tenderness or worsening of abdominal pains  Swelling of the abdomen that is new, acute  Fever of 100F or higher   For urgent or emergent issues, a gastroenterologist can be reached at any hour by calling  [phone]. Do not use MyChart messaging for urgent concerns.    DIET:  We do recommend a small meal at first, but then you may proceed to your regular diet.  Drink plenty of fluids but you should avoid alcoholic beverages for 24 hours.  ACTIVITY:  You should plan to take it easy for the rest of today and you should NOT DRIVE or use heavy machinery until tomorrow (because of the sedation medicines used during the test).    FOLLOW UP: Our staff will call the number listed on your records 48-72 hours following your procedure to check on you and address any questions or concerns that you may have regarding the information given to you following your procedure. If we do not reach you, we will leave a message.  We will attempt to reach you two times.  During this call, we will ask if you have developed any symptoms of COVID 19. If you develop any symptoms (ie: fever, flu-like symptoms, shortness of breath, cough etc.) before then, please call [phone].  If you test positive for Covid 19 in the 2 weeks post procedure, please call and report this information to [place].    If any biopsies were taken you will be contacted by phone or by letter within the next 1-3 weeks.  Please call us at [phone] if you have not heard about the biopsies in 3 weeks.    SIGNATURES/CONFIDENTIALITY: You and/or your care partner have signed paperwork which will  be entered into your electronic medical record.  These signatures attest to the fact that that the information above on your After Visit Summary has been reviewed and is understood.  Full responsibility of the confidentiality of this discharge information lies with you and/or your care-partner.

## 2021-07-30 NOTE — Progress Notes (Signed)
Called to room to assist during endoscopic procedure.  Patient ID and intended procedure confirmed with present staff. Received instructions for my participation in the procedure from the performing physician.  

## 2021-07-30 NOTE — Progress Notes (Signed)
No changes to clinical history since GI office visit on 07/20/21. Patient has been off Xarelto for procedure.  The patient is appropriate for an endoscopic procedure in the ambulatory setting.  - Wilfrid Lund, MD

## 2021-07-30 NOTE — Op Note (Signed)
[facility] Patient Name: [name] Procedure Date: 07/30/2021 10:00 AM MRN: [id number] Endoscopist: Sherilyn Cooter L. Myrtie Neither , MD Age: 71 Referring MD:  Date of Birth: May 17, 1950 Gender: Female Account #: [id number] Procedure:                Colonoscopy Indications:              Surveillance: Personal history of adenomatous                            polyps on last colonoscopy > 5 years ago, (TA <                            10mm x 2 in 2012) Medicines:                Monitored Anesthesia Care Procedure:                Pre-Anesthesia Assessment:                           - Prior to the procedure, a History and Physical                            was performed, and patient medications and                            allergies were reviewed. The patient's tolerance of                            previous anesthesia was also reviewed. The risks                            and benefits of the procedure and the sedation                            options and risks were discussed with the patient.                            All questions were answered, and informed consent                            was obtained. Prior Anticoagulants: The patient has                            taken Xarelto (rivaroxaban), last dose was 2 days                            prior to procedure. ASA Grade Assessment: III - A                            patient with severe systemic disease. After                            reviewing the risks and benefits, the patient was  deemed in satisfactory condition to undergo the                            procedure.                           After obtaining informed consent, the colonoscope                            was passed under direct vision. Throughout the                            procedure, the patient's blood pressure, pulse, and                            oxygen saturations were monitored continuously. The                             CF [id number] #[id number] was introduced through the anus                            and advanced to the the cecum, identified by                            appendiceal orifice and ileocecal valve. The                            colonoscopy was somewhat difficult due to a                            redundant colon. Successful completion of the                            procedure was aided by using manual pressure and                            straightening and shortening the scope to obtain                            bowel loop reduction. The patient tolerated the                            procedure well. The quality of the bowel                            preparation was good. The ileocecal valve,                            appendiceal orifice, and rectum were photographed. Scope In: 10:05:34 AM Scope Out: 10:28:06 AM Scope Withdrawal Time: 0 hours 18 minutes 47 seconds  Total Procedure Duration: 0 hours 22 minutes 32 seconds  Findings:                 The perianal and digital rectal examinations were  normal.                           Repeat examination of right colon under NBI                            performed.                           A 5 mm polyp was found in the proximal ascending                            colon. The polyp was sessile. The polyp was removed                            with a cold snare. Resection and retrieval were                            complete. (Jar 1)                           Two flat polyps were found in the transverse colon                            and ascending colon. The polyps were 10 to 12 mm in                            size. These polyps were removed with a piecemeal                            technique using a cold snare. Resection and                            retrieval were complete. (Jar 2)                           Multiple diverticula were found in the left colon                            and right colon.                            The exam was otherwise without abnormality on                            direct and retroflexion views. Complications:            No immediate complications. Estimated Blood Loss:     Estimated blood loss was minimal. Impression:               - One 5 mm polyp in the proximal ascending colon,                            removed with a cold snare. Resected and retrieved.                           -  Two 10 to 12 mm polyps in the transverse colon                            and in the ascending colon, removed piecemeal using                            a cold snare. Resected and retrieved.                           - Diverticulosis in the left colon and in the right                            colon.                           - The examination was otherwise normal on direct                            and retroflexion views. Recommendation:           - Patient has a contact number available for                            emergencies. The signs and symptoms of potential                            delayed complications were discussed with the                            patient. Return to normal activities tomorrow.                            Written discharge instructions were provided to the                            patient.                           - Resume previous diet.                           - Continue present medications.                           - Resume Xarelto (rivaroxaban) at prior dose                            tomorrow.                           - Await pathology results.                           - Repeat colonoscopy is recommended for                            surveillance. The colonoscopy  date will be                            determined after pathology results from today's                            exam become available for review. Deema Juncaj L. Myrtie Neither, MD 07/30/2021 10:35:09 AM This report has been signed electronically.

## 2021-07-30 NOTE — Progress Notes (Signed)
Pt and her husband were instructed to restart XARELTO tomorrow at the prior dose per Dr. Loletha Carrow.  No problems noted in the recovery room. maw

## 2021-07-31 ENCOUNTER — Telehealth: Payer: Self-pay | Admitting: *Deleted

## 2021-07-31 NOTE — Telephone Encounter (Signed)
  Follow up Call-     07/30/2021    9:13 AM  Call back number  Post procedure Call Back phone  # [phone]  Permission to leave phone message Yes     Patient questions:  Do you have a fever, pain , or abdominal swelling? No. Pain Score  0 *  Have you tolerated food without any problems? Yes.    Have you been able to return to your normal activities? Yes.    Do you have any questions about your discharge instructions: Diet   No. Medications  No. Follow up visit  No.  Do you have questions or concerns about your Care? No.  Actions: * If pain score is 4 or above: No action needed, pain <4.

## 2021-08-02 ENCOUNTER — Encounter: Payer: Self-pay | Admitting: Gastroenterology

## 2021-08-09 ENCOUNTER — Ambulatory Visit: Payer: Medicare HMO | Admitting: Obstetrics and Gynecology

## 2021-08-30 ENCOUNTER — Ambulatory Visit: Payer: Self-pay

## 2021-08-30 DIAGNOSIS — E1159 Type 2 diabetes mellitus with other circulatory complications: Secondary | ICD-10-CM

## 2021-08-30 NOTE — Patient Instructions (Signed)
Visit Information Case closed unable to maintain contact Thank you for allowing me to share the care management and care coordination services that are available to you as part of your health plan and services through your primary care provider and medical home. Please reach out to me at [phone] if the care management/care coordination team may be of assistance to you in the future.   Molli Gethers RN, BSN,CCM, CDE Care Management Coordinator [facility] [phone]   

## 2021-08-30 NOTE — Chronic Care Management (AMB) (Signed)
  Care Management   Follow Up Note   08/30/2021 Name: Mikel Pyon MRN: [id number] DOB: 12/25/1950   Referred by: Isaac Bliss, Rayford Halsted, MD Reason for referral : Chronic Care Management (Case closed unable to maintain contact)   Case closed unable to maintain contact  Follow Up Plan:  Case closed unable to maintain contact  [place], [facility], CDE Care Management Coordinator [facility] [phone]

## 2021-09-08 ENCOUNTER — Other Ambulatory Visit: Payer: Self-pay | Admitting: Internal Medicine

## 2021-09-08 DIAGNOSIS — E1159 Type 2 diabetes mellitus with other circulatory complications: Secondary | ICD-10-CM

## 2021-09-10 ENCOUNTER — Ambulatory Visit: Payer: Medicare HMO | Admitting: Obstetrics and Gynecology

## 2021-09-13 ENCOUNTER — Telehealth: Payer: Self-pay

## 2021-09-13 NOTE — Telephone Encounter (Signed)
Call back received from Pt.  At time noted she was in [place] on vacation.  She was at a park with her grandchildren.  No symptoms.  Continue to monitor.

## 2021-09-13 NOTE — Telephone Encounter (Signed)
LVM for patient to call device clinic back regarding alert:   Atrial lead noise noted on 09/03/21 between 1:39-1:43 PM. Correlation of source/patient activity needed.

## 2021-09-14 ENCOUNTER — Encounter: Payer: Self-pay | Admitting: Cardiovascular Disease

## 2021-09-14 ENCOUNTER — Telehealth: Payer: Self-pay

## 2021-09-14 NOTE — Telephone Encounter (Signed)
Noise Reversion EGM

## 2021-09-14 NOTE — Telephone Encounter (Signed)
Thanks. Clearly external artifact.

## 2021-09-17 ENCOUNTER — Other Ambulatory Visit: Payer: Self-pay | Admitting: Cardiovascular Disease

## 2021-09-17 ENCOUNTER — Institutional Professional Consult (permissible substitution): Payer: Medicare HMO | Admitting: Neurology

## 2021-09-18 NOTE — Telephone Encounter (Signed)
Thanks

## 2021-09-21 ENCOUNTER — Encounter: Payer: Self-pay | Admitting: Internal Medicine

## 2021-10-01 ENCOUNTER — Encounter: Payer: Self-pay | Admitting: Pharmacist

## 2021-10-01 DIAGNOSIS — G720 Drug-induced myopathy: Secondary | ICD-10-CM

## 2021-10-01 NOTE — Progress Notes (Signed)
[facility])                                            [place] Team                                        Statin Quality Measure Assessment    10/01/2021  [name] 1951/02/06 [id number]  Per review of chart and payor information, patient has a diagnosis of diabetes but is not currently filling a statin prescription.  This places patient into the SUPD (Statin Use In Patients with Diabetes) measure for CMS.    Patient has documented trials of statins with reported muscle pain, but no corresponding CPT codes that would exclude patient from SUPD measure. She has an upcoming appointment on 10/02/21.  If deemed therapeutically appropriate, a statin exclusion code could be associated with the visit. The patient is on Repatha.  Associating a statin exclusion code would remove the patient from the measure.  (The allergy listed for the patient says "itching" but after you click for additional information, it says myalgias.)  The ASCVD Risk score (Arnett DK, et al., 2019) failed to calculate for the following reasons:   The patient has a prior MI or stroke diagnosis 07/31/2020     Component Value Date/Time   CHOL 135 07/31/2020 0917   TRIG 157 (H) 07/31/2020 0917   HDL 61 07/31/2020 0917   CHOLHDL 2.2 07/31/2020 0917   CHOLHDL 5.2 (H) 04/04/2016 0723   VLDL NOT CALC 04/04/2016 0723   LDLCALC 48 07/31/2020 0917   LDLDIRECT 50 04/04/2016 0723    Please consider ONE of the following recommendations:  Initiate high intensity statin Atorvastatin '40mg'$  once daily, #90, 3 refills   Rosuvastatin '20mg'$  once daily, #90, 3 refills    Initiate moderate intensity          statin with reduced frequency if prior          statin intolerance 1x weekly, #13, 3 refills   2x weekly, #26, 3 refills   3x weekly, #39, 3 refills    Code for past statin intolerance or  other exclusions (required annually)   Provider Requirements:   Associate code during an office visit or telehealth encounter  Drug Induced Myopathy G72.0   Myopathy, unspecified G72.9   Myositis, unspecified M60.9   Rhabdomyolysis K80.03   Alcoholic fatty liver K91.7   Cirrhosis of liver K74.69   Prediabetes R73.03   PCOS E28.2   Toxic liver disease, unspecified K71.9        Plan:  Route note to provider prior to upcoming appointment.  Elayne Guerin, PharmD, [place] Clinical Pharmacist [phone]

## 2021-10-02 ENCOUNTER — Encounter: Payer: Self-pay | Admitting: Internal Medicine

## 2021-10-02 ENCOUNTER — Ambulatory Visit (INDEPENDENT_AMBULATORY_CARE_PROVIDER_SITE_OTHER): Payer: Medicare HMO | Admitting: Internal Medicine

## 2021-10-02 VITALS — BP 130/80 | HR 94 | Temp 98.0°F | Wt 153.3 lb

## 2021-10-02 DIAGNOSIS — I5042 Chronic combined systolic (congestive) and diastolic (congestive) heart failure: Secondary | ICD-10-CM | POA: Diagnosis not present

## 2021-10-02 DIAGNOSIS — I255 Ischemic cardiomyopathy: Secondary | ICD-10-CM | POA: Diagnosis not present

## 2021-10-02 DIAGNOSIS — I25810 Atherosclerosis of coronary artery bypass graft(s) without angina pectoris: Secondary | ICD-10-CM

## 2021-10-02 DIAGNOSIS — E782 Mixed hyperlipidemia: Secondary | ICD-10-CM

## 2021-10-02 DIAGNOSIS — E559 Vitamin D deficiency, unspecified: Secondary | ICD-10-CM

## 2021-10-02 DIAGNOSIS — E1159 Type 2 diabetes mellitus with other circulatory complications: Secondary | ICD-10-CM

## 2021-10-02 DIAGNOSIS — I739 Peripheral vascular disease, unspecified: Secondary | ICD-10-CM | POA: Diagnosis not present

## 2021-10-02 DIAGNOSIS — I10 Essential (primary) hypertension: Secondary | ICD-10-CM

## 2021-10-02 LAB — POCT GLYCOSYLATED HEMOGLOBIN (HGB A1C): Hemoglobin A1C: 6.4 % — AB (ref 4.0–5.6)

## 2021-10-02 LAB — VITAMIN D 25 HYDROXY (VIT D DEFICIENCY, FRACTURES): VITD: 40.33 ng/mL (ref 30.00–100.00)

## 2021-10-02 MED ORDER — VITAMIN D 50 MCG (2000 UT) PO CAPS: 1.0000 | ORAL_CAPSULE | Freq: Every day | ORAL | 1 refills | Status: DC

## 2021-10-02 NOTE — Progress Notes (Signed)
Established Patient Office Visit     CC/Reason for Visit: 33-monthfollow-up chronic conditions  HPI: Michelle Gatchelis a 71y.o. female who is coming in today for the above mentioned reasons. Past Medical History is significant for: coronary artery disease, A. fib, diabetes that has been well controlled and hyperlipidemia.  At last visit she complained of significant fatigue.  She was noted to have vitamin D deficiency and tells me she has significantly improved with replacement.  She had to reschedule her sleep study that was requested for the same reason.  She had her colonoscopy in May.  She saw cardiology and was found to be in complete heart block and pacemaker dependent.  They found no cardiac etiology to her fatigue.  She has been complaining of right leg numbness and tingling especially with movement.  It feels "ice cold on the inside".  She has not noticed any color changes.  She has never had an evaluation for arterial circulation.   Past Medical/Surgical History: Past Medical History:  Diagnosis Date   Acute on chronic combined systolic and diastolic CHF, NYHA class 3 ([place] 03/19/2013   AICD (automatic cardioverter/defibrillator) present 01/2013   Biventricular cardiac pacemaker in situ    Allergic rhinitis    Anogenital warts 01/22/2011   Anxiety state, unspecified 05/13/2013   BREAST CANCER, HX OF 09/19/2009   Qualifier: Diagnosis of  By: TSherren MochaMD, JDellis FilbertA    CAD (coronary artery disease)    [place]. [place];  b. 07/2012 Cath: LM 30-40, LAD 70p, 70/849mD1 80-90p, OM1 small 90p, OM2 large 80-90p, 5056m0-80d, RCA 20-30 diff, EF 40%, glob HK.s/p CABG   Cancer of left breast ([id number]   Patient reports left breast cancer diagnosis in 2002 treated with bilateral mastectomy positive lymph nodes with left axillary dissection followed by chemotherapy of unknown type   Cataract    Chronic combined systolic and diastolic CHF, NYHA class 2 (HCCAtwater10/31/2014   Diabetes mellitus type II    Exertional dyspnea 08/05/2014   Exertional shortness of breath    Generalized osteoarthrosis, involving multiple sites 08/25/2015   History of colon polyps    Hyperlipidemia    Hypertension    Incisional hernia, without obstruction or gangrene 05/04/2015   Ischemic cardiomyopathy    a. 07/2012 Echo: EF 35%, Sev inferoseptal HK, mildly dil LA, Peak PASP 70m63m   LBBB (left bundle branch block)    a. intermittent - present during rapid afib 07/2012.   MYOCARDIAL INFARCTION, HX OF 09/19/2009   Qualifier: Diagnosis of  By: ToddSherren Mocha JeffJory EeNeuromuscular disorder ([facility] Patient reports chronic numbness in the right foot related to previous surgery on the right leg and "nerve damage"   PAF (paroxysmal atrial fibrillation) (HCC)[place] a. 07/2012: Amio and xarelto initiated.   Right carotid bruit 08/11/2012   SBO (small bowel obstruction) (HCC)    SOB (shortness of breath) 02/26/2018   Type 2 diabetes mellitus with circulatory disorder, without long-term current use of insulin (HCC)[place]/25/2017   Vitamin D deficiency 02/22/2016    Past Surgical History:  Procedure Laterality Date   ABDOMINAL HYSTERECTOMY  2000   APPENDECTOMY  1974   BI-VENTRICULAR PACEMAKER INSERTION N/A 01/25/2013   Procedure: BI-VENTRICULAR PACEMAKER INSERTION (CRT-P);  Surgeon: GregEvans Lance;  [facility];  Service: Cardiovascular;  Laterality: N/A;   BREAST BIOPSY Left 2002  CARDIAC CATHETERIZATION     CHOLECYSTECTOMY OPEN  1974   CORONARY ARTERY BYPASS GRAFT N/A 09/28/2012   Procedure: CORONARY ARTERY BYPASS GRAFTING (CABG);  Surgeon: [name], MD;  Location: [place];  Service: Open Heart Surgery;  Laterality: N/A;  CABG x four, using left internal mammary artery and left leg greater saphenous vein harvested endoscopically   DILATION AND CURETTAGE OF UTERUS  1983   EPICARDIAL PACING LEAD PLACEMENT N/A 09/28/2012   Procedure: EPICARDIAL PACING LEAD  PLACEMENT;  Surgeon: [name], MD;  Location: [place];  Service: Thoracic;  Laterality: N/A;  LV LEAD PLACEMENT   HERNIA REPAIR     INCISIONAL HERNIA REPAIR N/A 05/25/2015   Procedure: LAPAROSCOPIC INCISIONAL HERNIA WITH MESH ;  Surgeon: [name], MD;  Location: [place];  Service: General;  Laterality: N/A;   INCONTINENCE SURGERY  2000   INSERTION OF MESH N/A 05/25/2015   Procedure: INSERTION OF MESH;  Surgeon: [name], MD;  Location: [place];  Service: General;  Laterality: N/A;   INTRAOPERATIVE TRANSESOPHAGEAL ECHOCARDIOGRAM N/A 09/28/2012   Procedure: INTRAOPERATIVE TRANSESOPHAGEAL ECHOCARDIOGRAM;  Surgeon: [name], MD;  Location: [place];  Service: Open Heart Surgery;  Laterality: N/A;   LAPAROSCOPIC INCISIONAL / UMBILICAL / VENTRAL HERNIA REPAIR  05/25/2015   IHR   LEFT HEART CATHETERIZATION WITH CORONARY ANGIOGRAM N/A 07/20/2012   Procedure: LEFT HEART CATHETERIZATION WITH CORONARY ANGIOGRAM;  Surgeon: [name], MD;  Location: [facility] CATH LAB;  Service: Cardiovascular;  Laterality: N/A;   MASTECTOMY Right 2002   MASTECTOMY MODIFIED RADICAL W/ AXILLARY LYMPH NODES W/ OR W/O PECTORALIS MINOR Left 2002   MAZE N/A 09/28/2012   Procedure: MAZE;  Surgeon: [name], MD;  Location: [place];  Service: Open Heart Surgery;  Laterality: N/A;   ORIF WRIST FRACTURE Right 11/08/2017   Procedure: OPEN REDUCTION INTERNAL FIXATION (ORIF) WRIST FRACTURE;  Surgeon: [name], MD;  Location: [place];  Service: Orthopedics;  Laterality: Right;  90 mins   PPM GENERATOR CHANGEOUT N/A 04/17/2020   Procedure: PPM GENERATOR CHANGEOUT;  Surgeon: [name], MD;  Location: [employer] [facility] LAB;  Service: Cardiovascular;  Laterality: N/A;   TENDON REPAIR Right 2001 X 3-4   torn ligaments and tendons in ankle up to knee from work related accident   [place]    Social History:  reports that she quit smoking about 9 years ago. Her smoking use included cigarettes. She  has a 10.00 pack-year smoking history. She has never used smokeless tobacco. She reports that she does not drink alcohol and does not use drugs.  Allergies: Allergies  Allergen Reactions   Statins Itching    Myalgias with rosuvastatin, atorvastatin and simvastatin     Family History:  Family History  Problem Relation Age of Onset   Kidney failure Mother    Heart disease Mother        Died mid 28B complications of diabetes   Diabetes Mother    Colon cancer Sister    Arthritis Other    Diabetes Other    Hyperlipidemia Other    Ovarian cancer Other    Liver cancer Brother    Stomach cancer Neg Hx    Rectal cancer Neg Hx    Esophageal cancer Neg Hx    Pancreatic cancer Neg Hx      Current Outpatient Medications:    APPLE CIDER VINEGAR PO, Take 5,220 mg by mouth in the morning, at noon, and at bedtime. [place]  mg each, Disp: , Rfl:    Blood Glucose Monitoring Suppl (TRUE METRIX AIR GLUCOSE METER) DEVI, Check blood sugar 2 times a day, Disp: 1 each, Rfl: 0   carvedilol (COREG) 12.5 MG tablet, TAKE 1 TABLET (12.5 MG) BY MOUTH 2 (TWO) TIMES DAILY WITH A MEAL., Disp: 180 tablet, Rfl: 3   furosemide (LASIX) 20 MG tablet, TAKE 1 TABLET EVERY DAY, Disp: 90 tablet, Rfl: 3   glipiZIDE (GLUCOTROL) 5 MG tablet, TAKE 1 TABLET TWICE DAILY  BEFORE  MEALS, Disp: 180 tablet, Rfl: 0   glucose blood (TRUE METRIX BLOOD GLUCOSE TEST) test strip, Use to check blood sugar 2 times a day., Disp: 200 each, Rfl: 12   isosorbide mononitrate (IMDUR) 60 MG 24 hr tablet, TAKE 1 TABLET EVERY DAY, Disp: 90 tablet, Rfl: 3   JARDIANCE 25 MG TABS tablet, TAKE 1 TABLET EVERY DAY BEFORE BREAKFAST, Disp: 90 tablet, Rfl: 1   losartan (COZAAR) 25 MG tablet, TAKE 1 TABLET TWICE DAILY, Disp: 180 tablet, Rfl: 3   metFORMIN (GLUCOPHAGE) 1000 MG tablet, TAKE ONE TABLET BY MOUTH TWICE A DAY WITH FOOD (Patient taking differently: Take 1,000 mg by mouth 2 (two) times daily with a meal.), Disp: 200 tablet, Rfl: 4   nitroGLYCERIN  (NITROSTAT) 0.4 MG SL tablet, Place 1 tablet (0.4 mg total) under the tongue every 5 (five) minutes as needed for chest pain., Disp: 25 tablet, Rfl: 3   REPATHA SURECLICK 557 MG/ML SOAJ, INJECT 140 MG INTO THE SKIN EVERY 14 (FOURTEEN) DAYS., Disp: 6 mL, Rfl: 3   TRUEplus Lancets 33G MISC, Use to check blood sugar 2 times a day., Disp: 200 each, Rfl: 12   TRULICITY 1.5 DU/2.0UR SOPN, INJECT 1.'5MG'$  (1 PEN) UNDER THE SKIN EVERY WEEK, Disp: 2.5 mL, Rfl: 2   XARELTO 20 MG TABS tablet, TAKE 1 TABLET (20 MG TOTAL) BY MOUTH DAILY WITH SUPPER., Disp: 90 tablet, Rfl: 3  Review of Systems:  Constitutional: Denies fever, chills, diaphoresis, appetite change and fatigue.  HEENT: Denies photophobia, eye pain, redness, hearing loss, ear pain, congestion, sore throat, rhinorrhea, sneezing, mouth sores, trouble swallowing, neck pain, neck stiffness and tinnitus.   Respiratory: Denies SOB, DOE, cough, chest tightness,  and wheezing.   Cardiovascular: Denies chest pain, palpitations and leg swelling.  Gastrointestinal: Denies nausea, vomiting, abdominal pain, diarrhea, constipation, blood in stool and abdominal distention.  Genitourinary: Denies dysuria, urgency, frequency, hematuria, flank pain and difficulty urinating.  Endocrine: Denies: hot or cold intolerance, sweats, changes in hair or nails, polyuria, polydipsia. Musculoskeletal: Denies myalgias, back pain, joint swelling, arthralgias and gait problem.  Skin: Denies pallor, rash and wound.  Neurological: Denies dizziness, seizures, syncope, weakness, light-headedness,  and headaches.  Hematological: Denies adenopathy. Easy bruising, personal or family bleeding history  Psychiatric/Behavioral: Denies suicidal ideation, mood changes, confusion, nervousness, sleep disturbance and agitation    Physical Exam: Vitals:   10/02/21 0802 10/02/21 0805 10/02/21 0835  BP: 140/90 (!) 148/93 130/80  Pulse: 94    Temp: 98 F (36.7 C)    TempSrc: Oral    SpO2: 98%     Weight: 153 lb 4.8 oz (69.5 kg)      Body mass index is 26.31 kg/m.   Constitutional: NAD, calm, comfortable Eyes: PERRL, lids and conjunctivae normal ENMT: Mucous membranes are moist.  Respiratory: clear to auscultation bilaterally, no wheezing, no crackles. Normal respiratory effort. No accessory muscle use.  Cardiovascular: Regular rate and rhythm, no murmurs / rubs / gallops. No extremity edema.  Psychiatric:  Normal judgment and insight. Alert and oriented x 3. Normal mood.    Impression and Plan:  Type 2 diabetes mellitus with other circulatory complication, without long-term current use of insulin ([place])  - Plan: POCT glycosylated hemoglobin (Hb A1C) -Well-controlled with an A1c of 6.4.  Vitamin D deficiency  - Plan: VITAMIN D 25 Hydroxy (Vit-D Deficiency, Fractures), VITAMIN D 25 Hydroxy (Vit-D Deficiency, Fractures)  Mixed hyperlipidemia -At goal with a cholesterol of 135, triglycerides 150 and LDL 48 as of May.  Essential hypertension -Blood pressure was a little high initially in office but has decreased to 130/80 by the time she has left.  Coronary artery disease involving coronary bypass graft of native heart without angina pectoris Chronic combined systolic and diastolic CHF, NYHA class 2 (HCC) Ischemic cardiomyopathy -Followed by cardiology and is compensated.  Intermittent claudication (HCC)  - Plan: VAS [place] ABI WITH/WO TBI -Given her history she is a set up for PAD and I feel this is the most important thing to rule out, ABIs have been ordered, can also consider neuropathy as a potential etiology.    Time spent:32 minutes reviewing chart, interviewing and examining patient and formulating plan of care.     Lelon Frohlich, MD [facility] at [facility]

## 2021-10-09 ENCOUNTER — Encounter: Payer: Self-pay | Admitting: Internal Medicine

## 2021-10-09 ENCOUNTER — Encounter: Payer: Self-pay | Admitting: Cardiovascular Disease

## 2021-10-09 NOTE — Telephone Encounter (Signed)
Patient called back and acknowledged receipt of message

## 2021-10-09 NOTE — Telephone Encounter (Signed)
Per Dr Jerilee Hoh: If she is having chest pain, I would advise ED evaluation given her history. She should at least contact her cardiologist, but that would be my recommendation.   LVM instructions for pt to cal back for Dr hernandez's response. Will also send via my chart.

## 2021-10-09 NOTE — Telephone Encounter (Signed)
Please have her increase the carvedilol to 25 mg twice daily.

## 2021-10-12 ENCOUNTER — Emergency Department (HOSPITAL_COMMUNITY): Payer: Medicare HMO

## 2021-10-12 ENCOUNTER — Encounter (HOSPITAL_COMMUNITY): Payer: Self-pay | Admitting: Emergency Medicine

## 2021-10-12 ENCOUNTER — Emergency Department (HOSPITAL_COMMUNITY)
Admission: EM | Admit: 2021-10-12 | Discharge: 2021-10-12 | Disposition: A | Discharge status: Home / Self Care | Payer: Medicare HMO | Attending: Emergency Medicine | Admitting: Emergency Medicine

## 2021-10-12 ENCOUNTER — Other Ambulatory Visit: Payer: Self-pay

## 2021-10-12 DIAGNOSIS — S199XXA Unspecified injury of neck, initial encounter: Secondary | ICD-10-CM | POA: Diagnosis not present

## 2021-10-12 DIAGNOSIS — S0990XA Unspecified injury of head, initial encounter: Secondary | ICD-10-CM | POA: Diagnosis not present

## 2021-10-12 DIAGNOSIS — M542 Cervicalgia: Secondary | ICD-10-CM | POA: Diagnosis not present

## 2021-10-12 DIAGNOSIS — Z794 Long term (current) use of insulin: Secondary | ICD-10-CM | POA: Insufficient documentation

## 2021-10-12 DIAGNOSIS — R519 Headache, unspecified: Secondary | ICD-10-CM | POA: Insufficient documentation

## 2021-10-12 DIAGNOSIS — Y92410 Unspecified street and highway as the place of occurrence of the external cause: Secondary | ICD-10-CM | POA: Insufficient documentation

## 2021-10-12 DIAGNOSIS — R109 Unspecified abdominal pain: Secondary | ICD-10-CM | POA: Insufficient documentation

## 2021-10-12 DIAGNOSIS — R079 Chest pain, unspecified: Secondary | ICD-10-CM | POA: Insufficient documentation

## 2021-10-12 DIAGNOSIS — Z7901 Long term (current) use of anticoagulants: Secondary | ICD-10-CM | POA: Insufficient documentation

## 2021-10-12 DIAGNOSIS — I2510 Atherosclerotic heart disease of native coronary artery without angina pectoris: Secondary | ICD-10-CM | POA: Diagnosis not present

## 2021-10-12 DIAGNOSIS — S3993XA Unspecified injury of pelvis, initial encounter: Secondary | ICD-10-CM | POA: Diagnosis not present

## 2021-10-12 DIAGNOSIS — R0789 Other chest pain: Secondary | ICD-10-CM | POA: Diagnosis not present

## 2021-10-12 DIAGNOSIS — I10 Essential (primary) hypertension: Secondary | ICD-10-CM | POA: Diagnosis not present

## 2021-10-12 LAB — BASIC METABOLIC PANEL
Anion gap: 12 (ref 5–15)
BUN: 18 mg/dL (ref 8–23)
CO2: 25 mmol/L (ref 22–32)
Calcium: 9.6 mg/dL (ref 8.9–10.3)
Chloride: 101 mmol/L (ref 98–111)
Creatinine, Ser: 0.73 mg/dL (ref 0.44–1.00)
GFR, Estimated: >60 mL/min (ref >60)
Glucose, Bld: 144 mg/dL — ABNORMAL HIGH (ref 70–99)
Potassium: 3.6 mmol/L (ref 3.5–5.1)
Sodium: 138 mmol/L (ref 135–145)

## 2021-10-12 LAB — CBC WITH DIFFERENTIAL/PLATELET
Abs Immature Granulocytes: 0.01 10*3/uL (ref 0.00–0.07)
Basophils Absolute: 0 10*3/uL (ref 0.0–0.1)
Basophils Relative: 0 %
Eosinophils Absolute: 0.1 10*3/uL (ref 0.0–0.5)
Eosinophils Relative: 3 %
HCT: 37.5 % (ref 36.0–46.0)
Hemoglobin: 12.5 g/dL (ref 12.0–15.0)
Immature Granulocytes: 0 %
Lymphocytes Relative: 30 %
Lymphs Abs: 1.7 10*3/uL (ref 0.7–4.0)
MCH: 30.7 pg (ref 26.0–34.0)
MCHC: 33.3 g/dL (ref 30.0–36.0)
MCV: 92.1 fL (ref 80.0–100.0)
Monocytes Absolute: 0.5 10*3/uL (ref 0.1–1.0)
Monocytes Relative: 8 %
Neutro Abs: 3.2 10*3/uL (ref 1.7–7.7)
Neutrophils Relative %: 59 %
Platelets: 231 10*3/uL (ref 150–400)
RBC: 4.07 MIL/uL (ref 3.87–5.11)
RDW: 12.7 % (ref 11.5–15.5)
WBC: 5.6 10*3/uL (ref 4.0–10.5)
nRBC: 0 % (ref 0.0–0.2)

## 2021-10-12 MED ORDER — CARVEDILOL 12.5 MG PO TABS: ORAL_TABLET | ORAL | 3 refills | Status: DC

## 2021-10-12 MED ORDER — ACETAMINOPHEN 325 MG PO TABS
650.0000 mg | ORAL_TABLET | Freq: Once | ORAL | Status: AC
  Administered 2021-10-12: 650 mg via ORAL
  Filled 2021-10-12: qty 2

## 2021-10-12 MED ORDER — IOHEXOL 300 MG/ML  SOLN
85.0000 mL | Freq: Once | INTRAMUSCULAR | Status: AC | PRN
  Administered 2021-10-12: 85 mL via INTRAVENOUS
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
## 2021-10-12 NOTE — ED Notes (Signed)
Patient transported to CT 

## 2021-10-13 NOTE — Telephone Encounter (Signed)
The last 2 days of blood pressure are great. Yes, the recommended carvedilol dose is 25 mg twice a day. If this continues to work well for you, will send in that new prescription for the 25 mg tablets. Meanwhile, finish up the 12.5 mg tablets by taking two at a time. Best wishes, Dr. Loletha Grayer

## 2021-10-16 ENCOUNTER — Ambulatory Visit (INDEPENDENT_AMBULATORY_CARE_PROVIDER_SITE_OTHER): Payer: Medicare HMO

## 2021-10-16 ENCOUNTER — Ambulatory Visit: Payer: Medicare HMO | Admitting: Family Medicine

## 2021-10-16 DIAGNOSIS — I442 Atrioventricular block, complete: Secondary | ICD-10-CM

## 2021-10-16 LAB — CUP PACEART REMOTE DEVICE CHECK
Battery Remaining Longevity: 65 mo
Battery Remaining Percentage: 78 %
Battery Voltage: 2.99 V
Brady Statistic AP VP Percent: 1 %
Brady Statistic AP VS Percent: 0 %
Brady Statistic AS VP Percent: 99 %
Brady Statistic AS VS Percent: 1 %
Brady Statistic RA Percent Paced: 1 %
Date Time Interrogation Session: 20230808020013
Implantable Lead Implant Date: 20140721
Implantable Lead Implant Date: 20141117
Implantable Lead Implant Date: 20141117
Implantable Lead Location: 753858
Implantable Lead Location: 753859
Implantable Lead Location: 753860
Implantable Lead Model: 5071
Implantable Pulse Generator Implant Date: 20220207
Lead Channel Impedance Value: 400 Ohm
Lead Channel Impedance Value: 400 Ohm
Lead Channel Impedance Value: 550 Ohm
Lead Channel Pacing Threshold Amplitude: 0.5 V
Lead Channel Pacing Threshold Amplitude: 0.75 V
Lead Channel Pacing Threshold Amplitude: 1.25 V
Lead Channel Pacing Threshold Pulse Width: 0.4 ms
Lead Channel Pacing Threshold Pulse Width: 0.4 ms
Lead Channel Pacing Threshold Pulse Width: 0.5 ms
Lead Channel Sensing Intrinsic Amplitude: 0.5 mV
Lead Channel Sensing Intrinsic Amplitude: 12 mV
Lead Channel Setting Pacing Amplitude: 2 V
Lead Channel Setting Pacing Amplitude: 2.5 V
Lead Channel Setting Pacing Amplitude: 2.5 V
Lead Channel Setting Pacing Pulse Width: 0.4 ms
Lead Channel Setting Pacing Pulse Width: 0.5 ms
Lead Channel Setting Sensing Sensitivity: 2 mV
Pulse Gen Model: 3222
Pulse Gen Serial Number: 3859407

## 2021-10-25 ENCOUNTER — Encounter: Payer: Self-pay | Admitting: Internal Medicine

## 2021-10-25 ENCOUNTER — Ambulatory Visit (INDEPENDENT_AMBULATORY_CARE_PROVIDER_SITE_OTHER): Payer: Medicare HMO | Admitting: Internal Medicine

## 2021-10-25 DIAGNOSIS — M791 Myalgia, unspecified site: Secondary | ICD-10-CM | POA: Diagnosis not present

## 2021-10-25 DIAGNOSIS — R519 Headache, unspecified: Secondary | ICD-10-CM

## 2021-10-25 MED ORDER — CYCLOBENZAPRINE HCL 5 MG PO TABS: 5.0000 mg | ORAL_TABLET | Freq: Two times a day (BID) | ORAL | 0 refills | Status: DC | PRN
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
## 2021-10-26 ENCOUNTER — Encounter: Payer: Self-pay | Admitting: Internal Medicine

## 2021-11-05 IMAGING — DX DG CHEST 2V
2 series · 2 of 2 positions shown · non-contrast
Comparison: February 16, 2018

CLINICAL DATA: Right-sided chest pain x1 day.

EXAM:
CHEST - 2 VIEW

[chest pa]
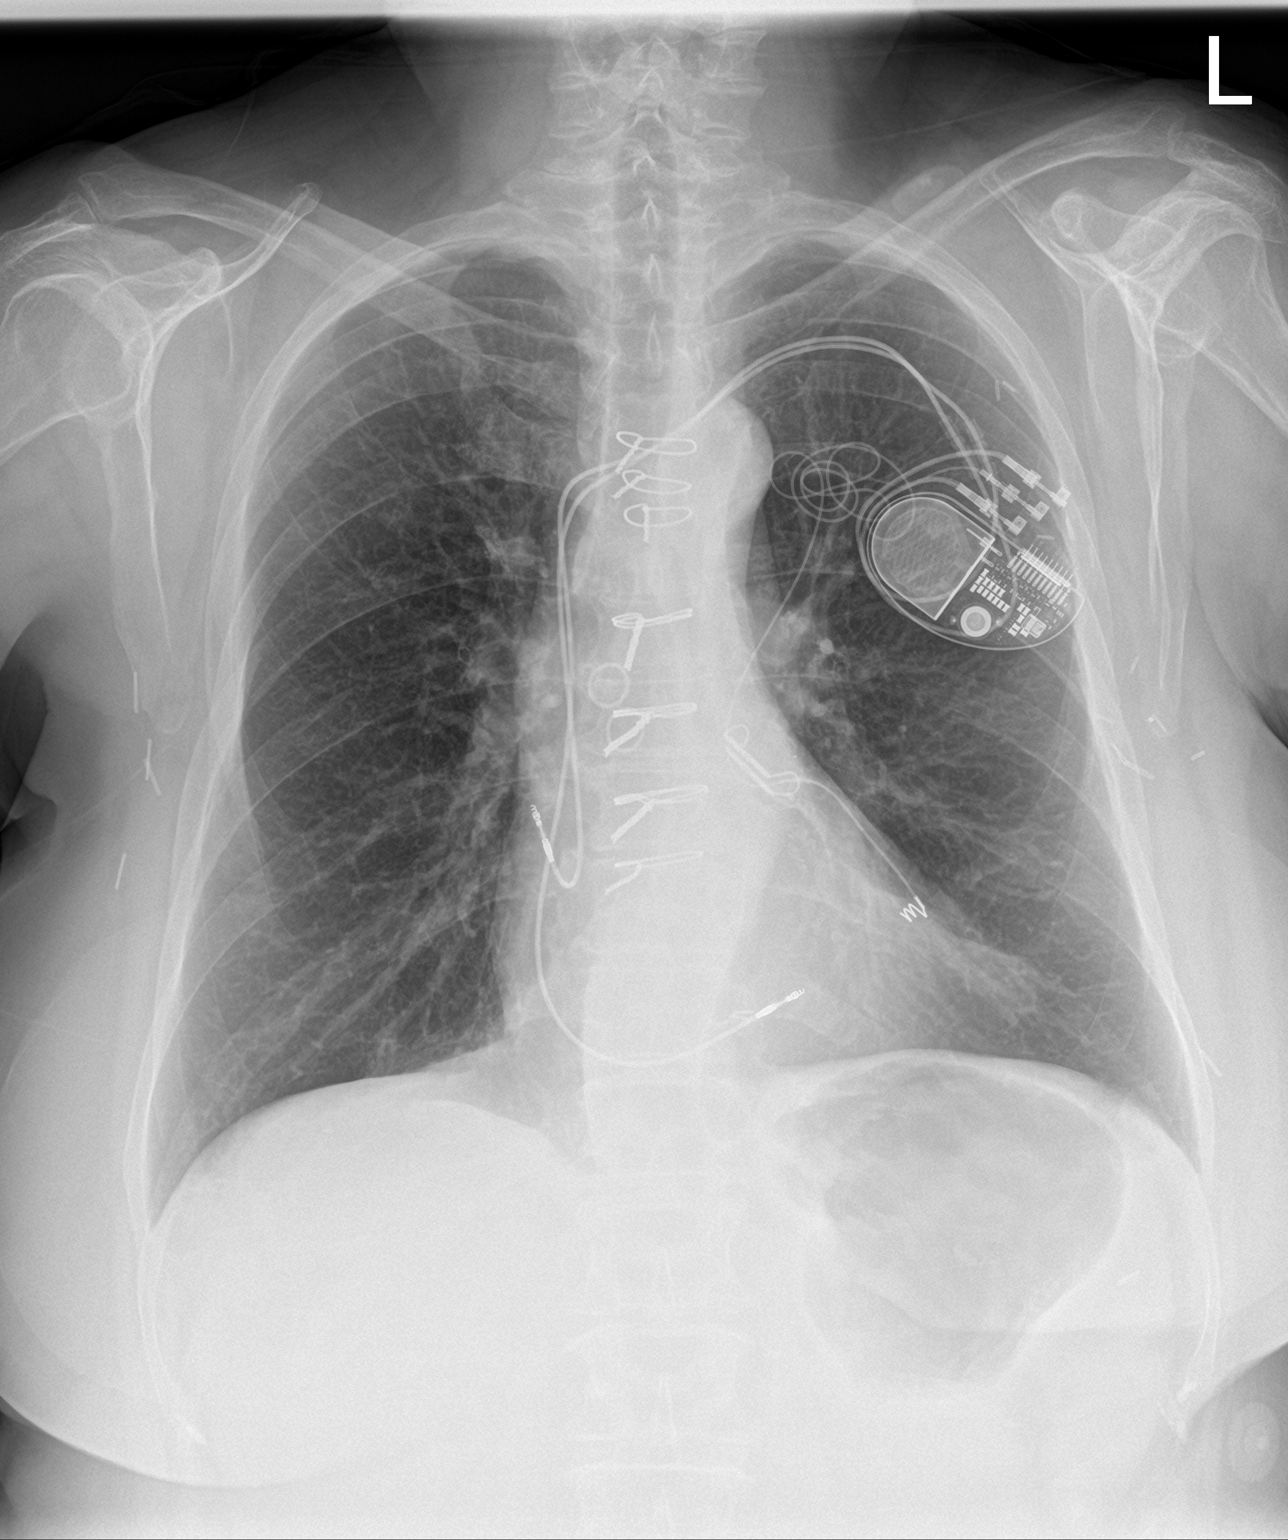

[chest lat]
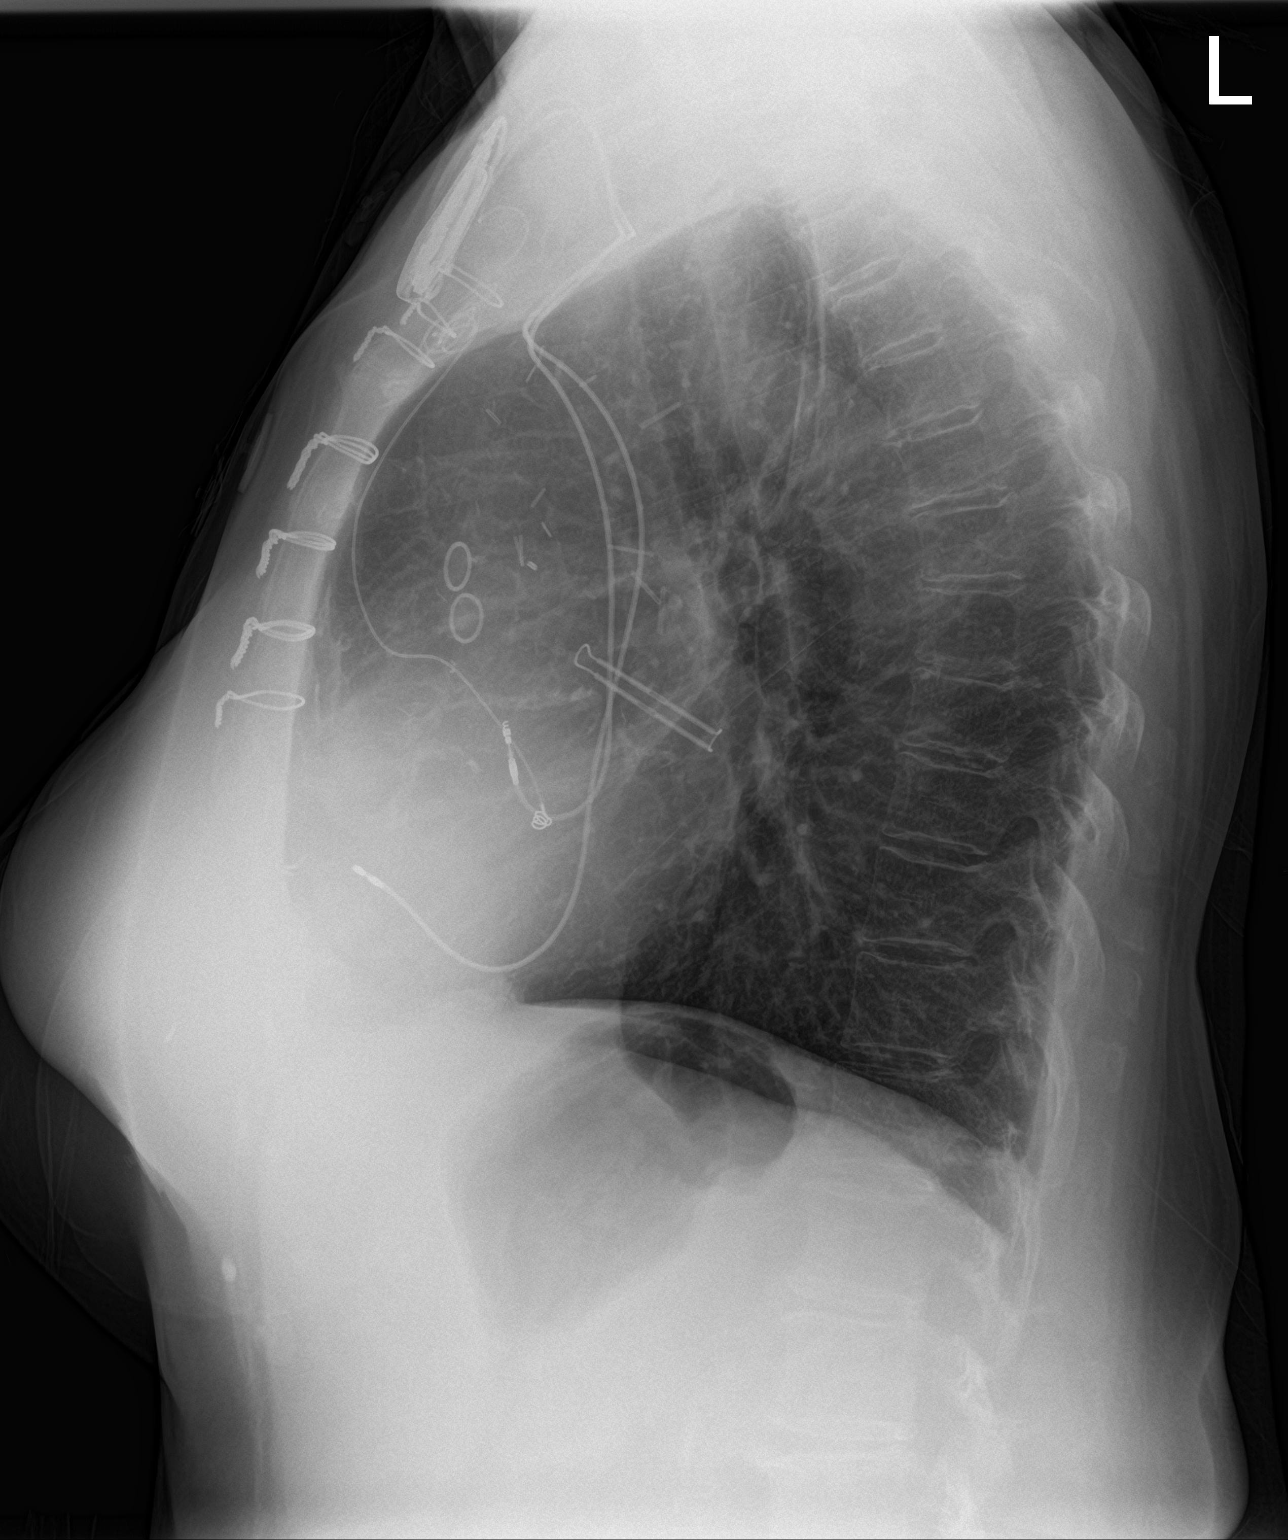

[2 of 2 positions shown; findings below may reference images not displayed]

FINDINGS: Multiple sternal wires are seen. A dual lead AICD is noted. The
lungs are hyperinflated. There is no evidence of acute infiltrate,
pleural effusion or pneumothorax. The heart size and mediastinal
contours are within normal limits. Radiopaque surgical clips are
seen overlying the bilateral axilla. The visualized skeletal
structures are unremarkable.
IMPRESSION: 1. Evidence of prior median sternotomy/CABG.
2. No acute or active cardiopulmonary disease.

## 2021-11-07 ENCOUNTER — Encounter (INDEPENDENT_AMBULATORY_CARE_PROVIDER_SITE_OTHER): Payer: Medicare HMO

## 2021-11-15 ENCOUNTER — Institutional Professional Consult (permissible substitution): Payer: Medicare HMO | Admitting: Neurology

## 2021-11-20 NOTE — Progress Notes (Signed)
Remote pacemaker transmission.   

## 2021-11-22 ENCOUNTER — Other Ambulatory Visit: Payer: Self-pay | Admitting: Internal Medicine

## 2021-11-22 DIAGNOSIS — E1159 Type 2 diabetes mellitus with other circulatory complications: Secondary | ICD-10-CM

## 2021-11-24 ENCOUNTER — Other Ambulatory Visit: Payer: Self-pay | Admitting: Cardiovascular Disease

## 2021-11-24 DIAGNOSIS — I10 Essential (primary) hypertension: Secondary | ICD-10-CM

## 2021-11-27 ENCOUNTER — Encounter: Payer: Self-pay | Admitting: Neurology

## 2021-11-27 ENCOUNTER — Telehealth: Payer: Self-pay | Admitting: *Deleted

## 2021-11-27 ENCOUNTER — Ambulatory Visit (INDEPENDENT_AMBULATORY_CARE_PROVIDER_SITE_OTHER): Payer: Medicare HMO | Admitting: Neurology

## 2021-11-27 VITALS — BP 111/68 | HR 84 | Ht 64.0 in | Wt 148.6 lb

## 2021-11-27 DIAGNOSIS — I480 Paroxysmal atrial fibrillation: Secondary | ICD-10-CM

## 2021-11-27 DIAGNOSIS — I5042 Chronic combined systolic (congestive) and diastolic (congestive) heart failure: Secondary | ICD-10-CM

## 2021-11-27 DIAGNOSIS — E663 Overweight: Secondary | ICD-10-CM

## 2021-11-27 DIAGNOSIS — R0683 Snoring: Secondary | ICD-10-CM | POA: Diagnosis not present

## 2021-11-27 DIAGNOSIS — G4719 Other hypersomnia: Secondary | ICD-10-CM | POA: Diagnosis not present

## 2021-11-27 DIAGNOSIS — Z951 Presence of aortocoronary bypass graft: Secondary | ICD-10-CM

## 2021-11-27 DIAGNOSIS — Z9189 Other specified personal risk factors, not elsewhere classified: Secondary | ICD-10-CM

## 2021-11-27 DIAGNOSIS — R351 Nocturia: Secondary | ICD-10-CM | POA: Diagnosis not present

## 2021-11-27 NOTE — Patient Outreach (Signed)
  Care Coordination   11/27/2021 Name: Devani Odonnel MRN: [id number] DOB: 02-Sep-1950   Care Coordination Outreach Attempts:  An unsuccessful telephone outreach was attempted today to offer the patient information about available care coordination services as a benefit of their health plan.   Follow Up Plan:  Additional outreach attempts will be made to offer the patient care coordination information and services.   Encounter Outcome:  No Answer  Care Coordination Interventions Activated:  No   Care Coordination Interventions:  No, not indicated    Raina Mina, RN Care Management Coordinator [place] Office [phone]

## 2021-11-27 NOTE — Patient Instructions (Signed)

## 2021-11-27 NOTE — Progress Notes (Signed)
Subjective:    Patient ID: [name] is a 71 y.o. female.  HPI    Star Age, MD, PhD [facility] [address] P[address]O. [place], [place] [address]  Dear Dr. Isaac Bliss,  I saw your patient, [name], upon your kind request in my sleep clinic today for initial consultation of her sleep disorder, in particular, concern for underlying obstructive sleep apnea.  The patient is unaccompanied today. As you know, [name] is a 71 year old right-handed woman with an underlying complex medical history of coronary artery disease with history of MI, ischemic cardiomyopathy, left bundle branch block, paroxysmal A-fib, status post CABG, status post AICD placement, chronic systolic and diastolic congestive heart failure, hypertension, hyperlipidemia, diabetes, allergic rhinitis, breast cancer, small bowel obstruction, vitamin D deficiency, borderline overweight state, status post multiple surgeries including bilateral mastectomies, hernia repair, tubal ligation, CABG x4, AICD placement, who reports snoring and excessive daytime somnolence.  She does not sleep well at night with multiple nighttime awakenings.  I reviewed your office note from 07/03/2021.  Her Epworth sleepiness score is 7 out of 24, fatigue severity score is 36 out of 63.  She reports that her fatigue is actually improved since she started vitamin D.  She was found to have quite low vitamin D in April 2023, in July upon checkup her vitamin D was much improved.  She still gets tired during the day.  She has significant nocturia about 3 times per average night.  She lives with her husband.  She has 3 grown children, 7 grandchildren and 6 great-grandchildren.  She does spend quite a bit of time with her great-grandchildren and tries to stay active.  She is a retired from [employer], recently started working part-time at [employer] and there after hours program.  She stopped smoking in 2014, she  drinks caffeine occasionally on the weekends, rare alcohol.  They have 3 dogs in the household, they do tend to sleep on the bed with them.  Her husband likes the TV on at night but she turns it off before falling asleep.  Bedtime is generally between 10 PM and midnight and rise time around 6 AM.  She has no family history of sleep apnea.  She denies recurrent morning headaches.  Her Past Medical History Is Significant For: Past Medical History:  Diagnosis Date   Acute on chronic combined systolic and diastolic CHF, NYHA class 3 ([place]) 03/19/2013   AICD (automatic cardioverter/defibrillator) present 01/2013   Biventricular cardiac pacemaker in situ    Allergic rhinitis    Anogenital warts 01/22/2011   Anxiety state, unspecified 05/13/2013   BREAST CANCER, HX OF 09/19/2009   Qualifier: Diagnosis of  By: [name], [name]    CAD (coronary artery disease)    a. 2004: s/p MI in [place]. No PCI->Medical RX;  b. 07/2012 Cath: LM 30-40, LAD 70p, 70/78m D1 80-90p, OM1 small 90p, OM2 large 80-90p, 532m70-80d, RCA 20-30 diff, EF 40%, glob HK.s/p CABG   [place]   Patient reports left breast cancer diagnosis in 2002 treated with bilateral mastectomy positive lymph nodes with left axillary dissection followed by chemotherapy of unknown type   Cataract    Chronic combined systolic and diastolic CHF, [place]HA class 2 [place]   Diabetes mellitus type II    Exertional dyspnea 08/05/2014   Exertional shortness of breath    Generalized osteoarthrosis, involving multiple sites 08/25/2015   History of colon polyps  Hyperlipidemia    Hypertension    Incisional hernia, without obstruction or gangrene 05/04/2015   Ischemic cardiomyopathy    a. 07/2012 Echo: EF 35%, Sev inferoseptal HK, mildly dil LA, Peak PASP 41mHg.   LBBB (left bundle branch block)    a. intermittent - present during rapid afib 07/2012.   MYOCARDIAL INFARCTION, HX OF 09/19/2009   Qualifier: Diagnosis of  By:  TSherren MochaMD, JJory Ee   Neuromuscular disorder ([facility]    Patient reports chronic numbness in the right foot related to previous surgery on the right leg and "nerve damage"   PAF (paroxysmal atrial fibrillation) ([facility]    a. 07/2012: Amio and xarelto initiated.   Right carotid bruit 08/11/2012   SBO (small bowel obstruction) (HCC)    SOB (shortness of breath) 02/26/2018   Type 2 diabetes mellitus with circulatory disorder, without long-term current use of insulin ([place] 10/03/2015   Vitamin D deficiency 02/22/2016    Her Past Surgical History Is Significant For: Past Surgical History:  Procedure Laterality Date   ABDOMINAL HYSTERECTOMY  2000   APPENDECTOMY  1974   BI-VENTRICULAR PACEMAKER INSERTION N/A 01/25/2013   Procedure: BI-VENTRICULAR PACEMAKER INSERTION (CRT-P);  Surgeon: GEvans Lance MD;  [facility];  Service: Cardiovascular;  Laterality: N/A;   BREAST BIOPSY Left 2002   CARDIAC CATHETERIZATION     CHOLECYSTECTOMY OPEN  1974   CORONARY ARTERY BYPASS GRAFT N/A 09/28/2012   Procedure: CORONARY ARTERY BYPASS GRAFTING (CABG);  Surgeon: EGrace Isaac MD;  [place]  Service: Open Heart Surgery;  Laterality: N/A;  CABG x four, using left internal mammary artery and left leg greater saphenous vein harvested endoscopically   DILATION AND CURETTAGE OF UTERUS  1983   EPICARDIAL PACING LEAD PLACEMENT N/A 09/28/2012   Procedure: EPICARDIAL PACING LEAD PLACEMENT;  Surgeon: EGrace Isaac MD;  [place]  Service: Thoracic;  Laterality: N/A;  LV LEAD PLACEMENT   HERNIA REPAIR     INCISIONAL HERNIA REPAIR N/A 05/25/2015   Procedure: LAPAROSCOPIC INCISIONAL HERNIA WITH MESH ;  Surgeon: MRolm Bookbinder MD;  [place] [facility]  Service: General;  Laterality: N/A;   INCONTINENCE SURGERY  2000   INSERTION OF MESH N/A 05/25/2015   Procedure: INSERTION OF MESH;  Surgeon: MRolm Bookbinder MD;  [place]  Service: General;  Laterality: N/A;   INTRAOPERATIVE  TRANSESOPHAGEAL ECHOCARDIOGRAM N/A 09/28/2012   Procedure: INTRAOPERATIVE TRANSESOPHAGEAL ECHOCARDIOGRAM;  Surgeon: EGrace Isaac MD;  [place]  Service: Open Heart Surgery;  Laterality: N/A;   LAPAROSCOPIC INCISIONAL / UMBILICAL / VENTRAL HERNIA REPAIR  05/25/2015   IHR   LEFT HEART CATHETERIZATION WITH CORONARY ANGIOGRAM N/A 07/20/2012   Procedure: LEFT HEART CATHETERIZATION WITH CORONARY ANGIOGRAM;  Surgeon: Peter M JMartinique MD;  [facility];  Service: Cardiovascular;  Laterality: N/A;   MASTECTOMY Right 2002   MASTECTOMY MODIFIED RADICAL W/ AXILLARY LYMPH NODES W/ OR W/O PECTORALIS MINOR Left 2002   MAZE N/A 09/28/2012   Procedure: MAZE;  Surgeon: EGrace Isaac MD;  [place]  Service: Open Heart Surgery;  Laterality: N/A;   ORIF WRIST FRACTURE Right 11/08/2017   Procedure: OPEN REDUCTION INTERNAL FIXATION (ORIF) WRIST FRACTURE;  Surgeon: GRoseanne Kaufman MD;  [place]  Service: Orthopedics;  Laterality: Right;  90 mins   PPM GENERATOR CHANGEOUT N/A 04/17/2020   Procedure: PPM GENERATOR CHANGEOUT;  Surgeon: CSanda Klein MD;  [facility];  Service: Cardiovascular;  Laterality: N/A;  TENDON REPAIR Right 2001 X 3-4   torn ligaments and tendons in ankle up to knee from work related accident   [place]    Her Family History Is Significant For: Family History  Problem Relation Age of Onset   Kidney failure Mother    Heart disease Mother        Died mid 14N complications of diabetes   Diabetes Mother    Colon cancer Sister    Arthritis Other    Diabetes Other    Hyperlipidemia Other    Ovarian cancer Other    Liver cancer Brother    Stomach cancer Neg Hx    Rectal cancer Neg Hx    Esophageal cancer Neg Hx    Pancreatic cancer Neg Hx     Her Social History Is Significant For: Social History   Socioeconomic History   Marital status: Married    Spouse name: Not on file   Number of children: 3   Years of  education: Not on file   Highest education level: Associate degree: occupational, Hotel manager, or vocational program  Occupational History   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 [employer].  Tobacco Use   Smoking status: Former    Packs/day: 1.00    Years: 10.00    Total pack years: 10.00    Types: Cigarettes    Quit date: 03/11/2012    Years since quitting: 9.7   Smokeless tobacco: Never  Vaping Use   Vaping Use: Never used  Substance and Sexual Activity   Alcohol use: No   Drug use: No   Sexual activity: Yes    Partners: Male    Birth control/protection: Surgical  Other Topics Concern   Not on file  Social History Narrative   Lives with husband and mother in [place].  Regular exercise: walking. Caffeine use: 2 cups of coffee in the morning (on weekends).       Working: PT (school (afterschool).    Social Determinants of Health   Financial Resource Strain: Medium Risk (02/06/2021)   Overall Financial Resource Strain (CARDIA)    Difficulty of Paying Living Expenses: Somewhat hard  Food Insecurity: Food Insecurity Present (02/06/2021)   Hunger Vital Sign    Worried About Running Out of Food in the Last Year: Sometimes true    Ran Out of Food in the Last Year: Never true  Transportation Needs: No Transportation Needs (02/06/2021)   PRAPARE - Hydrologist (Medical): No    Lack of Transportation (Non-Medical): No  Physical Activity: Sufficiently Active (02/06/2021)   Exercise Vital Sign    Days of Exercise per Week: 5 days    Minutes of Exercise per Session: 30 min  Stress: No Stress Concern Present (02/06/2021)   [place]    Feeling of Stress : Only a little  Social Connections: Socially Integrated (02/06/2021)   Social Connection and Isolation Panel [NHANES]    Frequency of Communication with Friends and Family: More than three times a week    Frequency of Social  Gatherings with Friends and Family: More than three times a week    Attends Religious Services: More than 4 times per year    Active Member of [employer] or Organizations: Yes    Attends Music therapist: More than 4 times per year    Marital Status: Married    Her Allergies Are:  Allergies  Allergen Reactions  Statins Itching    Myalgias with rosuvastatin, atorvastatin and simvastatin   :   Her Current Medications Are:  Outpatient Encounter Medications as of 11/27/2021  Medication Sig   APPLE CIDER VINEGAR PO Take 5,220 mg by mouth in the morning, at noon, and at bedtime. 1740 mg each   Blood Glucose Monitoring Suppl (TRUE METRIX AIR GLUCOSE METER) DEVI Check blood sugar 2 times a day   carvedilol (COREG) 12.5 MG tablet Take 25 mg (2 x 12.5 mg tabs) twice daily.   Cholecalciferol (VITAMIN D) 50 MCG (2000 UT) CAPS Take 1 capsule (2,000 Units total) by mouth daily.   furosemide (LASIX) 20 MG tablet TAKE 1 TABLET EVERY DAY   glipiZIDE (GLUCOTROL) 5 MG tablet TAKE 1 TABLET TWICE DAILY  BEFORE  MEALS   glucose blood (TRUE METRIX BLOOD GLUCOSE TEST) test strip Use to check blood sugar 2 times a day.   isosorbide mononitrate (IMDUR) 60 MG 24 hr tablet TAKE 1 TABLET EVERY DAY   JARDIANCE 25 MG TABS tablet TAKE 1 TABLET EVERY DAY BEFORE BREAKFAST   losartan (COZAAR) 25 MG tablet TAKE 1 TABLET TWICE DAILY   metFORMIN (GLUCOPHAGE) 1000 MG tablet TAKE 1 TABLET TWICE DAILY WITH FOOD   nitroGLYCERIN (NITROSTAT) 0.4 MG SL tablet Place 1 tablet (0.4 mg total) under the tongue every 5 (five) minutes as needed for chest pain.   REPATHA SURECLICK 808 MG/ML SOAJ INJECT 140 MG INTO THE SKIN EVERY 14 (FOURTEEN) DAYS.   TRUEplus Lancets 33G MISC Use to check blood sugar 2 times a day.   TRULICITY 1.5 UP/1.0RP SOPN INJECT 1.'5MG'$  (1 PEN) UNDER THE SKIN EVERY WEEK   XARELTO 20 MG TABS tablet TAKE 1 TABLET (20 MG TOTAL) BY MOUTH DAILY WITH SUPPER.   [DISCONTINUED] cyclobenzaprine (FLEXERIL) 5 MG tablet  Take 1 tablet (5 mg total) by mouth 2 (two) times daily as needed for muscle spasms.   No facility-administered encounter medications on file as of 11/27/2021.  :   Review of Systems:  Out of a complete 14 point review of systems, all are reviewed and negative with the exception of these symptoms as listed below:  Review of Systems  Neurological:        Hard time getting up in the AM, not sleeping well, snoring.  Had Vitamin D deficiency and has noted improvement with supplement.  ESS  7  FSS 36.     Objective:  Neurological Exam  Physical Exam Physical Examination:   Vitals:   11/27/21 0854  BP: 111/68  Pulse: 84    General Examination: The patient is a very pleasant 71 y.o. female in no acute distress. She appears well-developed and well-nourished and well groomed.   HEENT: Normocephalic, atraumatic, pupils are equal, round and reactive to light, extraocular tracking is good without limitation to gaze excursion or nystagmus noted. Hearing is grossly intact. Face is symmetric with normal facial animation. Speech is clear with no dysarthria noted. There is no hypophonia. There is no lip, neck/head, jaw or voice tremor. Neck is supple with full range of passive and active motion. There are no carotid bruits on auscultation. Oropharynx exam reveals: mild mouth dryness, adequate dental hygiene and moderate airway crowding, due to small airway entry, redundant soft palate.  Wider uvula noted but uvula tip and tonsils not fully visualized.  Mallampati class IV.  Neck circumference 14-1/2 inches.  Moderate overbite.  Tongue protrudes centrally. \ Chest: Clear to auscultation without wheezing, rhonchi or crackles noted.  Heart: S1+S2+0,  regular and normal without murmurs, rubs or gallops noted.   Abdomen: Soft, non-tender and non-distended.  Extremities: There is no pitting edema in the distal lower extremities bilaterally.   Skin: Warm and dry without trophic changes noted.    Musculoskeletal: exam reveals no obvious joint deformities, tenderness or joint swelling or erythema.   Neurologically:  Mental status: The patient is awake, alert and oriented in all 4 spheres. Her immediate and remote memory, attention, language skills and fund of knowledge are appropriate. There is no evidence of aphasia, agnosia, apraxia or anomia. Speech is clear with normal prosody and enunciation. Thought process is linear. Mood is normal and affect is normal.  Cranial nerves II - XII are as described above under HEENT exam.  Motor exam: Normal bulk, strength and tone is noted. There is no obvious tremor.  Fine motor skills and coordination: grossly intact.  Cerebellar testing: No dysmetria or intention tremor. There is no truncal or gait ataxia.  Sensory exam: intact to light touch in the upper and lower extremities.  Gait, station and balance: She stands easily. No veering to one side is noted. No leaning to one side is noted. Posture is age-appropriate and stance is narrow based. Gait shows normal stride length and normal pace. No problems turning are noted.                Assessment and Plan:  In summary, [name] is a very pleasant 71 y.o.-year old female with an underlying complex medical history of coronary artery disease with history of MI, ischemic cardiomyopathy, left bundle branch block, paroxysmal A-fib, status post CABG, status post AICD placement, chronic systolic and diastolic congestive heart failure, hypertension, hyperlipidemia, diabetes, allergic rhinitis, breast cancer, small bowel obstruction, vitamin D deficiency, borderline overweight state, status post multiple surgeries including bilateral mastectomies, hernia repair, tubal ligation, CABG x4, AICD placement, whose history and physical exam are concerning for sleep disordered breathing, supporting a current working diagnosis of unspecified sleep apnea, with the main differential diagnoses of obstructive sleep apnea  (OSA) versus upper airway resistance syndrome (UARS) versus central sleep apnea (CSA), or mixed sleep apnea. A laboratory attended sleep study is considered gold standard for evaluation of sleep disordered breathing and is recommended at this time and clinically justified.   I had a long chat with the patient about my findings and the diagnosis of sleep apnea, particularly OSA, its prognosis and treatment options. We talked about medical/conservative treatments, surgical interventions and non-pharmacological approaches for symptom control. I explained, in particular, the risks and ramifications of untreated moderate to severe OSA, especially with respect to developing cardiovascular disease down the road, including congestive heart failure (CHF), difficult to treat hypertension, cardiac arrhythmias (particularly A-fib), neurovascular complications including TIA, stroke and dementia. Even type 2 diabetes has, in part, been linked to untreated OSA. Symptoms of untreated OSA may include (but may not be limited to) daytime sleepiness, nocturia (i.e. frequent nighttime urination), memory problems, mood irritability and suboptimally controlled or worsening mood disorder such as depression and/or anxiety, lack of energy, lack of motivation, physical discomfort, as well as recurrent headaches, especially morning or nocturnal headaches. We talked about the importance of maintaining a healthy lifestyle and striving for healthy weight. In addition, we talked about the importance of striving for and maintaining good sleep hygiene. I recommended the following at this time: sleep study.  I outlined the differences between a laboratory attended sleep study which is considered more comprehensive and accurate over the option of a  home sleep test (HST); the latter may lead to underestimation of sleep disordered breathing in some instances and does not help with diagnosing upper airway resistance syndrome and is not accurate enough  to diagnose primary central sleep apnea typically. I explained the different sleep test procedures to the patient in detail and also outlined possible surgical and non-surgical treatment options of OSA, including the use of a pressure airway pressure (PAP) device (ie CPAP, AutoPAP/APAP or BiPAP in certain circumstances), a custom-made dental device (aka oral appliance, which would require a referral to a specialist dentist or orthodontist typically, and is generally speaking not considered a good choice for patients with full dentures or edentulous state), upper airway surgical options, such as traditional UPPP (which is not considered a first-line treatment) or the Inspire device (hypoglossal nerve stimulator, which would involve a referral for consultation with an ENT surgeon, after careful selection, following inclusion criteria). I explained the PAP treatment option to the patient in detail, as this is generally considered first-line treatment.  The patient indicated that she would be willing to try PAP therapy, if the need arises. I explained the importance of being compliant with PAP treatment, not only for insurance purposes but primarily to improve patient's symptoms symptoms, and for the patient's long term health benefit, including to reduce Her cardiovascular risks longer-term.    We will pick up our discussion about the next steps and treatment options after testing.  We will keep her posted as to the test results by phone call and/or MyChart messaging where possible.  We will plan to follow-up in sleep clinic accordingly as well.  I answered all her questions today and the patient was in agreement.   I encouraged her to call with any interim questions, concerns, problems or updates or email [place] through [place].  Generally speaking, sleep test authorizations may take up to 2 weeks, sometimes less, sometimes longer, the patient is encouraged to get in touch with [place] if they do not hear back from the  sleep lab staff directly within the next 2 weeks.  Thank you very much for allowing me to participate in the care of this nice patient. If I can be of any further assistance to you please do not hesitate to call me at [phone].  Sincerely,   Star Age, MD, PhD

## 2021-11-29 ENCOUNTER — Encounter: Payer: Self-pay | Admitting: Internal Medicine

## 2021-11-29 ENCOUNTER — Ambulatory Visit (INDEPENDENT_AMBULATORY_CARE_PROVIDER_SITE_OTHER): Payer: Medicare HMO | Admitting: Internal Medicine

## 2021-11-29 VITALS — BP 128/80 | HR 90 | Temp 98.2°F | Ht 64.0 in | Wt 150.0 lb

## 2021-11-29 DIAGNOSIS — R519 Headache, unspecified: Secondary | ICD-10-CM

## 2021-11-29 DIAGNOSIS — M62838 Other muscle spasm: Secondary | ICD-10-CM

## 2021-11-29 MED ORDER — KETOROLAC TROMETHAMINE 60 MG/2ML IM SOLN
60.0000 mg | Freq: Once | INTRAMUSCULAR | Status: AC
  Administered 2021-11-29: 60 mg via INTRAMUSCULAR
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
## 2021-12-04 ENCOUNTER — Ambulatory Visit: Payer: Medicare HMO | Admitting: Physical Therapy

## 2021-12-05 ENCOUNTER — Encounter (INDEPENDENT_AMBULATORY_CARE_PROVIDER_SITE_OTHER): Payer: Medicare HMO

## 2021-12-11 ENCOUNTER — Telehealth: Payer: Self-pay | Admitting: *Deleted

## 2021-12-11 ENCOUNTER — Ambulatory Visit: Payer: Medicare HMO | Attending: Internal Medicine | Admitting: Physical Therapy

## 2021-12-11 ENCOUNTER — Encounter: Payer: Self-pay | Admitting: Neurology

## 2021-12-11 ENCOUNTER — Encounter: Payer: Self-pay | Admitting: Physical Therapy

## 2021-12-11 ENCOUNTER — Other Ambulatory Visit: Payer: Self-pay

## 2021-12-11 ENCOUNTER — Telehealth: Payer: Self-pay | Admitting: Neurology

## 2021-12-11 DIAGNOSIS — R519 Headache, unspecified: Secondary | ICD-10-CM | POA: Insufficient documentation

## 2021-12-11 DIAGNOSIS — R2689 Other abnormalities of gait and mobility: Secondary | ICD-10-CM | POA: Insufficient documentation

## 2021-12-11 DIAGNOSIS — M542 Cervicalgia: Secondary | ICD-10-CM | POA: Insufficient documentation

## 2021-12-11 DIAGNOSIS — M6281 Muscle weakness (generalized): Secondary | ICD-10-CM | POA: Insufficient documentation

## 2021-12-11 DIAGNOSIS — R293 Abnormal posture: Secondary | ICD-10-CM | POA: Insufficient documentation

## 2021-12-11 DIAGNOSIS — R2681 Unsteadiness on feet: Secondary | ICD-10-CM | POA: Insufficient documentation

## 2021-12-11 DIAGNOSIS — M62838 Other muscle spasm: Secondary | ICD-10-CM | POA: Diagnosis not present
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
## 2021-12-11 NOTE — Patient Outreach (Signed)
  Care Coordination   12/11/2021 Name: [name] MRN: [id number] DOB: December 06, 1950   Care Coordination Outreach Attempts:  A second unsuccessful outreach was attempted today to offer the patient with information about available care coordination services as a benefit of their health plan.     Follow Up Plan:  Additional outreach attempts will be made to offer the patient care coordination information and services.   Encounter Outcome:  No Answer  Care Coordination Interventions Activated:  No   Care Coordination Interventions:  No, not indicated    Raina Mina, RN Care Management Coordinator [place] Office [phone]

## 2021-12-11 NOTE — Telephone Encounter (Signed)
[employer] pending uploaded notes  
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
## 2021-12-13 ENCOUNTER — Encounter: Payer: Self-pay | Admitting: Physical Therapy

## 2021-12-13 ENCOUNTER — Ambulatory Visit: Payer: Medicare HMO | Admitting: Physical Therapy

## 2021-12-13 DIAGNOSIS — M6281 Muscle weakness (generalized): Secondary | ICD-10-CM

## 2021-12-13 DIAGNOSIS — R519 Headache, unspecified: Secondary | ICD-10-CM | POA: Diagnosis not present

## 2021-12-13 DIAGNOSIS — R2681 Unsteadiness on feet: Secondary | ICD-10-CM | POA: Diagnosis not present

## 2021-12-13 DIAGNOSIS — M62838 Other muscle spasm: Secondary | ICD-10-CM | POA: Diagnosis not present

## 2021-12-13 DIAGNOSIS — R293 Abnormal posture: Secondary | ICD-10-CM

## 2021-12-13 DIAGNOSIS — M542 Cervicalgia: Secondary | ICD-10-CM

## 2021-12-13 DIAGNOSIS — R2689 Other abnormalities of gait and mobility: Secondary | ICD-10-CM | POA: Diagnosis not present

## 2021-12-13 NOTE — Patient Instructions (Signed)

## 2021-12-14 ENCOUNTER — Ambulatory Visit (INDEPENDENT_AMBULATORY_CARE_PROVIDER_SITE_OTHER): Payer: Medicare HMO

## 2021-12-14 DIAGNOSIS — I739 Peripheral vascular disease, unspecified: Secondary | ICD-10-CM | POA: Diagnosis not present
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
## 2021-12-18 ENCOUNTER — Encounter: Payer: Self-pay | Admitting: Physical Therapy

## 2021-12-18 ENCOUNTER — Ambulatory Visit: Payer: Medicare HMO | Admitting: Physical Therapy

## 2021-12-18 DIAGNOSIS — R2689 Other abnormalities of gait and mobility: Secondary | ICD-10-CM | POA: Diagnosis not present

## 2021-12-18 DIAGNOSIS — R2681 Unsteadiness on feet: Secondary | ICD-10-CM

## 2021-12-18 DIAGNOSIS — M6281 Muscle weakness (generalized): Secondary | ICD-10-CM

## 2021-12-18 DIAGNOSIS — R519 Headache, unspecified: Secondary | ICD-10-CM | POA: Diagnosis not present

## 2021-12-18 DIAGNOSIS — M542 Cervicalgia: Secondary | ICD-10-CM

## 2021-12-18 DIAGNOSIS — R293 Abnormal posture: Secondary | ICD-10-CM | POA: Diagnosis not present

## 2021-12-18 DIAGNOSIS — M62838 Other muscle spasm: Secondary | ICD-10-CM | POA: Diagnosis not present

## 2021-12-20 ENCOUNTER — Encounter: Payer: Self-pay | Admitting: Physical Therapy

## 2021-12-20 ENCOUNTER — Ambulatory Visit: Payer: Medicare HMO | Admitting: Physical Therapy

## 2021-12-20 DIAGNOSIS — M62838 Other muscle spasm: Secondary | ICD-10-CM | POA: Diagnosis not present

## 2021-12-20 DIAGNOSIS — R2681 Unsteadiness on feet: Secondary | ICD-10-CM

## 2021-12-20 DIAGNOSIS — R2689 Other abnormalities of gait and mobility: Secondary | ICD-10-CM

## 2021-12-20 DIAGNOSIS — R519 Headache, unspecified: Secondary | ICD-10-CM | POA: Diagnosis not present

## 2021-12-20 DIAGNOSIS — M542 Cervicalgia: Secondary | ICD-10-CM | POA: Diagnosis not present

## 2021-12-20 DIAGNOSIS — M6281 Muscle weakness (generalized): Secondary | ICD-10-CM

## 2021-12-20 DIAGNOSIS — R293 Abnormal posture: Secondary | ICD-10-CM | POA: Diagnosis not present
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
## 2021-12-21 ENCOUNTER — Telehealth: Payer: Self-pay | Admitting: Neurology

## 2021-12-21 NOTE — Telephone Encounter (Signed)
[employer] Olin Hauser) Pt called [place] about her sleep study authorization. Authorization for sleep study was approved on 12/11/21-03/11/2022. If do not see the authorization can contact us. Can contact the patient to schedule sleep study. Authorization no: [id number].

## 2021-12-24 ENCOUNTER — Telehealth: Payer: Self-pay

## 2021-12-24 NOTE — Telephone Encounter (Signed)
NPSG- [employer] Josem Kaufmann: [phone] (exp. 12/11/21 to 03/11/22).  Patient is scheduled at [facility] for 01/21/22 at 8 pm.  Mailed packet to the patient.

## 2021-12-24 NOTE — Telephone Encounter (Signed)
NPSG- [employer] Josem Kaufmann: [id number] (exp. 12/11/21 to 03/11/22).  Patient is scheduled at [facility] for 01/21/22 at 8 pm.  Mailed packet to the patient.

## 2021-12-24 NOTE — Patient Outreach (Signed)
  Care Coordination   12/24/2021 Name: [name] MRN: [id number] DOB: 02-28-1951   Care Coordination Outreach Attempts:  A third unsuccessful outreach was attempted today to offer the patient with information about available care coordination services as a benefit of their health plan.   Follow Up Plan:  No further outreach attempts will be made at this time. We have been unable to contact the patient to offer or enroll patient in care coordination services  Encounter Outcome:  No Answer  Care Coordination Interventions Activated:  No   Care Coordination Interventions:  No, not indicated    Peter Garter RN, BSN,CCM, [place] [facility] [phone]
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
## 2021-12-25 ENCOUNTER — Ambulatory Visit: Payer: Medicare HMO | Admitting: Physical Therapy

## 2021-12-25 ENCOUNTER — Encounter: Payer: Self-pay | Admitting: Physical Therapy

## 2021-12-25 DIAGNOSIS — M542 Cervicalgia: Secondary | ICD-10-CM

## 2021-12-25 DIAGNOSIS — M6281 Muscle weakness (generalized): Secondary | ICD-10-CM | POA: Diagnosis not present

## 2021-12-25 DIAGNOSIS — R293 Abnormal posture: Secondary | ICD-10-CM | POA: Diagnosis not present

## 2021-12-25 DIAGNOSIS — R519 Headache, unspecified: Secondary | ICD-10-CM | POA: Diagnosis not present

## 2021-12-25 DIAGNOSIS — R2689 Other abnormalities of gait and mobility: Secondary | ICD-10-CM

## 2021-12-25 DIAGNOSIS — M62838 Other muscle spasm: Secondary | ICD-10-CM | POA: Diagnosis not present

## 2021-12-25 DIAGNOSIS — R2681 Unsteadiness on feet: Secondary | ICD-10-CM | POA: Diagnosis not present
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
## 2021-12-26 NOTE — Telephone Encounter (Signed)
Patient NPSG is scheduled at [facility] for 01/21/22.

## 2021-12-27 ENCOUNTER — Encounter: Payer: Self-pay | Admitting: Physical Therapy

## 2021-12-27 ENCOUNTER — Ambulatory Visit: Payer: Medicare HMO | Admitting: Physical Therapy

## 2021-12-27 ENCOUNTER — Encounter: Payer: Self-pay | Admitting: Internal Medicine

## 2021-12-27 ENCOUNTER — Other Ambulatory Visit: Payer: Self-pay | Admitting: Internal Medicine

## 2021-12-27 DIAGNOSIS — M6281 Muscle weakness (generalized): Secondary | ICD-10-CM | POA: Diagnosis not present

## 2021-12-27 DIAGNOSIS — R2689 Other abnormalities of gait and mobility: Secondary | ICD-10-CM

## 2021-12-27 DIAGNOSIS — R293 Abnormal posture: Secondary | ICD-10-CM

## 2021-12-27 DIAGNOSIS — M542 Cervicalgia: Secondary | ICD-10-CM | POA: Diagnosis not present

## 2021-12-27 DIAGNOSIS — R2681 Unsteadiness on feet: Secondary | ICD-10-CM

## 2021-12-27 DIAGNOSIS — R519 Headache, unspecified: Secondary | ICD-10-CM | POA: Diagnosis not present

## 2021-12-27 DIAGNOSIS — M549 Dorsalgia, unspecified: Secondary | ICD-10-CM

## 2021-12-27 DIAGNOSIS — M62838 Other muscle spasm: Secondary | ICD-10-CM | POA: Diagnosis not present
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
## 2022-01-01 ENCOUNTER — Ambulatory Visit: Payer: Medicare HMO | Admitting: Physical Therapy

## 2022-01-01 ENCOUNTER — Telehealth: Payer: Self-pay

## 2022-01-01 ENCOUNTER — Encounter: Payer: Self-pay | Admitting: Physical Therapy

## 2022-01-01 DIAGNOSIS — R2681 Unsteadiness on feet: Secondary | ICD-10-CM | POA: Diagnosis not present

## 2022-01-01 DIAGNOSIS — M6281 Muscle weakness (generalized): Secondary | ICD-10-CM

## 2022-01-01 DIAGNOSIS — R519 Headache, unspecified: Secondary | ICD-10-CM | POA: Diagnosis not present

## 2022-01-01 DIAGNOSIS — M62838 Other muscle spasm: Secondary | ICD-10-CM | POA: Diagnosis not present

## 2022-01-01 DIAGNOSIS — R2689 Other abnormalities of gait and mobility: Secondary | ICD-10-CM

## 2022-01-01 DIAGNOSIS — R293 Abnormal posture: Secondary | ICD-10-CM

## 2022-01-01 DIAGNOSIS — M542 Cervicalgia: Secondary | ICD-10-CM

## 2022-01-01 NOTE — Telephone Encounter (Signed)
On Repatha with documented statin intolerance. No upcoming appt to request coding.   Per last OV 10/02/21: Return in about 3 months (around 01/02/2022) for blood pressure, dm. Appt scheduled for 01/16/22

## 2022-01-01 NOTE — Telephone Encounter (Signed)
Patient calling check on progress on this request. Pt aware provider hasn't been in the office
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
## 2022-01-03 ENCOUNTER — Telehealth: Payer: Self-pay | Admitting: Cardiovascular Disease

## 2022-01-03 ENCOUNTER — Telehealth: Payer: Self-pay | Admitting: *Deleted

## 2022-01-03 ENCOUNTER — Ambulatory Visit: Payer: Medicare HMO | Admitting: Physical Therapy

## 2022-01-03 NOTE — Telephone Encounter (Signed)
Patient was returning phone call about medical clearence

## 2022-01-03 NOTE — Telephone Encounter (Signed)
Follow Up:    Patient is checking on the status of her clearance. SHe dropped off the papers on last Friday(12-28-21)

## 2022-01-03 NOTE — Telephone Encounter (Signed)
I left a message for the pt to call back to discuss about clearance form she dropped off she states 12/28/21 to [facility] office for Dr. Sallyanne Kuster. I do not see that any clearance has been entered in the chart yet.

## 2022-01-03 NOTE — Telephone Encounter (Signed)
   Patient Name: Britainy Kozub  DOB: 07/18/1950 MRN: [id number]  Primary Cardiologist: [name], MD  Chart reviewed as part of pre-operative protocol coverage.    Simple dental extractions (i.e. 1-2 teeth) are considered low risk procedures per guidelines and generally do not require any specific cardiac clearance. It is also generally accepted that for simple extractions and dental cleanings, there is no need to interrupt blood thinner therapy.   SBE prophylaxis is not required for the patient from a cardiac standpoint.  I will route this recommendation to the requesting party via Epic fax function and remove from pre-op pool.  Please call with questions.  Loel Dubonnet, NP 01/03/2022, 4:51 PM

## 2022-01-03 NOTE — Telephone Encounter (Signed)
[name], [name] 38 minutes ago (2:18 PM)   PP Patient was returning phone call about medical clearence         Note

## 2022-01-03 NOTE — Telephone Encounter (Signed)
I s/w the pt and informed her that I did not see a clearance request. I stated that I will be happy to help and if she tells me who is doing her procedure I will be happy to call the office. Pt states Dr. Monica Martinez, DDS with [facility] ph# [phone] fax# [phone].   I called the DDS office and was bale to obtain all needed information.     Pre-operative Risk Assessment    Patient Name: [name]  DOB: April 15, 1950 MRN: [id number]     Request for Surgical Clearance    Procedure:  Dental Extraction - Amount of Teeth to be Pulled:  2 MOLAR EXTRACTIONS (SIMPLE) PER REQUESTING OFFICE  Date of Surgery:  Clearance TBD                                 Surgeon:  DR. Monica Martinez, DDS Surgeon's Group or Practice Name:  [facility] Phone number:  [phone] Fax number:  [phone]   Type of Clearance Requested:   - Medical  - Pharmacy:  Hold Rivaroxaban (Xarelto)     Type of Anesthesia:  Local    Additional requests/questions:    Jiles Prows   01/03/2022, 4:41 PM
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
## 2022-01-07 ENCOUNTER — Telehealth: Payer: Self-pay

## 2022-01-07 ENCOUNTER — Encounter (INDEPENDENT_AMBULATORY_CARE_PROVIDER_SITE_OTHER): Payer: Self-pay

## 2022-01-07 NOTE — Telephone Encounter (Signed)
Pre-operative Risk Assessment    Patient Name: [name]  DOB: [birth year] MRN: [id number]      Request for Surgical Clearance    Procedure:   DENTAL CLEANING /SCALING & ROOT PLANING  Date of Surgery:  Clearance TBD                                 Surgeon:  DR Iva Lento Surgeon's Group or Practice Name:  [facility] Phone number:  [phone] Fax number:  [phone]   Type of Clearance Requested:   - Medical  PACE MAKER HBP ,DIABETES, BLOOD THINNER   Type of Anesthesia:  Not Indicated   Additional requests/questions:  Please fax a copy of MEDICAL CLEARANCE FOR DENTAL FAX TO [phone] to the surgeon's office.  Signed, Tera Helper Kristen Fromm   01/07/2022, 4:50 PM

## 2022-01-08 ENCOUNTER — Ambulatory Visit: Payer: Medicare HMO | Admitting: Physical Therapy

## 2022-01-08 ENCOUNTER — Encounter: Payer: Self-pay | Admitting: Physical Therapy

## 2022-01-08 DIAGNOSIS — R2681 Unsteadiness on feet: Secondary | ICD-10-CM

## 2022-01-08 DIAGNOSIS — R519 Headache, unspecified: Secondary | ICD-10-CM | POA: Diagnosis not present

## 2022-01-08 DIAGNOSIS — M6281 Muscle weakness (generalized): Secondary | ICD-10-CM

## 2022-01-08 DIAGNOSIS — M542 Cervicalgia: Secondary | ICD-10-CM | POA: Diagnosis not present

## 2022-01-08 DIAGNOSIS — R293 Abnormal posture: Secondary | ICD-10-CM

## 2022-01-08 DIAGNOSIS — M62838 Other muscle spasm: Secondary | ICD-10-CM | POA: Diagnosis not present

## 2022-01-08 DIAGNOSIS — R2689 Other abnormalities of gait and mobility: Secondary | ICD-10-CM | POA: Diagnosis not present

## 2022-01-08 NOTE — Telephone Encounter (Signed)
   Name: [name]  DOB: [birth year]  MRN: [id number]  Primary Cardiologist: [name], MD  Chart reviewed as part of pre-operative protocol coverage. Because of Clotiel Meding's past medical history and time since last visit, she will require a follow-up telephone visit in order to better assess preoperative cardiovascular risk.  Pre-op covering staff: - Please schedule appointment and call patient to inform them. If patient already had an upcoming appointment within acceptable timeframe, please add "pre-op clearance" to the appointment notes so provider is aware. - Please contact requesting surgeon's office via preferred method (i.e, phone, fax) to inform them of need for appointment prior to surgery.  This message will also be routed to pharmacy pool and/or Dr Sallyanne Kuster or input on holding Xarelto as requested below so that this information is available to the clearing provider at time of patient's appointment.   CHA2DS2-VASc Score = 6  This indicates a 9.7% annual risk of stroke. The patient's score is based upon: CHF History: 1 HTN History: 1 Diabetes History: 1 Stroke History: 0 Vascular Disease History: 1 Age Score: 1 Gender Score: 1   CrCl: 78 mL/min   Finis Bud, NP  01/08/2022, 9:06 AM

## 2022-01-08 NOTE — Telephone Encounter (Signed)
No need to hold Xarelto also does not require any prophylactic antibiotics

## 2022-01-10 ENCOUNTER — Encounter: Payer: Self-pay | Admitting: Physical Therapy

## 2022-01-10 ENCOUNTER — Ambulatory Visit: Payer: Medicare HMO | Attending: Internal Medicine | Admitting: Physical Therapy

## 2022-01-10 DIAGNOSIS — R2689 Other abnormalities of gait and mobility: Secondary | ICD-10-CM | POA: Insufficient documentation

## 2022-01-10 DIAGNOSIS — M6281 Muscle weakness (generalized): Secondary | ICD-10-CM | POA: Insufficient documentation

## 2022-01-10 DIAGNOSIS — R2681 Unsteadiness on feet: Secondary | ICD-10-CM | POA: Diagnosis not present

## 2022-01-10 DIAGNOSIS — M542 Cervicalgia: Secondary | ICD-10-CM | POA: Diagnosis not present

## 2022-01-10 DIAGNOSIS — R293 Abnormal posture: Secondary | ICD-10-CM | POA: Insufficient documentation
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
## 2022-01-10 NOTE — Telephone Encounter (Signed)
   Patient Name: [name]  DOB: 1950/09/10 MRN: [id number]  Primary Cardiologist: [name], MD  Chart reviewed as part of pre-operative protocol coverage.   Simple dental extractions (i.e. 1-2 teeth), and cleanings are considered low risk procedures per guidelines and generally do not require any specific cardiac clearance. It is also generally accepted that for simple extractions and dental cleanings, there is no need to interrupt blood thinner therapy.   SBE prophylaxis is not required for the patient from a cardiac standpoint.  I will route this recommendation to the requesting party via Epic fax function and remove from pre-op pool.  Please call with questions.  Lenna Sciara, NP 01/10/2022, 8:54 AM

## 2022-01-14 ENCOUNTER — Encounter: Payer: Self-pay | Admitting: Physical Therapy

## 2022-01-14 ENCOUNTER — Ambulatory Visit: Payer: Medicare HMO | Admitting: Physical Therapy

## 2022-01-14 DIAGNOSIS — R2681 Unsteadiness on feet: Secondary | ICD-10-CM | POA: Diagnosis not present

## 2022-01-14 DIAGNOSIS — M6281 Muscle weakness (generalized): Secondary | ICD-10-CM

## 2022-01-14 DIAGNOSIS — M542 Cervicalgia: Secondary | ICD-10-CM

## 2022-01-14 DIAGNOSIS — R293 Abnormal posture: Secondary | ICD-10-CM

## 2022-01-14 DIAGNOSIS — R2689 Other abnormalities of gait and mobility: Secondary | ICD-10-CM | POA: Diagnosis not present
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
## 2022-01-15 ENCOUNTER — Encounter: Payer: Self-pay | Admitting: Pharmacist

## 2022-01-15 DIAGNOSIS — T466X5A Adverse effect of antihyperlipidemic and antiarteriosclerotic drugs, initial encounter: Secondary | ICD-10-CM

## 2022-01-15 NOTE — Progress Notes (Signed)
[facility] [place] [facility])                                            [place] Team                                        Statin Quality Measure Assessment    01/15/2022  [name] [birth year] [id number]    Per review of chart and payor information, patient has a diagnosis of diabetes but is not currently filling a statin prescription.  This places patient into the SUPD (Statin Use In Patients with Diabetes) measure for CMS.    Patient has documented trials of statins with reported myopathy, but no corresponding CPT codes that would exclude patient from SUPD measure. Patient is on [place]. Associating a statin exclusion code would remove the patient from the measure if deemed therapeutically appropriate.  The ASCVD Risk score (Arnett DK, et al., 2019) failed to calculate for the following reasons:   The patient has a prior MI or stroke diagnosis 07/31/2020     Component Value Date/Time   CHOL 135 07/31/2020 0917   TRIG 157 (H) 07/31/2020 0917   HDL 61 07/31/2020 0917   CHOLHDL 2.2 07/31/2020 0917   CHOLHDL 5.2 (H) 04/04/2016 0723   VLDL NOT CALC 04/04/2016 0723   LDLCALC 48 07/31/2020 0917   LDLDIRECT 50 04/04/2016 0723    Please consider ONE of the following recommendations:  Initiate high intensity statin Atorvastatin '40mg'$  once daily, #90, 3 refills   Rosuvastatin '20mg'$  once daily, #90, 3 refills    Initiate moderate intensity          statin with reduced frequency if prior          statin intolerance 1x weekly, #13, 3 refills   2x weekly, #26, 3 refills   3x weekly, #39, 3 refills    Code for past statin intolerance or  other exclusions (required annually)   Provider Requirements:  Associate code during an office visit or telehealth encounter  Drug Induced Myopathy G72.0   Myopathy, unspecified G72.9   Myositis, unspecified M60.9   Rhabdomyolysis E32.12   Alcoholic fatty liver Y48.2   Cirrhosis of liver  K74.69   Prediabetes R73.03   PCOS E28.2   Toxic liver disease, unspecified K71.9        Plan: Route note to PCP prior to upcoming appointment.  Elayne Guerin, PharmD, [place] Clinical Pharmacist [phone]
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
## 2022-01-16 ENCOUNTER — Ambulatory Visit (INDEPENDENT_AMBULATORY_CARE_PROVIDER_SITE_OTHER): Payer: Medicare HMO | Admitting: Internal Medicine

## 2022-01-16 ENCOUNTER — Encounter: Payer: Self-pay | Admitting: Internal Medicine

## 2022-01-16 ENCOUNTER — Ambulatory Visit: Payer: Medicare HMO | Admitting: Physical Therapy

## 2022-01-16 ENCOUNTER — Encounter: Payer: Self-pay | Admitting: Physical Therapy

## 2022-01-16 VITALS — BP 128/79 | HR 79 | Temp 98.4°F | Wt 151.7 lb

## 2022-01-16 DIAGNOSIS — Z789 Other specified health status: Secondary | ICD-10-CM | POA: Diagnosis not present

## 2022-01-16 DIAGNOSIS — R2681 Unsteadiness on feet: Secondary | ICD-10-CM | POA: Diagnosis not present

## 2022-01-16 DIAGNOSIS — R2689 Other abnormalities of gait and mobility: Secondary | ICD-10-CM

## 2022-01-16 DIAGNOSIS — M6281 Muscle weakness (generalized): Secondary | ICD-10-CM

## 2022-01-16 DIAGNOSIS — I25810 Atherosclerosis of coronary artery bypass graft(s) without angina pectoris: Secondary | ICD-10-CM | POA: Diagnosis not present

## 2022-01-16 DIAGNOSIS — M542 Cervicalgia: Secondary | ICD-10-CM

## 2022-01-16 DIAGNOSIS — I10 Essential (primary) hypertension: Secondary | ICD-10-CM

## 2022-01-16 DIAGNOSIS — E1159 Type 2 diabetes mellitus with other circulatory complications: Secondary | ICD-10-CM | POA: Diagnosis not present

## 2022-01-16 DIAGNOSIS — R293 Abnormal posture: Secondary | ICD-10-CM

## 2022-01-16 DIAGNOSIS — E782 Mixed hyperlipidemia: Secondary | ICD-10-CM

## 2022-01-16 LAB — POCT GLYCOSYLATED HEMOGLOBIN (HGB A1C): Hemoglobin A1C: 6.4 % — AB (ref 4.0–5.6)
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
## 2022-01-17 ENCOUNTER — Ambulatory Visit (INDEPENDENT_AMBULATORY_CARE_PROVIDER_SITE_OTHER): Payer: Medicare HMO | Admitting: Orthopedic Surgery

## 2022-01-17 ENCOUNTER — Ambulatory Visit (INDEPENDENT_AMBULATORY_CARE_PROVIDER_SITE_OTHER): Payer: Medicare HMO

## 2022-01-17 ENCOUNTER — Encounter: Payer: Self-pay | Admitting: Orthopedic Surgery

## 2022-01-17 VITALS — BP 121/72 | HR 86 | Ht 64.0 in | Wt 152.0 lb

## 2022-01-17 DIAGNOSIS — M545 Low back pain, unspecified: Secondary | ICD-10-CM | POA: Diagnosis not present

## 2022-01-17 DIAGNOSIS — M5416 Radiculopathy, lumbar region: Secondary | ICD-10-CM

## 2022-01-17 DIAGNOSIS — I442 Atrioventricular block, complete: Secondary | ICD-10-CM

## 2022-01-17 LAB — CUP PACEART REMOTE DEVICE CHECK
Battery Remaining Longevity: 62 mo
Battery Remaining Percentage: 74 %
Battery Voltage: 2.99 V
Brady Statistic AP VP Percent: 1 %
Brady Statistic AP VS Percent: 0 %
Brady Statistic AS VP Percent: 99 %
Brady Statistic AS VS Percent: 1 %
Brady Statistic RA Percent Paced: 1 %
Date Time Interrogation Session: 20231109040021
Implantable Lead Connection Status: 753985
Implantable Lead Connection Status: 753985
Implantable Lead Connection Status: 753985
Implantable Lead Implant Date: 20140721
Implantable Lead Implant Date: 20141117
Implantable Lead Implant Date: 20141117
Implantable Lead Location: 753858
Implantable Lead Location: 753859
Implantable Lead Location: 753860
Implantable Lead Model: 5071
Implantable Pulse Generator Implant Date: 20220207
Lead Channel Impedance Value: 400 Ohm
Lead Channel Impedance Value: 400 Ohm
Lead Channel Impedance Value: 560 Ohm
Lead Channel Pacing Threshold Amplitude: 0.5 V
Lead Channel Pacing Threshold Amplitude: 0.75 V
Lead Channel Pacing Threshold Amplitude: 1.25 V
Lead Channel Pacing Threshold Pulse Width: 0.4 ms
Lead Channel Pacing Threshold Pulse Width: 0.4 ms
Lead Channel Pacing Threshold Pulse Width: 0.5 ms
Lead Channel Sensing Intrinsic Amplitude: 0.6 mV
Lead Channel Sensing Intrinsic Amplitude: 12 mV
Lead Channel Setting Pacing Amplitude: 2 V
Lead Channel Setting Pacing Amplitude: 2.5 V
Lead Channel Setting Pacing Amplitude: 2.5 V
Lead Channel Setting Pacing Pulse Width: 0.4 ms
Lead Channel Setting Pacing Pulse Width: 0.5 ms
Lead Channel Setting Sensing Sensitivity: 2 mV
Pulse Gen Model: 3222
Pulse Gen Serial Number: 3859407
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
## 2022-01-21 ENCOUNTER — Ambulatory Visit (INDEPENDENT_AMBULATORY_CARE_PROVIDER_SITE_OTHER): Payer: Medicare HMO | Admitting: Neurology

## 2022-01-21 DIAGNOSIS — E663 Overweight: Secondary | ICD-10-CM

## 2022-01-21 DIAGNOSIS — I480 Paroxysmal atrial fibrillation: Secondary | ICD-10-CM

## 2022-01-21 DIAGNOSIS — R351 Nocturia: Secondary | ICD-10-CM

## 2022-01-21 DIAGNOSIS — Z9189 Other specified personal risk factors, not elsewhere classified: Secondary | ICD-10-CM

## 2022-01-21 DIAGNOSIS — G4733 Obstructive sleep apnea (adult) (pediatric): Secondary | ICD-10-CM | POA: Diagnosis not present

## 2022-01-21 DIAGNOSIS — Z951 Presence of aortocoronary bypass graft: Secondary | ICD-10-CM

## 2022-01-21 DIAGNOSIS — I5042 Chronic combined systolic (congestive) and diastolic (congestive) heart failure: Secondary | ICD-10-CM

## 2022-01-21 DIAGNOSIS — G4719 Other hypersomnia: Secondary | ICD-10-CM

## 2022-01-21 DIAGNOSIS — R0683 Snoring: Secondary | ICD-10-CM

## 2022-01-21 DIAGNOSIS — G4720 Circadian rhythm sleep disorder, unspecified type: Secondary | ICD-10-CM

## 2022-01-21 DIAGNOSIS — G478 Other sleep disorders: Secondary | ICD-10-CM

## 2022-01-23 ENCOUNTER — Encounter: Payer: Self-pay | Admitting: Physical Therapy

## 2022-01-23 ENCOUNTER — Ambulatory Visit: Payer: Medicare HMO | Admitting: Physical Therapy

## 2022-01-23 DIAGNOSIS — M6281 Muscle weakness (generalized): Secondary | ICD-10-CM

## 2022-01-23 DIAGNOSIS — R2689 Other abnormalities of gait and mobility: Secondary | ICD-10-CM | POA: Diagnosis not present

## 2022-01-23 DIAGNOSIS — R293 Abnormal posture: Secondary | ICD-10-CM

## 2022-01-23 DIAGNOSIS — R2681 Unsteadiness on feet: Secondary | ICD-10-CM

## 2022-01-23 DIAGNOSIS — M542 Cervicalgia: Secondary | ICD-10-CM | POA: Diagnosis not present
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
## 2022-01-24 ENCOUNTER — Encounter: Payer: Self-pay | Admitting: Orthopedic Surgery

## 2022-01-24 NOTE — Procedures (Signed)
Physician Interpretation:     [facility] POLYSOMNOGRAPHY  INTERPRETATION REPORT   STUDY DATE:  01/21/2022     PATIENT NAME:  [name]         DATE OF BIRTH:  [birth year]  PATIENT ID:  [id number]    TYPE OF STUDY:  PSG  READING PHYSICIAN: Star Age, MD REFERRED BY: Dr. Isaac Bliss SCORING TECHNICIAN: Richard Miu, RPSGT  HISTORY:  71 year old right-handed woman with an underlying complex medical history of coronary artery disease with history of MI, ischemic cardiomyopathy, left bundle branch block, paroxysmal A-fib, status post CABG, status post AICD placement, chronic systolic and diastolic congestive heart failure, hypertension, hyperlipidemia, diabetes, allergic rhinitis, breast cancer, small bowel obstruction, vitamin D deficiency, borderline overweight state, status post multiple surgeries including bilateral mastectomies, hernia repair, tubal ligation, CABG x4, AICD placement, who reports snoring and excessive daytime somnolence. Height: 64 in Weight: 148 lb (BMI 25) Neck Size: 15 in  MEDICATIONS: Apple Cider Vinegar, Coreg, Vitamin D, Lasix, Glucotrol, Imdur, Jardiance, Cozaar, Glucophage, Nitrostat, Repatha, Trulicity, Xarelto TECHNICAL DESCRIPTION: A registered sleep technologist was in attendance for the duration of the recording.  Data collection, scoring, video monitoring, and reporting were performed in compliance with the AASM Manual for the Scoring of Sleep and Associated Events; (Hypopnea is scored based on the criteria listed in Section VIII D. 1b in the AASM Manual V2.6 using a 4% oxygen desaturation rule or Hypopnea is scored based on the criteria listed in Section VIII D. 1a in the AASM Manual V2.6 using 3% oxygen desaturation and /or arousal rule).   SLEEP CONTINUITY AND SLEEP ARCHITECTURE:  Lights-out was at 20:35: and lights-on at  05:03:, with a total recording time of 8 hours, 28 min . Total sleep time ( TST) was 239.5  minutes with a decreased sleep efficiency at 47.1%.   BODY POSITION:  TST was divided  between the following sleep positions: 7.7% supine;  92.3% lateral;  0% prone. Duration of total sleep and percent of total sleep in their respective position is as follows: supine 18 minutes (8%), non-supine 221 minutes (92%); right 168 minutes (70%), left 52 minutes (22%), and prone 00 minutes (0%).  Total supine REM sleep time was 00 minutes (0% of total REM sleep). Sleep latency was markedly delayed at 213.0 minutes.  REM sleep latency was normal at 71.5 minutes. Of the total sleep time, the percentage of stage N1 sleep was 2.5%, stage N2 sleep was 74%, which is markedly increased, stage N3 sleep was 18.4%, which is normal and REM sleep was 5.2%, which is markedly reduced. Wake after sleep onset (WASO) time accounted for 55.5 minutes with moderate sleep fragmentation noted.  RESPIRATORY MONITORING:  Based on CMS criteria (using a 4% oxygen desaturation rule for scoring hypopneas), there were 6 apneas (6 obstructive; 0 central; 0 mixed), and 15 hypopneas.  Apnea index was 1.5. Hypopnea index was 3.8. The apnea-hypopnea index was 5.3/hour overall (64.9 supine, 5 non-supine; 4.8 REM, 0.0 supine REM).  There were 0 respiratory effort-related arousals (RERAs).  The RERA index was 0 events/h. Total respiratory disturbance index (RDI) was 5.3 events/h. RDI results showed: supine RDI  64.9 /h; non-supine RDI 0.3 /h; REM RDI 4.8 /h, supine REM RDI 0.0 /h.   Based on AASM criteria (using a 3% oxygen desaturation and /or arousal rule for scoring hypopneas), there were 6 apneas (6 obstructive; 0 central; 0 mixed), and 15 hypopneas. Apnea index was 1.5. Hypopnea index was 3.8. The apnea-hypopnea  index was 5.3 overall (64.9 supine, 5 non-supine; 4.8 REM, 0.0 supine REM).  There were 0 respiratory effort-related arousals (RERAs).  The RERA index was 0 events/h. Total respiratory disturbance index (RDI) was 5.3 events/h. RDI results  showed: supine RDI  64.9 /h; non-supine RDI 0.3 /h; REM RDI 4.8 /h, supine REM RDI 0.0 /h.  OXIMETRY: Oxyhemoglobin Saturation Nadir during sleep was at  82%) from a mean of 95%.  Of the Total sleep time (TST)   hypoxemia (=<88%) was present for  1.1 minutes, or 0.4% of total sleep time.  LIMB MOVEMENTS: There were 0 periodic limb movements of sleep (0.0/hr), of which 0 (0.0/hr) were associated with an arousal. AROUSAL: There were 33 arousals in total, for an arousal index of 8 arousals/hour.  Of these, 13 were identified as respiratory-related arousals (3 /h), 0 were PLM-related arousals (0 /h), and 28 were non-specific arousals (7 /h). EEG: Review of the EEG showed no abnormal electrical discharges and symmetrical bihemispheric findings.   EKG: The EKG revealed normal sinus rhythm (NSR). The average heart rate during sleep was 79 bpm.  AUDIO/VIDEO REVIEW: The audio and video review did not show any abnormal or unusual behaviors, movements, phonations or vocalizations. The patient took 1 restroom break.  Snoring was in the mild to moderate range.  POST-STUDY QUESTIONNAIRE: Post study, the patient indicated, that sleep was the same as usual.  IMPRESSION:  1. Mild obstructive sleep apnea (OSA) 2. Dysfunctions associated with sleep stages or arousal from sleep 3. Poor sleep pattern RECOMMENDATIONS:  1. This study demonstrates overall mild obstructive sleep apnea, with a total AHI of 5.3/hour, and O2 nadir of 82%.  Mild to moderate snoring was detected. The study was limited secondary to reduced sleep efficiency, poor sleep consolidation, and reduced REM sleep percentage. Given the patient's medical history and sleep related complaints, treatment with positive airway pressure is recommended; this can be achieved in the form of autoPAP. Alternatively, a full-night CPAP titration study would allow optimization of therapy if needed. Other treatment options may include avoidance of supine sleep position along  with weight loss, or the use of an oral appliance in selected patients. Please note, that untreated obstructive sleep apnea may carry additional perioperative morbidity. Patients with significant obstructive sleep apnea should receive perioperative PAP therapy and the surgeons and particularly the anesthesiologist should be informed of the diagnosis and the severity of the sleep disordered breathing. 2. This study shows significant sleep fragmentation and abnormal sleep stage percentages; these are nonspecific findings and per se do not signify an intrinsic sleep disorder or a cause for the patient's sleep-related symptoms. Causes include (but are not limited to) the first night effect of the sleep study, circadian rhythm disturbances, medication effect or an underlying mood disorder or medical problem.  3. The patient should be cautioned not to drive, work at heights, or operate dangerous or heavy equipment when tired or sleepy. Review and reiteration of good sleep hygiene measures should be pursued with any patient. 4. The patient will be seen in follow-up by Dr. Rexene Alberts at [facility] for discussion of the test results and further management strategies. The referring provider will be notified of the test results.   I certify that I have reviewed the entire raw data recording prior to the issuance of this report in accordance with the Standards of Accreditation of the American Academy of Sleep Medicine (AASM). Star Age, MD, PhD [facility]) Diplomat, ABPN (Neurology and Sleep)  Technical Report:   General Information  Name: Mistina, [place] BMI: 25.40 Physician: Star Age, MD  ID: [id number] Height: 64.0 in Technician: Richard Miu, RPSGT  Sex: Female Weight: 148.0 lb Record: [id number]  Age: 62 [02/09/1951] Date: 01/21/2022    Medical & Medication History    71 year old right-handed woman with an underlying complex medical history of coronary artery  disease with history of MI, ischemic cardiomyopathy, left bundle branch block, paroxysmal A-fib, status post CABG, status post AICD placement, chronic systolic and diastolic congestive heart failure, hypertension, hyperlipidemia, diabetes, allergic rhinitis, breast cancer, small bowel obstruction, vitamin D deficiency, borderline overweight state, status post multiple surgeries including bilateral mastectomies, hernia repair, tubal ligation, CABG x4, AICD placement, who reports snoring and excessive daytime somnolence. She does not sleep well at night with multiple nighttime awakenings.  Apple Cider Vinegar, Coreg, Vitamin D, Lasix, Glucotrol, Imdur, Jardiance, Cozaar, Glucophage, Nitrostat, Repatha, Trulicity, Xarelto   Sleep Disorder      Comments   The patient came into the sleep lab for a PSG. She had one restroom break. EKG irregular pt does have a pacemaker. Mild to moderate snoring. Slept lateral mostly. All sleep stages observed. There was a short period that pt woke up and got on her phone. Snoring and respiratory events worse while supine.     Lights out: 08:35:40 PM Lights on: 05:03:47 AM   Time Total Supine Side Prone Upright  Recording (TRT) 8h 28.521m2h 29.078mh 59.229m53m 0.229m 41m0.229m  82mep (TST) 3h 59.21m 0h56m.21m 3h 3229m229m 0h 063m 0h 0.72m  Late19m N1 N2 N3 REM Onset Per. Slp. Eff.  Actual 0h 0.229m 0h 0.21m14m 10.229m52m 11.21m 74m33.229m 353m3.229m 4759m%   [id number] Wake N1 N2 N3 REM  Total 268.5 6.0 177.0 44.0 12.5  Supine 130.5 0.0 18.5 0.0 0.0  Side 138.0 6.0 158.5 44.0 12.5  Prone 0.0 0.0 0.0 0.0 0.0  Upright 0.0 0.0 0.0 0.0 0.0   Stg % Wake N1 N2 N3 REM  Total 52.9 2.5 73.9 18.4 5.2  Supine 25.7 0.0 7.7 0.0 0.0  Side 27.2 2.5 66.2 18.4 5.2  Prone 0.0 0.0 0.0 0.0 0.0  Upright 0.0 0.0 0.0 0.0 0.0     Apnea Summary Sub Supine Side Prone Upright  Total 6 Total '6 5 1 '$ 0 0    REM 1 0 1 0 0    NREM 5 5 0 0 0  Obs 6 REM 1 0 1 0 0    NREM 5 5 0 0 0  Mix 0 REM 0 0 0 0 0    NREM 0 0  0 0 0  Cen 0 REM 0 0 0 0 0    NREM 0 0 0 0 0   Rera Summary Sub Supine Side Prone Upright  Total 0 Total 0 0 0 0 0    REM 0 0 0 0 0    NREM 0 0 0 0 0   Hypopnea Summary Sub Supine Side Prone Upright  Total 15 Total 15 15 0 0 0    REM 0 0 0 0 0    NREM 15 15 0 0 0   4% Hypopnea Summary Sub Supine Side Prone Upright  Total (4%) 15 Total 15 15 0 0 0    REM 0 0 0 0 0    NREM 15 15 0 0 0     AHI Total Obs Mix Cen  5.26 Apnea 1.50 1.50 0.00  0.00   Hypopnea 3.76 -- -- --  5.26 Hypopnea (4%) 3.76 -- -- --    Total Supine Side Prone Upright  Position AHI 5.26 64.86 0.27 0.00 0.00  REM AHI 4.80   NREM AHI 5.29   Position RDI 5.26 64.86 0.27 0.00 0.00  REM RDI 4.80   NREM RDI 5.29    4% Hypopnea Total Supine Side Prone Upright  Position AHI (4%) 5.26 64.86 0.27 0.00 0.00  REM AHI (4%) 4.80   NREM AHI (4%) 5.29   Position RDI (4%) 5.26 64.86 0.27 0.00 0.00  REM RDI (4%) 4.80   NREM RDI (4%) 5.29    Desaturation Information Threshold: 2% <100% <90% <80% <70% <60% <50% <40%  Supine 80.0 8.0 0.0 0.0 0.0 0.0 0.0  Side 89.0 0.0 0.0 0.0 0.0 0.0 0.0  Prone 0.0 0.0 0.0 0.0 0.0 0.0 0.0  Upright 0.0 0.0 0.0 0.0 0.0 0.0 0.0  Total 169.0 8.0 0.0 0.0 0.0 0.0 0.0  Index 34.4 1.6 0.0 0.0 0.0 0.0 0.0   Threshold: 3% <100% <90% <80% <70% <60% <50% <40%  Supine 41.0 8.0 0.0 0.0 0.0 0.0 0.0  Side 14.0 0.0 0.0 0.0 0.0 0.0 0.0  Prone 0.0 0.0 0.0 0.0 0.0 0.0 0.0  Upright 0.0 0.0 0.0 0.0 0.0 0.0 0.0  Total 55.0 8.0 0.0 0.0 0.0 0.0 0.0  Index 11.2 1.6 0.0 0.0 0.0 0.0 0.0   Threshold: 4% <100% <90% <80% <70% <60% <50% <40%  Supine 28.0 8.0 0.0 0.0 0.0 0.0 0.0  Side 5.0 0.0 0.0 0.0 0.0 0.0 0.0  Prone 0.0 0.0 0.0 0.0 0.0 0.0 0.0  Upright 0.0 0.0 0.0 0.0 0.0 0.0 0.0  Total 33.0 8.0 0.0 0.0 0.0 0.0 0.0  Index 6.7 1.6 0.0 0.0 0.0 0.0 0.0   Threshold: 3% <100% <90% <80% <70% <60% <50% <40%  Supine 41 8 0 0 0 0 0  Side 14 0 0 0 0 0 0  Prone 0 0 0 0 0 0 0  Upright 0 0 0 0 0 0 0  Total 55 8 0  0 0 0 0   Awakening/Arousal Information # of Awakenings 23  Wake after sleep onset 55.102m Wake after persistent sleep 55.539m Arousal Assoc. Arousals Index  Apneas 3 0.8  Hypopneas 10 2.5  Leg Movements 0 0.0  Snore 0 0.0  PTT Arousals 0 0.0  Spontaneous 28 7.0  Total 41 10.3  Leg Movement Information PLMS LMs Index  Total LMs during PLMS 0 0.0  LMs w/ Microarousals 0 0.0   LM LMs Index  w/ Microarousal 0 0.0  w/ Awakening 0 0.0  w/ Resp Event 0 0.0  Spontaneous 1 0.3  Total 1 0.3     Desaturation threshold setting: 3% Minimum desaturation setting: 10 seconds SaO2 nadir: 82% The longest event was a 70 sec obstructive Hypopnea with a minimum SaO2 of 90%. The lowest SaO2 was 82% associated with a 27 sec obstructive Apnea. EKG Rates EKG Avg Max Min  Awake 87 105 60  Asleep 79 89 53  EKG Events: Tachycardia

## 2022-01-24 NOTE — Progress Notes (Signed)
Remote pacemaker transmission.   

## 2022-01-25 ENCOUNTER — Ambulatory Visit: Payer: Medicare HMO | Admitting: Physical Therapy

## 2022-01-25 ENCOUNTER — Encounter: Payer: Self-pay | Admitting: Physical Therapy

## 2022-01-25 DIAGNOSIS — R2689 Other abnormalities of gait and mobility: Secondary | ICD-10-CM | POA: Diagnosis not present

## 2022-01-25 DIAGNOSIS — M6281 Muscle weakness (generalized): Secondary | ICD-10-CM

## 2022-01-25 DIAGNOSIS — R293 Abnormal posture: Secondary | ICD-10-CM | POA: Diagnosis not present

## 2022-01-25 DIAGNOSIS — R2681 Unsteadiness on feet: Secondary | ICD-10-CM | POA: Diagnosis not present

## 2022-01-25 DIAGNOSIS — M542 Cervicalgia: Secondary | ICD-10-CM

## 2022-01-25 NOTE — Therapy (Signed)
OUTPATIENT PHYSICAL THERAPY NEURO TREATMENT NOTES   Patient Name: [name] MRN: [id number] DOB:Sep 24, 1950, 71 y.o., female Today's Date: 01/25/2022   PCP: Isaac Bliss, Rayford Halsted, MD  REFERRING PROVIDER: Isaac Bliss, Rayford Halsted, MD       PT End of Session - 01/25/22 0805     Visit Number 13    Number of Visits 23    Date for PT Re-Evaluation 02/27/22    Authorization Type [employer]    Authorization Time Period approved 12 PT visits from 01/23/22-02/27/22    Authorization - Visit Number 2    Authorization - Number of Visits 12    PT Start Time 0805    PT Stop Time 0845    PT Time Calculation (min) 40 min    Equipment Utilized During Treatment Gait belt    Activity Tolerance Patient tolerated treatment well;Patient limited by pain    Behavior During Therapy [facility] for tasks assessed/performed                       Past Medical History:  Diagnosis Date   Acute on chronic combined systolic and diastolic CHF, NYHA class 3 ([place]) 03/19/2013   AICD (automatic cardioverter/defibrillator) present 01/2013   Biventricular cardiac pacemaker in situ    Allergic rhinitis    Anogenital warts 01/22/2011   Anxiety state, unspecified 05/13/2013   BREAST CANCER, HX OF 09/19/2009   Qualifier: Diagnosis of  By: [name], [name]    CAD (coronary artery disease)    a. 2004: s/p MI in [place]. No PCI->Medical RX;  b. 07/2012 Cath: LM 30-40, LAD 70p, 70/55m D1 80-90p, OM1 small 90p, OM2 large 80-90p, 560m70-80d, RCA 20-30 diff, EF 40%, glob HK.s/p CABG   Cancer of left breast ([facility]   Patient reports left breast cancer diagnosis in 2002 treated with bilateral mastectomy positive lymph nodes with left axillary dissection followed by chemotherapy of unknown type   Cataract    Chronic combined systolic and diastolic CHF, NYHA class 2 (HC[place]10/31/2014   Diabetes mellitus type II    Exertional dyspnea 08/05/2014   Exertional shortness of breath    Generalized  osteoarthrosis, involving multiple sites 08/25/2015   History of colon polyps    Hyperlipidemia    Hypertension    Incisional hernia, without obstruction or gangrene 05/04/2015   Ischemic cardiomyopathy    a. 07/2012 Echo: EF 35%, Sev inferoseptal HK, mildly dil LA, Peak PASP 5932m.   LBBB (left bundle branch block)    a. intermittent - present during rapid afib 07/2012.   MYOCARDIAL INFARCTION, HX OF 09/19/2009   Qualifier: Diagnosis of  By: TodSherren Mocha, JefJory Ee Neuromuscular disorder ([facility]  Patient reports chronic numbness in the right foot related to previous surgery on the right leg and "nerve damage"   PAF (paroxysmal atrial fibrillation) ([facility]CC[place]  a. 07/2012: Amio and xarelto initiated.   Right carotid bruit 08/11/2012   SBO (small bowel obstruction) (HCC)    SOB (shortness of breath) 02/26/2018   Type 2 diabetes mellitus with circulatory disorder, without long-term current use of insulin ([place]CC[place]7/25/2017   Vitamin D deficiency 02/22/2016   Past Surgical History:  Procedure Laterality Date   ABDOMINAL HYSTERECTOMY  2000   APPENDECTOMY  1974   BI-VENTRICULAR PACEMAKER INSERTION N/A 01/25/2013   Procedure: BI-VENTRICULAR PACEMAKER INSERTION (CRT-P);  Surgeon: GreEvans LanceD;  [facility];  Service: Cardiovascular;  Laterality: N/A;  OUTPATIENT PHYSICAL THERAPY NEURO TREATMENT NOTES   Patient Name: [name] MRN: [id number] DOB:Sep 24, 1950, 71 y.o., female Today's Date: 01/25/2022   PCP: Isaac Bliss, Rayford Halsted, MD  REFERRING PROVIDER: Isaac Bliss, Rayford Halsted, MD       PT End of Session - 01/25/22 0805     Visit Number 13    Number of Visits 23    Date for PT Re-Evaluation 02/27/22    Authorization Type [employer]    Authorization Time Period approved 12 PT visits from 01/23/22-02/27/22    Authorization - Visit Number 2    Authorization - Number of Visits 12    PT Start Time 0805    PT Stop Time 0845    PT Time Calculation (min) 40 min    Equipment Utilized During Treatment Gait belt    Activity Tolerance Patient tolerated treatment well;Patient limited by pain    Behavior During Therapy [facility] for tasks assessed/performed                       Past Medical History:  Diagnosis Date   Acute on chronic combined systolic and diastolic CHF, NYHA class 3 ([place]) 03/19/2013   AICD (automatic cardioverter/defibrillator) present 01/2013   Biventricular cardiac pacemaker in situ    Allergic rhinitis    Anogenital warts 01/22/2011   Anxiety state, unspecified 05/13/2013   BREAST CANCER, HX OF 09/19/2009   Qualifier: Diagnosis of  By: [name], [name]    CAD (coronary artery disease)    a. 2004: s/p MI in [place]. No PCI->Medical RX;  b. 07/2012 Cath: LM 30-40, LAD 70p, 70/55m D1 80-90p, OM1 small 90p, OM2 large 80-90p, 560m70-80d, RCA 20-30 diff, EF 40%, glob HK.s/p CABG   Cancer of left breast ([facility] [place]   Patient reports left breast cancer diagnosis in 2002 treated with bilateral mastectomy positive lymph nodes with left axillary dissection followed by chemotherapy of unknown type   Cataract    Chronic combined systolic and diastolic CHF, NYHA class 2 (HCInter[place]10/31/2014   Diabetes mellitus type II    Exertional dyspnea 08/05/2014   Exertional shortness of breath    Generalized  osteoarthrosis, involving multiple sites 08/25/2015   History of colon polyps    Hyperlipidemia    Hypertension    Incisional hernia, without obstruction or gangrene 05/04/2015   Ischemic cardiomyopathy    a. 07/2012 Echo: EF 35%, Sev inferoseptal HK, mildly dil LA, Peak PASP 5932m.   LBBB (left bundle branch block)    a. intermittent - present during rapid afib 07/2012.   MYOCARDIAL INFARCTION, HX OF 09/19/2009   Qualifier: Diagnosis of  By: TodSherren Mocha, JefJory Ee Neuromuscular disorder ([facility]  Patient reports chronic numbness in the right foot related to previous surgery on the right leg and "nerve damage"   PAF (paroxysmal atrial fibrillation) (HCCNile  a. 07/2012: Amio and xarelto initiated.   Right carotid bruit 08/11/2012   SBO (small bowel obstruction) (HCC)    SOB (shortness of breath) 02/26/2018   Type 2 diabetes mellitus with circulatory disorder, without long-term current use of insulin (HCCFor[place]7/25/2017   Vitamin D deficiency 02/22/2016   Past Surgical History:  Procedure Laterality Date   ABDOMINAL HYSTERECTOMY  2000   APPENDECTOMY  1974   BI-VENTRICULAR PACEMAKER INSERTION N/A 01/25/2013   Procedure: BI-VENTRICULAR PACEMAKER INSERTION (CRT-P);  Surgeon: GreEvans LanceD;  [facility];  Service: Cardiovascular;  Laterality: N/A;  OUTPATIENT PHYSICAL THERAPY NEURO TREATMENT NOTES   Patient Name: [name] MRN: [id number] DOB:1950-11-14, 71 y.o., female Today's Date: 01/25/2022   PCP: Isaac Bliss, Rayford Halsted, MD  REFERRING PROVIDER: Isaac Bliss, Rayford Halsted, MD       PT End of Session - 01/25/22 0805     Visit Number 13    Number of Visits 23    Date for PT Re-Evaluation 02/27/22    Authorization Type [employer]    Authorization Time Period approved 12 PT visits from 01/23/22-02/27/22    Authorization - Visit Number 2    Authorization - Number of Visits 12    PT Start Time 0805    PT Stop Time 0845    PT Time Calculation (min) 40 min    Equipment Utilized During Treatment Gait belt    Activity Tolerance Patient tolerated treatment well;Patient limited by pain    Behavior During Therapy [facility] for tasks assessed/performed                       Past Medical History:  Diagnosis Date   Acute on chronic combined systolic and diastolic CHF, NYHA class 3 ([place]) 03/19/2013   AICD (automatic cardioverter/defibrillator) present 01/2013   Biventricular cardiac pacemaker in situ    Allergic rhinitis    Anogenital warts 01/22/2011   Anxiety state, unspecified 05/13/2013   BREAST CANCER, HX OF 09/19/2009   Qualifier: Diagnosis of  By: [name], [name]    CAD (coronary artery disease)    a. 2004: s/p MI in [place]. No PCI->Medical RX;  b. 07/2012 Cath: LM 30-40, LAD 70p, 70/63m D1 80-90p, OM1 small 90p, OM2 large 80-90p, 533m70-80d, RCA 20-30 diff, EF 40%, glob HK.s/p CABG   Cancer of left breast ([facility]2002   Patient reports left breast cancer diagnosis in 2002 treated with bilateral mastectomy positive lymph nodes with left axillary dissection followed by chemotherapy of unknown type   Cataract    Chronic combined systolic and diastolic CHF, NYHA class 2 (HC[place]10/31/2014   Diabetes mellitus type II    Exertional dyspnea 08/05/2014   Exertional shortness of breath    Generalized  osteoarthrosis, involving multiple sites 08/25/2015   History of colon polyps    Hyperlipidemia    Hypertension    Incisional hernia, without obstruction or gangrene 05/04/2015   Ischemic cardiomyopathy    a. 07/2012 Echo: EF 35%, Sev inferoseptal HK, mildly dil LA, Peak PASP 5971m.   LBBB (left bundle branch block)    a. intermittent - present during rapid afib 07/2012.   MYOCARDIAL INFARCTION, HX OF 09/19/2009   Qualifier: Diagnosis of  By: TodSherren Mocha, JefJory Ee Neuromuscular disorder ([facility]  Patient reports chronic numbness in the right foot related to previous surgery on the right leg and "nerve damage"   PAF (paroxysmal atrial fibrillation) ([place]CC[place]  a. 07/2012: Amio and xarelto initiated.   Right carotid bruit 08/11/2012   SBO (small bowel obstruction) (HCC)    SOB (shortness of breath) 02/26/2018   Type 2 diabetes mellitus with circulatory disorder, without long-term current use of insulin (HCCSkellytown7/25/2017   Vitamin D deficiency 02/22/2016   Past Surgical History:  Procedure Laterality Date   ABDOMINAL HYSTERECTOMY  2000   APPENDECTOMY  1974   BI-VENTRICULAR PACEMAKER INSERTION N/A 01/25/2013   Procedure: BI-VENTRICULAR PACEMAKER INSERTION (CRT-P);  Surgeon: GreEvans LanceD;  [facility];  Service: Cardiovascular;  Laterality: N/A;  OUTPATIENT PHYSICAL THERAPY NEURO TREATMENT NOTES   Patient Name: [name] MRN: [id number] DOB:Sep 24, 1950, 71 y.o., female Today's Date: 01/25/2022   PCP: Isaac Bliss, Rayford Halsted, MD  REFERRING PROVIDER: Isaac Bliss, Rayford Halsted, MD       PT End of Session - 01/25/22 0805     Visit Number 13    Number of Visits 23    Date for PT Re-Evaluation 02/27/22    Authorization Type [employer]    Authorization Time Period approved 12 PT visits from 01/23/22-02/27/22    Authorization - Visit Number 2    Authorization - Number of Visits 12    PT Start Time 0805    PT Stop Time 0845    PT Time Calculation (min) 40 min    Equipment Utilized During Treatment Gait belt    Activity Tolerance Patient tolerated treatment well;Patient limited by pain    Behavior During Therapy [facility] for tasks assessed/performed                       Past Medical History:  Diagnosis Date   Acute on chronic combined systolic and diastolic CHF, NYHA class 3 ([place]) 03/19/2013   AICD (automatic cardioverter/defibrillator) present 01/2013   Biventricular cardiac pacemaker in situ    Allergic rhinitis    Anogenital warts 01/22/2011   Anxiety state, unspecified 05/13/2013   BREAST CANCER, HX OF 09/19/2009   Qualifier: Diagnosis of  By: [name], [name]    CAD (coronary artery disease)    a. 2004: s/p MI in [place]. No PCI->Medical RX;  b. 07/2012 Cath: LM 30-40, LAD 70p, 70/55m D1 80-90p, OM1 small 90p, OM2 large 80-90p, 560m70-80d, RCA 20-30 diff, EF 40%, glob HK.s/p CABG   Cancer of left breast ([facility]   Patient reports left breast cancer diagnosis in 2002 treated with bilateral mastectomy positive lymph nodes with left axillary dissection followed by chemotherapy of unknown type   Cataract    Chronic combined systolic and diastolic CHF, NYHA class 2 (HC[place]10/31/2014   Diabetes mellitus type II    Exertional dyspnea 08/05/2014   Exertional shortness of breath    Generalized  osteoarthrosis, involving multiple sites 08/25/2015   History of colon polyps    Hyperlipidemia    Hypertension    Incisional hernia, without obstruction or gangrene 05/04/2015   Ischemic cardiomyopathy    a. 07/2012 Echo: EF 35%, Sev inferoseptal HK, mildly dil LA, Peak PASP 5932m.   LBBB (left bundle branch block)    a. intermittent - present during rapid afib 07/2012.   MYOCARDIAL INFARCTION, HX OF 09/19/2009   Qualifier: Diagnosis of  By: TodSherren Mocha, JefJory Ee Neuromuscular disorder ([facility]  Patient reports chronic numbness in the right foot related to previous surgery on the right leg and "nerve damage"   PAF (paroxysmal atrial fibrillation) (HCCNile  a. 07/2012: Amio and xarelto initiated.   Right carotid bruit 08/11/2012   SBO (small bowel obstruction) (HCC)    SOB (shortness of breath) 02/26/2018   Type 2 diabetes mellitus with circulatory disorder, without long-term current use of insulin ([place]CCFor[place]7/25/2017   Vitamin D deficiency 02/22/2016   Past Surgical History:  Procedure Laterality Date   ABDOMINAL HYSTERECTOMY  2000   APPENDECTOMY  1974   BI-VENTRICULAR PACEMAKER INSERTION N/A 01/25/2013   Procedure: BI-VENTRICULAR PACEMAKER INSERTION (CRT-P);  Surgeon: GreEvans LanceD;  [facility];  Service: Cardiovascular;  Laterality: N/A;  OUTPATIENT PHYSICAL THERAPY NEURO TREATMENT NOTES   Patient Name: [name] MRN: [id number] DOB:1950-11-14, 71 y.o., female Today's Date: 01/25/2022   PCP: Isaac Bliss, Rayford Halsted, MD  REFERRING PROVIDER: Isaac Bliss, Rayford Halsted, MD       PT End of Session - 01/25/22 0805     Visit Number 13    Number of Visits 23    Date for PT Re-Evaluation 02/27/22    Authorization Type [employer]    Authorization Time Period approved 12 PT visits from 01/23/22-02/27/22    Authorization - Visit Number 2    Authorization - Number of Visits 12    PT Start Time 0805    PT Stop Time 0845    PT Time Calculation (min) 40 min    Equipment Utilized During Treatment Gait belt    Activity Tolerance Patient tolerated treatment well;Patient limited by pain    Behavior During Therapy [facility] for tasks assessed/performed                       Past Medical History:  Diagnosis Date   Acute on chronic combined systolic and diastolic CHF, NYHA class 3 ([place]) 03/19/2013   AICD (automatic cardioverter/defibrillator) present 01/2013   Biventricular cardiac pacemaker in situ    Allergic rhinitis    Anogenital warts 01/22/2011   Anxiety state, unspecified 05/13/2013   BREAST CANCER, HX OF 09/19/2009   Qualifier: Diagnosis of  By: [name], [name]    CAD (coronary artery disease)    a. 2004: s/p MI in [place]. No PCI->Medical RX;  b. 07/2012 Cath: LM 30-40, LAD 70p, 70/63m D1 80-90p, OM1 small 90p, OM2 large 80-90p, 533m70-80d, RCA 20-30 diff, EF 40%, glob HK.s/p CABG   Cancer of left breast ([facility]2002   Patient reports left breast cancer diagnosis in 2002 treated with bilateral mastectomy positive lymph nodes with left axillary dissection followed by chemotherapy of unknown type   Cataract    Chronic combined systolic and diastolic CHF, NYHA class 2 (HC[place]10/31/2014   Diabetes mellitus type II    Exertional dyspnea 08/05/2014   Exertional shortness of breath    Generalized  osteoarthrosis, involving multiple sites 08/25/2015   History of colon polyps    Hyperlipidemia    Hypertension    Incisional hernia, without obstruction or gangrene 05/04/2015   Ischemic cardiomyopathy    a. 07/2012 Echo: EF 35%, Sev inferoseptal HK, mildly dil LA, Peak PASP 5971m.   LBBB (left bundle branch block)    a. intermittent - present during rapid afib 07/2012.   MYOCARDIAL INFARCTION, HX OF 09/19/2009   Qualifier: Diagnosis of  By: TodSherren Mocha, JefJory Ee Neuromuscular disorder ([facility]CK[facility]  Patient reports chronic numbness in the right foot related to previous surgery on the right leg and "nerve damage"   PAF (paroxysmal atrial fibrillation) ([place]  a. 07/2012: Amio and xarelto initiated.   Right carotid bruit 08/11/2012   SBO (small bowel obstruction) (HCC)    SOB (shortness of breath) 02/26/2018   Type 2 diabetes mellitus with circulatory disorder, without long-term current use of insulin ([place]CC[place]7/25/2017   Vitamin D deficiency 02/22/2016   Past Surgical History:  Procedure Laterality Date   ABDOMINAL HYSTERECTOMY  2000   APPENDECTOMY  1974   BI-VENTRICULAR PACEMAKER INSERTION N/A 01/25/2013   Procedure: BI-VENTRICULAR PACEMAKER INSERTION (CRT-P);  Surgeon: GreEvans LanceD;  [facility];  Service: Cardiovascular;  Laterality: N/A;  OUTPATIENT PHYSICAL THERAPY NEURO TREATMENT NOTES   Patient Name: [name] MRN: [id number] DOB:Sep 24, 1950, 71 y.o., female Today's Date: 01/25/2022   PCP: Isaac Bliss, Rayford Halsted, MD  REFERRING PROVIDER: Isaac Bliss, Rayford Halsted, MD       PT End of Session - 01/25/22 0805     Visit Number 13    Number of Visits 23    Date for PT Re-Evaluation 02/27/22    Authorization Type [employer]    Authorization Time Period approved 12 PT visits from 01/23/22-02/27/22    Authorization - Visit Number 2    Authorization - Number of Visits 12    PT Start Time 0805    PT Stop Time 0845    PT Time Calculation (min) 40 min    Equipment Utilized During Treatment Gait belt    Activity Tolerance Patient tolerated treatment well;Patient limited by pain    Behavior During Therapy [facility] for tasks assessed/performed                       Past Medical History:  Diagnosis Date   Acute on chronic combined systolic and diastolic CHF, NYHA class 3 ([place]) 03/19/2013   AICD (automatic cardioverter/defibrillator) present 01/2013   Biventricular cardiac pacemaker in situ    Allergic rhinitis    Anogenital warts 01/22/2011   Anxiety state, unspecified 05/13/2013   BREAST CANCER, HX OF 09/19/2009   Qualifier: Diagnosis of  By: [name], [name]    CAD (coronary artery disease)    a. 2004: s/p MI in [place]. No PCI->Medical RX;  b. 07/2012 Cath: LM 30-40, LAD 70p, 70/55m D1 80-90p, OM1 small 90p, OM2 large 80-90p, 560m70-80d, RCA 20-30 diff, EF 40%, glob HK.s/p CABG   Cancer of left breast ([facility]   Patient reports left breast cancer diagnosis in 2002 treated with bilateral mastectomy positive lymph nodes with left axillary dissection followed by chemotherapy of unknown type   Cataract    Chronic combined systolic and diastolic CHF, NYHA class 2 (HC[place]10/31/2014   Diabetes mellitus type II    Exertional dyspnea 08/05/2014   Exertional shortness of breath    Generalized  osteoarthrosis, involving multiple sites 08/25/2015   History of colon polyps    Hyperlipidemia    Hypertension    Incisional hernia, without obstruction or gangrene 05/04/2015   Ischemic cardiomyopathy    a. 07/2012 Echo: EF 35%, Sev inferoseptal HK, mildly dil LA, Peak PASP 5932m.   LBBB (left bundle branch block)    a. intermittent - present during rapid afib 07/2012.   MYOCARDIAL INFARCTION, HX OF 09/19/2009   Qualifier: Diagnosis of  By: TodSherren Mocha, JefJory Ee Neuromuscular disorder ([facility]  Patient reports chronic numbness in the right foot related to previous surgery on the right leg and "nerve damage"   PAF (paroxysmal atrial fibrillation) (HCCNile  a. 07/2012: Amio and xarelto initiated.   Right carotid bruit 08/11/2012   SBO (small bowel obstruction) (HCC)    SOB (shortness of breath) 02/26/2018   Type 2 diabetes mellitus with circulatory disorder, without long-term current use of insulin ([place]CCFor[place]7/25/2017   Vitamin D deficiency 02/22/2016   Past Surgical History:  Procedure Laterality Date   ABDOMINAL HYSTERECTOMY  2000   APPENDECTOMY  1974   BI-VENTRICULAR PACEMAKER INSERTION N/A 01/25/2013   Procedure: BI-VENTRICULAR PACEMAKER INSERTION (CRT-P);  Surgeon: GreEvans LanceD;  [facility];  Service: Cardiovascular;  Laterality: N/A;  OUTPATIENT PHYSICAL THERAPY NEURO TREATMENT NOTES   Patient Name: [name] MRN: [id number] DOB:1950-11-14, 71 y.o., female Today's Date: 01/25/2022   PCP: Isaac Bliss, Rayford Halsted, MD  REFERRING PROVIDER: Isaac Bliss, Rayford Halsted, MD       PT End of Session - 01/25/22 0805     Visit Number 13    Number of Visits 23    Date for PT Re-Evaluation 02/27/22    Authorization Type [employer]    Authorization Time Period approved 12 PT visits from 01/23/22-02/27/22    Authorization - Visit Number 2    Authorization - Number of Visits 12    PT Start Time 0805    PT Stop Time 0845    PT Time Calculation (min) 40 min    Equipment Utilized During Treatment Gait belt    Activity Tolerance Patient tolerated treatment well;Patient limited by pain    Behavior During Therapy [facility] for tasks assessed/performed                       Past Medical History:  Diagnosis Date   Acute on chronic combined systolic and diastolic CHF, NYHA class 3 ([place]) 03/19/2013   AICD (automatic cardioverter/defibrillator) present 01/2013   Biventricular cardiac pacemaker in situ    Allergic rhinitis    Anogenital warts 01/22/2011   Anxiety state, unspecified 05/13/2013   BREAST CANCER, HX OF 09/19/2009   Qualifier: Diagnosis of  By: [name], [name]    CAD (coronary artery disease)    a. 2004: s/p MI in [place]. No PCI->Medical RX;  b. 07/2012 Cath: LM 30-40, LAD 70p, 70/63m D1 80-90p, OM1 small 90p, OM2 large 80-90p, 533m70-80d, RCA 20-30 diff, EF 40%, glob HK.s/p CABG   Cancer of left breast ([facility]2002   Patient reports left breast cancer diagnosis in 2002 treated with bilateral mastectomy positive lymph nodes with left axillary dissection followed by chemotherapy of unknown type   Cataract    Chronic combined systolic and diastolic CHF, NYHA class 2 (HC[place]10/31/2014   Diabetes mellitus type II    Exertional dyspnea 08/05/2014   Exertional shortness of breath    Generalized  osteoarthrosis, involving multiple sites 08/25/2015   History of colon polyps    Hyperlipidemia    Hypertension    Incisional hernia, without obstruction or gangrene 05/04/2015   Ischemic cardiomyopathy    a. 07/2012 Echo: EF 35%, Sev inferoseptal HK, mildly dil LA, Peak PASP 5971m.   LBBB (left bundle branch block)    a. intermittent - present during rapid afib 07/2012.   MYOCARDIAL INFARCTION, HX OF 09/19/2009   Qualifier: Diagnosis of  By: TodSherren Mocha, JefJory Ee Neuromuscular disorder ([facility]  Patient reports chronic numbness in the right foot related to previous surgery on the right leg and "nerve damage"   PAF (paroxysmal atrial fibrillation) ([place]  a. 07/2012: Amio and xarelto initiated.   Right carotid bruit 08/11/2012   SBO (small bowel obstruction) (HCC)    SOB (shortness of breath) 02/26/2018   Type 2 diabetes mellitus with circulatory disorder, without long-term current use of insulin (HCC[place]7/25/2017   Vitamin D deficiency 02/22/2016   Past Surgical History:  Procedure Laterality Date   ABDOMINAL HYSTERECTOMY  2000   APPENDECTOMY  1974   BI-VENTRICULAR PACEMAKER INSERTION N/A 01/25/2013   Procedure: BI-VENTRICULAR PACEMAKER INSERTION (CRT-P);  Surgeon: GreEvans LanceD;  [facility]  Service: Cardiovascular;  Laterality: N/A;

## 2022-01-28 NOTE — Addendum Note (Signed)
Addended by: Ileene Rubens on: 01/28/2022 09:26 AM   Modules accepted: Orders

## 2022-01-28 NOTE — Telephone Encounter (Signed)
Order changed and Hardy aware of order.

## 2022-01-28 NOTE — Addendum Note (Signed)
Addended by: Star Age on: 01/28/2022 06:36 PM   Modules accepted: Orders

## 2022-01-30 ENCOUNTER — Other Ambulatory Visit: Payer: Self-pay | Admitting: Cardiovascular Disease

## 2022-01-30 ENCOUNTER — Encounter: Payer: Self-pay | Admitting: *Deleted

## 2022-01-30 ENCOUNTER — Other Ambulatory Visit: Payer: Self-pay | Admitting: Internal Medicine

## 2022-01-30 ENCOUNTER — Telehealth: Payer: Self-pay | Admitting: *Deleted

## 2022-01-30 DIAGNOSIS — E1159 Type 2 diabetes mellitus with other circulatory complications: Secondary | ICD-10-CM

## 2022-01-30 DIAGNOSIS — I252 Old myocardial infarction: Secondary | ICD-10-CM

## 2022-01-30 DIAGNOSIS — E782 Mixed hyperlipidemia: Secondary | ICD-10-CM

## 2022-01-30 NOTE — Telephone Encounter (Signed)
Call patient and schedule initial autopap appt.

## 2022-01-30 NOTE — Telephone Encounter (Addendum)
Spoke with patient and discussed sleep study results. Patient aware study showed overall mild OSA.  Patient is aware Dr. Rexene Alberts recommends treatment with AutoPap given patient's medical history.  We discussed difference between AutoPap and CPAP.  Patient is amenable to proceed.  I have sent an order to [employer] as they take [facility].  Patient is aware she will receive a separate call to schedule an appointment that is required by insurance.  Patient must be seen between 30 and 90 days after set up on her machine.  She is also aware she will receive a separate call from [employer] within 1 week to discuss the neck steps and schedule a set up appointment.  I sent their number to her through [employer] in case she needs to follow-up with them.  Patient also aware of insurance compliance requirement of using the machine at least 4 hours at night.  Her questions were answered and she was very Patent attorney.  Results sent to PCP. Order sent to [employer].   Update 10/01/21: Brad w/ [employer] confirmed receipt.

## 2022-01-30 NOTE — Telephone Encounter (Signed)
-----   Message from Star Age, MD sent at 01/28/2022  6:36 PM EST ----- Patient referred by Dr. PCP, seen by me on 11/27/2021, diagnostic PSG on 11/21/2021.    Please call and notify the patient that the recent sleep study showed overall mild obstructive sleep apnea.  However, she did not sleep very well.  Given her medical history, I recommend treatment in the form of autoPAP, which means, that we don't have to bring her back for a second sleep study with CPAP, but will let him try an autoPAP machine at home, through a DME company (of her choice, or as per insurance requirement). The DME representative will educate her on how to use the machine, how to put the mask on, etc. I have placed an order in the chart. Please send referral, talk to patient, send report to referring MD. We will need a FU in sleep clinic for 10 weeks post-PAP set up, please arrange that with me or one of our NPs. Thanks,   Star Age, MD, PhD [facility])

## 2022-02-05 ENCOUNTER — Ambulatory Visit: Payer: Medicare HMO | Admitting: Physical Therapy

## 2022-02-05 ENCOUNTER — Encounter: Payer: Self-pay | Admitting: Physical Therapy

## 2022-02-05 DIAGNOSIS — R2689 Other abnormalities of gait and mobility: Secondary | ICD-10-CM

## 2022-02-05 DIAGNOSIS — R2681 Unsteadiness on feet: Secondary | ICD-10-CM

## 2022-02-05 DIAGNOSIS — R293 Abnormal posture: Secondary | ICD-10-CM | POA: Diagnosis not present

## 2022-02-05 DIAGNOSIS — M6281 Muscle weakness (generalized): Secondary | ICD-10-CM

## 2022-02-05 DIAGNOSIS — M542 Cervicalgia: Secondary | ICD-10-CM | POA: Diagnosis not present
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
## 2022-02-07 ENCOUNTER — Ambulatory Visit: Payer: Medicare HMO | Admitting: Physical Therapy

## 2022-02-07 ENCOUNTER — Encounter: Payer: Self-pay | Admitting: Physical Therapy

## 2022-02-07 DIAGNOSIS — R2681 Unsteadiness on feet: Secondary | ICD-10-CM | POA: Diagnosis not present

## 2022-02-07 DIAGNOSIS — M6281 Muscle weakness (generalized): Secondary | ICD-10-CM

## 2022-02-07 DIAGNOSIS — M542 Cervicalgia: Secondary | ICD-10-CM | POA: Diagnosis not present

## 2022-02-07 DIAGNOSIS — R293 Abnormal posture: Secondary | ICD-10-CM | POA: Diagnosis not present

## 2022-02-07 DIAGNOSIS — R2689 Other abnormalities of gait and mobility: Secondary | ICD-10-CM | POA: Diagnosis not present
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
## 2022-02-12 ENCOUNTER — Other Ambulatory Visit: Payer: Medicare HMO

## 2022-02-12 ENCOUNTER — Ambulatory Visit: Payer: Medicare HMO | Attending: Internal Medicine | Admitting: Physical Therapy

## 2022-02-12 ENCOUNTER — Encounter: Payer: Self-pay | Admitting: Physical Therapy

## 2022-02-12 DIAGNOSIS — R2689 Other abnormalities of gait and mobility: Secondary | ICD-10-CM | POA: Insufficient documentation

## 2022-02-12 DIAGNOSIS — M6281 Muscle weakness (generalized): Secondary | ICD-10-CM | POA: Insufficient documentation

## 2022-02-12 DIAGNOSIS — R2681 Unsteadiness on feet: Secondary | ICD-10-CM | POA: Insufficient documentation

## 2022-02-12 DIAGNOSIS — M542 Cervicalgia: Secondary | ICD-10-CM | POA: Diagnosis not present

## 2022-02-12 DIAGNOSIS — R293 Abnormal posture: Secondary | ICD-10-CM | POA: Insufficient documentation
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
## 2022-02-14 ENCOUNTER — Ambulatory Visit: Payer: Medicare HMO | Admitting: Physical Therapy

## 2022-02-17 ENCOUNTER — Encounter: Payer: Self-pay | Admitting: Cardiovascular Disease
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
## 2022-02-19 ENCOUNTER — Encounter: Payer: Self-pay | Admitting: Physical Therapy

## 2022-02-19 ENCOUNTER — Ambulatory Visit: Payer: Medicare HMO | Admitting: Physical Therapy

## 2022-02-19 ENCOUNTER — Encounter: Payer: Self-pay | Admitting: Orthopedic Surgery

## 2022-02-19 DIAGNOSIS — R2681 Unsteadiness on feet: Secondary | ICD-10-CM

## 2022-02-19 DIAGNOSIS — R293 Abnormal posture: Secondary | ICD-10-CM | POA: Diagnosis not present

## 2022-02-19 DIAGNOSIS — R2689 Other abnormalities of gait and mobility: Secondary | ICD-10-CM | POA: Diagnosis not present

## 2022-02-19 DIAGNOSIS — M6281 Muscle weakness (generalized): Secondary | ICD-10-CM

## 2022-02-19 DIAGNOSIS — M542 Cervicalgia: Secondary | ICD-10-CM | POA: Diagnosis not present

## 2022-02-21 ENCOUNTER — Ambulatory Visit: Payer: Medicare HMO | Admitting: Physical Therapy

## 2022-02-25 ENCOUNTER — Ambulatory Visit: Payer: Medicare HMO | Admitting: Physical Therapy

## 2022-02-27 ENCOUNTER — Ambulatory Visit: Payer: Medicare HMO | Admitting: Physical Therapy

## 2022-03-14 ENCOUNTER — Telehealth: Payer: Self-pay | Admitting: *Deleted

## 2022-03-14 ENCOUNTER — Encounter: Payer: Self-pay | Admitting: *Deleted

## 2022-03-14 NOTE — Patient Outreach (Signed)
  Care Coordination   Initial Visit Note   03/14/2022 Name: Brindy Higginbotham MRN: [id number] DOB: [birth year]  Nylee Barbuto is a 72 y.o. year old female who sees Isaac Bliss, Rayford Halsted, MD for primary care. I spoke with  Marciano Sequin by phone today.  What matters to the patients health and wellness today?  No needs    Goals Addressed             This Visit's Progress    COMPLETED: care coordination activity       Care Coordination Interventions: Reviewed medications with patient and discussed adherence with no needed refills Reviewed scheduled/upcoming provider appointments including sufficient transportation source Screening for signs and symptoms of depression related to chronic disease state  Assessed social determinant of health barriers Educated on care management services involving nurse care manager, social workers, and pharmacy for future needs. Pt denied any needs at this time however appreciative for the provided information.          SDOH assessments and interventions completed:  Yes  SDOH Interventions Today    Flowsheet Row Most Recent Value  SDOH Interventions   Food Insecurity Interventions Intervention Not Indicated  Housing Interventions Intervention Not Indicated  Transportation Interventions Intervention Not Indicated  Utilities Interventions Intervention Not Indicated        Care Coordination Interventions:  Yes, provided   Follow up plan: No further intervention required.   Encounter Outcome:  Pt. Visit Completed   Raina Mina, RN Care Management Coordinator [place] Office [phone]

## 2022-03-14 NOTE — Patient Instructions (Signed)
Visit Information  Thank you for taking time to visit with me today. Please don't hesitate to contact me if I can be of assistance to you.   Following are the goals we discussed today:   Goals Addressed             This Visit's Progress    COMPLETED: care coordination activity       Care Coordination Interventions: Reviewed medications with patient and discussed adherence with no needed refills Reviewed scheduled/upcoming provider appointments including sufficient transportation source Screening for signs and symptoms of depression related to chronic disease state  Assessed social determinant of health barriers Educated on care management services involving nurse care manager, social workers, and pharmacy for future needs. Pt denied any needs at this time however appreciative for the provided information.          Please call the care guide team at [phone] if you need to cancel or reschedule your appointment.   If you are experiencing a Mental Health or [place] or need someone to talk to, please call the Suicide and Crisis Lifeline: 988  Patient verbalizes understanding of instructions and care plan provided today and agrees to view in [place]. Active MyChart status and patient understanding of how to access instructions and care plan via MyChart confirmed with patient.     No further follow up required: No follow up needs  Raina Mina, RN Care Management Coordinator [place] Office [phone]

## 2022-03-18 ENCOUNTER — Other Ambulatory Visit: Payer: Medicare HMO

## 2022-03-22 NOTE — Telephone Encounter (Signed)
336 

## 2022-03-25 ENCOUNTER — Encounter: Payer: Self-pay | Admitting: Internal Medicine

## 2022-03-25 MED ORDER — VITAMIN D 50 MCG (2000 UT) PO CAPS: 1.0000 | ORAL_CAPSULE | Freq: Every day | ORAL | 1 refills | Status: DC

## 2022-04-01 ENCOUNTER — Other Ambulatory Visit (HOSPITAL_COMMUNITY): Payer: Self-pay

## 2022-04-14 ENCOUNTER — Other Ambulatory Visit: Payer: Self-pay | Admitting: Cardiovascular Disease

## 2022-04-16 NOTE — Telephone Encounter (Signed)
Prescription refill request for Xarelto received.  Indication:afib Last office visit:5/23 Weight:68.9  kg Age:72 Scr:0.7 8/23 CrCl:81.34  ml/min  Prescription refilled

## 2022-04-17 NOTE — Telephone Encounter (Signed)
Patient should see Cardiologist on next appointment before refill.

## 2022-04-18 ENCOUNTER — Ambulatory Visit (INDEPENDENT_AMBULATORY_CARE_PROVIDER_SITE_OTHER): Payer: Medicare HMO

## 2022-04-18 DIAGNOSIS — I442 Atrioventricular block, complete: Secondary | ICD-10-CM | POA: Diagnosis not present

## 2022-04-18 LAB — CUP PACEART REMOTE DEVICE CHECK
Battery Remaining Longevity: 58 mo
Battery Remaining Percentage: 71 %
Battery Voltage: 2.98 V
Brady Statistic AP VP Percent: 1 %
Brady Statistic AP VS Percent: 1 %
Brady Statistic AS VP Percent: 99 %
Brady Statistic AS VS Percent: 1 %
Brady Statistic RA Percent Paced: 1 %
Date Time Interrogation Session: 20240208040033
Implantable Lead Connection Status: 753985
Implantable Lead Connection Status: 753985
Implantable Lead Connection Status: 753985
Implantable Lead Implant Date: 20140721
Implantable Lead Implant Date: 20141117
Implantable Lead Implant Date: 20141117
Implantable Lead Location: 753858
Implantable Lead Location: 753859
Implantable Lead Location: 753860
Implantable Lead Model: 5071
Implantable Pulse Generator Implant Date: 20220207
Lead Channel Impedance Value: 390 Ohm
Lead Channel Impedance Value: 400 Ohm
Lead Channel Impedance Value: 580 Ohm
Lead Channel Pacing Threshold Amplitude: 0.5 V
Lead Channel Pacing Threshold Amplitude: 0.75 V
Lead Channel Pacing Threshold Amplitude: 1.25 V
Lead Channel Pacing Threshold Pulse Width: 0.4 ms
Lead Channel Pacing Threshold Pulse Width: 0.4 ms
Lead Channel Pacing Threshold Pulse Width: 0.5 ms
Lead Channel Sensing Intrinsic Amplitude: 1.2 mV
Lead Channel Sensing Intrinsic Amplitude: 11.3 mV
Lead Channel Setting Pacing Amplitude: 2 V
Lead Channel Setting Pacing Amplitude: 2.5 V
Lead Channel Setting Pacing Amplitude: 2.5 V
Lead Channel Setting Pacing Pulse Width: 0.4 ms
Lead Channel Setting Pacing Pulse Width: 0.5 ms
Lead Channel Setting Sensing Sensitivity: 2 mV
Pulse Gen Model: 3222
Pulse Gen Serial Number: 3859407

## 2022-05-08 NOTE — Progress Notes (Signed)
Remote pacemaker transmission.   

## 2022-05-12 ENCOUNTER — Encounter: Payer: Self-pay | Admitting: Cardiovascular Disease

## 2022-05-12 ENCOUNTER — Other Ambulatory Visit: Payer: Self-pay | Admitting: Cardiovascular Disease

## 2022-05-12 DIAGNOSIS — I10 Essential (primary) hypertension: Secondary | ICD-10-CM

## 2022-05-13 ENCOUNTER — Encounter: Payer: Self-pay | Admitting: Internal Medicine

## 2022-05-13 ENCOUNTER — Ambulatory Visit (INDEPENDENT_AMBULATORY_CARE_PROVIDER_SITE_OTHER): Payer: Medicare HMO | Admitting: Internal Medicine

## 2022-05-13 VITALS — BP 110/80 | HR 100 | Temp 98.3°F | Wt 148.2 lb

## 2022-05-13 DIAGNOSIS — H1013 Acute atopic conjunctivitis, bilateral: Secondary | ICD-10-CM

## 2022-05-13 MED ORDER — CARVEDILOL 12.5 MG PO TABS: ORAL_TABLET | ORAL | 0 refills | Status: DC

## 2022-05-13 NOTE — Progress Notes (Signed)
Established Patient Office Visit     CC/Reason for Visit: Eyes are watery and itching  HPI: [name] is a 72 y.o. female who is coming in today for the above mentioned reasons.  For about 5 days she has had watery eyes that have been very itchy.  She works at an [employer] and several kids were sent home last week with conjunctivitis.  She spent the weekend outside, sits on her front porch routinely.   Past Medical/Surgical History: Past Medical History:  Diagnosis Date   Acute on chronic combined systolic and diastolic CHF, NYHA class 3 ([place]) 03/19/2013   AICD (automatic cardioverter/defibrillator) present 01/2013   Biventricular cardiac pacemaker in situ    Allergic rhinitis    Anogenital warts 01/22/2011   Anxiety state, unspecified 05/13/2013   BREAST CANCER, HX OF 09/19/2009   Qualifier: Diagnosis of  By: [name], [name]    CAD (coronary artery disease)    a. 2004: s/p MI in [place]. No PCI->Medical RX;  b. 07/2012 Cath: LM 30-40, LAD 70p, 70/68m D1 80-90p, OM1 small 90p, OM2 large 80-90p, 534m70-80d, RCA 20-30 diff, EF 40%, glob HK.s/p CABG   [place] of left [place] ([place]   Patient reports left breast cancer diagnosis in 2002 treated with bilateral mastectomy positive lymph nodes with left axillary dissection followed by chemotherapy of unknown type   Cataract    Chronic combined systolic and diastolic CHF, NYHA class 2 ([place]10/31/2014   Diabetes mellitus type II    Exertional dyspnea 08/05/2014   Exertional shortness of breath    Generalized osteoarthrosis, involving multiple sites 08/25/2015   History of colon polyps    Hyperlipidemia    Hypertension    Incisional hernia, without obstruction or gangrene 05/04/2015   Ischemic cardiomyopathy    a. 07/2012 Echo: EF 35%, Sev inferoseptal HK, mildly dil LA, Peak PASP 5951m.   LBBB (left bundle branch block)    a. intermittent - present during rapid afib 07/2012.   MYOCARDIAL INFARCTION, HX OF  09/19/2009   Qualifier: Diagnosis of  By: TodSherren Mocha, JefJory Ee Neuromuscular disorder ([facility]  Patient reports chronic numbness in the right foot related to previous surgery on the right leg and "nerve damage"   PAF (paroxysmal atrial fibrillation) ([place]  a. 07/2012: Amio and xarelto initiated.   Right carotid bruit 08/11/2012   SBO (small bowel obstruction) (HCC)    SOB (shortness of breath) 02/26/2018   Type 2 diabetes mellitus with circulatory disorder, without long-term current use of insulin ([place]CC[place]7/25/2017   Vitamin D deficiency 02/22/2016    Past Surgical History:  Procedure Laterality Date   ABDOMINAL HYSTERECTOMY  2000   APPENDECTOMY  1974   BI-VENTRICULAR PACEMAKER INSERTION N/A 01/25/2013   Procedure: BI-VENTRICULAR PACEMAKER INSERTION (CRT-P);  Surgeon: GreEvans LanceD;  [facility];  Service: Cardiovascular;  Laterality: N/A;   BREAST BIOPSY Left 2002   CARDIAC CATHETERIZATION     CHOLECYSTECTOMY OPEN  1974   CORONARY ARTERY BYPASS GRAFT N/A 09/28/2012   Procedure: CORONARY ARTERY BYPASS GRAFTING (CABG);  Surgeon: EdwGrace IsaacD;  [facility];  Laterality: N/A;  CABG x four, using left internal mammary artery and left leg greater saphenous vein harvested endoscopically   DILATION AND CURETTAGE OF UTERUS  1983   EPICARDIAL PACING LEAD PLACEMENT N/A 09/28/2012   Procedure: EPICARDIAL PACING LEAD PLACEMENT;  Surgeon: EdwPercell Miller  Maryruth Bun, MD;  Location: [place];  Service: Thoracic;  Laterality: N/A;  LV LEAD PLACEMENT   HERNIA REPAIR     INCISIONAL HERNIA REPAIR N/A 05/25/2015   Procedure: LAPAROSCOPIC INCISIONAL HERNIA WITH MESH ;  Surgeon: [name], MD;  Location: [place];  Service: General;  Laterality: N/A;   INCONTINENCE SURGERY  2000   INSERTION OF MESH N/A 05/25/2015   Procedure: INSERTION OF MESH;  Surgeon: [name], MD;  Location: [place];  Service: General;  Laterality: N/A;   INTRAOPERATIVE  TRANSESOPHAGEAL ECHOCARDIOGRAM N/A 09/28/2012   Procedure: INTRAOPERATIVE TRANSESOPHAGEAL ECHOCARDIOGRAM;  Surgeon: [name], MD;  Location: [place];  Service: Open Heart Surgery;  Laterality: N/A;   LAPAROSCOPIC INCISIONAL / UMBILICAL / VENTRAL HERNIA REPAIR  05/25/2015   IHR   LEFT HEART CATHETERIZATION WITH CORONARY ANGIOGRAM N/A 07/20/2012   Procedure: LEFT HEART CATHETERIZATION WITH CORONARY ANGIOGRAM;  Surgeon: [name], MD;  [facility];  Service: Cardiovascular;  Laterality: N/A;   MASTECTOMY Right 2002   MASTECTOMY MODIFIED RADICAL W/ AXILLARY LYMPH NODES W/ OR W/O PECTORALIS MINOR Left 2002   MAZE N/A 09/28/2012   Procedure: MAZE;  Surgeon: [name], MD;  Location: [place];  Service: Open Heart Surgery;  Laterality: N/A;   ORIF WRIST FRACTURE Right 11/08/2017   Procedure: OPEN REDUCTION INTERNAL FIXATION (ORIF) WRIST FRACTURE;  Surgeon: [name], MD;  Location: [place];  Service: Orthopedics;  Laterality: Right;  90 mins   PPM GENERATOR CHANGEOUT N/A 04/17/2020   Procedure: PPM GENERATOR CHANGEOUT;  Surgeon: [name], MD;  Location: [place] CV LAB;  Service: Cardiovascular;  Laterality: N/A;   TENDON REPAIR Right 2001 X 3-4   torn ligaments and tendons in ankle up to knee from work related accident   [place]    Social History:  reports that she quit smoking about 10 years ago. Her smoking use included cigarettes. She has a 10.00 pack-year smoking history. She has never used smokeless tobacco. She reports that she does not drink alcohol and does not use drugs.  Allergies: Allergies  Allergen Reactions   Statins Itching    Myalgias with rosuvastatin, atorvastatin and simvastatin     Family History: Family History  Problem Relation Age of Onset   Kidney failure Mother    Heart disease Mother        Died mid 0000000 complications of diabetes   Diabetes Mother    Colon cancer Sister    Arthritis Other    Diabetes Other     Hyperlipidemia Other    Ovarian cancer Other    Liver cancer Brother    Stomach cancer Neg Hx    Rectal cancer Neg Hx    Esophageal cancer Neg Hx    Pancreatic cancer Neg Hx      Current Outpatient Medications:    APPLE CIDER VINEGAR PO, Take 5,220 mg by mouth in the morning, at noon, and at bedtime. 1740 mg each, Disp: , Rfl:    Blood Glucose Monitoring Suppl (TRUE METRIX AIR GLUCOSE METER) DEVI, Check blood sugar 2 times a day, Disp: 1 each, Rfl: 0   carvedilol (COREG) 12.5 MG tablet, Take 25 mg (2 x 12.5 mg tabs) twice daily., Disp: 120 tablet, Rfl: 0   Cholecalciferol (VITAMIN D) 50 MCG (2000 UT) CAPS, Take 1 capsule (2,000 Units total) by mouth daily., Disp: 90 capsule, Rfl: 1   empagliflozin (JARDIANCE) 25 MG TABS tablet, TAKE 1 TABLET EVERY DAY  BEFORE BREAKFAST, Disp: 90 tablet, Rfl: 1   furosemide (LASIX) 20 MG tablet, TAKE 1 TABLET EVERY DAY, Disp: 90 tablet, Rfl: 3   glipiZIDE (GLUCOTROL) 5 MG tablet, TAKE 1 TABLET TWICE DAILY  BEFORE  MEALS, Disp: 180 tablet, Rfl: 1   glucose blood (TRUE METRIX BLOOD GLUCOSE TEST) test strip, Use to check blood sugar 2 times a day., Disp: 200 each, Rfl: 12   isosorbide mononitrate (IMDUR) 60 MG 24 hr tablet, TAKE 1 TABLET EVERY DAY, Disp: 90 tablet, Rfl: 3   losartan (COZAAR) 25 MG tablet, TAKE 1 TABLET TWICE DAILY, Disp: 180 tablet, Rfl: 3   metFORMIN (GLUCOPHAGE) 1000 MG tablet, TAKE 1 TABLET TWICE DAILY WITH FOOD, Disp: 180 tablet, Rfl: 1   nitroGLYCERIN (NITROSTAT) 0.4 MG SL tablet, Place 1 tablet (0.4 mg total) under the tongue every 5 (five) minutes as needed for chest pain., Disp: 25 tablet, Rfl: 3   REPATHA SURECLICK XX123456 MG/ML SOAJ, INJECT 140 MG INTO THE SKIN EVERY 14 (FOURTEEN) DAYS., Disp: 6 mL, Rfl: 3   TRUEplus Lancets 33G MISC, Use to check blood sugar 2 times a day., Disp: 200 each, Rfl: 12   TRULICITY 1.5 0000000 SOPN, INJECT 1.'5MG'$  (1 PEN) UNDER THE SKIN EVERY WEEK, Disp: 2.5 mL, Rfl: 2   XARELTO 20 MG TABS tablet, TAKE 1 TABLET  EVERY DAY WITH SUPPER, Disp: 90 tablet, Rfl: 3  Review of Systems:  Negative unless indicated in HPI.   Physical Exam: Vitals:   05/13/22 1103  BP: 110/80  Pulse: 100  Temp: 98.3 F (36.8 C)  TempSrc: Oral  SpO2: 98%  Weight: 148 lb 3.2 oz (67.2 kg)    Body mass index is 25.44 kg/m.   Physical Exam Vitals reviewed.  Constitutional:      Appearance: Normal appearance.  HENT:     Mouth/Throat:     Mouth: Mucous membranes are moist.     Pharynx: Oropharynx is clear.  Eyes:     Conjunctiva/sclera:     Right eye: Right conjunctiva is injected.     Left eye: Left conjunctiva is injected.     Pupils: Pupils are equal, round, and reactive to light.  Neurological:     Mental Status: She is alert.      Impression and Plan:  Allergic conjunctivitis of both eyes  -This is more likely allergic conjunctivitis than viral.  Have advised daily antihistamine use as well as Pataday eyedrops.  She knows to follow-up if symptoms fail to improve.  Time spent:22 minutes reviewing chart, interviewing and examining patient and formulating plan of care.     Lelon Frohlich, MD [facility]

## 2022-05-13 NOTE — Telephone Encounter (Signed)
error 

## 2022-05-15 ENCOUNTER — Other Ambulatory Visit: Payer: Self-pay | Admitting: Internal Medicine

## 2022-05-15 DIAGNOSIS — I25810 Atherosclerosis of coronary artery bypass graft(s) without angina pectoris: Secondary | ICD-10-CM

## 2022-05-15 DIAGNOSIS — E1159 Type 2 diabetes mellitus with other circulatory complications: Secondary | ICD-10-CM

## 2022-05-15 DIAGNOSIS — E782 Mixed hyperlipidemia: Secondary | ICD-10-CM

## 2022-05-15 DIAGNOSIS — I480 Paroxysmal atrial fibrillation: Secondary | ICD-10-CM

## 2022-05-15 DIAGNOSIS — I252 Old myocardial infarction: Secondary | ICD-10-CM

## 2022-05-19 IMAGING — CT CT ABD-PELV W/ CM
1 of 3 series · 13 of 32 positions shown, 19 images · IV contrast (iopamidol)
Comparison: CT abdomen pelvis dated 07/18/2016.

CLINICAL DATA: 68-year-old female with lower abdominal pain.
History of prior hernia repair with mesh.

EXAM:
CT ABDOMEN AND PELVIS WITH CONTRAST
TECHNIQUE: Multidetector CT imaging of the abdomen and pelvis was performed
using the standard protocol following bolus administration of
intravenous contrast.
CONTRAST:  100mL [id number] IOPAMIDOL (0PW3KH-WDD) INJECTION 61%

[Series 2: abd/pelvis w/cm · axial · 0.71mm/px · z∈[-437,-67]mm · 13 of 86 slices shown, 19 images]
[im 6/86  soft-tissue]
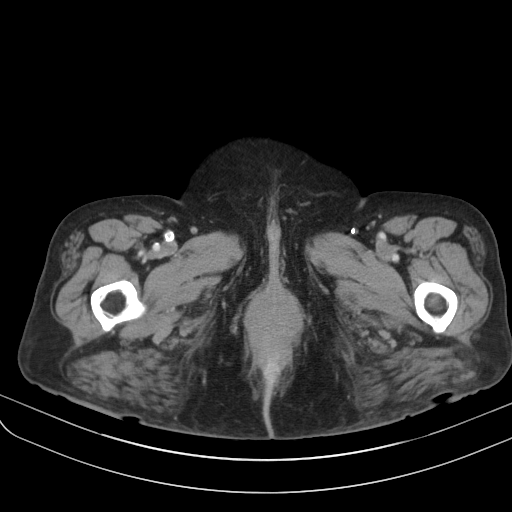
[im 6/86  bone]
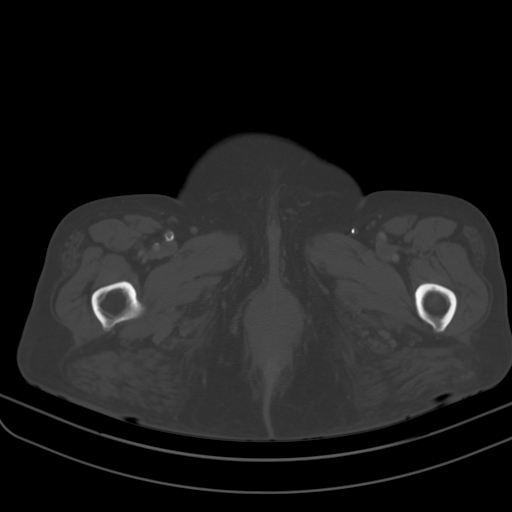
[im 12/86  soft-tissue]
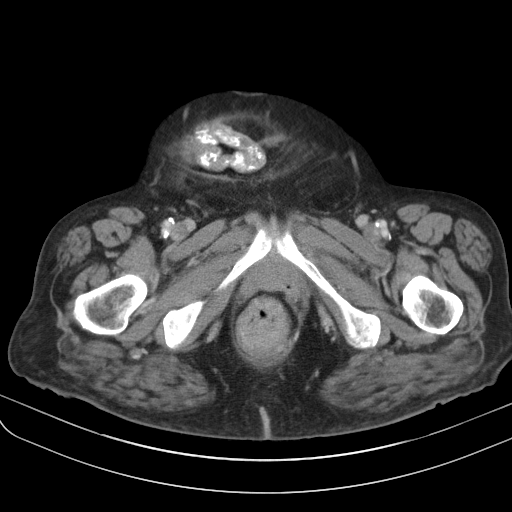
[im 18/86  soft-tissue]
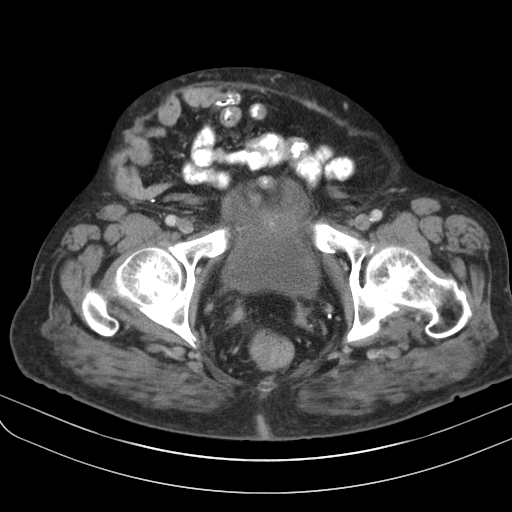
[im 23/86  soft-tissue]
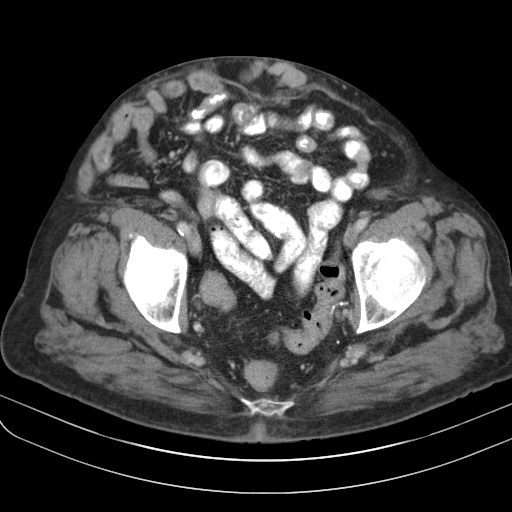
[im 29/86  soft-tissue]
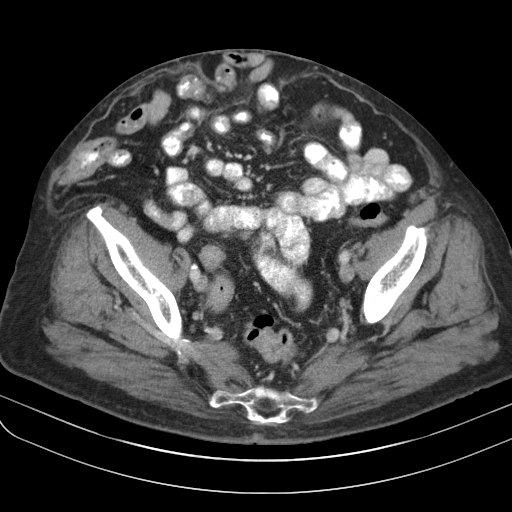
[im 35/86  soft-tissue]
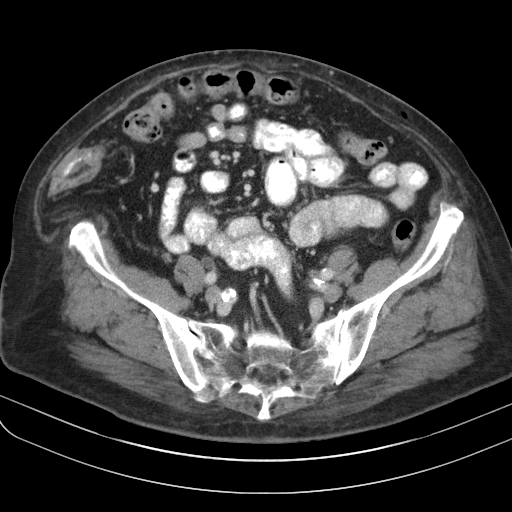
[im 46/86  soft-tissue]
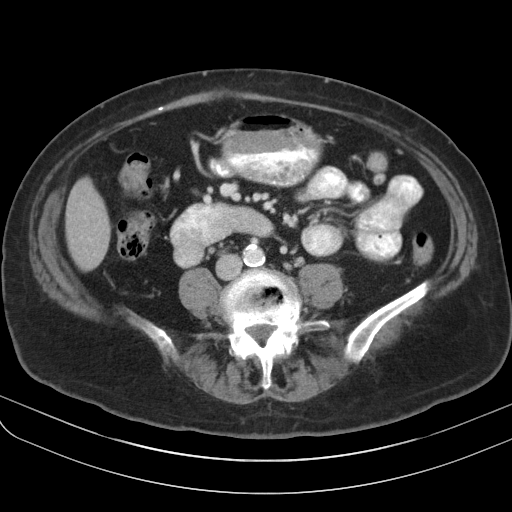
[im 52/86  soft-tissue]
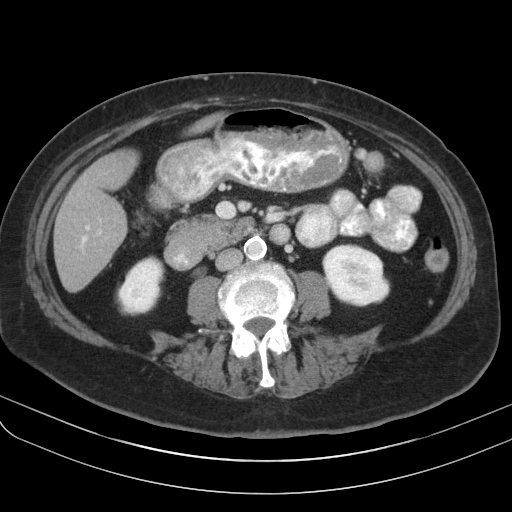
[im 57/86  soft-tissue]
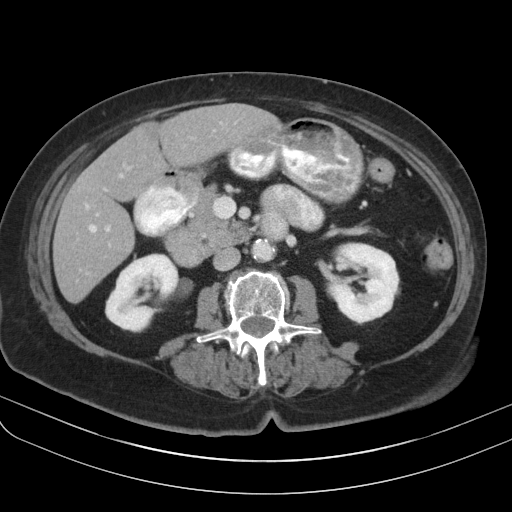
[im 57/86  bone]
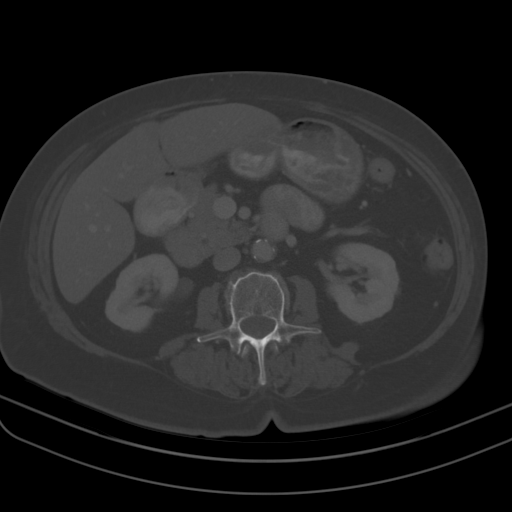
[im 63/86  soft-tissue]
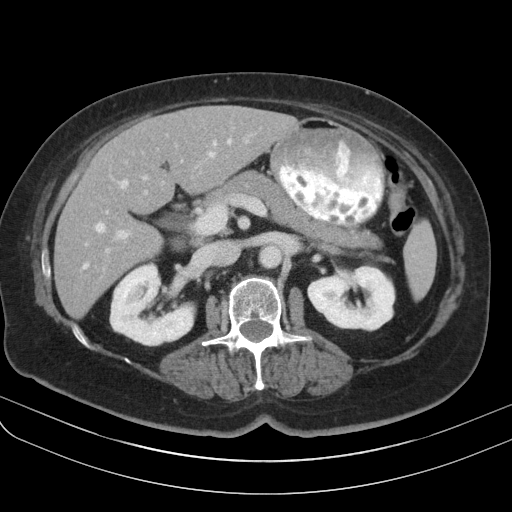
[im 63/86  lung]
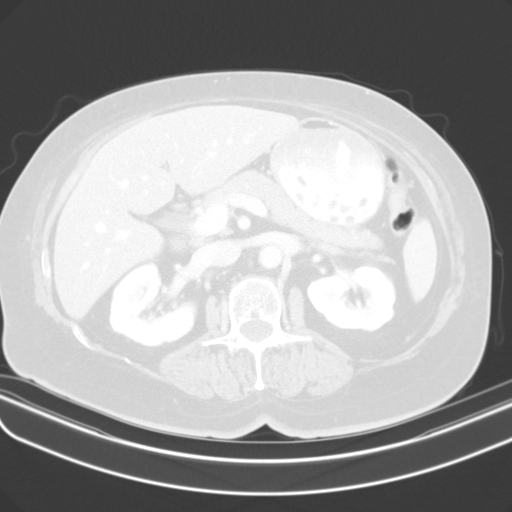
[im 69/86  soft-tissue]
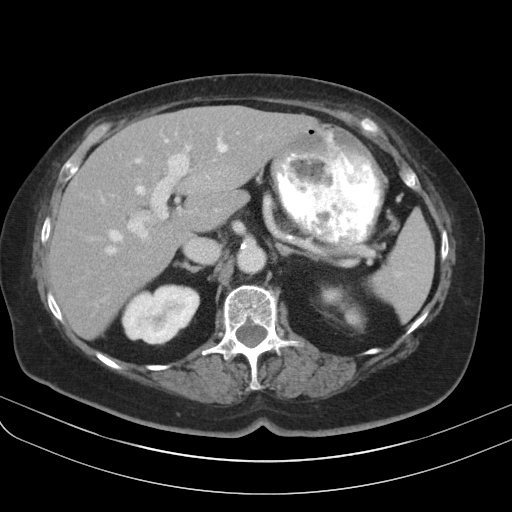
[im 69/86  lung]
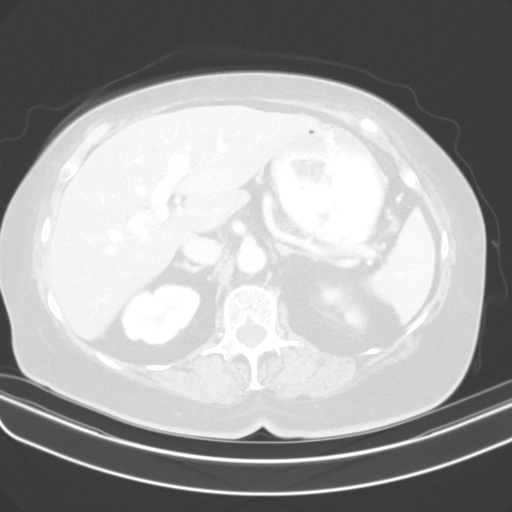
[im 74/86  soft-tissue]
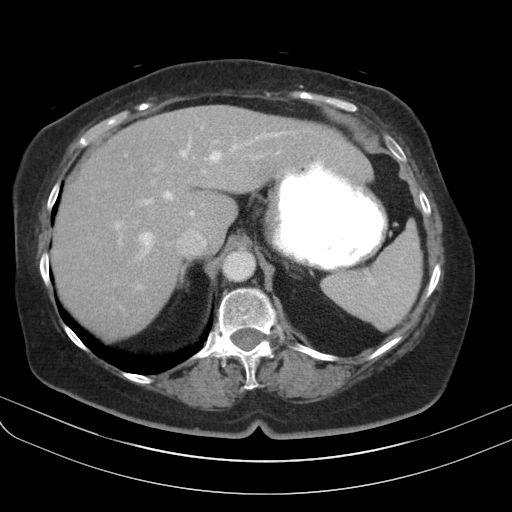
[im 74/86  lung]
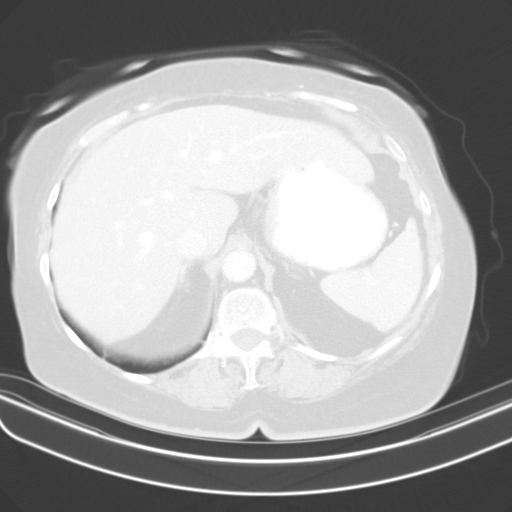
[im 80/86  soft-tissue]
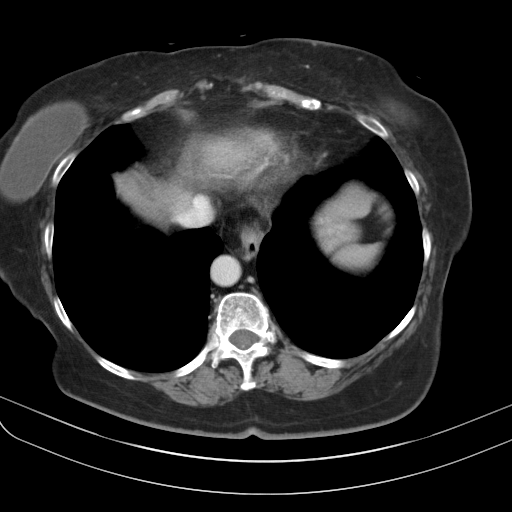
[im 80/86  lung]
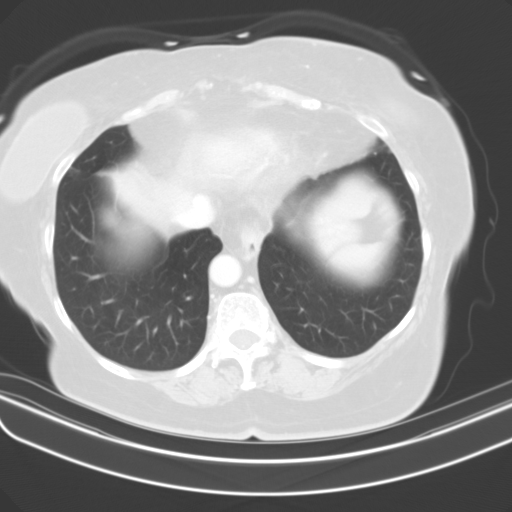

[13 of 32 positions shown; findings below may reference images not displayed]

FINDINGS: Lower chest: The visualized lung bases are clear. Pacemaker wires
noted

No intra-abdominal free air or free fluid.

Hepatobiliary: Probable mild fatty liver. No intrahepatic biliary
dilatation. Cholecystectomy.

Pancreas: Unremarkable. No pancreatic ductal dilatation or
surrounding inflammatory changes.

Spleen: Normal in size without focal abnormality.

Adrenals/Urinary Tract: The adrenal glands unremarkable. There is no
hydronephrosis on either side. There is symmetric enhancement and
excretion of contrast by both kidneys. The visualized ureters and
urinary bladder appear unremarkable.

Stomach/Bowel: There is no bowel obstruction or active inflammation.
There are scattered colonic diverticula without active inflammatory
changes. Appendectomy.

Vascular/Lymphatic: Advanced aortoiliac atherosclerotic disease. The
IVC is unremarkable. No portal venous gas. There is no adenopathy.

Reproductive: Hysterectomy. No pelvic mass.

Other: Prior ventral hernia repair mesh. There is approximately 3 cm
defect in the mesh in the anterior pelvic wall (60/2) with
herniation of several loops of small bowel. No evidence of
obstruction.

Musculoskeletal: Osteopenia with degenerative changes of the spine.
No acute osseous pathology.
IMPRESSION: 1. Prior ventral hernia repair mesh. There is approximately 3 cm
defect in the mesh in the anterior pelvic wall with herniation of
several loops of small bowel. No evidence of obstruction.
2. Colonic diverticulosis.
3. Aortic Atherosclerosis ([id number]).

## 2022-05-30 ENCOUNTER — Encounter: Payer: Self-pay | Admitting: Internal Medicine

## 2022-05-30 ENCOUNTER — Other Ambulatory Visit: Payer: Self-pay

## 2022-05-30 DIAGNOSIS — E1159 Type 2 diabetes mellitus with other circulatory complications: Secondary | ICD-10-CM

## 2022-05-30 MED ORDER — METFORMIN HCL 1000 MG PO TABS: ORAL_TABLET | ORAL | 1 refills | Status: DC

## 2022-05-30 MED ORDER — GLIPIZIDE 5 MG PO TABS: 5.0000 mg | ORAL_TABLET | Freq: Two times a day (BID) | ORAL | 1 refills | Status: DC

## 2022-05-30 MED ORDER — TRULICITY 1.5 MG/0.5ML ~~LOC~~ SOAJ: 1.5000 mg | SUBCUTANEOUS | 2 refills | Status: DC

## 2022-05-30 MED ORDER — EMPAGLIFLOZIN 25 MG PO TABS: ORAL_TABLET | ORAL | 1 refills | Status: DC

## 2022-06-06 ENCOUNTER — Other Ambulatory Visit: Payer: Self-pay | Admitting: Cardiovascular Disease

## 2022-06-13 ENCOUNTER — Other Ambulatory Visit: Payer: Self-pay

## 2022-06-13 MED ORDER — CARVEDILOL 12.5 MG PO TABS: ORAL_TABLET | ORAL | 3 refills | Status: DC

## 2022-06-17 DIAGNOSIS — Z961 Presence of intraocular lens: Secondary | ICD-10-CM | POA: Diagnosis not present

## 2022-06-17 DIAGNOSIS — E113393 Type 2 diabetes mellitus with moderate nonproliferative diabetic retinopathy without macular edema, bilateral: Secondary | ICD-10-CM | POA: Diagnosis not present

## 2022-06-17 DIAGNOSIS — D3132 Benign neoplasm of left choroid: Secondary | ICD-10-CM | POA: Diagnosis not present

## 2022-06-17 DIAGNOSIS — H35033 Hypertensive retinopathy, bilateral: Secondary | ICD-10-CM | POA: Diagnosis not present

## 2022-06-17 LAB — HM DIABETES EYE EXAM

## 2022-07-07 NOTE — Progress Notes (Unsigned)
Cardiology Office Note:    Date:  07/07/2022   ID:  [name], DOB Nov 30, 1950, MRN [id number]  PCP:  Philip Aspen, Michelle Patricia, MD  Cardiologist:  Thurmon Fair, MD    Referring MD: Philip Aspen, Estel*   No chief complaint on file.  CRT-P  History of Present Illness:    [name] is a 72 y.o. female with a hx of CAD s/p CABG 2014, paroxysmal atrial fibrillation, CHF with recovery of EF after CRT-P (St. Jude), type 2 diabetes mellitus, hypercholesterolemia, hypertension returning for follow-up.  She does not have any complaints of angina or exertional dyspnea. She complains of feeling tired a lot.  She wakes up feeling tired.  Recent hemoglobin and TSH levels were normal.  She does not have any symptoms of depression.  She states that her husband reports that she snores infrequently and has never reported periods of apnea.  She denies palpitations dizziness or syncope.  She has not had lower extremity edema, orthopnea or PND.  Device interrogation shows normal function: She does not require atrial pacing and has a normal heart rate distribution and based on her histograms and has 100% biventricular pacing.  There have been no episodes of high ventricular rates.  She had a single brief episode of paroxysmal atrial fibrillation, the first in many years, about a month ago.  Estimated generator longevity is 5 years.  All lead parameters are stable.  No evidence of fluid overload recently based on thoracic impedance.  A new finding today is that she does not have any native AV conduction.  She has developed complete heart block and is now pacemaker dependent.  She might require another surgery for her periumbilical hernia, performed by a specialist in [place], since she has had many previous abdominal surgeries  Glycemic control has been excellent with hemoglobin A1c 6.6%.  Her most recent LDL cholesterol was 48 (she is due for repeat labs next month).  Her most recent  functional study was a nuclear stress test in February 2021, showing LVEF 51%.  She has not had an echocardiogram since 2016, similar estimation of EF 50-55%.  ECG is nondiagnostic due to biventricular pacing (she had an LBBB before).  Nuclear stress test in February 2021 showed very minor abnormalities: A previously described fixed apical defect, and a possible small area of basal inferolateral ischemia.  EF was 51%.  She has not had repeat cardiac catheterization since her bypass surgery in 2014.  Mrs. [name] presented with myocardial infarction in 2004. In May 2014 presented with congestive heart failure and atrial fibrillation in the setting of three-vessel coronary artery disease leading to bypass surgery (July 2014, Dr. Tyrone Sage, LIMA to LAD, SVG to ramus intermedius, sequential SVG to OM1 and OM 2, surgical maze procedure). She had persistent depressed left ventricular systolic function and received a dual-chamber biventricular pacemaker (St. Jude Allure, Dr. Ladona Ridgel in November 2014).   Last echo February 2016 shows recovery of left ventricular ejection fraction 50-55%. In January 2015, she was admitted with acute on chronic heart failure due to excessive intravenous fluid administration during an episode of small bowel obstruction.. She has type 2 diabetes mellitus that has been difficult to control as well as hypertension and hyperlipidemia.  Nuclear stress test 04/28/2019 The left ventricular ejection fraction is mildly decreased (45-54%). Nuclear stress EF: 51%. There was no ST segment deviation noted during stress. No T wave inversion was noted during stress. Defect 1: There is a small defect of mild severity present in  the apex location. Defect 2: There is a small defect of mild severity present in the basal inferolateral location. This is a low risk study. Findings consistent with prior myocardial infarction.   Low risk nuclear scan with a small apical scar (previously described) and a  possibly new small area of mild inferolateral ischemia (sum difference 4%) and borderline reduction in left ventricular systolic function (EF51%).   Past Medical History:  Diagnosis Date   Acute on chronic combined systolic and diastolic CHF, NYHA class 3 (HCC) 03/19/2013   AICD (automatic cardioverter/defibrillator) present 01/2013   Biventricular cardiac pacemaker in situ    Allergic rhinitis    Anogenital warts 01/22/2011   Anxiety state, unspecified 05/13/2013   BREAST CANCER, HX OF 09/19/2009   Qualifier: Diagnosis of  By: Tawanna Cooler MD, Tinnie Gens A    CAD (coronary artery disease)    a. 2004: s/p MI in [place]. No PCI->Medical RX;  b. 07/2012 Cath: LM 30-40, LAD 70p, 70/85m, D1 80-90p, OM1 small 90p, OM2 large 80-90p, 34m, 70-80d, RCA 20-30 diff, EF 40%, glob HK.s/p CABG   Cancer of left breast (HCC) 2002   Patient reports left breast cancer diagnosis in 2002 treated with bilateral mastectomy positive lymph nodes with left axillary dissection followed by chemotherapy of unknown type   Cataract    Chronic combined systolic and diastolic CHF, NYHA class 2 (HCC) 01/08/2013   Diabetes mellitus type II    Exertional dyspnea 08/05/2014   Exertional shortness of breath    Generalized osteoarthrosis, involving multiple sites 08/25/2015   History of colon polyps    Hyperlipidemia    Hypertension    Incisional hernia, without obstruction or gangrene 05/04/2015   Ischemic cardiomyopathy    a. 07/2012 Echo: EF 35%, Sev inferoseptal HK, mildly dil LA, Peak PASP .   LBBB (left bundle branch block)    a. intermittent - present during rapid afib 07/2012.   MYOCARDIAL INFARCTION, HX OF 09/19/2009   Qualifier: Diagnosis of  By: Tawanna Cooler MD, Eugenio Hoes    Neuromuscular disorder [facility])    Patient reports chronic numbness in the right foot related to previous surgery on the right leg and "nerve damage"   PAF (paroxysmal atrial fibrillation) (HCC)    a. 07/2012: Amio and xarelto initiated.   Right carotid  bruit 08/11/2012   SBO (small bowel obstruction) (HCC)    SOB (shortness of breath) 02/26/2018   Type 2 diabetes mellitus with circulatory disorder, without long-term current use of insulin (HCC) 10/03/2015   Vitamin D deficiency 02/22/2016    Past Surgical History:  Procedure Laterality Date   ABDOMINAL HYSTERECTOMY  2000   APPENDECTOMY  1974   BI-VENTRICULAR PACEMAKER INSERTION N/A 01/25/2013   Procedure: BI-VENTRICULAR PACEMAKER INSERTION (CRT-P);  Surgeon: Marinus Maw, MD;  Location[facility] CATH LAB;  Service: Cardiovascular;  Laterality: N/A;   BREAST BIOPSY Left 2002   CARDIAC CATHETERIZATION     CHOLECYSTECTOMY OPEN  1974   CORONARY ARTERY BYPASS GRAFT N/A 09/28/2012   Procedure: CORONARY ARTERY BYPASS GRAFTING (CABG);  Surgeon: Delight Ovens, MD;  Location: [facility] OR;  Service: Open Heart Surgery;  Laterality: N/A;  CABG x four, using left internal mammary artery and left leg greater saphenous vein harvested endoscopically   DILATION AND CURETTAGE OF UTERUS  1983   EPICARDIAL PACING LEAD PLACEMENT N/A 09/28/2012   Procedure: EPICARDIAL PACING LEAD PLACEMENT;  Surgeon: Delight Ovens, MD;  Location: [facility] OR;  Service: Thoracic;  Laterality: N/A;  LV LEAD PLACEMENT  HERNIA REPAIR     INCISIONAL HERNIA REPAIR N/A 05/25/2015   Procedure: LAPAROSCOPIC INCISIONAL HERNIA WITH MESH ;  Surgeon: Emelia Loron, MD;  Location: [facility] OR;  Service: General;  Laterality: N/A;   INCONTINENCE SURGERY  2000   INSERTION OF MESH N/A 05/25/2015   Procedure: INSERTION OF MESH;  Surgeon: Emelia Loron, MD;  Location: [facility] OR;  Service: General;  Laterality: N/A;   INTRAOPERATIVE TRANSESOPHAGEAL ECHOCARDIOGRAM N/A 09/28/2012   Procedure: INTRAOPERATIVE TRANSESOPHAGEAL ECHOCARDIOGRAM;  Surgeon: Delight Ovens, MD;  Location: [facility] OR;  Service: Open Heart Surgery;  Laterality: N/A;   LAPAROSCOPIC INCISIONAL / UMBILICAL / VENTRAL HERNIA REPAIR  05/25/2015   IHR   LEFT HEART CATHETERIZATION WITH CORONARY  ANGIOGRAM N/A 07/20/2012   Procedure: LEFT HEART CATHETERIZATION WITH CORONARY ANGIOGRAM;  Surgeon: Peter M Swaziland, MD;  Location: [facility] CATH LAB;  Service: Cardiovascular;  Laterality: N/A;   MASTECTOMY Right 2002   MASTECTOMY MODIFIED RADICAL W/ AXILLARY LYMPH NODES W/ OR W/O PECTORALIS MINOR Left 2002   MAZE N/A 09/28/2012   Procedure: MAZE;  Surgeon: Delight Ovens, MD;  Location: [facility] OR;  Service: Open Heart Surgery;  Laterality: N/A;   ORIF WRIST FRACTURE Right 11/08/2017   Procedure: OPEN REDUCTION INTERNAL FIXATION (ORIF) WRIST FRACTURE;  Surgeon: Dominica Severin, MD;  Location: [facility] OR;  Service: Orthopedics;  Laterality: Right;  90 mins   PPM GENERATOR CHANGEOUT N/A 04/17/2020   Procedure: PPM GENERATOR CHANGEOUT;  Surgeon: Thurmon Fair, MD;  Location: [facility] INVASIVE CV LAB;  Service: Cardiovascular;  Laterality: N/A;   TENDON REPAIR Right 2001 X 3-4   torn ligaments and tendons in ankle up to knee from work related accident   TUBAL LIGATION  1987    Current Medications: No outpatient medications have been marked as taking for the 07/08/22 encounter (Appointment) with Ertha Nabor, Rachelle Hora, MD.     Allergies:   Statins   Social History   Socioeconomic History   Marital status: Married    Spouse name: Not on file   Number of children: 3   Years of education: Not on file   Highest education level: GED or equivalent  Occupational History   Occupation: Office work    Associate Professor: [employer].  Tobacco Use   Smoking status: Former    Packs/day: 1.00    Years: 10.00    Additional pack years: 0.00    Total pack years: 10.00    Types: Cigarettes    Quit date: 03/11/2012    Years since quitting: 10.3   Smokeless tobacco: Never  Vaping Use   Vaping Use: Never used  Substance and Sexual Activity   Alcohol use: No   Drug use: No   Sexual activity: Yes    Partners: Male    Birth control/protection: Surgical  Other Topics Concern   Not on file  Social History Narrative   Lives  with husband and mother in [place].  Regular exercise: walking. Caffeine use: 2 cups of coffee in the morning (on weekends).       Working: PT (school (afterschool).    Social Determinants of Health   Financial Resource Strain: Medium Risk (05/13/2022)   Overall Financial Resource Strain (CARDIA)    Difficulty of Paying Living Expenses: Somewhat hard  Food Insecurity: Food Insecurity Present (05/13/2022)   Hunger Vital Sign    Worried About Running Out of Food in the Last Year: Never true    Ran Out of Food in the Last Year: Sometimes true  Transportation Needs:  No Transportation Needs (05/13/2022)   PRAPARE - Administrator, Civil Service (Medical): No    Lack of Transportation (Non-Medical): No  Physical Activity: Unknown (05/13/2022)   Exercise Vital Sign    Days of Exercise per Week: 0 days    Minutes of Exercise per Session: Not on file  Stress: Stress Concern Present (05/13/2022)   [employer] of Occupational Health - Occupational Stress Questionnaire    Feeling of Stress : Very much  Social Connections: Socially Integrated (05/13/2022)   Social Connection and Isolation Panel [NHANES]    Frequency of Communication with Friends and Family: More than three times a week    Frequency of Social Gatherings with Friends and Family: More than three times a week    Attends Religious Services: More than 4 times per year    Active Member of [employer] or Organizations: Yes    Attends Engineer, structural: More than 4 times per year    Marital Status: Married     Family History: The patient's family history includes Arthritis in an other family member; Colon cancer in her sister; Diabetes in her mother and another family member; Heart disease in her mother; Hyperlipidemia in an other family member; Kidney failure in her mother; Liver cancer in her brother; Ovarian cancer in an other family member. There is no history of Stomach cancer, Rectal cancer, Esophageal cancer, or Pancreatic  cancer. ROS:   Please see the history of present illness.    All other systems are reviewed and are negative.   EKGs/Labs/Other Studies Reviewed:   Nuclear stress test 04/28/2019 The left ventricular ejection fraction is mildly decreased (45-54%). Nuclear stress EF: 51%. There was no ST segment deviation noted during stress. No T wave inversion was noted during stress. Defect 1: There is a small defect of mild severity present in the apex location. Defect 2: There is a small defect of mild severity present in the basal inferolateral location. This is a low risk study. Findings consistent with prior myocardial infarction.   Low risk nuclear scan with a small apical scar (previously described) and a possibly new small area of mild inferolateral ischemia (sum difference 4%) and borderline reduction in left ventricular systolic function (EF51%).   Echocardiogram 2016:  - Left ventricle: The cavity size was normal. Wall thickness was    normal. Systolic function was normal. The estimated ejection    fraction was in the range of 50% to 55%. Doppler parameters are    consistent with abnormal left ventricular relaxation (grade 1    diastolic dysfunction).    EKG:  EKG is not ordered today.  The tracing performed just after generator change out shows atrial sensed, biventricular paced rhythm with prominent R in lead V1  Recent Labs: 10/12/2021: BUN 18; Creatinine, Ser 0.73; Hemoglobin 12.5; Platelets 231; Potassium 3.6; Sodium 138  Recent Lipid Panel    Component Value Date/Time   CHOL 135 07/31/2020 0917   TRIG 157 (H) 07/31/2020 0917   HDL 61 07/31/2020 0917   CHOLHDL 2.2 07/31/2020 0917   CHOLHDL 5.2 (H) 04/04/2016 0723   VLDL NOT CALC 04/04/2016 0723   LDLCALC 48 07/31/2020 0917   LDLDIRECT 50 04/04/2016 0723    Physical Exam:    VS:  There were no vitals taken for this visit.    Wt Readings from Last 3 Encounters:  05/13/22 148 lb 3.2 oz (67.2 kg)  01/17/22 152 lb (68.9  kg)  01/16/22 151 lb 11.2 oz (68.8  kg)     General: Alert, oriented x3, no distress, healthy left subclavian device site. Head: no evidence of trauma, PERRL, EOMI, no exophtalmos or lid lag, no myxedema, no xanthelasma; normal ears, nose and oropharynx Neck: normal jugular venous pulsations and no hepatojugular reflux; brisk carotid pulses without delay and no carotid bruits Chest: clear to auscultation, no signs of consolidation by percussion or palpation, normal fremitus, symmetrical and full respiratory excursions Cardiovascular: normal position and quality of the apical impulse, regular rhythm, normal first and second heart sounds, no murmurs, rubs or gallops Abdomen: no tenderness or distention, no masses by palpation, no abnormal pulsatility or arterial bruits, normal bowel sounds, no hepatosplenomegaly Extremities: no clubbing, cyanosis or edema; 2+ radial, ulnar and brachial pulses bilaterally; 2+ right femoral, posterior tibial and dorsalis pedis pulses; 2+ left femoral, posterior tibial and dorsalis pedis pulses; no subclavian or femoral bruits Neurological: grossly nonfocal Psych: Normal mood and affect   ASSESSMENT:    No diagnosis found.   PLAN:    In order of problems listed above:  CHB: New finding today. No underlying AV conduction or escape rhythm. Pacemaker dependent. Reinforced importance of avoiding electromagnetic interference. CAD s/p CABG: Denies angina.  She had a low risk (but not entirely normal) nuclear stress test in 2021. Preop angina was "like getting MSG in my [place] food", no recurrence.  Past episodes of abrupt onset and unpredictable chest discomfort appeared to improve following initiation of treatment with long-acting nitrates.  Not sure whether this is because we were treating coronary spasm or esophageal spasm.  Has not had these types of pain in a long time.    CHF: Last echo 2016, will repeat due to fatigue complaints. Excellent functional status,  asymptomatic (NYHA functional class I) and clinically euvolemic.  She is a CRT-P hyperresponder.   Prior to CRT-P LVEF was 35-40% and she had a lot of problems with heart failure exacerbation, but following resynchronization therapy EF was 50-55% and she has not had problems with heart failure in a very long time.  On ARB, carvedilol and SGLT2 inhibitor, very low-dose of loop diuretic. Fatigue: no clear cause. Does not have anemia, hypothyroidism, chronotropic incompetence or symptoms of depression. Getting home sleep study via PCP. DM: Most recent hemoglobin A1c was excellent at 6.6%. AFib: Very infrequent occurrence.  She did have a MAZE procedure at the time of bypass.   Continue anticoagulation. CHADSVasc 16 (age, gender,  CHF, CAD, DM, HTN). Xarelto: Denies falls or bleeding complications. HLP: Intolerant to multiple statins due to myopathy, but excellent results on Repatha.  Continue same medications. HTN: Well-controlled.  CRT-P: Well-healed after recent generator change out.  Continue remote downloads every 3 months.    There are no Patient Instructions on file for this visit.  . Medication Adjustments/Labs and Tests Ordered: Current medicines are reviewed at length with the patient today.  Concerns regarding medicines are outlined above.  No orders of the defined types were placed in this encounter.  No orders of the defined types were placed in this encounter.  There are no Patient Instructions on file for this visit.   Signed, Thurmon Fair, MD  07/07/2022 2:10 PM    [facility]

## 2022-07-08 ENCOUNTER — Ambulatory Visit: Payer: Medicare HMO | Attending: Cardiovascular Disease | Admitting: Cardiovascular Disease

## 2022-07-08 ENCOUNTER — Encounter: Payer: Self-pay | Admitting: Cardiovascular Disease

## 2022-07-08 VITALS — BP 112/64 | HR 88 | Ht 64.0 in | Wt 153.6 lb

## 2022-07-08 DIAGNOSIS — I2510 Atherosclerotic heart disease of native coronary artery without angina pectoris: Secondary | ICD-10-CM

## 2022-07-08 DIAGNOSIS — D6869 Other thrombophilia: Secondary | ICD-10-CM

## 2022-07-08 DIAGNOSIS — E1159 Type 2 diabetes mellitus with other circulatory complications: Secondary | ICD-10-CM | POA: Diagnosis not present

## 2022-07-08 DIAGNOSIS — I442 Atrioventricular block, complete: Secondary | ICD-10-CM

## 2022-07-08 DIAGNOSIS — G720 Drug-induced myopathy: Secondary | ICD-10-CM

## 2022-07-08 DIAGNOSIS — T466X5A Adverse effect of antihyperlipidemic and antiarteriosclerotic drugs, initial encounter: Secondary | ICD-10-CM

## 2022-07-08 DIAGNOSIS — I5042 Chronic combined systolic (congestive) and diastolic (congestive) heart failure: Secondary | ICD-10-CM

## 2022-07-08 DIAGNOSIS — Z950 Presence of cardiac pacemaker: Secondary | ICD-10-CM | POA: Diagnosis not present

## 2022-07-08 DIAGNOSIS — I480 Paroxysmal atrial fibrillation: Secondary | ICD-10-CM | POA: Diagnosis not present

## 2022-07-08 DIAGNOSIS — I10 Essential (primary) hypertension: Secondary | ICD-10-CM
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
## 2022-07-08 NOTE — Patient Instructions (Signed)
Medication Instructions:  No changes *If you need a refill on your cardiac medications before your next appointment, please call your pharmacy*  Follow-Up: At [facility], you and your health needs are our priority.  As part of our continuing mission to provide you with exceptional heart care, we have created designated Provider Care Teams.  These Care Teams include your primary Cardiologist (physician) and Advanced Practice Providers (APPs -  Physician Assistants and Nurse Practitioners) who all work together to provide you with the care you need, when you need it.  We recommend signing up for the patient portal called "MyChart".  Sign up information is provided on this After Visit Summary.  MyChart is used to connect with patients for Virtual Visits (Telemedicine).  Patients are able to view lab/test results, encounter notes, upcoming appointments, etc.  Non-urgent messages can be sent to your provider as well.   To learn more about what you can do with MyChart, go to [email or web address].    Your next appointment:   1 year(s)  Provider:   Mihai Croitoru, MD     

## 2022-07-18 ENCOUNTER — Ambulatory Visit (INDEPENDENT_AMBULATORY_CARE_PROVIDER_SITE_OTHER): Payer: Medicare HMO

## 2022-07-18 DIAGNOSIS — I442 Atrioventricular block, complete: Secondary | ICD-10-CM

## 2022-07-18 LAB — CUP PACEART REMOTE DEVICE CHECK
Battery Remaining Longevity: 56 mo
Battery Remaining Percentage: 67 %
Battery Voltage: 2.98 V
Brady Statistic AP VP Percent: 1 %
Brady Statistic AP VS Percent: 1 %
Brady Statistic AS VP Percent: 99 %
Brady Statistic AS VS Percent: 1 %
Brady Statistic RA Percent Paced: 1 %
Date Time Interrogation Session: 20240509040020
Implantable Lead Connection Status: 753985
Implantable Lead Connection Status: 753985
Implantable Lead Connection Status: 753985
Implantable Lead Implant Date: 20140721
Implantable Lead Implant Date: 20141117
Implantable Lead Implant Date: 20141117
Implantable Lead Location: 753858
Implantable Lead Location: 753859
Implantable Lead Location: 753860
Implantable Lead Model: 5071
Implantable Pulse Generator Implant Date: 20220207
Lead Channel Impedance Value: 390 Ohm
Lead Channel Impedance Value: 400 Ohm
Lead Channel Impedance Value: 590 Ohm
Lead Channel Pacing Threshold Amplitude: 0.5 V
Lead Channel Pacing Threshold Amplitude: 0.75 V
Lead Channel Pacing Threshold Amplitude: 1.25 V
Lead Channel Pacing Threshold Pulse Width: 0.4 ms
Lead Channel Pacing Threshold Pulse Width: 0.4 ms
Lead Channel Pacing Threshold Pulse Width: 0.5 ms
Lead Channel Sensing Intrinsic Amplitude: 0.8 mV
Lead Channel Sensing Intrinsic Amplitude: 11.3 mV
Lead Channel Setting Pacing Amplitude: 2 V
Lead Channel Setting Pacing Amplitude: 2.5 V
Lead Channel Setting Pacing Amplitude: 2.5 V
Lead Channel Setting Pacing Pulse Width: 0.4 ms
Lead Channel Setting Pacing Pulse Width: 0.5 ms
Lead Channel Setting Sensing Sensitivity: 2 mV
Pulse Gen Model: 3222
Pulse Gen Serial Number: 3859407

## 2022-07-24 ENCOUNTER — Telehealth: Payer: Self-pay

## 2022-07-24 NOTE — Progress Notes (Unsigned)
Care Management & Coordination Services Pharmacy Team  Reason for Encounter: Appointment Reminder  Contacted patient to confirm in office appointment with Delano Metz, PharmD on 10/30/2022 at 9:00. {US [facility] Outreach:[id number]}  Have you seen any other providers since your last visit? **{YES NO:22349}  Any changes in your medications or health? {YES NO:22349}  Any side effects from any medications? {YES NO:22349}  Do you have an symptoms or problems not managed by your medications? {YES NO:22349}  Any concerns about your health right now? {YES NO:22349}  Has your provider asked that you check blood pressure, blood sugar, or follow special diet at home? {YES NO:22349}  Do you get any type of exercise on a regular basis? {YES NO:22349}  Can you think of a goal you would like to reach for your health? ***  Do you have any problems getting your medications? {YES NO:22349}  Is there anything that you would like to discuss during the appointment? ***  Please bring medications and supplements to appointment    Chart review:  Recent office visits:  05/13/2022 Philip Aspen, Limmie Patricia, MD - Patient was seen for Allergic conjunctivitis of both eyes. No medication changes.   Recent consult visits:  07/08/2022 Thurmon Fair MD (cardiology) - Patient was seen for CHB and additional concerns. No medication changes.   06/17/2022 Shari Prows (ophthalmology) - Patient was seen for  Hypertensive retinopathy, bilateral and additional concerns. No additional chart notes.   01/25/2022 Amy Marriott PT - Patient was seen for muscle weakness and additional concerns. No medication changes.   Hospital visits:  None  Care Gaps: AWV - never done Last eye exam - 06/17/2022 Foot exam - 04/10/2015 Last BP - 112/64 on 07/08/2022 Last A1C - 6.4 on 01/17/2023 Covid  - never done Hep C Screen - never done Shingrix - never done Dexa scan - never done Urine ACR - overdue  Star Rating Drugs:   Trulicity1.5 mg - last filled 05/30/2022 84 DS at [facility] Jardiance 25 mg - last filled 06/27/2022 90 DS at [facility] Glipizide 5 mg - last filled 06/27/2022 90 DS at [facility] Losartan 25 mg - last filled 06/28/2022 90 DS at [facility] Metformin 1000 mg - last filled 06/27/2022 90 DS at [facility]  Clinical Pharmacist Assistant [phone]

## 2022-07-29 NOTE — Progress Notes (Unsigned)
Care Management & Coordination Services Pharmacy Note  07/30/2022 Name:  [name] MRN:  [id number] DOB:  May 27, 1950  Summary: BP at goal <130/80 A1C at goal <7  -  due for update LDL at goal <70  - due for update Never had a DEXA Scan  Recommendations/Changes made from today's visit: -Counseled on importance of routine BP monitoring and on symptoms of low/high BP -Counseled on importance of a low-carb diet and daily foot care, routine BG checks -Counseled to schedule AWV with PCP where updated bloodwork can be ordered/drawn -ORDER Dexa Scan with PCP approval  Follow up plan: AWV scheduled with PCP DM call in Sept Pharmacist visit in Sept   Subjective: [name] is an 72 y.o. year old female who is a primary patient of Philip Aspen, Limmie Patricia, MD.  The care coordination team was consulted for assistance with disease management and care coordination needs.    Engaged with patient face to face for initial visit. 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 Sometimes watches her great grandkids on the weekends and her weekly treat is to eat out for breakfast on Sat and Sun. Lived in [place] for years before moving up here but still very integrated with friends and coworkers.  Recent office visits: 05/13/2022 Philip Aspen, Limmie Patricia, MD - Patient was seen for Allergic conjunctivitis of both eyes. No medication changes.   Recent consult visits: 07/08/2022 Thurmon Fair MD (cardiology) - Patient was seen for CHB and additional concerns. No medication changes.    06/17/2022 Shari Prows (ophthalmology) - Patient was seen for  Hypertensive retinopathy, bilateral and additional concerns. No additional chart notes.    01/25/2022 Amy Marriott PT - Patient was seen for muscle weakness and additional concerns. No medication changes.    Hospital visits: None in previous 6 months   Objective:  Lab Results  Component Value Date    CREATININE 0.73 10/12/2021   BUN 18 10/12/2021   GFR 95.30 07/03/2021   GFRNONAA >60 10/12/2021   GFRAA >60 09/17/2019   NA 138 10/12/2021   K 3.6 10/12/2021   CALCIUM 9.6 10/12/2021   CO2 25 10/12/2021   GLUCOSE 144 (H) 10/12/2021    Lab Results  Component Value Date/Time   HGBA1C 6.4 (A) 01/16/2022 07:40 AM   HGBA1C 6.4 (A) 10/02/2021 08:12 AM   HGBA1C 7.0 06/29/2020 12:00 AM   HGBA1C 7.2 (H) 04/19/2019 08:54 AM   HGBA1C 7.3 (H) 12/08/2018 09:58 AM   GFR 95.30 07/03/2021 08:31 AM   GFR 64.24 07/01/2017 02:27 PM   MICROALBUR 0.8 07/03/2021 08:31 AM   MICROALBUR <0.7 07/01/2017 02:27 PM    Last diabetic Eye exam:  Lab Results  Component Value Date/Time   HMDIABEYEEXA Retinopathy (A) 06/17/2022 12:58 PM    Last diabetic Foot exam:  Lab Results  Component Value Date/Time   HMDIABFOOTEX normal 11/04/2012 12:00 AM     Lab Results  Component Value Date   CHOL 135 07/31/2020   HDL 61 07/31/2020   LDLCALC 48 07/31/2020   LDLDIRECT 50 04/04/2016   TRIG 157 (H) 07/31/2020   CHOLHDL 2.2 07/31/2020       Latest Ref Rng & Units 07/03/2021    8:31 AM 11/01/2020    2:03 AM 04/10/2020   12:23 PM  Hepatic Function  Total Protein 6.0 - 8.3 g/dL 7.7  6.9  7.2   Albumin 3.5 - 5.2 g/dL 4.2  3.8  4.1   AST 0 - 37  U/L 15  16  23    ALT 0 - 35 U/L 16  15  27    Alk Phosphatase 39 - 117 U/L 47  43  46   Total Bilirubin 0.2 - 1.2 mg/dL 0.6  0.5  0.8     Lab Results  Component Value Date/Time   TSH 1.46 07/03/2021 08:31 AM   TSH 1.560 12/08/2018 09:58 AM       Latest Ref Rng & Units 10/12/2021    6:08 PM 07/03/2021    8:31 AM 11/01/2020    2:03 AM  CBC  WBC 4.0 - 10.5 K/uL 5.6  8.8  6.6   Hemoglobin 12.0 - 15.0 g/dL 16.1  09.6  04.5   Hematocrit 36.0 - 46.0 % 37.5  38.4  39.1   Platelets 150 - 400 K/uL 231  234.0  217     Lab Results  Component Value Date/Time   VD25OH 40.33 10/02/2021 08:31 AM   VD25OH 18.13 (L) 07/03/2021 08:31 AM   VITAMINB12 337 07/03/2021 08:31 AM    VITAMINB12 328 08/11/2015 04:24 PM    Clinical ASCVD: Yes  The ASCVD Risk score (Arnett DK, et al., 2019) failed to calculate for the following reasons:   The patient has a prior MI or stroke diagnosis       03/14/2022    2:36 PM 01/16/2022    7:46 AM 11/29/2021    7:05 AM  Depression screen PHQ 2/9  Decreased Interest 0 0 0  Down, Depressed, Hopeless 0 1 1  PHQ - 2 Score 0 1 1  Altered sleeping  1 1  Tired, decreased energy  1 1  Change in appetite  0 0  Feeling bad or failure about yourself   0 0  Trouble concentrating  1 0  Moving slowly or fidgety/restless  0 1  Suicidal thoughts  0 0  PHQ-9 Score  4 4  Difficult doing work/chores   Somewhat difficult     Social History   Tobacco Use  Smoking Status Former   Packs/day: 1.00   Years: 10.00   Additional pack years: 0.00   Total pack years: 10.00   Types: Cigarettes   Quit date: 03/11/2012   Years since quitting: 10.3  Smokeless Tobacco Never   BP Readings from Last 3 Encounters:  07/08/22 112/64  05/13/22 110/80  01/17/22 121/72   Pulse Readings from Last 3 Encounters:  07/08/22 88  05/13/22 100  01/17/22 86   Wt Readings from Last 3 Encounters:  07/08/22 153 lb 9.6 oz (69.7 kg)  05/13/22 148 lb 3.2 oz (67.2 kg)  01/17/22 152 lb (68.9 kg)   BMI Readings from Last 3 Encounters:  07/08/22 26.37 kg/m  05/13/22 25.44 kg/m  01/17/22 26.09 kg/m    Allergies  Allergen Reactions   Statins Itching    Myalgias with rosuvastatin, atorvastatin and simvastatin     Medications Reviewed Today     Reviewed by Sherrill Raring, RPH (Pharmacist) on 07/30/22 at 0927  Med List Status: <None>   Medication Order Taking? Sig Documenting Provider Last Dose Status Informant  APPLE CIDER VINEGAR PO [id number] Yes Take 5,220 mg by mouth in the morning, at noon, and at bedtime. 1740 mg each [provider] Taking Active Self  Blood Glucose Monitoring Suppl (TRUE METRIX AIR GLUCOSE METER) DEVI [id number]  Check  blood sugar 2 times a day Carlus Pavlov, MD  Active Self  carvedilol (COREG) 12.5 MG tablet [id number] Yes Take 2 tablets by Mouth  Twice a day. Croitoru, Mihai, MD Taking Active   Cholecalciferol (VITAMIN D) 50 MCG (2000 UT) CAPS [id number] Yes Take 1 capsule (2,000 Units total) by mouth daily. Philip Aspen, Limmie Patricia, MD Taking Active   Dulaglutide (TRULICITY) 1.5 MG/0.5ML Namon Cirri [id number] Yes 1.5 mg by Other route every 7 (seven) days. Philip Aspen, Limmie Patricia, MD Taking Active   empagliflozin (JARDIANCE) 25 MG TABS tablet [id number] Yes TAKE 1 TABLET EVERY DAY BEFORE BREAKFAST Philip Aspen, Limmie Patricia, MD Taking Active   furosemide (LASIX) 20 MG tablet [id number] Yes TAKE 1 TABLET EVERY DAY Croitoru, Mihai, MD Taking Active   glipiZIDE (GLUCOTROL) 5 MG tablet [id number] Yes Take 1 tablet (5 mg total) by mouth 2 (two) times daily before a meal. Philip Aspen, Limmie Patricia, MD Taking Active   glucose blood (TRUE METRIX BLOOD GLUCOSE TEST) test strip [id number]  Use to check blood sugar 2 times a day. Carlus Pavlov, MD  Active Self  isosorbide mononitrate (IMDUR) 60 MG 24 hr tablet [id number] Yes TAKE 1 TABLET EVERY DAY Croitoru, Mihai, MD Taking Active   losartan (COZAAR) 25 MG tablet [id number] Yes TAKE 1 TABLET TWICE DAILY Croitoru, Mihai, MD Taking Active   metFORMIN (GLUCOPHAGE) 1000 MG tablet [id number] Yes TAKE 1 TABLET TWICE DAILY WITH FOOD Philip Aspen, Limmie Patricia, MD Taking Active   nitroGLYCERIN (NITROSTAT) 0.4 MG SL tablet [id number]  Place 1 tablet (0.4 mg total) under the tongue every 5 (five) minutes as needed for chest pain. Croitoru, Rachelle Hora, MD  Active Self  REPATHA SURECLICK 140 MG/ML SOAJ [id number] Yes INJECT 140 MG INTO THE SKIN EVERY 14 (FOURTEEN) DAYS. Croitoru, Mihai, MD Taking Active   TRUEplus Lancets 33G MISC [id number]  Use to check blood sugar 2 times a day. Carlus Pavlov, MD  Active Self  XARELTO 20 MG TABS tablet [id number] Yes TAKE 1 TABLET EVERY DAY WITH SUPPER  Croitoru, Mihai, MD Taking Active             SDOH:  (Social Determinants of Health) assessments and interventions performed: Yes SDOH Interventions    Flowsheet Row Care Coordination from 07/30/2022 in [facility] Telephone from 03/14/2022 in [facility] Care Coordination Chronic Care Management from 10/24/2020 in [facility] [place]  SDOH Interventions     Food Insecurity Interventions Intervention Not Indicated Intervention Not Indicated Intervention Not Indicated  Housing Interventions Intervention Not Indicated Intervention Not Indicated Intervention Not Indicated  Transportation Interventions -- Intervention Not Indicated Intervention Not Indicated  Utilities Interventions -- Intervention Not Indicated --  Financial Strain Interventions -- -- Intervention Not Indicated  Physical Activity Interventions -- -- Other (Comments)  [reviewed insurance fitness benefit]  Stress Interventions -- -- Intervention Not Indicated       Medication Assistance: None required.  Patient affirms current coverage meets needs.  Medication Access: Name and location of current pharmacy:  [facility] - [place], [place] - [address] [place] [address] Phone: [phone] Fax: [phone]  [id number] [place], [place] - [address] [place] [address] Phone: [phone] Fax: [phone]  [facility] (Now [facility]) - [address] [place], [place] - [address] [place] [address] Phone: [phone] Fax: [phone]  Within the past 30 days, how often has patient missed a dose of medication? None Is a pillbox or other method used to improve adherence? Yes  Factors that may  affect medication adherence? no barriers identified Are meds synced by current pharmacy? No  Are meds delivered by  current pharmacy? Yes  Does patient experience delays in picking up medications due to transportation concerns? No   Compliance/Adherence/Medication fill history: Care Gaps: AWV - never done Last eye exam - 06/17/2022 Foot exam - 04/10/2015 Last BP - 112/64 on 07/08/2022 Last A1C - 6.4 on 01/17/2023 Covid  - never done Hep C Screen - never done Shingrix - never done Dexa scan - never done Urine ACR - overdue  Star-Rating Drugs: Trulicity1.5 mg - last filled 05/30/2022 84 DS at [facility] Jardiance 25 mg - last filled 06/27/2022 90 DS at [facility] Glipizide 5 mg - last filled 06/27/2022 90 DS at [facility] Losartan 25 mg - last filled 06/28/2022 90 DS at [facility] Metformin 1000 mg - last filled 06/27/2022 90 DS at [facility]   Assessment/Plan Hypertension (BP goal <130/80) -Controlled -Current treatment: See HF  Hyperlipidemia: (LDL goal < 70) -Controlled -Current treatment: Repatha once every 2 weeks (Sundays) Appropriate, Effective, Safe, Accessible -Medications previously tried: Atorvastatin, Simvastatin, rosuvastatin  -Current dietary patterns: see DM section -Current exercise habits: tries to stay on the move but is limited due to very achy joints -Educated on Cholesterol goals;  Importance of limiting foods high in cholesterol; Exercise goal of 150 minutes per week; -Recommended to continue current medication  Diabetes (A1c goal <7%) -Controlled -Current medications: Trulicity 1.5mg  once weekly on Sundays Appropriate, Effective, Safe, Accessible Jardiance 25mg  1 qd Appropriate, Effective, Safe, Accessible Glipizide 5mg  1 BID Appropriate, Effective, Safe, Accessible Metformin 1000mg  1 BID Appropriate, Effective, Safe, Accessible -Medications previously tried: Invokana, Amaryl, Novolog, Victoza -Current home glucose readings Does not check on a daily basis, admits it has been 2-3 weeks since she last checked.  Denies any lows or sugars above 200 after meals -Denies  hypoglycemic/hyperglycemic symptoms -Current meal patterns:  breakfast: bowl of cereal and cup of coffee  lunch: whatever is leftover from dinner  dinner: has really been focusing on cesear salads and elminating potatoes recently snacks: very rarely, sometimes a slice of cake if out drinks: water -Current exercise: see above -Educated on A1c and blood sugar goals; Complications of diabetes including kidney damage, retinal damage, and cardiovascular disease; Exercise goal of 150 minutes per week; Benefits of routine self-monitoring of blood sugar; Counseled to check feet daily and get yearly eye exams -Counseled to check feet daily and get yearly eye exams -Recommended to continue current medication Recommended updated A1C at next PCP visit (sch for Aug)  Atrial Fibrillation (Goal: prevent stroke and major bleeding) -Not assessed today -CHA2DS2VASC: 6 -Current treatment: Xarelto 20mg  1 qd Appropriate, Effective, Safe, Accessible Carvedilol 12.5mg  2 tabs BID Appropriate, Effective, Safe, Accessible -Medications previously tried: Amiodarone, Atenolol, Metoprolol -Home BP and HR readings: not checking at home but does have a machine  -Counseled on increased risk of stroke due to Afib and benefits of anticoagulation for stroke prevention; importance of adherence to anticoagulant exactly as prescribed; bleeding risk associated with Xarelto and importance of self-monitoring for signs/symptoms of bleeding; avoidance of NSAIDs due to increased bleeding risk with anticoagulants; importance of regular laboratory monitoring; seeking medical attention after a head injury or if there is blood in the urine/stool; -Recommended to continue current medication  Heart Failure (Goal: manage symptoms and prevent exacerbations) -Controlled -Last ejection fraction: 55-60% (Date: 07/12/21) -HF type: HFimpEF (EF improved from <40% to > 40%) -NYHA Class: III (marked limitation of activity) -AHA HF Stage: C  (Heart disease and  symptoms present) -Current treatment: Carvedilol 12.5mg  2 tabs BID Appropriate, Effective, Safe, Accessible Jardiance 25mg  1 qd Appropriate, Effective, Safe, Accessible Lasix 20mg  1 qd Appropriate, Effective, Safe, Accessible Losartan 25mg  1 BID Appropriate, Effective, Safe, Accessible -Medications previously tried: Atenolol,Metoprolol  -Current home BP/HR readings: see above -Current home daily weights: Not discussed -Current dietary habits: mindful of salt intake -Current exercise habits: see above -Educated on Benefits of medications for managing symptoms and prolonging life Importance of blood pressure control -Recommended to continue current medication  CAD (Goal: Slow progression of atherosclerosis (plaques / blockages) throughout your body to reduce risk of heart attack and strokes) -Not assessed today Current Medication Therapy: Isosorbide Mono 60mg  1 qd Appropriate, Effective, Safe, Accessible Nitroglycerin 0.4mg  prn Appropriate, Effective, Safe, Accessible  OTC  -Current treatment  Vit D 2000 units 1 qd Appropriate, Effective, Safe, Accessible Apple Cider Vinegar tabs 3 daily Appropriate, Effective, Safe, Accessible  Sherrill Raring Clinical Pharmacist [phone]

## 2022-07-30 ENCOUNTER — Ambulatory Visit: Payer: Medicare HMO

## 2022-07-30 ENCOUNTER — Telehealth: Payer: Self-pay | Admitting: *Deleted

## 2022-07-30 DIAGNOSIS — Z13820 Encounter for screening for osteoporosis: Secondary | ICD-10-CM

## 2022-07-30 NOTE — Telephone Encounter (Signed)
Order placed

## 2022-07-30 NOTE — Telephone Encounter (Signed)
Okay to order?

## 2022-07-30 NOTE — Telephone Encounter (Signed)
-----   Message from Sherrill Raring, [facility] sent at 07/30/2022  9:39 AM EDT ----- Regarding: DEXA Scan Hello,  Had initial visit with patient today and she is requesting an order for a DEXA Scan, if one could be placed please.  Thank you, Sherrill Raring Clinical Pharmacist [phone]

## 2022-08-03 ENCOUNTER — Encounter: Payer: Self-pay | Admitting: Internal Medicine

## 2022-08-06 NOTE — Progress Notes (Signed)
Remote pacemaker transmission.   

## 2022-08-21 ENCOUNTER — Other Ambulatory Visit: Payer: Self-pay | Admitting: Internal Medicine

## 2022-08-21 DIAGNOSIS — E1159 Type 2 diabetes mellitus with other circulatory complications: Secondary | ICD-10-CM

## 2022-09-02 ENCOUNTER — Ambulatory Visit (INDEPENDENT_AMBULATORY_CARE_PROVIDER_SITE_OTHER): Payer: Medicare HMO | Admitting: Internal Medicine

## 2022-09-02 ENCOUNTER — Encounter: Payer: Self-pay | Admitting: Internal Medicine

## 2022-09-02 VITALS — BP 120/80 | HR 79 | Temp 98.3°F | Wt 153.1 lb

## 2022-09-02 DIAGNOSIS — E1159 Type 2 diabetes mellitus with other circulatory complications: Secondary | ICD-10-CM | POA: Diagnosis not present

## 2022-09-02 DIAGNOSIS — M2550 Pain in unspecified joint: Secondary | ICD-10-CM | POA: Diagnosis not present

## 2022-09-02 DIAGNOSIS — Z7984 Long term (current) use of oral hypoglycemic drugs: Secondary | ICD-10-CM

## 2022-09-02 DIAGNOSIS — Z7985 Long-term (current) use of injectable non-insulin antidiabetic drugs: Secondary | ICD-10-CM

## 2022-09-02 LAB — COMPREHENSIVE METABOLIC PANEL
ALT: 16 U/L (ref 0–35)
AST: 14 U/L (ref 0–37)
Albumin: 4.2 g/dL (ref 3.5–5.2)
Alkaline Phosphatase: 42 U/L (ref 39–117)
BUN: 12 mg/dL (ref 6–23)
CO2: 27 mEq/L (ref 19–32)
Calcium: 10 mg/dL (ref 8.4–10.5)
Chloride: 99 mEq/L (ref 96–112)
Creatinine, Ser: 0.76 mg/dL (ref 0.40–1.20)
GFR: 79.35 mL/min (ref >60.00)
Glucose, Bld: 242 mg/dL — ABNORMAL HIGH (ref 70–99)
Potassium: 4.3 mEq/L (ref 3.5–5.1)
Sodium: 138 mEq/L (ref 135–145)
Total Bilirubin: 0.7 mg/dL (ref 0.2–1.2)
Total Protein: 7.5 g/dL (ref 6.0–8.3)

## 2022-09-02 LAB — MICROALBUMIN / CREATININE URINE RATIO
Creatinine,U: 7.7 mg/dL
Microalb Creat Ratio: 9.1 mg/g (ref 0.0–30.0)
Microalb, Ur: <0.7 mg/dL (ref 0.0–1.9)

## 2022-09-02 LAB — VITAMIN D 25 HYDROXY (VIT D DEFICIENCY, FRACTURES): VITD: 38.48 ng/mL (ref 30.00–100.00)

## 2022-09-02 LAB — POCT GLYCOSYLATED HEMOGLOBIN (HGB A1C): Hemoglobin A1C: 7.1 % — AB (ref 4.0–5.6)

## 2022-09-02 LAB — TSH: TSH: 1.61 u[IU]/mL (ref 0.35–5.50)

## 2022-09-02 LAB — VITAMIN B12: Vitamin B-12: 188 pg/mL — ABNORMAL LOW (ref 211–911)
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
## 2022-09-04 ENCOUNTER — Other Ambulatory Visit: Payer: Self-pay | Admitting: Internal Medicine

## 2022-09-05 ENCOUNTER — Other Ambulatory Visit: Payer: Self-pay | Admitting: Cardiovascular Disease

## 2022-09-09 ENCOUNTER — Other Ambulatory Visit: Payer: Self-pay | Admitting: Internal Medicine

## 2022-09-09 ENCOUNTER — Encounter: Payer: Self-pay | Admitting: Internal Medicine

## 2022-09-09 DIAGNOSIS — E559 Vitamin D deficiency, unspecified: Secondary | ICD-10-CM

## 2022-09-17 ENCOUNTER — Encounter: Payer: Self-pay | Admitting: Internal Medicine

## 2022-09-17 DIAGNOSIS — E538 Deficiency of other specified B group vitamins: Secondary | ICD-10-CM | POA: Insufficient documentation

## 2022-09-19 ENCOUNTER — Ambulatory Visit: Payer: Medicare HMO | Admitting: Internal Medicine

## 2022-09-30 NOTE — Telephone Encounter (Signed)
Pt will be here for her B-12 shot on Tuesday, 10/01/22

## 2022-10-01 ENCOUNTER — Ambulatory Visit (INDEPENDENT_AMBULATORY_CARE_PROVIDER_SITE_OTHER): Payer: Medicare HMO | Admitting: *Deleted

## 2022-10-01 DIAGNOSIS — E538 Deficiency of other specified B group vitamins: Secondary | ICD-10-CM | POA: Diagnosis not present

## 2022-10-01 MED ORDER — CYANOCOBALAMIN 1000 MCG/ML IJ SOLN
1000.0000 ug | Freq: Once | INTRAMUSCULAR | Status: AC
Start: 2022-10-01 — End: 2022-10-01
  Administered 2022-10-01: 1000 ug via INTRAMUSCULAR

## 2022-10-01 NOTE — Progress Notes (Signed)
Per orders of Dr. Burchette, injection of B12 given by Rachel Vereen. Patient tolerated injection well. 

## 2022-10-02 ENCOUNTER — Telehealth: Payer: Self-pay | Admitting: Internal Medicine

## 2022-10-02 NOTE — Telephone Encounter (Signed)
[facility] [name] [phone]  Pt was scheduled for a Bone Density at their location in December 2024  Pt was seen by MD on 09/02/22 by CC  CC accidentally cancelled Pt's Bone Density, when checking Pt in.  Pt was offered another Bone Density for January 2025.  Pt is very, very upset, stating she has been waiting for many months for her BD appt.  [facility] is asking if MD can please put in another referral to another location that can see Pt sooner?  Pt was offered an appointment for today at [facility], but declined stating she was too far away.  Please advise.

## 2022-10-02 NOTE — Telephone Encounter (Signed)
Spoke to patient and order was faxed and confirmed to [facility].

## 2022-10-03 ENCOUNTER — Encounter: Payer: Self-pay | Admitting: Cardiovascular Disease

## 2022-10-03 ENCOUNTER — Telehealth: Payer: Self-pay | Admitting: Cardiovascular Disease

## 2022-10-03 NOTE — Telephone Encounter (Signed)
Excellent.  Thank you 

## 2022-10-03 NOTE — Telephone Encounter (Signed)
Pt c/o Shortness Of Breath: STAT if SOB developed within the last 24 hours or pt is noticeably SOB on the phone  1. Are you currently SOB (can you hear that pt is SOB on the phone)? No  2. How long have you been experiencing SOB? Last night for the first time in a while for about 40 mins  3. Are you SOB when sitting or when up moving around? Pt was laying down when it happened  4. Are you currently experiencing any other symptoms? No

## 2022-10-03 NOTE — Telephone Encounter (Signed)
Updated CorVue 10/03/2022.

## 2022-10-03 NOTE — Telephone Encounter (Signed)
Please have her do an extra device download for thoracic impedance/corvue.  I am not sure she needs to be in the long-term heart failure device clinic, but would like to see if we need to change her diuretic prescription for her current complaints.

## 2022-10-03 NOTE — Telephone Encounter (Addendum)
Patient stated she experienced SOB last night while in bed. She also states she has been noticing a little more swelling in her feet and ankles when she wakes up in the morning and she sometimes has weight gain. She takes her lasix and the fluid does comes off.  Currently asymptomatic.   She will continue to monitor her weight daily. She will give [place] a call if she gain 3 lb in a day or 5  lb in a week. She woke up and did not have any swelling or weight gain  and feels fine. Discussed ED precautions will forward to provider.

## 2022-10-08 ENCOUNTER — Ambulatory Visit: Payer: Medicare HMO | Admitting: Internal Medicine

## 2022-10-08 VITALS — BP 120/80 | HR 86 | Temp 98.3°F | Ht 64.0 in | Wt 152.6 lb

## 2022-10-08 DIAGNOSIS — E1159 Type 2 diabetes mellitus with other circulatory complications: Secondary | ICD-10-CM | POA: Diagnosis not present

## 2022-10-08 DIAGNOSIS — Z1231 Encounter for screening mammogram for malignant neoplasm of breast: Secondary | ICD-10-CM

## 2022-10-08 DIAGNOSIS — Z0000 Encounter for general adult medical examination without abnormal findings: Secondary | ICD-10-CM

## 2022-10-08 DIAGNOSIS — E538 Deficiency of other specified B group vitamins: Secondary | ICD-10-CM | POA: Diagnosis not present

## 2022-10-08 DIAGNOSIS — I25810 Atherosclerosis of coronary artery bypass graft(s) without angina pectoris: Secondary | ICD-10-CM

## 2022-10-08 DIAGNOSIS — E782 Mixed hyperlipidemia: Secondary | ICD-10-CM

## 2022-10-08 DIAGNOSIS — I10 Essential (primary) hypertension: Secondary | ICD-10-CM

## 2022-10-08 MED ORDER — CYANOCOBALAMIN 1000 MCG/ML IJ SOLN
1000.0000 ug | Freq: Once | INTRAMUSCULAR | Status: AC
Start: 2022-10-08 — End: 2022-10-08
  Administered 2022-10-08: 1000 ug via INTRAMUSCULAR

## 2022-10-08 NOTE — Progress Notes (Signed)
Medicare Wellness question were answered 10/07/22.

## 2022-10-08 NOTE — Progress Notes (Signed)
Established Patient Office Visit     CC/Reason for Visit: Annual preventive exam and subsequent Medicare wellness visit  HPI: [name] is a 72 y.o. female who is coming in today for the above mentioned reasons. Past Medical History is significant for:  coronary artery disease, A. fib, diabetes that has been well controlled, vit D deficiency and hyperlipidemia.  She has been feeling well other than continued muscle aches that we believe might be related to Repatha.  She understands that this is an essential medication for her.  She has routine eye care and dental care.  She is overdue for shingles and COVID vaccines.  She is overdue for DEXA and mammogram.   Past Medical/Surgical History: Past Medical History:  Diagnosis Date   Acute on chronic combined systolic and diastolic CHF, NYHA class 3 (HCC) 03/19/2013   AICD (automatic cardioverter/defibrillator) present 01/2013   Biventricular cardiac pacemaker in situ    Allergic rhinitis    Anogenital warts 01/22/2011   Anxiety state, unspecified 05/13/2013   BREAST CANCER, HX OF 09/19/2009   Qualifier: Diagnosis of  By: Tawanna Cooler MD, Tinnie Gens A    CAD (coronary artery disease)    a. 2004: s/p MI in [place]. No PCI->Medical RX;  b. 07/2012 Cath: LM 30-40, LAD 70p, 70/75m, D1 80-90p, OM1 small 90p, OM2 large 80-90p, 75m, 70-80d, RCA 20-30 diff, EF 40%, glob HK.s/p CABG   Cancer of left breast (HCC) 2002   Patient reports left breast cancer diagnosis in 2002 treated with bilateral mastectomy positive lymph nodes with left axillary dissection followed by chemotherapy of unknown type   Cataract    Chronic combined systolic and diastolic CHF, NYHA class 2 (HCC) 01/08/2013   Diabetes mellitus type II    Exertional dyspnea 08/05/2014   Exertional shortness of breath    Generalized osteoarthrosis, involving multiple sites 08/25/2015   History of colon polyps    Hyperlipidemia    Hypertension    Incisional hernia, without obstruction or  gangrene 05/04/2015   Ischemic cardiomyopathy    a. 07/2012 Echo: EF 35%, Sev inferoseptal HK, mildly dil LA, Peak PASP .   LBBB (left bundle branch block)    a. intermittent - present during rapid afib 07/2012.   MYOCARDIAL INFARCTION, HX OF 09/19/2009   Qualifier: Diagnosis of  By: Tawanna Cooler MD, Eugenio Hoes    Neuromuscular disorder [facility])    Patient reports chronic numbness in the right foot related to previous surgery on the right leg and "nerve damage"   PAF (paroxysmal atrial fibrillation) (HCC)    a. 07/2012: Amio and xarelto initiated.   Right carotid bruit 08/11/2012   SBO (small bowel obstruction) (HCC)    SOB (shortness of breath) 02/26/2018   Type 2 diabetes mellitus with circulatory disorder, without long-term current use of insulin (HCC) 10/03/2015   Vitamin D deficiency 02/22/2016    Past Surgical History:  Procedure Laterality Date   ABDOMINAL HYSTERECTOMY  2000   APPENDECTOMY  1974   BI-VENTRICULAR PACEMAKER INSERTION N/A 01/25/2013   Procedure: BI-VENTRICULAR PACEMAKER INSERTION (CRT-P);  Surgeon: Marinus Maw, MD;  Location[facility] CATH LAB;  Service: Cardiovascular;  Laterality: N/A;   BREAST BIOPSY Left 2002   CARDIAC CATHETERIZATION     CHOLECYSTECTOMY OPEN  1974   CORONARY ARTERY BYPASS GRAFT N/A 09/28/2012   Procedure: CORONARY ARTERY BYPASS GRAFTING (CABG);  Surgeon: Delight Ovens, MD;  Location: [facility] OR;  Service: Open Heart Surgery;  Laterality: N/A;  CABG x four, using left  internal mammary artery and left leg greater saphenous vein harvested endoscopically   DILATION AND CURETTAGE OF UTERUS  1983   EPICARDIAL PACING LEAD PLACEMENT N/A 09/28/2012   Procedure: EPICARDIAL PACING LEAD PLACEMENT;  Surgeon: Delight Ovens, MD;  Location: [facility] OR;  Service: Thoracic;  Laterality: N/A;  LV LEAD PLACEMENT   HERNIA REPAIR     INCISIONAL HERNIA REPAIR N/A 05/25/2015   Procedure: LAPAROSCOPIC INCISIONAL HERNIA WITH MESH ;  Surgeon: Emelia Loron, MD;  Location: [facility] OR;   Service: General;  Laterality: N/A;   INCONTINENCE SURGERY  2000   INSERTION OF MESH N/A 05/25/2015   Procedure: INSERTION OF MESH;  Surgeon: Emelia Loron, MD;  Location: [facility] OR;  Service: General;  Laterality: N/A;   INTRAOPERATIVE TRANSESOPHAGEAL ECHOCARDIOGRAM N/A 09/28/2012   Procedure: INTRAOPERATIVE TRANSESOPHAGEAL ECHOCARDIOGRAM;  Surgeon: Delight Ovens, MD;  Location: [facility] OR;  Service: Open Heart Surgery;  Laterality: N/A;   LAPAROSCOPIC INCISIONAL / UMBILICAL / VENTRAL HERNIA REPAIR  05/25/2015   IHR   LEFT HEART CATHETERIZATION WITH CORONARY ANGIOGRAM N/A 07/20/2012   Procedure: LEFT HEART CATHETERIZATION WITH CORONARY ANGIOGRAM;  Surgeon: Peter M Swaziland, MD;  Location: [facility] CATH LAB;  Service: Cardiovascular;  Laterality: N/A;   MASTECTOMY Right 2002   MASTECTOMY MODIFIED RADICAL W/ AXILLARY LYMPH NODES W/ OR W/O PECTORALIS MINOR Left 2002   MAZE N/A 09/28/2012   Procedure: MAZE;  Surgeon: Delight Ovens, MD;  Location: [facility] OR;  Service: Open Heart Surgery;  Laterality: N/A;   ORIF WRIST FRACTURE Right 11/08/2017   Procedure: OPEN REDUCTION INTERNAL FIXATION (ORIF) WRIST FRACTURE;  Surgeon: Dominica Severin, MD;  Location: [facility] OR;  Service: Orthopedics;  Laterality: Right;  90 mins   PPM GENERATOR CHANGEOUT N/A 04/17/2020   Procedure: PPM GENERATOR CHANGEOUT;  Surgeon: Thurmon Fair, MD;  Location: [facility] INVASIVE CV LAB;  Service: Cardiovascular;  Laterality: N/A;   TENDON REPAIR Right 2001 X 3-4   torn ligaments and tendons in ankle up to knee from work related accident   TUBAL LIGATION  1987    Social History:  reports that she quit smoking about 10 years ago. Her smoking use included cigarettes. She started smoking about 20 years ago. She has a 10 pack-year smoking history. She has never used smokeless tobacco. She reports that she does not drink alcohol and does not use drugs.  Allergies: Allergies  Allergen Reactions   Statins Itching    Myalgias with rosuvastatin,  atorvastatin and simvastatin     Family History:  Family History  Problem Relation Age of Onset   Kidney failure Mother    Heart disease Mother        Died mid 39s complications of diabetes   Diabetes Mother    Colon cancer Sister    Arthritis Other    Diabetes Other    Hyperlipidemia Other    Ovarian cancer Other    Liver cancer Brother    Stomach cancer Neg Hx    Rectal cancer Neg Hx    Esophageal cancer Neg Hx    Pancreatic cancer Neg Hx      Current Outpatient Medications:    APPLE CIDER VINEGAR PO, Take 5,220 mg by mouth in the morning, at noon, and at bedtime. 1740 mg each, Disp: , Rfl:    Blood Glucose Monitoring Suppl (TRUE METRIX AIR GLUCOSE METER) DEVI, Check blood sugar 2 times a day, Disp: 1 each, Rfl: 0   carvedilol (COREG) 12.5 MG tablet, Take 2 tablets by Mouth Twice  a day., Disp: 360 tablet, Rfl: 3   Cholecalciferol (VITAMIN D3) 50 MCG (2000 UT) capsule, TAKE 1 CAPSULE (2000 UNITS TOTAL) BY MOUTH DAILY., Disp: 90 capsule, Rfl: 3   Dulaglutide (TRULICITY) 1.5 MG/0.5ML SOPN, INJECT 1.5MG  (1 PEN) UNDER THE SKIN EVERY 7 DAYS, Disp: 2.5 mL, Rfl: 3   empagliflozin (JARDIANCE) 25 MG TABS tablet, TAKE 1 TABLET EVERY DAY BEFORE BREAKFAST, Disp: 90 tablet, Rfl: 1   furosemide (LASIX) 20 MG tablet, TAKE 1 TABLET EVERY DAY, Disp: 90 tablet, Rfl: 3   glipiZIDE (GLUCOTROL) 5 MG tablet, Take 1 tablet (5 mg total) by mouth 2 (two) times daily before a meal., Disp: 180 tablet, Rfl: 1   glucose blood (TRUE METRIX BLOOD GLUCOSE TEST) test strip, Use to check blood sugar 2 times a day., Disp: 200 each, Rfl: 12   isosorbide mononitrate (IMDUR) 60 MG 24 hr tablet, TAKE 1 TABLET EVERY DAY, Disp: 90 tablet, Rfl: 3   losartan (COZAAR) 25 MG tablet, TAKE 1 TABLET TWICE DAILY, Disp: 180 tablet, Rfl: 3   metFORMIN (GLUCOPHAGE) 1000 MG tablet, TAKE 1 TABLET TWICE DAILY WITH FOOD, Disp: 180 tablet, Rfl: 1   nitroGLYCERIN (NITROSTAT) 0.4 MG SL tablet, Place 1 tablet (0.4 mg total) under the  tongue every 5 (five) minutes as needed for chest pain., Disp: 25 tablet, Rfl: 3   REPATHA SURECLICK 140 MG/ML SOAJ, INJECT 140 MG INTO THE SKIN EVERY 14 (FOURTEEN) DAYS., Disp: 6 mL, Rfl: 3   TRUEplus Lancets 33G MISC, Use to check blood sugar 2 times a day., Disp: 200 each, Rfl: 12   XARELTO 20 MG TABS tablet, TAKE 1 TABLET EVERY DAY WITH SUPPER, Disp: 90 tablet, Rfl: 3  Review of Systems:  Negative unless indicated in HPI.   Physical Exam: Vitals:   10/07/22 1636  BP: 120/80  Pulse: 86  Temp: 98.3 F (36.8 C)  TempSrc: Oral  SpO2: 98%  Weight: 152 lb 9.6 oz (69.2 kg)  Height: 5\' 4"  (1.626 m)    Body mass index is 26.19 kg/m.   Physical Exam Vitals reviewed.  Constitutional:      General: She is not in acute distress.    Appearance: Normal appearance. She is not ill-appearing, toxic-appearing or diaphoretic.  HENT:     Head: Normocephalic.     Right Ear: Tympanic membrane, ear canal and external ear normal. There is no impacted cerumen.     Left Ear: Tympanic membrane, ear canal and external ear normal. There is no impacted cerumen.     Nose: Nose normal.     Mouth/Throat:     Mouth: Mucous membranes are moist.     Pharynx: Oropharynx is clear. No oropharyngeal exudate or posterior oropharyngeal erythema.  Eyes:     General: No scleral icterus.       Right eye: No discharge.        Left eye: No discharge.     Conjunctiva/sclera: Conjunctivae normal.     Pupils: Pupils are equal, round, and reactive to light.  Neck:     Vascular: No carotid bruit.  Cardiovascular:     Rate and Rhythm: Normal rate and regular rhythm.     Pulses: Normal pulses.     Heart sounds: Normal heart sounds.  Pulmonary:     Effort: Pulmonary effort is normal. No respiratory distress.     Breath sounds: Normal breath sounds.  Abdominal:     General: Abdomen is flat. Bowel sounds are normal.     Palpations: Abdomen  is soft.  Musculoskeletal:        General: Normal range of motion.      Cervical back: Normal range of motion.  Skin:    General: Skin is warm and dry.  Neurological:     General: No focal deficit present.     Mental Status: She is alert and oriented to person, place, and time. Mental status is at baseline.  Psychiatric:        Mood and Affect: Mood normal.        Behavior: Behavior normal.        Thought Content: Thought content normal.        Judgment: Judgment normal.    Subsequent Medicare wellness visit   1. Risk factors, based on past  M,S,F - Cardiac Risk Factors include: advanced age (>21men, >83 women);diabetes mellitus;dyslipidemia;family history of premature cardiovascular disease   2.  Physical activities: Dietary issues and exercise activities discussed:      3.  Depression/mood:  Flowsheet Row Office Visit from 10/08/2022 in [facility] Total Score 11        4.  ADL's:    10/07/2022    4:34 PM 10/07/2022    9:22 AM  In your present state of health, do you have any difficulty performing the following activities:  Hearing? 0   Vision? 0 0  Difficulty concentrating or making decisions? 1 1  Comment memory   Walking or climbing stairs? 1 1  Dressing or bathing? 0 0  Doing errands, shopping? 0 0  Preparing Food and eating ? N N  Using the Toilet? N N  In the past six months, have you accidently leaked urine? Y Y  Do you have problems with loss of bowel control? Y Y  Managing your Medications? N N  Managing your Finances? N N  Housekeeping or managing your Housekeeping? N Y     5.  Fall risk:     05/13/2022   11:06 AM 09/01/2022    9:56 AM 09/02/2022    8:46 AM 10/07/2022    9:22 AM 10/07/2022    4:39 PM  Fall Risk  Falls in the past year? 0 1 1 1 1   Was there an injury with Fall? 0 1 0  1  Fall Risk Category Calculator 0 3 2  2   Fall risk Follow up Falls evaluation completed  Falls evaluation completed  Falls evaluation completed     6.  Home safety: No problems identified   7.   Height weight, and visual acuity: height and weight as above, vision/hearing: Vision Screening   Right eye Left eye Both eyes  Without correction 20/25 20/25 20/25   With correction        8.  Counseling: Counseling given: Not Answered    9. Lab orders based on risk factors: Laboratory update will be reviewed   10. Cognitive assessment:        10/07/2022    4:39 PM  6CIT Screen  What Year? 0 points  What month? 0 points  What time? 0 points  Count back from 20 0 points  Months in reverse 0 points  Repeat phrase 0 points  Total Score 0 points     11. Screening: Patient provided with a written and personalized 5-10 year screening schedule in the AVS. Health Maintenance  Topic Date Due   Hepatitis C Screening  Never done   Zoster (Shingles) Vaccine (1 of 2) Never done  Complete foot exam   04/09/2016   DEXA scan (bone density measurement)  Never done   COVID-19 Vaccine (1 - 2023-24 season) Never done   Flu Shot  10/10/2022   Hemoglobin A1C  03/04/2023   Eye exam for diabetics  06/17/2023   Yearly kidney function blood test for diabetes  09/02/2023   Yearly kidney health urinalysis for diabetes  09/02/2023   Medicare Annual Wellness Visit  10/08/2023   Colon Cancer Screening  07/30/2024   DTaP/Tdap/Td vaccine (3 - Td or Tdap) 08/16/2029   Pneumonia Vaccine  Completed   HPV Vaccine  Aged Out    12. Provider List Update: Patient Care Team    Relationship Specialty Notifications Start End  Philip Aspen, Limmie Patricia, MD PCP - General Internal Medicine  11/30/21   Thurmon Fair, MD PCP - Cardiology Cardiology  03/21/20   Thurmon Fair, MD Attending Physician Cardiology  09/22/12   Delight Ovens, MD (Inactive) Attending Physician Cardiothoracic Surgery  09/23/12   Sherrill Raring, [facility] (Inactive)  Pharmacist  07/30/22      13. Advance Directives: Does Patient Have a Medical Advance Directive?: No Would patient like information on creating a medical advance  directive?: No - Patient declined  14. Opioids: Patient is not on any opioid prescriptions and has no risk factors for a substance use disorder.   15.   Goals      travel and spend time with grandchildren         I have personally reviewed and noted the following in the patient's chart:   Medical and social history Use of alcohol, tobacco or illicit drugs  Current medications and supplements Functional ability and status Nutritional status Physical activity Advanced directives List of other physicians Hospitalizations, surgeries, and ER visits in previous 12 months Vitals Screenings to include cognitive, depression, and falls Referrals and appointments  In addition, I have reviewed and discussed with patient certain preventive protocols, quality metrics, and best practice recommendations. A written personalized care plan for preventive services as well as general preventive health recommendations were provided to patient.   Impression and Plan:  Medicare annual wellness visit, subsequent  Type 2 diabetes mellitus with other circulatory complication, without long-term current use of insulin (HCC) -     Referral to Nutrition and Diabetes Services  B12 deficiency -     Cyanocobalamin  Essential hypertension  Coronary artery disease involving coronary bypass graft of native heart without angina pectoris  Mixed hyperlipidemia  Screening mammogram for breast cancer -     Digital Screening Mammogram, Left and Right; Future   -Recommend routine eye and dental care. -Healthy lifestyle discussed in detail. -Labs to be updated today. -Prostate cancer screening: N/A Health Maintenance  Topic Date Due   Hepatitis C Screening  Never done   Zoster (Shingles) Vaccine (1 of 2) Never done   Complete foot exam   04/09/2016   DEXA scan (bone density measurement)  Never done   COVID-19 Vaccine (1 - 2023-24 season) Never done   Flu Shot  10/10/2022   Hemoglobin A1C   03/04/2023   Eye exam for diabetics  06/17/2023   Yearly kidney function blood test for diabetes  09/02/2023   Yearly kidney health urinalysis for diabetes  09/02/2023   Medicare Annual Wellness Visit  10/08/2023   Colon Cancer Screening  07/30/2024   DTaP/Tdap/Td vaccine (3 - Td or Tdap) 08/16/2029   Pneumonia Vaccine  Completed   HPV Vaccine  Aged Out    -  Declines shingles vaccine. -Mammogram requested.    Chaya Jan, MD [facility]

## 2022-10-09 ENCOUNTER — Encounter: Payer: Self-pay | Admitting: Internal Medicine

## 2022-10-10 ENCOUNTER — Encounter: Payer: Self-pay | Admitting: Internal Medicine

## 2022-10-10 DIAGNOSIS — Z13820 Encounter for screening for osteoporosis: Secondary | ICD-10-CM

## 2022-10-14 ENCOUNTER — Encounter: Payer: Medicare HMO | Admitting: Internal Medicine

## 2022-10-15 ENCOUNTER — Ambulatory Visit (INDEPENDENT_AMBULATORY_CARE_PROVIDER_SITE_OTHER): Payer: Medicare HMO | Admitting: *Deleted

## 2022-10-15 DIAGNOSIS — E538 Deficiency of other specified B group vitamins: Secondary | ICD-10-CM

## 2022-10-15 MED ORDER — CYANOCOBALAMIN 1000 MCG/ML IJ SOLN
1000.0000 ug | Freq: Once | INTRAMUSCULAR | Status: AC
Start: 2022-10-15 — End: 2022-10-15
  Administered 2022-10-15: 1000 ug via INTRAMUSCULAR

## 2022-10-15 NOTE — Progress Notes (Signed)
Per orders of Dr. Hernandez, injection of B12 given by Corian Handley. Patient tolerated injection well.  

## 2022-10-17 ENCOUNTER — Telehealth: Payer: Self-pay

## 2022-10-17 ENCOUNTER — Ambulatory Visit (INDEPENDENT_AMBULATORY_CARE_PROVIDER_SITE_OTHER): Payer: Medicare HMO

## 2022-10-17 ENCOUNTER — Ambulatory Visit: Payer: Medicare HMO | Attending: Cardiovascular Disease

## 2022-10-17 ENCOUNTER — Encounter: Payer: Self-pay | Admitting: Cardiovascular Disease

## 2022-10-17 DIAGNOSIS — I442 Atrioventricular block, complete: Secondary | ICD-10-CM

## 2022-10-17 DIAGNOSIS — Z950 Presence of cardiac pacemaker: Secondary | ICD-10-CM | POA: Diagnosis not present

## 2022-10-17 DIAGNOSIS — I5042 Chronic combined systolic (congestive) and diastolic (congestive) heart failure: Secondary | ICD-10-CM | POA: Diagnosis not present

## 2022-10-17 LAB — CUP PACEART REMOTE DEVICE CHECK
Battery Remaining Longevity: 53 mo
Battery Remaining Percentage: 63 %
Battery Voltage: 2.98 V
Brady Statistic AP VP Percent: 1 %
Brady Statistic AP VS Percent: 1 %
Brady Statistic AS VP Percent: 99 %
Brady Statistic AS VS Percent: 1 %
Brady Statistic RA Percent Paced: 1 %
Date Time Interrogation Session: 20240808040015
Implantable Lead Connection Status: 753985
Implantable Lead Connection Status: 753985
Implantable Lead Connection Status: 753985
Implantable Lead Implant Date: 20140721
Implantable Lead Implant Date: 20141117
Implantable Lead Implant Date: 20141117
Implantable Lead Location: 753858
Implantable Lead Location: 753859
Implantable Lead Location: 753860
Implantable Lead Model: 5071
Implantable Pulse Generator Implant Date: 20220207
Lead Channel Impedance Value: 400 Ohm
Lead Channel Impedance Value: 410 Ohm
Lead Channel Impedance Value: 580 Ohm
Lead Channel Pacing Threshold Amplitude: 0.5 V
Lead Channel Pacing Threshold Amplitude: 0.75 V
Lead Channel Pacing Threshold Amplitude: 1.25 V
Lead Channel Pacing Threshold Pulse Width: 0.4 ms
Lead Channel Pacing Threshold Pulse Width: 0.4 ms
Lead Channel Pacing Threshold Pulse Width: 0.5 ms
Lead Channel Sensing Intrinsic Amplitude: 0.7 mV
Lead Channel Sensing Intrinsic Amplitude: 11.3 mV
Lead Channel Setting Pacing Amplitude: 2 V
Lead Channel Setting Pacing Amplitude: 2.5 V
Lead Channel Setting Pacing Amplitude: 2.5 V
Lead Channel Setting Pacing Pulse Width: 0.4 ms
Lead Channel Setting Pacing Pulse Width: 0.5 ms
Lead Channel Setting Sensing Sensitivity: 2 mV
Pulse Gen Model: 3222
Pulse Gen Serial Number: 3859407

## 2022-10-17 NOTE — Progress Notes (Signed)
EPIC Encounter for ICM Monitoring  Patient Name: [name] is a 72 y.o. female Date: 10/17/2022 Primary Care Physican: Philip Aspen, Limmie Patricia, MD Primary Cardiologist: Croitoru Electrophysiologist: Croitoru Bi-V Pacing: >99%        Attempted call to patient to recheck CorVue readings for Dr Royann Shivers.  Unable to reach patient.     CorVue thoracic impedance suggesting normal 8/8 but was suggesting possible dryness from 7/25-7/30 and 7/31-8/3.  Was suggesting possible fluid accumulation from 7/13-7/24 which correlates with the time patient had fluid symptoms.   Prescribed:  Furosemide 20 mg take 1 tablet(s) (20 mg total) by mouth daily.  Recommendations: Unable to reach.    Follow-up plan: 91 day device clinic remote transmission 01/16/2023.    Copy of ICM check sent to Dr. Royann Shivers.   3 month ICM trend: 10/17/2022.    12-14 Month ICM trend:     Karie Soda, RN 10/17/2022 1:12 PM

## 2022-10-17 NOTE — Telephone Encounter (Signed)
Remote ICM transmission received.  Attempted call to patient regarding ICM remote transmission and no answer.  

## 2022-10-22 ENCOUNTER — Ambulatory Visit (INDEPENDENT_AMBULATORY_CARE_PROVIDER_SITE_OTHER): Payer: Medicare HMO

## 2022-10-22 DIAGNOSIS — E538 Deficiency of other specified B group vitamins: Secondary | ICD-10-CM

## 2022-10-22 MED ORDER — CYANOCOBALAMIN 1000 MCG/ML IJ SOLN
1000.0000 ug | Freq: Once | INTRAMUSCULAR | Status: AC
Start: 2022-10-22 — End: 2022-10-22
  Administered 2022-10-22: 1000 ug via INTRAMUSCULAR

## 2022-10-22 NOTE — Progress Notes (Signed)
Pt here for monthly B12 injection per Dr. Caryl Never.  B12 given IM and pt tolerated injection well.

## 2022-10-30 ENCOUNTER — Other Ambulatory Visit: Payer: Self-pay | Admitting: Internal Medicine

## 2022-10-30 DIAGNOSIS — E1159 Type 2 diabetes mellitus with other circulatory complications: Secondary | ICD-10-CM

## 2022-11-06 ENCOUNTER — Encounter: Payer: Self-pay | Admitting: Internal Medicine

## 2022-11-06 ENCOUNTER — Ambulatory Visit (HOSPITAL_BASED_OUTPATIENT_CLINIC_OR_DEPARTMENT_OTHER): Payer: Medicare HMO

## 2022-11-12 ENCOUNTER — Ambulatory Visit (HOSPITAL_BASED_OUTPATIENT_CLINIC_OR_DEPARTMENT_OTHER)
Admission: RE | Admit: 2022-11-12 | Discharge: 2022-11-12 | Disposition: A | Discharge status: Home / Self Care | Payer: Medicare HMO | Source: Ambulatory Visit | Attending: Internal Medicine | Admitting: Internal Medicine

## 2022-11-12 DIAGNOSIS — M8589 Other specified disorders of bone density and structure, multiple sites: Secondary | ICD-10-CM | POA: Diagnosis not present

## 2022-11-12 DIAGNOSIS — M8588 Other specified disorders of bone density and structure, other site: Secondary | ICD-10-CM | POA: Diagnosis not present

## 2022-11-12 DIAGNOSIS — Z13820 Encounter for screening for osteoporosis: Secondary | ICD-10-CM | POA: Insufficient documentation

## 2022-11-12 DIAGNOSIS — Z8262 Family history of osteoporosis: Secondary | ICD-10-CM | POA: Diagnosis not present

## 2022-11-12 DIAGNOSIS — E119 Type 2 diabetes mellitus without complications: Secondary | ICD-10-CM | POA: Diagnosis not present

## 2022-11-13 ENCOUNTER — Other Ambulatory Visit: Payer: Self-pay | Admitting: Cardiovascular Disease

## 2022-11-13 ENCOUNTER — Other Ambulatory Visit: Payer: Self-pay | Admitting: Internal Medicine

## 2022-11-13 DIAGNOSIS — E1159 Type 2 diabetes mellitus with other circulatory complications: Secondary | ICD-10-CM

## 2022-11-13 DIAGNOSIS — I10 Essential (primary) hypertension: Secondary | ICD-10-CM

## 2022-11-13 DIAGNOSIS — E782 Mixed hyperlipidemia: Secondary | ICD-10-CM

## 2022-11-13 DIAGNOSIS — I252 Old myocardial infarction: Secondary | ICD-10-CM

## 2022-12-03 NOTE — Progress Notes (Signed)
Remote pacemaker transmission.   

## 2022-12-13 ENCOUNTER — Ambulatory Visit: Payer: Medicare HMO | Admitting: Dietician

## 2023-01-15 LAB — CUP PACEART REMOTE DEVICE CHECK
Battery Remaining Longevity: 49 mo
Battery Remaining Percentage: 60 %
Battery Voltage: 2.98 V
Brady Statistic AP VP Percent: 1 %
Brady Statistic AP VS Percent: 1 %
Brady Statistic AS VP Percent: 99 %
Brady Statistic AS VS Percent: 1 %
Brady Statistic RA Percent Paced: 1 %
Date Time Interrogation Session: 20241106065741
Implantable Lead Connection Status: 753985
Implantable Lead Connection Status: 753985
Implantable Lead Connection Status: 753985
Implantable Lead Implant Date: 20140721
Implantable Lead Implant Date: 20141117
Implantable Lead Implant Date: 20141117
Implantable Lead Location: 753858
Implantable Lead Location: 753859
Implantable Lead Location: 753860
Implantable Lead Model: 5071
Implantable Pulse Generator Implant Date: 20220207
Lead Channel Impedance Value: 400 Ohm
Lead Channel Impedance Value: 400 Ohm
Lead Channel Impedance Value: 510 Ohm
Lead Channel Pacing Threshold Amplitude: 0.5 V
Lead Channel Pacing Threshold Amplitude: 0.75 V
Lead Channel Pacing Threshold Amplitude: 1.25 V
Lead Channel Pacing Threshold Pulse Width: 0.4 ms
Lead Channel Pacing Threshold Pulse Width: 0.4 ms
Lead Channel Pacing Threshold Pulse Width: 0.5 ms
Lead Channel Sensing Intrinsic Amplitude: 0.8 mV
Lead Channel Sensing Intrinsic Amplitude: 8.4 mV
Lead Channel Setting Pacing Amplitude: 2 V
Lead Channel Setting Pacing Amplitude: 2.5 V
Lead Channel Setting Pacing Amplitude: 2.5 V
Lead Channel Setting Pacing Pulse Width: 0.4 ms
Lead Channel Setting Pacing Pulse Width: 0.5 ms
Lead Channel Setting Sensing Sensitivity: 2 mV
Pulse Gen Model: 3222
Pulse Gen Serial Number: 3859407

## 2023-01-16 ENCOUNTER — Ambulatory Visit (INDEPENDENT_AMBULATORY_CARE_PROVIDER_SITE_OTHER): Payer: Medicare HMO

## 2023-01-16 ENCOUNTER — Ambulatory Visit: Payer: Medicare HMO

## 2023-01-16 DIAGNOSIS — I442 Atrioventricular block, complete: Secondary | ICD-10-CM | POA: Diagnosis not present

## 2023-01-22 ENCOUNTER — Other Ambulatory Visit: Payer: Self-pay | Admitting: Cardiovascular Disease

## 2023-01-22 NOTE — Telephone Encounter (Signed)
Prescription refill request for Xarelto received.  Indication: Last office visit: 07/08/22  Judie Petit Croitoru MD Weight: 69.7kg Age: 72 Scr: 0.76 on 09/02/22  Epic CrCl: 75.79  Based on above findings Xarelto 20mg  daily is the appropriate dose.  Refill approved.

## 2023-01-28 ENCOUNTER — Telehealth: Payer: Self-pay | Admitting: Internal Medicine

## 2023-01-28 ENCOUNTER — Ambulatory Visit (INDEPENDENT_AMBULATORY_CARE_PROVIDER_SITE_OTHER): Payer: Medicare HMO | Admitting: Internal Medicine

## 2023-01-28 ENCOUNTER — Encounter: Payer: Self-pay | Admitting: Internal Medicine

## 2023-01-28 VITALS — BP 120/70 | HR 80 | Temp 97.8°F | Wt 150.8 lb

## 2023-01-28 DIAGNOSIS — E1159 Type 2 diabetes mellitus with other circulatory complications: Secondary | ICD-10-CM | POA: Diagnosis not present

## 2023-01-28 DIAGNOSIS — E538 Deficiency of other specified B group vitamins: Secondary | ICD-10-CM | POA: Diagnosis not present

## 2023-01-28 LAB — POCT GLYCOSYLATED HEMOGLOBIN (HGB A1C): Hemoglobin A1C: 6.5 % — AB (ref 4.0–5.6)

## 2023-01-28 MED ORDER — CYANOCOBALAMIN 1000 MCG/ML IJ SOLN
1000.0000 ug | Freq: Once | INTRAMUSCULAR | Status: AC
Start: 2023-01-28 — End: 2023-01-28
  Administered 2023-01-28: 1000 ug via INTRAMUSCULAR

## 2023-01-28 NOTE — Addendum Note (Signed)
Addended by: Kern Reap B on: 01/28/2023 09:36 AM   Modules accepted: Orders

## 2023-01-28 NOTE — Telephone Encounter (Signed)
Pt states she is supposed to continue with her B12 shots, but she can't remember when she needs to come in again. She thinks it may be once a month, but would like to verify. Please advise.

## 2023-01-28 NOTE — Progress Notes (Signed)
Established Patient Office Visit     CC/Reason for Visit: Follow-up chronic conditions  HPI: [name] is a 72 y.o. female who is coming in today for the above mentioned reasons. Past Medical History is significant for: Coronary artery disease, hyperlipidemia, type 2 diabetes, A-fib, vitamin D and B12 deficiencies.  Feeling well without acute concerns or complaints.  She is requesting B12 injection today.   Past Medical/Surgical History: Past Medical History:  Diagnosis Date   Acute on chronic combined systolic and diastolic CHF, NYHA class 3 (HCC) 03/19/2013   AICD (automatic cardioverter/defibrillator) present 01/2013   Biventricular cardiac pacemaker in situ    Allergic rhinitis    Anogenital warts 01/22/2011   Anxiety state, unspecified 05/13/2013   BREAST CANCER, HX OF 09/19/2009   Qualifier: Diagnosis of  By: Tawanna Cooler MD, Tinnie Gens A    CAD (coronary artery disease)    a. 2004: s/p MI in [place]. No PCI->Medical RX;  b. 07/2012 Cath: LM 30-40, LAD 70p, 70/56m, D1 80-90p, OM1 small 90p, OM2 large 80-90p, 1m, 70-80d, RCA 20-30 diff, EF 40%, glob HK.s/p CABG   Cancer of left breast (HCC) 2002   Patient reports left breast cancer diagnosis in 2002 treated with bilateral mastectomy positive lymph nodes with left axillary dissection followed by chemotherapy of unknown type   Cataract    Chronic combined systolic and diastolic CHF, NYHA class 2 (HCC) 01/08/2013   Diabetes mellitus type II    Exertional dyspnea 08/05/2014   Exertional shortness of breath    Generalized osteoarthrosis, involving multiple sites 08/25/2015   History of colon polyps    Hyperlipidemia    Hypertension    Incisional hernia, without obstruction or gangrene 05/04/2015   Ischemic cardiomyopathy    a. 07/2012 Echo: EF 35%, Sev inferoseptal HK, mildly dil LA, Peak PASP .   LBBB (left bundle branch block)    a. intermittent - present during rapid afib 07/2012.   MYOCARDIAL INFARCTION, HX OF  09/19/2009   Qualifier: Diagnosis of  By: Tawanna Cooler MD, Eugenio Hoes    Neuromuscular disorder [facility])    Patient reports chronic numbness in the right foot related to previous surgery on the right leg and "nerve damage"   PAF (paroxysmal atrial fibrillation) (HCC)    a. 07/2012: Amio and xarelto initiated.   Right carotid bruit 08/11/2012   SBO (small bowel obstruction) (HCC)    SOB (shortness of breath) 02/26/2018   Type 2 diabetes mellitus with circulatory disorder, without long-term current use of insulin (HCC) 10/03/2015   Vitamin D deficiency 02/22/2016    Past Surgical History:  Procedure Laterality Date   ABDOMINAL HYSTERECTOMY  2000   APPENDECTOMY  1974   BI-VENTRICULAR PACEMAKER INSERTION N/A 01/25/2013   Procedure: BI-VENTRICULAR PACEMAKER INSERTION (CRT-P);  Surgeon: Marinus Maw, MD;  Location: [facility] CATH LAB;  Service: Cardiovascular;  Laterality: N/A;   BREAST BIOPSY Left 2002   CARDIAC CATHETERIZATION     CHOLECYSTECTOMY OPEN  1974   CORONARY ARTERY BYPASS GRAFT N/A 09/28/2012   Procedure: CORONARY ARTERY BYPASS GRAFTING (CABG);  Surgeon: Delight Ovens, MD;  Location: [facility] OR;  Service: Open Heart Surgery;  Laterality: N/A;  CABG x four, using left internal mammary artery and left leg greater saphenous vein harvested endoscopically   DILATION AND CURETTAGE OF UTERUS  1983   EPICARDIAL PACING LEAD PLACEMENT N/A 09/28/2012   Procedure: EPICARDIAL PACING LEAD PLACEMENT;  Surgeon: Delight Ovens, MD;  Location: [facility] OR;  Service: Thoracic;  Laterality:  N/A;  LV LEAD PLACEMENT   HERNIA REPAIR     INCISIONAL HERNIA REPAIR N/A 05/25/2015   Procedure: LAPAROSCOPIC INCISIONAL HERNIA WITH MESH ;  Surgeon: Emelia Loron, MD;  Location[facility] OR;  Service: General;  Laterality: N/A;   INCONTINENCE SURGERY  2000   INSERTION OF MESH N/A 05/25/2015   Procedure: INSERTION OF MESH;  Surgeon: Emelia Loron, MD;  Location: [facility] OR;  Service: General;  Laterality: N/A;   INTRAOPERATIVE  TRANSESOPHAGEAL ECHOCARDIOGRAM N/A 09/28/2012   Procedure: INTRAOPERATIVE TRANSESOPHAGEAL ECHOCARDIOGRAM;  Surgeon: Delight Ovens, MD;  Location: [facility] OR;  Service: Open Heart Surgery;  Laterality: N/A;   LAPAROSCOPIC INCISIONAL / UMBILICAL / VENTRAL HERNIA REPAIR  05/25/2015   IHR   LEFT HEART CATHETERIZATION WITH CORONARY ANGIOGRAM N/A 07/20/2012   Procedure: LEFT HEART CATHETERIZATION WITH CORONARY ANGIOGRAM;  Surgeon: Peter M Swaziland, MD;  Location: [facility] CATH LAB;  Service: Cardiovascular;  Laterality: N/A;   MASTECTOMY Right 2002   MASTECTOMY MODIFIED RADICAL W/ AXILLARY LYMPH NODES W/ OR W/O PECTORALIS MINOR Left 2002   MAZE N/A 09/28/2012   Procedure: MAZE;  Surgeon: Delight Ovens, MD;  Location: [facility] OR;  Service: Open Heart Surgery;  Laterality: N/A;   ORIF WRIST FRACTURE Right 11/08/2017   Procedure: OPEN REDUCTION INTERNAL FIXATION (ORIF) WRIST FRACTURE;  Surgeon: Dominica Severin, MD;  Location: [facility] OR;  Service: Orthopedics;  Laterality: Right;  90 mins   PPM GENERATOR CHANGEOUT N/A 04/17/2020   Procedure: PPM GENERATOR CHANGEOUT;  Surgeon: Thurmon Fair, MD;  Location: [facility] INVASIVE CV LAB;  Service: Cardiovascular;  Laterality: N/A;   TENDON REPAIR Right 2001 X 3-4   torn ligaments and tendons in ankle up to knee from work related accident   TUBAL LIGATION  1987    Social History:  reports that she quit smoking about 10 years ago. Her smoking use included cigarettes. She started smoking about 20 years ago. She has a 10 pack-year smoking history. She has never used smokeless tobacco. She reports that she does not drink alcohol and does not use drugs.  Allergies: Allergies  Allergen Reactions   Statins Itching    Myalgias with rosuvastatin, atorvastatin and simvastatin     Family History:  Family History  Problem Relation Age of Onset   Kidney failure Mother    Heart disease Mother        Died mid 74s complications of diabetes   Diabetes Mother    Colon cancer Sister     Arthritis Other    Diabetes Other    Hyperlipidemia Other    Ovarian cancer Other    Liver cancer Brother    Stomach cancer Neg Hx    Rectal cancer Neg Hx    Esophageal cancer Neg Hx    Pancreatic cancer Neg Hx      Current Outpatient Medications:    APPLE CIDER VINEGAR PO, Take 5,220 mg by mouth in the morning, at noon, and at bedtime. 1740 mg each, Disp: , Rfl:    Blood Glucose Monitoring Suppl (TRUE METRIX AIR GLUCOSE METER) DEVI, Check blood sugar 2 times a day, Disp: 1 each, Rfl: 0   carvedilol (COREG) 12.5 MG tablet, Take 2 tablets by Mouth Twice a day., Disp: 360 tablet, Rfl: 3   Cholecalciferol (VITAMIN D3) 50 MCG (2000 UT) capsule, TAKE 1 CAPSULE (2000 UNITS TOTAL) BY MOUTH DAILY., Disp: 90 capsule, Rfl: 3   Dulaglutide (TRULICITY) 1.5 MG/0.5ML SOPN, INJECT 1.5MG  (1 PEN) UNDER THE SKIN EVERY 7 DAYS, Disp: 6  mL, Rfl: 3   furosemide (LASIX) 20 MG tablet, TAKE 1 TABLET EVERY DAY, Disp: 90 tablet, Rfl: 3   glipiZIDE (GLUCOTROL) 5 MG tablet, TAKE 1 TABLET TWICE DAILY BEFORE MEALS, Disp: 180 tablet, Rfl: 3   glucose blood (TRUE METRIX BLOOD GLUCOSE TEST) test strip, Use to check blood sugar 2 times a day., Disp: 200 each, Rfl: 12   isosorbide mononitrate (IMDUR) 60 MG 24 hr tablet, TAKE 1 TABLET EVERY DAY, Disp: 90 tablet, Rfl: 3   JARDIANCE 25 MG TABS tablet, TAKE 1 TABLET EVERY DAY BEFORE BREAKFAST, Disp: 90 tablet, Rfl: 3   losartan (COZAAR) 25 MG tablet, TAKE 1 TABLET TWICE DAILY, Disp: 180 tablet, Rfl: 3   metFORMIN (GLUCOPHAGE) 1000 MG tablet, TAKE 1 TABLET TWICE DAILY WITH FOOD, Disp: 180 tablet, Rfl: 3   nitroGLYCERIN (NITROSTAT) 0.4 MG SL tablet, Place 1 tablet (0.4 mg total) under the tongue every 5 (five) minutes as needed for chest pain., Disp: 25 tablet, Rfl: 3   REPATHA SURECLICK 140 MG/ML SOAJ, INJECT 140 MG INTO THE SKIN EVERY 14 (FOURTEEN) DAYS., Disp: 6 mL, Rfl: 3   rivaroxaban (XARELTO) 20 MG TABS tablet, TAKE 1 TABLET EVERY DAY WITH SUPPER, Disp: 90 tablet, Rfl: 1    TRUEplus Lancets 33G MISC, Use to check blood sugar 2 times a day., Disp: 200 each, Rfl: 12  Review of Systems:  Negative unless indicated in HPI.   Physical Exam: Vitals:   01/28/23 0824  BP: 120/70  Pulse: 80  Temp: 97.8 F (36.6 C)  TempSrc: Oral  SpO2: 99%  Weight: 150 lb 12.8 oz (68.4 kg)    Body mass index is 25.88 kg/m.   Physical Exam Vitals reviewed.  Constitutional:      Appearance: Normal appearance.  HENT:     Head: Normocephalic and atraumatic.  Eyes:     Conjunctiva/sclera: Conjunctivae normal.     Pupils: Pupils are equal, round, and reactive to light.  Cardiovascular:     Rate and Rhythm: Normal rate and regular rhythm.  Pulmonary:     Effort: Pulmonary effort is normal.     Breath sounds: Normal breath sounds.  Skin:    General: Skin is warm and dry.  Neurological:     General: No focal deficit present.     Mental Status: She is alert and oriented to person, place, and time.  Psychiatric:        Mood and Affect: Mood normal.        Behavior: Behavior normal.        Thought Content: Thought content normal.        Judgment: Judgment normal.      Impression and Plan:  Type 2 diabetes mellitus with other circulatory complication, without long-term current use of insulin (HCC) -     POCT glycosylated hemoglobin (Hb A1C)  Vitamin B12 deficiency  -A1c of 6.5 demonstrates excellent diabetic control. -Cholesterol was at goal as of last check, she continues on Repatha. -B12 IM injection given in office today.   Time spent:30 minutes reviewing chart, interviewing and examining patient and formulating plan of care.     Chaya Jan, MD [facility]

## 2023-01-28 NOTE — Progress Notes (Signed)
Remote pacemaker transmission.   

## 2023-01-28 NOTE — Telephone Encounter (Signed)
Patient is aware and will call back to schedule an appointment 

## 2023-02-04 NOTE — Progress Notes (Signed)
Remote pacemaker transmission.   

## 2023-02-17 DIAGNOSIS — D3132 Benign neoplasm of left choroid: Secondary | ICD-10-CM | POA: Diagnosis not present

## 2023-02-17 DIAGNOSIS — H35372 Puckering of macula, left eye: Secondary | ICD-10-CM | POA: Diagnosis not present

## 2023-02-17 DIAGNOSIS — H35033 Hypertensive retinopathy, bilateral: Secondary | ICD-10-CM | POA: Diagnosis not present

## 2023-02-17 DIAGNOSIS — E113393 Type 2 diabetes mellitus with moderate nonproliferative diabetic retinopathy without macular edema, bilateral: Secondary | ICD-10-CM | POA: Diagnosis not present

## 2023-02-20 ENCOUNTER — Other Ambulatory Visit: Payer: Self-pay | Admitting: *Deleted

## 2023-02-20 MED ORDER — CYCLOBENZAPRINE HCL 5 MG PO TABS: 5.0000 mg | ORAL_TABLET | Freq: Every day | ORAL | 1 refills | Status: DC

## 2023-02-20 NOTE — Telephone Encounter (Signed)
Patient is requesting a refill of: Cyclobenzaprine 5 mg. Not on current medication list. Okay to fill? [facility]

## 2023-02-27 ENCOUNTER — Ambulatory Visit: Payer: Medicare HMO | Admitting: *Deleted

## 2023-02-27 ENCOUNTER — Ambulatory Visit: Payer: Medicare HMO | Admitting: Internal Medicine

## 2023-02-27 DIAGNOSIS — E538 Deficiency of other specified B group vitamins: Secondary | ICD-10-CM

## 2023-02-27 MED ORDER — CYANOCOBALAMIN 1000 MCG/ML IJ SOLN
1000.0000 ug | Freq: Once | INTRAMUSCULAR | Status: AC
Start: 2023-02-27 — End: 2023-02-27
  Administered 2023-02-27: 1000 ug via INTRAMUSCULAR

## 2023-02-27 NOTE — Progress Notes (Signed)
Per orders of Dr. Hernandez, injection of b12 given by Chetan Mehring. Patient tolerated injection well.  

## 2023-03-07 ENCOUNTER — Other Ambulatory Visit: Payer: Medicare HMO

## 2023-04-01 ENCOUNTER — Ambulatory Visit: Payer: Medicare HMO | Admitting: Internal Medicine

## 2023-04-01 ENCOUNTER — Ambulatory Visit (INDEPENDENT_AMBULATORY_CARE_PROVIDER_SITE_OTHER): Payer: Medicare HMO | Admitting: *Deleted

## 2023-04-01 DIAGNOSIS — E538 Deficiency of other specified B group vitamins: Secondary | ICD-10-CM | POA: Diagnosis not present

## 2023-04-01 MED ORDER — CYANOCOBALAMIN 1000 MCG/ML IJ SOLN
1000.0000 ug | Freq: Once | INTRAMUSCULAR | Status: AC
  Administered 2023-04-01: 1000 ug via INTRAMUSCULAR

## 2023-04-01 NOTE — Progress Notes (Signed)
Per orders of Dr. Hernandez, injection of B12 given by Dailyn Reith. Patient tolerated injection well.  

## 2023-04-07 ENCOUNTER — Encounter: Payer: Self-pay | Admitting: Internal Medicine

## 2023-04-07 DIAGNOSIS — E559 Vitamin D deficiency, unspecified: Secondary | ICD-10-CM

## 2023-04-07 DIAGNOSIS — E538 Deficiency of other specified B group vitamins: Secondary | ICD-10-CM

## 2023-04-14 ENCOUNTER — Other Ambulatory Visit (INDEPENDENT_AMBULATORY_CARE_PROVIDER_SITE_OTHER): Payer: Medicare HMO

## 2023-04-14 ENCOUNTER — Encounter: Payer: Self-pay | Admitting: Internal Medicine

## 2023-04-14 ENCOUNTER — Ambulatory Visit: Payer: Medicare HMO | Admitting: Internal Medicine

## 2023-04-14 DIAGNOSIS — E538 Deficiency of other specified B group vitamins: Secondary | ICD-10-CM | POA: Diagnosis not present

## 2023-04-14 DIAGNOSIS — E559 Vitamin D deficiency, unspecified: Secondary | ICD-10-CM | POA: Diagnosis not present

## 2023-04-14 LAB — VITAMIN D 25 HYDROXY (VIT D DEFICIENCY, FRACTURES): VITD: 42.97 ng/mL (ref 30.00–100.00)

## 2023-04-14 LAB — VITAMIN B12: Vitamin B-12: 412 pg/mL (ref 211–911)

## 2023-04-17 ENCOUNTER — Ambulatory Visit (INDEPENDENT_AMBULATORY_CARE_PROVIDER_SITE_OTHER): Payer: Medicare HMO

## 2023-04-17 ENCOUNTER — Telehealth: Payer: Self-pay | Admitting: Cardiovascular Disease

## 2023-04-17 DIAGNOSIS — I442 Atrioventricular block, complete: Secondary | ICD-10-CM

## 2023-04-17 LAB — CUP PACEART REMOTE DEVICE CHECK
Battery Remaining Longevity: 46 mo
Battery Remaining Percentage: 56 %
Battery Voltage: 2.98 V
Brady Statistic AP VP Percent: 1 %
Brady Statistic AP VS Percent: 1 %
Brady Statistic AS VP Percent: 99 %
Brady Statistic AS VS Percent: 1 %
Brady Statistic RA Percent Paced: 1 %
Date Time Interrogation Session: 20250205090522
Implantable Lead Connection Status: 753985
Implantable Lead Connection Status: 753985
Implantable Lead Connection Status: 753985
Implantable Lead Implant Date: 20140721
Implantable Lead Implant Date: 20141117
Implantable Lead Implant Date: 20141117
Implantable Lead Location: 753858
Implantable Lead Location: 753859
Implantable Lead Location: 753860
Implantable Lead Model: 5071
Implantable Pulse Generator Implant Date: 20220207
Lead Channel Impedance Value: 400 Ohm
Lead Channel Impedance Value: 400 Ohm
Lead Channel Impedance Value: 510 Ohm
Lead Channel Pacing Threshold Amplitude: 0.5 V
Lead Channel Pacing Threshold Amplitude: 0.75 V
Lead Channel Pacing Threshold Amplitude: 1.25 V
Lead Channel Pacing Threshold Pulse Width: 0.4 ms
Lead Channel Pacing Threshold Pulse Width: 0.4 ms
Lead Channel Pacing Threshold Pulse Width: 0.5 ms
Lead Channel Sensing Intrinsic Amplitude: 0.7 mV
Lead Channel Sensing Intrinsic Amplitude: 8.4 mV
Lead Channel Setting Pacing Amplitude: 2 V
Lead Channel Setting Pacing Amplitude: 2.5 V
Lead Channel Setting Pacing Amplitude: 2.5 V
Lead Channel Setting Pacing Pulse Width: 0.4 ms
Lead Channel Setting Pacing Pulse Width: 0.5 ms
Lead Channel Setting Sensing Sensitivity: 2 mV
Pulse Gen Model: 3222
Pulse Gen Serial Number: 3859407

## 2023-04-17 NOTE — Telephone Encounter (Signed)
 Patient identification verified by 2 forms. Michelle Cooks, RN    Received call from patient  Patient states:   -she is having some SOB   -occurs with activity such as making the bed  -main concern is that she is constantly tired and has no energy   -fatigue started at the end of October   -SOB on going for at least one month  -she recently started paying attention to breathing  Patient denies:   -chest pain   -heart palpitation  Offered 2/7 OV, patient declined, requests OV for next week  Patient scheduled for 2/13 at 8:25am  Reviewed ED warning signs/precautions  Patient verbalized understanding, no questions at this time

## 2023-04-17 NOTE — Telephone Encounter (Signed)
 Pt c/o Shortness Of Breath: STAT if SOB developed within the last 24 hours or pt is noticeably SOB on the phone  1. Are you currently SOB (can you hear that pt is SOB on the phone)? Yes   2. How long have you been experiencing SOB? Since late October  3. Are you SOB when sitting or when up moving around? Moving around  4. Are you currently experiencing any other symptoms? Extreme fatigue, headaches

## 2023-04-19 ENCOUNTER — Encounter: Payer: Self-pay | Admitting: Cardiovascular Disease

## 2023-04-24 ENCOUNTER — Ambulatory Visit: Payer: Medicare HMO | Admitting: Nurse Practitioner

## 2023-04-24 NOTE — Progress Notes (Deleted)
 Office Visit    Patient Name: Laqueta Bonaventura Date of Encounter: 04/24/2023  Primary Care Provider:  Philip Aspen, Limmie Patricia, MD Primary Cardiologist:  Thurmon Fair, MD  Chief Complaint    73 year old female with a history of CAD s/p CABG x 4 (LIMA-LAD, SVG-ramus, sequential SVG-OM1 and OM 2) in 2014, paroxysmal atrial fibrillation s/p surgical maze procedure, chronic systolic heart failure with recovered EF, ICM s/p CRT-P [facility]), hypertension, hyperlipidemia, type 2 diabetes who presents for follow-up related to CAD.  Past Medical History    Past Medical History:  Diagnosis Date   Acute on chronic combined systolic and diastolic CHF, NYHA class 3 (HCC) 03/19/2013   AICD (automatic cardioverter/defibrillator) present 01/2013   Biventricular cardiac pacemaker in situ    Allergic rhinitis    Anogenital warts 01/22/2011   Anxiety state, unspecified 05/13/2013   BREAST CANCER, HX OF 09/19/2009   Qualifier: Diagnosis of  By: Tawanna Cooler MD, Tinnie Gens A    CAD (coronary artery disease)    a. 2004: s/p MI in [place]. No PCI->Medical RX;  b. 07/2012 Cath: LM 30-40, LAD 70p, 70/62m, D1 80-90p, OM1 small 90p, OM2 large 80-90p, 10m, 70-80d, RCA 20-30 diff, EF 40%, glob HK.s/p CABG   Cancer of left breast (HCC) 2002   Patient reports left breast cancer diagnosis in 2002 treated with bilateral mastectomy positive lymph nodes with left axillary dissection followed by chemotherapy of unknown type   Cataract    Chronic combined systolic and diastolic CHF, NYHA class 2 (HCC) 01/08/2013   Diabetes mellitus type II    Exertional dyspnea 08/05/2014   Exertional shortness of breath    Generalized osteoarthrosis, involving multiple sites 08/25/2015   History of colon polyps    Hyperlipidemia    Hypertension    Incisional hernia, without obstruction or gangrene 05/04/2015   Ischemic cardiomyopathy    a. 07/2012 Echo: EF 35%, Sev inferoseptal HK, mildly dil LA, Peak PASP .   LBBB (left bundle  branch block)    a. intermittent - present during rapid afib 07/2012.   MYOCARDIAL INFARCTION, HX OF 09/19/2009   Qualifier: Diagnosis of  By: Tawanna Cooler MD, Eugenio Hoes    Neuromuscular disorder [facility])    Patient reports chronic numbness in the right foot related to previous surgery on the right leg and "nerve damage"   PAF (paroxysmal atrial fibrillation) (HCC)    a. 07/2012: Amio and xarelto initiated.   Right carotid bruit 08/11/2012   SBO (small bowel obstruction) (HCC)    SOB (shortness of breath) 02/26/2018   Type 2 diabetes mellitus with circulatory disorder, without long-term current use of insulin (HCC) 10/03/2015   Vitamin D deficiency 02/22/2016   Past Surgical History:  Procedure Laterality Date   ABDOMINAL HYSTERECTOMY  2000   APPENDECTOMY  1974   BI-VENTRICULAR PACEMAKER INSERTION N/A 01/25/2013   Procedure: BI-VENTRICULAR PACEMAKER INSERTION (CRT-P);  Surgeon: Marinus Maw, MD;  Location: [facility] CATH LAB;  Service: Cardiovascular;  Laterality: N/A;   BREAST BIOPSY Left 2002   CARDIAC CATHETERIZATION     CHOLECYSTECTOMY OPEN  1974   CORONARY ARTERY BYPASS GRAFT N/A 09/28/2012   Procedure: CORONARY ARTERY BYPASS GRAFTING (CABG);  Surgeon: Delight Ovens, MD;  Location: [facility] OR;  Service: Open Heart Surgery;  Laterality: N/A;  CABG x four, using left internal mammary artery and left leg greater saphenous vein harvested endoscopically   DILATION AND CURETTAGE OF UTERUS  1983   EPICARDIAL PACING LEAD PLACEMENT N/A 09/28/2012   Procedure: EPICARDIAL  PACING LEAD PLACEMENT;  Surgeon: Delight Ovens, MD;  Location: [facility] OR;  Service: Thoracic;  Laterality: N/A;  LV LEAD PLACEMENT   HERNIA REPAIR     INCISIONAL HERNIA REPAIR N/A 05/25/2015   Procedure: LAPAROSCOPIC INCISIONAL HERNIA WITH MESH ;  Surgeon: Emelia Loron, MD;  Location: [facility] OR;  Service: General;  Laterality: N/A;   INCONTINENCE SURGERY  2000   INSERTION OF MESH N/A 05/25/2015   Procedure: INSERTION OF MESH;  Surgeon: Emelia Loron, MD;  Location: [facility] OR;  Service: General;  Laterality: N/A;   INTRAOPERATIVE TRANSESOPHAGEAL ECHOCARDIOGRAM N/A 09/28/2012   Procedure: INTRAOPERATIVE TRANSESOPHAGEAL ECHOCARDIOGRAM;  Surgeon: Delight Ovens, MD;  Location: [facility] OR;  Service: Open Heart Surgery;  Laterality: N/A;   LAPAROSCOPIC INCISIONAL / UMBILICAL / VENTRAL HERNIA REPAIR  05/25/2015   IHR   LEFT HEART CATHETERIZATION WITH CORONARY ANGIOGRAM N/A 07/20/2012   Procedure: LEFT HEART CATHETERIZATION WITH CORONARY ANGIOGRAM;  Surgeon: Peter M Swaziland, MD;  Location: [facility] CATH LAB;  Service: Cardiovascular;  Laterality: N/A;   MASTECTOMY Right 2002   MASTECTOMY MODIFIED RADICAL W/ AXILLARY LYMPH NODES W/ OR W/O PECTORALIS MINOR Left 2002   MAZE N/A 09/28/2012   Procedure: MAZE;  Surgeon: Delight Ovens, MD;  Location: [facility] OR;  Service: Open Heart Surgery;  Laterality: N/A;   ORIF WRIST FRACTURE Right 11/08/2017   Procedure: OPEN REDUCTION INTERNAL FIXATION (ORIF) WRIST FRACTURE;  Surgeon: Dominica Severin, MD;  Location: [facility] OR;  Service: Orthopedics;  Laterality: Right;  90 mins   PPM GENERATOR CHANGEOUT N/A 04/17/2020   Procedure: PPM GENERATOR CHANGEOUT;  Surgeon: Thurmon Fair, MD;  Location: [facility] INVASIVE CV LAB;  Service: Cardiovascular;  Laterality: N/A;   TENDON REPAIR Right 2001 X 3-4   torn ligaments and tendons in ankle up to knee from work related accident   TUBAL LIGATION  1987    Allergies  Allergies  Allergen Reactions   Statins Itching    Myalgias with rosuvastatin, atorvastatin and simvastatin      Labs/Other Studies Reviewed    The following studies were reviewed today:  Cardiac Studies & Procedures   ______________________________________________________________________________________________   STRESS TESTS  MYOCARDIAL PERFUSION IMAGING 04/28/2019  Narrative  The left ventricular ejection fraction is mildly decreased (45-54%).  Nuclear stress EF: 51%.  There was no ST segment deviation  noted during stress.  No T wave inversion was noted during stress.  Defect 1: There is a small defect of mild severity present in the apex location.  Defect 2: There is a small defect of mild severity present in the basal inferolateral location.  This is a low risk study.  Findings consistent with prior myocardial infarction.  Low risk nuclear scan with a small apical scar (previously described) and a possibly new small area of mild inferolateral ischemia (sum difference 4%) and borderline reduction in left ventricular systolic function (EF51%).   ECHOCARDIOGRAM  ECHOCARDIOGRAM COMPLETE 07/12/2021  Narrative ECHOCARDIOGRAM REPORT    Patient Name:   JUNIA NYGREN Date of Exam: 07/12/2021 Medical Rec #:  [id number]     Height:       64.5 in Accession #:    [id number]    Weight:       150.4 lb Date of Birth:  14-May-1950    BSA:          1.742 m Patient Age:    70 years      BP:           131/73 mmHg Patient Gender:  F             HR:           86 bpm. Exam Location:  [address]  Procedure: 2D Echo, Cardiac Doppler, Color Doppler and Strain Analysis  Indications:    I50.32 CHF  History:        Patient has prior history of Echocardiogram examinations, most recent 05/09/2014. Ischemic cardiomyopathy, Previous Myocardial Infarction and CAD, Pacemaker and Prior CABG, Breast Cancer, Arrythmias:Atrial Fibrillation; Risk Factors:Hypertension.  Sonographer:    Clearence Ped RCS Referring Phys: [phone] MIHAI CROITORU  IMPRESSIONS   1. Left ventricular ejection fraction, by estimation, is 55 to 60%. The left ventricle has normal function. The left ventricle has no regional wall motion abnormalities. Left ventricular diastolic parameters are indeterminate. The average left ventricular global longitudinal strain is -18.3 %. The global longitudinal strain is normal. 2. Right ventricular systolic function is normal. The right ventricular size is normal. There is normal pulmonary artery  systolic pressure. The estimated right ventricular systolic pressure is 22.0 mmHg. 3. The mitral valve is degenerative. Trivial mitral valve regurgitation. No evidence of mitral stenosis. 4. The aortic valve is tricuspid. Aortic valve regurgitation is not visualized. Aortic valve sclerosis is present, with no evidence of aortic valve stenosis. 5. The inferior vena cava is normal in size with greater than 50% respiratory variability, suggesting right atrial pressure of 3 mmHg.  FINDINGS Left Ventricle: Left ventricular ejection fraction, by estimation, is 55 to 60%. The left ventricle has normal function. The left ventricle has no regional wall motion abnormalities. The average left ventricular global longitudinal strain is -18.3 %. The global longitudinal strain is normal. The left ventricular internal cavity size was normal in size. There is no left ventricular hypertrophy. Left ventricular diastolic parameters are indeterminate. Normal left ventricular filling pressure.  Right Ventricle: The right ventricular size is normal. No increase in right ventricular wall thickness. Right ventricular systolic function is normal. There is normal pulmonary artery systolic pressure. The tricuspid regurgitant velocity is 2.18 m/s, and with an assumed right atrial pressure of 3 mmHg, the estimated right ventricular systolic pressure is 22.0 mmHg.  Left Atrium: Left atrial size was normal in size.  Right Atrium: Right atrial size was normal in size.  Pericardium: There is no evidence of pericardial effusion.  Mitral Valve: The mitral valve is degenerative in appearance. There is mild calcification of the mitral valve leaflet(s). Mild mitral annular calcification. Trivial mitral valve regurgitation. No evidence of mitral valve stenosis.  Tricuspid Valve: The tricuspid valve is normal in structure. Tricuspid valve regurgitation is trivial. No evidence of tricuspid stenosis.  Aortic Valve: The aortic valve is  tricuspid. Aortic valve regurgitation is not visualized. Aortic valve sclerosis is present, with no evidence of aortic valve stenosis.  Pulmonic Valve: The pulmonic valve was normal in structure. Pulmonic valve regurgitation is not visualized. No evidence of pulmonic stenosis.  Aorta: The aortic root is normal in size and structure.  Venous: The inferior vena cava is normal in size with greater than 50% respiratory variability, suggesting right atrial pressure of 3 mmHg.  IAS/Shunts: No atrial level shunt detected by color flow Doppler.   LEFT VENTRICLE PLAX 2D LVIDd:         3.90 cm   Diastology LVIDs:         2.80 cm   LV e' medial:    7.51 cm/s LV PW:         0.80 cm   LV E/e'  medial:  12.5 LV IVS:        0.90 cm   LV e' lateral:   9.79 cm/s LVOT diam:     2.00 cm   LV E/e' lateral: 9.6 LV SV:         49 LV SV Index:   28        2D Longitudinal Strain LVOT Area:     3.14 cm  2D Strain GLS (A2C):   -18.5 % 2D Strain GLS (A3C):   -17.5 % 2D Strain GLS (A4C):   -18.8 % 2D Strain GLS Avg:     -18.3 %  RIGHT VENTRICLE RV Basal diam:  3.50 cm RV S prime:     8.92 cm/s TAPSE (M-mode): 1.4 cm RVSP:           22.0 mmHg  LEFT ATRIUM             Index        RIGHT ATRIUM           Index LA diam:        3.50 cm 2.01 cm/m   RA Pressure: 3.00 mmHg LA Vol (A2C):   43.6 ml 25.02 ml/m  RA Area:     12.20 cm LA Vol (A4C):   40.6 ml 23.30 ml/m  RA Volume:   24.90 ml  14.29 ml/m LA Biplane Vol: 45.7 ml 26.23 ml/m AORTIC VALVE LVOT Vmax:   74.20 cm/s LVOT Vmean:  56.600 cm/s LVOT VTI:    0.155 m  AORTA Ao Root diam: 2.90 cm Ao Asc diam:  3.30 cm  MITRAL VALVE               TRICUSPID VALVE MV Area (PHT):             TR Peak grad:   19.0 mmHg MV Decel Time:             TR Vmax:        218.00 cm/s MV E velocity: 93.80 cm/s  Estimated RAP:  3.00 mmHg MV A velocity: 80.20 cm/s  RVSP:           22.0 mmHg MV E/A ratio:  1.17 SHUNTS Systemic VTI:  0.16 m Systemic Diam: 2.00  cm  Armanda Magic MD Electronically signed by Armanda Magic MD Signature Date/Time: 07/12/2021/5:02:20 PM    Final          ______________________________________________________________________________________________     Recent Labs: 09/02/2022: ALT 16; BUN 12; Creatinine, Ser 0.76; Potassium 4.3; Sodium 138; TSH 1.61  Recent Lipid Panel    Component Value Date/Time   CHOL 135 07/31/2020 0917   TRIG 157 (H) 07/31/2020 0917   HDL 61 07/31/2020 0917   CHOLHDL 2.2 07/31/2020 0917   CHOLHDL 5.2 (H) 04/04/2016 0723   VLDL NOT CALC 04/04/2016 0723   LDLCALC 48 07/31/2020 0917   LDLDIRECT 50 04/04/2016 0723    History of Present Illness    73 year old female with the above past medical history including CAD s/p CABG x 4 (LIMA-LAD, SVG-ramus, sequential SVG-OM1 and OM 2) in 2014, paroxysmal atrial fibrillation s/p surgical maze procedure, chronic systolic heart failure with recovered EF, ICM s/p CRT-P [facility]), hypertension, hyperlipidemia, and type 2 diabetes.  She has a history of MI in 2004.  She was hospitalized in May 2014 in the setting of acute heart failure, atrial fibrillation, three-vessel CAD which ultimately led to bypass surgery above.  She had persistent depressed LV systolic function and received a dual-chamber  biventricular pacemaker [facility] Allure), by Dr. Ladona Ridgel in November 2014.  Echocardiogram in 2016 showed EF recovered to 50 to 55%.  Nuclear stress test in February 2021 showed very minor abnormalities, previously described fixed apical defect, possible small area of basal inferolateral ischemia, EF 51%.  His recent echocardiogram in 07/2021 showed EF 55 to 60%, normal LV function, no RWMA, indeterminate diastolic parameters, normal RV systolic function, no significant valvular abnormalities.  Most recent device interrogation showed normal device function, greater than 99% CRT pacing, not pacemaker dependent, no clinically significant episodes of high ventricular  rate, low prevalence of paroxysmal atrial fibrillation (one 4-hour episode, overall less than 1% burden).  She contacted our office on 04/17/2023 with concern for dyspnea on exertion, fatigue, headaches since October 2024.  She presents today for follow-up.  Since her last visit  CAD: Dyspnea on exertion/fatigue: Paroxysmal atrial fibrillation: Chronic systolic heart failure/ICM: Hypertension: Hyperlipidemia: Type 2 diabetes: Disposition:  Home Medications    Current Outpatient Medications  Medication Sig Dispense Refill   APPLE CIDER VINEGAR PO Take 5,220 mg by mouth in the morning, at noon, and at bedtime. 1740 mg each     Blood Glucose Monitoring Suppl (TRUE METRIX AIR GLUCOSE METER) DEVI Check blood sugar 2 times a day 1 each 0   carvedilol (COREG) 12.5 MG tablet Take 2 tablets by Mouth Twice a day. 360 tablet 3   Cholecalciferol (VITAMIN D3) 50 MCG (2000 UT) capsule TAKE 1 CAPSULE (2000 UNITS TOTAL) BY MOUTH DAILY. 90 capsule 3   cyclobenzaprine (FLEXERIL) 5 MG tablet Take 1 tablet (5 mg total) by mouth at bedtime. 90 tablet 1   Dulaglutide (TRULICITY) 1.5 MG/0.5ML SOPN INJECT 1.5MG  (1 PEN) UNDER THE SKIN EVERY 7 DAYS 6 mL 3   furosemide (LASIX) 20 MG tablet TAKE 1 TABLET EVERY DAY 90 tablet 3   glipiZIDE (GLUCOTROL) 5 MG tablet TAKE 1 TABLET TWICE DAILY BEFORE MEALS 180 tablet 3   glucose blood (TRUE METRIX BLOOD GLUCOSE TEST) test strip Use to check blood sugar 2 times a day. 200 each 12   isosorbide mononitrate (IMDUR) 60 MG 24 hr tablet TAKE 1 TABLET EVERY DAY 90 tablet 3   JARDIANCE 25 MG TABS tablet TAKE 1 TABLET EVERY DAY BEFORE BREAKFAST 90 tablet 3   losartan (COZAAR) 25 MG tablet TAKE 1 TABLET TWICE DAILY 180 tablet 3   metFORMIN (GLUCOPHAGE) 1000 MG tablet TAKE 1 TABLET TWICE DAILY WITH FOOD 180 tablet 3   nitroGLYCERIN (NITROSTAT) 0.4 MG SL tablet Place 1 tablet (0.4 mg total) under the tongue every 5 (five) minutes as needed for chest pain. 25 tablet 3   REPATHA  SURECLICK 140 MG/ML SOAJ INJECT 140 MG INTO THE SKIN EVERY 14 (FOURTEEN) DAYS. 6 mL 3   rivaroxaban (XARELTO) 20 MG TABS tablet TAKE 1 TABLET EVERY DAY WITH SUPPER 90 tablet 1   TRUEplus Lancets 33G MISC Use to check blood sugar 2 times a day. 200 each 12   No current facility-administered medications for this visit.     Review of Systems    ***.  All other systems reviewed and are otherwise negative except as noted above.    Physical Exam    VS:  There were no vitals taken for this visit. , BMI There is no height or weight on file to calculate BMI.     GEN: Well nourished, well developed, in no acute distress. HEENT: normal. Neck: Supple, no JVD, carotid bruits, or masses. Cardiac: RRR, no murmurs,  rubs, or gallops. No clubbing, cyanosis, edema.  Radials/DP/PT 2+ and equal bilaterally.  Respiratory:  Respirations regular and unlabored, clear to auscultation bilaterally. GI: Soft, nontender, nondistended, BS + x 4. MS: no deformity or atrophy. Skin: warm and dry, no rash. Neuro:  Strength and sensation are intact. Psych: Normal affect.  Accessory Clinical Findings    ECG personally reviewed by me today -    - no acute changes.   Lab Results  Component Value Date   WBC 5.6 10/12/2021   HGB 12.5 10/12/2021   HCT 37.5 10/12/2021   MCV 92.1 10/12/2021   PLT 231 10/12/2021   Lab Results  Component Value Date   CREATININE 0.76 09/02/2022   BUN 12 09/02/2022   NA 138 09/02/2022   K 4.3 09/02/2022   CL 99 09/02/2022   CO2 27 09/02/2022   Lab Results  Component Value Date   ALT 16 09/02/2022   AST 14 09/02/2022   ALKPHOS 42 09/02/2022   BILITOT 0.7 09/02/2022   Lab Results  Component Value Date   CHOL 135 07/31/2020   HDL 61 07/31/2020   LDLCALC 48 07/31/2020   LDLDIRECT 50 04/04/2016   TRIG 157 (H) 07/31/2020   CHOLHDL 2.2 07/31/2020    Lab Results  Component Value Date   HGBA1C 6.5 (A) 01/28/2023    Assessment & Plan    1.  ***  No BP recorded.   {Refresh Note OR Click here to enter BP  :1}***   Joylene Grapes, NP 04/24/2023, 5:35 AM

## 2023-04-30 ENCOUNTER — Emergency Department (HOSPITAL_COMMUNITY): Payer: Medicare HMO

## 2023-04-30 ENCOUNTER — Other Ambulatory Visit: Payer: Self-pay

## 2023-04-30 ENCOUNTER — Other Ambulatory Visit: Payer: Self-pay | Admitting: Cardiovascular Disease

## 2023-04-30 ENCOUNTER — Encounter (HOSPITAL_COMMUNITY): Payer: Self-pay | Admitting: *Deleted

## 2023-04-30 ENCOUNTER — Observation Stay (HOSPITAL_COMMUNITY)
Admission: EM | Admit: 2023-04-30 | Discharge: 2023-05-01 | Disposition: A | Discharge status: Home / Self Care | Payer: Medicare HMO | Attending: Internal Medicine | Admitting: Internal Medicine

## 2023-04-30 DIAGNOSIS — I2510 Atherosclerotic heart disease of native coronary artery without angina pectoris: Secondary | ICD-10-CM | POA: Diagnosis not present

## 2023-04-30 DIAGNOSIS — Z7984 Long term (current) use of oral hypoglycemic drugs: Secondary | ICD-10-CM | POA: Insufficient documentation

## 2023-04-30 DIAGNOSIS — Z95810 Presence of automatic (implantable) cardiac defibrillator: Secondary | ICD-10-CM | POA: Insufficient documentation

## 2023-04-30 DIAGNOSIS — I5043 Acute on chronic combined systolic (congestive) and diastolic (congestive) heart failure: Secondary | ICD-10-CM | POA: Insufficient documentation

## 2023-04-30 DIAGNOSIS — Z79899 Other long term (current) drug therapy: Secondary | ICD-10-CM | POA: Diagnosis not present

## 2023-04-30 DIAGNOSIS — Z955 Presence of coronary angioplasty implant and graft: Secondary | ICD-10-CM | POA: Diagnosis not present

## 2023-04-30 DIAGNOSIS — R0789 Other chest pain: Secondary | ICD-10-CM | POA: Diagnosis not present

## 2023-04-30 DIAGNOSIS — I110 Hypertensive heart disease with heart failure: Secondary | ICD-10-CM | POA: Insufficient documentation

## 2023-04-30 DIAGNOSIS — E119 Type 2 diabetes mellitus without complications: Secondary | ICD-10-CM | POA: Diagnosis not present

## 2023-04-30 DIAGNOSIS — Z7901 Long term (current) use of anticoagulants: Secondary | ICD-10-CM | POA: Insufficient documentation

## 2023-04-30 DIAGNOSIS — R079 Chest pain, unspecified: Secondary | ICD-10-CM

## 2023-04-30 DIAGNOSIS — Z951 Presence of aortocoronary bypass graft: Secondary | ICD-10-CM | POA: Insufficient documentation

## 2023-04-30 DIAGNOSIS — Z950 Presence of cardiac pacemaker: Secondary | ICD-10-CM | POA: Diagnosis not present

## 2023-04-30 DIAGNOSIS — Z853 Personal history of malignant neoplasm of breast: Secondary | ICD-10-CM | POA: Diagnosis not present

## 2023-04-30 DIAGNOSIS — Z87891 Personal history of nicotine dependence: Secondary | ICD-10-CM | POA: Insufficient documentation

## 2023-04-30 DIAGNOSIS — Z7985 Long-term (current) use of injectable non-insulin antidiabetic drugs: Secondary | ICD-10-CM | POA: Diagnosis not present

## 2023-04-30 DIAGNOSIS — I480 Paroxysmal atrial fibrillation: Secondary | ICD-10-CM | POA: Insufficient documentation

## 2023-04-30 DIAGNOSIS — R0602 Shortness of breath: Secondary | ICD-10-CM | POA: Diagnosis not present

## 2023-04-30 DIAGNOSIS — I5042 Chronic combined systolic (congestive) and diastolic (congestive) heart failure: Secondary | ICD-10-CM | POA: Diagnosis present

## 2023-04-30 NOTE — ED Triage Notes (Signed)
 The pt reports that she was cleaning and started having chest pain approx 2250 with some sob

## 2023-05-01 ENCOUNTER — Observation Stay (HOSPITAL_BASED_OUTPATIENT_CLINIC_OR_DEPARTMENT_OTHER): Payer: Medicare HMO

## 2023-05-01 ENCOUNTER — Encounter (HOSPITAL_COMMUNITY): Payer: Self-pay

## 2023-05-01 ENCOUNTER — Encounter (HOSPITAL_COMMUNITY): Payer: Self-pay | Admitting: Family Medicine

## 2023-05-01 ENCOUNTER — Telehealth: Payer: Self-pay

## 2023-05-01 DIAGNOSIS — I480 Paroxysmal atrial fibrillation: Secondary | ICD-10-CM

## 2023-05-01 DIAGNOSIS — I2510 Atherosclerotic heart disease of native coronary artery without angina pectoris: Secondary | ICD-10-CM | POA: Diagnosis not present

## 2023-05-01 DIAGNOSIS — I5042 Chronic combined systolic (congestive) and diastolic (congestive) heart failure: Secondary | ICD-10-CM

## 2023-05-01 DIAGNOSIS — R079 Chest pain, unspecified: Secondary | ICD-10-CM

## 2023-05-01 DIAGNOSIS — I25810 Atherosclerosis of coronary artery bypass graft(s) without angina pectoris: Secondary | ICD-10-CM | POA: Diagnosis not present

## 2023-05-01 LAB — BASIC METABOLIC PANEL
Anion gap: 12 (ref 5–15)
BUN: 21 mg/dL (ref 8–23)
CO2: 24 mmol/L (ref 22–32)
Calcium: 9.4 mg/dL (ref 8.9–10.3)
Chloride: 99 mmol/L (ref 98–111)
Creatinine, Ser: 0.95 mg/dL (ref 0.44–1.00)
GFR, Estimated: >60 mL/min (ref >60)
Glucose, Bld: 180 mg/dL — ABNORMAL HIGH (ref 70–99)
Potassium: 4.1 mmol/L (ref 3.5–5.1)
Sodium: 135 mmol/L (ref 135–145)

## 2023-05-01 LAB — ECHOCARDIOGRAM COMPLETE
AR max vel: 2.36 cm2
AV Area VTI: 2.54 cm2
AV Area mean vel: 2.38 cm2
AV Mean grad: 2 mm[Hg]
AV Peak grad: 3.8 mm[Hg]
Ao pk vel: 0.97 m/s
Area-P 1/2: 5.31 cm2
Calc EF: 54.8 %
Height: 64 in
S' Lateral: 3.8 cm
Single Plane A2C EF: 58.8 %
Single Plane A4C EF: 47.2 %
Weight: 2412.71 [oz_av]

## 2023-05-01 LAB — CBC
HCT: 39.8 % (ref 36.0–46.0)
Hemoglobin: 13.2 g/dL (ref 12.0–15.0)
MCH: 31.2 pg (ref 26.0–34.0)
MCHC: 33.2 g/dL (ref 30.0–36.0)
MCV: 94.1 fL (ref 80.0–100.0)
Platelets: 220 10*3/uL (ref 150–400)
RBC: 4.23 MIL/uL (ref 3.87–5.11)
RDW: 12.3 % (ref 11.5–15.5)
WBC: 6.6 10*3/uL (ref 4.0–10.5)
nRBC: 0 % (ref 0.0–0.2)

## 2023-05-01 LAB — CBG MONITORING, ED
Glucose-Capillary: 137 mg/dL — ABNORMAL HIGH (ref 70–99)
Glucose-Capillary: 148 mg/dL — ABNORMAL HIGH (ref 70–99)

## 2023-05-01 LAB — TROPONIN I (HIGH SENSITIVITY)
Troponin I (High Sensitivity): 10 ng/L (ref <18)
Troponin I (High Sensitivity): 10 ng/L (ref <18)
Troponin I (High Sensitivity): 8 ng/L (ref <18)

## 2023-05-01 MED ORDER — FUROSEMIDE 20 MG PO TABS
20.0000 mg | ORAL_TABLET | Freq: Every day | ORAL | Status: DC
  Administered 2023-05-01: 20 mg via ORAL
  Filled 2023-05-01: qty 1

## 2023-05-01 MED ORDER — RIVAROXABAN 10 MG PO TABS: 20.0000 mg | ORAL_TABLET | Freq: Every day | ORAL | Status: DC

## 2023-05-01 MED ORDER — PERFLUTREN LIPID MICROSPHERE
1.0000 mL | INTRAVENOUS | Status: AC | PRN
  Administered 2023-05-01: 3 mL via INTRAVENOUS

## 2023-05-01 MED ORDER — CARVEDILOL 12.5 MG PO TABS: 25.0000 mg | ORAL_TABLET | Freq: Two times a day (BID) | ORAL | Status: DC

## 2023-05-01 MED ORDER — RIVAROXABAN 10 MG PO TABS
20.0000 mg | ORAL_TABLET | Freq: Every day | ORAL | Status: DC
Start: 2023-05-01 — End: 2023-05-01

## 2023-05-01 MED ORDER — ACETAMINOPHEN 325 MG PO TABS
650.0000 mg | ORAL_TABLET | Freq: Once | ORAL | Status: AC
  Administered 2023-05-01: 650 mg via ORAL
  Filled 2023-05-01: qty 2

## 2023-05-01 MED ORDER — ISOSORBIDE MONONITRATE ER 30 MG PO TB24
60.0000 mg | ORAL_TABLET | Freq: Every day | ORAL | Status: DC
  Administered 2023-05-01: 60 mg via ORAL
  Filled 2023-05-01: qty 2

## 2023-05-01 MED ORDER — LOSARTAN POTASSIUM 50 MG PO TABS
25.0000 mg | ORAL_TABLET | Freq: Two times a day (BID) | ORAL | Status: DC
  Administered 2023-05-01: 25 mg via ORAL
  Filled 2023-05-01: qty 1

## 2023-05-01 MED ORDER — NITROGLYCERIN 0.4 MG SL SUBL: 0.4000 mg | SUBLINGUAL_TABLET | SUBLINGUAL | Status: DC | PRN

## 2023-05-01 MED ORDER — INSULIN ASPART 100 UNIT/ML IJ SOLN: 0.0000 [IU] | INTRAMUSCULAR | Status: DC

## 2023-05-01 MED ORDER — ACETAMINOPHEN 325 MG PO TABS: 650.0000 mg | ORAL_TABLET | ORAL | Status: DC | PRN

## 2023-05-01 MED ORDER — OXYCODONE HCL 5 MG PO TABS: 5.0000 mg | ORAL_TABLET | ORAL | Status: DC | PRN

## 2023-05-01 MED ORDER — ONDANSETRON HCL 4 MG/2ML IJ SOLN: 4.0000 mg | Freq: Four times a day (QID) | INTRAMUSCULAR | Status: DC | PRN

## 2023-05-01 NOTE — ED Provider Notes (Signed)
 [facility] AT [facility] Provider Note   CSN: [id number] Arrival date & time: 04/30/23  2324     History  Chief Complaint  Patient presents with   Chest Pain    [name] is a 73 y.o. female   Pt complains of chest pain with radiation to neck, some shob starting at 2250. Improved after 2 nitroglycerin. Hx of ACS, cabg. Pacemaker in place for ~10 years. No pain at time of my eval. Reports she was cleaning when pain started. No missed doses of home medications.  Reports all of her chest pain is resolved but she is still having some neck pain.  She reports she normally does not have to use her nitro at all, she has not seen her cardiologist in around 6 months.   Chest Pain      Home Medications Prior to Admission medications   Medication Sig Start Date End Date Taking? Authorizing Provider  APPLE CIDER VINEGAR PO Take 5,220 mg by mouth in the morning, at noon, and at bedtime. 1740 mg each    [provider]  Blood Glucose Monitoring Suppl (TRUE METRIX AIR GLUCOSE METER) DEVI Check blood sugar 2 times a day 08/10/19   Carlus Pavlov, MD  carvedilol (COREG) 12.5 MG tablet Take 2 tablets by Mouth Twice a day. 06/13/22   Croitoru, Mihai, MD  Cholecalciferol (VITAMIN D3) 50 MCG (2000 UT) capsule TAKE 1 CAPSULE (2000 UNITS TOTAL) BY MOUTH DAILY. 09/11/22   Philip Aspen, Limmie Patricia, MD  cyclobenzaprine (FLEXERIL) 5 MG tablet Take 1 tablet (5 mg total) by mouth at bedtime. 02/20/23   Philip Aspen, Limmie Patricia, MD  Dulaglutide (TRULICITY) 1.5 MG/0.5ML SOPN INJECT 1.5MG  (1 PEN) UNDER THE SKIN EVERY 7 DAYS 10/30/22   Philip Aspen, Limmie Patricia, MD  furosemide (LASIX) 20 MG tablet TAKE 1 TABLET EVERY DAY 11/13/22   Croitoru, Mihai, MD  glipiZIDE (GLUCOTROL) 5 MG tablet TAKE 1 TABLET TWICE DAILY BEFORE MEALS 11/13/22   Philip Aspen, Limmie Patricia, MD  glucose blood (TRUE METRIX BLOOD GLUCOSE TEST) test strip Use to check blood sugar 2 times a day. 08/10/19    Carlus Pavlov, MD  isosorbide mononitrate (IMDUR) 60 MG 24 hr tablet TAKE 1 TABLET EVERY DAY 11/13/22   Croitoru, Mihai, MD  JARDIANCE 25 MG TABS tablet TAKE 1 TABLET EVERY DAY BEFORE BREAKFAST 11/13/22   Philip Aspen, Limmie Patricia, MD  losartan (COZAAR) 25 MG tablet TAKE 1 TABLET TWICE DAILY 09/05/22   Croitoru, Mihai, MD  metFORMIN (GLUCOPHAGE) 1000 MG tablet TAKE 1 TABLET TWICE DAILY WITH FOOD 11/13/22   Philip Aspen, Limmie Patricia, MD  nitroGLYCERIN (NITROSTAT) 0.4 MG SL tablet Place 1 tablet (0.4 mg total) under the tongue every 5 (five) minutes as needed for chest pain. 04/19/19   Croitoru, Mihai, MD  REPATHA SURECLICK 140 MG/ML SOAJ INJECT 140 MG INTO THE SKIN EVERY 14 (FOURTEEN) DAYS. 11/13/22   Croitoru, Mihai, MD  rivaroxaban (XARELTO) 20 MG TABS tablet TAKE 1 TABLET EVERY DAY WITH SUPPER 01/22/23   Croitoru, Mihai, MD  TRUEplus Lancets 33G MISC Use to check blood sugar 2 times a day. 08/10/19   Carlus Pavlov, MD      Allergies    Statins    Review of Systems   Review of Systems  Cardiovascular:  Positive for chest pain.  All other systems reviewed and are negative.   Physical Exam Updated Vital Signs BP (!) 152/69   Pulse 86   Temp 97.8 F (  36.6 C)   Resp (!) 26   Ht 5\' 4"  (1.626 m)   Wt 68.4 kg   SpO2 100%   BMI 25.88 kg/m  Physical Exam Vitals and nursing note reviewed.  Constitutional:      General: She is not in acute distress.    Appearance: Normal appearance.  HENT:     Head: Normocephalic and atraumatic.  Eyes:     General:        Right eye: No discharge.        Left eye: No discharge.  Cardiovascular:     Rate and Rhythm: Normal rate and regular rhythm.     Heart sounds: No murmur heard.    No friction rub. No gallop.  Pulmonary:     Effort: Pulmonary effort is normal.     Breath sounds: Normal breath sounds.  Abdominal:     General: Bowel sounds are normal.     Palpations: Abdomen is soft.  Skin:    General: Skin is warm and dry.     Capillary  Refill: Capillary refill takes less than 2 seconds.  Neurological:     Mental Status: She is alert and oriented to person, place, and time.  Psychiatric:        Mood and Affect: Mood normal.        Behavior: Behavior normal.     ED Results / Procedures / Treatments   Labs (all labs ordered are listed, but only abnormal results are displayed) Labs Reviewed  BASIC METABOLIC PANEL - Abnormal; Notable for the following components:      Result Value   Glucose, Bld 180 (*)    All other components within normal limits  CBC  TROPONIN I (HIGH SENSITIVITY)  TROPONIN I (HIGH SENSITIVITY)    EKG EKG Interpretation Date/Time:  Wednesday April 30 2023 23:36:22 EST Ventricular Rate:  97 PR Interval:  152 QRS Duration:  142 QT Interval:  438 QTC Calculation: 556 R Axis:   -37  Text Interpretation: Atrial-sensed ventricular-paced rhythm Abnormal ECG Confirmed by Tilden Fossa [phone]) on 04/30/2023 11:49:03 PM  Radiology DG Chest 2 View Result Date: 04/30/2023 CLINICAL DATA:  Chest pain, shortness of breath EXAM: CHEST - 2 VIEW COMPARISON:  10/12/2021 FINDINGS: Lungs are clear.  No pleural effusion or pneumothorax. The heart is normal in size. Postsurgical changes related to prior CABG. Atrial closure device. Left subclavian pacemaker. Surgical clips along the bilateral chest wall. Median sternotomy.  Thoracic spine is within normal limits. IMPRESSION: Normal chest radiographs. Postsurgical changes, as above. Electronically Signed   By: Charline Bills M.D.   On: 04/30/2023 23:51    Procedures Procedures    Medications Ordered in ED Medications  acetaminophen (TYLENOL) tablet 650 mg (650 mg Oral Given 05/01/23 0100)    ED Course/ Medical Decision Making/ A&P Clinical Course as of 05/01/23 0316  Thu May 01, 2023  0301 Brayton Layman [CP]    Clinical Course User Index [CP] Olene Floss, PA-C             HEART Score: 6                    Medical Decision  Making Risk OTC drugs.   This patient is a 73 y.o. female  who presents to the ED for concern of chest pain, shob.   Differential diagnoses prior to evaluation: The emergent differential diagnosis includes, but is not limited to,  ACS, AAS, PE, Mallory-Weiss, Boerhaave's, Pneumonia, acute bronchitis, asthma or  COPD exacerbation, anxiety, MSK pain or traumatic injury to the chest, acid reflux versus other . This is not an exhaustive differential.   Past Medical History / Co-morbidities / Social History: Hypertension, hyperlipidemia, CABG, CHF, diabetes  Additional history: Chart reviewed. Pertinent results include: Reviewed outpatient echoes, remote pacemaker checks  Physical Exam: Physical exam performed. The pertinent findings include: Somewhat hypertensive on arrival, blood pressure 158/89.  Vital signs otherwise stable.  Mild tachypnea on arrival improved on reevaluation after rest.  Lab Tests/Imaging studies:  I personally interpreted labs/imaging and the pertinent results include:  CBC unremarkable, BMP with mildly elevated glucose at 180.  Troponin initially at 8, we will obtain delta given the onset of her symptoms just prior to arrival..  I independently interpreted plain film chest x-ray which shows no evidence of acute intrathoracic abnormality.  I agree with the radiologist interpretation.  Delta troponin at 10  Cardiac monitoring: EKG obtained and interpreted by myself and attending physician which shows: Atrial pacemaker rhythm, no acute ST-T changes compared to recent previous   Medications: I ordered medication including Tylenol for pain.  I have reviewed the patients home medicines and have made adjustments as needed.  Consults: I spoke with the cardiologist, Dr. Lily Peer, who recommends admission for echo, stress test, recommends medicine admit. Admit to hospitalist, Dr. Antionette Char.   Disposition: After consideration of the diagnostic results and the patients response to  treatment, I feel that patient would benefit from admission for high risk chest pain as discussed above .   Final Clinical Impression(s) / ED Diagnoses Final diagnoses:  Chest pain, unspecified type    Rx / DC Orders ED Discharge Orders     None         [place] 05/01/23 4782    Glynn Octave, MD 05/01/23 0600

## 2023-05-01 NOTE — Progress Notes (Signed)
 Echocardiogram 2D Echocardiogram has been performed.  Reinaldo Raddle Hensley Treat 05/01/2023, 9:18 AM

## 2023-05-01 NOTE — Consult Note (Signed)
 Cardiology Consultation   Patient ID: [name] MRN: [id number]; DOB: 21-Jul-1950  Admit date: 04/30/2023 Date of Consult: 05/01/2023  PCP:  [name], Michelle Patricia, MD   [facility] Providers Cardiologist:  Michelle Fair, MD        Patient Profile:   [name] is a 73 y.o. female with a hx of CAD s/p CABG 2014, paroxysmal atrial fibrillation, CHF with recovery of EF after CRT-P (St. Jude), type 2 diabetes mellitus, hypercholesterolemia, hypertension  who is being seen 05/01/2023 for the evaluation of chest pain at the request of Dr [name].  History of Present Illness:   [name] is a 73 y.o. female with a hx of CAD s/p CABG 2014, paroxysmal atrial fibrillation, CHF with recovery of EF after CRT-P ([employer]. [employer]), type 2 diabetes mellitus, hypercholesterolemia, hypertension  who is being seen 05/01/2023 for the evaluation of chest pain   Patient last seen cardiologist on 07/08/2022, device interrogation normal, 100% BiV pacing, last stress test was 2021, EF 51%, small defect of mild severity in the apex, small defect in basal inferolateral location, low risk stress test SDS of 4, she was medically managed with nitrates  Patient reports she was cleaning and started having chest pain with shortness of breath, took 2 nitroglycerin with complete resolution of chest pain.  Chest pain radiated to neck, bilateral arms, lasting for 40 minutes  she has also been experiencing fatigue and exertional dyspnea since 3 months, but no exertional chest pain, no heart failure symptoms, no dizziness.  She reports she might have seen some A-fib on her monitor but did not have any palpitations or syncope.  When she came to the ER her chest pain was resolved, troponin x 2 negative, EKG shows paced rhythm, chest x-ray negative Blood pressure was high 158/89 mmHg  Prior cardiac workup. Echo 5//2023 IMPRESSIONS     1. Left ventricular ejection fraction, by estimation, is 55 to 60%. The   left ventricle has normal function. The left ventricle has no regional  wall motion abnormalities. Left ventricular diastolic parameters are  indeterminate. The average left  ventricular global longitudinal strain is -18.3 %. The global longitudinal  strain is normal.   2. Right ventricular systolic function is normal. The right ventricular  size is normal. There is normal pulmonary artery systolic pressure. The  estimated right ventricular systolic pressure is 22.0 mmHg.   3. The mitral valve is degenerative. Trivial mitral valve regurgitation.  No evidence of mitral stenosis.   4. The aortic valve is tricuspid. Aortic valve regurgitation is not  visualized. Aortic valve sclerosis is present, with no evidence of aortic  valve stenosis.   5. The inferior vena cava is normal in size with greater than 50%  respiratory variability, suggesting right atrial pressure of 3 mmHg.   Stress test 2021 The left ventricular ejection fraction is mildly decreased (45-54%). Nuclear stress EF: 51%. There was no ST segment deviation noted during stress. No T wave inversion was noted during stress. Defect 1: There is a small defect of mild severity present in the apex location. Defect 2: There is a small defect of mild severity present in the basal inferolateral location. This is a low risk study. Findings consistent with prior myocardial infarction.   Low risk nuclear scan with a small apical scar (previously described) and a possibly new small area of mild inferolateral ischemia (sum difference 4%) and borderline reduction in left ventricular systolic function (EF51%).  Past Medical History:  Diagnosis Date  Acute on chronic combined systolic and diastolic CHF, NYHA class 3 (HCC) 03/19/2013   AICD (automatic cardioverter/defibrillator) present 01/2013   Biventricular cardiac pacemaker in situ    Allergic rhinitis    Anogenital warts 01/22/2011   Anxiety state, unspecified 05/13/2013   BREAST  CANCER, HX OF 09/19/2009   Qualifier: Diagnosis of  By: Tawanna Cooler MD, Tinnie Gens A    CAD (coronary artery disease)    a. 2004: s/p MI in [place]. No PCI->Medical RX;  b. 07/2012 Cath: LM 30-40, LAD 70p, 70/64m, D1 80-90p, OM1 small 90p, OM2 large 80-90p, 2m, 70-80d, RCA 20-30 diff, EF 40%, glob HK.s/p CABG   Cancer of left breast (HCC) 2002   Patient reports left breast cancer diagnosis in 2002 treated with bilateral mastectomy positive lymph nodes with left axillary dissection followed by chemotherapy of unknown type   Cataract    Chronic combined systolic and diastolic CHF, NYHA class 2 (HCC) 01/08/2013   Diabetes mellitus type II    Exertional dyspnea 08/05/2014   Exertional shortness of breath    Generalized osteoarthrosis, involving multiple sites 08/25/2015   History of colon polyps    Hyperlipidemia    Hypertension    Incisional hernia, without obstruction or gangrene 05/04/2015   Ischemic cardiomyopathy    a. 07/2012 Echo: EF 35%, Sev inferoseptal HK, mildly dil LA, Peak PASP .   LBBB (left bundle branch block)    a. intermittent - present during rapid afib 07/2012.   MYOCARDIAL INFARCTION, HX OF 09/19/2009   Qualifier: Diagnosis of  By: Tawanna Cooler MD, Eugenio Hoes    Neuromuscular disorder [facility])    Patient reports chronic numbness in the right foot related to previous surgery on the right leg and "nerve damage"   PAF (paroxysmal atrial fibrillation) (HCC)    a. 07/2012: Amio and xarelto initiated.   Right carotid bruit 08/11/2012   SBO (small bowel obstruction) (HCC)    SOB (shortness of breath) 02/26/2018   Type 2 diabetes mellitus with circulatory disorder, without long-term current use of insulin (HCC) 10/03/2015   Vitamin D deficiency 02/22/2016    Past Surgical History:  Procedure Laterality Date   ABDOMINAL HYSTERECTOMY  2000   APPENDECTOMY  1974   BI-VENTRICULAR PACEMAKER INSERTION N/A 01/25/2013   Procedure: BI-VENTRICULAR PACEMAKER INSERTION (CRT-P);  Surgeon: Marinus Maw, MD;  Location: [facility] CATH LAB;  Service: Cardiovascular;  Laterality: N/A;   BREAST BIOPSY Left 2002   CARDIAC CATHETERIZATION     CHOLECYSTECTOMY OPEN  1974   CORONARY ARTERY BYPASS GRAFT N/A 09/28/2012   Procedure: CORONARY ARTERY BYPASS GRAFTING (CABG);  Surgeon: Delight Ovens, MD;  Location: [facility] OR;  Service: Open Heart Surgery;  Laterality: N/A;  CABG x four, using left internal mammary artery and left leg greater saphenous vein harvested endoscopically   DILATION AND CURETTAGE OF UTERUS  1983   EPICARDIAL PACING LEAD PLACEMENT N/A 09/28/2012   Procedure: EPICARDIAL PACING LEAD PLACEMENT;  Surgeon: Delight Ovens, MD;  Location: [facility] OR;  Service: Thoracic;  Laterality: N/A;  LV LEAD PLACEMENT   HERNIA REPAIR     INCISIONAL HERNIA REPAIR N/A 05/25/2015   Procedure: LAPAROSCOPIC INCISIONAL HERNIA WITH MESH ;  Surgeon: Emelia Loron, MD;  Location: [facility] OR;  Service: General;  Laterality: N/A;   INCONTINENCE SURGERY  2000   INSERTION OF MESH N/A 05/25/2015   Procedure: INSERTION OF MESH;  Surgeon: Emelia Loron, MD;  Location: [facility] OR;  Service: General;  Laterality: N/A;   INTRAOPERATIVE TRANSESOPHAGEAL ECHOCARDIOGRAM N/A  09/28/2012   Procedure: INTRAOPERATIVE TRANSESOPHAGEAL ECHOCARDIOGRAM;  Surgeon: Delight Ovens, MD;  Location: [facility] OR;  Service: Open Heart Surgery;  Laterality: N/A;   LAPAROSCOPIC INCISIONAL / UMBILICAL / VENTRAL HERNIA REPAIR  05/25/2015   IHR   LEFT HEART CATHETERIZATION WITH CORONARY ANGIOGRAM N/A 07/20/2012   Procedure: LEFT HEART CATHETERIZATION WITH CORONARY ANGIOGRAM;  Surgeon: Peter M Swaziland, MD;  Location: [facility] CATH LAB;  Service: Cardiovascular;  Laterality: N/A;   MASTECTOMY Right 2002   MASTECTOMY MODIFIED RADICAL W/ AXILLARY LYMPH NODES W/ OR W/O PECTORALIS MINOR Left 2002   MAZE N/A 09/28/2012   Procedure: MAZE;  Surgeon: Delight Ovens, MD;  Location: [facility] OR;  Service: Open Heart Surgery;  Laterality: N/A;   ORIF WRIST FRACTURE Right 11/08/2017    Procedure: OPEN REDUCTION INTERNAL FIXATION (ORIF) WRIST FRACTURE;  Surgeon: Dominica Severin, MD;  Location: [facility] OR;  Service: Orthopedics;  Laterality: Right;  90 mins   PPM GENERATOR CHANGEOUT N/A 04/17/2020   Procedure: PPM GENERATOR CHANGEOUT;  Surgeon: Michelle Fair, MD;  Location: [facility] INVASIVE CV LAB;  Service: Cardiovascular;  Laterality: N/A;   TENDON REPAIR Right 2001 X 3-4   torn ligaments and tendons in ankle up to knee from work related accident   TUBAL LIGATION  1987     Home Medications:  Prior to Admission medications   Medication Sig Start Date End Date Taking? Authorizing Provider  acetaminophen (TYLENOL) 325 MG tablet Take 325 mg by mouth as needed for mild pain (pain score 1-3) (low back, hip, and upper leg pain).   Yes [provider]  APPLE CIDER VINEGAR PO Take 5,220 mg by mouth in the morning, at noon, and at bedtime. 1740 mg each   Yes [provider]  carvedilol (COREG) 12.5 MG tablet Take 2 tablets by Mouth Twice a day. Patient taking differently: Take 25 mg by mouth 2 (two) times daily with a meal. Take 2 tablets by Mouth Twice a day. 06/13/22  Yes Croitoru, Mihai, MD  Cholecalciferol (VITAMIN D3) 50 MCG (2000 UT) capsule TAKE 1 CAPSULE (2000 UNITS TOTAL) BY MOUTH DAILY. 09/11/22  Yes [name], Michelle Patricia, MD  Cyanocobalamin 2000 MCG/ML SOLN Inject 1,000 mg as directed every 30 (thirty) days. Inject on the 21st of each  month   Yes [provider]  Dulaglutide (TRULICITY) 1.5 MG/0.5ML SOPN INJECT 1.5MG  (1 PEN) UNDER THE SKIN EVERY 7 DAYS 10/30/22  Yes [name], Michelle Patricia, MD  furosemide (LASIX) 20 MG tablet TAKE 1 TABLET EVERY DAY 11/13/22  Yes Croitoru, Mihai, MD  glipiZIDE (GLUCOTROL) 5 MG tablet TAKE 1 TABLET TWICE DAILY BEFORE MEALS 11/13/22  Yes [name], Michelle Patricia, MD  ibuprofen (ADVIL) 200 MG tablet Take 400 mg by mouth every 6 (six) hours as needed for mild pain (pain score 1-3) or moderate pain (pain score 4-6) (low back,  hip, and upper leg).   Yes [provider]  isosorbide mononitrate (IMDUR) 60 MG 24 hr tablet TAKE 1 TABLET EVERY DAY 11/13/22  Yes Croitoru, Mihai, MD  JARDIANCE 25 MG TABS tablet TAKE 1 TABLET EVERY DAY BEFORE BREAKFAST 11/13/22  Yes [name], Michelle Patricia, MD  losartan (COZAAR) 25 MG tablet TAKE 1 TABLET TWICE DAILY 09/05/22  Yes Croitoru, Mihai, MD  metFORMIN (GLUCOPHAGE) 1000 MG tablet TAKE 1 TABLET TWICE DAILY WITH FOOD 11/13/22  Yes [name], Michelle Patricia, MD  nitroGLYCERIN (NITROSTAT) 0.4 MG SL tablet Place 1 tablet (0.4 mg total) under the tongue every 5 (five) minutes  as needed for chest pain. 04/19/19  Yes Croitoru, Mihai, MD  REPATHA SURECLICK 140 MG/ML SOAJ INJECT 140 MG INTO THE SKIN EVERY 14 (FOURTEEN) DAYS. 11/13/22  Yes Croitoru, Mihai, MD  rivaroxaban (XARELTO) 20 MG TABS tablet TAKE 1 TABLET EVERY DAY WITH SUPPER 01/22/23  Yes Croitoru, Mihai, MD    Inpatient Medications: Scheduled Meds:  carvedilol  25 mg Oral BID WC   furosemide  20 mg Oral Daily   insulin aspart  0-6 Units Subcutaneous Q4H   isosorbide mononitrate  60 mg Oral Daily   losartan  25 mg Oral BID   rivaroxaban  20 mg Oral Q supper   Continuous Infusions:  PRN Meds: acetaminophen, nitroGLYCERIN, ondansetron (ZOFRAN) IV, oxyCODONE  Allergies:    Allergies  Allergen Reactions   Statins Itching    Myalgias with rosuvastatin, atorvastatin and simvastatin     Social History:   Social History   Socioeconomic History   Marital status: Married    Spouse name: Not on file   Number of children: 3   Years of education: Not on file   Highest education level: GED or equivalent  Occupational History   Occupation: Paramedic work    Associate Professor: [employer]  Tobacco Use   Smoking status: Former    Current packs/day: 0.00    Average packs/day: 1 pack/day for 10.0 years (10.0 ttl pk-yrs)    Types: Cigarettes    Start date: 03/11/2002    Quit date: 03/11/2012    Years since quitting: 11.1    Smokeless tobacco: Never  Vaping Use   Vaping status: Never Used  Substance and Sexual Activity   Alcohol use: No   Drug use: No   Sexual activity: Yes    Partners: Male    Birth control/protection: Surgical  Other Topics Concern   Not on file  Social History Narrative   Lives with husband and mother in [place].  Regular exercise: walking. Caffeine use: 2 cups of coffee in the morning (on weekends).       Working: PT (school (afterschool).    Social Drivers of Corporate investment banker Strain: Low Risk  (03/31/2023)   Overall Financial Resource Strain (CARDIA)    Difficulty of Paying Living Expenses: Not very hard  Recent Concern: Financial Resource Strain - Medium Risk (01/24/2023)   Overall Financial Resource Strain (CARDIA)    Difficulty of Paying Living Expenses: Somewhat hard  Food Insecurity: Food Insecurity Present (03/31/2023)   Hunger Vital Sign    Worried About Running Out of Food in the Last Year: Sometimes true    Ran Out of Food in the Last Year: Never true  Transportation Needs: No Transportation Needs (03/31/2023)   PRAPARE - Administrator, Civil Service (Medical): No    Lack of Transportation (Non-Medical): No  Physical Activity: Inactive (03/31/2023)   Exercise Vital Sign    Days of Exercise per Week: 0 days    Minutes of Exercise per Session: 30 min  Stress: Stress Concern Present (03/31/2023)   [employer] of Occupational Health - Occupational Stress Questionnaire    Feeling of Stress : To some extent  Social Connections: Socially Integrated (03/31/2023)   Social Connection and Isolation Panel [NHANES]    Frequency of Communication with Friends and Family: More than three times a week    Frequency of Social Gatherings with Friends and Family: More than three times a week    Attends Religious Services: More than 4 times per year  Active Member of Clubs or Organizations: Yes    Attends Engineer, structural: More than 4 times per year     Marital Status: Married  Catering manager Violence: Not on file    Family History:    Family History  Problem Relation Age of Onset   Kidney failure Mother    Heart disease Mother        Died mid 15s complications of diabetes   Diabetes Mother    Colon cancer Sister    Arthritis Other    Diabetes Other    Hyperlipidemia Other    Ovarian cancer Other    Liver cancer Brother    Stomach cancer Neg Hx    Rectal cancer Neg Hx    Esophageal cancer Neg Hx    Pancreatic cancer Neg Hx      ROS:  Please see the history of present illness.   All other ROS reviewed and negative.     Physical Exam/Data:   Vitals:   05/01/23 0145 05/01/23 0200 05/01/23 0215 05/01/23 0230  BP:      Pulse: 83 80 82 86  Resp: 16 17 16  (!) 26  Temp:      SpO2: 97% 100% 100% 100%  Weight:      Height:       No intake or output data in the 24 hours ending 05/01/23 0502    04/30/2023   11:32 PM 01/28/2023    8:24 AM 10/07/2022    4:36 PM  Last 3 Weights  Weight (lbs) 150 lb 12.7 oz 150 lb 12.8 oz 152 lb 9.6 oz  Weight (kg) 68.4 kg 68.402 kg 69.219 kg     Body mass index is 25.88 kg/m.  General:  Well nourished, well developed, in no acute distress HEENT: normal Neck: no JVD Vascular: No carotid bruits; Distal pulses 2+ bilaterally Cardiac:  normal S1, S2; RRR; no murmur  Lungs:  clear to auscultation bilaterally, no wheezing, rhonchi or rales  Abd: soft, nontender, no hepatomegaly  Ext: no edema Musculoskeletal:  No deformities, BUE and BLE strength normal and equal Skin: warm and dry  Neuro:  CNs 2-12 intact, no focal abnormalities noted Psych:  Normal affect   EKG:  The EKG was personally reviewed and demonstrates: V paced rhythm Telemetry:  Telemetry was personally reviewed and demonstrates:    Relevant CV Studies: As above  Laboratory Data:  High Sensitivity Troponin:   Recent Labs  Lab 04/30/23 2345 05/01/23 0150  TROPONINIHS 8 10     Chemistry Recent Labs  Lab  04/30/23 2345  NA 135  K 4.1  CL 99  CO2 24  GLUCOSE 180*  BUN 21  CREATININE 0.95  CALCIUM 9.4  GFRNONAA >60  ANIONGAP 12    No results for input(s): "PROT", "ALBUMIN", "AST", "ALT", "ALKPHOS", "BILITOT" in the last 168 hours. Lipids No results for input(s): "CHOL", "TRIG", "HDL", "LABVLDL", "LDLCALC", "CHOLHDL" in the last 168 hours.  Hematology Recent Labs  Lab 04/30/23 2345  WBC 6.6  RBC 4.23  HGB 13.2  HCT 39.8  MCV 94.1  MCH 31.2  MCHC 33.2  RDW 12.3  PLT 220   Thyroid No results for input(s): "TSH", "FREET4" in the last 168 hours.  BNPNo results for input(s): "BNP", "PROBNP" in the last 168 hours.  DDimer No results for input(s): "DDIMER" in the last 168 hours.   Radiology/Studies:  DG Chest 2 View Result Date: 04/30/2023 CLINICAL DATA:  Chest pain, shortness of breath EXAM: CHEST -  2 VIEW COMPARISON:  10/12/2021 FINDINGS: Lungs are clear.  No pleural effusion or pneumothorax. The heart is normal in size. Postsurgical changes related to prior CABG. Atrial closure device. Left subclavian pacemaker. Surgical clips along the bilateral chest wall. Median sternotomy.  Thoracic spine is within normal limits. IMPRESSION: Normal chest radiographs. Postsurgical changes, as above. Electronically Signed   By: Charline Bills M.D.   On: 04/30/2023 23:51     Assessment and Plan:   Chest pain CAD s/p CABG 2014 Paroxysmal A-fib on Xarelto. Heart failure with improved EF, most recent EF 55 to 60%, previously prior to CRT-P was 35 to 40%. Complete heart block status post CRT-P, 100% V paced. Multiple other comorbidities include diabetes, hypertension, hyperlipidemia, chronic fatigue  Plan: -Patient is admitted under medicine team, cardiology consulted -Currently patient is chest pain-free and hemodynamically stable troponins are negative EKG is V paced.  Will Keep the patient n.p.o., hold Coreg, continue home medication of Lasix 20 mg daily, she is euvolemic, continue  antianginal therapy isosorbide 60 mg daily, losartan 25 mg twice daily,. N.p.o. for today for consideration of nuclear stress test. Check her pacemaker for any atrial arrhythmia/recurrence of A-fib Given paroxysmal A-fib , hold Xarelto for now, if stress test is abnormal then need to hold this for further procedures Continue home medication, repeat echo and trend troponin and EKG this morning  Will follow along Risk Assessment/Risk Scores:          CHA2DS2-VASc Score =   4  This indicates a  % annual risk of stroke. The patient's score is based upon:          For questions or updates, please contact [facility] Please consult [email or web address] for contact info under    Signed, Elmon Kirschner, MD  05/01/2023 5:02 AM

## 2023-05-01 NOTE — ED Notes (Signed)
 The pt has a pacemaker and is on thinners

## 2023-05-01 NOTE — Discharge Summary (Signed)
 Physician Discharge Summary  [name] ZOX:[id number] DOB: 29-Dec-1950 DOA: 04/30/2023  PCP: Philip Aspen, Limmie Patricia, MD  Admit date: 04/30/2023 Discharge date: 05/01/2023  Admitted From: Home Disposition:  Home   Recommendations for Outpatient Follow-up:  Follow up with PCP  Follow up with cardiology as scheduled for outpatient stress test  Discharge Condition: Stable CODE STATUS: Full code Diet recommendation: Heart healthy  Brief/Interim Summary: From H&P by Dr. Minerva Areola: "[name] is a 73 y.o. female with medical history significant for hypertension, type 2 diabetes mellitus, hyperlipidemia, CAD status post CABG in 2014, atrial fibrillation on Xarelto, complete heart block with pacer, statin-intolerance, and chronic combined systolic and diastolic CHF (EF has normalized with CRT-P) who presents for evaluation of chest pain.   Patient has been experiencing increased fatigue and exertional dyspnea for roughly 3 months now but was not having chest discomfort until last night.  She was washing dishes last night when she developed an intense pressure sensation in her chest with discomfort in her neck and bilateral arms.  She took 2 nitroglycerin at home with improvement and the chest pressure eventually resolved prior to arrival in the ED.  It lasted roughly 40 minutes.  She denies recent cough, leg swelling, fevers, or chills.   ED Course: Upon arrival to the ED, patient is found to be afebrile with normal heart rate and stable blood pressure.  Labs are most notable for normal troponin x 2, normal WBC, and creatinine 0.95.  EKG demonstrates a paced rhythm and no acute findings noted on chest x-ray.   Cardiology was consulted by the ED PA and hospitalist admission was recommended."   Patient's troponin remained negative.  Echocardiogram was completed and cardiology evaluated patient, will arrange for outpatient follow-up and workup.  Patient remained chest pain-free and was discharged  home in stable condition.  Discharge Diagnoses:   Principal Problem:   Chest pain Active Problems:   CAD (coronary artery disease)   PAF (paroxysmal atrial fibrillation) (HCC)   Chronic combined systolic and diastolic CHF, NYHA class 2 (HCC)   Discharge Instructions  Discharge Instructions     Call MD for:  difficulty breathing, headache or visual disturbances   Complete by: As directed    Call MD for:  extreme fatigue   Complete by: As directed    Call MD for:  persistant dizziness or light-headedness   Complete by: As directed    Call MD for:  persistant nausea and vomiting   Complete by: As directed    Call MD for:  severe uncontrolled pain   Complete by: As directed    Call MD for:  temperature >100.4   Complete by: As directed    Diet - low sodium heart healthy   Complete by: As directed    Discharge instructions   Complete by: As directed    You were cared for by a hospitalist during your hospital stay. If you have any questions about your discharge medications or the care you received while you were in the hospital after you are discharged, you can call the unit and ask to speak with the hospitalist on call if the hospitalist that took care of you is not available. Once you are discharged, your primary care physician will handle any further medical issues. Please note that NO REFILLS for any discharge medications will be authorized once you are discharged, as it is imperative that you return to your primary care physician (or establish a relationship with a primary care physician if  you do not have one) for your aftercare needs so that they can reassess your need for medications and monitor your lab values.   Increase activity slowly   Complete by: As directed       Allergies as of 05/01/2023       Reactions   Statins Itching   Myalgias with rosuvastatin, atorvastatin and simvastatin         Medication List     TAKE these medications    acetaminophen 325 MG  tablet Commonly known as: TYLENOL Take 325 mg by mouth as needed for mild pain (pain score 1-3) (low back, hip, and upper leg pain).   APPLE CIDER VINEGAR PO Take 5,220 mg by mouth in the morning, at noon, and at bedtime. 1740 mg each   carvedilol 12.5 MG tablet Commonly known as: COREG TAKE 2 TABLETS TWICE DAILY What changed:  how much to take how to take this when to take this additional instructions   Cyanocobalamin 2000 MCG/ML Soln Inject 1,000 mg as directed every 30 (thirty) days. Inject on the 21st of each  month   furosemide 20 MG tablet Commonly known as: LASIX TAKE 1 TABLET EVERY DAY   glipiZIDE 5 MG tablet Commonly known as: GLUCOTROL TAKE 1 TABLET TWICE DAILY BEFORE MEALS   ibuprofen 200 MG tablet Commonly known as: ADVIL Take 400 mg by mouth every 6 (six) hours as needed for mild pain (pain score 1-3) or moderate pain (pain score 4-6) (low back, hip, and upper leg).   isosorbide mononitrate 60 MG 24 hr tablet Commonly known as: IMDUR TAKE 1 TABLET EVERY DAY   Jardiance 25 MG Tabs tablet Generic drug: empagliflozin TAKE 1 TABLET EVERY DAY BEFORE BREAKFAST   losartan 25 MG tablet Commonly known as: COZAAR TAKE 1 TABLET TWICE DAILY   metFORMIN 1000 MG tablet Commonly known as: GLUCOPHAGE TAKE 1 TABLET TWICE DAILY WITH FOOD   nitroGLYCERIN 0.4 MG SL tablet Commonly known as: NITROSTAT Place 1 tablet (0.4 mg total) under the tongue every 5 (five) minutes as needed for chest pain.   Repatha SureClick 140 MG/ML Soaj Generic drug: Evolocumab INJECT 140 MG INTO THE SKIN EVERY 14 (FOURTEEN) DAYS.   Trulicity 1.5 MG/0.5ML Soaj Generic drug: Dulaglutide INJECT 1.5MG  (1 PEN) UNDER THE SKIN EVERY 7 DAYS   Vitamin D3 50 MCG (2000 UT) capsule TAKE 1 CAPSULE (2000 UNITS TOTAL) BY MOUTH DAILY.   Xarelto 20 MG Tabs tablet Generic drug: rivaroxaban TAKE 1 TABLET EVERY DAY WITH SUPPER        Follow-up Information     Philip Aspen, Limmie Patricia, MD Follow  up.   Specialty: Internal Medicine Contact information: [address] [place] [address] [phone]                Allergies  Allergen Reactions   Statins Itching    Myalgias with rosuvastatin, atorvastatin and simvastatin     Consultations: Cardiology   Procedures/Studies: ECHOCARDIOGRAM COMPLETE Result Date: 05/01/2023    ECHOCARDIOGRAM REPORT   Patient Name:   [name] Date of Exam: 05/01/2023 Medical Rec #:  [id number]     Height:       64.0 in Accession #:    [id number]    Weight:       150.8 lb Date of Birth:  [birth year]    BSA:          1.735 m Patient Age:    72 years      BP:  153/89 mmHg Patient Gender: F             HR:           89 bpm. Exam Location:  Inpatient Procedure: 2D Echo, Cardiac Doppler, Color Doppler and Intracardiac            Opacification Agent (Both Spectral and Color Flow Doppler were            utilized during procedure). Indications:    Chest Pain  History:        Patient has prior history of Echocardiogram examinations, most                 recent 07/12/2021. CAD, Prior CABG; Risk Factors:Hypertension and                 Former Smoker.  Sonographer:    Karma Ganja Referring Phys: [id number] [facility]  Sonographer Comments: Technically difficult study due to poor echo windows. Image acquisition challenging due to breast implants. IMPRESSIONS  1. Left ventricular ejection fraction, by estimation, is 50 to 55%. The left ventricle has low normal function. The left ventricle demonstrates regional wall motion abnormalities (see scoring diagram/findings for description). Left ventricular diastolic  parameters are consistent with Grade II diastolic dysfunction (pseudonormalization).  2. Right ventricular systolic function is normal. The right ventricular size is normal. There is mildly elevated pulmonary artery systolic pressure. The estimated right ventricular systolic pressure is 43.0 mmHg.  3. The mitral valve is grossly normal. Mild  mitral valve regurgitation. No evidence of mitral stenosis.  4. The aortic valve is grossly normal. Aortic valve regurgitation is trivial. No aortic stenosis is present.  5. The inferior vena cava is dilated in size with >50% respiratory variability, suggesting right atrial pressure of 8 mmHg. Comparison(s): No significant change from prior study. Prior images reviewed side by side. FINDINGS  Left Ventricle: Left ventricular ejection fraction, by estimation, is 50 to 55%. The left ventricle has low normal function. The left ventricle demonstrates regional wall motion abnormalities. Definity contrast agent was given IV to delineate the left ventricular endocardial borders. The left ventricular internal cavity size was normal in size. There is no left ventricular hypertrophy. Left ventricular diastolic parameters are consistent with Grade II diastolic dysfunction (pseudonormalization).  LV Wall Scoring: The antero-lateral wall and mid inferolateral segment are hypokinetic. Right Ventricle: The right ventricular size is normal. No increase in right ventricular wall thickness. Right ventricular systolic function is normal. There is mildly elevated pulmonary artery systolic pressure. The tricuspid regurgitant velocity is 2.96  m/s, and with an assumed right atrial pressure of 8 mmHg, the estimated right ventricular systolic pressure is 43.0 mmHg. Left Atrium: Left atrial size was normal in size. Right Atrium: Right atrial size was normal in size. Pericardium: There is no evidence of pericardial effusion. Mitral Valve: The mitral valve is grossly normal. Mild mitral valve regurgitation. No evidence of mitral valve stenosis. Tricuspid Valve: The tricuspid valve is normal in structure. Tricuspid valve regurgitation is mild . No evidence of tricuspid stenosis. Aortic Valve: The aortic valve is grossly normal. Aortic valve regurgitation is trivial. No aortic stenosis is present. Aortic valve mean gradient measures 2.0 mmHg.  Aortic valve peak gradient measures 3.8 mmHg. Aortic valve area, by VTI measures 2.54 cm. Pulmonic Valve: The pulmonic valve was normal in structure. Pulmonic valve regurgitation is trivial. No evidence of pulmonic stenosis. Aorta: The aortic root is normal in size and structure. Venous: The inferior vena cava is dilated in  size with greater than 50% respiratory variability, suggesting right atrial pressure of 8 mmHg. IAS/Shunts: No atrial level shunt detected by color flow Doppler. Additional Comments: A device lead is visualized in the right atrium and right ventricle.  LEFT VENTRICLE PLAX 2D LVIDd:         5.00 cm      Diastology LVIDs:         3.80 cm      LV e' medial:    5.91 cm/s LV PW:         0.80 cm      LV E/e' medial:  20.6 LV IVS:        0.90 cm      LV e' lateral:   8.08 cm/s LVOT diam:     2.20 cm      LV E/e' lateral: 15.1 LV SV:         49 LV SV Index:   28 LVOT Area:     3.80 cm  LV Volumes (MOD) LV vol d, MOD A2C: 121.0 ml LV vol d, MOD A4C: 138.0 ml LV vol s, MOD A2C: 49.9 ml LV vol s, MOD A4C: 72.8 ml LV SV MOD A2C:     71.1 ml LV SV MOD A4C:     138.0 ml LV SV MOD BP:      75.3 ml RIGHT VENTRICLE            IVC RV Basal diam:  4.10 cm    IVC diam: 2.20 cm RV S prime:     8.55 cm/s TAPSE (M-mode): 2.2 cm LEFT ATRIUM             Index        RIGHT ATRIUM           Index LA diam:        3.70 cm 2.13 cm/m   RA Area:     19.40 cm LA Vol (A2C):   43.4 ml 25.01 ml/m  RA Volume:   49.60 ml  28.59 ml/m LA Vol (A4C):   46.0 ml 26.51 ml/m LA Biplane Vol: 47.7 ml 27.49 ml/m  AORTIC VALVE AV Area (Vmax):    2.36 cm AV Area (Vmean):   2.38 cm AV Area (VTI):     2.54 cm AV Vmax:           97.35 cm/s AV Vmean:          65.150 cm/s AV VTI:            0.192 m AV Peak Grad:      3.8 mmHg AV Mean Grad:      2.0 mmHg LVOT Vmax:         60.50 cm/s LVOT Vmean:        40.800 cm/s LVOT VTI:          0.128 m LVOT/AV VTI ratio: 0.67  AORTA Ao Root diam: 3.00 cm Ao Asc diam:  2.90 cm MITRAL VALVE                 TRICUSPID VALVE MV Area (PHT): 5.31 cm     TR Peak grad:   35.0 mmHg MV Decel Time: 143 msec     TR Vmax:        296.00 cm/s MV E velocity: 122.00 cm/s MV A velocity: 68.10 cm/s   SHUNTS MV E/A ratio:  1.79         Systemic VTI:  0.13 m  Systemic Diam: 2.20 cm Weston Brass MD Electronically signed by Weston Brass MD Signature Date/Time: 05/01/2023/11:24:10 AM    Final    DG Chest 2 View Result Date: 04/30/2023 CLINICAL DATA:  Chest pain, shortness of breath EXAM: CHEST - 2 VIEW COMPARISON:  10/12/2021 FINDINGS: Lungs are clear.  No pleural effusion or pneumothorax. The heart is normal in size. Postsurgical changes related to prior CABG. Atrial closure device. Left subclavian pacemaker. Surgical clips along the bilateral chest wall. Median sternotomy.  Thoracic spine is within normal limits. IMPRESSION: Normal chest radiographs. Postsurgical changes, as above. Electronically Signed   By: Charline Bills M.D.   On: 04/30/2023 23:51   CUP PACEART REMOTE DEVICE CHECK Result Date: 04/17/2023 Scheduled remote reviewed. Normal device function.  Known PAF, on OAC, good ventricular rate control, less than 1% of the time Next remote 91 days. ML, CVRS      Discharge Exam: Vitals:   05/01/23 0904 05/01/23 1030  BP:  (!) 149/76  Pulse:  83  Resp:  (!) 21  Temp: 97.8 F (36.6 C)   SpO2:  100%    General: Pt is alert, awake, not in acute distress Cardiovascular: RRR, S1/S2 +, no edema Respiratory: CTA bilaterally, no wheezing, no rhonchi, no respiratory distress, no conversational dyspnea  Abdominal: Soft, NT, ND, bowel sounds + Extremities: no edema, no cyanosis Psych: Normal mood and affect, stable judgement and insight     The results of significant diagnostics from this hospitalization (including imaging, microbiology, ancillary and laboratory) are listed below for reference.     Microbiology: No results found for this or any previous visit (from the past 240  hours).   Labs: BNP (last 3 results) No results for input(s): "BNP" in the last 8760 hours. Basic Metabolic Panel: Recent Labs  Lab 04/30/23 2345  NA 135  K 4.1  CL 99  CO2 24  GLUCOSE 180*  BUN 21  CREATININE 0.95  CALCIUM 9.4   Liver Function Tests: No results for input(s): "AST", "ALT", "ALKPHOS", "BILITOT", "PROT", "ALBUMIN" in the last 168 hours. No results for input(s): "LIPASE", "AMYLASE" in the last 168 hours. No results for input(s): "AMMONIA" in the last 168 hours. CBC: Recent Labs  Lab 04/30/23 2345  WBC 6.6  HGB 13.2  HCT 39.8  MCV 94.1  PLT 220   Cardiac Enzymes: No results for input(s): "CKTOTAL", "CKMB", "CKMBINDEX", "TROPONINI" in the last 168 hours. BNP: Invalid input(s): "POCBNP" CBG: Recent Labs  Lab 05/01/23 0505 05/01/23 0945  GLUCAP 137* 148*   D-Dimer No results for input(s): "DDIMER" in the last 72 hours. Hgb A1c No results for input(s): "HGBA1C" in the last 72 hours. Lipid Profile No results for input(s): "CHOL", "HDL", "LDLCALC", "TRIG", "CHOLHDL", "LDLDIRECT" in the last 72 hours. Thyroid function studies No results for input(s): "TSH", "T4TOTAL", "T3FREE", "THYROIDAB" in the last 72 hours.  Invalid input(s): "FREET3" Anemia work up No results for input(s): "VITAMINB12", "FOLATE", "FERRITIN", "TIBC", "IRON", "RETICCTPCT" in the last 72 hours. Urinalysis    Component Value Date/Time   COLORURINE YELLOW 04/11/2020 0105   APPEARANCEUR CLEAR 04/11/2020 0105   LABSPEC 1.029 04/11/2020 0105   PHURINE 6.0 04/11/2020 0105   GLUCOSEU >=500 (A) 04/11/2020 0105   HGBUR NEGATIVE 04/11/2020 0105   BILIRUBINUR NEGATIVE 04/11/2020 0105   BILIRUBINUR N 07/01/2017 1627   KETONESUR NEGATIVE 04/11/2020 0105   PROTEINUR NEGATIVE 04/11/2020 0105   UROBILINOGEN 0.2 07/01/2017 1627   UROBILINOGEN 0.2 10/23/2013 0949   NITRITE NEGATIVE 04/11/2020 0105  LEUKOCYTESUR TRACE (A) 04/11/2020 0105   Sepsis Labs Recent Labs  Lab 04/30/23 2345   WBC 6.6   Microbiology No results found for this or any previous visit (from the past 240 hours).   Patient was seen and examined on the day of discharge and was found to be in stable condition. Time coordinating discharge: 40 minutes including assessment and coordination of care, as well as examination of the patient.   SIGNED:  Noralee Stain, DO [facility] 05/01/2023, 11:24 AM

## 2023-05-01 NOTE — ED Notes (Signed)
 The pt reports that she has no chest pain now but she has a headache and neck pain

## 2023-05-01 NOTE — Telephone Encounter (Signed)
 Per secure chat by Nahser:  [name] / [name] . would we be able to get a lexiscan myoview on this patient soon. she was admitted to the ER last night with cp. resolved after 40 min with SL NTG. trops are negative. I would rather do the nuc study at our office than at [facility]   Order placed and routed to MD to sign attestation.

## 2023-05-01 NOTE — ED Notes (Signed)
 This RN reviewed discharge instructions with patient. She verbalized understanding and denied any further questions. PT well appearing upon discharge and reports no pain. Pt ambulated with stable gait to exit. Pt endorses ride home.

## 2023-05-01 NOTE — Progress Notes (Signed)
 Rounding Note    Patient Name: [name] Date of Encounter: 05/01/2023  [facility] Cardiologist: Thurmon Fair, MD    Subjective   73 year old female with a history of coronary artery disease, status post CABG in 2014.  She has a history of paroxysmal atrial fibrillation, CHF and is status post [facility] biventricular pacer.  She has a history of hyperlipidemia, hypertension.  She was admitted to the ER last night with chest pain.  Troponins are negative.  Her chest pain resolved after 40 minutes after receiving sublingual nitroglycerin.  She is felt quite well overnight and has not had any recurrent chest pain.  She feels that she is stable to go home.  We have arranged for her to have a Myoview study tomorrow morning in the office at 730.  She feels comfortable going home.  Have given her usual ER precautions.   Inpatient Medications    Scheduled Meds:  furosemide  20 mg Oral Daily   insulin aspart  0-6 Units Subcutaneous Q4H   isosorbide mononitrate  60 mg Oral Daily   losartan  25 mg Oral BID   Continuous Infusions:  PRN Meds: acetaminophen, nitroGLYCERIN, ondansetron (ZOFRAN) IV, oxyCODONE, perflutren lipid microspheres (DEFINITY) IV suspension   Vital Signs    Vitals:   05/01/23 0530 05/01/23 0830 05/01/23 0904 05/01/23 1030  BP: (!) 153/83 (!) 150/90  (!) 149/76  Pulse: 84 83  83  Resp: (!) 23 11  (!) 21  Temp:   97.8 F (36.6 C)   TempSrc:   Oral   SpO2: 95% 98%  100%  Weight:      Height:       No intake or output data in the 24 hours ending 05/01/23 1115    04/30/2023   11:32 PM 01/28/2023    8:24 AM 10/07/2022    4:36 PM  Last 3 Weights  Weight (lbs) 150 lb 12.7 oz 150 lb 12.8 oz 152 lb 9.6 oz  Weight (kg) 68.4 kg 68.402 kg 69.219 kg      Telemetry    V paced  - Personally Reviewed  ECG     - Personally Reviewed  Physical Exam   GEN: No acute distress.   Neck: No JVD Cardiac: RRR, no murmurs, rubs, or gallops.   Respiratory: Clear to auscultation bilaterally. GI: Soft, nontender, non-distended  MS: No edema; No deformity. Neuro:  Nonfocal  Psych: Normal affect   Labs    High Sensitivity Troponin:   Recent Labs  Lab 04/30/23 2345 05/01/23 0150 05/01/23 0602  TROPONINIHS 8 10 10      Chemistry Recent Labs  Lab 04/30/23 2345  NA 135  K 4.1  CL 99  CO2 24  GLUCOSE 180*  BUN 21  CREATININE 0.95  CALCIUM 9.4  GFRNONAA >60  ANIONGAP 12    Lipids No results for input(s): "CHOL", "TRIG", "HDL", "LABVLDL", "LDLCALC", "CHOLHDL" in the last 168 hours.  Hematology Recent Labs  Lab 04/30/23 2345  WBC 6.6  RBC 4.23  HGB 13.2  HCT 39.8  MCV 94.1  MCH 31.2  MCHC 33.2  RDW 12.3  PLT 220   Thyroid No results for input(s): "TSH", "FREET4" in the last 168 hours.  BNPNo results for input(s): "BNP", "PROBNP" in the last 168 hours.  DDimer No results for input(s): "DDIMER" in the last 168 hours.   Radiology    DG Chest 2 View Result Date: 04/30/2023 CLINICAL DATA:  Chest pain, shortness of breath EXAM: CHEST -  2 VIEW COMPARISON:  10/12/2021 FINDINGS: Lungs are clear.  No pleural effusion or pneumothorax. The heart is normal in size. Postsurgical changes related to prior CABG. Atrial closure device. Left subclavian pacemaker. Surgical clips along the bilateral chest wall. Median sternotomy.  Thoracic spine is within normal limits. IMPRESSION: Normal chest radiographs. Postsurgical changes, as above. Electronically Signed   By: Charline Bills M.D.   On: 04/30/2023 23:51    Cardiac Studies     Patient Profile     73 y.o. female   Assessment & Plan    1.  Chest pain: Her chest pains are somewhat atypical.  She took several nitroglycerin and had relief of the chest discomfort after about 40 minutes.  She walks on an intermittent basis and has not had any previous episodes of chest discomfort.  Echocardiogram from today reveals overall well-preserved left ventricular systolic  function with EF around 50 to 55%.  She has an inferior wall motion defect.  This wall motion defect was present in her previous echocardiogram in 2023. I reviewed the study with Dr. Jacques Navy who agrees the wall motion abnormality is basically unchanged.   She is very stable this morning.  She has not had any recurrent pain.  I have scheduled her for a Lexiscan Myoview study tomorrow morning at 730 at our office.  She knows to be n.p.o.  She will avoid caffeine.  Our nuclear office and nurses will call her to schedule the test.  I have given her the usual ER precautions.  2.  History of coronary artery disease: She is status post coronary artery bypass grafting in 2014.       For questions or updates, please contact [facility] Please consult [email or web address] for contact info under        Signed, Kristeen Miss, MD  05/01/2023, 11:15 AM

## 2023-05-01 NOTE — H&P (Addendum)
 History and Physical    [name] ZOX:[id number] DOB: 1950/07/08 DOA: 04/30/2023  PCP: [name], Michelle Patricia, MD   Patient coming from: Home   Chief Complaint: Chest pain   HPI: [name] is a 73 y.o. female with medical history significant for hypertension, type 2 diabetes mellitus, hyperlipidemia, CAD status post CABG in 2014, atrial fibrillation on Xarelto, complete heart block with pacer, statin-intolerance, and chronic combined systolic and diastolic CHF (EF has normalized with CRT-P) who presents for evaluation of chest pain.  Patient has been experiencing increased fatigue and exertional dyspnea for roughly 3 months now but was not having chest discomfort until last night.  She was washing dishes last night when she developed an intense pressure sensation in her chest with discomfort in her neck and bilateral arms.  She took 2 nitroglycerin at home with improvement and the chest pressure eventually resolved prior to arrival in the ED.  It lasted roughly 40 minutes.  She denies recent cough, leg swelling, fevers, or chills.  ED Course: Upon arrival to the ED, patient is found to be afebrile with normal heart rate and stable blood pressure.  Labs are most notable for normal troponin x 2, normal WBC, and creatinine 0.95.  EKG demonstrates a paced rhythm and no acute findings noted on chest x-ray.  Cardiology was consulted by the ED PA and hospitalist admission was recommended.  Review of Systems:  All other systems reviewed and apart from HPI, are negative.  Past Medical History:  Diagnosis Date   Acute on chronic combined systolic and diastolic CHF, NYHA class 3 (HCC) 03/19/2013   AICD (automatic cardioverter/defibrillator) present 01/2013   Biventricular cardiac pacemaker in situ    Allergic rhinitis    Anogenital warts 01/22/2011   Anxiety state, unspecified 05/13/2013   BREAST CANCER, HX OF 09/19/2009   Qualifier: Diagnosis of  By: Tawanna Cooler MD, Tinnie Gens A    CAD  (coronary artery disease)    a. 2004: s/p MI in [place]. No PCI->Medical RX;  b. 07/2012 Cath: LM 30-40, LAD 70p, 70/78m, D1 80-90p, OM1 small 90p, OM2 large 80-90p, 19m, 70-80d, RCA 20-30 diff, EF 40%, glob HK.s/p CABG   Cancer of left breast (HCC) 2002   Patient reports left breast cancer diagnosis in 2002 treated with bilateral mastectomy positive lymph nodes with left axillary dissection followed by chemotherapy of unknown type   Cataract    Chronic combined systolic and diastolic CHF, NYHA class 2 (HCC) 01/08/2013   Diabetes mellitus type II    Exertional dyspnea 08/05/2014   Exertional shortness of breath    Generalized osteoarthrosis, involving multiple sites 08/25/2015   History of colon polyps    Hyperlipidemia    Hypertension    Incisional hernia, without obstruction or gangrene 05/04/2015   Ischemic cardiomyopathy    a. 07/2012 Echo: EF 35%, Sev inferoseptal HK, mildly dil LA, Peak PASP .   LBBB (left bundle branch block)    a. intermittent - present during rapid afib 07/2012.   MYOCARDIAL INFARCTION, HX OF 09/19/2009   Qualifier: Diagnosis of  By: Tawanna Cooler MD, Eugenio Hoes    Neuromuscular disorder [facility])    Patient reports chronic numbness in the right foot related to previous surgery on the right leg and "nerve damage"   PAF (paroxysmal atrial fibrillation) (HCC)    a. 07/2012: Amio and xarelto initiated.   Right carotid bruit 08/11/2012   SBO (small bowel obstruction) (HCC)    SOB (shortness of breath) 02/26/2018   Type 2  diabetes mellitus with circulatory disorder, without long-term current use of insulin (HCC) 10/03/2015   Vitamin D deficiency 02/22/2016    Past Surgical History:  Procedure Laterality Date   ABDOMINAL HYSTERECTOMY  2000   APPENDECTOMY  1974   BI-VENTRICULAR PACEMAKER INSERTION N/A 01/25/2013   Procedure: BI-VENTRICULAR PACEMAKER INSERTION (CRT-P);  Surgeon: Marinus Maw, MD;  Location: [facility] CATH LAB;  Service: Cardiovascular;  Laterality: N/A;   BREAST  BIOPSY Left 2002   CARDIAC CATHETERIZATION     CHOLECYSTECTOMY OPEN  1974   CORONARY ARTERY BYPASS GRAFT N/A 09/28/2012   Procedure: CORONARY ARTERY BYPASS GRAFTING (CABG);  Surgeon: Delight Ovens, MD;  Location: [facility] OR;  Service: Open Heart Surgery;  Laterality: N/A;  CABG x four, using left internal mammary artery and left leg greater saphenous vein harvested endoscopically   DILATION AND CURETTAGE OF UTERUS  1983   EPICARDIAL PACING LEAD PLACEMENT N/A 09/28/2012   Procedure: EPICARDIAL PACING LEAD PLACEMENT;  Surgeon: Delight Ovens, MD;  Location: [facility] OR;  Service: Thoracic;  Laterality: N/A;  LV LEAD PLACEMENT   HERNIA REPAIR     INCISIONAL HERNIA REPAIR N/A 05/25/2015   Procedure: LAPAROSCOPIC INCISIONAL HERNIA WITH MESH ;  Surgeon: Emelia Loron, MD;  Location: [facility] OR;  Service: General;  Laterality: N/A;   INCONTINENCE SURGERY  2000   INSERTION OF MESH N/A 05/25/2015   Procedure: INSERTION OF MESH;  Surgeon: Emelia Loron, MD;  Location: [facility] OR;  Service: General;  Laterality: N/A;   INTRAOPERATIVE TRANSESOPHAGEAL ECHOCARDIOGRAM N/A 09/28/2012   Procedure: INTRAOPERATIVE TRANSESOPHAGEAL ECHOCARDIOGRAM;  Surgeon: Delight Ovens, MD;  Location: [facility] OR;  Service: Open Heart Surgery;  Laterality: N/A;   LAPAROSCOPIC INCISIONAL / UMBILICAL / VENTRAL HERNIA REPAIR  05/25/2015   IHR   LEFT HEART CATHETERIZATION WITH CORONARY ANGIOGRAM N/A 07/20/2012   Procedure: LEFT HEART CATHETERIZATION WITH CORONARY ANGIOGRAM;  Surgeon: Peter M Swaziland, MD;  Location: [facility] CATH LAB;  Service: Cardiovascular;  Laterality: N/A;   MASTECTOMY Right 2002   MASTECTOMY MODIFIED RADICAL W/ AXILLARY LYMPH NODES W/ OR W/O PECTORALIS MINOR Left 2002   MAZE N/A 09/28/2012   Procedure: MAZE;  Surgeon: Delight Ovens, MD;  [facility] OR;  Service: Open Heart Surgery;  Laterality: N/A;   ORIF WRIST FRACTURE Right 11/08/2017   Procedure: OPEN REDUCTION INTERNAL FIXATION (ORIF) WRIST FRACTURE;  Surgeon: Dominica Severin, MD;  Location: [facility] OR;  Service: Orthopedics;  Laterality: Right;  90 mins   PPM GENERATOR CHANGEOUT N/A 04/17/2020   Procedure: PPM GENERATOR CHANGEOUT;  Surgeon: Thurmon Fair, MD;  Location: [facility] INVASIVE CV LAB;  Service: Cardiovascular;  Laterality: N/A;   TENDON REPAIR Right 2001 X 3-4   torn ligaments and tendons in ankle up to knee from work related accident   TUBAL LIGATION  1987    Social History:   reports that she quit smoking about 11 years ago. Her smoking use included cigarettes. She started smoking about 21 years ago. She has a 10 pack-year smoking history. She has never used smokeless tobacco. She reports that she does not drink alcohol and does not use drugs.  Allergies  Allergen Reactions   Statins Itching    Myalgias with rosuvastatin, atorvastatin and simvastatin     Family History  Problem Relation Age of Onset   Kidney failure Mother    Heart disease Mother        Died mid 53s complications of diabetes   Diabetes Mother    Colon cancer Sister  Arthritis Other    Diabetes Other    Hyperlipidemia Other    Ovarian cancer Other    Liver cancer Brother    Stomach cancer Neg Hx    Rectal cancer Neg Hx    Esophageal cancer Neg Hx    Pancreatic cancer Neg Hx      Prior to Admission medications   Medication Sig Start Date End Date Taking? Authorizing Provider  APPLE CIDER VINEGAR PO Take 5,220 mg by mouth in the morning, at noon, and at bedtime. 1740 mg each    [provider]  Blood Glucose Monitoring Suppl (TRUE METRIX AIR GLUCOSE METER) DEVI Check blood sugar 2 times a day 08/10/19   Carlus Pavlov, MD  carvedilol (COREG) 12.5 MG tablet Take 2 tablets by Mouth Twice a day. 06/13/22   Croitoru, Mihai, MD  Cholecalciferol (VITAMIN D3) 50 MCG (2000 UT) capsule TAKE 1 CAPSULE (2000 UNITS TOTAL) BY MOUTH DAILY. 09/11/22   [name], Michelle Patricia, MD  cyclobenzaprine (FLEXERIL) 5 MG tablet Take 1 tablet (5 mg total) by mouth at bedtime. 02/20/23    [name], Michelle Patricia, MD  Dulaglutide (TRULICITY) 1.5 MG/0.5ML SOPN INJECT 1.5MG  (1 PEN) UNDER THE SKIN EVERY 7 DAYS 10/30/22   [name], Michelle Patricia, MD  furosemide (LASIX) 20 MG tablet TAKE 1 TABLET EVERY DAY 11/13/22   Croitoru, Mihai, MD  glipiZIDE (GLUCOTROL) 5 MG tablet TAKE 1 TABLET TWICE DAILY BEFORE MEALS 11/13/22   [name], Michelle Patricia, MD  glucose blood (TRUE METRIX BLOOD GLUCOSE TEST) test strip Use to check blood sugar 2 times a day. 08/10/19   Carlus Pavlov, MD  isosorbide mononitrate (IMDUR) 60 MG 24 hr tablet TAKE 1 TABLET EVERY DAY 11/13/22   Croitoru, Mihai, MD  JARDIANCE 25 MG TABS tablet TAKE 1 TABLET EVERY DAY BEFORE BREAKFAST 11/13/22   [name], Michelle Patricia, MD  losartan (COZAAR) 25 MG tablet TAKE 1 TABLET TWICE DAILY 09/05/22   Croitoru, Mihai, MD  metFORMIN (GLUCOPHAGE) 1000 MG tablet TAKE 1 TABLET TWICE DAILY WITH FOOD 11/13/22   [name], Michelle Patricia, MD  nitroGLYCERIN (NITROSTAT) 0.4 MG SL tablet Place 1 tablet (0.4 mg total) under the tongue every 5 (five) minutes as needed for chest pain. 04/19/19   Croitoru, Mihai, MD  REPATHA SURECLICK 140 MG/ML SOAJ INJECT 140 MG INTO THE SKIN EVERY 14 (FOURTEEN) DAYS. 11/13/22   Croitoru, Mihai, MD  rivaroxaban (XARELTO) 20 MG TABS tablet TAKE 1 TABLET EVERY DAY WITH SUPPER 01/22/23   Croitoru, Mihai, MD  TRUEplus Lancets 33G MISC Use to check blood sugar 2 times a day. 08/10/19   Carlus Pavlov, MD    Physical Exam: Vitals:   05/01/23 0145 05/01/23 0200 05/01/23 0215 05/01/23 0230  BP:      Pulse: 83 80 82 86  Resp: 16 17 16  (!) 26  Temp:      SpO2: 97% 100% 100% 100%  Weight:      Height:         Constitutional: NAD, calm  Eyes: PERTLA, lids and conjunctivae normal ENMT: Mucous membranes are moist. Posterior pharynx clear of any exudate or lesions.   Neck: supple, no masses  Respiratory: no wheezing, no crackles. No accessory muscle use.  Cardiovascular: S1 & S2 heard, regular rate and rhythm. No  extremity edema.   Abdomen: No distension, no tenderness, soft. Bowel sounds active.  Musculoskeletal: no clubbing / cyanosis. No joint deformity upper and lower extremities.   Skin: no significant rashes, lesions,  ulcers. Warm, dry, well-perfused. Neurologic: CN 2-12 grossly intact. Moving all extremities. Alert and oriented.  Psychiatric: Pleasant. Cooperative.    Labs and Imaging on Admission: I have personally reviewed following labs and imaging studies  CBC: Recent Labs  Lab 04/30/23 2345  WBC 6.6  HGB 13.2  HCT 39.8  MCV 94.1  PLT 220   Basic Metabolic Panel: Recent Labs  Lab 04/30/23 2345  NA 135  K 4.1  CL 99  CO2 24  GLUCOSE 180*  BUN 21  CREATININE 0.95  CALCIUM 9.4   GFR: Estimated Creatinine Clearance: 51.6 mL/min (by C-G formula based on SCr of 0.95 mg/dL). Liver Function Tests: No results for input(s): "AST", "ALT", "ALKPHOS", "BILITOT", "PROT", "ALBUMIN" in the last 168 hours. No results for input(s): "LIPASE", "AMYLASE" in the last 168 hours. No results for input(s): "AMMONIA" in the last 168 hours. Coagulation Profile: No results for input(s): "INR", "PROTIME" in the last 168 hours. Cardiac Enzymes: No results for input(s): "CKTOTAL", "CKMB", "CKMBINDEX", "TROPONINI" in the last 168 hours. BNP (last 3 results) No results for input(s): "PROBNP" in the last 8760 hours. HbA1C: No results for input(s): "HGBA1C" in the last 72 hours. CBG: No results for input(s): "GLUCAP" in the last 168 hours. Lipid Profile: No results for input(s): "CHOL", "HDL", "LDLCALC", "TRIG", "CHOLHDL", "LDLDIRECT" in the last 72 hours. Thyroid Function Tests: No results for input(s): "TSH", "T4TOTAL", "FREET4", "T3FREE", "THYROIDAB" in the last 72 hours. Anemia Panel: No results for input(s): "VITAMINB12", "FOLATE", "FERRITIN", "TIBC", "IRON", "RETICCTPCT" in the last 72 hours. Urine analysis:    Component Value Date/Time   COLORURINE YELLOW 04/11/2020 0105    APPEARANCEUR CLEAR 04/11/2020 0105   LABSPEC 1.029 04/11/2020 0105   PHURINE 6.0 04/11/2020 0105   GLUCOSEU >=500 (A) 04/11/2020 0105   HGBUR NEGATIVE 04/11/2020 0105   BILIRUBINUR NEGATIVE 04/11/2020 0105   BILIRUBINUR N 07/01/2017 1627   KETONESUR NEGATIVE 04/11/2020 0105   PROTEINUR NEGATIVE 04/11/2020 0105   UROBILINOGEN 0.2 07/01/2017 1627   UROBILINOGEN 0.2 10/23/2013 0949   NITRITE NEGATIVE 04/11/2020 0105   LEUKOCYTESUR TRACE (A) 04/11/2020 0105   Sepsis Labs: @LABRCNTIP (procalcitonin:4,lacticidven:4) )No results found for this or any previous visit (from the past 240 hours).   Radiological Exams on Admission: DG Chest 2 View Result Date: 04/30/2023 CLINICAL DATA:  Chest pain, shortness of breath EXAM: CHEST - 2 VIEW COMPARISON:  10/12/2021 FINDINGS: Lungs are clear.  No pleural effusion or pneumothorax. The heart is normal in size. Postsurgical changes related to prior CABG. Atrial closure device. Left subclavian pacemaker. Surgical clips along the bilateral chest wall. Median sternotomy.  Thoracic spine is within normal limits. IMPRESSION: Normal chest radiographs. Postsurgical changes, as above. Electronically Signed   By: Charline Bills M.D.   On: 04/30/2023 23:51    EKG: Independently reviewed. Paced rhythm.   Assessment/Plan   1. Chest pain; CAD  - Continue cardiac monitoring, hold Coreg and Xarelto per cardiology recommendations, follow-up on echo results    2. PAF  - Xarelto and Coreg held on admission   3. Chronic HFpEF  - EF has normalized with CRT-P  - Appears euvolemic  - Continue Lasix, losartan   4. Type II DM  - A1c was 6.5% in November 2024  - Check CBGs and use low-intensity SSI for now    DVT prophylaxis: Xarelto  Code Status: Full  Level of Care: Level of care: Telemetry Cardiac Family Communication: Husband at bedside   Disposition Plan:  Patient is from: Home  Anticipated  d/c is to: Home Anticipated d/c date is: 05/02/23  Patient  currently: Pending cardiology consultation  Consults called: Cardiology  Admission status: Observation     Briscoe Deutscher, MD [facility]  05/01/2023, 3:22 AM

## 2023-05-01 NOTE — ED Provider Triage Note (Signed)
 Emergency Medicine Provider Triage Evaluation Note  Zhana Jeangilles , a 73 y.o. female  was evaluated in triage.  Pt complains of chest pain with radiation to neck, some shob starting at 2250. Improved after 2 nitroglycerin. Hx of ACS, cabg. Pacemaker in place for ~10 years.  Review of Systems  Positive: Chest pain, shob, neck pain Negative: Nausea, vomiting  Physical Exam  BP (!) 158/89 (BP Location: Right Arm)   Pulse 97   Temp 97.8 F (36.6 C)   Resp (!) 24   Ht 5\' 4"  (1.626 m)   Wt 68.4 kg   SpO2 98%   BMI 25.88 kg/m  Gen:   Awake, no distress   Resp:  Normal effort  MSK:   Moves extremities without difficulty  Other:    Medical Decision Making  Medically screening exam initiated at 12:18 AM.  Appropriate orders placed.  Mazelle Huebert was informed that the remainder of the evaluation will be completed by another provider, this initial triage assessment does not replace that evaluation, and the importance of remaining in the ED until their evaluation is complete.  Workup initiated in triage    Olene Floss, [place] 05/01/23 1610

## 2023-05-02 ENCOUNTER — Ambulatory Visit (HOSPITAL_COMMUNITY): Payer: Medicare HMO | Attending: Cardiovascular Disease

## 2023-05-02 DIAGNOSIS — R079 Chest pain, unspecified: Secondary | ICD-10-CM | POA: Insufficient documentation

## 2023-05-02 LAB — MYOCARDIAL PERFUSION IMAGING
Base ST Depression (mm): 0 mm
LV dias vol: 63 mL (ref 46–106)
LV sys vol: 26 mL
Nuc Stress EF: 58 %
Peak HR: 91 {beats}/min
Rest HR: 78 {beats}/min
Rest Nuclear Isotope Dose: 11 mCi
SDS: 3
SRS: 6
SSS: 9
ST Depression (mm): 0 mm
Stress Nuclear Isotope Dose: 32.7 mCi
TID: 1.02

## 2023-05-02 MED ORDER — TECHNETIUM TC 99M TETROFOSMIN IV KIT
11.0000 | PACK | Freq: Once | INTRAVENOUS | Status: AC | PRN
  Administered 2023-05-02: 11 via INTRAVENOUS

## 2023-05-02 MED ORDER — TECHNETIUM TC 99M TETROFOSMIN IV KIT
32.7000 | PACK | Freq: Once | INTRAVENOUS | Status: AC | PRN
  Administered 2023-05-02: 32.7 via INTRAVENOUS

## 2023-05-02 MED ORDER — REGADENOSON 0.4 MG/5ML IV SOLN
0.4000 mg | Freq: Once | INTRAVENOUS | Status: AC
  Administered 2023-05-02: 0.4 mg via INTRAVENOUS

## 2023-05-04 ENCOUNTER — Encounter: Payer: Self-pay | Admitting: Cardiovascular Disease

## 2023-05-06 ENCOUNTER — Ambulatory Visit: Payer: Medicare HMO

## 2023-05-07 ENCOUNTER — Telehealth: Payer: Self-pay

## 2023-05-07 ENCOUNTER — Encounter: Payer: Self-pay | Admitting: Cardiovascular Disease

## 2023-05-07 ENCOUNTER — Ambulatory Visit: Payer: Medicare HMO

## 2023-05-07 NOTE — Telephone Encounter (Signed)
 Appointment rescheduled.

## 2023-05-07 NOTE — Telephone Encounter (Signed)
 Copied from CRM [id number]. Topic: Appointments - Appointment Cancel/Reschedule >> May 07, 2023  8:18 AM Lennart Pall wrote: Reason for CRM: Needed to cancel appt for 2/27 for B12 shot, patient has a stomach bug. Next available that she can come in was 3/5 at 9:15am. It will not let me schedule for that time and it is open.

## 2023-05-08 MED ORDER — NITROGLYCERIN 0.4 MG SL SUBL: 0.4000 mg | SUBLINGUAL_TABLET | SUBLINGUAL | 3 refills | Status: DC | PRN

## 2023-05-08 NOTE — Telephone Encounter (Signed)
 Hi Ms. [name] we have sent you in a refill for the nitroglycerin. Let us know if you need anything else. :)

## 2023-05-11 NOTE — Progress Notes (Unsigned)
 Cardiology Office Note    Date:  05/13/2023  ID:  [name], DOB May 05, 1950, MRN [id number] PCP:  Philip Aspen, Limmie Patricia, MD  Cardiologist:  Thurmon Fair, MD  Electrophysiologist:  None   Chief Complaint: Follow up for chest pain   History of Present Illness: .    [name] is a 73 y.o. female with visit-pertinent history of CAD s/p CABG in 2014, paroxysmal atrial fibrillation, CHF with recovery of EF after CRT-P ([employer]), type 2 diabetes mellitus, hypercholesterolemia, hypertension.  In 2004. she presented with myocardial infarction.  In May 2014 presented with congestive heart failure and atrial fibrillation in the setting of three-vessel coronary artery disease leading to bypass surgery in July 2014 with LIMA to LAD, SVG to ramus intermedius, sequential SVG to OM1 and OM 2, surgical maze procedure.  She had persistent depressed left ventricular systolic function and received a dual-chamber biventricular pacemaker in 01/2013.  She was last seen in clinic on 07/08/2022 by Dr. Royann Shivers.  She denied any angina or exertional dyspnea.  She did note feeling tired a lot and was waking up feeling tired.  Her device interrogation showed normal function, she was not requiring atrial pacing and had normal heart distribution and had 100% biventricular pacing.  She had a single brief episode of paroxysmal atrial fibrillation, the first in many years.  It was a new finding that she did not have any native AV conduction, she developed complete heart block and was pacemaker dependent.  Her last echo in 04/2019 showed LVEF of 51%.  On 04/30/2023 she presented to the emergency room with chest pain.  Her troponins were negative, her chest pain had resolved 40 minutes after receiving sublingual nitroglycerin.  She was evaluated by Dr. Elease Hashimoto the following morning and denied any recurrence of chest pain.  Echocardiogram indicated LVEF of 50 to 55%, grade 2 diastolic dysfunction, mildly elevated  pulmonary artery systolic pressures, she did have inferior wall motion defects that was noted on previous echocardiogram in 2023 no significant change compared to prior study.  On 05/02/2023 she underwent MPI with Lexiscan, there was no ST deviation from baseline EKG however this was nondiagnostic due to pharmacologic protocol.  LV perfusion was normal, no evidence of ischemia or infarction, the study was normal and low risk.  Today she presents for follow-up.  She reports that she has been doing ok, she denies any further chest pain.  She notes prior to her recent episode of chest discomfort she had not required any nitroglycerin in a year.  She denies any significant changes in her breathing, denies lower extremity edema and her weights have been stable.  Patient does note that she has overall felt more tired in recent months and that her body just does not "feel well".  Per patient her husband has remarked that she has recently been snoring more.  She notes she may have had a short episode of atrial fibrillation a few days ago, denies any recurrence.  She also reports that her blood pressure has been on the low side with systolics around 100-105, she does have some slight lightheadedness with this.  Labwork independently reviewed: 04/30/2023: Sodium 135, potassium 4.1, creatinine 0.95, hemoglobin 13.2, hematocrit 39.8 ROS: .   Today she denies lower extremity edema, melena, hematuria, hemoptysis, diaphoresis, weakness, presyncope, syncope, orthopnea, and PND.  All other systems are reviewed and otherwise negative. Studies Reviewed: Marland Kitchen    EKG:  EKG is not ordered today. CV Studies:  Cardiac Studies &  Procedures   ______________________________________________________________________________________________   STRESS TESTS  MYOCARDIAL PERFUSION IMAGING 05/02/2023  Narrative   ECG is abnormal. PPM with atrial sensing and ventricular pacing   A pharmacological stress test was performed using IV  Lexiscan 0.4mg  over 10 seconds performed without concurrent submaximal exercise. The patient reported dyspnea, dizziness and nausea during the stress test. Normal blood pressure and normal heart rate response noted during stress. Heart rate recovery was normal.   No ST deviation was noted from baseline EKG. The ECG was not diagnostic due to pharmacologic protocol.   LV perfusion is normal. There is no evidence of ischemia. There is no evidence of infarction.   Left ventricular function is normal. Nuclear stress EF: 58%. End diastolic cavity size is normal. End systolic cavity size is normal. No evidence of transient ischemic dilation (TID) noted.   Prior study available for comparison from 04/28/2019.   The study is normal. The study is low risk.   ECHOCARDIOGRAM  ECHOCARDIOGRAM COMPLETE 05/01/2023  Narrative ECHOCARDIOGRAM REPORT    Patient Name:   [name] Date of Exam: 05/01/2023 Medical Rec #:  [id number]     Height:       64.0 in Accession #:    [id number]    Weight:       150.8 lb Date of Birth:  [birth year]    BSA:          1.735 m Patient Age:    72 years      BP:           153/89 mmHg Patient Gender: F             HR:           89 bpm. Exam Location:  Inpatient  Procedure: 2D Echo, Cardiac Doppler, Color Doppler and Intracardiac Opacification Agent (Both Spectral and Color Flow Doppler were utilized during procedure).  Indications:    Chest Pain  History:        Patient has prior history of Echocardiogram examinations, most recent 07/12/2021. CAD, Prior CABG; Risk Factors:Hypertension and Former Smoker.  Sonographer:    Karma Ganja Referring Phys: [id number] [facility]   Sonographer Comments: Technically difficult study due to poor echo windows. Image acquisition challenging due to breast implants. IMPRESSIONS   1. Left ventricular ejection fraction, by estimation, is 50 to 55%. The left ventricle has low normal function. The left ventricle demonstrates regional  wall motion abnormalities (see scoring diagram/findings for description). Left ventricular diastolic parameters are consistent with Grade II diastolic dysfunction (pseudonormalization). 2. Right ventricular systolic function is normal. The right ventricular size is normal. There is mildly elevated pulmonary artery systolic pressure. The estimated right ventricular systolic pressure is 43.0 mmHg. 3. The mitral valve is grossly normal. Mild mitral valve regurgitation. No evidence of mitral stenosis. 4. The aortic valve is grossly normal. Aortic valve regurgitation is trivial. No aortic stenosis is present. 5. The inferior vena cava is dilated in size with >50% respiratory variability, suggesting right atrial pressure of 8 mmHg.  Comparison(s): No significant change from prior study. Prior images reviewed side by side.  FINDINGS Left Ventricle: Left ventricular ejection fraction, by estimation, is 50 to 55%. The left ventricle has low normal function. The left ventricle demonstrates regional wall motion abnormalities. Definity contrast agent was given IV to delineate the left ventricular endocardial borders. The left ventricular internal cavity size was normal in size. There is no left ventricular hypertrophy. Left ventricular diastolic parameters are consistent with Grade II diastolic dysfunction (  pseudonormalization).   LV Wall Scoring: The antero-lateral wall and mid inferolateral segment are hypokinetic.  Right Ventricle: The right ventricular size is normal. No increase in right ventricular wall thickness. Right ventricular systolic function is normal. There is mildly elevated pulmonary artery systolic pressure. The tricuspid regurgitant velocity is 2.96 m/s, and with an assumed right atrial pressure of 8 mmHg, the estimated right ventricular systolic pressure is 43.0 mmHg.  Left Atrium: Left atrial size was normal in size.  Right Atrium: Right atrial size was normal in size.  Pericardium:  There is no evidence of pericardial effusion.  Mitral Valve: The mitral valve is grossly normal. Mild mitral valve regurgitation. No evidence of mitral valve stenosis.  Tricuspid Valve: The tricuspid valve is normal in structure. Tricuspid valve regurgitation is mild . No evidence of tricuspid stenosis.  Aortic Valve: The aortic valve is grossly normal. Aortic valve regurgitation is trivial. No aortic stenosis is present. Aortic valve mean gradient measures 2.0 mmHg. Aortic valve peak gradient measures 3.8 mmHg. Aortic valve area, by VTI measures 2.54 cm.  Pulmonic Valve: The pulmonic valve was normal in structure. Pulmonic valve regurgitation is trivial. No evidence of pulmonic stenosis.  Aorta: The aortic root is normal in size and structure.  Venous: The inferior vena cava is dilated in size with greater than 50% respiratory variability, suggesting right atrial pressure of 8 mmHg.  IAS/Shunts: No atrial level shunt detected by color flow Doppler.  Additional Comments: A device lead is visualized in the right atrium and right ventricle.   LEFT VENTRICLE PLAX 2D LVIDd:         5.00 cm      Diastology LVIDs:         3.80 cm      LV e' medial:    5.91 cm/s LV PW:         0.80 cm      LV E/e' medial:  20.6 LV IVS:        0.90 cm      LV e' lateral:   8.08 cm/s LVOT diam:     2.20 cm      LV E/e' lateral: 15.1 LV SV:         49 LV SV Index:   28 LVOT Area:     3.80 cm  LV Volumes (MOD) LV vol d, MOD A2C: 121.0 ml LV vol d, MOD A4C: 138.0 ml LV vol s, MOD A2C: 49.9 ml LV vol s, MOD A4C: 72.8 ml LV SV MOD A2C:     71.1 ml LV SV MOD A4C:     138.0 ml LV SV MOD BP:      75.3 ml  RIGHT VENTRICLE            IVC RV Basal diam:  4.10 cm    IVC diam: 2.20 cm RV S prime:     8.55 cm/s TAPSE (M-mode): 2.2 cm  LEFT ATRIUM             Index        RIGHT ATRIUM           Index LA diam:        3.70 cm 2.13 cm/m   RA Area:     19.40 cm LA Vol (A2C):   43.4 ml 25.01 ml/m  RA Volume:    49.60 ml  28.59 ml/m LA Vol (A4C):   46.0 ml 26.51 ml/m LA Biplane Vol: 47.7 ml 27.49 ml/m AORTIC VALVE AV Area (Vmax):  2.36 cm AV Area (Vmean):   2.38 cm AV Area (VTI):     2.54 cm AV Vmax:           97.35 cm/s AV Vmean:          65.150 cm/s AV VTI:            0.192 m AV Peak Grad:      3.8 mmHg AV Mean Grad:      2.0 mmHg LVOT Vmax:         60.50 cm/s LVOT Vmean:        40.800 cm/s LVOT VTI:          0.128 m LVOT/AV VTI ratio: 0.67  AORTA Ao Root diam: 3.00 cm Ao Asc diam:  2.90 cm  MITRAL VALVE                TRICUSPID VALVE MV Area (PHT): 5.31 cm     TR Peak grad:   35.0 mmHg MV Decel Time: 143 msec     TR Vmax:        296.00 cm/s MV E velocity: 122.00 cm/s MV A velocity: 68.10 cm/s   SHUNTS MV E/A ratio:  1.79         Systemic VTI:  0.13 m Systemic Diam: 2.20 cm  Weston Brass MD Electronically signed by Weston Brass MD Signature Date/Time: 05/01/2023/11:24:10 AM    Final          ______________________________________________________________________________________________       Current Reported Medications:.    Current Meds  Medication Sig   acetaminophen (TYLENOL) 325 MG tablet Take 325 mg by mouth as needed for mild pain (pain score 1-3) (low back, hip, and upper leg pain).   APPLE CIDER VINEGAR PO Take 5,220 mg by mouth in the morning, at noon, and at bedtime. 1740 mg each   carvedilol (COREG) 12.5 MG tablet TAKE 2 TABLETS TWICE DAILY   Cholecalciferol (VITAMIN D3) 50 MCG (2000 UT) capsule TAKE 1 CAPSULE (2000 UNITS TOTAL) BY MOUTH DAILY.   Cyanocobalamin 2000 MCG/ML SOLN Inject 1,000 mg as directed every 30 (thirty) days. Inject on the 21st of each  month   Dulaglutide (TRULICITY) 1.5 MG/0.5ML SOPN INJECT 1.5MG  (1 PEN) UNDER THE SKIN EVERY 7 DAYS   furosemide (LASIX) 20 MG tablet TAKE 1 TABLET EVERY DAY   glipiZIDE (GLUCOTROL) 5 MG tablet TAKE 1 TABLET TWICE DAILY BEFORE MEALS   ibuprofen (ADVIL) 200 MG tablet Take 400 mg by mouth  every 6 (six) hours as needed for mild pain (pain score 1-3) or moderate pain (pain score 4-6) (low back, hip, and upper leg).   isosorbide mononitrate (IMDUR) 60 MG 24 hr tablet TAKE 1 TABLET EVERY DAY   JARDIANCE 25 MG TABS tablet TAKE 1 TABLET EVERY DAY BEFORE BREAKFAST   losartan (COZAAR) 25 MG tablet Take 0.5 tablets (12.5 mg total) by mouth daily.   metFORMIN (GLUCOPHAGE) 1000 MG tablet TAKE 1 TABLET TWICE DAILY WITH FOOD   nitroGLYCERIN (NITROSTAT) 0.4 MG SL tablet Place 1 tablet (0.4 mg total) under the tongue every 5 (five) minutes as needed for chest pain.   REPATHA SURECLICK 140 MG/ML SOAJ INJECT 140 MG INTO THE SKIN EVERY 14 (FOURTEEN) DAYS.   rivaroxaban (XARELTO) 20 MG TABS tablet TAKE 1 TABLET EVERY DAY WITH SUPPER   [DISCONTINUED] losartan (COZAAR) 25 MG tablet TAKE 1 TABLET TWICE DAILY    Physical Exam:    VS:  BP 102/62 (BP Location: Right Arm, Patient Position:  Sitting)   Pulse 86   Ht 5\' 4"  (1.626 m)   Wt 151 lb (68.5 kg)   SpO2 96%   BMI 25.92 kg/m    Wt Readings from Last 3 Encounters:  05/13/23 151 lb (68.5 kg)  05/02/23 150 lb (68 kg)  04/30/23 150 lb 12.7 oz (68.4 kg)    GEN: Well nourished, well developed in no acute distress NECK: No JVD; No carotid bruits CARDIAC: RRR, no murmurs, rubs, gallops RESPIRATORY:  Clear to auscultation without rales, wheezing or rhonchi  ABDOMEN: Soft, non-tender, non-distended EXTREMITIES:  No edema; No acute deformity   Asessement and Plan:.    CAD: s/p CABG July 2014 with LIMA to LAD, SVG to ramus intermedius, sequential SVG to OM1 and OM 2, surgical maze procedure.  Patient with recent episode of chest pain, resolved about 40 minutes after taking sublingual nitroglycerin.  In the emergency room she had negative troponins x 3.  On 2/21 she underwent MPI with Lexiscan, was felt to be a normal and low risk study.  Today she denies any further episodes of chest pain, denies any changes in breathing.  She notes that she has  recently felt slightly more fatigued and has not felt well.  She notes that her blood pressure has been slightly low, she has had some slight lightheadedness.  Will reduce losartan to 12.5 mg daily.  Encourage patient to monitor her blood pressure at home.  Reviewed ED precautions.  Continue carvedilol 12.5 mg twice daily, Lasix 20 mg daily, Imdur 60 mg daily and Xarelto 20 mg daily. Check TSH.   CHF: Echo on 05/01/2023 indicated LVEF of 50 to 55%, grade 2 diastolic dysfunction, mildly elevated pulmonary artery systolic pressures, she did have inferior wall motion defects that was noted on previous echocardiogram in 2023 no significant change compared to prior study. Today she appears euvolemic and well compensated on exam.  Continue ARB, carvedilol and SGLT2 inhibitor.  Will reduce losartan as noted above.  CHB/PAF: Interrogation on 04/19/2023 indicated greater than 99% CRT pacing, not pacemaker dependent.  Battery status was good.  Less than 1% burden of paroxysmal atrial fibrillation at one 4-hour event.  Patient notes 1 possible episode of atrial fibrillation. On Xarelto.  Denies any falls or bleeding complications.  CHA2DS2-VASc at least 6 given age, gender, CHF, CAD, hypertension and DM. Check TSH.   DM: Last hemoglobin A1c 6.5 on 01/28/2023.  Monitor managed per PCP.  HTN: Blood pressure today 102/62.  Patient notes similar soft readings at home.  Will reduce losartan to 12.5 mg daily, otherwise continue current antihypertensive regimen.   HLD: History of intolerance to multiple statins due to myopathy, she has had excellent results on Repatha.  Check fasting lipid profile and LFTs.  Sleep disordered breathing: Notes she has history of mild OSA, was previously recommended to wear an autopap but was unable to tolerate. Patient declined sleep study, she was unable to tolerate a CPAP.  Recommended patient explore options of dental devices as she again declines CPAP therapy.    Disposition: F/u with  Dr. Royann Shivers in 2-3 months.   Signed, Rip Harbour, NP

## 2023-05-13 ENCOUNTER — Encounter: Payer: Self-pay | Admitting: Cardiology

## 2023-05-13 ENCOUNTER — Ambulatory Visit: Payer: Medicare HMO | Attending: Cardiology | Admitting: Cardiology

## 2023-05-13 VITALS — BP 102/62 | HR 86 | Ht 64.0 in | Wt 151.0 lb

## 2023-05-13 DIAGNOSIS — Z950 Presence of cardiac pacemaker: Secondary | ICD-10-CM

## 2023-05-13 DIAGNOSIS — G4733 Obstructive sleep apnea (adult) (pediatric): Secondary | ICD-10-CM

## 2023-05-13 DIAGNOSIS — D6869 Other thrombophilia: Secondary | ICD-10-CM | POA: Diagnosis not present

## 2023-05-13 DIAGNOSIS — I442 Atrioventricular block, complete: Secondary | ICD-10-CM

## 2023-05-13 DIAGNOSIS — I5042 Chronic combined systolic (congestive) and diastolic (congestive) heart failure: Secondary | ICD-10-CM

## 2023-05-13 DIAGNOSIS — G720 Drug-induced myopathy: Secondary | ICD-10-CM

## 2023-05-13 DIAGNOSIS — I10 Essential (primary) hypertension: Secondary | ICD-10-CM

## 2023-05-13 DIAGNOSIS — R079 Chest pain, unspecified: Secondary | ICD-10-CM

## 2023-05-13 DIAGNOSIS — T466X5D Adverse effect of antihyperlipidemic and antiarteriosclerotic drugs, subsequent encounter: Secondary | ICD-10-CM

## 2023-05-13 DIAGNOSIS — E1159 Type 2 diabetes mellitus with other circulatory complications: Secondary | ICD-10-CM

## 2023-05-13 DIAGNOSIS — I2510 Atherosclerotic heart disease of native coronary artery without angina pectoris: Secondary | ICD-10-CM | POA: Diagnosis not present

## 2023-05-13 DIAGNOSIS — I480 Paroxysmal atrial fibrillation: Secondary | ICD-10-CM | POA: Diagnosis not present

## 2023-05-13 MED ORDER — LOSARTAN POTASSIUM 25 MG PO TABS: 12.5000 mg | ORAL_TABLET | Freq: Every day | ORAL | 3 refills | Status: AC

## 2023-05-13 NOTE — Patient Instructions (Signed)
 Medication Instructions:  Decrease the Losartan from 25mg  to 12.5mg  daily. Take one half tablet daily.  *If you need a refill on your cardiac medications before your next appointment, please call your pharmacy*   Lab Work: Return tomorrow for fasting labs. If you have labs (blood work) drawn today and your tests are completely normal, you will receive your results only by: MyChart Message (if you have MyChart) OR A paper copy in the mail If you have any lab test that is abnormal or we need to change your treatment, we will call you to review the results.   Testing/Procedures: No procedures were ordered during today's visit.    Follow-Up: At [facility], you and your health needs are our priority.  As part of our continuing mission to provide you with exceptional heart care, we have created designated Provider Care Teams.  These Care Teams include your primary Cardiologist (physician) and Advanced Practice Providers (APPs -  Physician Assistants and Nurse Practitioners) who all work together to provide you with the care you need, when you need it.  We recommend signing up for the patient portal called "MyChart".  Sign up information is provided on this After Visit Summary.  MyChart is used to connect with patients for Virtual Visits (Telemedicine).  Patients are able to view lab/test results, encounter notes, upcoming appointments, etc.  Non-urgent messages can be sent to your provider as well.   To learn more about what you can do with MyChart, go to [email or web address].    Your next appointment:   3 month(s)  Provider:   Thurmon Fair, MD     Other Instructions        1st Floor: - Lobby - Registration  - Pharmacy  - Lab - Cafe   2nd Floor: - PV Lab - Diagnostic Testing (echo, CT, nuclear med)   3rd Floor: - Vacant   4th Floor: - TCTS (cardiothoracic surgery) - AFib Clinic - Structural Heart Clinic - Vascular Surgery  - Vascular Ultrasound   5th  Floor: - HeartCare Cardiology (general and EP) - Clinical Pharmacy for coumadin, hypertension, lipid, weight-loss medications, and med management appointments      Valet parking services will be available as well.       Thank you for choosing [facility]!

## 2023-05-14 ENCOUNTER — Ambulatory Visit (INDEPENDENT_AMBULATORY_CARE_PROVIDER_SITE_OTHER): Payer: Medicare HMO | Admitting: *Deleted

## 2023-05-14 DIAGNOSIS — E538 Deficiency of other specified B group vitamins: Secondary | ICD-10-CM

## 2023-05-14 DIAGNOSIS — D6869 Other thrombophilia: Secondary | ICD-10-CM | POA: Diagnosis not present

## 2023-05-14 DIAGNOSIS — Z950 Presence of cardiac pacemaker: Secondary | ICD-10-CM | POA: Diagnosis not present

## 2023-05-14 DIAGNOSIS — I10 Essential (primary) hypertension: Secondary | ICD-10-CM | POA: Diagnosis not present

## 2023-05-14 DIAGNOSIS — R079 Chest pain, unspecified: Secondary | ICD-10-CM | POA: Diagnosis not present

## 2023-05-14 DIAGNOSIS — I2510 Atherosclerotic heart disease of native coronary artery without angina pectoris: Secondary | ICD-10-CM | POA: Diagnosis not present

## 2023-05-14 DIAGNOSIS — E1159 Type 2 diabetes mellitus with other circulatory complications: Secondary | ICD-10-CM | POA: Diagnosis not present

## 2023-05-14 DIAGNOSIS — I442 Atrioventricular block, complete: Secondary | ICD-10-CM | POA: Diagnosis not present

## 2023-05-14 DIAGNOSIS — I480 Paroxysmal atrial fibrillation: Secondary | ICD-10-CM | POA: Diagnosis not present

## 2023-05-14 DIAGNOSIS — I5042 Chronic combined systolic (congestive) and diastolic (congestive) heart failure: Secondary | ICD-10-CM | POA: Diagnosis not present

## 2023-05-14 LAB — LIPID PANEL
Chol/HDL Ratio: 1.7 ratio (ref 0.0–4.4)
Cholesterol, Total: 106 mg/dL (ref 100–199)
HDL: 61 mg/dL (ref >39)
LDL Chol Calc (NIH): 26 mg/dL (ref 0–99)
Triglycerides: 104 mg/dL (ref 0–149)
VLDL Cholesterol Cal: 19 mg/dL (ref 5–40)

## 2023-05-14 LAB — TSH: TSH: 1.8 u[IU]/mL (ref 0.450–4.500)

## 2023-05-14 LAB — HEPATIC FUNCTION PANEL
ALT: 15 IU/L (ref 0–32)
AST: 13 IU/L (ref 0–40)
Albumin: 4.4 g/dL (ref 3.8–4.8)
Alkaline Phosphatase: 53 IU/L (ref 44–121)
Bilirubin Total: 0.5 mg/dL (ref 0.0–1.2)
Bilirubin, Direct: 0.21 mg/dL (ref 0.00–0.40)
Total Protein: 7.1 g/dL (ref 6.0–8.5)

## 2023-05-14 MED ORDER — CYANOCOBALAMIN 1000 MCG/ML IJ SOLN
1000.0000 ug | Freq: Once | INTRAMUSCULAR | Status: AC
  Administered 2023-05-14: 1000 ug via INTRAMUSCULAR

## 2023-05-14 NOTE — Progress Notes (Signed)
 Per orders of Dr. Ardyth Harps, injection of B12 given by Kern Reap. Patient tolerated injection well.

## 2023-05-22 ENCOUNTER — Other Ambulatory Visit: Payer: Self-pay | Admitting: Cardiovascular Disease

## 2023-05-22 NOTE — Progress Notes (Signed)
 Remote pacemaker transmission.

## 2023-06-11 ENCOUNTER — Other Ambulatory Visit: Payer: Self-pay | Admitting: Internal Medicine

## 2023-06-18 ENCOUNTER — Other Ambulatory Visit: Payer: Self-pay | Admitting: Cardiovascular Disease

## 2023-06-18 DIAGNOSIS — I480 Paroxysmal atrial fibrillation: Secondary | ICD-10-CM

## 2023-06-18 NOTE — Telephone Encounter (Signed)
 Xarelto 20mg  refill request received. Pt is 73 years old, weight-68.5kg, Crea-0.95 on 04/30/23, last seen by Reather Littler on 05/13/23, Diagnosis-Afib, CrCl- 58.74 mL/min; Dose is appropriate based on dosing criteria. Will send in refill to requested pharmacy.

## 2023-07-01 ENCOUNTER — Ambulatory Visit: Payer: Self-pay

## 2023-07-01 NOTE — Telephone Encounter (Signed)
  Chief Complaint: strep exposure Symptoms: sore throat, cough  Disposition: [] ED /[] Urgent Care (no appt availability in office) / [x] Appointment(In office/virtual)/ []  [place] [facility]/ [] Home Care/ [] Refused Recommended Disposition /[] [place] [facility]/ [x]  Follow-up with PCP Additional Notes: Pt calling with strep exposure. Pt's granddaughter test positive today and was placed on abx. Pt was hugging/kissing granddaughter all weekend. Pt now has sore throat and cough. Pt denies fever/ difficulty breathing. No office appt for tomorrow. Soonest  appt  was made for 4/24 @   1400 due to cardiologist appt earlier in day. RN advised pt to seek care at [facility] if symptoms worsen. If medication can be called in or appt moved up, please reach out to pt. RN gave care advice and pt verbalized understanding.           Reason for Disposition  [1] Strep throat EXPOSURE within past 10 days AND [2] sore throat  Answer Assessment - Initial Assessment Questions 1. STREP EXPOSURE: "Was the exposure to someone who lives within your home?" If not, ask: "How much contact did you have with the sick person?"      Granddaughter-loving on her  2. ONSET: "How many days ago did the contact occur?"      3 days  3. PROVEN STREP: "Are you sure the person with strep had a positive throat culture or rapid strep test?"      Rapid test 4. STREP SYMPTOMS: "Do YOU have a sore throat, fever, or other symptoms suggestive of strep?"      Yes, sore throat/cough 5. VIRAL SYMPTOMS: "Are there any symptoms of a cold, such as a runny nose, cough, hoarse voice?"     Hoarse, runny nose  Protocols used: Strep Throat Exposure-A-AH

## 2023-07-01 NOTE — Telephone Encounter (Signed)
 Called pt: no answer LVM to callback to [facility] at [facility] @ [phone]

## 2023-07-01 NOTE — Telephone Encounter (Signed)
 Reason for Triage: patient has strep throat and she is wanting to get an appt or give her some type of medication for it.

## 2023-07-02 NOTE — Telephone Encounter (Signed)
 Patient has an appt on 4/24 with Dr Arliss Lam.

## 2023-07-03 ENCOUNTER — Ambulatory Visit: Attending: Cardiovascular Disease | Admitting: Cardiovascular Disease

## 2023-07-03 ENCOUNTER — Ambulatory Visit: Admitting: Family Medicine

## 2023-07-03 ENCOUNTER — Encounter: Payer: Self-pay | Admitting: Cardiovascular Disease

## 2023-07-03 VITALS — BP 106/60 | HR 85 | Ht 64.0 in | Wt 151.8 lb

## 2023-07-03 DIAGNOSIS — E782 Mixed hyperlipidemia: Secondary | ICD-10-CM | POA: Diagnosis not present

## 2023-07-03 DIAGNOSIS — Z950 Presence of cardiac pacemaker: Secondary | ICD-10-CM

## 2023-07-03 DIAGNOSIS — I25118 Atherosclerotic heart disease of native coronary artery with other forms of angina pectoris: Secondary | ICD-10-CM | POA: Diagnosis not present

## 2023-07-03 DIAGNOSIS — I442 Atrioventricular block, complete: Secondary | ICD-10-CM

## 2023-07-03 DIAGNOSIS — I5032 Chronic diastolic (congestive) heart failure: Secondary | ICD-10-CM

## 2023-07-03 DIAGNOSIS — I10 Essential (primary) hypertension: Secondary | ICD-10-CM

## 2023-07-03 DIAGNOSIS — D6869 Other thrombophilia: Secondary | ICD-10-CM

## 2023-07-03 DIAGNOSIS — I480 Paroxysmal atrial fibrillation: Secondary | ICD-10-CM

## 2023-07-03 DIAGNOSIS — E1159 Type 2 diabetes mellitus with other circulatory complications: Secondary | ICD-10-CM | POA: Diagnosis not present

## 2023-07-03 MED ORDER — NITROGLYCERIN 0.4 MG SL SUBL: 0.4000 mg | SUBLINGUAL_TABLET | SUBLINGUAL | 3 refills | Status: AC | PRN

## 2023-07-03 MED ORDER — ISOSORBIDE MONONITRATE ER 60 MG PO TB24: 90.0000 mg | ORAL_TABLET | Freq: Every day | ORAL | 3 refills | Status: DC

## 2023-07-03 NOTE — Patient Instructions (Signed)
 Medication Instructions:  Increase Isosorbide  Mononitrate to 90 mg daily *If you need a refill on your cardiac medications before your next appointment, please call your pharmacy*   Follow-Up: At [facility], you and your health needs are our priority.  As part of our continuing mission to provide you with exceptional heart care, our providers are all part of one team.  This team includes your primary Cardiologist (physician) and Advanced Practice Providers or APPs (Physician Assistants and Nurse Practitioners) who all work together to provide you with the care you need, when you need it.  Your next appointment:   1 year(s)  Provider:   Luana Rumple, MD     We recommend signing up for the patient portal called "MyChart".  Sign up information is provided on this After Visit Summary.  MyChart is used to connect with patients for Virtual Visits (Telemedicine).  Patients are able to view lab/test results, encounter notes, upcoming appointments, etc.  Non-urgent messages can be sent to your provider as well.   To learn more about what you can do with MyChart, go to [email or web address].        1st Floor: - Lobby - Registration  - Pharmacy  - Lab - Cafe  2nd Floor: - PV Lab - Diagnostic Testing (echo, CT, nuclear med)  3rd Floor: - Vacant  4th Floor: - TCTS (cardiothoracic surgery) - AFib Clinic - Structural Heart Clinic - Vascular Surgery  - Vascular Ultrasound  5th Floor: - HeartCare Cardiology (general and EP) - Clinical Pharmacy for coumadin, hypertension, lipid, weight-loss medications, and med management appointments    Valet parking services will be available as well.

## 2023-07-03 NOTE — Progress Notes (Unsigned)
 Cardiology Office Note:    Date:  07/04/2023   ID:  [name], DOB [birth year], MRN [id number]  PCP:  [name], Michelle Coppersmith, MD  Cardiologist:  Michelle Rumple, MD    Referring MD: [name], Estel*   Chief Complaint  Patient presents with   Chest Pain   CRT-P  History of Present Illness:    [name] is a 73 y.o. female with a hx of CAD s/p CABG 2014, paroxysmal atrial fibrillation, CHF with recovery of EF after CRT-P (St. Jude), type 2 diabetes mellitus, hypercholesterolemia, hypertension returning for follow-up.  She is retired and finds herself working more around the house.  If she tries to rush too much she developed some retrosternal chest discomfort.  This resolves promptly with rest.  She has not had any unprovoked chest discomfort.  She denies dyspnea at rest or with activity, orthopnea, PND, lower extremity edema, palpitations,  syncope, focal neurological complaints, bleeding problems, falls or injuries.  Her blood pressure is relatively low, but she is asymptomatic.  She denies any dizziness or excessive fatigue.  Her Abbott Allure RF CRT-P device interrogation shows normal function.  She never needs atrial pacing and has 100% effective biventricular pacing.  All lead parameters are stable.  Presenting rhythm is atrial sensed, ventricular paced.  Estimated generator longevity is 3.4-3.9 years.  She is device dependent due to complete heart block.  She has not had any episodes of high ventricular rates or any recent atrial fibrillation.  She has not had any atrial fibrillation since September 2023.  Handful of episodes of 6 seconds paroxysmal atrial tachycardia since then.  Thoracic impedance is consistently in normal range.  Metabolic control is good.  Hemoglobin A1c most recently was 6.5% and LDL cholesterol 26, HDL 61.  She has normal creatinine at 0.95.  She just underwent functional workup due to her complaints of chest pain.  Her SPECT study showed  normal perfusion, no evidence of ischemia and EF 58%.  Her echocardiogram showed LVEF 50-55% and grade 2 diastolic dysfunction suggesting elevated left atrial filling pressures (E/e' was 15-20) and mild PAH (estimated systolic pressure 43 mmHg).  There were no serious valvular abnormalities.  ECG is nondiagnostic due to biventricular pacing (she had an LBBB before).  Nuclear stress test in February 2021 showed very minor abnormalities: A previously described fixed apical defect, and a possible small area of basal inferolateral ischemia.  EF was 51%.  She has not had repeat cardiac catheterization since her bypass surgery in 2014.  [name] presented with myocardial infarction in 2004. In May 2014 presented with congestive heart failure and atrial fibrillation in the setting of three-vessel coronary artery disease leading to bypass surgery (July 2014, Dr. Nicanor Barge, LIMA to LAD, SVG to ramus intermedius, sequential SVG to OM1 and OM 2, surgical maze procedure). She had persistent depressed left ventricular systolic function and received a dual-chamber biventricular pacemaker (St. Jude Allure, Dr. Carolynne Citron in November 2014).   Last echo February 2025 shows recovery of left ventricular ejection fraction 50-55%.  Nuclear stress test 04/28/2019.  Past Medical History:  Diagnosis Date   Acute on chronic combined systolic and diastolic CHF, NYHA class 3 (HCC) 03/19/2013   AICD (automatic cardioverter/defibrillator) present 01/2013   Biventricular cardiac pacemaker in situ    Allergic rhinitis    Anogenital warts 01/22/2011   Anxiety state, unspecified 05/13/2013   BREAST CANCER, HX OF 09/19/2009   Qualifier: Diagnosis of  By: Ena Harries MD, Viktoria Gray    CAD (  coronary artery disease)    a. 2004: s/p MI in [place] . No PCI->Medical RX;  b. 07/2012 Cath: LM 30-40, LAD 70p, 70/48m, D1 80-90p, OM1 small 90p, OM2 large 80-90p, 39m, 70-80d, RCA 20-30 diff, EF 40%, glob HK.s/p CABG   Cancer of left breast (HCC) 2002    Patient reports left breast cancer diagnosis in 2002 treated with bilateral mastectomy positive lymph nodes with left axillary dissection followed by chemotherapy of unknown type   Cataract    Chronic combined systolic and diastolic CHF, NYHA class 2 (HCC) 01/08/2013   Diabetes mellitus type II    Exertional dyspnea 08/05/2014   Exertional shortness of breath    Generalized osteoarthrosis, involving multiple sites 08/25/2015   History of colon polyps    Hyperlipidemia    Hypertension    Incisional hernia, without obstruction or gangrene 05/04/2015   Ischemic cardiomyopathy    a. 07/2012 Echo: EF 35%, Sev inferoseptal HK, mildly dil LA, Peak PASP .   LBBB (left bundle branch block)    a. intermittent - present during rapid afib 07/2012.   MYOCARDIAL INFARCTION, HX OF 09/19/2009   Qualifier: Diagnosis of  By: Ena Harries MD, Viktoria Gray    Neuromuscular disorder [facility])    Patient reports chronic numbness in the right foot related to previous surgery on the right leg and "nerve damage"   PAF (paroxysmal atrial fibrillation) (HCC)    a. 07/2012: Amio and xarelto  initiated.   Right carotid bruit 08/11/2012   SBO (small bowel obstruction) (HCC)    SOB (shortness of breath) 02/26/2018   Type 2 diabetes mellitus with circulatory disorder, without long-term current use of insulin  (HCC) 10/03/2015   Vitamin D  deficiency 02/22/2016    Past Surgical History:  Procedure Laterality Date   ABDOMINAL HYSTERECTOMY  2000   APPENDECTOMY  1974   BI-VENTRICULAR PACEMAKER INSERTION N/A 01/25/2013   Procedure: BI-VENTRICULAR PACEMAKER INSERTION (CRT-P);  Surgeon: Tammie Fall, MD;  Location: [facility] CATH LAB;  Service: Cardiovascular;  Laterality: N/A;   BREAST BIOPSY Left 2002   CARDIAC CATHETERIZATION     CHOLECYSTECTOMY OPEN  1974   CORONARY ARTERY BYPASS GRAFT N/A 09/28/2012   Procedure: CORONARY ARTERY BYPASS GRAFTING (CABG);  Surgeon: Norita Beauvais, MD;  Location: [facility] OR;  Service: Open Heart Surgery;   Laterality: N/A;  CABG x four, using left internal mammary artery and left leg greater saphenous vein harvested endoscopically   DILATION AND CURETTAGE OF UTERUS  1983   EPICARDIAL PACING LEAD PLACEMENT N/A 09/28/2012   Procedure: EPICARDIAL PACING LEAD PLACEMENT;  Surgeon: Norita Beauvais, MD;  Location: [facility] OR;  Service: Thoracic;  Laterality: N/A;  LV LEAD PLACEMENT   HERNIA REPAIR     INCISIONAL HERNIA REPAIR N/A 05/25/2015   Procedure: LAPAROSCOPIC INCISIONAL HERNIA WITH MESH ;  Surgeon: Enid Harry, MD;  Location: [facility] OR;  Service: General;  Laterality: N/A;   INCONTINENCE SURGERY  2000   INSERTION OF MESH N/A 05/25/2015   Procedure: INSERTION OF MESH;  Surgeon: Enid Harry, MD;  Location: [facility] OR;  Service: General;  Laterality: N/A;   INTRAOPERATIVE TRANSESOPHAGEAL ECHOCARDIOGRAM N/A 09/28/2012   Procedure: INTRAOPERATIVE TRANSESOPHAGEAL ECHOCARDIOGRAM;  Surgeon: Norita Beauvais, MD;  Location[facility] OR;  Service: Open Heart Surgery;  Laterality: N/A;   LAPAROSCOPIC INCISIONAL / UMBILICAL / VENTRAL HERNIA REPAIR  05/25/2015   IHR   LEFT HEART CATHETERIZATION WITH CORONARY ANGIOGRAM N/A 07/20/2012   Procedure: LEFT HEART CATHETERIZATION WITH CORONARY ANGIOGRAM;  Surgeon: Peter M Swaziland, MD;  Location: [facility] CATH LAB;  Service: Cardiovascular;  Laterality: N/A;   MASTECTOMY Right 2002   MASTECTOMY MODIFIED RADICAL W/ AXILLARY LYMPH NODES W/ OR W/O PECTORALIS MINOR Left 2002   MAZE N/A 09/28/2012   Procedure: MAZE;  Surgeon: Norita Beauvais, MD;  Location: [facility] OR;  Service: Open Heart Surgery;  Laterality: N/A;   ORIF WRIST FRACTURE Right 11/08/2017   Procedure: OPEN REDUCTION INTERNAL FIXATION (ORIF) WRIST FRACTURE;  Surgeon: Ronn Cohn, MD;  Location: [facility] OR;  Service: Orthopedics;  Laterality: Right;  90 mins   PPM GENERATOR CHANGEOUT N/A 04/17/2020   Procedure: PPM GENERATOR CHANGEOUT;  Surgeon: Michelle Rumple, MD;  Location: [facility] INVASIVE CV LAB;  Service: Cardiovascular;   Laterality: N/A;   TENDON REPAIR Right 2001 X 3-4   torn ligaments and tendons in ankle up to knee from work related accident   TUBAL LIGATION  1987    Current Medications: Current Meds  Medication Sig   APPLE CIDER VINEGAR PO Take 5,220 mg by mouth in the morning, at noon, and at bedtime. 1740 mg each   carvedilol  (COREG ) 12.5 MG tablet TAKE 2 TABLETS TWICE A DAY   Cholecalciferol (VITAMIN D3) 50 MCG (2000 UT) capsule TAKE 1 CAPSULE (2000 UNITS TOTAL) BY MOUTH DAILY.   Cyanocobalamin  2000 MCG/ML SOLN Inject 1,000 mg as directed every 30 (thirty) days. Inject on the 21st of each  month   Dulaglutide  (TRULICITY ) 1.5 MG/0.5ML SOPN INJECT 1.5MG  (1 PEN) UNDER THE SKIN EVERY 7 DAYS   furosemide  (LASIX ) 20 MG tablet TAKE 1 TABLET EVERY DAY   glipiZIDE  (GLUCOTROL ) 5 MG tablet TAKE 1 TABLET TWICE DAILY BEFORE MEALS   ibuprofen (ADVIL) 200 MG tablet Take 400 mg by mouth every 6 (six) hours as needed for mild pain (pain score 1-3) or moderate pain (pain score 4-6) (low back, hip, and upper leg).   JARDIANCE  25 MG TABS tablet TAKE 1 TABLET EVERY DAY BEFORE BREAKFAST   losartan  (COZAAR ) 25 MG tablet Take 0.5 tablets (12.5 mg total) by mouth daily.   metFORMIN  (GLUCOPHAGE ) 1000 MG tablet TAKE 1 TABLET TWICE DAILY WITH FOOD   REPATHA  SURECLICK 140 MG/ML SOAJ INJECT 140 MG INTO THE SKIN EVERY 14 (FOURTEEN) DAYS.   rivaroxaban  (XARELTO ) 20 MG TABS tablet TAKE 1 TABLET EVERY DAY WITH SUPPER   [DISCONTINUED] isosorbide  mononitrate (IMDUR ) 60 MG 24 hr tablet TAKE 1 TABLET EVERY DAY   [DISCONTINUED] nitroGLYCERIN  (NITROSTAT ) 0.4 MG SL tablet Place 1 tablet (0.4 mg total) under the tongue every 5 (five) minutes as needed for chest pain.     Allergies:   Statins Family History: The patient's family history includes Arthritis in an other family member; Colon cancer in her sister; Diabetes in her mother and another family member; Heart disease in her mother; Hyperlipidemia in an other family member; Kidney  failure in her mother; Liver cancer in her brother; Ovarian cancer in an other family member. There is no history of Stomach cancer, Rectal cancer, Esophageal cancer, or Pancreatic cancer.    EKGs/Labs/Other Studies Reviewed:   Nuclear stress test 05/02/2023: LV perfusion is normal. There is no evidence of ischemia. There is no evidence of infarction.   Left ventricular function is normal. Nuclear stress EF: 58%. End diastolic cavity size is normal. End systolic cavity size is normal. No evidence of transient ischemic dilation (TID) noted.   Prior study available for comparison from 04/28/2019.   The study is normal. The study is low risk.  Echocardiogram 05/01/2023:  1. Left ventricular ejection fraction, by estimation, is 50 to 55%. The  left ventricle has low normal function. The left ventricle demonstrates  regional wall motion abnormalities (see scoring diagram/findings for  description). Left ventricular diastolic   parameters are consistent with Grade II diastolic dysfunction  (pseudonormalization).   2. Right ventricular systolic function is normal. The right ventricular  size is normal. There is mildly elevated pulmonary artery systolic  pressure. The estimated right ventricular systolic pressure is 43.0 mmHg.   3. The mitral valve is grossly normal. Mild mitral valve regurgitation.  No evidence of mitral stenosis.   4. The aortic valve is grossly normal. Aortic valve regurgitation is  trivial. No aortic stenosis is present.   5. The inferior vena cava is dilated in size with >50% respiratory  variability, suggesting right atrial pressure of 8 mmHg.   Comparison(s): No significant change from prior study. Prior images  reviewed side by side.   EKG:  Personally reviewed her most recent tracing from 04/30/2023 which shows atrial sensed (sinus), biventricular paced rhythm, with a very distinct positive R wave in lead V1.  QRS 142 ms, QTc 556 ms  EKG  Interpretation Date/Time:    Ventricular Rate:    PR Interval:    QRS Duration:    QT Interval:    QTC Calculation:   R Axis:      Text Interpretation:          Recent Labs: 04/30/2023: BUN 21; Creatinine, Ser 0.95; Hemoglobin 13.2; Platelets 220; Potassium 4.1; Sodium 135 05/14/2023: ALT 15; TSH 1.800  Recent Lipid Panel    Component Value Date/Time   CHOL 106 05/14/2023 0925   TRIG 104 05/14/2023 0925   HDL 61 05/14/2023 0925   CHOLHDL 1.7 05/14/2023 0925   CHOLHDL 5.2 (H) 04/04/2016 0723   VLDL NOT CALC 04/04/2016 0723   LDLCALC 26 05/14/2023 0925   LDLDIRECT 50 04/04/2016 0723    Physical Exam:    VS:  BP 106/60 (BP Location: Right Arm, Patient Position: Sitting, Cuff Size: Normal)   Pulse 85   Ht 5\' 4"  (1.626 m)   Wt 151 lb 12.8 oz (68.9 kg)   SpO2 97%   BMI 26.06 kg/m     Wt Readings from Last 3 Encounters:  07/03/23 151 lb 12.8 oz (68.9 kg)  05/13/23 151 lb (68.5 kg)  05/02/23 150 lb (68 kg)     General: Alert, oriented x3, no distress, minimally overweight Head: no evidence of trauma, PERRL, EOMI, no exophtalmos or lid lag, no myxedema, no xanthelasma; normal ears, nose and oropharynx Neck: normal jugular venous pulsations and no hepatojugular reflux; brisk carotid pulses without delay and no carotid bruits Chest: clear to auscultation, no signs of consolidation by percussion or palpation, normal fremitus, symmetrical and full respiratory excursions Cardiovascular: normal position and quality of the apical impulse, regular rhythm, normal first and second heart sounds, no murmurs, rubs or gallops Abdomen: no tenderness or distention, no masses by palpation, no abnormal pulsatility or arterial bruits, normal bowel sounds, no hepatosplenomegaly Extremities: no clubbing, cyanosis or edema; 2+ radial, ulnar and brachial pulses bilaterally; 2+ right femoral, posterior tibial and dorsalis pedis pulses; 2+ left femoral, posterior tibial and dorsalis pedis pulses; no  subclavian or femoral bruits Neurological: grossly nonfocal Psych: Normal mood and affect  ASSESSMENT:    1. Coronary artery disease of native artery of native heart with stable angina pectoris (HCC)   2. Chronic diastolic heart failure (HCC)   3. CHB (complete  heart block) (HCC)   4. Type 2 diabetes mellitus with other circulatory complication, without long-term current use of insulin  (HCC)   5. PAF (paroxysmal atrial fibrillation) (HCC)   6. Acquired thrombophilia (HCC)   7. Mixed hyperlipidemia   8. Essential hypertension   9. Biventricular cardiac pacemaker in situ       PLAN:    In order of problems listed above:  CAD s/p CABG: Has some symptoms suggesting exertional angina, but had a low risk nuclear stress test in February.  Current chest complaints are somewhat different from her preoperative angina (was "like getting MSG in my [place] food"), but seem to be consistently associated with greater than usual physical activity.  Recent low risk nuclear perfusion study, no indication for invasive evaluation at this time.  Past episodes of abrupt onset and unpredictable chest discomfort appeared to improve following initiation of treatment with long-acting nitrates.  As before, I am not sure whether this is because she has coronary spasm or esophageal spasm.  Will increase the isosorbide  to 90 mg daily.   CHF: CRT-P hyper responder with complete normalization of LVEF.  NYHA functional class I.  Clinically euvolemic, OptiVol in normal range, but echo in February did suggest that she had some volume overload.   Prior to CRT-P LVEF was 35-40% and she had a lot of problems with heart failure exacerbation, but following resynchronization therapy EF is normal and she has not had problems with heart failure in a very long time.  On ARB, carvedilol  and SGLT2 inhibitor, very low-dose of loop diuretic.  Blood pressure is borderline low, but she has no symptoms of hypotension. CHB: Pacemaker  dependent. OSA: Mild by sleep study performed November 2023. DM: Good control with hemoglobin A1c 6.5%. AFib: Prevalence remains very low, although not 0 since the MAZE procedure at the time of bypass.  Has not had sustained atrial fibrillation since late 2023.  Continue anticoagulation. CHADSVasc 70 (age, gender,  CHF, CAD, DM, HTN). Xarelto : Well-tolerated, without any bleeding problems. HLP: All lipid parameters are excellent including LDL 26.  Had statin myopathy, but has excellent response to Repatha . HTN: Blood pressure now in low normal range on heart failure medications. CRT-P: Normal device function on comprehensive check in the office today.  Continue remote downloads every 3 months.    Patient Instructions  Medication Instructions:  Increase Isosorbide  Mononitrate to 90 mg daily *If you need a refill on your cardiac medications before your next appointment, please call your pharmacy*   Follow-Up: At [facility], you and your health needs are our priority.  As part of our continuing mission to provide you with exceptional heart care, our providers are all part of one team.  This team includes your primary Cardiologist (physician) and Advanced Practice Providers or APPs (Physician Assistants and Nurse Practitioners) who all work together to provide you with the care you need, when you need it.  Your next appointment:   1 year(s)  Provider:   Luana Rumple, MD     We recommend signing up for the patient portal called "MyChart".  Sign up information is provided on this After Visit Summary.  MyChart is used to connect with patients for Virtual Visits (Telemedicine).  Patients are able to view lab/test results, encounter notes, upcoming appointments, etc.  Non-urgent messages can be sent to your provider as well.   To learn more about what you can do with MyChart, go to [email or web address].        1st Floor: -  Lobby - Registration  - Pharmacy  - Lab -  Cafe  2nd Floor: - PV Lab - Diagnostic Testing (echo, CT, nuclear med)  3rd Floor: - Vacant  4th Floor: - TCTS (cardiothoracic surgery) - AFib Clinic - Structural Heart Clinic - Vascular Surgery  - Vascular Ultrasound  5th Floor: - HeartCare Cardiology (general and EP) - Clinical Pharmacy for coumadin, hypertension, lipid, weight-loss medications, and med management appointments    Valet parking services will be available as well.     . Medication Adjustments/Labs and Tests Ordered: Current medicines are reviewed at length with the patient today.  Concerns regarding medicines are outlined above.  No orders of the defined types were placed in this encounter.  Meds ordered this encounter  Medications   isosorbide  mononitrate (IMDUR ) 60 MG 24 hr tablet    Sig: Take 1.5 tablets (90 mg total) by mouth daily.    Dispense:  120 tablet    Refill:  3   nitroGLYCERIN  (NITROSTAT ) 0.4 MG SL tablet    Sig: Place 1 tablet (0.4 mg total) under the tongue every 5 (five) minutes as needed for chest pain.    Dispense:  25 tablet    Refill:  3   Patient Instructions  Medication Instructions:  Increase Isosorbide  Mononitrate to 90 mg daily *If you need a refill on your cardiac medications before your next appointment, please call your pharmacy*   Follow-Up: At [facility], you and your health needs are our priority.  As part of our continuing mission to provide you with exceptional heart care, our providers are all part of one team.  This team includes your primary Cardiologist (physician) and Advanced Practice Providers or APPs (Physician Assistants and Nurse Practitioners) who all work together to provide you with the care you need, when you need it.  Your next appointment:   1 year(s)  Provider:   Luana Rumple, MD     We recommend signing up for the patient portal called "MyChart".  Sign up information is provided on this After Visit Summary.  MyChart is used to  connect with patients for Virtual Visits (Telemedicine).  Patients are able to view lab/test results, encounter notes, upcoming appointments, etc.  Non-urgent messages can be sent to your provider as well.   To learn more about what you can do with MyChart, go to [email or web address].        1st Floor: - Lobby - Registration  - Pharmacy  - Lab - Cafe  2nd Floor: - PV Lab - Diagnostic Testing (echo, CT, nuclear med)  3rd Floor: - Vacant  4th Floor: - TCTS (cardiothoracic surgery) - AFib Clinic - Structural Heart Clinic - Vascular Surgery  - Vascular Ultrasound  5th Floor: - HeartCare Cardiology (general and EP) - Clinical Pharmacy for coumadin, hypertension, lipid, weight-loss medications, and med management appointments    Valet parking services will be available as well.      Signed, Michelle Rumple, MD  07/04/2023 5:29 PM    [facility]

## 2023-07-04 ENCOUNTER — Encounter: Payer: Self-pay | Admitting: Cardiovascular Disease

## 2023-07-07 ENCOUNTER — Other Ambulatory Visit: Payer: Self-pay

## 2023-07-07 MED ORDER — ISOSORBIDE MONONITRATE ER 60 MG PO TB24: 90.0000 mg | ORAL_TABLET | Freq: Every day | ORAL | 3 refills | Status: AC

## 2023-07-08 ENCOUNTER — Encounter: Payer: Self-pay | Admitting: Internal Medicine

## 2023-07-08 DIAGNOSIS — H9190 Unspecified hearing loss, unspecified ear: Secondary | ICD-10-CM

## 2023-07-14 ENCOUNTER — Encounter: Payer: Self-pay | Admitting: Internal Medicine

## 2023-07-14 ENCOUNTER — Other Ambulatory Visit: Payer: Self-pay | Admitting: Internal Medicine

## 2023-07-14 ENCOUNTER — Other Ambulatory Visit: Payer: Self-pay | Admitting: Cardiovascular Disease

## 2023-07-14 DIAGNOSIS — I10 Essential (primary) hypertension: Secondary | ICD-10-CM

## 2023-07-14 DIAGNOSIS — E782 Mixed hyperlipidemia: Secondary | ICD-10-CM

## 2023-07-14 DIAGNOSIS — I252 Old myocardial infarction: Secondary | ICD-10-CM

## 2023-07-14 DIAGNOSIS — E1159 Type 2 diabetes mellitus with other circulatory complications: Secondary | ICD-10-CM

## 2023-07-15 ENCOUNTER — Telehealth: Payer: Self-pay | Admitting: Cardiovascular Disease

## 2023-07-15 NOTE — Telephone Encounter (Signed)
 Paper Work Dropped Off: Clearance for dental procedure  Date: 07/15/2023  Location of paper: Dr. Alvis Ba mail box

## 2023-07-17 ENCOUNTER — Encounter (INDEPENDENT_AMBULATORY_CARE_PROVIDER_SITE_OTHER): Payer: Self-pay | Admitting: Otolaryngology

## 2023-07-17 ENCOUNTER — Ambulatory Visit (INDEPENDENT_AMBULATORY_CARE_PROVIDER_SITE_OTHER): Payer: Medicare HMO

## 2023-07-17 DIAGNOSIS — I442 Atrioventricular block, complete: Secondary | ICD-10-CM

## 2023-07-17 LAB — CUP PACEART REMOTE DEVICE CHECK
Battery Remaining Longevity: 44 mo
Battery Remaining Percentage: 52 %
Battery Voltage: 2.98 V
Brady Statistic AP VP Percent: 1 %
Brady Statistic AP VS Percent: 0 %
Brady Statistic AS VP Percent: 99 %
Brady Statistic AS VS Percent: 1 %
Brady Statistic RA Percent Paced: 1 %
Date Time Interrogation Session: 20250508040015
Implantable Lead Connection Status: 753985
Implantable Lead Connection Status: 753985
Implantable Lead Connection Status: 753985
Implantable Lead Implant Date: 20140721
Implantable Lead Implant Date: 20141117
Implantable Lead Implant Date: 20141117
Implantable Lead Location: 753858
Implantable Lead Location: 753859
Implantable Lead Location: 753860
Implantable Lead Model: 5071
Implantable Pulse Generator Implant Date: 20220207
Lead Channel Impedance Value: 400 Ohm
Lead Channel Impedance Value: 400 Ohm
Lead Channel Impedance Value: 580 Ohm
Lead Channel Pacing Threshold Amplitude: 0.5 V
Lead Channel Pacing Threshold Amplitude: 1 V
Lead Channel Pacing Threshold Amplitude: 1.25 V
Lead Channel Pacing Threshold Pulse Width: 0.4 ms
Lead Channel Pacing Threshold Pulse Width: 0.4 ms
Lead Channel Pacing Threshold Pulse Width: 0.5 ms
Lead Channel Sensing Intrinsic Amplitude: 0.5 mV
Lead Channel Sensing Intrinsic Amplitude: 8.4 mV
Lead Channel Setting Pacing Amplitude: 2 V
Lead Channel Setting Pacing Amplitude: 2.5 V
Lead Channel Setting Pacing Amplitude: 2.5 V
Lead Channel Setting Pacing Pulse Width: 0.4 ms
Lead Channel Setting Pacing Pulse Width: 0.5 ms
Lead Channel Setting Sensing Sensitivity: 2 mV
Pulse Gen Model: 3222
Pulse Gen Serial Number: 3859407

## 2023-07-17 NOTE — Telephone Encounter (Signed)
 Yes, done yesterday. Gave it to [facility] to fax.

## 2023-07-20 ENCOUNTER — Encounter: Payer: Self-pay | Admitting: Cardiovascular Disease

## 2023-07-23 ENCOUNTER — Encounter: Payer: Self-pay | Admitting: Cardiovascular Disease

## 2023-08-07 ENCOUNTER — Encounter: Payer: Self-pay | Admitting: Internal Medicine

## 2023-08-12 ENCOUNTER — Ambulatory Visit: Admitting: Internal Medicine

## 2023-08-22 NOTE — Progress Notes (Signed)
 Remote pacemaker transmission.

## 2023-08-26 ENCOUNTER — Other Ambulatory Visit: Payer: Self-pay | Admitting: Internal Medicine

## 2023-09-18 ENCOUNTER — Institutional Professional Consult (permissible substitution) (INDEPENDENT_AMBULATORY_CARE_PROVIDER_SITE_OTHER): Admitting: Otolaryngology

## 2023-09-18 ENCOUNTER — Ambulatory Visit (INDEPENDENT_AMBULATORY_CARE_PROVIDER_SITE_OTHER): Admitting: Audiology

## 2023-10-16 ENCOUNTER — Ambulatory Visit: Payer: Medicare HMO

## 2023-10-16 DIAGNOSIS — I442 Atrioventricular block, complete: Secondary | ICD-10-CM | POA: Diagnosis not present

## 2023-10-16 LAB — CUP PACEART REMOTE DEVICE CHECK
Battery Remaining Longevity: 41 mo
Battery Remaining Percentage: 48 %
Battery Voltage: 2.96 V
Brady Statistic AP VP Percent: 1 %
Brady Statistic AP VS Percent: 0 %
Brady Statistic AS VP Percent: 99 %
Brady Statistic AS VS Percent: 1 %
Brady Statistic RA Percent Paced: 1 %
Date Time Interrogation Session: 20250806070246
Implantable Lead Connection Status: 753985
Implantable Lead Connection Status: 753985
Implantable Lead Connection Status: 753985
Implantable Lead Implant Date: 20140721
Implantable Lead Implant Date: 20141117
Implantable Lead Implant Date: 20141117
Implantable Lead Location: 753858
Implantable Lead Location: 753859
Implantable Lead Location: 753860
Implantable Lead Model: 5071
Implantable Pulse Generator Implant Date: 20220207
Lead Channel Impedance Value: 400 Ohm
Lead Channel Impedance Value: 430 Ohm
Lead Channel Impedance Value: 540 Ohm
Lead Channel Pacing Threshold Amplitude: 0.5 V
Lead Channel Pacing Threshold Amplitude: 1 V
Lead Channel Pacing Threshold Amplitude: 1.25 V
Lead Channel Pacing Threshold Pulse Width: 0.4 ms
Lead Channel Pacing Threshold Pulse Width: 0.4 ms
Lead Channel Pacing Threshold Pulse Width: 0.5 ms
Lead Channel Sensing Intrinsic Amplitude: 0.5 mV
Lead Channel Sensing Intrinsic Amplitude: 8.4 mV
Lead Channel Setting Pacing Amplitude: 2 V
Lead Channel Setting Pacing Amplitude: 2.5 V
Lead Channel Setting Pacing Amplitude: 2.5 V
Lead Channel Setting Pacing Pulse Width: 0.4 ms
Lead Channel Setting Pacing Pulse Width: 0.5 ms
Lead Channel Setting Sensing Sensitivity: 2 mV
Pulse Gen Model: 3222
Pulse Gen Serial Number: 3859407

## 2023-10-17 ENCOUNTER — Ambulatory Visit: Payer: Self-pay | Admitting: Cardiovascular Disease

## 2023-10-22 ENCOUNTER — Other Ambulatory Visit: Payer: Self-pay | Admitting: Medical Genetics

## 2023-10-27 DIAGNOSIS — H35033 Hypertensive retinopathy, bilateral: Secondary | ICD-10-CM | POA: Diagnosis not present

## 2023-10-27 DIAGNOSIS — H35372 Puckering of macula, left eye: Secondary | ICD-10-CM | POA: Diagnosis not present

## 2023-10-27 DIAGNOSIS — D3132 Benign neoplasm of left choroid: Secondary | ICD-10-CM | POA: Diagnosis not present

## 2023-10-27 DIAGNOSIS — E113393 Type 2 diabetes mellitus with moderate nonproliferative diabetic retinopathy without macular edema, bilateral: Secondary | ICD-10-CM | POA: Diagnosis not present

## 2023-10-27 LAB — HM DIABETES EYE EXAM

## 2023-11-04 ENCOUNTER — Telehealth: Admitting: Physician Assistant

## 2023-11-04 DIAGNOSIS — T7840XA Allergy, unspecified, initial encounter: Secondary | ICD-10-CM

## 2023-11-04 MED ORDER — HYDROXYZINE HCL 10 MG PO TABS: 10.0000 mg | ORAL_TABLET | Freq: Three times a day (TID) | ORAL | 0 refills | Status: AC | PRN

## 2023-11-04 MED ORDER — TRIAMCINOLONE ACETONIDE 0.1 % EX CREA: 1.0000 | TOPICAL_CREAM | Freq: Two times a day (BID) | CUTANEOUS | 0 refills | Status: AC

## 2023-11-04 NOTE — Patient Instructions (Signed)
 Edra Holmes, thank you for joining Elsie Velma Lunger, PA-C for today's virtual visit.  While this provider is not your primary care provider (PCP), if your PCP is located in our provider database this encounter information will be shared with them immediately following your visit.   A [facility] MyChart account gives you access to today's visit and all your visits, tests, and labs performed at [facility]  click here if you don't have a [facility] MyChart account or go to [email or web address]  Consent: (Patient) Dreya Buhrman provided verbal consent for this virtual visit at the beginning of the encounter.  Current Medications:  Current Outpatient Medications:    acetaminophen  (TYLENOL ) 325 MG tablet, Take 325 mg by mouth as needed for mild pain (pain score 1-3) (low back, hip, and upper leg pain). (Patient not taking: Reported on 07/03/2023), Disp: , Rfl:    APPLE CIDER VINEGAR PO, Take 5,220 mg by mouth in the morning, at noon, and at bedtime. 1740 mg each, Disp: , Rfl:    carvedilol  (COREG ) 12.5 MG tablet, TAKE 2 TABLETS TWICE A DAY, Disp: 360 tablet, Rfl: 3   Cholecalciferol (VITAMIN D3) 50 MCG (2000 UT) capsule, TAKE 1 CAPSULE (2000 UNITS TOTAL) BY MOUTH DAILY., Disp: 90 capsule, Rfl: 3   Cyanocobalamin  2000 MCG/ML SOLN, Inject 1,000 mg as directed every 30 (thirty) days. Inject on the 21st of each  month, Disp: , Rfl:    Dulaglutide  (TRULICITY ) 1.5 MG/0.5ML SOPN, INJECT 1.5MG  (1 PEN) UNDER THE SKIN EVERY 7 DAYS, Disp: 6 mL, Rfl: 3   empagliflozin  (JARDIANCE ) 25 MG TABS tablet, TAKE 1 TABLET EVERY DAY BEFORE BREAKFAST, Disp: 90 tablet, Rfl: 1   furosemide  (LASIX ) 20 MG tablet, TAKE 1 TABLET EVERY DAY, Disp: 90 tablet, Rfl: 2   glipiZIDE  (GLUCOTROL ) 5 MG tablet, TAKE 1 TABLET TWICE DAILY BEFORE MEALS, Disp: 180 tablet, Rfl: 1   ibuprofen (ADVIL) 200 MG tablet, Take 400 mg by mouth every 6 (six) hours as needed for mild pain (pain score 1-3) or moderate pain (pain score  4-6) (low back, hip, and upper leg)., Disp: , Rfl:    isosorbide  mononitrate (IMDUR ) 60 MG 24 hr tablet, Take 1.5 tablets (90 mg total) by mouth daily., Disp: 135 tablet, Rfl: 3   losartan  (COZAAR ) 25 MG tablet, Take 0.5 tablets (12.5 mg total) by mouth daily., Disp: 90 tablet, Rfl: 3   metFORMIN  (GLUCOPHAGE ) 1000 MG tablet, TAKE 1 TABLET TWICE DAILY WITH FOOD, Disp: 180 tablet, Rfl: 1   nitroGLYCERIN  (NITROSTAT ) 0.4 MG SL tablet, Place 1 tablet (0.4 mg total) under the tongue every 5 (five) minutes as needed for chest pain., Disp: 25 tablet, Rfl: 3   REPATHA  SURECLICK 140 MG/ML SOAJ, INJECT 140MG  UNDER THE SKIN EVERY 2 WEEKS, Disp: 6 mL, Rfl: 3   rivaroxaban  (XARELTO ) 20 MG TABS tablet, TAKE 1 TABLET EVERY DAY WITH SUPPER, Disp: 90 tablet, Rfl: 1   Medications ordered in this encounter:  No orders of the defined types were placed in this encounter.    *If you need refills on other medications prior to your next appointment, please contact your pharmacy*  Follow-Up: Call back or seek an in-person evaluation if the symptoms worsen or if the condition fails to improve as anticipated.   Virtual Care [phone]  Other Instructions Please wash bed linens in hot water alone and dry before reuse.  Keep symptom journal so we can assess for any potential triggers. May be beneficial to make sure the pets  are bathed as a precaution. Start topical triamcinolone  to neck and forehead, avoid use anywhere else on the face. I have sent in a low-dose Hydroxyzine  (giving age > 3) to help calm this reaction down. If it makes you too drowsy, stop it and let us  or your PCP know. If you note any non-resolving, new, or worsening symptoms despite treatment, please seek an in-person evaluation ASAP.   If you have been instructed to have an in-person evaluation today at a local Urgent Care facility, please use the link below. It will take you to a list of all of our available [facility], including address, phone number and hours of operation. Please do not delay care.  [facility]  If you or a family member do not have a primary care provider, use the link below to schedule a visit and establish care. When you choose a [facility] primary care physician or advanced practice provider, you gain a long-term partner in health. Find a Primary Care Provider  Learn more about [facility] in-office and virtual care options: [facility] - Get Care Now

## 2023-11-04 NOTE — Progress Notes (Signed)
 Virtual Visit Consent   [name], you are scheduled for a virtual visit with a [facility] provider today. Just as with appointments in the office, your consent must be obtained to participate. Your consent will be active for this visit and any virtual visit you may have with one of our providers in the next 365 days. If you have a MyChart account, a copy of this consent can be sent to you electronically.  As this is a virtual visit, video technology does not allow for your provider to perform a traditional examination. This may limit your provider's ability to fully assess your condition. If your provider identifies any concerns that need to be evaluated in person or the need to arrange testing (such as labs, EKG, etc.), we will make arrangements to do so. Although advances in technology are sophisticated, we cannot ensure that it will always work on either your end or our end. If the connection with a video visit is poor, the visit may have to be switched to a telephone visit. With either a video or telephone visit, we are not always able to ensure that we have a secure connection.  By engaging in this virtual visit, you consent to the provision of healthcare and authorize for your insurance to be billed (if applicable) for the services provided during this visit. Depending on your insurance coverage, you may receive a charge related to this service.  I need to obtain your verbal consent now. Are you willing to proceed with your visit today? [name] has provided verbal consent on 11/04/2023 for a virtual visit (video or telephone). [name], [place]  Date: 11/04/2023 10:56 AM   Virtual Visit via Video Note   I, [name], connected with  [name]  ([id number], 29-Aug-1950) on 11/04/23 at 10:30 AM EDT by a video-enabled telemedicine application and verified that I am speaking with the correct person using two identifiers.  Location: Patient: Virtual Visit Location  Patient: Home Provider: Virtual Visit Location Provider: Home Office   I discussed the limitations of evaluation and management by telemedicine and the availability of in person appointments. The patient expressed understanding and agreed to proceed.    History of Present Illness: [name] is a 73 y.o. who identifies as a female who was assigned female at birth, and is being seen today for facial itching and rash over past 2 days. Initially with itching around her hairline and her forehead, now presenting with some bumpy rash and swelling of forehead. Denies pain, just pruritus. Denies fever, chills, SOB or tongue swelling. Denies recent travel or sick contact. Denies known change to soaps, lotions, detergents. Three little Chihuahuas -- recent vet visit and healthy.    OTC -- rubbing alcohol, wash cloth.   HPI: HPI  Problems:  Patient Active Problem List   Diagnosis Date Noted   Chest pain 05/01/2023   Vitamin B12 deficiency 09/17/2022   Statin myopathy 07/08/2022   History of colonic polyps 07/20/2021   Pacemaker battery depletion 04/17/2020   SOB (shortness of breath) 02/26/2018   Vitamin D  deficiency 02/22/2016   Current use of long term anticoagulation 11/10/2015   Type 2 diabetes mellitus with circulatory disorder, without long-term current use of insulin  (HCC) 10/03/2015   Chronic pain disorder 10/03/2015   Generalized osteoarthrosis, involving multiple sites 08/25/2015   Other fatigue 08/11/2015   Back pain, chronic 08/11/2015   Incisional hernia 05/25/2015   Incisional hernia, without obstruction or gangrene 05/04/2015   Exertional dyspnea  08/05/2014   Routine general medical examination at a health care facility 04/14/2014   Anxiety state 05/13/2013   Acute on chronic combined systolic and diastolic CHF, NYHA class 3 (HCC) 03/19/2013   High anion gap metabolic acidosis 03/14/2013   Biventricular cardiac pacemaker - [employer], Nov 2014 03/04/2013   Chronic combined  systolic and diastolic CHF, NYHA class 2 (HCC) 01/08/2013   S/P CABG x 4: 09/28/12 (LIMA-LAD, SVG-OM1-OM2, SVG-Intermediate) 09/29/2012   Right carotid bruit 08/11/2012   PAF (paroxysmal atrial fibrillation) (HCC) 07/21/2012   Ischemic cardiomyopathy    CAD (coronary artery disease)    Anogenital warts 01/22/2011   Hyperlipidemia 09/19/2009   Essential hypertension 09/19/2009   MYOCARDIAL INFARCTION, HX OF 09/19/2009   Allergic rhinitis 09/19/2009   BREAST CANCER, HX OF 09/19/2009    Allergies:  Allergies  Allergen Reactions   Statins Itching    Myalgias with rosuvastatin , atorvastatin  and simvastatin     Medications:  Current Outpatient Medications:    hydrOXYzine  (ATARAX ) 10 MG tablet, Take 1 tablet (10 mg total) by mouth 3 (three) times daily as needed., Disp: 30 tablet, Rfl: 0   triamcinolone  cream (KENALOG ) 0.1 %, Apply 1 Application topically 2 (two) times daily., Disp: 30 g, Rfl: 0   acetaminophen  (TYLENOL ) 325 MG tablet, Take 325 mg by mouth as needed for mild pain (pain score 1-3) (low back, hip, and upper leg pain). (Patient not taking: Reported on 07/03/2023), Disp: , Rfl:    APPLE CIDER VINEGAR PO, Take 5,220 mg by mouth in the morning, at noon, and at bedtime. 1740 mg each, Disp: , Rfl:    carvedilol  (COREG ) 12.5 MG tablet, TAKE 2 TABLETS TWICE A DAY, Disp: 360 tablet, Rfl: 3   Cholecalciferol (VITAMIN D3) 50 MCG (2000 UT) capsule, TAKE 1 CAPSULE (2000 UNITS TOTAL) BY MOUTH DAILY., Disp: 90 capsule, Rfl: 3   Cyanocobalamin  2000 MCG/ML SOLN, Inject 1,000 mg as directed every 30 (thirty) days. Inject on the 21st of each  month, Disp: , Rfl:    Dulaglutide  (TRULICITY ) 1.5 MG/0.5ML SOPN, INJECT 1.5MG  (1 PEN) UNDER THE SKIN EVERY 7 DAYS, Disp: 6 mL, Rfl: 3   empagliflozin  (JARDIANCE ) 25 MG TABS tablet, TAKE 1 TABLET EVERY DAY BEFORE BREAKFAST, Disp: 90 tablet, Rfl: 1   furosemide  (LASIX ) 20 MG tablet, TAKE 1 TABLET EVERY DAY, Disp: 90 tablet, Rfl: 2   glipiZIDE  (GLUCOTROL ) 5 MG  tablet, TAKE 1 TABLET TWICE DAILY BEFORE MEALS, Disp: 180 tablet, Rfl: 1   ibuprofen (ADVIL) 200 MG tablet, Take 400 mg by mouth every 6 (six) hours as needed for mild pain (pain score 1-3) or moderate pain (pain score 4-6) (low back, hip, and upper leg)., Disp: , Rfl:    isosorbide  mononitrate (IMDUR ) 60 MG 24 hr tablet, Take 1.5 tablets (90 mg total) by mouth daily., Disp: 135 tablet, Rfl: 3   losartan  (COZAAR ) 25 MG tablet, Take 0.5 tablets (12.5 mg total) by mouth daily., Disp: 90 tablet, Rfl: 3   metFORMIN  (GLUCOPHAGE ) 1000 MG tablet, TAKE 1 TABLET TWICE DAILY WITH FOOD, Disp: 180 tablet, Rfl: 1   nitroGLYCERIN  (NITROSTAT ) 0.4 MG SL tablet, Place 1 tablet (0.4 mg total) under the tongue every 5 (five) minutes as needed for chest pain., Disp: 25 tablet, Rfl: 3   REPATHA  SURECLICK 140 MG/ML SOAJ, INJECT 140MG  UNDER THE SKIN EVERY 2 WEEKS, Disp: 6 mL, Rfl: 3   rivaroxaban  (XARELTO ) 20 MG TABS tablet, TAKE 1 TABLET EVERY DAY WITH SUPPER, Disp: 90 tablet, Rfl: 1  Observations/Objective: Patient is well-developed, well-nourished in no acute distress.  Resting comfortably at home.  Head is normocephalic, atraumatic.  No labored breathing.  Speech is clear and coherent with logical content.  Patient is alert and oriented at baseline.  Mild erythema of forehead bilaterally with diffuse swelling. Some small papular lesions scattered of forehead and lower face/neck. No vesicular or pustular lesions noted. No periorbital edema or lip swelling.  Assessment and Plan: 1. Allergic reaction, initial encounter (Primary) - triamcinolone  cream (KENALOG ) 0.1 %; Apply 1 Application topically 2 (two) times daily.  Dispense: 30 g; Refill: 0 - hydrOXYzine  (ATARAX ) 10 MG tablet; Take 1 tablet (10 mg total) by mouth 3 (three) times daily as needed.  Dispense: 30 tablet; Refill: 0  Supportive measures and OTC medications reviewed. Unknown trigger -- she is to wash bed linens in hot water alone and dry before reuse.   Keep symptoms journal. May be beneficial to make sure the pets are bathed as a precautions. Start topical triamcinolone  to neck and forehead, avoid use anywhere else on the face. She does not like oral steroids, so will start low-dose Hydroxyzine  (giving age > 33) to help calm this reaction down.  Strict in-person follow-up precautions reviewed.  Follow Up Instructions: I discussed the assessment and treatment plan with the patient. The patient was provided an opportunity to ask questions and all were answered. The patient agreed with the plan and demonstrated an understanding of the instructions.  A copy of instructions were sent to the patient via MyChart unless otherwise noted below.   The patient was advised to call back or seek an in-person evaluation if the symptoms worsen or if the condition fails to improve as anticipated.    Michelle Velma Lunger, PA-C

## 2023-11-06 ENCOUNTER — Telehealth: Payer: Self-pay

## 2023-11-06 ENCOUNTER — Telehealth: Admitting: Physician Assistant

## 2023-11-06 DIAGNOSIS — T7840XD Allergy, unspecified, subsequent encounter: Secondary | ICD-10-CM | POA: Diagnosis not present

## 2023-11-06 MED ORDER — PREDNISONE 10 MG (21) PO TBPK: ORAL_TABLET | ORAL | 0 refills | Status: AC

## 2023-11-06 NOTE — Patient Instructions (Signed)
 [name], thank you for joining Michelle Velma Lunger, PA-C for today's virtual visit.  While this provider is not your primary care provider (PCP), if your PCP is located in our provider database this encounter information will be shared with them immediately following your visit.   A [facility] MyChart account gives you access to today's visit and all your visits, tests, and labs performed at [facility]  click here if you don't have a [facility] MyChart account or go to [email or web address]  Consent: (Patient) [name] provided verbal consent for this virtual visit at the beginning of the encounter.  Current Medications:  Current Outpatient Medications:    predniSONE  (STERAPRED UNI-PAK 21 TAB) 10 MG (21) TBPK tablet, Take following package directions, Disp: 21 tablet, Rfl: 0   acetaminophen  (TYLENOL ) 325 MG tablet, Take 325 mg by mouth as needed for mild pain (pain score 1-3) (low back, hip, and upper leg pain). (Patient not taking: Reported on 07/03/2023), Disp: , Rfl:    APPLE CIDER VINEGAR PO, Take 5,220 mg by mouth in the morning, at noon, and at bedtime. 1740 mg each, Disp: , Rfl:    carvedilol  (COREG ) 12.5 MG tablet, TAKE 2 TABLETS TWICE A DAY, Disp: 360 tablet, Rfl: 3   Cholecalciferol (VITAMIN D3) 50 MCG (2000 UT) capsule, TAKE 1 CAPSULE (2000 UNITS TOTAL) BY MOUTH DAILY., Disp: 90 capsule, Rfl: 3   Cyanocobalamin  2000 MCG/ML SOLN, Inject 1,000 mg as directed every 30 (thirty) days. Inject on the 21st of each  month, Disp: , Rfl:    Dulaglutide  (TRULICITY ) 1.5 MG/0.5ML SOPN, INJECT 1.5MG  (1 PEN) UNDER THE SKIN EVERY 7 DAYS, Disp: 6 mL, Rfl: 3   empagliflozin  (JARDIANCE ) 25 MG TABS tablet, TAKE 1 TABLET EVERY DAY BEFORE BREAKFAST, Disp: 90 tablet, Rfl: 1   furosemide  (LASIX ) 20 MG tablet, TAKE 1 TABLET EVERY DAY, Disp: 90 tablet, Rfl: 2   glipiZIDE  (GLUCOTROL ) 5 MG tablet, TAKE 1 TABLET TWICE DAILY BEFORE MEALS, Disp: 180 tablet, Rfl: 1   hydrOXYzine   (ATARAX ) 10 MG tablet, Take 1 tablet (10 mg total) by mouth 3 (three) times daily as needed., Disp: 30 tablet, Rfl: 0   ibuprofen (ADVIL) 200 MG tablet, Take 400 mg by mouth every 6 (six) hours as needed for mild pain (pain score 1-3) or moderate pain (pain score 4-6) (low back, hip, and upper leg)., Disp: , Rfl:    isosorbide  mononitrate (IMDUR ) 60 MG 24 hr tablet, Take 1.5 tablets (90 mg total) by mouth daily., Disp: 135 tablet, Rfl: 3   losartan  (COZAAR ) 25 MG tablet, Take 0.5 tablets (12.5 mg total) by mouth daily., Disp: 90 tablet, Rfl: 3   metFORMIN  (GLUCOPHAGE ) 1000 MG tablet, TAKE 1 TABLET TWICE DAILY WITH FOOD, Disp: 180 tablet, Rfl: 1   nitroGLYCERIN  (NITROSTAT ) 0.4 MG SL tablet, Place 1 tablet (0.4 mg total) under the tongue every 5 (five) minutes as needed for chest pain., Disp: 25 tablet, Rfl: 3   REPATHA  SURECLICK 140 MG/ML SOAJ, INJECT 140MG  UNDER THE SKIN EVERY 2 WEEKS, Disp: 6 mL, Rfl: 3   rivaroxaban  (XARELTO ) 20 MG TABS tablet, TAKE 1 TABLET EVERY DAY WITH SUPPER, Disp: 90 tablet, Rfl: 1   triamcinolone  cream (KENALOG ) 0.1 %, Apply 1 Application topically 2 (two) times daily., Disp: 30 g, Rfl: 0   Medications ordered in this encounter:  Meds ordered this encounter  Medications   predniSONE  (STERAPRED UNI-PAK 21 TAB) 10 MG (21) TBPK tablet    Sig: Take following package directions  Dispense:  21 tablet    Refill:  0    Supervising Provider:   BLAISE ALEENE KIDD [[id number]]     *If you need refills on other medications prior to your next appointment, please contact your pharmacy*  Follow-Up: Call back or seek an in-person evaluation if the symptoms worsen or if the condition fails to improve as anticipated.  [facility] [phone]  Other Instructions Please add on the prednisone , taking as directed. Continue care recommended yesterday as well. If you note any non-resolving, new, or worsening symptoms despite treatment, please seek an in-person evaluation  ASAP.    If you have been instructed to have an in-person evaluation today at a local Urgent Care facility, please use the link below. It will take you to a list of all of our available [facility], including address, phone number and hours of operation. Please do not delay care.  [facility]  If you or a family member do not have a primary care provider, use the link below to schedule a visit and establish care. When you choose a [facility] primary care physician or advanced practice provider, you gain a long-term partner in health. Find a Primary Care Provider  Learn more about [facility]'s in-office and virtual care options:  - Get Care Now

## 2023-11-06 NOTE — Telephone Encounter (Signed)
 Patient called, left VM to return the call to the office to speak to a nurse.    Copied from CRM #[id number]. Topic: Clinical - Red Word Triage >> Nov 06, 2023  3:18 PM Rea ORN wrote: Red Word that prompted transfer to Nurse Triage: pt had virtual visit with PA today and was prescribed prednisone . Because she just picked it up she is asking when she should start taking it.Pt stated that because she has diabetes, she knows this will raise her BG and wants to be for sure when to take it

## 2023-11-06 NOTE — Telephone Encounter (Signed)
 Attempted to call patient regarding concerns and instructions once message was sent to me from PCP office. No answer and no identification on VM. Will send MyChart message.  Since it is later in the day, normal we would have her start first thing in the morning. However, in her case it is for an allergic reaction, so we would want her to go ahead and start today. I have adjusted the normal taper instructions below to accommodate for this change:  Directions for 6 day taper: Day 1: 1 tablet at dinner and 2 at bedtime Day 2: 2 tablets  before breakfast, 2 after lunch, 1 after dinner and 2 at bedtime Day 3: 1 tab at each meal & 1 at bedtime Day 4: 1 tab at breakfast, 1 at lunch, 1 at bedtime Day 5: 1 tab at breakfast & 1 tab at bedtime Day 6: 1 tab at breakfast

## 2023-11-06 NOTE — Progress Notes (Signed)
 Virtual Visit Consent   [name], you are scheduled for a virtual visit with a [facility] provider today. Just as with appointments in the office, your consent must be obtained to participate. Your consent will be active for this visit and any virtual visit you may have with one of our providers in the next 365 days. If you have a MyChart account, a copy of this consent can be sent to you electronically.  As this is a virtual visit, video technology does not allow for your provider to perform a traditional examination. This may limit your provider's ability to fully assess your condition. If your provider identifies any concerns that need to be evaluated in person or the need to arrange testing (such as labs, EKG, etc.), we will make arrangements to do so. Although advances in technology are sophisticated, we cannot ensure that it will always work on either your end or our end. If the connection with a video visit is poor, the visit may have to be switched to a telephone visit. With either a video or telephone visit, we are not always able to ensure that we have a secure connection.  By engaging in this virtual visit, you consent to the provision of healthcare and authorize for your insurance to be billed (if applicable) for the services provided during this visit. Depending on your insurance coverage, you may receive a charge related to this service.  I need to obtain your verbal consent now. Are you willing to proceed with your visit today? [name] has provided verbal consent on 11/06/2023 for a virtual visit (video or telephone). [name], [place]  Date: 11/06/2023 8:43 AM   Virtual Visit via Video Note   I, [name], connected with  [name]  ([id number], October 20, 1950) on 11/06/23 at  8:15 AM EDT by a video-enabled telemedicine application and verified that I am speaking with the correct person using two identifiers.  Location: Patient: Virtual Visit Location  Patient: Home Provider: Virtual Visit Location Provider: Home Office   I discussed the limitations of evaluation and management by telemedicine and the availability of in person appointments. The patient expressed understanding and agreed to proceed.    History of Present Illness: [name] is a 73 y.o. who identifies as a female who was assigned female at birth, and is being seen today for follow-up for evaluation yesterday for allergic reaction of unspecified trigger. Notes taking the hydroxyzine  as directed which helped with itch considerably but the rash/swelling of forehead has continued to progress. Denies fever, chills, SOB. Has washed all bed linens as directed.    HPI: HPI  Problems:  Patient Active Problem List   Diagnosis Date Noted   Chest pain 05/01/2023   Vitamin B12 deficiency 09/17/2022   Statin myopathy 07/08/2022   History of colonic polyps 07/20/2021   Pacemaker battery depletion 04/17/2020   SOB (shortness of breath) 02/26/2018   Vitamin D  deficiency 02/22/2016   Current use of long term anticoagulation 11/10/2015   Type 2 diabetes mellitus with circulatory disorder, without long-term current use of insulin  (HCC) 10/03/2015   Chronic pain disorder 10/03/2015   Generalized osteoarthrosis, involving multiple sites 08/25/2015   Other fatigue 08/11/2015   Back pain, chronic 08/11/2015   Incisional hernia 05/25/2015   Incisional hernia, without obstruction or gangrene 05/04/2015   Exertional dyspnea 08/05/2014   Routine general medical examination at a health care facility 04/14/2014   Anxiety state 05/13/2013   Acute on chronic combined systolic and  diastolic CHF, NYHA class 3 (HCC) 03/19/2013   High anion gap metabolic acidosis 03/14/2013   Biventricular cardiac pacemaker - [employer] Jude, Nov 2014 03/04/2013   Chronic combined systolic and diastolic CHF, NYHA class 2 (HCC) 01/08/2013   S/P CABG x 4: 09/28/12 (LIMA-LAD, SVG-OM1-OM2, SVG-Intermediate) 09/29/2012    Right carotid bruit 08/11/2012   PAF (paroxysmal atrial fibrillation) (HCC) 07/21/2012   Ischemic cardiomyopathy    CAD (coronary artery disease)    Anogenital warts 01/22/2011   Hyperlipidemia 09/19/2009   Essential hypertension 09/19/2009   MYOCARDIAL INFARCTION, HX OF 09/19/2009   Allergic rhinitis 09/19/2009   BREAST CANCER, HX OF 09/19/2009    Allergies:  Allergies  Allergen Reactions   Statins Itching    Myalgias with rosuvastatin , atorvastatin  and simvastatin     Medications:  Current Outpatient Medications:    predniSONE  (STERAPRED UNI-PAK 21 TAB) 10 MG (21) TBPK tablet, Take following package directions, Disp: 21 tablet, Rfl: 0   acetaminophen  (TYLENOL ) 325 MG tablet, Take 325 mg by mouth as needed for mild pain (pain score 1-3) (low back, hip, and upper leg pain). (Patient not taking: Reported on 07/03/2023), Disp: , Rfl:    APPLE CIDER VINEGAR PO, Take 5,220 mg by mouth in the morning, at noon, and at bedtime. 1740 mg each, Disp: , Rfl:    carvedilol  (COREG ) 12.5 MG tablet, TAKE 2 TABLETS TWICE A DAY, Disp: 360 tablet, Rfl: 3   Cholecalciferol (VITAMIN D3) 50 MCG (2000 UT) capsule, TAKE 1 CAPSULE (2000 UNITS TOTAL) BY MOUTH DAILY., Disp: 90 capsule, Rfl: 3   Cyanocobalamin  2000 MCG/ML SOLN, Inject 1,000 mg as directed every 30 (thirty) days. Inject on the 21st of each  month, Disp: , Rfl:    Dulaglutide  (TRULICITY ) 1.5 MG/0.5ML SOPN, INJECT 1.5MG  (1 PEN) UNDER THE SKIN EVERY 7 DAYS, Disp: 6 mL, Rfl: 3   empagliflozin  (JARDIANCE ) 25 MG TABS tablet, TAKE 1 TABLET EVERY DAY BEFORE BREAKFAST, Disp: 90 tablet, Rfl: 1   furosemide  (LASIX ) 20 MG tablet, TAKE 1 TABLET EVERY DAY, Disp: 90 tablet, Rfl: 2   glipiZIDE  (GLUCOTROL ) 5 MG tablet, TAKE 1 TABLET TWICE DAILY BEFORE MEALS, Disp: 180 tablet, Rfl: 1   hydrOXYzine  (ATARAX ) 10 MG tablet, Take 1 tablet (10 mg total) by mouth 3 (three) times daily as needed., Disp: 30 tablet, Rfl: 0   ibuprofen (ADVIL) 200 MG tablet, Take 400 mg by  mouth every 6 (six) hours as needed for mild pain (pain score 1-3) or moderate pain (pain score 4-6) (low back, hip, and upper leg)., Disp: , Rfl:    isosorbide  mononitrate (IMDUR ) 60 MG 24 hr tablet, Take 1.5 tablets (90 mg total) by mouth daily., Disp: 135 tablet, Rfl: 3   losartan  (COZAAR ) 25 MG tablet, Take 0.5 tablets (12.5 mg total) by mouth daily., Disp: 90 tablet, Rfl: 3   metFORMIN  (GLUCOPHAGE ) 1000 MG tablet, TAKE 1 TABLET TWICE DAILY WITH FOOD, Disp: 180 tablet, Rfl: 1   nitroGLYCERIN  (NITROSTAT ) 0.4 MG SL tablet, Place 1 tablet (0.4 mg total) under the tongue every 5 (five) minutes as needed for chest pain., Disp: 25 tablet, Rfl: 3   REPATHA  SURECLICK 140 MG/ML SOAJ, INJECT 140MG  UNDER THE SKIN EVERY 2 WEEKS, Disp: 6 mL, Rfl: 3   rivaroxaban  (XARELTO ) 20 MG TABS tablet, TAKE 1 TABLET EVERY DAY WITH SUPPER, Disp: 90 tablet, Rfl: 1   triamcinolone  cream (KENALOG ) 0.1 %, Apply 1 Application topically 2 (two) times daily., Disp: 30 g, Rfl: 0  Observations/Objective: Patient is well-developed, well-nourished  in no acute distress.  Resting comfortably  at home.  Head is normocephalic, atraumatic.  No labored breathing.  Speech is clear and coherent with logical content.  Patient is alert and oriented at baseline.  Continued diffuse swelling of forehead and left infraorbital region with erythematous rash noted bilaterally.   Assessment and Plan: 1. Allergic reaction, subsequent encounter (Primary) - predniSONE  (STERAPRED UNI-PAK 21 TAB) 10 MG (21) TBPK tablet; Take following package directions  Dispense: 21 tablet; Refill: 0  Continue care as directed yesterday. She is now agreeable to short course of oral steroid. Prednisone  pack per orders. Discussed if not quickly leveling out and improving or any progressive symptoms, she needs in-person evaluation ASAP.  Follow Up Instructions: I discussed the assessment and treatment plan with the patient. The patient was provided an opportunity  to ask questions and all were answered. The patient agreed with the plan and demonstrated an understanding of the instructions.  A copy of instructions were sent to the patient via MyChart unless otherwise noted below.    The patient was advised to call back or seek an in-person evaluation if the symptoms worsen or if the condition fails to improve as anticipated.    Michelle Velma Lunger, PA-C

## 2023-11-07 ENCOUNTER — Other Ambulatory Visit: Payer: Self-pay | Admitting: Cardiovascular Disease

## 2023-11-07 DIAGNOSIS — I480 Paroxysmal atrial fibrillation: Secondary | ICD-10-CM

## 2023-11-07 NOTE — Telephone Encounter (Signed)
 Prescription refill request for Xarelto  received.  Indication:afib Last office visit:4/25 Weight:68.9  kg Age:73 Scr:0.95  2/25 CrCl:59.08  ml/min  Prescription refilled

## 2023-11-12 ENCOUNTER — Ambulatory Visit (INDEPENDENT_AMBULATORY_CARE_PROVIDER_SITE_OTHER): Admitting: Audiology

## 2023-11-12 ENCOUNTER — Encounter (INDEPENDENT_AMBULATORY_CARE_PROVIDER_SITE_OTHER): Payer: Self-pay | Admitting: Otolaryngology

## 2023-11-12 ENCOUNTER — Ambulatory Visit (INDEPENDENT_AMBULATORY_CARE_PROVIDER_SITE_OTHER): Admitting: Otolaryngology

## 2023-11-12 VITALS — BP 113/67 | HR 88 | Ht 64.0 in | Wt 150.0 lb

## 2023-11-12 DIAGNOSIS — H903 Sensorineural hearing loss, bilateral: Secondary | ICD-10-CM

## 2023-11-12 NOTE — Progress Notes (Signed)
  [address] [place], [place] [address] [phone]  Audiological Evaluation    Name: [name]     DOB:   Nov 10, 1950      MRN:   [id number]                                                                                     Service Date: 11/12/2023     Accompanied by: unaccompanied   Patient comes today after Dr. Tobie, ENT sent a referral for a hearing evaluation due to concerns with hearing loss.   Symptoms Yes Details  Hearing loss  [x]  Some difficulty understanding in presence of background noise  Tinnitus  []    Ear pain/ infections/pressure  []    Balance problems  [x]  Sometimes has vertigo ( spinning sensation)  Noise exposure history  []    Previous ear surgeries  []    Family history of hearing loss  []    Amplification  []    Other  []      Otoscopy: Right ear: Clear external ear canal and notable landmarks visualized on the tympanic membrane. Left ear:  Clear external ear canal and notable landmarks visualized on the tympanic membrane.  Tympanometry: Right ear: Type A- Normal external ear canal volume with normal middle ear pressure and tympanic membrane compliance. Left ear: Type A- Normal external ear canal volume with normal middle ear pressure and tympanic membrane compliance.  Pure tone Audiometry: Both ears- Normal sloping to severe sensorineural hearing loss from 125 Hz - 8000 Hz.    Speech Audiometry: Right ear- Speech Reception Threshold (SRT) was obtained at 25 dBHL. Left ear-Speech Reception Threshold (SRT) was obtained at 20 dBHL.   Word Recognition Score Tested using NU-6 (recorded) Right ear: 100% was obtained at a presentation level of 70 dBHL with contralateral masking which is deemed as  excellent. Left ear: 100% was obtained at a presentation level of 70 dBHL with contralateral masking which is deemed as  excellent.   The hearing test results were completed under headphones and results are deemed to be of good to fair  reliability. Test technique:  conventional     Recommendations: Follow up with ENT as scheduled for today. Return for a hearing evaluation if concerns with hearing changes arise or per MD recommendation. Consider a communication needs assessment after medical clearance for hearing aids is obtained.   Jevaeh Shams MARIE LEROUX-MARTINEZ, AUD

## 2023-11-12 NOTE — Progress Notes (Signed)
 Dear Dr. Theophilus Andrews, Here is my assessment for our mutual patient, [name]. Thank you for allowing me the opportunity to care for your patient. Please do not hesitate to contact me should you have any other questions. Sincerely, Dr. Eldora Blanch  Otolaryngology Clinic Note Referring provider: Dr. Theophilus Andrews HPI:  [name] is a 73 y.o. female kindly referred by Dr. Theophilus Andrews for evaluation of hearing loss  Initial visit (11/2023): She reports that she has bilateral hearing difficulty when she is in group settings/background noise. Family has also noticed it. Hearing is getting worse, slow decline. No sudden hearing loss. Denies history of significant occupational or recreational noise exposure. No FHX of early onset hearing loss Patient denies: ear pain, fullness, vertigo, drainage Patient additionally denies: deep pain in ear canal, eustachian tube symptoms such as popping/crackling, sensitive to pressure changes Patient also denies barotrauma, vestibular suppressant use, ototoxic medication use Prior ear surgery: no Ears generally not a problem  H&N Surgery: no Personal or FHx of bleeding dz or anesthesia difficulty: no  AP/AC: Xarelto   Tobacco: former, quit approx 15 years ago  PMHx: CAD s/p CABG, PAF, CHF (s/p CRT-P), T2DM, HLD, HTN, mild OSA  Independent Review of Additional Tests or Records:  Dr. Theophilus Andrews (06/2023): noted possible hearing issues. She reports hearing loss; Dx: Hearing loss; Rx: ref to ENT Labs CBC and BMP 04/30/2023: BUN/Cr 21/0.95; WBC 6.6, Hgb 13.2, Plt 220 CTH 10/12/2021 independently interpreted with respect to ears: cuts thick so suboptimal but mastoids and ME well aerated; no otic capsule or ossicular chain pathology appreciated 11/12/2023 Audiogram was independently reviewed and interpreted by me and it reveals - normal downsloping to sev symmetric SNHL AU; A/A tymps; WRT 100% AU at 70dB HL  SNHL= Sensorineural hearing  loss  PMH/Meds/All/SocHx/FamHx/ROS:   Past Medical History:  Diagnosis Date   Acute on chronic combined systolic and diastolic CHF, NYHA class 3 (HCC) 03/19/2013   AICD (automatic cardioverter/defibrillator) present 01/2013   Biventricular cardiac pacemaker in situ    Allergic rhinitis    Anogenital warts 01/22/2011   Anxiety state, unspecified 05/13/2013   BREAST CANCER, HX OF 09/19/2009   Qualifier: Diagnosis of  By: Krystal MD, Reyes A    CAD (coronary artery disease)    a. 2004: s/p MI in [place] . No PCI->Medical RX;  b. 07/2012 Cath: LM 30-40, LAD 70p, 70/64m, D1 80-90p, OM1 small 90p, OM2 large 80-90p, 83m, 70-80d, RCA 20-30 diff, EF 40%, glob HK.s/p CABG   Cancer of left breast (HCC) 2002   Patient reports left breast cancer diagnosis in 2002 treated with bilateral mastectomy positive lymph nodes with left axillary dissection followed by chemotherapy of unknown type   Cataract    Chronic combined systolic and diastolic CHF, NYHA class 2 (HCC) 01/08/2013   Diabetes mellitus type II    Exertional dyspnea 08/05/2014   Exertional shortness of breath    Generalized osteoarthrosis, involving multiple sites 08/25/2015   History of colon polyps    Hyperlipidemia    Hypertension    Incisional hernia, without obstruction or gangrene 05/04/2015   Ischemic cardiomyopathy    a. 07/2012 Echo: EF 35%, Sev inferoseptal HK, mildly dil LA, Peak PASP .   LBBB (left bundle branch block)    a. intermittent - present during rapid afib 07/2012.   MYOCARDIAL INFARCTION, HX OF 09/19/2009   Qualifier: Diagnosis of  By: Krystal MD, Reyes LABOR    Neuromuscular disorder [facility])    Patient reports chronic numbness in  the right foot related to previous surgery on the right leg and nerve damage   PAF (paroxysmal atrial fibrillation) (HCC)    a. 07/2012: Amio and xarelto  initiated.   Right carotid bruit 08/11/2012   SBO (small bowel obstruction) (HCC)    SOB (shortness of breath) 02/26/2018   Type 2  diabetes mellitus with circulatory disorder, without long-term current use of insulin  (HCC) 10/03/2015   Vitamin D  deficiency 02/22/2016     Past Surgical History:  Procedure Laterality Date   ABDOMINAL HYSTERECTOMY  2000   APPENDECTOMY  1974   BI-VENTRICULAR PACEMAKER INSERTION N/A 01/25/2013   Procedure: BI-VENTRICULAR PACEMAKER INSERTION (CRT-P);  Surgeon: Danelle LELON Birmingham, MD;  Location: [facility] CATH LAB;  Service: Cardiovascular;  Laterality: N/A;   BREAST BIOPSY Left 2002   CARDIAC CATHETERIZATION     CHOLECYSTECTOMY OPEN  1974   CORONARY ARTERY BYPASS GRAFT N/A 09/28/2012   Procedure: CORONARY ARTERY BYPASS GRAFTING (CABG);  Surgeon: Dallas KATHEE Jude, MD;  Location: [facility] OR;  Service: Open Heart Surgery;  Laterality: N/A;  CABG x four, using left internal mammary artery and left leg greater saphenous vein harvested endoscopically   DILATION AND CURETTAGE OF UTERUS  1983   EPICARDIAL PACING LEAD PLACEMENT N/A 09/28/2012   Procedure: EPICARDIAL PACING LEAD PLACEMENT;  Surgeon: Dallas KATHEE Jude, MD;  Location: [facility] OR;  Service: Thoracic;  Laterality: N/A;  LV LEAD PLACEMENT   HERNIA REPAIR     INCISIONAL HERNIA REPAIR N/A 05/25/2015   Procedure: LAPAROSCOPIC INCISIONAL HERNIA WITH MESH ;  Surgeon: Donnice Bury, MD;  Location: [facility] OR;  Service: General;  Laterality: N/A;   INCONTINENCE SURGERY  2000   INSERTION OF MESH N/A 05/25/2015   Procedure: INSERTION OF MESH;  Surgeon: Donnice Bury, MD;  Location: [facility] OR;  Service: General;  Laterality: N/A;   INTRAOPERATIVE TRANSESOPHAGEAL ECHOCARDIOGRAM N/A 09/28/2012   Procedure: INTRAOPERATIVE TRANSESOPHAGEAL ECHOCARDIOGRAM;  Surgeon: Dallas KATHEE Jude, MD;  Location: [facility] OR;  Service: Open Heart Surgery;  Laterality: N/A;   LAPAROSCOPIC INCISIONAL / UMBILICAL / VENTRAL HERNIA REPAIR  05/25/2015   IHR   LEFT HEART CATHETERIZATION WITH CORONARY ANGIOGRAM N/A 07/20/2012   Procedure: LEFT HEART CATHETERIZATION WITH CORONARY ANGIOGRAM;  Surgeon: Peter M  Swaziland, MD;  Location: [facility] CATH LAB;  Service: Cardiovascular;  Laterality: N/A;   MASTECTOMY Right 2002   MASTECTOMY MODIFIED RADICAL W/ AXILLARY LYMPH NODES W/ OR W/O PECTORALIS MINOR Left 2002   MAZE N/A 09/28/2012   Procedure: MAZE;  Surgeon: Dallas KATHEE Jude, MD;  Location: [facility] OR;  Service: Open Heart Surgery;  Laterality: N/A;   ORIF WRIST FRACTURE Right 11/08/2017   Procedure: OPEN REDUCTION INTERNAL FIXATION (ORIF) WRIST FRACTURE;  Surgeon: Camella Fallow, MD;  Location: [facility] OR;  Service: Orthopedics;  Laterality: Right;  90 mins   PPM GENERATOR CHANGEOUT N/A 04/17/2020   Procedure: PPM GENERATOR CHANGEOUT;  Surgeon: Francyne Headland, MD;  Location: [facility] INVASIVE CV LAB;  Service: Cardiovascular;  Laterality: N/A;   TENDON REPAIR Right 2001 X 3-4   torn ligaments and tendons in ankle up to knee from work related accident   TUBAL LIGATION  1987    Family History  Problem Relation Age of Onset   Kidney failure Mother    Heart disease Mother        Died mid 31s complications of diabetes   Diabetes Mother    Colon cancer Sister    Arthritis Other    Diabetes Other    Hyperlipidemia Other  Ovarian cancer Other    Liver cancer Brother    Stomach cancer Neg Hx    Rectal cancer Neg Hx    Esophageal cancer Neg Hx    Pancreatic cancer Neg Hx      Social Connections: Socially Integrated (05/01/2023)   Social Connection and Isolation Panel    Frequency of Communication with Friends and Family: More than three times a week    Frequency of Social Gatherings with Friends and Family: More than three times a week    Attends Religious Services: More than 4 times per year    Active Member of [employer] or Organizations: Yes    Attends Engineer, structural: More than 4 times per year    Marital Status: Married      Current Outpatient Medications:    APPLE CIDER VINEGAR PO, Take 5,220 mg by mouth in the morning, at noon, and at bedtime. 1740 mg each, Disp: , Rfl:    carvedilol  (COREG )  12.5 MG tablet, TAKE 2 TABLETS TWICE A DAY, Disp: 360 tablet, Rfl: 3   Cyanocobalamin  2000 MCG/ML SOLN, Inject 1,000 mg as directed every 30 (thirty) days. Inject on the 21st of each  month, Disp: , Rfl:    Dulaglutide  (TRULICITY ) 1.5 MG/0.5ML SOPN, INJECT 1.5MG  (1 PEN) UNDER THE SKIN EVERY 7 DAYS, Disp: 6 mL, Rfl: 3   empagliflozin  (JARDIANCE ) 25 MG TABS tablet, TAKE 1 TABLET EVERY DAY BEFORE BREAKFAST, Disp: 90 tablet, Rfl: 1   furosemide  (LASIX ) 20 MG tablet, TAKE 1 TABLET EVERY DAY, Disp: 90 tablet, Rfl: 2   glipiZIDE  (GLUCOTROL ) 5 MG tablet, TAKE 1 TABLET TWICE DAILY BEFORE MEALS, Disp: 180 tablet, Rfl: 1   hydrOXYzine  (ATARAX ) 10 MG tablet, Take 1 tablet (10 mg total) by mouth 3 (three) times daily as needed., Disp: 30 tablet, Rfl: 0   ibuprofen (ADVIL) 200 MG tablet, Take 400 mg by mouth every 6 (six) hours as needed for mild pain (pain score 1-3) or moderate pain (pain score 4-6) (low back, hip, and upper leg)., Disp: , Rfl:    isosorbide  mononitrate (IMDUR ) 60 MG 24 hr tablet, Take 1.5 tablets (90 mg total) by mouth daily., Disp: 135 tablet, Rfl: 3   losartan  (COZAAR ) 25 MG tablet, Take 0.5 tablets (12.5 mg total) by mouth daily., Disp: 90 tablet, Rfl: 3   metFORMIN  (GLUCOPHAGE ) 1000 MG tablet, TAKE 1 TABLET TWICE DAILY WITH FOOD, Disp: 180 tablet, Rfl: 1   nitroGLYCERIN  (NITROSTAT ) 0.4 MG SL tablet, Place 1 tablet (0.4 mg total) under the tongue every 5 (five) minutes as needed for chest pain., Disp: 25 tablet, Rfl: 3   predniSONE  (STERAPRED UNI-PAK 21 TAB) 10 MG (21) TBPK tablet, Take following package directions, Disp: 21 tablet, Rfl: 0   REPATHA  SURECLICK 140 MG/ML SOAJ, INJECT 140MG  UNDER THE SKIN EVERY 2 WEEKS, Disp: 6 mL, Rfl: 3   triamcinolone  cream (KENALOG ) 0.1 %, Apply 1 Application topically 2 (two) times daily., Disp: 30 g, Rfl: 0   XARELTO  20 MG TABS tablet, TAKE 1 TABLET EVERY DAY WITH SUPPER, Disp: 90 tablet, Rfl: 3   acetaminophen  (TYLENOL ) 325 MG tablet, Take 325 mg by  mouth as needed for mild pain (pain score 1-3) (low back, hip, and upper leg pain). (Patient not taking: Reported on 11/12/2023), Disp: , Rfl:    Cholecalciferol (VITAMIN D3) 50 MCG (2000 UT) capsule, TAKE 1 CAPSULE (2000 UNITS TOTAL) BY MOUTH DAILY. (Patient not taking: Reported on 11/12/2023), Disp: 90 capsule, Rfl: 3   Physical  Exam:   BP 113/67 (BP Location: Right Arm, Patient Position: Sitting, Cuff Size: Normal)   Pulse 88   Ht 5' 4 (1.626 m)   Wt 150 lb (68 kg)   SpO2 96%   BMI 25.75 kg/m   Salient findings:  CN II-XII intact Bilateral EAC clear and TM intact with well pneumatized middle ear spaces Weber 512: mid Rinne 512: AC > BC b/l  Anterior rhinoscopy: Septum intact; bilateral inferior turbinates without significant hypertrophy No lesions of oral cavity/oropharynx No obviously palpable neck masses/lymphadenopathy/thyromegaly No respiratory distress or stridor  Seprately Identifiable Procedures:  Prior to initiating any procedures, risks/benefits/alternatives were explained to the patient and verbal consent obtained. None    Impression & Plans:  [name] is a 73 y.o. female with:  1. Sensorineural hearing loss (SNHL) of both ears    Chronic, now worsening SNHL. Exam and findings consistent with presbycusis. No tinnitus. We discussed options including amplification - provided resources/places. She reports she will think about it F/u in 1 year with audio with Chyrl, sooner as needed  See below regarding exact medications prescribed this encounter including dosages and route: No orders of the defined types were placed in this encounter.     Thank you for allowing me the opportunity to care for your patient. Please do not hesitate to contact me should you have any other questions.  Sincerely, Eldora Blanch, MD Otolaryngologist (ENT), [facility] Phone: [phone] Fax: [phone]  11/12/2023, 10:50 AM   MDM:  Level 4 -  [id number] Complexity/Problems addressed: mod - chronic worsening problem Data complexity: mod - independent review of notes, labs, ordering test, independent CT interpretation - Morbidity: low  - Prescription Drug prescribed or managed: no

## 2023-11-14 ENCOUNTER — Encounter: Payer: Self-pay | Admitting: Audiology

## 2023-11-19 ENCOUNTER — Other Ambulatory Visit: Payer: Self-pay | Admitting: Internal Medicine

## 2023-11-19 DIAGNOSIS — E1159 Type 2 diabetes mellitus with other circulatory complications: Secondary | ICD-10-CM

## 2023-12-01 ENCOUNTER — Other Ambulatory Visit (HOSPITAL_COMMUNITY)

## 2023-12-06 NOTE — Progress Notes (Signed)
 Remote PPM Transmission

## 2023-12-11 ENCOUNTER — Other Ambulatory Visit

## 2023-12-11 DIAGNOSIS — Z006 Encounter for examination for normal comparison and control in clinical research program: Secondary | ICD-10-CM

## 2023-12-19 LAB — GENECONNECT MOLECULAR SCREEN: Genetic Analysis Overall Interpretation: NEGATIVE

## 2023-12-25 ENCOUNTER — Encounter: Payer: Self-pay | Admitting: Cardiovascular Disease

## 2024-01-09 ENCOUNTER — Telehealth: Payer: Self-pay | Admitting: Internal Medicine

## 2024-01-09 NOTE — Telephone Encounter (Signed)
 Lmom for pt to callback to sch fup on her diabetes

## 2024-01-09 NOTE — Telephone Encounter (Signed)
-----   Message from Jon VEAR Lindau sent at 01/09/2024  4:08 PM EDT ----- Hello,  Patient is past due for diabetes follow up with PCP. Can someone reach out to her to try and schedule an appt by end of year so she does not fail several quality metrics?  Thank you! Jon VEAR Lindau, PharmD Clinical Pharmacist [phone]

## 2024-01-14 DIAGNOSIS — I2510 Atherosclerotic heart disease of native coronary artery without angina pectoris: Secondary | ICD-10-CM | POA: Diagnosis not present

## 2024-01-14 DIAGNOSIS — Z7985 Long-term (current) use of injectable non-insulin antidiabetic drugs: Secondary | ICD-10-CM | POA: Diagnosis not present

## 2024-01-14 DIAGNOSIS — I110 Hypertensive heart disease with heart failure: Secondary | ICD-10-CM | POA: Diagnosis not present

## 2024-01-14 DIAGNOSIS — R32 Unspecified urinary incontinence: Secondary | ICD-10-CM | POA: Diagnosis not present

## 2024-01-14 DIAGNOSIS — I509 Heart failure, unspecified: Secondary | ICD-10-CM | POA: Diagnosis not present

## 2024-01-14 DIAGNOSIS — Z833 Family history of diabetes mellitus: Secondary | ICD-10-CM | POA: Diagnosis not present

## 2024-01-14 DIAGNOSIS — D6869 Other thrombophilia: Secondary | ICD-10-CM | POA: Diagnosis not present

## 2024-01-14 DIAGNOSIS — E785 Hyperlipidemia, unspecified: Secondary | ICD-10-CM | POA: Diagnosis not present

## 2024-01-14 DIAGNOSIS — E119 Type 2 diabetes mellitus without complications: Secondary | ICD-10-CM | POA: Diagnosis not present

## 2024-01-14 DIAGNOSIS — Z5982 Transportation insecurity: Secondary | ICD-10-CM | POA: Diagnosis not present

## 2024-01-14 DIAGNOSIS — Z7901 Long term (current) use of anticoagulants: Secondary | ICD-10-CM | POA: Diagnosis not present

## 2024-01-14 DIAGNOSIS — I4891 Unspecified atrial fibrillation: Secondary | ICD-10-CM | POA: Diagnosis not present

## 2024-01-15 ENCOUNTER — Ambulatory Visit: Payer: Medicare HMO

## 2024-01-15 DIAGNOSIS — I480 Paroxysmal atrial fibrillation: Secondary | ICD-10-CM

## 2024-01-15 LAB — CUP PACEART REMOTE DEVICE CHECK
Battery Remaining Longevity: 37 mo
Battery Remaining Percentage: 45 %
Battery Voltage: 2.96 V
Brady Statistic AP VP Percent: 1 %
Brady Statistic AP VS Percent: 1 %
Brady Statistic AS VP Percent: 99 %
Brady Statistic AS VS Percent: 1 %
Brady Statistic RA Percent Paced: 1 %
Date Time Interrogation Session: 20251106030014
Implantable Lead Connection Status: 753985
Implantable Lead Connection Status: 753985
Implantable Lead Connection Status: 753985
Implantable Lead Implant Date: 20140721
Implantable Lead Implant Date: 20141117
Implantable Lead Implant Date: 20141117
Implantable Lead Location: 753858
Implantable Lead Location: 753859
Implantable Lead Location: 753860
Implantable Lead Model: 5071
Implantable Pulse Generator Implant Date: 20220207
Lead Channel Impedance Value: 390 Ohm
Lead Channel Impedance Value: 400 Ohm
Lead Channel Impedance Value: 530 Ohm
Lead Channel Pacing Threshold Amplitude: 0.5 V
Lead Channel Pacing Threshold Amplitude: 1 V
Lead Channel Pacing Threshold Amplitude: 1.25 V
Lead Channel Pacing Threshold Pulse Width: 0.4 ms
Lead Channel Pacing Threshold Pulse Width: 0.4 ms
Lead Channel Pacing Threshold Pulse Width: 0.5 ms
Lead Channel Sensing Intrinsic Amplitude: 0.9 mV
Lead Channel Sensing Intrinsic Amplitude: 8.4 mV
Lead Channel Setting Pacing Amplitude: 2 V
Lead Channel Setting Pacing Amplitude: 2.5 V
Lead Channel Setting Pacing Amplitude: 2.5 V
Lead Channel Setting Pacing Pulse Width: 0.4 ms
Lead Channel Setting Pacing Pulse Width: 0.5 ms
Lead Channel Setting Sensing Sensitivity: 2 mV
Pulse Gen Model: 3222
Pulse Gen Serial Number: 3859407

## 2024-01-16 NOTE — Progress Notes (Signed)
 Remote PPM Transmission

## 2024-01-17 ENCOUNTER — Ambulatory Visit: Payer: Self-pay | Admitting: Cardiovascular Disease

## 2024-01-19 ENCOUNTER — Other Ambulatory Visit: Payer: Self-pay | Admitting: Internal Medicine

## 2024-01-19 DIAGNOSIS — E1159 Type 2 diabetes mellitus with other circulatory complications: Secondary | ICD-10-CM

## 2024-01-21 ENCOUNTER — Ambulatory Visit: Admitting: Internal Medicine

## 2024-01-21 DIAGNOSIS — E1159 Type 2 diabetes mellitus with other circulatory complications: Secondary | ICD-10-CM

## 2024-02-16 ENCOUNTER — Other Ambulatory Visit: Payer: Self-pay | Admitting: Family Medicine

## 2024-02-16 ENCOUNTER — Telehealth: Payer: Self-pay

## 2024-02-16 ENCOUNTER — Ambulatory Visit: Payer: Self-pay | Admitting: *Deleted

## 2024-02-16 ENCOUNTER — Encounter: Payer: Self-pay | Admitting: Family Medicine

## 2024-02-16 ENCOUNTER — Ambulatory Visit: Admitting: Family Medicine

## 2024-02-16 ENCOUNTER — Telehealth: Payer: Self-pay | Admitting: *Deleted

## 2024-02-16 VITALS — BP 100/58 | HR 96 | Temp 98.3°F | Ht 64.0 in | Wt 151.2 lb

## 2024-02-16 DIAGNOSIS — J029 Acute pharyngitis, unspecified: Secondary | ICD-10-CM

## 2024-02-16 DIAGNOSIS — R509 Fever, unspecified: Secondary | ICD-10-CM

## 2024-02-16 DIAGNOSIS — R059 Cough, unspecified: Secondary | ICD-10-CM

## 2024-02-16 DIAGNOSIS — U071 COVID-19: Secondary | ICD-10-CM

## 2024-02-16 LAB — POCT INFLUENZA A/B
Influenza A, POC: NEGATIVE
Influenza B, POC: NEGATIVE

## 2024-02-16 LAB — POCT RAPID STREP A (OFFICE): Rapid Strep A Screen: NEGATIVE

## 2024-02-16 LAB — POC COVID19 BINAXNOW: SARS Coronavirus 2 Ag: POSITIVE — AB

## 2024-02-16 MED ORDER — NIRMATRELVIR/RITONAVIR (PAXLOVID)TABLET: 3.0000 | ORAL_TABLET | Freq: Two times a day (BID) | ORAL | 0 refills | Status: DC

## 2024-02-16 MED ORDER — MOLNUPIRAVIR EUA 200MG CAPSULE: 4.0000 | ORAL_CAPSULE | Freq: Two times a day (BID) | ORAL | 0 refills | Status: DC

## 2024-02-16 NOTE — Telephone Encounter (Signed)
 2nd attempt. No answer. Left voicemail for patient to return call to speak with nurse triage. PAS scheduled patient for 11am acute visit with Dr Johnny.

## 2024-02-16 NOTE — Telephone Encounter (Signed)
 Copied from CRM #[id number]. Topic: Clinical - Prescription Issue >> Feb 16, 2024 11:52 AM Carlyon D wrote: Reason for CRM: Pt is calling in regards to medication molnupiravir  EUA (LAGEVRIO ) 200 mg CAPS capsule. She was prescribed today, pt husband just went to get the medication and insurance is not covering it out of pocket cost is over 500$. Pt is asking if something else can be sent that insurance will cover.  Please call pt back with status update        Call back # [phone]

## 2024-02-16 NOTE — Telephone Encounter (Signed)
 Copied from CRM [id number]. Topic: Clinical - Medication Prior Auth >> Feb 16, 2024  2:40 PM Drema MATSU wrote: Reason for CRM: Vince with [employer] is needing a prior auth for molnupiravir  EUA (LAGEVRIO ) 200 mg CAPS capsule.   Reference # [id number]

## 2024-02-16 NOTE — Progress Notes (Signed)
 Done

## 2024-02-16 NOTE — Telephone Encounter (Signed)
 This RN made third attempt with no answer. A voicemail was left with call back number provided.

## 2024-02-16 NOTE — Telephone Encounter (Signed)
 Call disconnected during transfer from PAS. NT called [phone] to review sx of fever, chills, cough, chest pain, body aches, sneezing. No answer, LVMTCB [phone].    Copied from CRM #[id number]. Topic: Clinical - Red Word Triage >> Feb 16, 2024  8:14 AM [name] wrote: [facility] that prompted transfer to Nurse Triage: Patient is having the following symptoms:  - Fever  - Chills -Chest pain from coughing and sneezing - Body Aches - Coughing/Sneezing - Pain Top to Bottom  **Looking to schedule appt** **Transf to NT**

## 2024-02-16 NOTE — Telephone Encounter (Signed)
 Copied from CRM [id number]. Topic: Clinical - Prescription Issue >> Feb 16, 2024 12:31 PM Ashley R wrote: Reason for CRM: Fax submitting this morning from insurance for New Molnupiravir  800 mg Oral 2 times daily to be covered. Needs submitted with answers for rx to proceed

## 2024-02-16 NOTE — Telephone Encounter (Signed)
 I cancelled the Molnupiravir  and instead sent in Paxlovid 

## 2024-02-16 NOTE — Telephone Encounter (Signed)
 Patient was seen in the office 02/16/24.

## 2024-02-16 NOTE — Telephone Encounter (Signed)
 Please see other encounter.

## 2024-02-16 NOTE — Progress Notes (Signed)
   Subjective:    Patient ID: [name], female    DOB: Apr 01, 1950, 73 y.o.   MRN: [id number]  HPI Here for the onset last night of fever, a dry cough, and body aches. No ST. Taking Tylenol .  No SOB.   Review of Systems  Constitutional:  Positive for fever.  HENT: Negative.    Eyes: Negative.   Respiratory:  Positive for cough. Negative for shortness of breath and wheezing.   Gastrointestinal: Negative.        Objective:   Physical Exam Constitutional:      Appearance: Normal appearance.  HENT:     Right Ear: Tympanic membrane, ear canal and external ear normal.     Left Ear: Tympanic membrane, ear canal and external ear normal.     Nose: Nose normal.     Mouth/Throat:     Pharynx: Oropharynx is clear.  Eyes:     Conjunctiva/sclera: Conjunctivae normal.  Pulmonary:     Effort: Pulmonary effort is normal.     Breath sounds: Normal breath sounds.  Lymphadenopathy:     Cervical: No cervical adenopathy.  Neurological:     Mental Status: She is alert.           Assessment & Plan:  Covid infection, treat with 5 days of Molnupiravir .  Garnette Olmsted, MD

## 2024-02-17 ENCOUNTER — Telehealth: Payer: Self-pay | Admitting: Cardiovascular Disease

## 2024-02-17 ENCOUNTER — Telehealth: Payer: Self-pay | Admitting: *Deleted

## 2024-02-17 ENCOUNTER — Encounter: Payer: Self-pay | Admitting: Cardiovascular Disease

## 2024-02-17 NOTE — Telephone Encounter (Signed)
 Pt c/o medication issue:  1. Name of Medication: Paxlovid    2. How are you currently taking this medication (dosage and times per day)?   3. Are you having a reaction (difficulty breathing--STAT)?   4. What is your medication issue?   Patient stated she was diagnosed with COVID and wants to know if she can take this medication.

## 2024-02-17 NOTE — Telephone Encounter (Signed)
 Message was sent to pt's mychart.

## 2024-02-17 NOTE — Telephone Encounter (Signed)
 See my other answer this morning

## 2024-02-17 NOTE — Telephone Encounter (Signed)
 Patient has opted not to take medication

## 2024-02-17 NOTE — Telephone Encounter (Signed)
 Copied from CRM #[id number]. Topic: Clinical - Prescription Issue >> Feb 17, 2024 11:13 AM Alfonso HERO wrote: Reason for CRM: Covid medication sent to pharmacy was denies by insurance and costs too much for the patient. She would like to know paxlovid  what problems she may come into if she takes this medication. She is asking for a call back from Dr. Johnny. She is not feeling any better today and needs something she can take.

## 2024-02-17 NOTE — Telephone Encounter (Signed)
 Left message for patient informing her we are forwarding her message to our pharmacy team to review and that we will follow-up with her when they respond. Provided office number for callback if any questions.

## 2024-02-17 NOTE — Telephone Encounter (Signed)
 Since we are having so much trouble getting the Molnupiravir  covered by her insurance, I have decided that she probably doesn't need to take either of the antiviral medications. They are of limited benefit and can cause side effects, so let's just cancel the order. The infection should run its course, but she can follow up if needed

## 2024-02-17 NOTE — Telephone Encounter (Signed)
 Can you help with PA on Molnupiravir ?

## 2024-02-18 ENCOUNTER — Other Ambulatory Visit (HOSPITAL_COMMUNITY): Payer: Self-pay

## 2024-02-18 MED ORDER — AZITHROMYCIN 250 MG PO TABS: ORAL_TABLET | ORAL | 0 refills | Status: AC

## 2024-02-18 NOTE — Telephone Encounter (Signed)
 Yes that would be fine. I sent in a RX for a Zpack for her to take

## 2024-02-18 NOTE — Addendum Note (Signed)
 Addended by: JOHNNY SENIOR A on: 02/18/2024 07:27 AM   Modules accepted: Orders

## 2024-02-23 ENCOUNTER — Telehealth: Payer: Self-pay | Admitting: Internal Medicine

## 2024-02-23 ENCOUNTER — Ambulatory Visit

## 2024-02-23 NOTE — Telephone Encounter (Signed)
 Pt called in upset and wanting to speak wit the Print Production Planner. I handled the call and pt stated that she was not only upset that her appt today with the wellness nurse was canceled last minute but she has been unhappy for the last 6 mths.   Pt also stated that when she calls in and waiting she is waiting for 15 mins. (She timed the call) Then when she gets transferred to a nurse they are hanging up and she would have to call back again and go through the whole process. She really does not like calling in because she feels like the ppl don't care about pt health anymore.   Lastly, pt is upset that Dr's are taking a while before responding back via message or myChart. Stated that she feels like the Dr's here don't give a S@%t about her health and all they are worrying about is taking pt's copay's.   Pt stated that she will be calling the patient experience line and letting them know how she feels.   FYI.

## 2024-02-24 ENCOUNTER — Ambulatory Visit: Admitting: Internal Medicine

## 2024-02-24 ENCOUNTER — Encounter: Payer: Self-pay | Admitting: Internal Medicine

## 2024-02-24 DIAGNOSIS — U071 COVID-19: Secondary | ICD-10-CM

## 2024-02-24 NOTE — Progress Notes (Signed)
 Established Patient Office Visit     CC/Reason for Visit: Follow-up COVID  HPI: [name] is a 73 y.o. female who is coming in today for the above mentioned reasons.  Was seen in the office on December 8 and diagnosed with COVID.  She elected not to take Paxlovid .  She is feeling 100% better.  She just wanted to come in for a checkup.   Past Medical/Surgical History: Past Medical History:  Diagnosis Date   Acute on chronic combined systolic and diastolic CHF, NYHA class 3 (HCC) 03/19/2013   AICD (automatic cardioverter/defibrillator) present 01/2013   Biventricular cardiac pacemaker in situ    Allergic rhinitis    Anogenital warts 01/22/2011   Anxiety state, unspecified 05/13/2013   BREAST CANCER, HX OF 09/19/2009   Qualifier: Diagnosis of  By: Krystal MD, Reyes LABOR    CAD (coronary artery disease)    a. 2004: s/p MI in [place] . No PCI->Medical RX;  b. 07/2012 Cath: LM 30-40, LAD 70p, 70/57m, D1 80-90p, OM1 small 90p, OM2 large 80-90p, 97m, 70-80d, RCA 20-30 diff, EF 40%, glob HK.s/p CABG   Cancer of left breast (HCC) 2002   Patient reports left breast cancer diagnosis in 2002 treated with bilateral mastectomy positive lymph nodes with left axillary dissection followed by chemotherapy of unknown type   Cataract    Chronic combined systolic and diastolic CHF, NYHA class 2 (HCC) 01/08/2013   Diabetes mellitus type II    Exertional dyspnea 08/05/2014   Exertional shortness of breath    Generalized osteoarthrosis, involving multiple sites 08/25/2015   History of colon polyps    Hyperlipidemia    Hypertension    Incisional hernia, without obstruction or gangrene 05/04/2015   Ischemic cardiomyopathy    a. 07/2012 Echo: EF 35%, Sev inferoseptal HK, mildly dil LA, Peak PASP .   LBBB (left bundle branch block)    a. intermittent - present during rapid afib 07/2012.   MYOCARDIAL INFARCTION, HX OF 09/19/2009   Qualifier: Diagnosis of  By: Krystal MD, Reyes LABOR    Neuromuscular  disorder [facility])    Patient reports chronic numbness in the right foot related to previous surgery on the right leg and nerve damage   PAF (paroxysmal atrial fibrillation) (HCC)    a. 07/2012: Amio and xarelto  initiated.   Right carotid bruit 08/11/2012   SBO (small bowel obstruction) (HCC)    SOB (shortness of breath) 02/26/2018   Type 2 diabetes mellitus with circulatory disorder, without long-term current use of insulin  (HCC) 10/03/2015   Vitamin D  deficiency 02/22/2016    Past Surgical History:  Procedure Laterality Date   ABDOMINAL HYSTERECTOMY  2000   APPENDECTOMY  1974   BI-VENTRICULAR PACEMAKER INSERTION N/A 01/25/2013   Procedure: BI-VENTRICULAR PACEMAKER INSERTION (CRT-P);  Surgeon: Danelle LELON Birmingham, MD;  Location: [facility] CATH LAB;  Service: Cardiovascular;  Laterality: N/A;   BREAST BIOPSY Left 2002   CARDIAC CATHETERIZATION     CHOLECYSTECTOMY OPEN  1974   CORONARY ARTERY BYPASS GRAFT N/A 09/28/2012   Procedure: CORONARY ARTERY BYPASS GRAFTING (CABG);  Surgeon: Dallas KATHEE Jude, MD;  Location: [facility] OR;  Service: Open Heart Surgery;  Laterality: N/A;  CABG x four, using left internal mammary artery and left leg greater saphenous vein harvested endoscopically   DILATION AND CURETTAGE OF UTERUS  1983   EPICARDIAL PACING LEAD PLACEMENT N/A 09/28/2012   Procedure: EPICARDIAL PACING LEAD PLACEMENT;  Surgeon: Dallas KATHEE Jude, MD;  Location: [facility] OR;  Service: Thoracic;  Laterality: N/A;  LV LEAD PLACEMENT   HERNIA REPAIR     INCISIONAL HERNIA REPAIR N/A 05/25/2015   Procedure: LAPAROSCOPIC INCISIONAL HERNIA WITH MESH ;  Surgeon: Donnice Bury, MD;  Location: [facility] OR;  Service: General;  Laterality: N/A;   INCONTINENCE SURGERY  2000   INSERTION OF MESH N/A 05/25/2015   Procedure: INSERTION OF MESH;  Surgeon: Donnice Bury, MD;  Location: [facility] OR;  Service: General;  Laterality: N/A;   INTRAOPERATIVE TRANSESOPHAGEAL ECHOCARDIOGRAM N/A 09/28/2012   Procedure: INTRAOPERATIVE TRANSESOPHAGEAL  ECHOCARDIOGRAM;  Surgeon: Dallas KATHEE Jude, MD;  Location: [facility] OR;  Service: Open Heart Surgery;  Laterality: N/A;   LAPAROSCOPIC INCISIONAL / UMBILICAL / VENTRAL HERNIA REPAIR  05/25/2015   IHR   LEFT HEART CATHETERIZATION WITH CORONARY ANGIOGRAM N/A 07/20/2012   Procedure: LEFT HEART CATHETERIZATION WITH CORONARY ANGIOGRAM;  Surgeon: Peter M Jordan, MD;  Location: [facility] CATH LAB;  Service: Cardiovascular;  Laterality: N/A;   MASTECTOMY Right 2002   MASTECTOMY MODIFIED RADICAL W/ AXILLARY LYMPH NODES W/ OR W/O PECTORALIS MINOR Left 2002   MAZE N/A 09/28/2012   Procedure: MAZE;  Surgeon: Dallas KATHEE Jude, MD;  Location: [facility] OR;  Service: Open Heart Surgery;  Laterality: N/A;   ORIF WRIST FRACTURE Right 11/08/2017   Procedure: OPEN REDUCTION INTERNAL FIXATION (ORIF) WRIST FRACTURE;  Surgeon: Camella Fallow, MD;  Location: [facility] OR;  Service: Orthopedics;  Laterality: Right;  90 mins   PPM GENERATOR CHANGEOUT N/A 04/17/2020   Procedure: PPM GENERATOR CHANGEOUT;  Surgeon: Francyne Headland, MD;  Location: [facility] INVASIVE CV LAB;  Service: Cardiovascular;  Laterality: N/A;   TENDON REPAIR Right 2001 X 3-4   torn ligaments and tendons in ankle up to knee from work related accident   TUBAL LIGATION  1987    Social History:  reports that she quit smoking about 11 years ago. Her smoking use included cigarettes. She started smoking about 21 years ago. She has a 10 pack-year smoking history. She has never used smokeless tobacco. She reports that she does not drink alcohol and does not use drugs.  Allergies: Allergies[1]  Family History:  Family History  Problem Relation Age of Onset   Kidney failure Mother    Heart disease Mother        Died mid 37s complications of diabetes   Diabetes Mother    Colon cancer Sister    Arthritis Other    Diabetes Other    Hyperlipidemia Other    Ovarian cancer Other    Liver cancer Brother    Stomach cancer Neg Hx    Rectal cancer Neg Hx    Esophageal cancer Neg Hx     Pancreatic cancer Neg Hx     Current Medications[2]  Review of Systems:  Negative unless indicated in HPI.   Physical Exam: There were no vitals filed for this visit.  There is no height or weight on file to calculate BMI.   Physical Exam Vitals reviewed.  Constitutional:      Appearance: Normal appearance.  HENT:     Right Ear: Tympanic membrane, ear canal and external ear normal.     Left Ear: Tympanic membrane, ear canal and external ear normal.     Mouth/Throat:     Mouth: Mucous membranes are moist.     Pharynx: Oropharynx is clear.  Eyes:     Conjunctiva/sclera: Conjunctivae normal.  Cardiovascular:     Rate and Rhythm: Normal rate and regular rhythm.  Pulmonary:     Effort: Pulmonary effort  is normal.     Breath sounds: Normal breath sounds.  Neurological:     Mental Status: She is alert.      Impression and Plan:  COVID-19 virus infection  -Has recovered well, no lingering effects.  No further need for treatment.   Time spent:21 minutes reviewing chart, interviewing and examining patient and formulating plan of care.     Tully Theophilus Andrews, MD [facility]     [1]  Allergies Allergen Reactions   Statins Itching    Myalgias with rosuvastatin , atorvastatin  and simvastatin    [2]  Current Outpatient Medications:    acetaminophen  (TYLENOL ) 325 MG tablet, Take 325 mg by mouth as needed for mild pain (pain score 1-3) (low back, hip, and upper leg pain)., Disp: , Rfl:    APPLE CIDER VINEGAR PO, Take 5,220 mg by mouth in the morning, at noon, and at bedtime. 1740 mg each, Disp: , Rfl:    carvedilol  (COREG ) 12.5 MG tablet, TAKE 2 TABLETS TWICE A DAY, Disp: 360 tablet, Rfl: 3   Dulaglutide  (TRULICITY ) 1.5 MG/0.5ML SOAJ, INJECT 1.5MG  (1 PEN) UNDER THE SKIN EVERY 7 DAYS, Disp: 6 mL, Rfl: 3   empagliflozin  (JARDIANCE ) 25 MG TABS tablet, TAKE 1 TABLET EVERY DAY BEFORE BREAKFAST, Disp: 90 tablet, Rfl: 0   furosemide  (LASIX ) 20 MG  tablet, TAKE 1 TABLET EVERY DAY, Disp: 90 tablet, Rfl: 2   glipiZIDE  (GLUCOTROL ) 5 MG tablet, TAKE 1 TABLET TWICE DAILY BEFORE MEALS, Disp: 180 tablet, Rfl: 0   hydrOXYzine  (ATARAX ) 10 MG tablet, Take 1 tablet (10 mg total) by mouth 3 (three) times daily as needed., Disp: 30 tablet, Rfl: 0   ibuprofen (ADVIL) 200 MG tablet, Take 400 mg by mouth every 6 (six) hours as needed for mild pain (pain score 1-3) or moderate pain (pain score 4-6) (low back, hip, and upper leg)., Disp: , Rfl:    isosorbide  mononitrate (IMDUR ) 60 MG 24 hr tablet, Take 1.5 tablets (90 mg total) by mouth daily., Disp: 135 tablet, Rfl: 3   losartan  (COZAAR ) 25 MG tablet, Take 0.5 tablets (12.5 mg total) by mouth daily., Disp: 90 tablet, Rfl: 3   metFORMIN  (GLUCOPHAGE ) 1000 MG tablet, TAKE 1 TABLET TWICE DAILY WITH FOOD, Disp: 180 tablet, Rfl: 0   nitroGLYCERIN  (NITROSTAT ) 0.4 MG SL tablet, Place 1 tablet (0.4 mg total) under the tongue every 5 (five) minutes as needed for chest pain., Disp: 25 tablet, Rfl: 3   predniSONE  (STERAPRED UNI-PAK 21 TAB) 10 MG (21) TBPK tablet, Take following package directions, Disp: 21 tablet, Rfl: 0   REPATHA  SURECLICK 140 MG/ML SOAJ, INJECT 140MG  UNDER THE SKIN EVERY 2 WEEKS, Disp: 6 mL, Rfl: 3   triamcinolone  cream (KENALOG ) 0.1 %, Apply 1 Application topically 2 (two) times daily., Disp: 30 g, Rfl: 0   XARELTO  20 MG TABS tablet, TAKE 1 TABLET EVERY DAY WITH SUPPER, Disp: 90 tablet, Rfl: 3

## 2024-02-25 ENCOUNTER — Telehealth: Payer: Self-pay | Admitting: *Deleted

## 2024-02-25 NOTE — Telephone Encounter (Signed)
 Copied from CRM #[id number]. Topic: Appointments - Transfer of Care >> Feb 25, 2024 12:25 PM [name] wrote: Pt is requesting to transfer FROM: [facility] [place], Dr.Estela Hernandez Pt is requesting to transfer TO: [facility], [name] Reason for requested transfer: looking for new PCP It is the responsibility of the team the patient would like to transfer to (Dr. Alvia) to reach out to the patient if for any reason this transfer is not acceptable.

## 2024-02-27 ENCOUNTER — Ambulatory Visit

## 2024-03-03 ENCOUNTER — Telehealth: Admitting: Physician Assistant

## 2024-03-03 DIAGNOSIS — R110 Nausea: Secondary | ICD-10-CM

## 2024-03-03 MED ORDER — ONDANSETRON HCL 4 MG PO TABS: 4.0000 mg | ORAL_TABLET | Freq: Three times a day (TID) | ORAL | 0 refills | Status: AC | PRN

## 2024-03-03 NOTE — Progress Notes (Signed)

## 2024-03-06 ENCOUNTER — Other Ambulatory Visit: Payer: Self-pay | Admitting: Cardiovascular Disease

## 2024-04-01 ENCOUNTER — Other Ambulatory Visit: Payer: Self-pay | Admitting: Cardiology

## 2024-04-01 ENCOUNTER — Other Ambulatory Visit: Payer: Self-pay | Admitting: Internal Medicine

## 2024-04-01 DIAGNOSIS — E1159 Type 2 diabetes mellitus with other circulatory complications: Secondary | ICD-10-CM

## 2024-04-01 DIAGNOSIS — I10 Essential (primary) hypertension: Secondary | ICD-10-CM

## 2024-04-02 LAB — LAB REPORT - SCANNED
A1c: 6.4
Albumin, Urine POC: 0.5
Creatinine, POC: 59 mg/dL
Microalb Creat Ratio: 8

## 2024-04-06 ENCOUNTER — Encounter: Payer: Self-pay | Admitting: Internal Medicine

## 2024-04-08 NOTE — Telephone Encounter (Signed)
 In accordance with refill protocols, please review and address the following requirements before this medication refill can be authorized:  Labs

## 2024-04-15 ENCOUNTER — Ambulatory Visit: Payer: Medicare HMO

## 2024-04-16 LAB — CUP PACEART REMOTE DEVICE CHECK
Battery Remaining Longevity: 34 mo
Battery Remaining Percentage: 41 %
Battery Voltage: 2.96 V
Brady Statistic AP VP Percent: 1 %
Brady Statistic AP VS Percent: 1 %
Brady Statistic AS VP Percent: 99 %
Brady Statistic AS VS Percent: 1 %
Brady Statistic RA Percent Paced: 1 %
Date Time Interrogation Session: 20260205040014
Implantable Lead Connection Status: 753985
Implantable Lead Connection Status: 753985
Implantable Lead Connection Status: 753985
Implantable Lead Implant Date: 20140721
Implantable Lead Implant Date: 20141117
Implantable Lead Implant Date: 20141117
Implantable Lead Location: 753858
Implantable Lead Location: 753859
Implantable Lead Location: 753860
Implantable Lead Model: 5071
Implantable Pulse Generator Implant Date: 20220207
Lead Channel Impedance Value: 390 Ohm
Lead Channel Impedance Value: 390 Ohm
Lead Channel Impedance Value: 490 Ohm
Lead Channel Pacing Threshold Amplitude: 0.5 V
Lead Channel Pacing Threshold Amplitude: 1 V
Lead Channel Pacing Threshold Amplitude: 1.25 V
Lead Channel Pacing Threshold Pulse Width: 0.4 ms
Lead Channel Pacing Threshold Pulse Width: 0.4 ms
Lead Channel Pacing Threshold Pulse Width: 0.5 ms
Lead Channel Sensing Intrinsic Amplitude: 0.5 mV
Lead Channel Sensing Intrinsic Amplitude: 8.4 mV
Lead Channel Setting Pacing Amplitude: 2 V
Lead Channel Setting Pacing Amplitude: 2.5 V
Lead Channel Setting Pacing Amplitude: 2.5 V
Lead Channel Setting Pacing Pulse Width: 0.4 ms
Lead Channel Setting Pacing Pulse Width: 0.5 ms
Lead Channel Setting Sensing Sensitivity: 2 mV
Pulse Gen Model: 3222
Pulse Gen Serial Number: 3859407

## 2024-07-05 ENCOUNTER — Encounter: Admitting: Family Medicine
